# Patient Record
Sex: Female | Born: 1940 | Race: Black or African American | Hispanic: No | Marital: Married | Smoking: Never smoker
Health system: Southern US, Community
[De-identification: ages and names within clinical notes are randomized; demographics above are authoritative.]

## PROBLEM LIST (undated history)

## (undated) DIAGNOSIS — K5792 Diverticulitis of intestine, part unspecified, without perforation or abscess without bleeding: Secondary | ICD-10-CM

## (undated) DIAGNOSIS — I10 Essential (primary) hypertension: Secondary | ICD-10-CM

## (undated) DIAGNOSIS — Z8489 Family history of other specified conditions: Secondary | ICD-10-CM

## (undated) DIAGNOSIS — K219 Gastro-esophageal reflux disease without esophagitis: Secondary | ICD-10-CM

## (undated) DIAGNOSIS — G4733 Obstructive sleep apnea (adult) (pediatric): Secondary | ICD-10-CM

## (undated) DIAGNOSIS — T7840XA Allergy, unspecified, initial encounter: Secondary | ICD-10-CM

## (undated) DIAGNOSIS — D429 Neoplasm of uncertain behavior of meninges, unspecified: Secondary | ICD-10-CM

## (undated) DIAGNOSIS — H269 Unspecified cataract: Secondary | ICD-10-CM

## (undated) DIAGNOSIS — R739 Hyperglycemia, unspecified: Secondary | ICD-10-CM

## (undated) DIAGNOSIS — Z800 Family history of malignant neoplasm of digestive organs: Secondary | ICD-10-CM

## (undated) DIAGNOSIS — R002 Palpitations: Secondary | ICD-10-CM

## (undated) DIAGNOSIS — E0590 Thyrotoxicosis, unspecified without thyrotoxic crisis or storm: Secondary | ICD-10-CM

## (undated) DIAGNOSIS — E785 Hyperlipidemia, unspecified: Secondary | ICD-10-CM

## (undated) DIAGNOSIS — D420 Neoplasm of uncertain behavior of cerebral meninges: Secondary | ICD-10-CM

## (undated) DIAGNOSIS — R42 Dizziness and giddiness: Secondary | ICD-10-CM

## (undated) DIAGNOSIS — Z8719 Personal history of other diseases of the digestive system: Secondary | ICD-10-CM

## (undated) DIAGNOSIS — M1990 Unspecified osteoarthritis, unspecified site: Secondary | ICD-10-CM

## (undated) DIAGNOSIS — K279 Peptic ulcer, site unspecified, unspecified as acute or chronic, without hemorrhage or perforation: Secondary | ICD-10-CM

## (undated) DIAGNOSIS — F329 Major depressive disorder, single episode, unspecified: Secondary | ICD-10-CM

## (undated) DIAGNOSIS — R569 Unspecified convulsions: Secondary | ICD-10-CM

## (undated) DIAGNOSIS — G4730 Sleep apnea, unspecified: Secondary | ICD-10-CM

## (undated) DIAGNOSIS — F32A Depression, unspecified: Secondary | ICD-10-CM

## (undated) DIAGNOSIS — K635 Polyp of colon: Secondary | ICD-10-CM

## (undated) DIAGNOSIS — J189 Pneumonia, unspecified organism: Secondary | ICD-10-CM

## (undated) DIAGNOSIS — D421 Neoplasm of uncertain behavior of spinal meninges: Secondary | ICD-10-CM

## (undated) DIAGNOSIS — F419 Anxiety disorder, unspecified: Secondary | ICD-10-CM

## (undated) DIAGNOSIS — Z8041 Family history of malignant neoplasm of ovary: Secondary | ICD-10-CM

## (undated) DIAGNOSIS — R350 Frequency of micturition: Secondary | ICD-10-CM

## (undated) HISTORY — PX: ABDOMINAL HYSTERECTOMY: SHX81

## (undated) HISTORY — PX: CATARACT EXTRACTION: SUR2

## (undated) HISTORY — DX: Depression, unspecified: F32.A

## (undated) HISTORY — DX: Allergy, unspecified, initial encounter: T78.40XA

## (undated) HISTORY — PX: BACK SURGERY: SHX140

## (undated) HISTORY — DX: Neoplasm of uncertain behavior of spinal meninges: D42.1

## (undated) HISTORY — DX: Neoplasm of uncertain behavior of cerebral meninges: D42.0

## (undated) HISTORY — DX: Hyperlipidemia, unspecified: E78.5

## (undated) HISTORY — PX: WRIST SURGERY: SHX841

## (undated) HISTORY — DX: Neoplasm of uncertain behavior of meninges, unspecified: D42.9

## (undated) HISTORY — DX: Family history of malignant neoplasm of ovary: Z80.41

## (undated) HISTORY — DX: Unspecified cataract: H26.9

## (undated) HISTORY — DX: Family history of malignant neoplasm of digestive organs: Z80.0

## (undated) HISTORY — DX: Sleep apnea, unspecified: G47.30

## (undated) HISTORY — PX: TUBAL LIGATION: SHX77

## (undated) HISTORY — DX: Gastro-esophageal reflux disease without esophagitis: K21.9

## (undated) HISTORY — DX: Hyperglycemia, unspecified: R73.9

## (undated) HISTORY — DX: Essential (primary) hypertension: I10

## (undated) HISTORY — DX: Peptic ulcer, site unspecified, unspecified as acute or chronic, without hemorrhage or perforation: K27.9

## (undated) HISTORY — PX: DILATION AND CURETTAGE OF UTERUS: SHX78

## (undated) HISTORY — DX: Polyp of colon: K63.5

## (undated) HISTORY — DX: Obstructive sleep apnea (adult) (pediatric): G47.33

## (undated) HISTORY — DX: Major depressive disorder, single episode, unspecified: F32.9

## (undated) HISTORY — DX: Palpitations: R00.2

## (undated) HISTORY — PX: TONSILLECTOMY: SUR1361

---

## 1997-09-17 ENCOUNTER — Ambulatory Visit (HOSPITAL_COMMUNITY): Admission: RE | Admit: 1997-09-17 | Discharge: 1997-09-17 | Payer: Self-pay

## 1998-09-20 ENCOUNTER — Encounter: Payer: Self-pay | Admitting: Obstetrics and Gynecology

## 1998-09-20 ENCOUNTER — Ambulatory Visit (HOSPITAL_COMMUNITY): Admission: RE | Admit: 1998-09-20 | Discharge: 1998-09-20 | Payer: Self-pay | Admitting: Obstetrics and Gynecology

## 1999-03-19 ENCOUNTER — Ambulatory Visit (HOSPITAL_COMMUNITY): Admission: RE | Admit: 1999-03-19 | Discharge: 1999-03-19 | Payer: Self-pay | Admitting: Orthopedic Surgery

## 1999-03-19 ENCOUNTER — Encounter: Payer: Self-pay | Admitting: Orthopedic Surgery

## 1999-04-17 ENCOUNTER — Encounter: Admission: RE | Admit: 1999-04-17 | Discharge: 1999-04-17 | Payer: Self-pay | Admitting: Neurosurgery

## 1999-09-22 ENCOUNTER — Encounter: Payer: Self-pay | Admitting: Obstetrics and Gynecology

## 1999-09-22 ENCOUNTER — Ambulatory Visit (HOSPITAL_COMMUNITY): Admission: RE | Admit: 1999-09-22 | Discharge: 1999-09-22 | Payer: Self-pay | Admitting: Internal Medicine

## 2000-05-17 ENCOUNTER — Ambulatory Visit (HOSPITAL_COMMUNITY): Admission: RE | Admit: 2000-05-17 | Discharge: 2000-05-17 | Payer: Self-pay | Admitting: Orthopedic Surgery

## 2000-05-17 ENCOUNTER — Encounter: Payer: Self-pay | Admitting: Orthopedic Surgery

## 2000-06-20 ENCOUNTER — Encounter: Admission: RE | Admit: 2000-06-20 | Discharge: 2000-08-21 | Payer: Self-pay | Admitting: Orthopedic Surgery

## 2000-08-22 ENCOUNTER — Encounter: Admission: RE | Admit: 2000-08-22 | Discharge: 2000-09-16 | Payer: Self-pay | Admitting: Orthopedic Surgery

## 2000-09-09 ENCOUNTER — Encounter: Payer: Self-pay | Admitting: Orthopedic Surgery

## 2000-09-09 ENCOUNTER — Ambulatory Visit (HOSPITAL_COMMUNITY): Admission: RE | Admit: 2000-09-09 | Discharge: 2000-09-09 | Payer: Self-pay | Admitting: Orthopedic Surgery

## 2000-09-24 ENCOUNTER — Ambulatory Visit (HOSPITAL_COMMUNITY): Admission: RE | Admit: 2000-09-24 | Discharge: 2000-09-24 | Payer: Self-pay | Admitting: Obstetrics and Gynecology

## 2000-09-24 ENCOUNTER — Encounter: Payer: Self-pay | Admitting: Obstetrics and Gynecology

## 2000-10-01 ENCOUNTER — Encounter: Admission: RE | Admit: 2000-10-01 | Discharge: 2000-10-01 | Payer: Self-pay | Admitting: Neurosurgery

## 2000-12-01 ENCOUNTER — Ambulatory Visit (HOSPITAL_COMMUNITY): Admission: RE | Admit: 2000-12-01 | Discharge: 2000-12-01 | Payer: Self-pay | Admitting: Neurosurgery

## 2001-01-02 ENCOUNTER — Inpatient Hospital Stay (HOSPITAL_COMMUNITY): Admission: RE | Admit: 2001-01-02 | Discharge: 2001-01-06 | Payer: Self-pay | Admitting: Neurosurgery

## 2001-08-13 ENCOUNTER — Encounter: Admission: RE | Admit: 2001-08-13 | Discharge: 2001-10-21 | Payer: Self-pay

## 2001-08-28 ENCOUNTER — Observation Stay (HOSPITAL_COMMUNITY): Admission: RE | Admit: 2001-08-28 | Discharge: 2001-08-29 | Payer: Self-pay | Admitting: Neurosurgery

## 2001-09-26 ENCOUNTER — Encounter: Payer: Self-pay | Admitting: Obstetrics and Gynecology

## 2001-09-26 ENCOUNTER — Ambulatory Visit (HOSPITAL_COMMUNITY): Admission: RE | Admit: 2001-09-26 | Discharge: 2001-09-26 | Payer: Self-pay | Admitting: Obstetrics and Gynecology

## 2002-09-30 ENCOUNTER — Encounter: Payer: Self-pay | Admitting: Obstetrics and Gynecology

## 2002-09-30 ENCOUNTER — Ambulatory Visit (HOSPITAL_COMMUNITY): Admission: RE | Admit: 2002-09-30 | Discharge: 2002-09-30 | Payer: Self-pay | Admitting: Obstetrics and Gynecology

## 2002-10-05 ENCOUNTER — Emergency Department (HOSPITAL_COMMUNITY): Admission: EM | Admit: 2002-10-05 | Discharge: 2002-10-05 | Payer: Self-pay | Admitting: Emergency Medicine

## 2002-10-26 ENCOUNTER — Encounter: Payer: Self-pay | Admitting: Cardiovascular Disease

## 2002-10-26 ENCOUNTER — Ambulatory Visit (HOSPITAL_COMMUNITY): Admission: RE | Admit: 2002-10-26 | Discharge: 2002-10-26 | Payer: Self-pay | Admitting: Cardiovascular Disease

## 2003-09-16 ENCOUNTER — Encounter: Admission: RE | Admit: 2003-09-16 | Discharge: 2003-09-16 | Payer: Self-pay | Admitting: Family Medicine

## 2003-10-01 ENCOUNTER — Ambulatory Visit (HOSPITAL_COMMUNITY): Admission: RE | Admit: 2003-10-01 | Discharge: 2003-10-01 | Payer: Self-pay | Admitting: Family Medicine

## 2004-11-03 ENCOUNTER — Ambulatory Visit (HOSPITAL_COMMUNITY): Admission: RE | Admit: 2004-11-03 | Discharge: 2004-11-03 | Payer: Self-pay | Admitting: Family Medicine

## 2005-05-31 ENCOUNTER — Ambulatory Visit: Payer: Self-pay | Admitting: Emergency Medicine

## 2005-06-06 ENCOUNTER — Ambulatory Visit: Payer: Self-pay | Admitting: Emergency Medicine

## 2005-06-22 ENCOUNTER — Ambulatory Visit: Payer: Self-pay | Admitting: Emergency Medicine

## 2005-07-11 ENCOUNTER — Ambulatory Visit (HOSPITAL_BASED_OUTPATIENT_CLINIC_OR_DEPARTMENT_OTHER): Admission: RE | Admit: 2005-07-11 | Discharge: 2005-07-11 | Payer: Self-pay | Admitting: Emergency Medicine

## 2005-07-26 ENCOUNTER — Ambulatory Visit: Payer: Self-pay | Admitting: Pulmonary Disease

## 2005-08-06 ENCOUNTER — Ambulatory Visit: Payer: Self-pay | Admitting: Emergency Medicine

## 2005-08-27 ENCOUNTER — Encounter: Admission: RE | Admit: 2005-08-27 | Discharge: 2005-09-07 | Payer: Self-pay | Admitting: Family Medicine

## 2005-09-04 ENCOUNTER — Ambulatory Visit (HOSPITAL_BASED_OUTPATIENT_CLINIC_OR_DEPARTMENT_OTHER): Admission: RE | Admit: 2005-09-04 | Discharge: 2005-09-04 | Payer: Self-pay | Admitting: Emergency Medicine

## 2005-09-12 ENCOUNTER — Ambulatory Visit: Payer: Self-pay | Admitting: Pulmonary Disease

## 2005-10-05 ENCOUNTER — Ambulatory Visit: Payer: Self-pay | Admitting: Emergency Medicine

## 2005-11-05 ENCOUNTER — Ambulatory Visit (HOSPITAL_COMMUNITY): Admission: RE | Admit: 2005-11-05 | Discharge: 2005-11-05 | Payer: Self-pay | Admitting: Family Medicine

## 2005-11-14 ENCOUNTER — Emergency Department (HOSPITAL_COMMUNITY): Admission: EM | Admit: 2005-11-14 | Discharge: 2005-11-14 | Payer: Self-pay | Admitting: Emergency Medicine

## 2005-11-20 ENCOUNTER — Ambulatory Visit (HOSPITAL_COMMUNITY): Admission: RE | Admit: 2005-11-20 | Discharge: 2005-11-20 | Payer: Self-pay | Admitting: Orthopedic Surgery

## 2006-01-29 ENCOUNTER — Encounter: Admission: RE | Admit: 2006-01-29 | Discharge: 2006-04-29 | Payer: Self-pay | Admitting: Orthopedic Surgery

## 2006-05-16 ENCOUNTER — Ambulatory Visit: Payer: Self-pay | Admitting: Emergency Medicine

## 2006-10-08 ENCOUNTER — Encounter: Admission: RE | Admit: 2006-10-08 | Discharge: 2006-10-08 | Payer: Self-pay | Admitting: Family Medicine

## 2006-10-30 ENCOUNTER — Encounter: Admission: RE | Admit: 2006-10-30 | Discharge: 2006-10-30 | Payer: Self-pay | Admitting: Family Medicine

## 2006-11-08 ENCOUNTER — Ambulatory Visit (HOSPITAL_COMMUNITY): Admission: RE | Admit: 2006-11-08 | Discharge: 2006-11-08 | Payer: Self-pay | Admitting: Family Medicine

## 2006-11-08 DIAGNOSIS — G4733 Obstructive sleep apnea (adult) (pediatric): Secondary | ICD-10-CM | POA: Insufficient documentation

## 2006-11-08 DIAGNOSIS — R000 Tachycardia, unspecified: Secondary | ICD-10-CM | POA: Insufficient documentation

## 2006-11-08 DIAGNOSIS — J309 Allergic rhinitis, unspecified: Secondary | ICD-10-CM | POA: Insufficient documentation

## 2006-11-08 DIAGNOSIS — J383 Other diseases of vocal cords: Secondary | ICD-10-CM | POA: Insufficient documentation

## 2006-11-08 DIAGNOSIS — E785 Hyperlipidemia, unspecified: Secondary | ICD-10-CM | POA: Insufficient documentation

## 2006-11-08 DIAGNOSIS — I10 Essential (primary) hypertension: Secondary | ICD-10-CM | POA: Insufficient documentation

## 2006-11-08 DIAGNOSIS — J45909 Unspecified asthma, uncomplicated: Secondary | ICD-10-CM | POA: Insufficient documentation

## 2007-02-17 ENCOUNTER — Encounter: Admission: RE | Admit: 2007-02-17 | Discharge: 2007-02-17 | Payer: Self-pay | Admitting: Neurosurgery

## 2007-06-26 ENCOUNTER — Encounter: Admission: RE | Admit: 2007-06-26 | Discharge: 2007-06-26 | Payer: Self-pay | Admitting: Neurosurgery

## 2007-11-10 ENCOUNTER — Encounter: Payer: Self-pay | Admitting: Family Medicine

## 2007-11-10 ENCOUNTER — Ambulatory Visit (HOSPITAL_COMMUNITY): Admission: RE | Admit: 2007-11-10 | Discharge: 2007-11-10 | Payer: Self-pay | Admitting: Family Medicine

## 2008-04-12 ENCOUNTER — Ambulatory Visit: Payer: Self-pay | Admitting: Family Medicine

## 2008-06-09 ENCOUNTER — Ambulatory Visit: Payer: Self-pay | Admitting: Family Medicine

## 2008-06-09 DIAGNOSIS — M26609 Unspecified temporomandibular joint disorder, unspecified side: Secondary | ICD-10-CM | POA: Insufficient documentation

## 2008-06-10 ENCOUNTER — Encounter: Payer: Self-pay | Admitting: Family Medicine

## 2008-06-11 ENCOUNTER — Telehealth: Payer: Self-pay | Admitting: Family Medicine

## 2008-06-24 ENCOUNTER — Encounter: Payer: Self-pay | Admitting: Family Medicine

## 2008-06-24 DIAGNOSIS — F329 Major depressive disorder, single episode, unspecified: Secondary | ICD-10-CM | POA: Insufficient documentation

## 2008-06-24 DIAGNOSIS — K219 Gastro-esophageal reflux disease without esophagitis: Secondary | ICD-10-CM | POA: Insufficient documentation

## 2008-06-24 DIAGNOSIS — F3289 Other specified depressive episodes: Secondary | ICD-10-CM | POA: Insufficient documentation

## 2008-06-24 DIAGNOSIS — K279 Peptic ulcer, site unspecified, unspecified as acute or chronic, without hemorrhage or perforation: Secondary | ICD-10-CM | POA: Insufficient documentation

## 2008-07-07 ENCOUNTER — Encounter: Admission: RE | Admit: 2008-07-07 | Discharge: 2008-07-07 | Payer: Self-pay | Admitting: Neurosurgery

## 2008-07-20 ENCOUNTER — Encounter: Payer: Self-pay | Admitting: Family Medicine

## 2008-08-13 ENCOUNTER — Telehealth: Payer: Self-pay | Admitting: Family Medicine

## 2008-08-17 ENCOUNTER — Telehealth: Payer: Self-pay | Admitting: Family Medicine

## 2008-08-18 ENCOUNTER — Encounter: Payer: Self-pay | Admitting: Family Medicine

## 2008-08-19 ENCOUNTER — Telehealth: Payer: Self-pay | Admitting: Family Medicine

## 2008-08-20 ENCOUNTER — Encounter: Payer: Self-pay | Admitting: Family Medicine

## 2008-08-20 ENCOUNTER — Telehealth: Payer: Self-pay | Admitting: Family Medicine

## 2008-08-22 ENCOUNTER — Encounter (INDEPENDENT_AMBULATORY_CARE_PROVIDER_SITE_OTHER): Payer: Self-pay | Admitting: Neurosurgery

## 2008-08-22 HISTORY — PX: BRAIN MENINGIOMA EXCISION: SHX576

## 2008-08-23 ENCOUNTER — Inpatient Hospital Stay (HOSPITAL_COMMUNITY): Admission: RE | Admit: 2008-08-23 | Discharge: 2008-08-26 | Payer: Self-pay | Admitting: Neurosurgery

## 2008-09-03 ENCOUNTER — Encounter: Payer: Self-pay | Admitting: Family Medicine

## 2008-09-06 ENCOUNTER — Ambulatory Visit: Payer: Self-pay | Admitting: Family Medicine

## 2008-09-09 ENCOUNTER — Telehealth: Payer: Self-pay | Admitting: Family Medicine

## 2008-10-20 ENCOUNTER — Telehealth (INDEPENDENT_AMBULATORY_CARE_PROVIDER_SITE_OTHER): Payer: Self-pay | Admitting: *Deleted

## 2008-11-11 ENCOUNTER — Ambulatory Visit (HOSPITAL_COMMUNITY): Admission: RE | Admit: 2008-11-11 | Discharge: 2008-11-11 | Payer: Self-pay | Admitting: Family Medicine

## 2008-11-12 ENCOUNTER — Telehealth: Payer: Self-pay | Admitting: Family Medicine

## 2008-11-16 ENCOUNTER — Telehealth: Payer: Self-pay | Admitting: Family Medicine

## 2008-11-16 ENCOUNTER — Encounter (INDEPENDENT_AMBULATORY_CARE_PROVIDER_SITE_OTHER): Payer: Self-pay | Admitting: *Deleted

## 2008-11-22 ENCOUNTER — Encounter: Admission: RE | Admit: 2008-11-22 | Discharge: 2008-11-22 | Payer: Self-pay | Admitting: Family Medicine

## 2008-11-22 LAB — CONVERTED CEMR LAB: Pap Smear: NORMAL

## 2008-12-06 ENCOUNTER — Ambulatory Visit: Payer: Self-pay | Admitting: Family Medicine

## 2008-12-07 ENCOUNTER — Encounter: Payer: Self-pay | Admitting: Family Medicine

## 2009-01-03 ENCOUNTER — Encounter: Payer: Self-pay | Admitting: Family Medicine

## 2009-01-10 ENCOUNTER — Telehealth: Payer: Self-pay | Admitting: Family Medicine

## 2009-01-31 ENCOUNTER — Telehealth: Payer: Self-pay | Admitting: Family Medicine

## 2009-02-13 ENCOUNTER — Emergency Department (HOSPITAL_COMMUNITY): Admission: EM | Admit: 2009-02-13 | Discharge: 2009-02-13 | Payer: Self-pay | Admitting: Emergency Medicine

## 2009-02-15 ENCOUNTER — Encounter: Admission: RE | Admit: 2009-02-15 | Discharge: 2009-02-15 | Payer: Self-pay | Admitting: Neurosurgery

## 2009-02-22 ENCOUNTER — Encounter: Payer: Self-pay | Admitting: Family Medicine

## 2009-03-21 ENCOUNTER — Telehealth: Payer: Self-pay | Admitting: Family Medicine

## 2009-04-07 ENCOUNTER — Telehealth: Payer: Self-pay | Admitting: Family Medicine

## 2009-04-11 ENCOUNTER — Ambulatory Visit: Payer: Self-pay | Admitting: Family Medicine

## 2009-04-12 ENCOUNTER — Encounter (INDEPENDENT_AMBULATORY_CARE_PROVIDER_SITE_OTHER): Payer: Self-pay | Admitting: *Deleted

## 2009-04-14 LAB — CONVERTED CEMR LAB
ALT: 16 units/L (ref 0–35)
AST: 26 units/L (ref 0–37)
Albumin: 3.8 g/dL (ref 3.5–5.2)
Alkaline Phosphatase: 72 units/L (ref 39–117)
BUN: 13 mg/dL (ref 6–23)
Basophils Absolute: 0 10*3/uL (ref 0.0–0.1)
Basophils Relative: 0.5 % (ref 0.0–3.0)
Bilirubin, Direct: 0.1 mg/dL (ref 0.0–0.3)
CO2: 31 meq/L (ref 19–32)
Calcium: 8.9 mg/dL (ref 8.4–10.5)
Chloride: 106 meq/L (ref 96–112)
Cholesterol: 311 mg/dL — ABNORMAL HIGH (ref 0–200)
Creatinine, Ser: 1.1 mg/dL (ref 0.4–1.2)
Direct LDL: 220.6 mg/dL
Eosinophils Absolute: 0.1 10*3/uL (ref 0.0–0.7)
Eosinophils Relative: 2.1 % (ref 0.0–5.0)
GFR calc non Af Amer: 63.84 mL/min (ref >60)
Glucose, Bld: 114 mg/dL — ABNORMAL HIGH (ref 70–99)
HCT: 36.4 % (ref 36.0–46.0)
HDL: 84.3 mg/dL (ref >39.00)
Hemoglobin: 11.7 g/dL — ABNORMAL LOW (ref 12.0–15.0)
Lymphocytes Relative: 40.4 % (ref 12.0–46.0)
Lymphs Abs: 1.3 10*3/uL (ref 0.7–4.0)
MCHC: 32.3 g/dL (ref 30.0–36.0)
MCV: 83.4 fL (ref 78.0–100.0)
Monocytes Absolute: 0.2 10*3/uL (ref 0.1–1.0)
Monocytes Relative: 6.4 % (ref 3.0–12.0)
Neutro Abs: 1.6 10*3/uL (ref 1.4–7.7)
Neutrophils Relative %: 50.6 % (ref 43.0–77.0)
Platelets: 160 10*3/uL (ref 150.0–400.0)
Potassium: 3.2 meq/L — ABNORMAL LOW (ref 3.5–5.1)
RBC: 4.36 M/uL (ref 3.87–5.11)
RDW: 13.5 % (ref 11.5–14.6)
Sodium: 142 meq/L (ref 135–145)
TSH: 1.28 microintl units/mL (ref 0.35–5.50)
Total Bilirubin: 0.9 mg/dL (ref 0.3–1.2)
Total CHOL/HDL Ratio: 4
Total Protein: 6.3 g/dL (ref 6.0–8.3)
Triglycerides: 101 mg/dL (ref 0.0–149.0)
VLDL: 20.2 mg/dL (ref 0.0–40.0)
WBC: 3.2 10*3/uL — ABNORMAL LOW (ref 4.5–10.5)

## 2009-04-18 ENCOUNTER — Telehealth: Payer: Self-pay | Admitting: Family Medicine

## 2009-04-21 ENCOUNTER — Encounter (INDEPENDENT_AMBULATORY_CARE_PROVIDER_SITE_OTHER): Payer: Self-pay | Admitting: *Deleted

## 2009-04-25 ENCOUNTER — Ambulatory Visit: Payer: Self-pay | Admitting: Gastroenterology

## 2009-04-27 ENCOUNTER — Telehealth (INDEPENDENT_AMBULATORY_CARE_PROVIDER_SITE_OTHER): Payer: Self-pay | Admitting: *Deleted

## 2009-04-28 ENCOUNTER — Encounter: Payer: Self-pay | Admitting: Family Medicine

## 2009-05-02 ENCOUNTER — Ambulatory Visit: Payer: Self-pay | Admitting: Family Medicine

## 2009-05-02 DIAGNOSIS — E876 Hypokalemia: Secondary | ICD-10-CM | POA: Insufficient documentation

## 2009-05-03 ENCOUNTER — Telehealth: Payer: Self-pay | Admitting: Family Medicine

## 2009-05-04 LAB — CONVERTED CEMR LAB
BUN: 17 mg/dL (ref 6–23)
Basophils Absolute: 0 10*3/uL (ref 0.0–0.1)
Basophils Relative: 0.5 % (ref 0.0–3.0)
CO2: 33 meq/L — ABNORMAL HIGH (ref 19–32)
Calcium: 9 mg/dL (ref 8.4–10.5)
Chloride: 98 meq/L (ref 96–112)
Creatinine, Ser: 1.3 mg/dL — ABNORMAL HIGH (ref 0.4–1.2)
Eosinophils Absolute: 0.1 10*3/uL (ref 0.0–0.7)
Eosinophils Relative: 1 % (ref 0.0–5.0)
GFR calc non Af Amer: 52.63 mL/min (ref >60)
Glucose, Bld: 127 mg/dL — ABNORMAL HIGH (ref 70–99)
HCT: 40.4 % (ref 36.0–46.0)
Hemoglobin: 13.5 g/dL (ref 12.0–15.0)
Lymphocytes Relative: 35 % (ref 12.0–46.0)
Lymphs Abs: 2.5 10*3/uL (ref 0.7–4.0)
MCHC: 33.4 g/dL (ref 30.0–36.0)
MCV: 82.3 fL (ref 78.0–100.0)
Monocytes Absolute: 0.4 10*3/uL (ref 0.1–1.0)
Monocytes Relative: 5 % (ref 3.0–12.0)
Neutro Abs: 4.1 10*3/uL (ref 1.4–7.7)
Neutrophils Relative %: 58.5 % (ref 43.0–77.0)
Platelets: 253 10*3/uL (ref 150.0–400.0)
Potassium: 3.9 meq/L (ref 3.5–5.1)
RBC: 4.91 M/uL (ref 3.87–5.11)
RDW: 14 % (ref 11.5–14.6)
Sodium: 141 meq/L (ref 135–145)
WBC: 7.1 10*3/uL (ref 4.5–10.5)

## 2009-05-05 ENCOUNTER — Ambulatory Visit: Payer: Self-pay | Admitting: Gastroenterology

## 2009-05-05 HISTORY — PX: COLONOSCOPY: SHX174

## 2009-05-05 LAB — HM COLONOSCOPY

## 2009-05-20 ENCOUNTER — Ambulatory Visit: Payer: Self-pay | Admitting: Family Medicine

## 2009-05-20 DIAGNOSIS — E119 Type 2 diabetes mellitus without complications: Secondary | ICD-10-CM | POA: Insufficient documentation

## 2009-05-20 LAB — CONVERTED CEMR LAB: Blood Glucose, Fingerstick: 120

## 2009-06-27 ENCOUNTER — Telehealth: Payer: Self-pay | Admitting: Family Medicine

## 2009-07-18 ENCOUNTER — Encounter: Payer: Self-pay | Admitting: Family Medicine

## 2009-08-18 ENCOUNTER — Ambulatory Visit: Payer: Self-pay | Admitting: Family Medicine

## 2009-08-18 LAB — HM DIABETES FOOT EXAM

## 2009-08-18 LAB — CONVERTED CEMR LAB: Hgb A1c MFr Bld: 7.3 % — ABNORMAL HIGH (ref 4.6–6.5)

## 2009-10-16 ENCOUNTER — Emergency Department (HOSPITAL_COMMUNITY)
Admission: EM | Admit: 2009-10-16 | Discharge: 2009-10-16 | Payer: Self-pay | Source: Home / Self Care | Admitting: Emergency Medicine

## 2009-11-02 ENCOUNTER — Ambulatory Visit: Payer: Self-pay | Admitting: Family Medicine

## 2009-11-02 DIAGNOSIS — R42 Dizziness and giddiness: Secondary | ICD-10-CM | POA: Insufficient documentation

## 2009-11-04 LAB — CONVERTED CEMR LAB
BUN: 17 mg/dL (ref 6–23)
Basophils Absolute: 0 10*3/uL (ref 0.0–0.1)
Basophils Relative: 0.6 % (ref 0.0–3.0)
CO2: 29 meq/L (ref 19–32)
Calcium: 9.1 mg/dL (ref 8.4–10.5)
Chloride: 100 meq/L (ref 96–112)
Creatinine, Ser: 1.2 mg/dL (ref 0.4–1.2)
Eosinophils Absolute: 0.1 10*3/uL (ref 0.0–0.7)
Eosinophils Relative: 1.8 % (ref 0.0–5.0)
GFR calc non Af Amer: 57.09 mL/min (ref >60)
Glucose, Bld: 104 mg/dL — ABNORMAL HIGH (ref 70–99)
HCT: 37.5 % (ref 36.0–46.0)
Hemoglobin: 12.6 g/dL (ref 12.0–15.0)
Hgb A1c MFr Bld: 7.6 % — ABNORMAL HIGH (ref 4.6–6.5)
Lymphocytes Relative: 40 % (ref 12.0–46.0)
Lymphs Abs: 2 10*3/uL (ref 0.7–4.0)
MCHC: 33.5 g/dL (ref 30.0–36.0)
MCV: 82.4 fL (ref 78.0–100.0)
Monocytes Absolute: 0.3 10*3/uL (ref 0.1–1.0)
Monocytes Relative: 6.2 % (ref 3.0–12.0)
Neutro Abs: 2.5 10*3/uL (ref 1.4–7.7)
Neutrophils Relative %: 51.4 % (ref 43.0–77.0)
Platelets: 196 10*3/uL (ref 150.0–400.0)
Potassium: 3 meq/L — ABNORMAL LOW (ref 3.5–5.1)
RBC: 4.55 M/uL (ref 3.87–5.11)
RDW: 13.8 % (ref 11.5–14.6)
Sodium: 138 meq/L (ref 135–145)
TSH: 2.08 microintl units/mL (ref 0.35–5.50)
WBC: 4.9 10*3/uL (ref 4.5–10.5)

## 2009-11-07 ENCOUNTER — Telehealth: Payer: Self-pay | Admitting: Family Medicine

## 2009-11-15 ENCOUNTER — Ambulatory Visit (HOSPITAL_COMMUNITY): Admission: RE | Admit: 2009-11-15 | Discharge: 2009-11-15 | Payer: Self-pay | Admitting: Family Medicine

## 2009-11-15 LAB — HM MAMMOGRAPHY

## 2009-11-16 ENCOUNTER — Ambulatory Visit: Payer: Self-pay | Admitting: Family Medicine

## 2009-11-16 LAB — CONVERTED CEMR LAB
BUN: 15 mg/dL (ref 6–23)
CO2: 28 meq/L (ref 19–32)
Calcium: 8.9 mg/dL (ref 8.4–10.5)
Chloride: 98 meq/L (ref 96–112)
Creatinine, Ser: 1.2 mg/dL (ref 0.4–1.2)
GFR calc non Af Amer: 56.01 mL/min (ref >60)
Glucose, Bld: 117 mg/dL — ABNORMAL HIGH (ref 70–99)
Potassium: 3.5 meq/L (ref 3.5–5.1)
Sodium: 137 meq/L (ref 135–145)

## 2010-01-12 ENCOUNTER — Telehealth: Payer: Self-pay | Admitting: Family Medicine

## 2010-01-19 ENCOUNTER — Encounter: Payer: Self-pay | Admitting: Family Medicine

## 2010-01-24 ENCOUNTER — Telehealth: Payer: Self-pay | Admitting: Family Medicine

## 2010-01-31 ENCOUNTER — Encounter: Payer: Self-pay | Admitting: Family Medicine

## 2010-02-03 ENCOUNTER — Encounter
Admission: RE | Admit: 2010-02-03 | Discharge: 2010-02-03 | Payer: Self-pay | Source: Home / Self Care | Attending: Neurosurgery | Admitting: Neurosurgery

## 2010-02-11 ENCOUNTER — Encounter: Payer: Self-pay | Admitting: Family Medicine

## 2010-02-23 NOTE — Medication Information (Signed)
Summary: Order for Diabetic Testing Supplies  Order for Diabetic Testing Supplies   Imported By: Maryln Gottron 07/21/2009 12:51:34  _____________________________________________________________________  External Attachment:    Type:   Image     Comment:   External Document

## 2010-02-23 NOTE — Letter (Signed)
Summary: [facility]  [facility]   Imported By: Maryln Gottron 03/04/2009 15:44:41  _____________________________________________________________________  External Attachment:    Type:   Image     Comment:   External Document

## 2010-02-23 NOTE — Progress Notes (Signed)
Summary: Effexor XR 30 day request, FYI  Phone Note Call from Patient   Caller: Patient Call For: Evelena Peat MD Summary of Call: Pt needs Effexor 75 mg XR sent to [facility] in [place].  Her mail order has not arrived.  Please send in one month supply. Pt #  [id number] Initial call taken by: Lynann Beaver CMA,  August 20, 2008 8:20 AM  Follow-up for Phone Call        Rx for 30 days sent electronically, pt informed.  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 Follow-up by: Sid Falcon LPN,  August 20, 2008 12:15 PM  Additional Follow-up for Phone Call Additional follow up Details #1::        noted Additional Follow-up by: Evelena Peat MD,  August 20, 2008 1:09 PM    Prescriptions: EFFEXOR XR 75 MG XR24H-CAP (VENLAFAXINE HCL) once daily  #30 x 0   Entered by:   Sid Falcon LPN   Authorized by:   Evelena Peat MD   Signed by:   Sid Falcon LPN on 44/01/270   Method used:   Electronically to        [facility]* (retail)       [address]       [place], [place]  [address]       Ph: [phone]       Fax: [phone]   RxID:   [id number]

## 2010-02-23 NOTE — Procedures (Signed)
Summary: Colonoscopy  Patient: Dawn Salas Note: All result statuses are Final unless otherwise noted.  Tests: (1) Colonoscopy (COL)   COL Colonoscopy           DONE     [facility]     [address]     [place], [place]  [address]           COLONOSCOPY PROCEDURE REPORT           PATIENT:  Dawn Salas, Dawn Salas  MR#:  [id number]     BIRTHDATE:  1940/10/30, 68 yrs. old  GENDER:  female           ENDOSCOPIST:  Barbette Hair. Arlyce Dice, MD     Referred by:           PROCEDURE DATE:  05/05/2009     PROCEDURE:  Diagnostic Colonoscopy     ASA CLASS:  Class II     INDICATIONS:  1) Routine Risk Screening           MEDICATIONS:   Fentanyl 100 mcg IV, Versed 9 mg IV           DESCRIPTION OF PROCEDURE:   After the risks benefits and     alternatives of the procedure were thoroughly explained, informed     consent was obtained.  Digital rectal exam was performed and     revealed no abnormalities.   The LB [id number] endoscope     was introduced through the anus and advanced to the cecum, which     was identified by both the appendix and ileocecal valve, without     limitations.  The quality of the prep was excellent, using     MoviPrep.  The instrument was then slowly withdrawn as the colon     was fully examined.     <<PROCEDUREIMAGES>>           FINDINGS:  Moderate diverticulosis was found in the sigmoid colon     (see image1 and image13).  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   Retroflexed views in the rectum     revealed no abnormalities.    The time to cecum =  4.5  minutes.     The scope was then withdrawn (time =  7.0  min) from the patient     and the procedure completed.           COMPLICATIONS:  None           ENDOSCOPIC IMPRESSION:     1) Moderate diverticulosis in the sigmoid colon     2)  Melanosis     3) Otherwise normal examination     RECOMMENDATIONS:     1) Continue current colorectal screening recommendations for     "routine risk" patients with a repeat colonoscopy in 10 years.           REPEAT EXAM:  In 10 year(s) for Colonoscopy.           ______________________________     Barbette Hair. Arlyce Dice, MD           CC: Evelena Peat, MD           n.     Dawn DoctorBarbette Hair. Kaplan at 05/05/2009 08:43 AM  Dawn Salas, Dawn Salas, [id number]  Note: An exclamation mark (!) indicates a result that was not dispersed into the flowsheet. Document Creation Date: 05/05/2009 8:44 AM _______________________________________________________________________  (1) Order result status: Final Collection or observation date-time: 05/05/2009 08:32 Requested date-time:  Receipt date-time:  Reported date-time:  Referring Physician:   Ordering Physician: Melvia Heaps [phone]) Specimen Source:  Source: Launa Grill Order Number: [id number] Lab site:   Appended Document: Colonoscopy    Clinical Lists Changes  Observations: Added new observation of COLONNXTDUE: 04/2019 (05/05/2009 11:15)

## 2010-02-23 NOTE — Progress Notes (Signed)
Summary: pt needs partial refill of Avalide called into pharmacy.  Phone Note Call from Patient Call back at [phone] cell    Caller: Patient Call For: Evelena Peat MD Summary of Call: Pt called and said that she is completely out of Avalide 300/25mg . Pt said that [facility] will take 7 days to get the med. Pt is wanting Dr. Caryl Never to write a partial [facility] [place] the # is [phone]. Please call pt when this has been called in.  Initial call taken by: Lucy Antigua,  August 17, 2008 10:50 AM  Follow-up for Phone Call        Rx filled #30, RF 0, pt informed Follow-up by: Sid Falcon LPN,  August 17, 2008 12:29 PM    Prescriptions: AVALIDE 300-25 MG  TABS (IRBESARTAN-HYDROCHLOROTHIAZIDE) Take 1/2 tab once daily  #30 x 0   Entered by:   Sid Falcon LPN   Authorized by:   Evelena Peat MD   Signed by:   Sid Falcon LPN on 33/29/5188   Method used:   Electronically to        [facility]* (retail)       [address]       [place], [place]  [address]       Ph: [phone]       Fax: [phone]   RxID:   [id number]

## 2010-02-23 NOTE — Letter (Signed)
Summary: [facility]  [facility]   Imported By: Maryln Gottron 08/02/2008 12:54:18  _____________________________________________________________________  External Attachment:    Type:   Image     Comment:   External Document

## 2010-02-23 NOTE — Assessment & Plan Note (Signed)
Summary: ?upper respiratory inf/cough/fatigue/cjr   Vital Signs:  Patient profile:   70 year old female O2 Sat:      98 % on Room air Temp:     98.6 degrees F oral Pulse rate:   100 / minute Pulse rhythm:   regular BP sitting:   122 / 82  (left arm) Cuff size:   regular  Vitals Entered By: Sid Falcon LPN (December 06, 2008 1:53 PM)  O2 Flow:  Room air CC: URI, chest congestion, cough   History of Present Illness: Two-week history of upper respiratory symptoms. Now settled in her chest. Cough productive of yellow sputum. Occasional shortness of breath. No obvious wheezing. Past history reactive airway problems. Nonsmoker. Has had prior Pneumovax but not flu vaccine this year. Denies sore throat. No obvious fever.  Allergies: 1)  ! Penicillin 2)  ! Alka-Seltzer (Aspirin Effervescent) 3)  Morphine Sulfate (Morphine Sulfate)  Past History:  Past Medical History: Last updated: 09/06/2008 Allergic rhinitis Asthma Hyperlipidemia Hypertension Depression GERD Peptic ulcer disease  palpitations obstructive sleep apnea meningiomas brain  Review of Systems      See HPI  Physical Exam  General:  Well-developed,well-nourished,in no acute distress; alert,appropriate and cooperative throughout examination Ears:  External ear exam shows no significant lesions or deformities.  Otoscopic examination reveals clear canals, tympanic membranes are intact bilaterally without bulging, retraction, inflammation or discharge. Hearing is grossly normal bilaterally. Mouth:  Oral mucosa and oropharynx without lesions or exudates.  Teeth in good repair. Neck:  No deformities, masses, or tenderness noted. Lungs:  Normal respiratory effort, chest expands symmetrically. Lungs are clear to auscultation, no crackles or wheezes. Heart:  Normal rate and regular rhythm. S1 and S2 normal without gallop, murmur, click, rub or other extra sounds.   Impression & Recommendations:  Problem # 1:  ACUTE  BRONCHITIS (ICD-466.0) given duration of symptoms will treat with Zithromax. Flu vaccine will be given. Her updated medication list for this problem includes:    Albuterol 90 Mcg/act Aers (Albuterol) ..... Inhale 2 puffs daily    Azithromycin 250 Mg Tabs (Azithromycin) .Marland Kitchen..Marland Kitchen Two by mouth today then one by mouth once daily for 4 more days.  Complete Medication List: 1)  Toprol Xl 100 Mg Xr24h-tab (Metoprolol succinate) .... Once daily 2)  Furosemide 40 Mg Tabs (Furosemide) .... Take 1 tablet by mouth once a day 3)  Klor-con 10 10 Meq Tbcr (Potassium chloride) .... Take 1 tablet by mouth once a day 4)  Aciphex 20 Mg Tbec (Rabeprazole sodium) .... Take 1 tablet by mouth once a day 5)  Effexor Xr 75 Mg Xr24h-cap (Venlafaxine hcl) .... Once daily 6)  One-a-day Extras Antioxidant Caps (Multiple vitamins-minerals) .... Take 1 capsule by mouth once a day 7)  Rhinocort Aqua 32 Mcg/act Susp (Budesonide (nasal)) .... Take 2 sprays in each nostril once a day 8)  Vesicare 5 Mg Tabs (Solifenacin succinate) .... Take 1 tablet by mouth once a day 9)  Claritin 10 Mg Tabs (Loratadine) .... Take 1 tablet by mouth daily as needed 10)  Valium 5 Mg Tabs (Diazepam) .... Take 1/2 tablet daily 11)  Avalide 300-25 Mg Tabs (Irbesartan-hydrochlorothiazide) .... Take 1/2 tab once daily 12)  Albuterol 90 Mcg/act Aers (Albuterol) .... Inhale 2 puffs daily 13)  Hydrocodone-acetaminophen 10-500 Mg Tabs (Hydrocodone-acetaminophen) .... As needed 14)  Estrace 2 Mg Tabs (Estradiol) .... 1/2 tab in an, 1/2 tab in pm 15)  Naftin 1 % Gel (Naftifine hcl) .... Apply to affected rash two times  a day 16)  Meclizine Hcl 25 Mg Tabs (Meclizine hcl) .... Take one tab every 6 hours as needed for vertigo 17)  Azithromycin 250 Mg Tabs (Azithromycin) .... Two by mouth today then one by mouth once daily for 4 more days.  Other Orders: Flu Vaccine 3yrs + ([address]) Administration Flu vaccine - MCR (U0454)  Patient Instructions: 1)  Acute  Bronchitis symptoms for less then 10 days are not  helped by antibiotics. Take over the counter cough medications. Call if no improvement in 5-7 days, sooner if increasing cough, fever, or new symptoms ( shortness of breath, chest pain) .  Prescriptions: AZITHROMYCIN 250 MG TABS (AZITHROMYCIN) two by mouth today then one by mouth once daily for 4 more days.  #6 x 0   Entered and Authorized by:   Evelena Peat MD   Signed by:   Evelena Peat MD on 12/06/2008   Method used:   Electronically to        [facility]* (retail)       [address]       [place], [place]  [address]       Ph: [phone]       Fax: [phone]   RxID:   [id number]    Flu Vaccine Consent Questions     Do you have a history of severe allergic reactions to this vaccine? no    Any prior history of allergic reactions to egg and/or gelatin? no    Do you have a sensitivity to the preservative Thimersol? no    Do you have a past history of Guillan-Barre Syndrome? no    Do you currently have an acute febrile illness? no    Have you ever had a severe reaction to latex? no    Vaccine information given and explained to patient? yes    Are you currently pregnant? no    Lot Number:[id number]   Exp Date:07/21/2009   Site Given  Left Deltoid IMdflu

## 2010-02-23 NOTE — Progress Notes (Signed)
Summary: [facility] med refills  Phone Note From Pharmacy   Caller: [facility] Call For: Evelena Peat MD  Summary of Call: [facility] is calling for refills on Omeprazole, Avelide, Klor Con, and Effexor. Call [phone]  Ref # [id number] Initial call taken by: Lynann Beaver CMA,  August 13, 2008 9:58 AM  Follow-up for Phone Call        I do not see Omeprazole on pt med list.  What dose would you like her on? Sid Falcon LPN  August 13, 2008 12:30 PM   Additional Follow-up for Phone Call Additional follow up Details #1::        Patient previously has been on Aciphex.  Make sure pt not on Aciphex and Omeprazole dose should be 20 mg by mouth once daily.  OK to refill meds for 6 months. Additional Follow-up by: Evelena Peat MD,  August 13, 2008 1:21 PM    Additional Follow-up for Phone Call Additional follow up Details #2::    Called pt, she reports [facility] is the one changing her med, she lokes the Aciphex as it controls her acid reflux better.  Refille for Aciphex and other 3 meds done electronically for 90 days with 1 refill.  Pt informed Follow-up by: Sid Falcon LPN,  August 13, 2008 1:51 PM  Prescriptions: AVALIDE 300-25 MG  TABS (IRBESARTAN-HYDROCHLOROTHIAZIDE) Take 1/2 tab once daily  #90 x 1   Entered by:   Sid Falcon LPN   Authorized by:   Evelena Peat MD   Signed by:   Sid Falcon LPN on 57/84/6962   Method used:   Electronically to        [facility]* (mail-order)             ,          Ph: [phone]       Fax: [phone]   RxID:   [id number] EFFEXOR 75 MG  TABS (VENLAFAXINE HCL) Take 1 tablet by mouth once a day  #90 x 1   Entered by:   Sid Falcon LPN   Authorized by:   Evelena Peat MD   Signed by:   Sid Falcon LPN on 64/33/2951   Method used:   Electronically to        [facility]* (mail-order)             ,          Ph: [phone]       Fax: [phone]   RxID:   [id number] ACIPHEX 20 MG  TBEC (RABEPRAZOLE SODIUM) Take 1  tablet by mouth once a day  #90 x 1   Entered by:   Sid Falcon LPN   Authorized by:   Evelena Peat MD   Signed by:   Sid Falcon LPN on 62/83/1517   Method used:   Electronically to        [facility]* (mail-order)             ,          Ph: [phone]       Fax: [phone]   RxID:   [id number] KLOR-CON 10 10 MEQ  TBCR (POTASSIUM CHLORIDE) Take 1 tablet by mouth once a day  #90 x 1   Entered by:   Sid Falcon LPN   Authorized by:   Evelena Peat MD   Signed by:   Sid Falcon LPN on 69/67/8938   Method used:   Electronically to  [facility]* (mail-order)             ,          Ph: [phone]       Fax: [phone]   RxID:   [id number]

## 2010-02-23 NOTE — Progress Notes (Signed)
Summary: Insurance will not cover Rhinocort  Phone Note From Pharmacy   Caller: [facility] Call For: Peterson Mathey  Summary of Call: Faxed Rx from [facility], pt insurance does not cover Rhinocort Aqua 0.032.  Requesting permission to change to [facility] Initial call taken by: Sid Falcon LPN,  January 31, 2009 1:51 PM  Follow-up for Phone Call        OK to try generic Flonase 2 sprays per nostril once daily with 6 refills Follow-up by: Evelena Peat MD,  January 31, 2009 2:39 PM    New/Updated Medications: FLONASE 50 MCG/ACT SUSP (FLUTICASONE PROPIONATE) 2 sprays to each nostril daily Prescriptions: FLONASE 50 MCG/ACT SUSP (FLUTICASONE PROPIONATE) 2 sprays to each nostril daily  #1 x 11   Entered by:   Sid Falcon LPN   Authorized by:   Evelena Peat MD   Signed by:   Sid Falcon LPN on 16/10/9602   Method used:   Electronically to        [facility]* (retail)       [address]       [place], [place]  [address]       Ph: [phone]       Fax: [phone]   RxID:   [id number]

## 2010-02-23 NOTE — Progress Notes (Signed)
Summary: RX CLARIFICATION,  Metformin called in  Phone Note Call from Patient   Caller: Patient [phone] Summary of Call: Pt called to adv that she was told at the time of her last OV that a Rx was being sent to [facility] [place].... Pt cannot remember what medication was being sent but she states they have told her that nothing has been received?   Initial call taken by: Debbra Riding,  November 07, 2009 11:53 AM  Follow-up for Phone Call        Do you think she is calling about the Clonidine? Sid Falcon LPN  November 07, 2009 12:38 PM  Metformin is her new med.  Her gyn prescribed the clonidine, so we are not refilling that.  See if the metformin went through.  Follow-up by: Evelena Peat MD,  November 07, 2009 1:02 PM  Additional Follow-up for Phone Call Additional follow up Details #1::        Rx called in, pt informed Additional Follow-up by: Sid Falcon LPN,  November 07, 2009 2:32 PM    Prescriptions: METFORMIN HCL 500 MG TABS (METFORMIN HCL) one tab two times a day  #60 x 2   Entered by:   Sid Falcon LPN   Authorized by:   Evelena Peat MD   Signed by:   Sid Falcon LPN on 78/29/5621   Method used:   Electronically to        [facility]* (retail)       [address]       [place], [place]  [address]       Ph: [phone]       Fax: [phone]   RxID:   [id number]

## 2010-02-23 NOTE — Progress Notes (Signed)
Summary: Valtrex refill  Phone Note From Pharmacy   Caller: Patient Call For: Dawn Peat MD Caller: [facility] Call For: [place]  Summary of Call: Pt pharmacy sent refill request for Valtrex 1gm, take 2 tabs every 12 hours for 1 day, then as needed  I do not see this med on pt med list.  I called pt pharmacy, she has had Valtrex in the past, last fill was 06-21-2007.  She had 2 refills on it, however they were out of date. Initial call taken by: Sid Falcon LPN,  January 10, 2009 4:20 PM    New/Updated Medications: VALTREX 1 GM TABS (VALACYCLOVIR HCL) take 2 tabs by mouth every 12 hours for 1 day, and then as needed Prescriptions: VALTREX 1 GM TABS (VALACYCLOVIR HCL) take 2 tabs by mouth every 12 hours for 1 day, and then as needed  #30 x 1   Entered by:   Sid Falcon LPN   Authorized by:   Dawn Peat MD   Signed by:   Sid Falcon LPN on 27/25/3664   Method used:   Electronically to        [facility]* (retail)       [address]       [place], [place]  [address]       Ph: [phone]       Fax: [phone]   RxID:   [id number]

## 2010-02-23 NOTE — Assessment & Plan Note (Signed)
Summary: 3 mo rov/mm   Vital Signs:  Patient profile:   70 year old female Menstrual status:  postmenopausal Weight:      240 pounds Temp:     98.3 degrees F oral BP sitting:   128 / 90  (left arm) Cuff size:   large  Vitals Entered By: Sid Falcon LPN (August 18, 2009 8:20 AM) CC: 3 month follow-up, Hypertension Management Is Patient Diabetic? Yes Did you bring your meter with you today? Yes   History of Present Illness: Patient seen for the following.  Type 2 diabetes recently diagnosed with hemoglobin A1c 7.9%. By home monitor fastings generally ranging 108 to 140s range. Occasional high 160. Postprandial blood sugars around 160-180. No significant urine frequency or thirst. She has lost about 3 pounds since 4 pounds last visit. She made some dietary changes.   Other issue is she has few day history of brownish vaginal discharge with some associated odor. No itching. No pain. No dysuria. Denies any fever or chills.  Diabetes Management History:      She has not been enrolled in the "Diabetic Education Program".  She states understanding of dietary principles and is following her diet appropriately.  No sensory loss is reported.  Self foot exams are being performed.  She is checking home blood sugars.  She says that she is exercising.        Hypoglycemic symptoms are not occurring.  No hyperglycemic symptoms are reported.    Hypertension History:      She denies headache, chest pain, palpitations, dyspnea with exertion, orthopnea, PND, peripheral edema, visual symptoms, neurologic problems, syncope, and side effects from treatment.  She notes no problems with any antihypertensive medication side effects.        Positive major cardiovascular risk factors include female age 23 years old or older, diabetes, hyperlipidemia, and hypertension.  Negative major cardiovascular risk factors include non-tobacco-user status.     Preventive Screening-Counseling &  Management  Caffeine-Diet-Exercise     Does Patient Exercise: yes  Allergies: 1)  ! Penicillin 2)  ! Alka-Seltzer (Aspirin Effervescent) 3)  Morphine Sulfate (Morphine Sulfate)  Past History:  Past Medical History: Last updated: 05/20/2009 Allergic rhinitis Asthma Hyperlipidemia Hypertension Depression GERD Peptic ulcer disease  palpitations obstructive sleep apnea meningiomas brain Hyperglycemia 4/11 PMH reviewed for relevance  Social History: Does Patient Exercise:  yes  Review of Systems      See HPI  Physical Exam  General:  Well-developed,well-nourished,in no acute distress; alert,appropriate and cooperative throughout examination Eyes:  No corneal or conjunctival inflammation noted. EOMI. Perrla. Funduscopic exam benign, without hemorrhages, exudates or papilledema. Vision grossly normal. Ears:  External ear exam shows no significant lesions or deformities.  Otoscopic examination reveals clear canals, tympanic membranes are intact bilaterally without bulging, retraction, inflammation or discharge. Hearing is grossly normal bilaterally. Mouth:  Oral mucosa and oropharynx without lesions or exudates.  Teeth in good repair. Neck:  No deformities, masses, or tenderness noted. Lungs:  Normal respiratory effort, chest expands symmetrically. Lungs are clear to auscultation, no crackles or wheezes. Heart:  normal rate and regular rhythm.   Extremities:  no edema. Neurologic:  alert & oriented X3 and cranial nerves II-XII intact.    Diabetes Management Exam:    Foot Exam (with socks and/or shoes not present):       Sensory-Pinprick/Light touch:          Left medial foot (L-4): normal          Left dorsal  foot (L-5): normal          Left lateral foot (S-1): normal          Right medial foot (L-4): normal          Right dorsal foot (L-5): normal          Right lateral foot (S-1): normal       Sensory-Monofilament:          Left foot: normal          Right foot:  normal       Inspection:          Left foot: normal          Right foot: normal       Nails:          Left foot: normal          Right foot: normal   Impression & Recommendations:  Problem # 1:  DIABETES MELLITUS, TYPE II (ICD-250.00) pt prefers lifestyle management.  Cont weight loss and repeat A1C today. Her updated medication list for this problem includes:    Avalide 300-25 Mg Tabs (Irbesartan-hydrochlorothiazide) .Marland Kitchen... Take 1/2 tab once daily  Orders: Venipuncture (04540) Specimen Handling (98119) TLB-A1C / Hgb A1C (Glycohemoglobin) (83036-A1C)  Problem # 2:  VAGINAL DISCHARGE (ICD-623.5) Assessment: New We planned wet prep but unable to do here in office.  ?bacterial vaginosis.  Empiric trial of metronidazole and will send wet prep if no better in few days. Orders: Wet Prep ([id number])  Problem # 3:  HYPERTENSION (ICD-401.9)  The following medications were removed from the medication list:    Clonidine Hcl 0.1 Mg Tabs (Clonidine hcl) .Marland Kitchen... At bedtime Her updated medication list for this problem includes:    Toprol Xl 100 Mg Xr24h-tab (Metoprolol succinate) ..... Once daily    Furosemide 40 Mg Tabs (Furosemide) .Marland Kitchen... Take 1 tablet by mouth once a day    Avalide 300-25 Mg Tabs (Irbesartan-hydrochlorothiazide) .Marland Kitchen... Take 1/2 tab once daily  Complete Medication List: 1)  Toprol Xl 100 Mg Xr24h-tab (Metoprolol succinate) .... Once daily 2)  Furosemide 40 Mg Tabs (Furosemide) .... Take 1 tablet by mouth once a day 3)  Klor-con 10 10 Meq Tbcr (Potassium chloride) .... Take 1 tablet by mouth once a day 4)  Aciphex 20 Mg Tbec (Rabeprazole sodium) .... Take 1 tablet by mouth once a day 5)  Effexor Xr 75 Mg Xr24h-cap (Venlafaxine hcl) .... Once daily 6)  One-a-day Extras Antioxidant Caps (Multiple vitamins-minerals) .... Take 1 capsule by mouth once a day 7)  Flonase 50 Mcg/act Susp (Fluticasone propionate) .... 2 sprays to each nostril daily 8)  Vesicare 5 Mg Tabs (Solifenacin  succinate) .... Take 1 tablet by mouth once a day 9)  Zyrtec Allergy 10 Mg Caps (Cetirizine hcl) .... As needed 10)  Valium 5 Mg Tabs (Diazepam) .... Take 1/2 tablet daily 11)  Avalide 300-25 Mg Tabs (Irbesartan-hydrochlorothiazide) .... Take 1/2 tab once daily 12)  Albuterol 90 Mcg/act Aers (Albuterol) .... Inhale 2 puffs daily 13)  Hydrocodone-acetaminophen 10-500 Mg Tabs (Hydrocodone-acetaminophen) .... As needed 14)  Estrace 2 Mg Tabs (Estradiol) .... 1/2 tab in an, 1/2 tab in pm 15)  Meclizine Hcl 25 Mg Tabs (Meclizine hcl) .... Take one tab every 6 hours as needed for vertigo 16)  Azithromycin 250 Mg Tabs (Azithromycin) .... Two by mouth today then one by mouth once daily for 4 more days. 17)  Valtrex 1 Gm Tabs (Valacyclovir hcl) .... Take 2 tabs by  mouth every 12 hours for 1 day, and then as needed 18)  Onetouch Test Strp (Glucose blood) .... Test blood sugar one to two times daily as directed 19)  Welchol 625 Mg Tabs (Colesevelam hcl) .... Three times a day 20)  Onetouch Lancets Misc (Lancets) .... Daily as directed 21)  Metronidazole 500 Mg Tabs (Metronidazole) .... One by mouth two times a day for 7 days  Other Orders: Prescription Created Electronically [id number])  Hypertension Assessment/Plan:      The patient's hypertensive risk group is category C: Target organ damage and/or diabetes.  Today's blood pressure is 128/90.    Patient Instructions: 1)  Please schedule a follow-up appointment in 3 months .  2)  It is important that you exercise reguarly at least 20 minutes 5 times a week. If you develop chest pain, have severe difficulty breathing, or feel very tired, stop exercising immediately and seek medical attention.  3)  You need to lose weight. Consider a lower calorie diet and regular exercise.  4)  Check your blood sugars regularly. If your readings are usually above: 140 fasting or below 70 you should contact our office.  Prescriptions: KLOR-CON 10 10 MEQ  TBCR (POTASSIUM  CHLORIDE) Take 1 tablet by mouth once a day  #90 x 3   Entered and Authorized by:   Evelena Peat MD   Signed by:   Evelena Peat MD on 08/18/2009   Method used:   Electronically to        [facility]* (retail)       [address]       [place], [place]  [address]       Ph: [phone]       Fax: [phone]   RxID:   [id number] METRONIDAZOLE 500 MG TABS (METRONIDAZOLE) one by mouth two times a day for 7 days  #14 x 0   Entered and Authorized by:   Evelena Peat MD   Signed by:   Evelena Peat MD on 08/18/2009   Method used:   Electronically to        [facility]* (retail)       [address]       [place], [place]  [address]       Ph: [phone]       Fax: [phone]   RxID:   [id number] METRONIDAZOLE 500 MG TABS (METRONIDAZOLE) one by mouth two times a day for 7 days  #14 x 0   Entered and Authorized by:   Evelena Peat MD   Signed by:   Evelena Peat MD on 08/18/2009   Method used:   Electronically to        [facility]* (retail)       [address]       [place], [place]  [address]       Ph: [phone]       Fax: [phone]   RxID:   [id number]

## 2010-02-23 NOTE — Letter (Signed)
Summary: [facility]  [facility] Provider: This provider was preselected by the workflow.  Signature: The signature status of this document was preset by the workflow  Processed by [employer] Local Indexer Client @ Tuesday, December 07, 2008 9:26:39 AM using version:2010.1.2.11(2.4)   Manually Indexed By: [id number]  idlBatchDetail: [id number]   _____________________________________________________________________  External Attachment:    Type:   Image     Comment:   External Document

## 2010-02-23 NOTE — Letter (Signed)
Summary: [facility] Instructions  [facility]  [address] [place], [place] [address]   Phone: [phone]  Fax: [phone]       Dawn Salas    1954/08/17    MRN: [id number]        Procedure Day /Date: 05/05/2009 - Thursday     Arrival Time: 07:30AM     Procedure Time: 08:00AM     Location of Procedure:                    X  [facility] (4th Floor)   PREPARATION FOR COLONOSCOPY WITH MOVIPREP   Starting 5 days prior to your procedure, 04/09//2011, do not eat nuts, seeds, popcorn, corn, beans, peas,  salads, or any raw vegetables.  Do not take any fiber supplements (e.g. Metamucil, Citrucel, and Benefiber).  THE DAY BEFORE YOUR PROCEDURE         DATE: 04/13  DAY: Wednesday  1.  Drink clear liquids the entire day-NO SOLID FOOD  2.  Do not drink anything colored red or purple.  Avoid juices with pulp.  No orange juice.  3.  Drink at least 64 oz. (8 glasses) of fluid/clear liquids during the day to prevent dehydration and help the prep work efficiently.  CLEAR LIQUIDS INCLUDE: Water Jello Ice Popsicles Tea (sugar ok, no milk/cream) Powdered fruit flavored drinks Coffee (sugar ok, no milk/cream) Gatorade Juice: apple, white grape, white cranberry  Lemonade Clear bullion, consomm, broth Carbonated beverages (any kind) Strained chicken noodle soup Hard Candy                             4.  In the morning, mix first dose of MoviPrep solution:    Empty 1 Pouch A and 1 Pouch B into the disposable container    Add lukewarm drinking water to the top line of the container. Mix to dissolve    Refrigerate (mixed solution should be used within 24 hrs)  5.  Begin drinking the prep at 5:00 p.m. The MoviPrep container is divided by 4 marks.   Every 15 minutes drink the solution down to the next mark (approximately 8 oz) until the full liter is complete.   6.  Follow completed prep with 16 oz of clear liquid of your choice (Nothing red or  purple).  Continue to drink clear liquids until bedtime.  7.  Before going to bed, mix second dose of MoviPrep solution:    Empty 1 Pouch A and 1 Pouch B into the disposable container    Add lukewarm drinking water to the top line of the container. Mix to dissolve    Refrigerate  THE DAY OF YOUR PROCEDURE      DATE: 04/14 DAY: Thursday  Beginning at 3:00 AM (5 hours before procedure):         1. Every 15 minutes, drink the solution down to the next mark (approx 8 oz) until the full liter is complete.  2. Follow completed prep with 16 oz. of clear liquid of your choice.    3. You may drink clear liquids until 6:00AM (2 HOURS BEFORE PROCEDURE).   MEDICATION INSTRUCTIONS  Unless otherwise instructed, you should take regular prescription medications with a small sip of water   as early as possible the morning of your procedure.           OTHER INSTRUCTIONS  You will need a responsible adult at least 68  years of age to accompany you and drive you home.   This person must remain in the waiting room during your procedure.  Wear loose fitting clothing that is easily removed.  Leave jewelry and other valuables at home.  However, you may wish to bring a book to read or  an iPod/MP3 player to listen to music as you wait for your procedure to start.  Remove all body piercing jewelry and leave at home.  Total time from sign-in until discharge is approximately 2-3 hours.  You should go home directly after your procedure and rest.  You can resume normal activities the  day after your procedure.  The day of your procedure you should not:   Drive   Make legal decisions   Operate machinery   Drink alcohol   Return to work  You will receive specific instructions about eating, activities and medications before you leave.    The above instructions have been reviewed and explained to me by   Clide Cliff, RN______________________    I fully understand and can  verbalize these instructions _____________________________ Date _________

## 2010-02-23 NOTE — Progress Notes (Signed)
Summary: REFILL Effexor, pt needs OV  Phone Note Refill Request Message from:  Fax from Pharmacy  Refills Requested: Medication #1:  EFFEXOR XR 75 MG XR24H-CAP once daily   Brand Name Necessary? No [facility] FAX---[phone]  Initial call taken by: Warnell Forester,  April 07, 2009 3:24 PM  Follow-up for Phone Call        [facility], pt needs follow-up OV, maybe even CPX if in agreement.  Will discuss refilling med when she calls back.  Pt is on multiple med and has not had labs done here yet. Follow-up by: Sid Falcon LPN,  April 07, 2009 4:41 PM  Additional Follow-up for Phone Call Additional follow up Details #1::        Pt did call back and is scheduling a CPX now. Additional Follow-up by: Sid Falcon LPN,  April 08, 2009 8:29 AM

## 2010-02-23 NOTE — Progress Notes (Signed)
Summary: Avalide to [facility] X 1 year  Phone Note Call from Patient   Caller: Patient Call For: Dawn Peat MD Summary of Call: Pt called requesting a refill of Diovan HCT 160-25, one tab daily.be sent to [facility].  She has a bottle of it and ran out.  She tried to get a refill and [facility] did not have record of it??????? I do not see that med on her active med list.  She has Avalide 300-25, 1/2 tab daily on active med list Initial call taken by: Sid Falcon LPN,  January 12, 2010 12:34 PM  Follow-up for Phone Call        I think we changed at some point sec  to insurance issue. OK to change back to Avalide if this is covered by her insurance.  They are comparable meds Follow-up by: Dawn Peat MD,  January 12, 2010 12:42 PM    Prescriptions: AVALIDE 300-25 MG  TABS (IRBESARTAN-HYDROCHLOROTHIAZIDE) Take 1/2 tab once daily  #90 x 3   Entered by:   Sid Falcon LPN   Authorized by:   Dawn Peat MD   Signed by:   Sid Falcon LPN on 04/54/0981   Method used:   Faxed to ...       [facility] (mail-order)             , [place]         Ph: [phone]       Fax: [phone]   RxID:   [id number] ACIPHEX 20 MG  TBEC (RABEPRAZOLE SODIUM) Take 1 tablet by mouth once a day  #90 x 3   Entered by:   Sid Falcon LPN   Authorized by:   Dawn Peat MD   Signed by:   Sid Falcon LPN on 02/72/5366   Method used:   Faxed to ...       [facility] (mail-order)             , [place]         Ph: [phone]       Fax: [phone]   RxID:   [id number]

## 2010-02-23 NOTE — Miscellaneous (Signed)
Summary: Effexor XR correction  Clinical Lists Changes  Medications: Changed medication from EFFEXOR 75 MG  TABS (VENLAFAXINE HCL) Take 1 tablet by mouth once a day to EFFEXOR XR 75 MG XR24H-CAP (VENLAFAXINE HCL) once daily

## 2010-02-23 NOTE — Assessment & Plan Note (Signed)
Summary: headache/dm   Vital Signs:  Patient profile:   70 year old female Weight:      245 pounds Temp:     98.0 degrees F oral BP sitting:   160 / 100  (left arm) Cuff size:   regular  Vitals Entered By: Sid Falcon LPN (Jun 09, 2008 2:58 PM)  Serial Vital Signs/Assessments:  Time      Position  BP       Pulse  Resp  Temp     By                     244/01                         Evelena Peat MD  CC: Left side throbbing, at time shooting pain/headache   History of Present Illness: Patient is seen with headache and really somewhat poorly localized facial pain left side. She noticed this 2 days ago when eating. No chest pain. She describes a pain that radiates around that year up the right parietal area somewhat. She has had some pain with chewing on the left side past couple days. No rash and no fever. Denies facial weakness. No hearing changes. No blurred vision. No history of TMJ.  Allergies: 1)  ! Penicillin 2)  ! Alka-Seltzer (Aspirin Effervescent)  Past History:  Past Medical History:    Allergic rhinitis    Asthma    Hyperlipidemia    Hypertension     (11/08/2006)  Review of Systems  The patient denies anorexia, fever, weight loss, vision loss, and decreased hearing.    Physical Exam  General:  Well-developed,well-nourished,in no acute distress; alert,appropriate and cooperative throughout examination Head:  Normocephalic and atraumatic without obvious abnormalities. No apparent alopecia or balding.no mastoid tenderness. No reproducible tenderness in the scalp region.she has tenderness to palpation of the left TMJ joint. She also has some palpable click when opening and closing the mandible the left side Ears:  no abnormalities of the ear canal. Mouth:  Oral mucosa and oropharynx without lesions or exudates.  Teeth in good repair. Neck:  No deformities, masses, or tenderness noted. Lungs:  Normal respiratory effort, chest expands symmetrically. Lungs are  clear to auscultation, no crackles or wheezes. Heart:  Normal rate and regular rhythm. S1 and S2 normal without gallop, murmur, click, rub or other extra sounds. Neurologic:  cranial nerves II through XII are normal. She has normal sensory function in the face   Impression & Recommendations:  Problem # 1:  TMJ SYNDROME (ICD-524.60) Assessment New short-term use of Advil or Aleve and avoid chewing hard foods. Leave dentures out at night which she has a tendency to clamp down on.  Complete Medication List: 1)  Toprol Xl 200 Mg Tb24 (Metoprolol succinate) .... Take 1 tablet by mouth once a day 2)  Furosemide 40 Mg Tabs (Furosemide) .... Take 1 tablet by mouth once a day 3)  Klor-con 10 10 Meq Tbcr (Potassium chloride) .... Take 1 tablet by mouth once a day 4)  Aciphex 20 Mg Tbec (Rabeprazole sodium) .... Take 1 tablet by mouth once a day 5)  Effexor 75 Mg Tabs (Venlafaxine hcl) .... Take 1 tablet by mouth once a day 6)  One-a-day Extras Antioxidant Caps (Multiple vitamins-minerals) .... Take 1 capsule by mouth once a day 7)  Rhinocort Aqua 32 Mcg/act Susp (Budesonide (nasal)) .... Take 2 sprays in each nostril once a day 8)  Vesicare  5 Mg Tabs (Solifenacin succinate) .... Take 1 tablet by mouth once a day 9)  Claritin 10 Mg Tabs (Loratadine) .... Take 1 tablet by mouth daily as needed 10)  Valium 5 Mg Tabs (Diazepam) .... Take 1/2 tablet daily 11)  Avalide 300-25 Mg Tabs (Irbesartan-hydrochlorothiazide) .... Take 1/2 tab once daily 12)  Albuterol 90 Mcg/act Aers (Albuterol) .... Inhale 2 puffs daily 13)  Hydrocodone-acetaminophen 10-500 Mg Tabs (Hydrocodone-acetaminophen) .... As needed 14)  Estrace 2 Mg Tabs (Estradiol) .... 1/2 tab in an, 1/2 tab in pm 15)  Prednisone 10 Mg Tabs (Prednisone) .... Taper as follows:  6-5-4-4-3-3-2-1  Patient Instructions: 1)  Leave dentures out at night. Used short-term Advil or Aleve for symptomatic relief. Avoid chewing hard foods. Be in touch in the next  few weeks his symptoms not improving. Prescriptions: RHINOCORT AQUA 32 MCG/ACT  SUSP (BUDESONIDE (NASAL)) Take 2 sprays in each nostril once a day  #1 x 11   Entered and Authorized by:   Evelena Peat MD   Signed by:   Evelena Peat MD on 06/09/2008   Method used:   Electronically to        [facility]* (retail)       [address]       [place], [place]  [address]       Ph: [phone]       Fax: [phone]   RxID:   [id number]

## 2010-02-23 NOTE — Letter (Signed)
Summary: Previsit letter  [facility]  [address] [place], [place] [address]   Phone: [phone]  Fax: [phone]       04/12/2009 MRN: [id number]  Dawn Salas [address] [place], [place]  [address]  Dear Ms. [facility],  Welcome to the Gastroenterology Division at [facility].    You are scheduled to see a nurse for your pre-procedure visit on April 25, 2009 at 3:30p.m. on the 3rd floor at [facility], [address]  We ask that you try to arrive at our office 15 minutes prior to your appointment time to allow for check-in.  Your nurse visit will consist of discussing your medical and surgical history, your immediate family medical history, and your medications.    Please bring a complete list of all your medications or, if you prefer, bring the medication bottles and we will list them.  We will need to be aware of both prescribed and over the counter drugs.  We will need to know exact dosage information as well.  If you are on blood thinners (Coumadin, Plavix, Aggrenox, Ticlid, etc.) please call our office today/prior to your appointment, as we need to consult with your physician about holding your medication.   Please be prepared to read and sign documents such as consent forms, a financial agreement, and acknowledgement forms.  If necessary, and with your consent, a friend or relative is welcome to sit-in on the nurse visit with you.  Please bring your insurance card so that we may make a copy of it.  If your insurance requires a referral to see a specialist, please bring your referral form from your primary care physician.  No co-pay is required for this nurse visit.     If you cannot keep your appointment, please call [phone] to cancel or reschedule prior to your appointment date.  This allows [place] the opportunity to schedule an appointment for another patient in need of care.    Thank you for choosing [facility] for your medical  needs.  We appreciate the opportunity to care for you.  Please visit [place] at our website  to learn more about our practice.                     Sincerely.                                                                                                                   The Gastroenterology Division

## 2010-02-23 NOTE — Progress Notes (Signed)
Summary: please advise  Phone Note Call from Patient Call back at Home Phone [phone]   Caller: Patient------voicemail Reason for Call: Lab or Test Results Summary of Call: Was seen yesterday. Need  her lab results. She is having colonoscopy tomorrow, and needs to know what to do? Initial call taken by: Warnell Forester,  May 03, 2009 3:47 PM  Follow-up for Phone Call        Pt called again to get status and to find out what she should do. Please call asap.  Follow-up by: Lucy Antigua,  May 03, 2009 4:52 PM  Additional Follow-up for Phone Call Additional follow up Details #1::        potassium is normal. pt notified.   OK to proceed with colonoscopy. Additional Follow-up by: Evelena Peat MD,  May 03, 2009 7:22 PM

## 2010-02-23 NOTE — Progress Notes (Signed)
Summary: Rx faxed for Meclizine refill  Phone Note From Pharmacy   Caller: [facility]* Call For: Burchette  Summary of Call: [facility] request for Meclizine HCL 25 mg. #30, take one every 6 hours as needed for vertigo.  I do not see this med on pt current med list Initial call taken by: Sid Falcon LPN,  November 12, 2008 2:17 PM  Follow-up for Phone Call        OK to refill once. Follow-up by: Evelena Peat MD,  November 12, 2008 2:24 PM  Additional Follow-up for Phone Call Additional follow up Details #1::        Rx sent electronically Additional Follow-up by: Sid Falcon LPN,  November 12, 2008 2:34 PM    New/Updated Medications: MECLIZINE HCL 25 MG TABS (MECLIZINE HCL) take one tab every 6 hours as needed for vertigo Prescriptions: MECLIZINE HCL 25 MG TABS (MECLIZINE HCL) take one tab every 6 hours as needed for vertigo  #30 x 0   Entered by:   Sid Falcon LPN   Authorized by:   Evelena Peat MD   Signed by:   Sid Falcon LPN on 16/10/9602   Method used:   Electronically to        [facility]* (retail)       [address]       [place], [place]  [address]       Ph: [phone]       Fax: [phone]   RxID:   [id number]

## 2010-02-23 NOTE — Assessment & Plan Note (Signed)
Summary: 2 WK ROV // RS   Vital Signs:  Patient profile:   70 year old female Menstrual status:  postmenopausal Weight:      233 pounds Temp:     98.1 degrees F oral BP sitting:   120 / 90  (left arm) Cuff size:   large  Vitals Entered By: Sid Falcon LPN (November 16, 2009 8:26 AM)  History of Present Illness: Patient is seen for followup. Recent hypokalemia with potassium 3.0. Patient increased potassium to 2 daily. Has had occasional muscle cramps.  Type 2 diabetes. Recent A1c 7.6%. Patient had nausea but no vomiting with metformin 500 mg b.i.d. She only took for couple of days. No diarrhea.  Allergies: 1)  ! Penicillin 2)  ! Alka-Seltzer (Aspirin Effervescent) 3)  Morphine Sulfate (Morphine Sulfate) 4)  Metformin Hcl (Metformin Hcl)  Past History:  Past Medical History: Last updated: 05/20/2009 Allergic rhinitis Asthma Hyperlipidemia Hypertension Depression GERD Peptic ulcer disease  palpitations obstructive sleep apnea meningiomas brain Hyperglycemia 4/11 PMH reviewed for relevance  Review of Systems  The patient denies anorexia, fever, chest pain, syncope, dyspnea on exertion, and peripheral edema.    Physical Exam  General:  Well-developed,well-nourished,in no acute distress; alert,appropriate and cooperative throughout examination Neck:  No deformities, masses, or tenderness noted. Lungs:  Normal respiratory effort, chest expands symmetrically. Lungs are clear to auscultation, no crackles or wheezes. Heart:  Normal rate and regular rhythm. S1 and S2 normal without gallop, murmur, click, rub or other extra sounds. Extremities:  No clubbing, cyanosis, edema, or deformity noted with normal full range of motion of all joints.     Impression & Recommendations:  Problem # 1:  HYPOKALEMIA (ICD-276.8)  recheck BMP today  Orders: Specimen Handling ([address]109) Venipuncture (60454) TLB-BMP (Basic Metabolic Panel-BMET) (80048-METABOL)  Problem # 2:  DIABETES  MELLITUS, TYPE II (ICD-250.00) patient will try one more trial of metformin 500 mg daily and slowly titrate to b.i.d. if tolerated.   Work on weight loss and more consistent exercise Her updated medication list for this problem includes:    Avalide 300-25 Mg Tabs (Irbesartan-hydrochlorothiazide) .Marland Kitchen... Take 1/2 tab once daily    Metformin Hcl 500 Mg Tabs (Metformin hcl) ..... One tab two times a day  Complete Medication List: 1)  Toprol Xl 100 Mg Xr24h-tab (Metoprolol succinate) .... Once daily 2)  Furosemide 40 Mg Tabs (Furosemide) .... Take 1 tablet by mouth once a day 3)  Klor-con 10 10 Meq Tbcr (Potassium chloride) .... Take 1 tablet by mouth once a day 4)  Aciphex 20 Mg Tbec (Rabeprazole sodium) .... Take 1 tablet by mouth once a day 5)  Effexor Xr 75 Mg Xr24h-cap (Venlafaxine hcl) .... Once daily 6)  One-a-day Extras Antioxidant Caps (Multiple vitamins-minerals) .... Take 1 capsule by mouth once a day 7)  Flonase 50 Mcg/act Susp (Fluticasone propionate) .... 2 sprays to each nostril daily 8)  Vesicare 5 Mg Tabs (Solifenacin succinate) .... Take 1 tablet by mouth once a day 9)  Zyrtec Allergy 10 Mg Caps (Cetirizine hcl) .... As needed 10)  Valium 5 Mg Tabs (Diazepam) .... Take 1/2 tablet daily 11)  Avalide 300-25 Mg Tabs (Irbesartan-hydrochlorothiazide) .... Take 1/2 tab once daily 12)  Albuterol 90 Mcg/act Aers (Albuterol) .... Inhale 2 puffs daily 13)  Hydrocodone-acetaminophen 10-500 Mg Tabs (Hydrocodone-acetaminophen) .... As needed 14)  Meclizine Hcl 25 Mg Tabs (Meclizine hcl) .... Take one tab every 6 hours as needed for vertigo 15)  Valtrex 1 Gm Tabs (Valacyclovir hcl) .Marland KitchenMarland KitchenMarland Kitchen  Take 2 tabs by mouth every 12 hours for 1 day, and then as needed 16)  Onetouch Test Strp (Glucose blood) .... Test blood sugar one to two times daily as directed 17)  Welchol 625 Mg Tabs (Colesevelam hcl) .... Three times a day 18)  Onetouch Lancets Misc (Lancets) .... Daily as directed 19)  Metformin Hcl 500  Mg Tabs (Metformin hcl) .... One tab two times a day  Patient Instructions: 1)  Eat more potassium rich foods such as bananas, oranje juice, and salt substitutes .  2)  It is important that you exercise reguarly at least 20 minutes 5 times a week. If you develop chest pain, have severe difficulty breathing, or feel very tired, stop exercising immediately and seek medical attention.  3)  You need to lose weight. Consider a lower calorie diet and regular exercise.  4)  try metformin 500 mg once daily and if tolerated after one week titrate to one twice daily   Orders Added: 1)  Specimen Handling [99000] 2)  Venipuncture [52778] 3)  TLB-BMP (Basic Metabolic Panel-BMET) [80048-METABOL] 4)  Est. Patient Level III [24235]

## 2010-02-23 NOTE — Medication Information (Signed)
Summary: Prior Authorization Request and Approval for Aciphex/[facility]  Prior Authorization Request and Approval for Aciphex/[facility]   Imported By: Maryln Gottron 11/01/2008 15:44:10  _____________________________________________________________________  External Attachment:    Type:   Image     Comment:   External Document

## 2010-02-23 NOTE — Progress Notes (Signed)
Summary: [facility] Aciphex question  Phone Note From Pharmacy   Caller: [facility]  [phone] Call For: Burchette  Summary of Call: [facility] left a VM message re: Aciphex 20 mg, one daily, not a preferred med.  Preferred medications would be Omeprazole or Nexium Ref # [id number] Initial call taken by: Sid Falcon LPN,  August 19, 2008 1:09 PM  Follow-up for Phone Call        If patient is agreeable, let's try change to Nexium 40 mg by mouth once daily. Follow-up by: Evelena Peat MD,  August 19, 2008 12:43 PM  Additional Follow-up for Phone Call Additional follow up Details #1::        I called pt, she reports she has tried Nexium and she still experienced the burning sensation and still had reflux symptoms.  Pt does not remember if she has tried Omeprazole, however the Aciphex has worked so well for her she would prefer to remain on it. Additional Follow-up by: Sid Falcon LPN,  August 19, 2008 1:23 PM    Additional Follow-up for Phone Call Additional follow up Details #2::    I'm OK with her staying on the Aciphex but I'm not sure what keeping her on that would entail. Follow-up by: Evelena Peat MD,  August 19, 2008 1:42 PM  Additional Follow-up for Phone Call Additional follow up Details #3:: Details for Additional Follow-up Action Taken: VM left on [facility] answering maching pt did not tolerate Nexium and Omeprazole, please fill Rx request for Aciphex Additional Follow-up by: Sid Falcon LPN,  August 19, 2008 5:15 PM  New/Updated Medications: NEXIUM 40 MG CPDR (ESOMEPRAZOLE MAGNESIUM) once daily

## 2010-02-23 NOTE — Progress Notes (Signed)
Summary: Avalide not covered  Phone Note Call from Patient   Summary of Call: Patient's insurance will not cover Avalide. Preferred drugs are Losartan, Hyzaar, Micartis HCT, Diovan HCT.  Please call [phone] ext [phone] reference [id number]. Initial call taken by: Darra Lis RMA,  October 20, 2008 10:54 AM  Follow-up for Phone Call        Change to Diovan hctz 160/25 one by mouth once daily.  Needs office f/u within 3 months of change of med. Follow-up by: Evelena Peat MD,  October 20, 2008 10:59 AM  Additional Follow-up for Phone Call Additional follow up Details #1::        Patient aware and [facility] aware. Additional Follow-up by: Darra Lis RMA,  October 20, 2008 11:06 AM

## 2010-02-23 NOTE — Miscellaneous (Signed)
Summary: Avalide chg to Losartan, Rx to [facility]  Clinical Lists Changes  Medications: Changed medication from AVALIDE 300-25 MG  TABS (IRBESARTAN-HYDROCHLOROTHIAZIDE) Take 1/2 tab once daily to LOSARTAN POTASSIUM-HCTZ 100-12.5 MG TABS (LOSARTAN POTASSIUM-HCTZ) once daily - Signed Rx of LOSARTAN POTASSIUM-HCTZ 100-12.5 MG TABS (LOSARTAN POTASSIUM-HCTZ) once daily;  #90 x 4;  Signed;  Entered by: Sid Falcon LPN;  Authorized by: Evelena Peat MD;  Method used: Electronically to [facility] [address], [place], [place], [place]  [address], Ph: [phone], Fax: [phone]    Prescriptions: LOSARTAN POTASSIUM-HCTZ 100-12.5 MG TABS (LOSARTAN POTASSIUM-HCTZ) once daily  #90 x 4   Entered by:   Sid Falcon LPN   Authorized by:   Evelena Peat MD   Signed by:   Sid Falcon LPN on 01/30/3233   Method used:   Electronically to        [facility]* (retail)       [address]       [place], [place]  [address]       Ph: [phone]       Fax: [phone]   RxID:   [id number]   Appended Document: Avalide chg to Losartan, Rx to [facility]    Prescriptions: LOSARTAN POTASSIUM-HCTZ 100-12.5 MG TABS (LOSARTAN POTASSIUM-HCTZ) once daily  #90 x 4   Entered by:   Sid Falcon LPN   Authorized by:   Evelena Peat MD   Signed by:   Sid Falcon LPN on 62/70/3500   Method used:   Faxed to ...       [facility] (mail-order)             , [place]         Ph: [phone]       Fax: [phone]   RxID:   [id number]

## 2010-02-23 NOTE — Assessment & Plan Note (Signed)
Summary: F/U ON DIABETES // RS   Vital Signs:  Patient profile:   70 year old female Menstrual status:  postmenopausal Height:      66.75 inches Weight:      243 pounds BMI:     38.48 Temp:     98.5 degrees F oral BP sitting:   130 / 92  (left arm) Cuff size:   large  Vitals Entered By: Sid Falcon LPN (May 20, 2009 8:20 AM)  Nutrition Counseling: Patient's BMI is greater than 25 and therefore counseled on weight management options. CC: Follow-up visit on blood sugars CBG Result 120   History of Present Illness: Patient seen for followup of elevated blood sugar.  Recent fasting blood sugar 127. A1c 7.9% when checked at cardiologist office. No prior history diabetes. Denies symptoms of hyperglycemia. Patient is exercising regularly and plans to make some dietary changes. No prior history of hyperglycemia.  Since last visit her cardiologist has added WelChol for treatment of cholesterol. Tolerating well with the exception of mild constipation. Recent TSH normal.  Allergies: 1)  ! Penicillin 2)  ! Alka-Seltzer (Aspirin Effervescent) 3)  Morphine Sulfate (Morphine Sulfate)  Past History:  Social History: Last updated: 04/12/2008 Retired Married Never Smoked Alcohol use-no  Past Medical History: Allergic rhinitis Asthma Hyperlipidemia Hypertension Depression GERD Peptic ulcer disease  palpitations obstructive sleep apnea meningiomas brain Hyperglycemia 4/11 PMH reviewed for relevance, SH/Risk Factors reviewed for relevance  Review of Systems  The patient denies anorexia, fever, weight loss, chest pain, syncope, and headaches.    Physical Exam  General:  Well-developed,well-nourished,in no acute distress; alert,appropriate and cooperative throughout examination Mouth:  Oral mucosa and oropharynx without lesions or exudates.  Teeth in good repair. Neck:  No deformities, masses, or tenderness noted. Lungs:  Normal respiratory effort, chest expands  symmetrically. Lungs are clear to auscultation, no crackles or wheezes. Heart:  normal rate, regular rhythm, and no gallop.     Impression & Recommendations:  Problem # 1:  DIABETES MELLITUS, TYPE II (ICD-250.00) Assessment New educational materials given. Local weight loss and exercise. At this point she prefers lifestyle modification versus medication. We'll plan followup in 3 months and repeat A1c then. Consider referral for diabetic teaching.  Over 25 minutes spent counseling and discussing diabetes. Her updated medication list for this problem includes:    Avalide 300-25 Mg Tabs (Irbesartan-hydrochlorothiazide) .Marland Kitchen... Take 1/2 tab once daily  Complete Medication List: 1)  Toprol Xl 100 Mg Xr24h-tab (Metoprolol succinate) .... Once daily 2)  Furosemide 40 Mg Tabs (Furosemide) .... Take 1 tablet by mouth once a day 3)  Klor-con 10 10 Meq Tbcr (Potassium chloride) .... Take 1 tablet by mouth once a day 4)  Aciphex 20 Mg Tbec (Rabeprazole sodium) .... Take 1 tablet by mouth once a day 5)  Effexor Xr 75 Mg Xr24h-cap (Venlafaxine hcl) .... Once daily 6)  One-a-day Extras Antioxidant Caps (Multiple vitamins-minerals) .... Take 1 capsule by mouth once a day 7)  Flonase 50 Mcg/act Susp (Fluticasone propionate) .... 2 sprays to each nostril daily 8)  Vesicare 5 Mg Tabs (Solifenacin succinate) .... Take 1 tablet by mouth once a day 9)  Claritin 10 Mg Tabs (Loratadine) .... Take 1 tablet by mouth daily as needed 10)  Valium 5 Mg Tabs (Diazepam) .... Take 1/2 tablet daily 11)  Avalide 300-25 Mg Tabs (Irbesartan-hydrochlorothiazide) .... Take 1/2 tab once daily 12)  Albuterol 90 Mcg/act Aers (Albuterol) .... Inhale 2 puffs daily 13)  Hydrocodone-acetaminophen 10-500 Mg Tabs (Hydrocodone-acetaminophen) .Marland KitchenMarland KitchenMarland Kitchen  As needed 14)  Estrace 2 Mg Tabs (Estradiol) .... 1/2 tab in an, 1/2 tab in pm 15)  Naftin 1 % Gel (Naftifine hcl) .... Apply to affected rash two times a day 16)  Meclizine Hcl 25 Mg Tabs  (Meclizine hcl) .... Take one tab every 6 hours as needed for vertigo 17)  Azithromycin 250 Mg Tabs (Azithromycin) .... Two by mouth today then one by mouth once daily for 4 more days. 18)  Valtrex 1 Gm Tabs (Valacyclovir hcl) .... Take 2 tabs by mouth every 12 hours for 1 day, and then as needed 19)  Clonidine Hcl 0.1 Mg Tabs (Clonidine hcl) .... At bedtime 20)  Onetouch Test Strp (Glucose blood) .... Test blood sugar one to two times daily as directed 21)  Welchol 625 Mg Tabs (Colesevelam hcl) .... Three times a day  Patient Instructions: 1)  Please schedule a follow-up appointment in 3 months .  2)  It is important that you exercise reguarly at least 20 minutes 5 times a week. If you develop chest pain, have severe difficulty breathing, or feel very tired, stop exercising immediately and seek medical attention.  3)  You need to lose weight. Consider a lower calorie diet and regular exercise.  4)  Check your blood sugars regularly. If your readings are usually above: 130 (fasting) or below 70 you should contact our office. 5)  Our goal for your glucose 2 hours after meals is < 180.  Prescriptions: ONETOUCH TEST  STRP (GLUCOSE BLOOD) test blood sugar one to two times daily as directed  #100 x 3   Entered by:   Sid Falcon LPN   Authorized by:   Evelena Peat MD   Signed by:   Sid Falcon LPN on 13/08/6576   Method used:   Electronically to        [facility]* (retail)       [address]       [place], [place]  [address]       Ph: [phone]       Fax: [phone]   RxID:   [id number]

## 2010-02-23 NOTE — Progress Notes (Signed)
Summary: [facility] med question  Phone Note From Pharmacy   Caller: [facility] Call For: Biurchette  Summary of Call: [facility] calling requesting clarification/question regarrding pt Furosemide and Metoprolol.  Call back at [phone] Ref # [id number] Initial call taken by: Sid Falcon LPN,  September 09, 2008 5:26 PM  Follow-up for Phone Call        Our records show Toprol 200 mg and [facility] records are 100mg .   Follow-up by: Sid Falcon LPN,  September 10, 2008 9:20 AM  Additional Follow-up for Phone Call Additional follow up Details #1::        Pt called back and left a VM indicating Dr Caryl Never had reduced the Toprol dose to 100.      New/Updated Medications: TOPROL XL 100 MG XR24H-TAB (METOPROLOL SUCCINATE) once daily Prescriptions: FUROSEMIDE 40 MG  TABS (FUROSEMIDE) Take 1 tablet by mouth once a day  #90 x 3   Entered by:   Sid Falcon LPN   Authorized by:   Evelena Peat MD   Signed by:   Sid Falcon LPN on 64/40/3474   Method used:   Electronically to        [facility]* (mail-order)             ,          Ph: [phone]       Fax: [phone]   RxID:   [id number] TOPROL XL 100 MG XR24H-TAB (METOPROLOL SUCCINATE) once daily  #90 x 3   Entered by:   Sid Falcon LPN   Authorized by:   Evelena Peat MD   Signed by:   Sid Falcon LPN on 55/73/2202   Method used:   Electronically to        [facility]* (mail-order)             ,          Ph: [phone]       Fax: [phone]   RxID:   [id number]

## 2010-02-23 NOTE — Assessment & Plan Note (Signed)
Summary: 3 MTH ROV // RS   Vital Signs:  Patient profile:   70 year old female Menstrual status:  postmenopausal Weight:      239 pounds Temp:     98.0 degrees F oral BP sitting:   160 / 90  (left arm) Cuff size:   large  Vitals Entered By: Sid Falcon LPN (November 02, 2009 3:30 PM) CC: 3 month follow-up, Hypertension Management Is Patient Diabetic? Yes Did you bring your meter with you today? Yes   History of Present Illness: Patient seen with nonspecific symptoms of fatigue and some lightheadedness. No true orthostatic symptoms. No syncope. Denies any chest pains or dyspnea. She does recall increased stressors recently and decreased sleep. No depressive symptoms. Chronic hot flashes which are causing sleep difficulties. Her gynecologist recently placed her on clonidine.  Also takes Effexor.  She has type 2 diabetes which is currently untreated. Fasting blood sugars around 130-155 range. Postprandials around 190-218. Some chronic urinary frequency. No fevers. No weight loss.  she has hypertension and medications are reviewed. She did not take her medications today. Blood pressures have been well maintained by home readings.   Hypertension History:      She denies headache, chest pain, palpitations, dyspnea with exertion, orthopnea, peripheral edema, visual symptoms, neurologic problems, syncope, and side effects from treatment.        Positive major cardiovascular risk factors include female age 58 years old or older, diabetes, hyperlipidemia, and hypertension.  Negative major cardiovascular risk factors include non-tobacco-user status.     Allergies: 1)  ! Penicillin 2)  ! Alka-Seltzer (Aspirin Effervescent) 3)  Morphine Sulfate (Morphine Sulfate)  Past History:  Past Medical History: Last updated: 05/20/2009 Allergic rhinitis Asthma Hyperlipidemia Hypertension Depression GERD Peptic ulcer disease  palpitations obstructive sleep apnea meningiomas  brain Hyperglycemia 4/11  Past Surgical History: Last updated: 04/11/2009 Hysterectomy Tonsillectomy Tubal-ligation R knee arthroscopy X 2 right foot bunectomy right shoulder rotator cuff L4-L5 posterior la C5-C6 neck fusion lt foot plantar & hammertoe 8/10 meningioma excision  Family History: Last updated: 04/11/2009 Family History of Arthritis grandparent Family History High cholesterol Family History Hypertension Family History Ovarian cancer mother Family History of Cardiovascular disorder Father died 18 ?etiology Diabetes parent and grandparent  Social History: Last updated: 04/12/2008 Retired Married Never Smoked Alcohol use-no  Risk Factors: Exercise: yes (08/18/2009)  Risk Factors: Smoking Status: never (04/11/2009) PMH-FH-SH reviewed for relevance  Review of Systems  The patient denies anorexia, fever, weight loss, vision loss, chest pain, syncope, dyspnea on exertion, peripheral edema, prolonged cough, headaches, hemoptysis, abdominal pain, melena, hematochezia, severe indigestion/heartburn, hematuria, incontinence, and muscle weakness.    Physical Exam  General:  Well-developed,well-nourished,in no acute distress; alert,appropriate and cooperative throughout examination Eyes:  pupils equal, pupils round, and pupils reactive to light.   Mouth:  Oral mucosa and oropharynx without lesions or exudates.  Teeth in good repair. Neck:  No deformities, masses, or tenderness noted. Lungs:  Normal respiratory effort, chest expands symmetrically. Lungs are clear to auscultation, no crackles or wheezes. Heart:  normal rate and regular rhythm.   Extremities:  No clubbing, cyanosis, edema, or deformity noted with normal full range of motion of all joints.   Neurologic:  alert & oriented X3, cranial nerves II-XII intact, and gait normal.   Cervical Nodes:  No lymphadenopathy noted Psych:  normally interactive, good eye contact, not anxious appearing, and not depressed  appearing.     Impression & Recommendations:  Problem # 1:  DIABETES MELLITUS, TYPE  II (ICD-250.00)  Her updated medication list for this problem includes:    Avalide 300-25 Mg Tabs (Irbesartan-hydrochlorothiazide) .Marland Kitchen... Take 1/2 tab once daily  Orders: TLB-A1C / Hgb A1C (Glycohemoglobin) (83036-A1C) Specimen Handling (11914) Venipuncture (78295)  Problem # 2:  FATIGUE (ICD-780.79) check labs.  ?related to situational stressors and poor sleep.  Also explained clonidine can cause fatigue and she may wish to talk to gyn  about other therapies. Orders: TLB-CBC Platelet - w/Differential (85025-CBCD) TLB-TSH (Thyroid Stimulating Hormone) (84443-TSH) Specimen Handling (62130) Venipuncture (86578)  Problem # 3:  DIZZINESS (ICD-780.4)  ?sec to clonidine. Her updated medication list for this problem includes:    Zyrtec Allergy 10 Mg Caps (Cetirizine hcl) .Marland Kitchen... As needed    Meclizine Hcl 25 Mg Tabs (Meclizine hcl) .Marland Kitchen... Take one tab every 6 hours as needed for vertigo  Orders: TLB-BMP (Basic Metabolic Panel-BMET) (80048-METABOL) TLB-CBC Platelet - w/Differential (85025-CBCD) Specimen Handling (46962) Venipuncture (95284)  Problem # 4:  HYPERTENSION (ICD-401.9) up slightly today but didn't take meds. Her updated medication list for this problem includes:    Toprol Xl 100 Mg Xr24h-tab (Metoprolol succinate) ..... Once daily    Furosemide 40 Mg Tabs (Furosemide) .Marland Kitchen... Take 1 tablet by mouth once a day    Avalide 300-25 Mg Tabs (Irbesartan-hydrochlorothiazide) .Marland Kitchen... Take 1/2 tab once daily  Complete Medication List: 1)  Toprol Xl 100 Mg Xr24h-tab (Metoprolol succinate) .... Once daily 2)  Furosemide 40 Mg Tabs (Furosemide) .... Take 1 tablet by mouth once a day 3)  Klor-con 10 10 Meq Tbcr (Potassium chloride) .... Take 1 tablet by mouth once a day 4)  Aciphex 20 Mg Tbec (Rabeprazole sodium) .... Take 1 tablet by mouth once a day 5)  Effexor Xr 75 Mg Xr24h-cap (Venlafaxine hcl) ....  Once daily 6)  One-a-day Extras Antioxidant Caps (Multiple vitamins-minerals) .... Take 1 capsule by mouth once a day 7)  Flonase 50 Mcg/act Susp (Fluticasone propionate) .... 2 sprays to each nostril daily 8)  Vesicare 5 Mg Tabs (Solifenacin succinate) .... Take 1 tablet by mouth once a day 9)  Zyrtec Allergy 10 Mg Caps (Cetirizine hcl) .... As needed 10)  Valium 5 Mg Tabs (Diazepam) .... Take 1/2 tablet daily 11)  Avalide 300-25 Mg Tabs (Irbesartan-hydrochlorothiazide) .... Take 1/2 tab once daily 12)  Albuterol 90 Mcg/act Aers (Albuterol) .... Inhale 2 puffs daily 13)  Hydrocodone-acetaminophen 10-500 Mg Tabs (Hydrocodone-acetaminophen) .... As needed 14)  Meclizine Hcl 25 Mg Tabs (Meclizine hcl) .... Take one tab every 6 hours as needed for vertigo 15)  Valtrex 1 Gm Tabs (Valacyclovir hcl) .... Take 2 tabs by mouth every 12 hours for 1 day, and then as needed 16)  Onetouch Test Strp (Glucose blood) .... Test blood sugar one to two times daily as directed 17)  Welchol 625 Mg Tabs (Colesevelam hcl) .... Three times a day 18)  Onetouch Lancets Misc (Lancets) .... Daily as directed  Other Orders: Flu Vaccine 59yrs + MEDICARE PATIENTS (X3244) Administration Flu vaccine - MCR (W1027)  Hypertension Assessment/Plan:      The patient's hypertensive risk group is category C: Target organ damage and/or diabetes.  Today's blood pressure is 160/90.    Patient Instructions: 1)  Call us regarding your dose of clonidine.   Flu Vaccine Consent Questions     Do you have a history of severe allergic reactions to this vaccine? no    Any prior history of allergic reactions to egg and/or gelatin? no    Do you have a  sensitivity to the preservative Thimersol? no    Do you have a past history of Guillan-Barre Syndrome? no    Do you currently have an acute febrile illness? no    Have you ever had a severe reaction to latex? no    Vaccine information given and explained to patient? yes    Are you  currently pregnant? no    Lot Number:[id number]   Exp Date:07/22/2010   Site Given  Left Deltoid IMdflu

## 2010-02-23 NOTE — Progress Notes (Signed)
Summary: Resend moviprep  Phone Note Call from Patient Call back at [facility] Phone [phone]   Caller: Patient Call For: Dr. Arlyce Dice Reason for Call: Talk to Nurse Summary of Call: Resend moviprep [phone] Initial call taken by: Karna Christmas,  April 27, 2009 8:48 AM  Follow-up for Phone Call        [facility] Moviprep [facility]. Message left on pt's answering machine. Follow-up by: Ezra Sites RN,  April 27, 2009 9:46 AM

## 2010-02-23 NOTE — Assessment & Plan Note (Signed)
Summary: PHYSICAL/CB   Vital Signs:  Dawn Salas profile:   70 year old female Menstrual status:  postmenopausal Height:      66.75 inches Weight:      246 pounds Temp:     97.0 degrees F oral Pulse rate:   72 / minute Pulse rhythm:   regular Resp:     12 per minute BP sitting:   130 / 90  (left arm) Cuff size:   large  Vitals Entered By: Sid Falcon LPN (April 11, 2009 10:32 AM) CC: CPX, pt fasting for labs     Menstrual Status postmenopausal Last PAP Result normal   History of Present Illness: Dawn Salas here for complete physical examination. She sees gynecologist regularly had Pap smear done in November which was normal. Previous hysterectomy but ovaries left. Mammogram October of 2010. Tetanus 2005. Pneumovax last year. No history of screening colonoscopy.  Dawn Salas has multiple chronic problems including history of asthma, allergic rhinitis, hyperlipidemia, hypertension, obstructive sleep apnea, GERD, and history of depression. Medications reviewed.  She has recently seen a cardiologist in [facility] who practices noninvasive cardiology and apparently some alternative medicine well. She is taking red yeast rice extract for hyperlipidemia. Has been intolerant of multiple statins in the past.  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 Followed closely by neurosurgery.  Family history, social history reviewed and as recorded.    Preventive Screening-Counseling & Management  Alcohol-Tobacco     Smoking Status: never  Allergies: 1)  ! Penicillin 2)  ! Alka-Seltzer (Aspirin Effervescent) 3)  Morphine Sulfate (Morphine Sulfate)  Past History:  Past Medical History: Last updated: 09/06/2008 Allergic rhinitis Asthma Hyperlipidemia Hypertension Depression GERD Peptic ulcer disease  palpitations obstructive sleep apnea meningiomas brain  Family History: Last updated: 04/11/2009 Family History of Arthritis  grandparent Family History High cholesterol Family History Hypertension Family History Ovarian cancer mother Family History of Cardiovascular disorder Father died 20 ?etiology Diabetes parent and grandparent  Social History: Last updated: 04/12/2008 Retired Married Never Smoked Alcohol use-no  Risk Factors: Smoking Status: never (04/11/2009)  Past Surgical History: Hysterectomy Tonsillectomy Tubal-ligation R knee arthroscopy X 2 right foot bunectomy right shoulder rotator cuff L4-L5 posterior la C5-C6 neck fusion lt foot plantar & hammertoe 8/10 meningioma excision  Family History: Family History of Arthritis grandparent Family History High cholesterol Family History Hypertension Family History Ovarian cancer mother Family History of Cardiovascular disorder Father died 81 ?etiology Diabetes parent and grandparent  Review of Systems  The Dawn Salas denies anorexia, fever, weight loss, weight gain, vision loss, hoarseness, chest pain, syncope, dyspnea on exertion, peripheral edema, prolonged cough, headaches, hemoptysis, abdominal pain, melena, hematochezia, severe indigestion/heartburn, hematuria, incontinence, genital sores, muscle weakness, suspicious skin lesions, depression, abnormal bleeding, enlarged lymph nodes, and breast masses.         frequent stiffness left palm CMC joint mostly  Physical Exam  General:  Well-developed,well-nourished,in no acute distress; alert,appropriate and cooperative throughout examination Head:  Normocephalic and atraumatic without obvious abnormalities. No apparent alopecia or balding. Eyes:  pupils equal, pupils round, and pupils reactive to light.   Ears:  External ear exam shows no significant lesions or deformities.  Otoscopic examination reveals clear canals, tympanic membranes are intact bilaterally without bulging, retraction, inflammation or discharge. Hearing is grossly normal bilaterally. Mouth:  Oral mucosa and oropharynx  without lesions or exudates.  Teeth in good repair. Neck:  No deformities, masses, or tenderness noted. Breasts:  her GYN Lungs:  Normal respiratory effort,  chest expands symmetrically. Lungs are clear to auscultation, no crackles or wheezes. Heart:  Normal rate and regular rhythm. S1 and S2 normal without gallop, murmur, click, rub or other extra sounds. Abdomen:  Bowel sounds positive,abdomen soft and non-tender without masses, organomegaly or hernias noted. Genitalia:  per GYN Extremities:  No clubbing, cyanosis, edema, or deformity noted with normal full range of motion of all joints.   Neurologic:  alert & oriented X3, cranial nerves II-XII intact, and strength normal in all extremities.   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 Alert and cooperative with normal attention span and concentration. No apparent delusions, illusions, hallucinations   Impression & Recommendations:  Problem # 1:  Preventive Health Care (ICD-V70.0) work on weight loss. Dawn Salas needs screening colonoscopy. Discussed shingles vaccine and she will check on insurance coverage. Screening lab work. Immunizations up to date otherwise  Complete Medication List: 1)  Toprol Xl 100 Mg Xr24h-tab (Metoprolol succinate) .... Once daily 2)  Furosemide 40 Mg Tabs (Furosemide) .... Take 1 tablet by mouth once a day 3)  Klor-con 10 10 Meq Tbcr (Potassium chloride) .... Take 1 tablet by mouth once a day 4)  Aciphex 20 Mg Tbec (Rabeprazole sodium) .... Take 1 tablet by mouth once a day 5)  Effexor Xr 75 Mg Xr24h-cap (Venlafaxine hcl) .... Once daily 6)  One-a-day Extras Antioxidant Caps (Multiple vitamins-minerals) .... Take 1 capsule by mouth once a day 7)  Flonase 50 Mcg/act Susp (Fluticasone propionate) .... 2 sprays  to each nostril daily 8)  Vesicare 5 Mg Tabs (Solifenacin succinate) .... Take 1 tablet by mouth once a day 9)  Claritin 10 Mg Tabs (Loratadine) .... Take 1 tablet by mouth daily as needed 10)  Valium 5 Mg Tabs (Diazepam) .... Take 1/2 tablet daily 11)  Avalide 300-25 Mg Tabs (Irbesartan-hydrochlorothiazide) .... Take 1/2 tab once daily 12)  Albuterol 90 Mcg/act Aers (Albuterol) .... Inhale 2 puffs daily 13)  Hydrocodone-acetaminophen 10-500 Mg Tabs (Hydrocodone-acetaminophen) .... As needed 14)  Estrace 2 Mg Tabs (Estradiol) .... 1/2 tab in an, 1/2 tab in pm 15)  Naftin 1 % Gel (Naftifine hcl) .... Apply to affected rash two times a day 16)  Meclizine Hcl 25 Mg Tabs (Meclizine hcl) .... Take one tab every 6 hours as needed for vertigo 17)  Azithromycin 250 Mg Tabs (Azithromycin) .... Two by mouth today then one by mouth once daily for 4 more days. 18)  Valtrex 1 Gm Tabs (Valacyclovir hcl) .... Take 2 tabs by mouth every 12 hours for 1 day, and then as needed  Other Orders: TLB-Lipid Panel (80061-LIPID) TLB-BMP (Basic Metabolic Panel-BMET) (80048-METABOL) TLB-CBC Platelet - w/Differential (85025-CBCD) TLB-Hepatic/Liver Function Pnl (80076-HEPATIC) TLB-TSH (Thyroid Stimulating Hormone) (54098-JXB) Gastroenterology Referral (GI)  Dawn Salas Instructions: 1)  Check on insurance coverage for shingles vaccine 2)  It is important that you exercise reguarly at least 20 minutes 5 times a week. If you develop chest pain, have severe difficulty breathing, or feel very tired, stop exercising immediately and seek medical attention.  3)  You need to lose weight. Consider a lower calorie diet and regular exercise.    Preventive Care Screening  Pap Smear:    Date:  11/22/2008    Results:  normal   Last Pneumovax:    Date:  08/23/2008    Results:  given   Bone Density:    Date:  01/22/2006    Results:  normal std dev

## 2010-02-23 NOTE — Letter (Signed)
Summary: Records from [facility] 2009  Records from [facility] 2009   Imported By: Maryln Gottron 07/01/2008 09:52:49  _____________________________________________________________________  External Attachment:    Type:   Image     Comment:   External Document

## 2010-02-23 NOTE — Progress Notes (Signed)
Summary: [facility] approved rhinocort aqua till 5=19=2012  Phone Note From Other Clinic   Caller: [employer] Call For: burchette Summary of Call: [employer] approved rhinocort aqua till 06-10-2010 Initial call taken by: Roselle Locus,  Jun 11, 2008 7:53 AM

## 2010-02-23 NOTE — Medication Information (Signed)
Summary: Order for Diabetic Supplies  Order for Diabetic Supplies   Imported By: Maryln Gottron 02/06/2010 10:52:41  _____________________________________________________________________  External Attachment:    Type:   Image     Comment:   External Document

## 2010-02-23 NOTE — Letter (Signed)
Summary: Application for Handicapped Placard  Application for Handicapped Placard   Imported By: Maryln Gottron 09/08/2008 08:45:21  _____________________________________________________________________  External Attachment:    Type:   Image     Comment:   External Document

## 2010-02-23 NOTE — Letter (Signed)
Summary: [facility]  [facility]   Imported By: Maryln Gottron 09/23/2008 10:33:03  _____________________________________________________________________  External Attachment:    Type:   Image     Comment:   External Document

## 2010-02-23 NOTE — Assessment & Plan Note (Signed)
Summary: 2 week fup//ccm---- PT [facility] // RS   Vital Signs:  Patient profile:   70 year old female Menstrual status:  postmenopausal Weight:      239 pounds Temp:     98.8 degrees F oral BP sitting:   122 / 88  (left arm) Cuff size:   large  Vitals Entered By: Sid Falcon LPN (May 02, 2009 4:38 PM) CC: 2 week follow-up   History of Present Illness: Patient is seen for followup recent abnormal labs.  Potassium 3.2. Patient takes furosemide 40 mg daily and we increased her potassium from 10 mEq to 20 mg once daily. Denies any recent vomiting or diarrhea.  Has had some generalized fatigue issues. Recent labs also notable for white blood count 3.2 thousand down from previous of 3.9 thousand several months ago. Hemoglobin 11.7 down from 12.5 and platelets 160,000 down from 230,000.  Medications reviewed. Patient has been started on clonidine and Estrace for postmenopausal hot flashes per GYN.  Allergies: 1)  ! Penicillin 2)  ! Alka-Seltzer (Aspirin Effervescent) 3)  Morphine Sulfate (Morphine Sulfate)  Past History:  Past Medical History: Last updated: 09/06/2008 Allergic rhinitis Asthma Hyperlipidemia Hypertension Depression GERD Peptic ulcer disease  palpitations obstructive sleep apnea meningiomas brain PMH reviewed for relevance  Review of Systems  The patient denies anorexia, fever, weight loss, chest pain, syncope, dyspnea on exertion, peripheral edema, prolonged cough, headaches, hemoptysis, abdominal pain, melena, hematochezia, and severe indigestion/heartburn.    Physical Exam  General:  Well-developed,well-nourished,in no acute distress; alert,appropriate and cooperative throughout examination Head:  Normocephalic and atraumatic without obvious abnormalities. No apparent alopecia or balding. Eyes:  pupils equal, pupils round, and pupils reactive to light.   Mouth:  Oral mucosa and oropharynx without lesions or exudates.  Teeth in good repair. Neck:  No  deformities, masses, or tenderness noted. Lungs:  Normal respiratory effort, chest expands symmetrically. Lungs are clear to auscultation, no crackles or wheezes. Heart:  Normal rate and regular rhythm. S1 and S2 normal without gallop, murmur, click, rub or other extra sounds.   Impression & Recommendations:  Problem # 1:  HYPOKALEMIA (ICD-276.8) recheck labs. Reviewed high potassium food sources Orders: TLB-BMP (Basic Metabolic Panel-BMET) (80048-METABOL)  Problem # 2:  LEUKOPENIA, MILD (ICD-288.50) minimal leukopenia of uncertain significance. Given comparison of recent labs November of last year with decline in platelets, white blood cell, and hemoglobin will further assess with repeat CBC. Patient had normal iron studies last November Orders: TLB-CBC Platelet - w/Differential (85025-CBCD)  Complete Medication List: 1)  Toprol Xl 100 Mg Xr24h-tab (Metoprolol succinate) .... Once daily 2)  Furosemide 40 Mg Tabs (Furosemide) .... Take 1 tablet by mouth once a day 3)  Klor-con 10 10 Meq Tbcr (Potassium chloride) .... Take 1 tablet by mouth once a day 4)  Aciphex 20 Mg Tbec (Rabeprazole sodium) .... Take 1 tablet by mouth once a day 5)  Effexor Xr 75 Mg Xr24h-cap (Venlafaxine hcl) .... Once daily 6)  One-a-day Extras Antioxidant Caps (Multiple vitamins-minerals) .... Take 1 capsule by mouth once a day 7)  Flonase 50 Mcg/act Susp (Fluticasone propionate) .... 2 sprays to each nostril daily 8)  Vesicare 5 Mg Tabs (Solifenacin succinate) .... Take 1 tablet by mouth once a day 9)  Claritin 10 Mg Tabs (Loratadine) .... Take 1 tablet by mouth daily as needed 10)  Valium 5 Mg Tabs (Diazepam) .... Take 1/2 tablet daily 11)  Avalide 300-25 Mg Tabs (Irbesartan-hydrochlorothiazide) .... Take 1/2 tab once daily 12)  Albuterol  90 Mcg/act Aers (Albuterol) .... Inhale 2 puffs daily 13)  Hydrocodone-acetaminophen 10-500 Mg Tabs (Hydrocodone-acetaminophen) .... As needed 14)  Estrace 2 Mg Tabs  (Estradiol) .... 1/2 tab in an, 1/2 tab in pm 15)  Naftin 1 % Gel (Naftifine hcl) .... Apply to affected rash two times a day 16)  Meclizine Hcl 25 Mg Tabs (Meclizine hcl) .... Take one tab every 6 hours as needed for vertigo 17)  Azithromycin 250 Mg Tabs (Azithromycin) .... Two by mouth today then one by mouth once daily for 4 more days. 18)  Valtrex 1 Gm Tabs (Valacyclovir hcl) .... Take 2 tabs by mouth every 12 hours for 1 day, and then as needed 19)  Clonidine Hcl 0.1 Mg Tabs (Clonidine hcl) .... At bedtime  Patient Instructions: 1)  Eat more potassium rich foods such as bananas, oranje juice, and salt substitutes .

## 2010-02-23 NOTE — Progress Notes (Signed)
Summary: Med change clarification, avalide chg to [facility]  Phone Note Call from Patient   Caller: Patient Call For: Evelena Peat MD Summary of Call: Pt is confused about her medication.  She thought she was to take Avalide, however she just received Losartan/HCTZ 100-12.5 from [facility]. Was this changed on a paper fax?  I do see it changed on her med list?  Initial call taken by: Sid Falcon LPN,  January 24, 2010 3:24 PM  Follow-up for Phone Call        We were given a notice that Avalide no longer availabe.  Losartan hctz is comparable and should work well in place of Avalide. Follow-up by: Evelena Peat MD,  January 24, 2010 5:55 PM  Additional Follow-up for Phone Call Additional follow up Details #1::        pt informed on personally identified VM Additional Follow-up by: Sid Falcon LPN,  January 25, 2010 12:20 PM

## 2010-02-23 NOTE — Progress Notes (Signed)
Summary: REQ FOR REFILL (Effexor)  Phone Note Call from Patient   Caller: Patient   [phone] Reason for Call: Refill Medication, Talk to Nurse, Talk to Doctor Summary of Call: Pt called to req a refill for med: Effexor Xr 75 Mg be sent to [facility] (90-day supply)..... Pt can be reached at [phone].  Initial call taken by: Debbra Riding,  April 18, 2009 2:30 PM  Follow-up for Phone Call        Rx filled for one year to [facility], pt informed Follow-up by: Sid Falcon LPN,  April 18, 2009 3:17 PM    Prescriptions: EFFEXOR XR 75 MG XR24H-CAP (VENLAFAXINE HCL) once daily  #90 x 3   Entered by:   Sid Falcon LPN   Authorized by:   Evelena Peat MD   Signed by:   Sid Falcon LPN on 30/86/5784   Method used:   Electronically to        [facility] (mail-order)             ,          Ph: [phone]       Fax: [phone]   RxID:   [id number]

## 2010-02-23 NOTE — Progress Notes (Signed)
Summary: One touch lancets X 1 year  Phone Note Call from Patient Call back at Home Phone [phone]   Caller: Patient Call For: Evelena Peat MD Summary of Call: VM from pt requesting One touch lancets to [facility] in [place] Initial call taken by: Sid Falcon LPN,  June 28, 979 4:25 PM    New/Updated Medications: ONETOUCH LANCETS  MISC (LANCETS) daily as directed Prescriptions: ONETOUCH LANCETS  MISC (LANCETS) daily as directed  #100 x 3   Entered by:   Sid Falcon LPN   Authorized by:   Evelena Peat MD   Signed by:   Sid Falcon LPN on 19/14/7829   Method used:   Electronically to        [facility]* (retail)       [address]       [place], [place]  [address]       Ph: [phone]       Fax: [phone]   RxID:   [id number]   Appended Document: One touch lancets X 1 year Patient calls saying the Rx for lancets not at pharmacy.  Pharmacy says to refax to equipment side [phone].  Done.

## 2010-02-23 NOTE — Miscellaneous (Signed)
Summary: previsit  Clinical Lists Changes  Medications: Added new medication of MOVIPREP 100 GM  SOLR (PEG-KCL-NACL-NASULF-NA ASC-C) As directed - Signed Rx of MOVIPREP 100 GM  SOLR (PEG-KCL-NACL-NASULF-NA ASC-C) As directed;  #1 x 0;  Signed;  Entered by: Clide Cliff RN;  Authorized by: Louis Meckel MD;  Method used: Electronically to [facility] [address], [place], [place], [place]  [address], Ph: [phone], Fax: [phone] Allergies: Changed allergy or adverse reaction from PENICILLIN to PENICILLIN Changed allergy or adverse reaction from ALKA-SELTZER (ASPIRIN EFFERVESCENT) to ALKA-SELTZER (ASPIRIN EFFERVESCENT)    Prescriptions: MOVIPREP 100 GM  SOLR (PEG-KCL-NACL-NASULF-NA ASC-C) As directed  #1 x 0   Entered by:   Clide Cliff RN   Authorized by:   Louis Meckel MD   Signed by:   Clide Cliff RN on 04/25/2009   Method used:   Electronically to        [facility]* (retail)       [address]       [place], [place]  [address]       Ph: [phone]       Fax: [phone]   RxID:   [id number]

## 2010-02-23 NOTE — Assessment & Plan Note (Signed)
Summary: rash/allergic reaction/LC   Vital Signs:  Patient profile:   70 year old female Temp:     98.1 degrees F oral BP sitting:   130 / 90  (left arm) Cuff size:   regular  Vitals Entered By: Sid Falcon LPN (September 06, 2008 12:22 PM) CC: Rash under breast, back, behind ear, scratching alot X 2 weeks   History of Present Illness: Patient seen with puriitic rashes involving various areas of her body. Recent hospitalization for removal of a couple of meningiomas and her surgery went uneventfully. She had an IV in her right arm and has one rash right antecubital region which is pruritic and slightly raised. Has tried topical cortisone for couple days. She also has somewhat scaly pruritic rash under both breasts and in groin region as well as couple of creases in her back region. No history of diabetes.  no fever.  Allergies: 1)  ! Penicillin 2)  ! Alka-Seltzer (Aspirin Effervescent)  Past History:  Past Medical History: Allergic rhinitis Asthma Hyperlipidemia Hypertension Depression GERD Peptic ulcer disease  palpitations obstructive sleep apnea meningiomas brain  Review of Systems      See HPI  Physical Exam  General:  Well-developed,well-nourished,in no acute distress; alert,appropriate and cooperative throughout examination Head:  patient has scar left parietal region from recent surgery which is healing well. No signs of scalp erythema. Lungs:  clear to auscultation Heart:  regular rhythm and rate Skin:  right antecubital fossa reveals a minimally raised slightly vesicular nontender rash around the site from recent IV. Under both breasts she has macular erythematous minimally scaly rash   Impression & Recommendations:  Problem # 1:  TINEA CRURIS (ICD-110.3) Naftin gel two times a day and keep area as dry as possible.  Problem # 2:  CONTACT DERMATITIS (ICD-692.9) continue cortisone cream. Claritan as needed. The following medications were removed from the  medication list:    Prednisone 10 Mg Tabs (Prednisone) .Marland Kitchen... Taper as follows:  6-5-4-4-3-3-2-1 Her updated medication list for this problem includes:    Claritin 10 Mg Tabs (Loratadine) .Marland Kitchen... Take 1 tablet by mouth daily as needed  Complete Medication List: 1)  Toprol Xl 200 Mg Tb24 (Metoprolol succinate) .... Take 1 tablet by mouth once a day 2)  Furosemide 40 Mg Tabs (Furosemide) .... Take 1 tablet by mouth once a day 3)  Klor-con 10 10 Meq Tbcr (Potassium chloride) .... Take 1 tablet by mouth once a day 4)  Aciphex 20 Mg Tbec (Rabeprazole sodium) .... Take 1 tablet by mouth once a day 5)  Effexor Xr 75 Mg Xr24h-cap (Venlafaxine hcl) .... Once daily 6)  One-a-day Extras Antioxidant Caps (Multiple vitamins-minerals) .... Take 1 capsule by mouth once a day 7)  Rhinocort Aqua 32 Mcg/act Susp (Budesonide (nasal)) .... Take 2 sprays in each nostril once a day 8)  Vesicare 5 Mg Tabs (Solifenacin succinate) .... Take 1 tablet by mouth once a day 9)  Claritin 10 Mg Tabs (Loratadine) .... Take 1 tablet by mouth daily as needed 10)  Valium 5 Mg Tabs (Diazepam) .... Take 1/2 tablet daily 11)  Avalide 300-25 Mg Tabs (Irbesartan-hydrochlorothiazide) .... Take 1/2 tab once daily 12)  Albuterol 90 Mcg/act Aers (Albuterol) .... Inhale 2 puffs daily 13)  Hydrocodone-acetaminophen 10-500 Mg Tabs (Hydrocodone-acetaminophen) .... As needed 14)  Estrace 2 Mg Tabs (Estradiol) .... 1/2 tab in an, 1/2 tab in pm 15)  Naftin 1 % Gel (Naftifine hcl) .... Apply to affected rash two times a day  Patient Instructions: 1)  Continue cortisone cream to right elbow rash. 2)  Use prescription cream for rash under breasts and groin region. Keep area dry as possible. Prescriptions: NAFTIN 1 % GEL (NAFTIFINE HCL) apply to affected rash two times a day  #15 gm x 1   Entered and Authorized by:   Evelena Peat MD   Signed by:   Evelena Peat MD on 09/06/2008   Method used:   Electronically to        [facility] (retail)       [address]       [place], [place]  [address]       Ph: [phone]       Fax: [phone]   RxID:   [id number]

## 2010-02-23 NOTE — Progress Notes (Signed)
Summary: REFILL Aciphex X 1 year  Phone Note Refill Request Message from:  Fax from Pharmacy  Refills Requested: Medication #1:  ACIPHEX 20 MG  TBEC Take 1 tablet by mouth once a day [facility]  FAX---[phone]  Initial call taken by: Warnell Forester,  March 21, 2009 8:30 AM    Prescriptions: ACIPHEX 20 MG  TBEC (RABEPRAZOLE SODIUM) Take 1 tablet by mouth once a day  #90 x 3   Entered by:   Sid Falcon LPN   Authorized by:   Evelena Peat MD   Signed by:   Sid Falcon LPN on 29/56/2130   Method used:   Electronically to        [facility]* (retail)       [address]       [place], [place]  [address]       Ph: [phone]       Fax: [phone]   RxID:   [id number]   Appended Document: REFILL Aciphex X 1 year    Prescriptions: ACIPHEX 20 MG  TBEC (RABEPRAZOLE SODIUM) Take 1 tablet by mouth once a day  #90 x 3   Entered by:   Sid Falcon LPN   Authorized by:   Evelena Peat MD   Signed by:   Sid Falcon LPN on 63/87/5643   Method used:   Electronically to        [facility]* (mail-order)             ,          Ph: [phone]       Fax: [phone]   RxID:   [id number]   Rx sent by mistake to [facility], should have gone to [facility].  Rx sent electronically to [facility] LPN  March 21, 2009 10:54 AM

## 2010-04-03 ENCOUNTER — Other Ambulatory Visit: Payer: Self-pay | Admitting: *Deleted

## 2010-04-03 MED ORDER — POTASSIUM CHLORIDE ER 10 MEQ PO TBCR: 10.0000 meq | EXTENDED_RELEASE_TABLET | Freq: Every day | ORAL | Status: DC

## 2010-04-03 MED ORDER — METFORMIN HCL 500 MG PO TABS: 500.0000 mg | ORAL_TABLET | Freq: Two times a day (BID) | ORAL | Status: DC

## 2010-04-10 LAB — CBC
HCT: 37.5 % (ref 36.0–46.0)
Hemoglobin: 12.3 g/dL (ref 12.0–15.0)
MCHC: 32.9 g/dL (ref 30.0–36.0)
MCV: 81.2 fL (ref 78.0–100.0)
Platelets: 199 10*3/uL (ref 150–400)
RBC: 4.61 MIL/uL (ref 3.87–5.11)
RDW: 14.7 % (ref 11.5–15.5)
WBC: 3.6 10*3/uL — ABNORMAL LOW (ref 4.0–10.5)

## 2010-04-10 LAB — BASIC METABOLIC PANEL
BUN: 9 mg/dL (ref 6–23)
CO2: 29 mEq/L (ref 19–32)
Calcium: 8.4 mg/dL (ref 8.4–10.5)
Chloride: 98 mEq/L (ref 96–112)
Creatinine, Ser: 1.15 mg/dL (ref 0.4–1.2)
GFR calc Af Amer: 57 mL/min — ABNORMAL LOW (ref >60)
GFR calc non Af Amer: 47 mL/min — ABNORMAL LOW (ref >60)
Glucose, Bld: 195 mg/dL — ABNORMAL HIGH (ref 70–99)
Potassium: 3.5 mEq/L (ref 3.5–5.1)
Sodium: 139 mEq/L (ref 135–145)

## 2010-04-10 LAB — DIFFERENTIAL
Basophils Absolute: 0 10*3/uL (ref 0.0–0.1)
Basophils Relative: 0 % (ref 0–1)
Eosinophils Absolute: 0.1 10*3/uL (ref 0.0–0.7)
Eosinophils Relative: 2 % (ref 0–5)
Lymphocytes Relative: 30 % (ref 12–46)
Lymphs Abs: 1.1 10*3/uL (ref 0.7–4.0)
Monocytes Absolute: 0.2 10*3/uL (ref 0.1–1.0)
Monocytes Relative: 6 % (ref 3–12)
Neutro Abs: 2.2 10*3/uL (ref 1.7–7.7)
Neutrophils Relative %: 62 % (ref 43–77)

## 2010-04-10 LAB — D-DIMER, QUANTITATIVE: D-Dimer, Quant: 0.42 ug/mL-FEU (ref 0.00–0.48)

## 2010-04-26 ENCOUNTER — Other Ambulatory Visit: Payer: Self-pay | Admitting: Family Medicine

## 2010-04-30 LAB — CBC
HCT: 34.9 % — ABNORMAL LOW (ref 36.0–46.0)
HCT: 37.7 % (ref 36.0–46.0)
Hemoglobin: 11.4 g/dL — ABNORMAL LOW (ref 12.0–15.0)
Hemoglobin: 12.6 g/dL (ref 12.0–15.0)
MCHC: 32.8 g/dL (ref 30.0–36.0)
MCHC: 33.3 g/dL (ref 30.0–36.0)
MCV: 82.5 fL (ref 78.0–100.0)
MCV: 82.9 fL (ref 78.0–100.0)
Platelets: 181 10*3/uL (ref 150–400)
Platelets: 198 10*3/uL (ref 150–400)
RBC: 4.23 MIL/uL (ref 3.87–5.11)
RBC: 4.55 MIL/uL (ref 3.87–5.11)
RDW: 13.7 % (ref 11.5–15.5)
RDW: 13.5 % (ref 11.5–15.5)
WBC: 9.9 10*3/uL (ref 4.0–10.5)
WBC: 5 10*3/uL (ref 4.0–10.5)

## 2010-04-30 LAB — BASIC METABOLIC PANEL
BUN: 18 mg/dL (ref 6–23)
BUN: 13 mg/dL (ref 6–23)
CO2: 26 mEq/L (ref 19–32)
CO2: 29 mEq/L (ref 19–32)
Calcium: 8.1 mg/dL — ABNORMAL LOW (ref 8.4–10.5)
Calcium: 8.8 mg/dL (ref 8.4–10.5)
Chloride: 104 mEq/L (ref 96–112)
Chloride: 103 mEq/L (ref 96–112)
Creatinine, Ser: 1.04 mg/dL (ref 0.4–1.2)
Creatinine, Ser: 1.1 mg/dL (ref 0.4–1.2)
GFR calc Af Amer: >60 mL/min (ref >60)
GFR calc Af Amer: >60 mL/min (ref >60)
GFR calc non Af Amer: 53 mL/min — ABNORMAL LOW (ref >60)
GFR calc non Af Amer: 50 mL/min — ABNORMAL LOW (ref >60)
Glucose, Bld: 163 mg/dL — ABNORMAL HIGH (ref 70–99)
Glucose, Bld: 83 mg/dL (ref 70–99)
Potassium: 3.7 mEq/L (ref 3.5–5.1)
Potassium: 3.6 mEq/L (ref 3.5–5.1)
Sodium: 138 mEq/L (ref 135–145)
Sodium: 139 mEq/L (ref 135–145)

## 2010-04-30 LAB — TYPE AND SCREEN
ABO/RH(D): O POS
Antibody Screen: NEGATIVE

## 2010-04-30 LAB — ABO/RH: ABO/RH(D): O POS

## 2010-06-06 NOTE — Discharge Summary (Signed)
NAMEJARETZY, LHOMMEDIEU NO.:  [id number]   MEDICAL RECORD NO.:  [id number]          PATIENT TYPE:  INP   LOCATION:  3021                         FACILITY:  [facility]   PHYSICIAN:  Cristi Loron, M.D.DATE OF BIRTH:  [birth year]   DATE OF ADMISSION:  08/23/2008  DATE OF DISCHARGE:  08/26/2008                               DISCHARGE SUMMARY   BRIEF HISTORY:  The patient is a 70 year old black female who has been  diagnosed with multiple meningioma.  She has been followed with serial  imaging studies and the left posterior frontal/parietal lesion has  enlarged.  I recommended surgery.  The patient weighed the risks,  benefits, and alternatives of surgery and decided to proceed with the  operation.   For further details of this admission, please refer to the typed history  and physical.   HOSPITAL COURSE:  Admitted the patient to [facility] on August 23, 2008.  On the day of admission, I performed a left parietal  craniotomy to remove to extra-axial lesions consistent with meningioma  (the final pathology is pending).  The surgery went well (for full  details of this operation, please refer to the typed operative note).   Postop course, the patient's postop course was unremarkable.  She is  discharged home on August 26, 2008, at her at her request.   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   PROCEDURE PERFORMED:  Left parietal craniotomy for gross total resection  of left posterior frontal tumor.   DISCHARGE INSTRUCTIONS:  The patient was given written discharge  instructions and instructed to follow up with me in 1-2 weeks to remove  staples.   DISCHARGE PRESCRIPTIONS:  Lortab 10, #50, 1 p.o. q.4 h. p.r.n. for pain,  no refill.      Cristi Loron, M.D.  Electronically Signed     JDJ/MEDQ  D:  08/26/2008  T:  08/26/2008  Job:  [id number]

## 2010-06-06 NOTE — Op Note (Signed)
Dawn Salas, KERNEY NO.:  [id number]   MEDICAL RECORD NO.:  [id number]          PATIENT TYPE:  INP   LOCATION:  3108                         FACILITY:  [facility]   PHYSICIAN:  Cristi Loron, M.D.DATE OF BIRTH:  [birth year]   DATE OF PROCEDURE:  08/23/2008  DATE OF DISCHARGE:                               OPERATIVE REPORT   BRIEF HISTORY:  The patient is a 70 year old white female who has had  known multiple intracranial lesions presumably hemangioma but they were  small and we followed these with serial MRI scans.  The patient's left  parietal lesion has enlarged significantly.  I discussed this with the  patient and recommend she undergo a craniotomy to remove this lesion.  I  described that surgery to her as well as the risks, benefits, and  alternatives.  The patient decided to proceed with surgery.   PREOPERATIVE DIAGNOSIS:  Left parietal supratentorial extra-axial  intracranial lesion/tumor.   POSTOPERATIVE DIAGNOSIS:  Left parietal supratentorial extra-axial  intracranial lesion/tumor.   PROCEDURE:  Left parietal craniotomy for gross total resection of tumor  using microdissection and Stealth Neuronavigation.   SURGEON:  Cristi Loron, MD   ASSISTANT:  Clydene Fake, MD   ANESTHESIA:  General endotracheal.   ESTIMATED BLOOD LOSS:  100 mL.   SPECIMENS:  Tumor.   DRAINS:  None.   COMPLICATIONS:  None.   DESCRIPTION OF PROCEDURE:  The patient was brought to the operating room  by the Anesthesia Team.  General endotracheal anesthesia was induced.  The patient's calvarium was supported in the Mayfield 3-point headrest.  The patient remained in supine position with molars facing the ceiling.  The patient's left scalp was then shaved with clippers.  We then entered  the registration points on the Stealth Neuronavigational System.  The  patient's left scalp was then prepared with Betadine scrub and Betadine  solution.  Sterile drapes were  applied.  We then injected the area to be  incised with Marcaine with epinephrine solution.  We used the scalpel to  make a linear midline incision in the left parietal region.  We used  Raney clips for wound edge hemostasis and used periosteal elevator to  expose the underlying calvarium.  We used a cerebellar retractor for  exposure.  We again confirmed the adequacy of the incision and exposed  the cranium by using the Stealth Neuronavigation.  We used the high-  speed drill to create a left parietal bur hole.  We used a footplate  device to create a left parietal craniotomy flap.  We elevated the  craniotomy flap with a Penfield #1 and Penfield #3.  The dura was  slightly tense, so we gave 12.5 grams of mannitol.  The dura became  soft.  We then incised the dura with a #15 blade scalpel.  In the center  of the craniotomy flap, we encountered the larger extraction of brain  lesion.  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  There was a second more cephalad lesion  which was right at the  edge of the craniotomy flap, we therefore used a  high-speed drill to extend the craniectomy in a cephalad direction to  expose more of this lesion.  I incised the dura over this and removed  the lesion using suction and irrigation with bipolar electrocautery and  microdissection.  This more cephalad lesion was little more adherent to  the peeled surface of the brain and somewhat adherent to a superficial  vein.  We were able to get a good dissection and resection of both these  lesions.  I should mention that we did this dissection under the  magnification of illumination of the microscope.  We then obtained  hemostasis using bipolar electrocautery.  We coagulated the edges of  dura.  We removed a large piece of dura about the size of the craniotomy  flap.  We then laid a large piece of DuraGen over this dural defect and  then reapproximated the patient's  craniotomy flap with titanium mini  plate and screws.  We then removed the retractor and then reapproximated  the patient's galea with interrupted 2-0 Vicryl suture and the skin with  stainless steel staples.  The wound was then coated with bacitracin  ointment.  A sterile dressing was applied, the drapes were removed, and  the Mayfield 3-point headrest was removed from the patient's calvarium.  The patient was subsequently extubated by the Anesthesia Team and  transported to the Postanesthesia Care Unit in stable condition.  All  sponge, instrument, and needle counts were correct at the end of this  case.      Cristi Loron, M.D.  Electronically Signed     JDJ/MEDQ  D:  08/23/2008  T:  08/24/2008  Job:  [id number]

## 2010-06-09 ENCOUNTER — Encounter (HOSPITAL_COMMUNITY)
Admission: RE | Admit: 2010-06-09 | Discharge: 2010-06-09 | Disposition: A | Discharge status: Home / Self Care | Payer: 59 | Source: Ambulatory Visit | Attending: Orthopedic Surgery | Admitting: Orthopedic Surgery

## 2010-06-09 ENCOUNTER — Other Ambulatory Visit (HOSPITAL_COMMUNITY): Payer: Self-pay | Admitting: Orthopedic Surgery

## 2010-06-09 ENCOUNTER — Ambulatory Visit (HOSPITAL_COMMUNITY)
Admission: RE | Admit: 2010-06-09 | Discharge: 2010-06-09 | Disposition: A | Discharge status: Home / Self Care | Payer: 59 | Source: Ambulatory Visit | Attending: Orthopedic Surgery | Admitting: Orthopedic Surgery

## 2010-06-09 DIAGNOSIS — Z01812 Encounter for preprocedural laboratory examination: Secondary | ICD-10-CM | POA: Insufficient documentation

## 2010-06-09 DIAGNOSIS — M654 Radial styloid tenosynovitis [de Quervain]: Secondary | ICD-10-CM

## 2010-06-09 DIAGNOSIS — Z01818 Encounter for other preprocedural examination: Secondary | ICD-10-CM | POA: Insufficient documentation

## 2010-06-09 LAB — COMPREHENSIVE METABOLIC PANEL
ALT: 13 U/L (ref 0–35)
AST: 20 U/L (ref 0–37)
Albumin: 4 g/dL (ref 3.5–5.2)
Alkaline Phosphatase: 93 U/L (ref 39–117)
BUN: 15 mg/dL (ref 6–23)
CO2: 28 mEq/L (ref 19–32)
Calcium: 9.3 mg/dL (ref 8.4–10.5)
Chloride: 98 mEq/L (ref 96–112)
Creatinine, Ser: 1.3 mg/dL — ABNORMAL HIGH (ref 0.4–1.2)
GFR calc Af Amer: 49 mL/min — ABNORMAL LOW (ref >60)
GFR calc non Af Amer: 41 mL/min — ABNORMAL LOW (ref >60)
Glucose, Bld: 79 mg/dL (ref 70–99)
Potassium: 3.2 mEq/L — ABNORMAL LOW (ref 3.5–5.1)
Sodium: 138 mEq/L (ref 135–145)
Total Bilirubin: 0.5 mg/dL (ref 0.3–1.2)
Total Protein: 6.7 g/dL (ref 6.0–8.3)

## 2010-06-09 LAB — DIFFERENTIAL
Basophils Absolute: 0 10*3/uL (ref 0.0–0.1)
Basophils Relative: 0 % (ref 0–1)
Eosinophils Absolute: 0.2 10*3/uL (ref 0.0–0.7)
Eosinophils Relative: 3 % (ref 0–5)
Lymphocytes Relative: 40 % (ref 12–46)
Lymphs Abs: 2 10*3/uL (ref 0.7–4.0)
Monocytes Absolute: 0.2 10*3/uL (ref 0.1–1.0)
Monocytes Relative: 5 % (ref 3–12)
Neutro Abs: 2.5 10*3/uL (ref 1.7–7.7)
Neutrophils Relative %: 51 % (ref 43–77)

## 2010-06-09 LAB — PROTIME-INR
INR: 0.95 (ref 0.00–1.49)
Prothrombin Time: 12.9 seconds (ref 11.6–15.2)

## 2010-06-09 LAB — APTT: aPTT: 32 seconds (ref 24–37)

## 2010-06-09 LAB — SURGICAL PCR SCREEN
MRSA, PCR: NEGATIVE
Staphylococcus aureus: NEGATIVE

## 2010-06-09 LAB — CBC
HCT: 37.7 % (ref 36.0–46.0)
Hemoglobin: 12.6 g/dL (ref 12.0–15.0)
MCH: 26.4 pg (ref 26.0–34.0)
MCHC: 33.4 g/dL (ref 30.0–36.0)
MCV: 78.9 fL (ref 78.0–100.0)
Platelets: 211 10*3/uL (ref 150–400)
RBC: 4.78 MIL/uL (ref 3.87–5.11)
RDW: 13.1 % (ref 11.5–15.5)
WBC: 4.9 10*3/uL (ref 4.0–10.5)

## 2010-06-09 NOTE — Op Note (Signed)
NAME:  Dawn Salas, Dawn Salas                          ACCOUNT NO.:  [id number]   MEDICAL RECORD NO.:  [id number]                   PATIENT TYPE:  INP   LOCATION:  3032                                 FACILITY:  [facility]   PHYSICIAN:  Cristi Loron, M.D.            DATE OF BIRTH:  08-Oct-1940   DATE OF PROCEDURE:  DATE OF DISCHARGE:  08/29/2001                                 OPERATIVE REPORT   PREOPERATIVE DIAGNOSES:  C5-6 and C6-7 degenerative disk disease,  spondylosis, stenosis, cervical radiculopathy and cervicalgia.   POSTOPERATIVE DIAGNOSES:  C5-6 and C6-7 degenerative disk disease,  spondylosis, stenosis, cervical radiculopathy and cervicalgia.   OPERATION PERFORMED:  C5-6 and C6-7 extensive anterior cervical diskectomy,  interbody iliac crest allograft arthrodesis, and anterior cervical plating.   SURGEON:  Cristi Loron, M.D.   ASSISTANT:  Clydene Fake, M.D.   ANESTHESIA:  General endotracheal.   ESTIMATED BLOOD LOSS:  200 cc.   SPECIMENS:  None.   DRAINS:  None.   COMPLICATIONS:  None.   INDICATIONS FOR PROCEDURE:  The patient is a 70 year old black female who  has suffered from neck and arm pain.  She has failed medical management and  was worked up with a cervical MRI which demonstrated significant  degenerative disease, spondylosis and stenosis at C5-6 and C6-7.  I  discussed the various treatment options with her, including surgery.  The  patient weighed the risks, benefits, and alternatives to surgery and decided  to proceed with the operation.   DESCRIPTION OF PROCEDURE:  The patient was brought to the operating room by  the anesthesia team.  General orotracheal anesthesia was induced.  The  patient remained in a supine position.  A roll was placed under her  shoulders to place her neck in slight extension.  The anterior cervical  region was then prepared with Betadine solution.  Sterile drapes were  applied.  I then injected the area to be incised  with Marcaine with  epinephrine solution and used a scalpel to make a transverse incision in the  patient's left anterior neck.  Using the Metzenbaum scissors to divide the  platysma muscle and then to dissect medial to the sternocleidomastoid muscle  revealing the vein and carotid artery.  That was taken down to the anterior  cervical spine.  We carefully identified the esophagus and retracted it  medially and used the Kitner swabs to clear soft tissue from the anterior  cervical spine and inserted a spinal needle into the upper exposed  interspace.  I then took intraoperative x-ray to confirm my location, then  tried to detach the medial border of the longus colli muscles bilaterally  from C5-6 and C6-7 intervertebral disk space and then inserted the Caspar  retractor for exposure.  We began at C5-6; but both these x-rays were done  in identical fashion, except obviously at different levels.  I will describe  the  one for C5-6.  I incised the C5-6 intervertebral disk space with the 15  blade scalpel.  I performed a partial diskectomy with the pituitary forceps  and the Carlens curets.  I then inserted retractors at C5 and C6 distracting  the interspace and then used high speed drill to decorticate the C5-6  vertebral end plate and drill the remainder of the C5-6 intervertebral disk.  There was considerable spondylosis vertebral end plates.  I drilled above  posteriorly and then thinned out the posterior longitudinal ligament with  the drill, incised it with the arachnoid knife and removed them with the  Kerrison punches undercutting the vertebral end plates decompressing the  thecal sac at C5-6.  I then performed a generous foraminotomy about the  bilateral C6 nerve roots.  This procedure was done identically again at C6-7  decompressing the thecal sac and the bilateral C7 nerve roots.  Again there  was considerable spondylosis at this level.   We now turned our attention to the  arthrodesis.  We __________ bone graft  __________.  7 mm in height, 1 cm in depth and bone grafts were placed into  the distracted interspaces.  The distracting system was removed and it was  noted that there was __________ bone graft at both levels.   We now turned our attention to the anterior spinal instrumentation.  We  obtained the appropriate length Synthes titanium anterior cervical plate,  laid it along the anterior aspect of the vertebral bodies, then drilled two  holes at C5, two at C6, two at C7, tapped the holes and inserted 14 mm  screws, two at each level and then obtained intraoperative radiograph.  Because of the patient's body habitus we could not see the lower screws but  the other screws looked good on the x-ray and the lower screws looked good  in vivo and I have secured the screws to the plate by placing locking screws  at each screw.   We now achieved stringent hemostasis using bipolar cautery electrocautery  and Gelfoam.  The wound was copiously irrigated out with bacitracin  solution.  Fluid was removed as was the Engineer, technical sales.  We  inspected the esophagus for damage.  There was none apparent, and then  reapproximated the patient's platysma muscle with interrupted 3-0 Vicryl  sutures, the subcutaneous tissues with interrupted 3-0 Vicryl suture and the  skin with Steri-Strips and benzoin.  The wound was then coated with  bacitracin ointment, a sterile dressing was applied.  The drapes were  removed and the patient was subsequently by the anesthesia team and  transported to the post anesthesia care unit in stable condition.  Sponge,  needle and instrument counts were correct at the end of this case.                                                 Cristi Loron, M.D.    JDJ/MEDQ  D:  08/28/2001  T:  09/01/2001  Job:  [id number]

## 2010-06-09 NOTE — Op Note (Signed)
NAMERAGEN, LAVER                ACCOUNT NO.:  [id number]   MEDICAL RECORD NO.:  [id number]          PATIENT TYPE:  AMB   LOCATION:  SDS                          FACILITY:  [facility]   PHYSICIAN:  Burnard Bunting, M.D.    DATE OF BIRTH:  October 29, 1940   DATE OF PROCEDURE:  11/20/2005  DATE OF DISCHARGE:  11/20/2005                               OPERATIVE REPORT   PREOPERATIVE DIAGNOSIS:  Lateral meniscal tear.   POSTOPERATIVE DIAGNOSIS:  Lateral meniscal tear.   PROCEDURE:  Right knee diagnostic and operative arthroscopy with partial  lateral meniscectomy.   ATTENDING SURGEON:  Burnard Bunting, M.D.   ASSISTANT:  None.   ANESTHESIA:  MAC plus local.   ESTIMATED BLOOD LOSS:  Minimal.   INDICATIONS:  Dawn Salas is a 70 year old female with right knee pain,  with MRI scan consistent with a lateral meniscal tear, which is  unstable.  She presents now for operative management after explanation  of risks and benefits.   PROCEDURE IN DETAIL:  The patient was brought to the operating room,  where MAC plus local anesthetic were induced.  The right leg was prepped  with DuraPrep solution and completely prepped and draped in a sterile  manner.  An anterior inferolateral portal and anterior inferomedial  portals were created.  Diagnostic arthroscopy was performed.  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  There was also mild grade 2 to 3 chondromalacia  on the medial tibial plateau, but without unstable meniscal pathology.  The ACL and PCL were intact.  The lateral compartment demonstrated a  lateral meniscal tear, which was unstable and involved the majority of  the lateral meniscus.  This was debrided back to a stable rim.  Bleeding  points encountered were controlled using cautery.  The knee joint was  thoroughly irrigated.  The articular surface between the femur and the  tibia was generally intact.  The instruments were removed.  The  portals  were irrigated.  The incisions were closed using 3-0 nylon suture.  A  bulky dressing was applied.  A solution of Marcaine, morphine and  clonidine was injected into the knee.      Burnard Bunting, M.D.  Electronically Signed     GSD/MEDQ  D:  01/08/2006  T:  01/08/2006  Job:  [id number]

## 2010-06-09 NOTE — Assessment & Plan Note (Signed)
[facility]                               PULMONARY OFFICE NOTE   Dawn Salas, Dawn Salas                       MRN:          [id number]  DATE:10/05/2005                            DOB:          [birth year]    SUBJECTIVE:  Dawn Salas is a pleasant 70 year old woman with a history of  obesity, obstructive sleep apnea, allergic rhinitis, and vocal cord  dysfunction.  She tells me that since our last visit her breathing has done  fairly well.  She has not had any more episodes of her characteristic upper  airway distress, although she is concerned that this may become problematic  again when she begins to do her next swimming competition.  She has started  on CPAP at 14 cmH2O and seems to be tolerating it well.  She says that her  daytime sleepiness and fatigue have improved significantly.  She still has  some postnasal drip on Singulair, Claritin and Rhinocort spray.  Her GERD  appears to be fairly well-controlled.   MEDICATIONS:  1. Singulair 10 mg daily.  2. Avapro 300 mg daily.  3. Avapro 300 mg daily.  4. Toprol XL 200 mg daily.  5. Lasix 40 mg daily.  6. Potassium 10 mEq daily.  7. Tiazac 120 mg daily.  8. AcipHex 20 mg daily.  9. Effexor 75 mg daily.  10.Multivitamin one daily.  11.Rhinocort one spray to each nostril daily.  12.Vesicare, dose not known.  13.Claritin OTC 10 mg one daily.   PHYSICAL EXAMINATION:  GENERAL:  This is a pleasant woman who is in no  distress.  VITAL SIGNS:  Her weight is 236 pounds, which is down 8 pounds from her last  visit.  Her temperature is 97.8, blood pressure 118/90, heart rate 68,  oxygen saturation is 98% on room air.  HEENT:  There is no stridor.  Her oropharynx is clear.  LUNGS:  Clear to auscultation bilaterally.  CARDIAC:  Her heart has a regular rate and rhythm without murmur.  ABDOMEN:  Obese, soft, nontender, with positive bowel sounds.  EXTREMITIES:  No cyanosis, clubbing or edema.   IMPRESSION:  1. Obstructive sleep apnea, now on CPAP therapy.  2. 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  3. Gastroesophageal reflux disease, which appears to be well-controlled.  4. Allergic rhinitis with continued clear postnasal drip.   PLANS:  1. Continue her CPAP as ordered.  She will work with Christoper Allegra to see if she      can improve the heated humidity because she has been complaining of dry      mouth.  2. Continue AcipHex.  3. Continue Singulair, Claritin and Rhinocort.  We will also start nasal      saline spray to clear nasal secretions prior to using her Rhinocort.  4. She will continue her relaxation techniques for her vocal cord      dysfunction, especially prior to her swimming competition.  5. She will follow up with  me in 6 months or sooner should she have any      difficulties.                                   Leslye Peer, MD   RSB/MedQ  DD:  10/05/2005  DT:  10/06/2005  Job #:  [id number]   cc:   Evelena Peat, M.D.  Crecencio Mc, M.D.  Nanetta Batty, M.D.

## 2010-06-09 NOTE — Procedures (Signed)
NAMEJERMIKA, OLDEN NO.:  [id number]   MEDICAL RECORD NO.:  [id number]          PATIENT TYPE:  OUT   LOCATION:  SLEEP CENTER                 FACILITY:  [facility]   PHYSICIAN:  Marcelyn Bruins, M.D. [facility] DATE OF BIRTH:  [birth year]   DATE OF STUDY:  07/11/2005                              NOCTURNAL POLYSOMNOGRAM    REFERRING PHYSICIAN:  Dr. Levy Pupa   INDICATION FOR THE STUDY:  Hypersomnia with sleep apnea.   EPWORTH SLEEPINESS SCORE:  13.   SLEEP ARCHITECTURE:  The patient had a total sleep time of 324 minutes with  decreased slow-wave sleep as well as REM.  Sleep onset latency was prolonged  at 53 minutes and REM onset was very prolonged at 363 minutes.  Sleep  efficiency was significantly decreased at 71%.   RESPIRATORY DATA:  The patient was found to have 112 hypopneas and 11 apneas  for a respiratory disturbance index of 23 events per hour.  The events were  not positional and there was mild snoring noted throughout.   OXYGEN DATA:  The patient had O2 desaturation as low as 90% with her  obstructive events.   CARDIAC DATA:  No clinically significant cardiac arrhythmias.   MOVEMENTS/PARASOMNIAS:  Patient was found to have 230 leg jerks but with  very little sleep disruption.   IMPRESSION:  1.  Moderate obstructive sleep apnea/hypopnea syndrome with a respiratory      disturbance index of 23 events per hour and O2 desaturation as low as      90%.  Treatment for this degree of sleep apnea should focus primarily on      weight loss alone, if applicable, upper airway surgery, oral appliance,      and also CPAP.  Clinical correlation is suggested.  2.  Large numbers of leg jerks with what appears to be very little sleep      disruption.  This may simply be a manifestation of the patient's sleep      disorder breathing, however a detailed history should be done to make      sure that she does not have symptoms consistent with restless leg       syndrome.           ______________________________  Marcelyn Bruins, M.D. [facility]  Diplomate, American Board of Sleep  Medicine    KC/MEDQ  D:  07/27/2005 11:25:33  T:  07/27/2005 11:41:14  Job:  [id number]

## 2010-06-09 NOTE — Consult Note (Signed)
[facility]  Patient:    Dawn Salas, Dawn Salas Visit Number: [id number] MRN: [id number]          Service Type: PMG Location: [facility] Attending Physician:  Sondra Come Dictated by:   Sondra Come, D.O. Proc. Date: 08/14/01 Admit Date:  08/13/2001   CC:         PAIN MANAGEMENT  Cristi Loron, M.D.   Consultation Report  Dear Dr. Lovell Sheehan:  Thank you very much for kindly referring Dawn Salas to [facility] for a right trochanteric bursal steroid injection.  The patient was seen in the clinic today and underwent an uneventful a right trochanteric bursal steroid injection for trochanteric bursitis.  Please refer to the following for details regarding the history and physical examination and treatment.  Once again, thank you for allowing [place] to participate in the care of Dawn Salas.  CHIEF COMPLAINT:  Right hip pain.  HISTORY OF PRESENT ILLNESS:  Dawn Salas is a pleasant 70 year old female, who is status post L4-5 fusion in December 2002, and has been doing quite well in regard to her low back and lower extremity pain.  However, she has been complaining of right hip pain and was felt to have a right trochanteric bursitis and was referred for a trial of trochanteric bursal steroid injection.  When describing her hip pain, she points to her greater trochanter on the right.  She states it is hard to sleep on her left side with her right lower extremity unsupported.  In addition, she is scheduled to undergo cervical spine fusion on August 29, 2001, and complains of carpal tunnel syndrome bilaterally.  Her symptoms are described as constant with associated numbness mainly in her upper extremities.  Her symptoms in her hip are worse with bending and working and improved with medications which currently include hydrocodone as prescribed by Dr. Lovell Sheehan.  I reviewed the health and history form and 14 point review of  systems.  PAST MEDICAL HISTORY: 1. Hypercholesterolemia. 2. Gastroesophageal reflux disease with reported ulcer. 3. Hypertension. 4. Palpitations.  PAST SURGICAL HISTORY: 1. Lumbar fusion at L4-5 with pedicle screws. 2. Right knee surgery x 2. 3. Right foot surgery x 2. 4. Hysterectomy. 5. Tonsillectomy. 6. Right rotator cuff repair.  FAMILY HISTORY:  Heart disease, diabetes, hypertension.  SOCIAL HISTORY:  Denies smoking or alcohol use.  She is married and is not currently working but previously was working at [employer] prior to her lumbar surgery.  ALLERGIES:  MORPHINE, PENICILLIN.  MEDICATIONS:  Lipitor, Avapro, Ditropan, Toprol XL, furosemide, potassium, Tiazac, Aciphex, Effexor, black cohosh, vitamin E, Geritol, Singulair, cyclobenzaprine, diazepam, Claritin D, hydrocodone.  PHYSICAL EXAMINATION:  GENERAL:  An obese female in no acute distress.  VITAL SIGNS:  Blood pressure 147/61, pulse 84, respirations 16, O2 saturations 97% on room air.  BACK:  A midline vertical incisional scar with minimal tenderness to palpation, bilateral lumbar paraspinals, and gluteal muscles.  There is significant tenderness to palpation over the right greater trochanter.  Range of motion of the lumbar spine is guarded in all planes.  MANUAL MUSCLE TESTING:  5/5 bilateral lower extremities in all muscle groups tested.  SENSORY:  Intact to light touch bilateral lower extremities in all dermal distributions at this time.  MUSCLE STRETCH REFLEXES:  2+/4 bilateral patella, medial hamstrings, and Achilles.  Straight leg raise is negative bilaterally but with tight hamstrings.  Pearlean Brownie is negative bilaterally.  There is full range of  motion of the hips bilaterally with mild discomfort over the right greater trochanter with right hip adduction.  IMPRESSION: 1. Right trochanteric bursitis. 2. Status post L4-5 fusion. 3. 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  PLAN: 1. Right trochanteric bursal steroid injection. 2. Follow up with Dr. Lovell Sheehan.  Would consider repeating bursal steroid    injection in 1-2 months if symptoms return.  Would also consider physical    therapy for ultrasound if symptoms do not improve. 3. The patient to return to clinic as needed.  PROCEDURE:  Right trochanteric bursal steroid injection.  Risks, benefits, limitations, and alternatives were discussed.  The patient wishes to proceed. Informed consent was obtained.  DESCRIPTION OF PROCEDURE:  The skin was prepped in usual fashion with Betadine and alcohol swabs.  The right trochanteric bursa was injected with 1 cc of Kenalog 40 mg/cc plus 2 cc of 1% lidocaine plus 1 cc of 0.25% bupivacaine, using a 25 gauge spinal needle.  The patient tolerated the procedure well. Discharge instructions were given.  The patient instructed on postinjection, relative rest x 48-72 hours.  Also instructed on the use of ice as needed for rebound pain.  The patient released in stable condition.  The patient was educated on the above findings and recommendations and understands.  No barriers to communication. Dictated by:   Sondra Come, D.O. Attending Physician:  Sondra Come DD:  08/14/01 TD:  08/18/01 Job: [id number]

## 2010-06-09 NOTE — Op Note (Signed)
[name], [name]                ACCOUNT NO.:  [id number]   MEDICAL RECORD NO.:  [id number]          PATIENT TYPE:  AMB   LOCATION:  SDS                          FACILITY:  [facility]   PHYSICIAN:  Burnard Bunting, M.D.    DATE OF BIRTH:  25-Oct-1940   DATE OF PROCEDURE:  11/20/2005  DATE OF DISCHARGE:  11/20/2005                               OPERATIVE REPORT   PREOPERATIVE DIAGNOSIS:  Right knee lateral meniscal tear.   POSTOPERATIVE DIAGNOSIS:  Right knee lateral meniscal tear.   PROCEDURE:  Right knee diagnostic and operative arthroscopy with partial  lateral meniscectomy.   SURGEON:  Burnard Bunting, M.D.   ASSISTANT:  None.   ANESTHESIA:  MAC, plus local.   ESTIMATED BLOOD LOSS:  Minimal.   INDICATIONS:  Dawn Salas is a 70 year old female with right knee  locking, who presents now for operative management after failure of  conservative therapy.   OPERATIVE FINDINGS:  1. Examination under anesthesia, range of motion of 0 to 130 degrees      of flexion, stability to varus and valgus stress.  2. 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   PROCEDURE IN DETAIL:  The patient was brought to the operating room,  where MAC with local anesthetic was induced.  Preoperative IV  antibiotics were administered.  The right leg was prepped with DuraPrep  solution, including the foot, and draped in a sterile manner.  Topographic anatomy of the knee was identified, including the medial and  lateral margins of the patella and the  patellar tendon.  An anterior  inferolateral portal was first established.  An anterior inferomedial  portal was then established under direct visualization.  Diagnostic  arthroscopy was performed.  The medial compartment demonstrated severe  degenerative changes, but no loose chondral flaps and an intact medial  meniscus.  The ACL and PCL were intact.  The patellofemoral compartment  also had significant degenerative change with no loose chondral flap,  but grade 3 chondromalacia was present.  The lateral compartment  demonstrated a small focal chondral defect, the flaps of which were  debrided back, as well as a large  bucket-handle tear of the lateral meniscus, which was resected back to a  stable rim with a combination of the basket, punch and shaver.  The knee  joint was thoroughly irrigated and the instruments were removed.  The  portals were closed using 3-0 nylon.  A solution of Marcaine and  clonidine was injected in the knee.  A bulky wrap  was placed.      Burnard Bunting, M.D.  Electronically Signed     GSD/MEDQ  D:  11/20/2005  T:  11/20/2005  Job:  [id number]

## 2010-06-09 NOTE — Procedures (Signed)
Dawn Salas, Dawn Salas NO.:  [id number]   MEDICAL RECORD NO.:  [id number]          PATIENT TYPE:  OUT   LOCATION:  SLEEP CENTER                 FACILITY:  [facility]   PHYSICIAN:  Barbaraann Share, MD,FCCPDATE OF BIRTH:  1940/07/13   DATE OF STUDY:  09/04/2005                              NOCTURNAL POLYSOMNOGRAM    REFERRING PHYSICIAN:  Dr. Jasmine Awe.   INDICATION FOR STUDY:  Hypersomnia with sleep apnea. The patient was found  to have obstructive sleep apnea on a prior study and returns for CPAP  titration.   EPWORTH SCORE:  13.   SLEEP ARCHITECTURE:  The patient had a total sleep time of 350 minutes.  However, never achieved slow wave sleep or REM. Sleep onset latency was  normal at 23.5 minutes, and sleep efficiency was very decreased at 77%.   RESPIRATORY DATA:  The patient underwent CPAP titration with a small ResMed  Ultra Mirage full face mask. Pressure was ultimately increased to 14 cm of  water with excellent control of her obstructive events.   OXYGEN DATA:  There was O2 desaturation as low as 88% prior to the patient  reaching optimal CPAP pressure. When this was reached, there were no  significant O2 desaturations noted.   CARDIAC DATA:  No clinically significant cardiac arrhythmia  movement/parasomnia. The patient was found to have 54 leg jerks with less  than 1 per hour resulting in arousal or awakening.   IMPRESSION/RECOMMENDATIONS:  Good control of previously documented  obstructive sleep apnea with 14 cm of CPAP. A small ResMed Ultra Mirage full  face mask was used for the titration and seemed to be tolerated quite well.  The patient should also be counseled on weight reduction as well as avoiding  driving heavy machinery or cars while being sleepy.           ______________________________  Barbaraann Share, MD,FCCP  Diplomate, American Board of Sleep  Medicine    KMC/MEDQ  D:  09/11/2005 15:29:52  T:  09/12/2005 09:14:46  Job:  [id number]

## 2010-06-09 NOTE — Assessment & Plan Note (Signed)
[facility]                             PULMONARY OFFICE NOTE   Dawn Salas, Dawn Salas                       MRN:          [id number]  DATE:05/16/2006                            DOB:          Oct 02, 1940    SUBJECTIVE:  Dawn Salas is a 70 year old woman with a history of  allergic rhinitis, obstructive sleep apnea, vocal cord dysfunction,  upper airway irritation syndrome, and GERD. She was last seen in  September of 2007. She tells me today that she continues to have some  dry cough. She also has persistent nasal drainage and loses her voice  frequently. She recently has had headache without any fever, or clear  nasal drainage. This worsened yesterday and she believes that she may  have caught an upper respiratory infection from her daughter who has had  a cold. Her dry cough is worse and she lost her voice today. She has  been taking Ditropan for vertigo. She continues to have dryness with her  CPAP. I question as to whether she has an adequate mask fit. She wears  it every night, but she sometimes does take it off because it makes her  mouth sore.   MEDICATIONS:  1. Singulair 10 mg daily.  2. Toprol XL 200 mg daily.  3. Furosemide 40 mg daily.  4. Potassium chloride 10 mEq daily.  5. AcipHex 20 mg daily.  6. Effexor 75 mg daily.  7. Multivitamin once daily.  8. Rhinocort 1 spray to each nostril daily.  9. Vesicare 10 mg daily.  10.Claritin 10 mg daily.  11.Valium 2.5 mg b.i.d.  12.Avalide once daily.  13.Albuterol 2 puffs every 4 hours p.r.n.  14.Hydrocodone p.r.n.   PHYSICAL EXAMINATION:  GENERAL:  This is a pleasant obese woman in no  distress.  NECK:  Without strider.  HEENT:  She does have a hoarse voice and some evidence of post-nasal  drip.  LUNGS:  Clear to auscultation bilaterally.  HEART:  Regular rate and rhythm without murmur.  ABDOMEN:  Benign.  EXTREMITIES:  No cyanosis, clubbing, or edema.  VITAL SIGNS:  Weight 237 pounds,  temperature 98.6, blood pressure  134/86, heart rate 101, SPO2 98% on room air.   IMPRESSION:  1. Obstructive sleep apnea on CPAP.  2. 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  3. Allergic rhinitis and post-nasal drip which appears to be recently      exacerbated due to an upper respiratory infection.  4. GERD that appears to be well controlled.   PLANS:  1. I will ask [employer] to try and adjust Dawn Salas mask and we will      also increase her humidity setting to 3, to see if we can help with      her dryness using the CPAP.  2. She will continue her current allergy regimen.  3. She will use symptomatic relief for her upper respiratory      infection.  4. She will return to see me in six months or sooner should she have      any difficulty in the interim.  Leslye Peer, MD  Electronically Signed    RSB/MedQ  DD: 06/23/2006  DT: 06/24/2006  Job #: [id number]   cc:   Evelena Peat, M.D.  Crecencio Mc, M.D.  Nanetta Batty, M.D.

## 2010-06-09 NOTE — Op Note (Signed)
[place]. [facility]  Patient:    BARBEE, MAMULA Visit Number: [id number] MRN: [id number]          Service Type: SUR Location: 3000 3019 01 Attending Physician:  Cristi Loron Dictated by:   Cristi Loron, M.D. Proc. Date: 01/02/01 Admit Date:  01/02/2001                             Operative Report  PREOPERATIVE DIAGNOSES: 1. 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 2. Degenerative disk disease. 3. Spinal stenosis. 4. Lumbago. 5. Lumbar radiculopathy.  POSTOPERATIVE DIAGNOSES: 1. 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 2. Degenerative disk disease. 3. Spinal stenosis. 4. Lumbago. 5. Lumbar radiculopathy.  PROCEDURES:  L4 Gill procedure, L4-5 posterior lumbar interbody fusion, insertion of bilateral Tangent cortical bone dowels, posterior nonsegmental instrumentation at L4-5 with CD Horizon titanium pedicle screws and rods, posterolateral arthrodesis, L4-5 with local morcellized autograft bone and VTOS bone substitute.  SURGEON:  Cristi Loron, M.D.  ASSISTANT:  Tanya Nones. Jeral Fruit, M.D.  ANESTHESIA:  General endotracheal.  ESTIMATED BLOOD LOSS:  300 cc.  SPECIMENS:  None.  DRAINS:  None.  COMPLICATIONS:  None.  BRIEF HISTORY:  The patient is a 70 year old black female who has suffered from years of back and leg pain.  It worsened recently.  She failed medical management, and he was worked up with a lumbar MRI which demonstrated a spondylolisthesis at L4-5 with spinal stenosis.  I discussed the various treatment options with her including doing nothing, continued with medical management, and the surgery.  The patient weighed the risks, benefits, and alternatives of surgery, and decided to proceed with a lumbar decompression, stabilization, and fusion.  DESCRIPTION OF PROCEDURE:  The patient was brought to the operating room by the anesthesia team.  General endotracheal anesthesia was induced.  The patient was then  turned to the prone position on the chest rolls.  The lumbosacral region was then prepared with Betadine scrub and Betadine solution.  Sterile drapes were applied.  I then injected the area to be excised with Marcaine with epinephrine solution, and used the scalpel to make a vertical midline incision over the L4-5 interspace.  I used electrocautery to dissected down through the adipose tissue down to the thoracolumbar fascia bilaterally and performed a subperiosteal dissection, stripping the paraspinous musculature, bilateral spinous processes, the lamina of L4 and L5. I inserted a McCullough retractor for exposure and then obtained an intraoperative radiograph to confirm my location.  I used the high speed drill to perform a bilateral L4 laminotomy.  I widened the laminotomy with the Kerrison punch, saving the bone for later use in the fusion process.  I removed the bilateral L4-5 ligamentum flavum, decompressing the thecal sac, and performed a foraminotomy about the bilateral L5 nerve root.  I then removed some more bone up in the cephalad lateral recess, and decompressed the exiting L4 nerve root.  I then freed up the thecal sac and the L5 nerve roots, and then carefully retracted them medially, and incised the bilateral L4-5 intervertebral disk.  I then performed an aggressive diskectomy using the pituitary forceps, Epstein, and Scoville curets.  I now turned my attention to the arthrodesis.  I prepared the vertebral endplates by distracting the interspace with a 10 mm distractor.  I prepared the contralateral interspace by using a 10 mm cutting curet and the chisel.  I then inserted a 10 mm cage and bone dial into  the interspace after carefully protecting the neural structures out of the way.  I then repeated this procedure on the contralateral side, i.e. I removed the vertebral body spreader, prepared the endplates with the cutting curets and the chisel.  I then packed medially with  a combination of local morcellized autograft bone, and VTOS bone substitute medially in the interspace.  I then inserted another 10 mm bone dial into the disk space after retracting neural structures out the way.  I inspected the thecal sac and about L4 and L5 nerve root for any damage and there was none apparent.  Having completing the posterior lumbar interbody fusion, I now turned my attention to the posterior nonsegmental instrumentation.  I used electrocautery to expose the bilateral L4 and L5 transverse processes.  I then used the high speed drill to decorticate the posterior bilateral L4 and L5 pedicles.  I again laid the pedicles with the pedicle probe and then tapped the pedicles and inserted a 6.5 x 45 mm pedicle screws in the bilateral L4 and L5 pedicles under fluoroscopic guidance.  I then felt along the medial aspect of the pedicle with the nerve hooks, and noted that the cord had not been breached by the pedicle screws and the bilateral L4 and L5 nerve roots were undamaged.  I then connected the unilateral pedicles with the appropriate length contoured rod, and then secured the rod in place with the cap, and pedicle with the torque tightener, tightening each cap with a torque tightener, completing the instrumentation.  I now turned my attention to posterolateral arthrodesis.  I used the high speed drill to decorticate the bilateral L4-5 facet joint and the L4 pars region, and the L5 lamina and the bilateral transverse processes.  I laid a combination of local morcellized autograft bone and VTOS bone substitute over these decorticated surfaces, completing the posterolateral arthrodesis.  I achieved strenuous hemostasis using bipolar electrocautery.  I copiously irrigated the wound out with Bacitracin solution having removed the solution, and then I inspected the thecal sac, and the bilateral L4 and L5 nerve roots for any compression or damage, and there was none.  I then  removed the [facility] retractor, and reapproximated the patients thoracolumbar fascia  with interrupted #1 Vicryl, the subcutaneous tissue with interrupted 2-0 Vicryl, and the skin with Steri-Strips and Benzoin.  The wound was then coated with Bacitracin ointment.  A sterile dressing was applied.  The drapes were removed and the patient was subsequently returned to the supine position where she was extubated by the anesthesia team, and transported to the postanesthesia care unit in stable condition.  All sponge, instrument, and needle counts were correct at the end of the case.Dictated by: Cristi Loron, M.D. Attending Physician:  Tressie Stalker D DD:  01/02/01 TD:  01/03/01 Job: [id number]

## 2010-06-09 NOTE — Discharge Summary (Signed)
[name], Dawn Salas NO.:  [id number]   MEDICAL RECORD NO.:  [id number]          PATIENT TYPE:  INP   LOCATION:  3021                         FACILITY:  [facility]   PHYSICIAN:  Cristi Loron, M.D.DATE OF BIRTH:  [birth year]   DATE OF ADMISSION:  08/23/2008  DATE OF DISCHARGE:  08/26/2008                               DISCHARGE SUMMARY   BRIEF HISTORY:  The patient is a 70 year old black female who has had  some headaches.  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  I have recommended she undergo surgery.  The patient has  weighed the risks, benefits, and alternatives of surgery, and decided to  proceed with the operation.   For further details of this admission, please refer to typed history and  physical.   HOSPITAL COURSE:  I admitted the patient to [facility] on  August 23, 2008.  On the day of admission, I performed a left craniotomy  for evacuation of what turned out to be a left meningioma.  The surgery  went well (for full details of this operation, please refer to typed  operative note).   POSTOPERATIVE COURSE:  The patient's postoperative course was  unremarkable, and she was discharged to home on postoperative day #3,  i.e., August 26, 2008.   DISCHARGE INSTRUCTIONS:  The patient was instructed to follow up with me  in 1 week to have her staples removed.  She was given discharge  instructions and all questions were answered.   FINAL DIAGNOSIS:  Left meningioma.   PROCEDURE PERFORMED:  Left craniotomy for gross total resection of  meningioma using microdissection.      Cristi Loron, M.D.  Electronically Signed     JDJ/MEDQ  D:  09/09/2008  T:  09/09/2008  Job:  [id number]

## 2010-06-09 NOTE — Discharge Summary (Signed)
[place]. [facility]  Patient:    Dawn Salas, Dawn Salas Visit Number: [id number] MRN: [id number]          Service Type: SUR Location: 3000 3019 01 Attending Physician:  Cristi Loron Dictated by:   Cristi Loron, M.D. Admit Date:  01/02/2001 Discharge Date: 01/06/2001                             Discharge Summary  For full details of this admission, please refer to the history and physical.  BRIEF HISTORY:  The patient is a 70 year old black female who has suffered from years of back and leg pain.  It worsened recently and she failed medical management and was worked up with a lumbar MRI, which demonstrated spondylolisthesis at L4-5 with spinal stenosis.  I discussed the various treatment options with her, including doing nothing, continuing medical management, and surgery.  The patient weighed the risks, benefits, and alternatives to surgery and decided to proceed with a lumbar decompression and stabilization with fusion.  For further details of this admission, please refer to the history and physical.  HOSPITAL COURSE:  I admitted the patient to [facility] on January 02, 2001.  On the day of admission, I performed an L4-5 posterior lumbar interbody fusion and posterolateral fusion with pedicle screws and rods.  (For full details of this operation, please refer to the typed operative note.)  POSTOPERATIVE COURSE:  The patients postoperative course was essentially unremarkable.  By postoperative day #4, she was eating well, ambulating well, and her wound was healing well without signs of infection.  She had normal motor strength and she requested to be discharged home.  She was, therefore, discharged home on January 06, 2001.  DISCHARGE INSTRUCTIONS:  The patient was given written discharge instructions and instructed to follow up with me in four weeks and to wear a lumbosacral corset at all times while out of bed.  DISCHARGE  MEDICATIONS: 1. Percocet 5, #60, 1-2 p.o. q.4h. p.r.n. pain; limit to eight per day with no    refills. 2. Valium 5 mg, #50, 1 p.o. q.6h. p.r.n. muscle spasms. 3. She was instructed to resume her outpatient medical regimen.  FINAL DIAGNOSES: 1. L4-5 grade 1 acquired spondylolisthesis. 2. Degenerative disk disease. 3. Spinal stenosis. 4. Lumbago lumbar radiculopathy.  PROCEDURES PERFORMED:  L4 Gill procedure; L4-5 posterior lumbar interbody fusion; insertion of bilateral tangient cortical bone dowels; posterior nonsegmental instrumentation L4-5 with C-D rods and titanium screws and rods; posterolateral arthrodesis L4-5 with local morcellized autograft bone and VTOS bone substitute. Dictated by:   Cristi Loron, M.D. Attending Physician:  Tressie Stalker D DD:  02/13/01 TD:  02/14/01 Job: [id number]

## 2010-06-13 ENCOUNTER — Ambulatory Visit (HOSPITAL_COMMUNITY)
Admission: RE | Admit: 2010-06-13 | Discharge: 2010-06-13 | Disposition: A | Discharge status: Home / Self Care | Payer: Medicare Other | Source: Ambulatory Visit | Attending: Orthopedic Surgery | Admitting: Orthopedic Surgery

## 2010-06-13 DIAGNOSIS — I10 Essential (primary) hypertension: Secondary | ICD-10-CM | POA: Insufficient documentation

## 2010-06-13 DIAGNOSIS — Z01812 Encounter for preprocedural laboratory examination: Secondary | ICD-10-CM | POA: Insufficient documentation

## 2010-06-13 DIAGNOSIS — K219 Gastro-esophageal reflux disease without esophagitis: Secondary | ICD-10-CM | POA: Insufficient documentation

## 2010-06-13 DIAGNOSIS — G4733 Obstructive sleep apnea (adult) (pediatric): Secondary | ICD-10-CM | POA: Insufficient documentation

## 2010-06-13 DIAGNOSIS — E119 Type 2 diabetes mellitus without complications: Secondary | ICD-10-CM | POA: Insufficient documentation

## 2010-06-13 DIAGNOSIS — Z01818 Encounter for other preprocedural examination: Secondary | ICD-10-CM | POA: Insufficient documentation

## 2010-06-13 DIAGNOSIS — M654 Radial styloid tenosynovitis [de Quervain]: Secondary | ICD-10-CM | POA: Insufficient documentation

## 2010-06-13 DIAGNOSIS — F329 Major depressive disorder, single episode, unspecified: Secondary | ICD-10-CM | POA: Insufficient documentation

## 2010-06-13 DIAGNOSIS — F3289 Other specified depressive episodes: Secondary | ICD-10-CM | POA: Insufficient documentation

## 2010-06-13 LAB — URINALYSIS, ROUTINE W REFLEX MICROSCOPIC
Bilirubin Urine: NEGATIVE
Glucose, UA: NEGATIVE mg/dL
Hgb urine dipstick: NEGATIVE
Ketones, ur: NEGATIVE mg/dL
Nitrite: NEGATIVE
Protein, ur: NEGATIVE mg/dL
Specific Gravity, Urine: 1.013 (ref 1.005–1.030)
Urobilinogen, UA: 0.2 mg/dL (ref 0.0–1.0)
pH: 5.5 (ref 5.0–8.0)

## 2010-06-13 LAB — GLUCOSE, CAPILLARY
Glucose-Capillary: 117 mg/dL — ABNORMAL HIGH (ref 70–99)
Glucose-Capillary: 94 mg/dL (ref 70–99)

## 2010-07-05 NOTE — Op Note (Signed)
  NAMEKAMARIYAH, TIMBERLAKE NO.:  [id number]  MEDICAL RECORD NO.:  [id number]           PATIENT TYPE:  O  LOCATION:  SDSC                         FACILITY:  [facility]  PHYSICIAN:  Burnard Bunting, M.D.    DATE OF BIRTH:  1940-09-14  DATE OF PROCEDURE:  06/13/2010 DATE OF DISCHARGE:  06/13/2010                              OPERATIVE REPORT   PREOPERATIVE DIAGNOSIS:  Lollie Sails tenosynovitis.  POSTOPERATIVE DIAGNOSIS:  Lollie Sails tenosynovitis.  PROCEDURE:  Right release, first dorsal compartment.  SURGEON:  Burnard Bunting, MD  ASSISTANT:  None.  ANESTHESIA:  General endotracheal.  ESTIMATED BLOOD LOSS:  Minimal.  INDICATIONS:  Brookelynn Hamor is a 70 year old patient with right dorsal right de Quervain tenosynovitis presents now for operative management for failure of nonoperative management after explanation of risks and benefits.  PROCEDURE IN DETAIL:  The patient was brought to the operating room where general endotracheal anesthesia was induced.  Preoperative antibiotics were administered.  Time-out was called.  Right hand was prescribed with alcohol and Betadine which allowed to air dry, prepped with DuraPrep solution in a sterile manner.  The right arm was elevated and exsanguinated with Esmarch wrap and tourniquet was inflated. Incision was made about 5-6 mm from the tip of the radial styloid.  Skin and subcutaneous tissues were sharply divided.  The proximal and distal margins of the first dorsal compartment were identified.  Then, a right angle retractor was placed below, and the compartment was then divided primarily slightly on its dorsal aspect.  All tendons within the first dorsal compartment were removed and found to have no accessory clips. Partial tenosynovectomy was performed.  Tourniquet was released after 28 minutes.  Thorough irrigation was performed.  Skin edges were anesthetized using Marcaine.  The bulky dressing volar wrist splint  was applied.  The patient tolerated the procedure well without immediate complication.  Care was taken to avoid all branches in the radial nerve within the field.  These were retracted bluntly.    Burnard Bunting, M.D.    GSD/MEDQ  D:  06/13/2010  T:  06/14/2010  Job:  [id number]  Electronically Signed by Reece Agar.  Kacie Huxtable M.D. on 07/05/2010 02:09:35 PM

## 2010-07-07 ENCOUNTER — Other Ambulatory Visit: Payer: Self-pay | Admitting: *Deleted

## 2010-07-07 MED ORDER — POTASSIUM CHLORIDE ER 10 MEQ PO TBCR: 10.0000 meq | EXTENDED_RELEASE_TABLET | Freq: Every day | ORAL | Status: DC

## 2010-08-09 ENCOUNTER — Telehealth: Payer: Self-pay | Admitting: Family Medicine

## 2010-08-09 MED ORDER — VENLAFAXINE HCL ER 75 MG PO CP24: 75.0000 mg | ORAL_CAPSULE | Freq: Every day | ORAL | Status: DC

## 2010-08-09 NOTE — Telephone Encounter (Signed)
Patient is going out of town Thursday Morning and is out of venlafaxine (EFFEXOR-XR) 75 MG 24 hr And is concerned because she gets "side affects" when she doesn't take it. [facility] [place] [facility] [place]

## 2010-08-28 ENCOUNTER — Encounter: Payer: Self-pay | Admitting: Family Medicine

## 2010-08-29 ENCOUNTER — Ambulatory Visit (INDEPENDENT_AMBULATORY_CARE_PROVIDER_SITE_OTHER): Payer: Medicare Other | Admitting: Family Medicine

## 2010-08-29 ENCOUNTER — Encounter: Payer: Self-pay | Admitting: Family Medicine

## 2010-08-29 DIAGNOSIS — I10 Essential (primary) hypertension: Secondary | ICD-10-CM

## 2010-08-29 DIAGNOSIS — E119 Type 2 diabetes mellitus without complications: Secondary | ICD-10-CM

## 2010-08-29 DIAGNOSIS — K219 Gastro-esophageal reflux disease without esophagitis: Secondary | ICD-10-CM

## 2010-08-29 DIAGNOSIS — L723 Sebaceous cyst: Secondary | ICD-10-CM

## 2010-08-29 DIAGNOSIS — E785 Hyperlipidemia, unspecified: Secondary | ICD-10-CM

## 2010-08-29 DIAGNOSIS — L720 Epidermal cyst: Secondary | ICD-10-CM

## 2010-08-29 LAB — BASIC METABOLIC PANEL
BUN: 18 mg/dL (ref 6–23)
CO2: 29 mEq/L (ref 19–32)
Calcium: 9.4 mg/dL (ref 8.4–10.5)
Chloride: 102 mEq/L (ref 96–112)
Creatinine, Ser: 1.4 mg/dL — ABNORMAL HIGH (ref 0.4–1.2)
GFR: 47.73 mL/min — ABNORMAL LOW (ref >60.00)
Glucose, Bld: 117 mg/dL — ABNORMAL HIGH (ref 70–99)
Potassium: 3.8 mEq/L (ref 3.5–5.1)
Sodium: 142 mEq/L (ref 135–145)

## 2010-08-29 LAB — HEPATIC FUNCTION PANEL
ALT: 14 U/L (ref 0–35)
AST: 21 U/L (ref 0–37)
Albumin: 4.4 g/dL (ref 3.5–5.2)
Alkaline Phosphatase: 93 U/L (ref 39–117)
Bilirubin, Direct: 0 mg/dL (ref 0.0–0.3)
Total Bilirubin: 1 mg/dL (ref 0.3–1.2)
Total Protein: 7.5 g/dL (ref 6.0–8.3)

## 2010-08-29 LAB — LIPID PANEL
Cholesterol: 359 mg/dL — ABNORMAL HIGH (ref 0–200)
HDL: 86.6 mg/dL (ref >39.00)
Total CHOL/HDL Ratio: 4
Triglycerides: 123 mg/dL (ref 0.0–149.0)
VLDL: 24.6 mg/dL (ref 0.0–40.0)

## 2010-08-29 LAB — LDL CHOLESTEROL, DIRECT: Direct LDL: 227.5 mg/dL

## 2010-08-29 LAB — HEMOGLOBIN A1C: Hgb A1c MFr Bld: 6.8 % — ABNORMAL HIGH (ref 4.6–6.5)

## 2010-08-29 LAB — GLUCOSE, POCT (MANUAL RESULT ENTRY): POC Glucose: 123

## 2010-08-29 MED ORDER — VENLAFAXINE HCL ER 75 MG PO CP24: 75.0000 mg | ORAL_CAPSULE | Freq: Every day | ORAL | Status: DC

## 2010-08-29 MED ORDER — LOSARTAN POTASSIUM-HCTZ 100-12.5 MG PO TABS: 1.0000 | ORAL_TABLET | Freq: Every day | ORAL | Status: DC

## 2010-08-29 MED ORDER — RABEPRAZOLE SODIUM 20 MG PO TBEC: 20.0000 mg | DELAYED_RELEASE_TABLET | Freq: Every day | ORAL | Status: DC

## 2010-08-29 MED ORDER — SOLIFENACIN SUCCINATE 5 MG PO TABS: 5.0000 mg | ORAL_TABLET | Freq: Every day | ORAL | Status: DC

## 2010-08-29 NOTE — Progress Notes (Signed)
  Subjective:    Patient ID: Dawn Salas, female    DOB: 02-27-1940, 70 y.o.   MRN: [id number]  HPI Medical followup. History of type 2 diabetes with recent initiation of metformin, hyperlipidemia, history of depression, obstructive sleep apnea, hypertension, and history of GERD. She is followed by cardiologist and had been WelChol but ran out of medication and never refilled. She has had intolerance to statins in the past. No recent lipids.  Blood sugars very well controlled with fastings mostly low 100s. No symptoms of hyperglycemia. Exercises 3 days per week.  Hypertension meds reviewed. Compliant with all. No side effects.  Pruritic skin lesion back region. No drainage. Present for several months if not years   Review of Systems  Constitutional: Negative for fever and chills.  Respiratory: Negative for cough and shortness of breath.   Cardiovascular: Negative for chest pain, palpitations and leg swelling.  Gastrointestinal: Negative for abdominal pain.  Genitourinary: Negative for dysuria.  Neurological: Negative for dizziness, syncope and headaches.  Hematological: Negative for adenopathy.       Objective:   Physical Exam  Constitutional: She is oriented to person, place, and time. She appears well-developed and well-nourished.  HENT:  Mouth/Throat: Oropharynx is clear and moist.  Neck: Neck supple. No thyromegaly present.  Cardiovascular: Normal rate and regular rhythm.  Exam reveals no gallop.   Pulmonary/Chest: Effort normal and breath sounds normal. No respiratory distress. She has no wheezes. She has no rales.  Musculoskeletal: She exhibits no edema.       Feet reveal no skin lesions. Good distal foot pulses. Good capillary refill. No calluses. Normal sensation with monofilament testing   Neurological: She is alert and oriented to person, place, and time.  Skin:       Upper back right thoracic region reveals epidermal cyst with typical central umbilication. No  drainage. No erythema or tenderness          Assessment & Plan:  #1 type 2 diabetes. Excellent control by home readings. Check A1c #2 dyslipidemia. Check lipid and hepatic panel #3 hypertension well controlled. Check basic metabolic panel #4 epidermal cyst. Schedule for excision as she's had significant pruritus and requests this be excised

## 2010-08-30 ENCOUNTER — Telehealth: Payer: Self-pay

## 2010-08-30 NOTE — Telephone Encounter (Signed)
Detailed message left on personally identified voicemail. Labs mailed

## 2010-08-30 NOTE — Telephone Encounter (Signed)
Message copied by Beverely Low on Wed Aug 30, 2010  9:50 AM ------      Message from: Kristian Covey      Created: Tue Aug 29, 2010 11:05 PM       Diabetes is improved.  Her cholesterol is very high.  She has seen cardiologist for this and need to make sure pt has copies of labs to take to them.

## 2010-09-19 ENCOUNTER — Telehealth: Payer: Self-pay | Admitting: *Deleted

## 2010-09-19 NOTE — Telephone Encounter (Signed)
The patient's drug plan has a preferred drug list, the aciphex prescribed requires prior authorization.  There are alternatives that do not require prior authorization.  Nexium, omeprozole and pantoprazol do not require prior auth.  Please advise if one of the preferred alternatives would be appropriate for this patient.

## 2010-09-19 NOTE — Telephone Encounter (Signed)
It would appropriate to try Pantoprazole 40 mg once daily.  It seems pt might have tried other alternatives previously without good success and we need to confirm.   If she is agreeable, may try med above.

## 2010-09-20 ENCOUNTER — Telehealth: Payer: Self-pay | Admitting: *Deleted

## 2010-09-20 MED ORDER — PANTOPRAZOLE SODIUM 40 MG PO TBEC: 40.0000 mg | DELAYED_RELEASE_TABLET | Freq: Every day | ORAL | Status: DC

## 2010-09-20 NOTE — Telephone Encounter (Signed)
Protonix 40 mg.  q day called to [facility].

## 2010-09-20 NOTE — Telephone Encounter (Signed)
Debbie, please notify insurance of Dr. Lucie Leather instructions.

## 2010-10-11 ENCOUNTER — Other Ambulatory Visit: Payer: Self-pay | Admitting: *Deleted

## 2010-10-11 MED ORDER — POTASSIUM CHLORIDE ER 10 MEQ PO TBCR: 10.0000 meq | EXTENDED_RELEASE_TABLET | Freq: Every day | ORAL | Status: DC

## 2010-10-17 ENCOUNTER — Other Ambulatory Visit: Payer: Self-pay | Admitting: Family Medicine

## 2010-10-17 DIAGNOSIS — Z1231 Encounter for screening mammogram for malignant neoplasm of breast: Secondary | ICD-10-CM

## 2010-10-30 ENCOUNTER — Encounter: Payer: Self-pay | Admitting: Family Medicine

## 2010-10-30 ENCOUNTER — Ambulatory Visit (INDEPENDENT_AMBULATORY_CARE_PROVIDER_SITE_OTHER): Payer: Medicare Other | Admitting: Family Medicine

## 2010-10-30 VITALS — BP 130/82 | Temp 97.9°F | Wt 223.0 lb

## 2010-10-30 DIAGNOSIS — E785 Hyperlipidemia, unspecified: Secondary | ICD-10-CM

## 2010-10-30 DIAGNOSIS — L723 Sebaceous cyst: Secondary | ICD-10-CM

## 2010-10-30 DIAGNOSIS — Z23 Encounter for immunization: Secondary | ICD-10-CM

## 2010-10-30 DIAGNOSIS — E119 Type 2 diabetes mellitus without complications: Secondary | ICD-10-CM

## 2010-10-30 DIAGNOSIS — L720 Epidermal cyst: Secondary | ICD-10-CM

## 2010-10-30 DIAGNOSIS — R300 Dysuria: Secondary | ICD-10-CM

## 2010-10-30 DIAGNOSIS — K219 Gastro-esophageal reflux disease without esophagitis: Secondary | ICD-10-CM

## 2010-10-30 DIAGNOSIS — I10 Essential (primary) hypertension: Secondary | ICD-10-CM

## 2010-10-30 LAB — POCT URINALYSIS DIPSTICK
Bilirubin, UA: NEGATIVE
Blood, UA: NEGATIVE
Glucose, UA: NEGATIVE
Ketones, UA: NEGATIVE
Leukocytes, UA: NEGATIVE
Nitrite, UA: NEGATIVE
Spec Grav, UA: 1.01
Urobilinogen, UA: 0.2
pH, UA: 6

## 2010-10-30 MED ORDER — RABEPRAZOLE SODIUM 20 MG PO TBEC: 20.0000 mg | DELAYED_RELEASE_TABLET | Freq: Every day | ORAL | Status: DC

## 2010-10-30 NOTE — Progress Notes (Signed)
  Subjective:    Patient ID: Dawn Salas, female    DOB: [birth year], 70 y.o.   MRN: [id number]  HPI Here for multiple issues-medical follow up and separate problem of symptomatic cyst R upper back which she wishes to have excised.  Onset Friday of frequency and some burning with urination. Left over nitrofurantoin which she started over the weekend. Symptoms slightly better today. No fever or chills.  Long history of reflux. Controlled with AcipHex. Insurance required change to Protonix recently and this has not worked well. She has frequent nausea and frequent reflux symptoms. Has tried Prilosec, Nexium, Prevacid, and Zantac without relief previously.  Type 2 diabetes. Recent A1c 6.8%. CBGs reveal excellent control. No symptoms of hyper glycemia.  Dyslipidemia. Intolerant to multiple statins. Has tried Lipitor, Mevacor, Crestor, and simvastatin but had headaches with all. Recent cholesterol 359 with LDL 227. Started WelChol which she is tolerating well without side effect thus far.  No constipation and no hx hypertriglyceridemia.  Pruritic cyst mid back. Requesting excision. No hx of infection.  Progressive pruritis and irritation over time.  Review of Systems  Constitutional: Negative for fever, chills and appetite change.  HENT: Negative for trouble swallowing and voice change.   Respiratory: Negative for cough and shortness of breath.   Cardiovascular: Negative for chest pain.  Gastrointestinal: Negative for vomiting, diarrhea and constipation.  Genitourinary: Positive for dysuria and frequency. Negative for hematuria.  Neurological: Negative for dizziness.       Objective:   Physical Exam  Constitutional: She appears well-developed and well-nourished.  HENT:  Mouth/Throat: Oropharynx is clear and moist.  Neck: Neck supple.  Cardiovascular: Normal rate and regular rhythm.   Pulmonary/Chest: Effort normal and breath sounds normal. No respiratory distress. She has no wheezes.  She has no rales.  Musculoskeletal: She exhibits no edema.  Lymphadenopathy:    She has no cervical adenopathy.  Skin:       Patient has epidermal cyst right upper back. She has darkened dimpled area consistent with this type of cyst. No induration. No erythema or warmth. No fluctuance          Assessment & Plan:  #1 dysuria. Urine dipstick today unremarkable. No indication for further antibiotics at this time #2 long history of GERD. Symptoms not controlled on multiple proton pump inhibitors. Change back to AcipHex 20 mg daily. Will require prior authorization with insurance. #3 type 2 diabetes. Fair control. Continue to work on weight loss. Recheck A1c within 6 months #4 dyslipidemia. Intolerant to statins. Tolerating WelChol. Future order for lipid panel obtained #5 epidermal cyst right upper back. Discussed risk and benefits of excision including risk of infection, bleeding, and scarring. Patient consented. Skin prepped with Betadine. Anesthesia 1% Xylocaine with epinephrine. Elliptical excision with #15 blade. Wound irrigated with normal saline. Closed 4 sutures 5-0 Ethilon. Minimal bleeding. Antibiotic and dressing applied. Wound care structure given. Sutures out in 8-10 days

## 2010-10-30 NOTE — Patient Instructions (Signed)
Keep wound dry for 24 hours then clean daily with soap and water. Apply topical antibiotic daily for 3-4 days Follow up promptly for any signs of infection such as redness, swelling, or pus like drainage. Sutures need to be removed in 8-10 days.

## 2010-11-02 ENCOUNTER — Other Ambulatory Visit (INDEPENDENT_AMBULATORY_CARE_PROVIDER_SITE_OTHER): Payer: Medicare Other

## 2010-11-02 ENCOUNTER — Other Ambulatory Visit: Payer: Self-pay | Admitting: Family Medicine

## 2010-11-02 DIAGNOSIS — E785 Hyperlipidemia, unspecified: Secondary | ICD-10-CM

## 2010-11-02 LAB — LIPID PANEL
Cholesterol: 281 mg/dL — ABNORMAL HIGH (ref 0–200)
HDL: 92.4 mg/dL (ref >39.00)
Total CHOL/HDL Ratio: 3
Triglycerides: 116 mg/dL (ref 0.0–149.0)
VLDL: 23.2 mg/dL (ref 0.0–40.0)

## 2010-11-02 LAB — LDL CHOLESTEROL, DIRECT: Direct LDL: 169.2 mg/dL

## 2010-11-03 NOTE — Progress Notes (Signed)
Quick Note:  Pt informed ______ 

## 2010-11-21 ENCOUNTER — Ambulatory Visit (HOSPITAL_COMMUNITY)
Admission: RE | Admit: 2010-11-21 | Discharge: 2010-11-21 | Disposition: A | Discharge status: Home / Self Care | Payer: Medicare Other | Source: Ambulatory Visit | Attending: Family Medicine | Admitting: Family Medicine

## 2010-11-21 DIAGNOSIS — Z1231 Encounter for screening mammogram for malignant neoplasm of breast: Secondary | ICD-10-CM | POA: Insufficient documentation

## 2010-11-24 ENCOUNTER — Telehealth: Payer: Self-pay | Admitting: Family Medicine

## 2010-11-24 DIAGNOSIS — K219 Gastro-esophageal reflux disease without esophagitis: Secondary | ICD-10-CM

## 2010-11-24 NOTE — Telephone Encounter (Signed)
Pt called and said that the pantoprazole (PROTONIX) 40 MG tablet does not work. Pt req to go back on to RABEprazole (ACIPHEX) 20 MG tablet. Pt said that Dr Caryl Never would need to [facility] [phone] for prior auth to get med approved.

## 2010-11-27 MED ORDER — RABEPRAZOLE SODIUM 20 MG PO TBEC: 20.0000 mg | DELAYED_RELEASE_TABLET | Freq: Every day | ORAL | Status: DC

## 2010-11-27 NOTE — Telephone Encounter (Signed)
I've called [facility] & prior Berkley Harvey is approved. I also called the pt and let her know.

## 2010-11-27 NOTE — Telephone Encounter (Signed)
Please see Dawn Salas's documentation below regarding the patient's request.  I will need a new Rx generated & sent to the pharmacy for the drug the patient is requesting before I can initiate any type of prior auth. Thanks!

## 2010-11-27 NOTE — Telephone Encounter (Signed)
Will renew.  Rx sent.

## 2010-11-30 ENCOUNTER — Other Ambulatory Visit: Payer: Self-pay | Admitting: *Deleted

## 2010-11-30 MED ORDER — METOPROLOL SUCCINATE ER 100 MG PO TB24: 100.0000 mg | ORAL_TABLET | Freq: Every day | ORAL | Status: DC

## 2010-12-11 ENCOUNTER — Other Ambulatory Visit: Payer: Self-pay | Admitting: Family Medicine

## 2010-12-11 DIAGNOSIS — K219 Gastro-esophageal reflux disease without esophagitis: Secondary | ICD-10-CM

## 2010-12-11 MED ORDER — VENLAFAXINE HCL ER 75 MG PO CP24: 75.0000 mg | ORAL_CAPSULE | Freq: Every day | ORAL | Status: DC

## 2010-12-11 NOTE — Telephone Encounter (Signed)
Pt called and is req a refill of venlafaxine (EFFEXOR-XR) 75 MG 24 hr capsule to [facility] in [place], [place]. Pt only has 1 pill left. Pls call in today. Pt is aware pcp is out of office.

## 2011-02-09 ENCOUNTER — Other Ambulatory Visit: Payer: Self-pay | Admitting: *Deleted

## 2011-02-09 MED ORDER — METFORMIN HCL 500 MG PO TABS: 500.0000 mg | ORAL_TABLET | Freq: Two times a day (BID) | ORAL | Status: DC

## 2011-02-15 ENCOUNTER — Encounter: Payer: Self-pay | Admitting: Family Medicine

## 2011-02-15 ENCOUNTER — Ambulatory Visit (INDEPENDENT_AMBULATORY_CARE_PROVIDER_SITE_OTHER): Payer: Medicare Other | Admitting: Family Medicine

## 2011-02-15 DIAGNOSIS — E785 Hyperlipidemia, unspecified: Secondary | ICD-10-CM

## 2011-02-15 DIAGNOSIS — I10 Essential (primary) hypertension: Secondary | ICD-10-CM

## 2011-02-15 DIAGNOSIS — E119 Type 2 diabetes mellitus without complications: Secondary | ICD-10-CM

## 2011-02-15 DIAGNOSIS — K219 Gastro-esophageal reflux disease without esophagitis: Secondary | ICD-10-CM

## 2011-02-15 DIAGNOSIS — F329 Major depressive disorder, single episode, unspecified: Secondary | ICD-10-CM

## 2011-02-15 LAB — HEMOGLOBIN A1C: Hgb A1c MFr Bld: 6.9 % — ABNORMAL HIGH (ref 4.6–6.5)

## 2011-02-15 MED ORDER — METFORMIN HCL 500 MG PO TABS: 500.0000 mg | ORAL_TABLET | Freq: Two times a day (BID) | ORAL | Status: DC

## 2011-02-15 MED ORDER — RABEPRAZOLE SODIUM 20 MG PO TBEC: 20.0000 mg | DELAYED_RELEASE_TABLET | Freq: Every day | ORAL | Status: DC

## 2011-02-15 MED ORDER — METOPROLOL SUCCINATE ER 100 MG PO TB24: 100.0000 mg | ORAL_TABLET | Freq: Every day | ORAL | Status: DC

## 2011-02-15 MED ORDER — VENLAFAXINE HCL ER 37.5 MG PO CP24: 37.5000 mg | ORAL_CAPSULE | Freq: Every day | ORAL | Status: DC

## 2011-02-15 MED ORDER — LOSARTAN POTASSIUM-HCTZ 100-12.5 MG PO TABS: 1.0000 | ORAL_TABLET | Freq: Every day | ORAL | Status: DC

## 2011-02-15 MED ORDER — SOLIFENACIN SUCCINATE 5 MG PO TABS: 5.0000 mg | ORAL_TABLET | Freq: Every day | ORAL | Status: DC

## 2011-02-15 NOTE — Progress Notes (Signed)
  Subjective:    Patient ID: Dawn Salas, female    DOB: 05-26-1940, 71 y.o.   MRN: [id number]  HPI  Medical followup. Patient's history type 2 diabetes, hyperlipidemia, obesity, obstructive sleep apnea, hypertension, asthma, GERD. Here for followup. Several issues to address as follows  Needs refills of several medications. Blood pressures been well-controlled with no orthostasis. Needs refills of losartan HCTZ and metoprolol. She has a long history of depression but stable for several months. She prefers to taper off the Effexor. She's had night sweats almost medication.  Type 2 diabetes. Blood sugars well controlled by home readings. Recent A1c 6.8%.  Dyslipidemia. No history of CAD. Intolerant to multiple statins. Tolerating WelChol without difficulty. Recent lipids had improved somewhat. She has a good HDL.  Review of Systems  Constitutional: Negative for fatigue.  Eyes: Negative for visual disturbance.  Respiratory: Negative for cough, chest tightness, shortness of breath and wheezing.   Cardiovascular: Negative for chest pain, palpitations and leg swelling.  Neurological: Negative for dizziness, seizures, syncope, weakness, light-headedness and headaches.       Objective:   Physical Exam  Constitutional: She is oriented to person, place, and time. She appears well-developed and well-nourished.  HENT:  Right Ear: External ear normal.  Left Ear: External ear normal.  Mouth/Throat: Oropharynx is clear and moist.  Neck: Neck supple. No thyromegaly present.  Cardiovascular: Normal rate and regular rhythm.   Pulmonary/Chest: Effort normal and breath sounds normal. No respiratory distress. She has no wheezes. She has no rales.  Musculoskeletal: She exhibits no edema.  Neurological: She is alert and oriented to person, place, and time.  Psychiatric: She has a normal mood and affect. Her behavior is normal.          Assessment & Plan:  #1 type 2 diabetes. Reassess A1c.  Continue weight loss efforts. If A1c improving on WelChol consider reducing metformin to once daily #2 dyslipidemia. Intolerant of statins. Recheck lipids at followup  #3 history of depression stable. Taper off Effexor with instructions given. #4hypertension stable. Refilled medications for one year #5 GERD stable refill AcipHex for one year

## 2011-02-15 NOTE — Patient Instructions (Signed)
Decrease Effexor to 37.5 mg daily for 2 weeks and then go to every other day for 2 weeks then stop.

## 2011-02-16 NOTE — Progress Notes (Signed)
Quick Note:  Pt informed ______ 

## 2011-02-20 ENCOUNTER — Ambulatory Visit (INDEPENDENT_AMBULATORY_CARE_PROVIDER_SITE_OTHER): Payer: Medicare Other | Admitting: Family

## 2011-02-20 ENCOUNTER — Encounter: Payer: Self-pay | Admitting: Family

## 2011-02-20 VITALS — BP 140/90 | HR 76 | Temp 98.4°F | Resp 16 | Ht 67.0 in | Wt 227.0 lb

## 2011-02-20 DIAGNOSIS — I10 Essential (primary) hypertension: Secondary | ICD-10-CM

## 2011-02-20 DIAGNOSIS — H9201 Otalgia, right ear: Secondary | ICD-10-CM

## 2011-02-20 DIAGNOSIS — H9209 Otalgia, unspecified ear: Secondary | ICD-10-CM

## 2011-02-20 DIAGNOSIS — J069 Acute upper respiratory infection, unspecified: Secondary | ICD-10-CM

## 2011-02-20 MED ORDER — AZITHROMYCIN 250 MG PO TABS: 250.0000 mg | ORAL_TABLET | Freq: Every day | ORAL | Status: DC

## 2011-02-20 NOTE — Patient Instructions (Signed)
Barotitis Media Barotitis media is soreness (inflammation) of the area behind the eardrum (middle ear). This occurs when the auditory tube (Eustachian tube) leading from the back of the throat to the eardrum is blocked. When it is blocked air cannot move in and out of the middle ear to equalize pressure changes. These pressure changes come from changes in altitude when:  Flying.   Driving in the mountains.   Diving.  Problems are more likely to occur with pressure changes during times when you are congested as from:  Hay fever.   Upper respiratory infection.   A cold.  Damage or hearing loss (barotrauma) caused by this may be permanent. HOME CARE INSTRUCTIONS   Use medicines as recommended by your caregiver. Over the counter medicines will help unblock the canal and can help during times of air travel.   Do not put anything into your ears to clean or unplug them. Eardrops will not be helpful.   Do not swim, dive, or fly until your caregiver says it is all right to do so. If these activities are necessary, chewing gum with frequent swallowing may help. It is also helpful to hold your nose and gently blow to pop your ears for equalizing pressure changes. This forces air into the Eustachian tube.   For little ones with problems, give your baby a bottle of water or juice during periods when pressure changes would be anticipated such as during take offs and landings associated with air travel.   Only take over-the-counter or prescription medicines for pain, discomfort, or fever as directed by your caregiver.   A decongestant may be helpful in de-congesting the middle ear and make pressure equalization easier. This can be even more effective if the drops (spray) are delivered with the head lying over the edge of a bed with the head tilted toward the ear on the affected side.   If your caregiver has given you a follow-up appointment, it is very important to keep that appointment. Not keeping  the appointment could result in a chronic or permanent injury, pain, hearing loss and disability. If there is any problem keeping the appointment, you must call back to this facility for assistance.  SEEK IMMEDIATE MEDICAL CARE IF:   You develop a severe headache, dizziness, severe ear pain, or bloody or pus-like drainage from your ears.   An oral temperature above 102 F (38.9 C) develops.   Your problems do not improve or become worse.  MAKE SURE YOU:   Understand these instructions.   Will watch your condition.   Will get help right away if you are not doing well or get worse.  Document Released: 01/06/2000 Document Revised: 09/20/2010 Document Reviewed: 08/14/2007 [facility] Patient Information 2012 [place], [place].  Upper Respiratory Infection, Adult An upper respiratory infection (URI) is also sometimes known as the common cold. The upper respiratory tract includes the nose, sinuses, throat, trachea, and bronchi. Bronchi are the airways leading to the lungs. Most people improve within 1 week, but symptoms can last up to 2 weeks. A residual cough may last even longer.  CAUSES Many different viruses can infect the tissues lining the upper respiratory tract. The tissues become irritated and inflamed and often become very moist. Mucus production is also common. A cold is contagious. You can easily spread the virus to others by oral contact. This includes kissing, sharing a glass, coughing, or sneezing. Touching your mouth or nose and then touching a surface, which is then touched by another person,  can also spread the virus. SYMPTOMS  Symptoms typically develop 1 to 3 days after you come in contact with a cold virus. Symptoms vary from person to person. They may include:  Runny nose.   Sneezing.   Nasal congestion.   Sinus irritation.   Sore throat.   Loss of voice (laryngitis).   Cough.   Fatigue.   Muscle aches.   Loss of appetite.   Headache.   Low-grade fever.    DIAGNOSIS  You might diagnose your own cold based on familiar symptoms, since most people get a cold 2 to 3 times a year. Your caregiver can confirm this based on your exam. Most importantly, your caregiver can check that your symptoms are not due to another disease such as strep throat, sinusitis, pneumonia, asthma, or epiglottitis. Blood tests, throat tests, and X-rays are not necessary to diagnose a common cold, but they may sometimes be helpful in excluding other more serious diseases. Your caregiver will decide if any further tests are required. RISKS AND COMPLICATIONS  You may be at risk for a more severe case of the common cold if you smoke cigarettes, have chronic heart disease (such as heart failure) or lung disease (such as asthma), or if you have a weakened immune system. The very young and very old are also at risk for more serious infections. Bacterial sinusitis, middle ear infections, and bacterial pneumonia can complicate the common cold. The common cold can worsen asthma and chronic obstructive pulmonary disease (COPD). Sometimes, these complications can require emergency medical care and may be life-threatening. PREVENTION  The best way to protect against getting a cold is to practice good hygiene. Avoid oral or hand contact with people with cold symptoms. Wash your hands often if contact occurs. There is no clear evidence that vitamin C, vitamin E, echinacea, or exercise reduces the chance of developing a cold. However, it is always recommended to get plenty of rest and practice good nutrition. TREATMENT  Treatment is directed at relieving symptoms. There is no cure. Antibiotics are not effective, because the infection is caused by a virus, not by bacteria. Treatment may include:  Increased fluid intake. Sports drinks offer valuable electrolytes, sugars, and fluids.   Breathing heated mist or steam (vaporizer or shower).   Eating chicken soup or other clear broths, and maintaining  good nutrition.   Getting plenty of rest.   Using gargles or lozenges for comfort.   Controlling fevers with ibuprofen or acetaminophen as directed by your caregiver.   Increasing usage of your inhaler if you have asthma.  Zinc gel and zinc lozenges, taken in the first 24 hours of the common cold, can shorten the duration and lessen the severity of symptoms. Pain medicines may help with fever, muscle aches, and throat pain. A variety of non-prescription medicines are available to treat congestion and runny nose. Your caregiver can make recommendations and may suggest nasal or lung inhalers for other symptoms.  HOME CARE INSTRUCTIONS   Only take over-the-counter or prescription medicines for pain, discomfort, or fever as directed by your caregiver.   Use a warm mist humidifier or inhale steam from a shower to increase air moisture. This may keep secretions moist and make it easier to breathe.   Drink enough water and fluids to keep your urine clear or pale yellow.   Rest as needed.   Return to work when your temperature has returned to normal or as your caregiver advises. You may need to stay home longer to  avoid infecting others. You can also use a face mask and careful hand washing to prevent spread of the virus.  SEEK MEDICAL CARE IF:   After the first few days, you feel you are getting worse rather than better.   You need your caregiver's advice about medicines to control symptoms.   You develop chills, worsening shortness of breath, or brown or red sputum. These may be signs of pneumonia.   You develop yellow or brown nasal discharge or pain in the face, especially when you bend forward. These may be signs of sinusitis.   You develop a fever, swollen neck glands, pain with swallowing, or white areas in the back of your throat. These may be signs of strep throat.  SEEK IMMEDIATE MEDICAL CARE IF:   You have a fever.   You develop severe or persistent headache, ear pain, sinus  pain, or chest pain.   You develop wheezing, a prolonged cough, cough up blood, or have a change in your usual mucus (if you have chronic lung disease).   You develop sore muscles or a stiff neck.  Document Released: 07/04/2000 Document Revised: 09/20/2010 Document Reviewed: 05/12/2010 [facility] Patient Information 2012 [place], [place].

## 2011-02-20 NOTE — Progress Notes (Signed)
Subjective:    Patient ID: Dawn Salas, female    DOB: [birth year], 71 y.o.   MRN: [id number]  HPI 71 year old African American female, nonsmoker, patient of Dr. Caryl Never is in today with complaints of a right ear ache that's been going on for one week. She describes the pain in her right ear has a throbbing sensation and a feeling of fullness. She's also been battling an upper respiratory infection for 2 weeks, she was seen in an urgent care clinic and was prescribed Cheratussin. However her symptoms are no better. She continues to have cough and chest congestion. She denies any fever, lightheadedness, dizziness, chest pain, palpitations or edema.   Review of Systems  HENT: Positive for ear pain, congestion and postnasal drip.        Right ear pain  Eyes: Negative.   Respiratory: Positive for cough.   Cardiovascular: Negative.   Musculoskeletal: Negative.   Skin: Negative.   Neurological: Negative.   Hematological: Negative.   Psychiatric/Behavioral: Negative.    Past Medical History  Diagnosis Date  . Allergy   . Asthma   . Hyperlipidemia   . Hypertension   . Depression   . GERD (gastroesophageal reflux disease)   . Peptic ulcer disease   . Palpitations   . Obstructive sleep apnea   . Multiple meningiomas of spine and brain   . Hyperglycemia     History   Social History  . Marital Status: Married    Spouse Name: N/A    Number of Children: N/A  . Years of Education: N/A   Occupational History  . Not on file.   Social History Main Topics  . Smoking status: Never Smoker   . Smokeless tobacco: Not on file  . Alcohol Use: Not on file  . Drug Use: Not on file  . Sexually Active: Not on file   Other Topics Concern  . Not on file   Social History Narrative  . No narrative on file    Past Surgical History  Procedure Date  . Abdominal hysterectomy   . Tonsillectomy   . Tubal ligation   . Right knee arthroscopy     x2  . Right foot bunectomy   . Right  shoulder rotator cuff   . L4-l5 posterior la   . C5-c6 neck fusion   . Left foot plantar and hammertoe   . Brain meningioma excision 8/10    Family History  Problem Relation Age of Onset  . Cancer Mother     ovarian  . Heart disease Father 23  . Arthritis Other   . Hyperlipidemia Other   . Hypertension Other   . Diabetes Other     Allergies  Allergen Reactions  . Aspirin Effervescent     REACTION: makes pt itch  . Morphine Sulfate     REACTION: hives  . Penicillins     REACTION: rash    Current Outpatient Prescriptions on File Prior to Visit  Medication Sig Dispense Refill  . albuterol (PROVENTIL,VENTOLIN) 90 MCG/ACT inhaler Inhale 2 puffs into the lungs every 6 (six) hours as needed.        . cetirizine (ZYRTEC) 10 MG tablet Take 10 mg by mouth daily.        . Colesevelam HCl 3.75 G PACK Take 1 each by mouth daily.       . diazepam (VALIUM) 5 MG tablet Take 1/2 tab by mouth once daily as needed       . fluticasone (FLONASE)  50 MCG/ACT nasal spray Place 2 sprays into the nose daily.        Marland Kitchen losartan-hydrochlorothiazide (HYZAAR) 100-12.5 MG per tablet Take 1 tablet by mouth daily.  90 tablet  3  . meclizine (ANTIVERT) 25 MG tablet Take 25 mg by mouth every 6 (six) hours as needed.        . metFORMIN (GLUCOPHAGE) 500 MG tablet Take 1 tablet (500 mg total) by mouth 2 (two) times daily with a meal.  180 tablet  3  . metoprolol succinate (TOPROL XL) 100 MG 24 hr tablet Take 1 tablet (100 mg total) by mouth daily.  90 tablet  3  . Multiple Vitamins-Minerals (ONE-A-DAY EXTRAS ANTIOXIDANT PO) Take by mouth daily.        . ONE TOUCH LANCETS MISC Check BS twice daily      . potassium chloride (K-DUR) 10 MEQ tablet Take 1 tablet (10 mEq total) by mouth daily.  90 tablet  2  . RABEprazole (ACIPHEX) 20 MG tablet Take 1 tablet (20 mg total) by mouth daily.  90 tablet  3  . solifenacin (VESICARE) 5 MG tablet Take 1 tablet (5 mg total) by mouth daily.  90 tablet  3  . valACYclovir  (VALTREX) 1000 MG tablet Take 1,000 mg by mouth 2 (two) times daily as needed.       . venlafaxine (EFFEXOR XR) 37.5 MG 24 hr capsule Take 1 capsule (37.5 mg total) by mouth daily.  30 capsule  0    BP 140/90  Pulse 76  Temp 98.4 F (36.9 C)  Resp 16  Ht 5\' 7"  (1.702 m)  Wt 227 lb (102.967 kg)  BMI 35.55 kg/m2chart    Objective:   Physical Exam  Constitutional: She is oriented to person, place, and time. She appears well-developed and well-nourished.  HENT:  Right Ear: External ear normal.  Left Ear: External ear normal.  Mouth/Throat: Oropharynx is clear and moist.  Neck: Normal range of motion. Neck supple.  Cardiovascular: Normal rate, regular rhythm and normal heart sounds.   Pulmonary/Chest: Effort normal and breath sounds normal.  Abdominal: Soft. Bowel sounds are normal.  Musculoskeletal: Normal range of motion.  Neurological: She is alert and oriented to person, place, and time.  Skin: Skin is warm and dry.  Psychiatric: She has a normal mood and affect.          Assessment & Plan:  Assessment: Eustachian tube dysfunction, upper respiratory infection, hypertension  Plan: Flonase 2 sprays in each nostril once daily, Zyrtec 10 mg daily, Zpak as directed. Avoid decongestants. Rest. Drink plenty of fluids. Call the office if symptoms worsen or persist. Recheck a schedule, and when necessary.

## 2011-03-19 ENCOUNTER — Telehealth: Payer: Self-pay | Admitting: *Deleted

## 2011-03-19 NOTE — Telephone Encounter (Signed)
LMTCB

## 2011-03-19 NOTE — Telephone Encounter (Signed)
Pt. Would like to speak to [place] about her problems with weaning off the Effexor.  She is complaining of "brain zap", jittery, nausea, and feeling weak".   Please call and let her know if she can do anything to help with this???

## 2011-03-19 NOTE — Telephone Encounter (Signed)
We cannot go any slower than 37.5 mg daily. After staying at this dose for 2-3 weeks recommend going to 37.5 mg every other day for 2 or 3 weeks and then discontinuing

## 2011-03-19 NOTE — Telephone Encounter (Signed)
Please advise 

## 2011-03-20 NOTE — Telephone Encounter (Signed)
Notified pt. 

## 2011-03-28 ENCOUNTER — Other Ambulatory Visit: Payer: Self-pay | Admitting: Neurosurgery

## 2011-03-28 DIAGNOSIS — D329 Benign neoplasm of meninges, unspecified: Secondary | ICD-10-CM

## 2011-03-31 ENCOUNTER — Inpatient Hospital Stay: Admission: RE | Admit: 2011-03-31 | Payer: Medicare Other | Source: Ambulatory Visit

## 2011-04-05 ENCOUNTER — Ambulatory Visit
Admission: RE | Admit: 2011-04-05 | Discharge: 2011-04-05 | Disposition: A | Discharge status: Home / Self Care | Payer: Medicare Other | Source: Ambulatory Visit | Attending: Neurosurgery | Admitting: Neurosurgery

## 2011-04-05 DIAGNOSIS — D329 Benign neoplasm of meninges, unspecified: Secondary | ICD-10-CM

## 2011-04-05 MED ORDER — GADOBENATE DIMEGLUMINE 529 MG/ML IV SOLN
20.0000 mL | Freq: Once | INTRAVENOUS | Status: AC | PRN
  Administered 2011-04-05: 20 mL via INTRAVENOUS

## 2011-04-10 ENCOUNTER — Telehealth: Payer: Self-pay | Admitting: *Deleted

## 2011-04-10 NOTE — Telephone Encounter (Signed)
Pt. Is asking if Dr Caryl Never would call in a Zpack for a sinus infection.  Nasal congestion with yellow mucus, and post nasal drainage.  Temp 99-100.  Chills.

## 2011-04-10 NOTE — Telephone Encounter (Signed)
Pt informed and will schedule OV for tomorrow

## 2011-04-10 NOTE — Telephone Encounter (Signed)
We really need to see if antibiotics anticipated.  We can get in tomorrow or offer her today with another provider.

## 2011-04-11 ENCOUNTER — Ambulatory Visit (INDEPENDENT_AMBULATORY_CARE_PROVIDER_SITE_OTHER): Payer: Medicare Other | Admitting: Family Medicine

## 2011-04-11 ENCOUNTER — Encounter: Payer: Self-pay | Admitting: Family Medicine

## 2011-04-11 VITALS — BP 142/92 | Temp 97.3°F | Wt 228.0 lb

## 2011-04-11 DIAGNOSIS — J329 Chronic sinusitis, unspecified: Secondary | ICD-10-CM

## 2011-04-11 MED ORDER — AZITHROMYCIN 250 MG PO TABS: ORAL_TABLET | ORAL | Status: DC

## 2011-04-11 NOTE — Progress Notes (Signed)
  Subjective:    Patient ID: Dawn Salas, female    DOB: [birth year], 71 y.o.   MRN: [id number]  HPI  Two-week history of progressive sinus symptoms. She has tendency toward allergies but these symptoms are somewhat different. She initially had some sore throat and still some postnasal drainage which is irritating at times. Takes Zyrtec regularly. Questionable low-grade fevers and night sweats past 2 nights. Occasional dry cough. Yellowish nasal discharge bilaterally. Increased malaise. She is hydrating well   Review of Systems  Constitutional: Positive for fever, chills and fatigue.  HENT: Positive for congestion, postnasal drip and sinus pressure.   Respiratory: Positive for cough.   Neurological: Negative for headaches.  Hematological: Negative for adenopathy.       Objective:   Physical Exam  Constitutional: She appears well-developed and well-nourished.  HENT:  Right Ear: External ear normal.  Left Ear: External ear normal.  Nose: Nose normal.  Mouth/Throat: Oropharynx is clear and moist.  Neck: Neck supple.  Cardiovascular: Normal rate and regular rhythm.   Pulmonary/Chest: Effort normal and breath sounds normal. No respiratory distress. She has no wheezes. She has no rales.  Lymphadenopathy:    She has no cervical adenopathy.          Assessment & Plan:  Acute sinusitis. Given duration of symptoms and progressive symptoms over 2 weeks start Zithromax. Continue saline nasal irrigation

## 2011-04-11 NOTE — Patient Instructions (Signed)

## 2011-05-09 ENCOUNTER — Other Ambulatory Visit: Payer: Self-pay | Admitting: *Deleted

## 2011-05-09 MED ORDER — METFORMIN HCL 500 MG PO TABS: 500.0000 mg | ORAL_TABLET | Freq: Two times a day (BID) | ORAL | Status: DC

## 2011-06-01 ENCOUNTER — Other Ambulatory Visit (HOSPITAL_COMMUNITY): Payer: Self-pay | Admitting: Neurosurgery

## 2011-06-01 DIAGNOSIS — D1800 Hemangioma unspecified site: Secondary | ICD-10-CM

## 2011-06-01 DIAGNOSIS — T1490XA Injury, unspecified, initial encounter: Secondary | ICD-10-CM

## 2011-06-06 ENCOUNTER — Ambulatory Visit (HOSPITAL_COMMUNITY)
Admission: RE | Admit: 2011-06-06 | Discharge: 2011-06-06 | Disposition: A | Discharge status: Home / Self Care | Payer: Medicare Other | Source: Ambulatory Visit | Attending: Neurosurgery | Admitting: Neurosurgery

## 2011-06-06 DIAGNOSIS — D1800 Hemangioma unspecified site: Secondary | ICD-10-CM

## 2011-06-06 DIAGNOSIS — D320 Benign neoplasm of cerebral meninges: Secondary | ICD-10-CM | POA: Insufficient documentation

## 2011-06-06 LAB — CREATININE, SERUM
Creatinine, Ser: 1.16 mg/dL — ABNORMAL HIGH (ref 0.50–1.10)
GFR calc Af Amer: 55 mL/min — ABNORMAL LOW (ref >90)
GFR calc non Af Amer: 47 mL/min — ABNORMAL LOW (ref >90)

## 2011-06-06 LAB — BUN: BUN: 15 mg/dL (ref 6–23)

## 2011-06-06 MED ORDER — GADOBENATE DIMEGLUMINE 529 MG/ML IV SOLN
20.0000 mL | Freq: Once | INTRAVENOUS | Status: AC
  Administered 2011-06-06: 20 mL via INTRAVENOUS

## 2011-06-08 ENCOUNTER — Other Ambulatory Visit: Payer: Self-pay | Admitting: Neurosurgery

## 2011-06-29 ENCOUNTER — Telehealth: Payer: Self-pay | Admitting: Gastroenterology

## 2011-06-29 ENCOUNTER — Telehealth: Payer: Self-pay | Admitting: *Deleted

## 2011-06-29 DIAGNOSIS — K219 Gastro-esophageal reflux disease without esophagitis: Secondary | ICD-10-CM

## 2011-06-29 MED ORDER — SOLIFENACIN SUCCINATE 5 MG PO TABS: 5.0000 mg | ORAL_TABLET | Freq: Every day | ORAL | Status: DC

## 2011-06-29 NOTE — Telephone Encounter (Signed)
Pt called in wanting a refill for vesicare. Told her I would check to see if an office visit is required prior to sending in Rx. Messaged [place].

## 2011-06-29 NOTE — Telephone Encounter (Signed)
Requesting refill for vesicare

## 2011-07-07 ENCOUNTER — Ambulatory Visit (INDEPENDENT_AMBULATORY_CARE_PROVIDER_SITE_OTHER): Payer: Medicare Other | Admitting: Family Medicine

## 2011-07-07 ENCOUNTER — Encounter: Payer: Self-pay | Admitting: Family Medicine

## 2011-07-07 VITALS — BP 130/80 | HR 87 | Temp 97.5°F | Wt 223.0 lb

## 2011-07-07 DIAGNOSIS — R1032 Left lower quadrant pain: Secondary | ICD-10-CM

## 2011-07-07 MED ORDER — METRONIDAZOLE 500 MG PO TABS: 500.0000 mg | ORAL_TABLET | Freq: Three times a day (TID) | ORAL | Status: AC

## 2011-07-07 MED ORDER — CIPROFLOXACIN HCL 500 MG PO TABS: 500.0000 mg | ORAL_TABLET | Freq: Two times a day (BID) | ORAL | Status: AC

## 2011-07-07 NOTE — Patient Instructions (Addendum)
You likely have diverticulitis.  I would start the antibiotics today. If you have a fever or significant increase in abdominal pain, then go to the ER.  Take care.

## 2011-07-07 NOTE — Assessment & Plan Note (Signed)
Known diverticulosis per patient. Presumed diverticulitis.  Nontoxic.  D/w pt about scan vs starting abx.  Would be reasonable to start abx. If worse, to ER.  She agrees.  F/u prn.

## 2011-07-07 NOTE — Progress Notes (Signed)
L sided abd pain.  Going on since yesterday, constant.  Sometimes the pain is worse than others.  She thought she had gas last night and took some mylanta with some relief.  Area isn't sore to touch but can be sore with certain movements.  Pain is sharp and comes down the L side of the abd.  H/o diverticulosis. No FCNAVD.  No blood in stool.  Last BM this AM, normal.  No change in urination. No blood in urine.  Pain is not severe. No rash.    Meds, vitals, and allergies reviewed.   ROS: See HPI.  Otherwise, noncontributory.  nad ncat rrr ctab abd soft, not ttp on R side, but ttp on L side, esp LLQ, no rebound.  +BS Ext with trace edema No cva pain

## 2011-07-12 ENCOUNTER — Encounter: Payer: Self-pay | Admitting: Family Medicine

## 2011-07-12 ENCOUNTER — Ambulatory Visit (INDEPENDENT_AMBULATORY_CARE_PROVIDER_SITE_OTHER): Payer: Medicare Other | Admitting: Family Medicine

## 2011-07-12 VITALS — BP 140/80 | HR 120 | Temp 98.7°F | Wt 224.0 lb

## 2011-07-12 DIAGNOSIS — R109 Unspecified abdominal pain: Secondary | ICD-10-CM

## 2011-07-12 LAB — POCT URINALYSIS DIPSTICK
Bilirubin, UA: NEGATIVE
Blood, UA: NEGATIVE
Glucose, UA: NEGATIVE
Ketones, UA: NEGATIVE
Nitrite, UA: NEGATIVE
Protein, UA: NEGATIVE
Spec Grav, UA: 1.01
Urobilinogen, UA: 0.2
pH, UA: 6.5

## 2011-07-12 NOTE — Patient Instructions (Addendum)
Hold Flagyl/metronidazole for now. Drink plenty of fluids. Followup promptly for any recurrent abdominal pain or fever

## 2011-07-12 NOTE — Progress Notes (Signed)
  Subjective:    Patient ID: Dawn Salas, female    DOB: 10-08-1940, 71 y.o.   MRN: [id number]  HPI  Followup regarding left-sided abdominal pain. Refer to recent note. Patient last weekend developed pain somewhat left upper quadrant but radiating toward left lower quadrant. Was seen Saturday clinic and noted tenderness left lower quadrant. History of diverticulosis but no known history of diverticulitis. She was placed on Cipro and Flagyl. Her abdominal pain has improved at this point and basically resolved. She has no fever. No dysuria. Still some nausea without vomiting and feels fatigued and weak all over. She's had some mild tachycardia with heart rate around 105. She is drinking fairly well. No bloody stools. Occasional loose stool but no diarrhea.  No recent constipation issues  Patient recently saw her cardiologist and brings in copy today of stress echo which was normal. She denies any recent chest pains.  Past Medical History  Diagnosis Date  . Allergy   . Asthma   . Hyperlipidemia   . Hypertension   . Depression   . GERD (gastroesophageal reflux disease)   . Peptic ulcer disease   . Palpitations   . Obstructive sleep apnea   . Multiple meningiomas of spine and brain   . Hyperglycemia    Past Surgical History  Procedure Date  . Abdominal hysterectomy   . Tonsillectomy   . Tubal ligation   . Right knee arthroscopy     x2  . Right foot bunectomy   . Right shoulder rotator cuff   . L4-l5 posterior la   . C5-c6 neck fusion   . Left foot plantar and hammertoe   . Brain meningioma excision 8/10    reports that she has never smoked. She does not have any smokeless tobacco history on file. Her alcohol and drug histories not on file. family history includes Arthritis in her other; Cancer in her mother; Diabetes in her other; Heart disease (age of onset:33) in her father; Hyperlipidemia in her other; and Hypertension in her other. Allergies  Allergen Reactions  . Aspirin  Effervescent     REACTION: makes pt itch  . Morphine Sulfate     REACTION: hives  . Penicillins     REACTION: rash      Review of Systems  Constitutional: Negative for fever and chills.  Gastrointestinal: Negative for abdominal pain and blood in stool.  Genitourinary: Negative for dysuria and frequency.  Neurological: Negative for dizziness.       Objective:   Physical Exam  Constitutional: She appears well-developed and well-nourished.  HENT:       Tongue slightly dry otherwise clear  Neck: Neck supple.  Cardiovascular: Regular rhythm.  Exam reveals no gallop.        Heart rate around 100 but regular  Pulmonary/Chest: Effort normal and breath sounds normal. No respiratory distress. She has no wheezes. She has no rales.  Abdominal: Soft. Bowel sounds are normal. She exhibits no distension and no mass. There is no tenderness. There is no rebound and no guarding.  Musculoskeletal: She exhibits no edema.  Neurological: She is alert.          Assessment & Plan:  Recent left lower quadrant abdominal pain. Possible diverticulitis flare. Symptomatically improved and now having possible side effects medication. Try holding Flagyl and continue Cipro. Followup promptly for any recurrent abdominal pain or fever. Check urinalysis.  Doubt kidney stones.

## 2011-07-27 ENCOUNTER — Encounter (HOSPITAL_COMMUNITY): Payer: Self-pay

## 2011-08-06 ENCOUNTER — Encounter (HOSPITAL_COMMUNITY)
Admission: RE | Admit: 2011-08-06 | Discharge: 2011-08-06 | Disposition: A | Discharge status: Home / Self Care | Payer: Medicare Other | Source: Ambulatory Visit | Attending: Neurosurgery | Admitting: Neurosurgery

## 2011-08-06 ENCOUNTER — Encounter (HOSPITAL_COMMUNITY): Payer: Self-pay

## 2011-08-06 HISTORY — DX: Unspecified osteoarthritis, unspecified site: M19.90

## 2011-08-06 HISTORY — DX: Thyrotoxicosis, unspecified without thyrotoxic crisis or storm: E05.90

## 2011-08-06 LAB — BASIC METABOLIC PANEL
BUN: 15 mg/dL (ref 6–23)
CO2: 27 mEq/L (ref 19–32)
Calcium: 9.5 mg/dL (ref 8.4–10.5)
Chloride: 100 mEq/L (ref 96–112)
Creatinine, Ser: 1.1 mg/dL (ref 0.50–1.10)
GFR calc Af Amer: 58 mL/min — ABNORMAL LOW (ref >90)
GFR calc non Af Amer: 50 mL/min — ABNORMAL LOW (ref >90)
Glucose, Bld: 135 mg/dL — ABNORMAL HIGH (ref 70–99)
Potassium: 4.6 mEq/L (ref 3.5–5.1)
Sodium: 139 mEq/L (ref 135–145)

## 2011-08-06 LAB — CBC
HCT: 37.4 % (ref 36.0–46.0)
Hemoglobin: 12.2 g/dL (ref 12.0–15.0)
MCH: 25.6 pg — ABNORMAL LOW (ref 26.0–34.0)
MCHC: 32.6 g/dL (ref 30.0–36.0)
MCV: 78.4 fL (ref 78.0–100.0)
Platelets: 174 10*3/uL (ref 150–400)
RBC: 4.77 MIL/uL (ref 3.87–5.11)
RDW: 14.6 % (ref 11.5–15.5)
WBC: 3.3 10*3/uL — ABNORMAL LOW (ref 4.0–10.5)

## 2011-08-06 LAB — SURGICAL PCR SCREEN
MRSA, PCR: NEGATIVE
Staphylococcus aureus: NEGATIVE

## 2011-08-06 LAB — TYPE AND SCREEN
ABO/RH(D): O POS
Antibody Screen: NEGATIVE

## 2011-08-06 NOTE — Progress Notes (Addendum)
Pt states sleep study done at [facility] ... However she does not know when it was done ... Over or under 5 years... Also she states she does not use her c pap ... Not able to locate  Sleep study in epic.   Left message at [facility] ... Requesting this sleep study ... [id number].   Sleep study on chart .

## 2011-08-06 NOTE — Pre-Procedure Instructions (Signed)
20 LOLAH COGHLAN  08/06/2011   Your procedure is scheduled on:  Monday 08/13/11  Report to [facility] at          [facility]  Call this number if you have problems the morning of surgery: [phone]   Remember:   Do not eat food:After Midnight.  May have  liquids:until Midnight .    Take these medicines the morning of surgery with A SIP OF WATER: ALBUTEROL INHALER(BRING WITH YOU DAY OF SURGERY)  VALIUM  MECLIZINE  METOPROLOL     Do not wear jewelry, make-up or nail polish.  Do not wear lotions, powders, or perfumes. You may wear deodorant.  Do not shave 48 hours prior to surgery. Men may shave face and neck.  Do not bring valuables to the hospital.  Contacts, dentures or bridgework may not be worn into surgery.  Leave suitcase in the car. After surgery it may be brought to your room.  For patients admitted to the hospital, checkout time is 11:00 AM the day of discharge.   Patients discharged the day of surgery will not be allowed to drive home.  Name and phone number of your driver:   Special Instructions: CHG Shower Use Special Wash: 1/2 bottle night before surgery and 1/2 bottle morning of surgery.   Please read over the following fact sheets that you were given: Pain Booklet, Coughing and Deep Breathing, Blood Transfusion Information, MRSA Information and Surgical Site Infection Prevention

## 2011-08-09 NOTE — Progress Notes (Signed)
Call to Dr. Hillis Range office, spoke with Asher Muir.  Requested CXR,EKG & last ov note.

## 2011-08-10 NOTE — Progress Notes (Signed)
Dr. Lilian Coma office called and spoke with medical records.  Will fax office notes, ekg, echo and chest xray today. Nickolas Madrid

## 2011-08-12 MED ORDER — VANCOMYCIN HCL 1000 MG IV SOLR
1500.0000 mg | INTRAVENOUS | Status: AC
  Administered 2011-08-13: 1500 mg via INTRAVENOUS
  Filled 2011-08-12: qty 1500

## 2011-08-13 ENCOUNTER — Encounter (HOSPITAL_COMMUNITY): Payer: Self-pay | Admitting: Anesthesiology

## 2011-08-13 ENCOUNTER — Ambulatory Visit (HOSPITAL_COMMUNITY): Payer: Medicare Other | Admitting: Anesthesiology

## 2011-08-13 ENCOUNTER — Encounter (HOSPITAL_COMMUNITY): Payer: Self-pay | Admitting: *Deleted

## 2011-08-13 ENCOUNTER — Encounter (HOSPITAL_COMMUNITY)
Admission: RE | Disposition: A | Discharge status: Home / Self Care | Payer: Self-pay | Source: Ambulatory Visit | Attending: Neurosurgery

## 2011-08-13 ENCOUNTER — Inpatient Hospital Stay (HOSPITAL_COMMUNITY)
Admission: RE | Admit: 2011-08-13 | Discharge: 2011-08-17 | DRG: 027 | Disposition: A | Discharge status: Home / Self Care | Payer: Medicare Other | Source: Ambulatory Visit | Attending: Neurosurgery | Admitting: Neurosurgery

## 2011-08-13 DIAGNOSIS — I10 Essential (primary) hypertension: Secondary | ICD-10-CM | POA: Diagnosis present

## 2011-08-13 DIAGNOSIS — E785 Hyperlipidemia, unspecified: Secondary | ICD-10-CM | POA: Diagnosis present

## 2011-08-13 DIAGNOSIS — D320 Benign neoplasm of cerebral meninges: Principal | ICD-10-CM | POA: Diagnosis present

## 2011-08-13 DIAGNOSIS — K219 Gastro-esophageal reflux disease without esophagitis: Secondary | ICD-10-CM | POA: Diagnosis present

## 2011-08-13 DIAGNOSIS — E0590 Thyrotoxicosis, unspecified without thyrotoxic crisis or storm: Secondary | ICD-10-CM | POA: Diagnosis present

## 2011-08-13 DIAGNOSIS — D329 Benign neoplasm of meninges, unspecified: Secondary | ICD-10-CM

## 2011-08-13 DIAGNOSIS — E119 Type 2 diabetes mellitus without complications: Secondary | ICD-10-CM | POA: Diagnosis present

## 2011-08-13 DIAGNOSIS — G4733 Obstructive sleep apnea (adult) (pediatric): Secondary | ICD-10-CM | POA: Diagnosis present

## 2011-08-13 HISTORY — PX: CRANIOTOMY: SHX93

## 2011-08-13 LAB — GLUCOSE, CAPILLARY
Glucose-Capillary: 105 mg/dL — ABNORMAL HIGH (ref 70–99)
Glucose-Capillary: 176 mg/dL — ABNORMAL HIGH (ref 70–99)
Glucose-Capillary: 216 mg/dL — ABNORMAL HIGH (ref 70–99)
Glucose-Capillary: 194 mg/dL — ABNORMAL HIGH (ref 70–99)

## 2011-08-13 SURGERY — CRANIOTOMY TUMOR EXCISION
Anesthesia: General | Site: Head | Laterality: Bilateral | Wound class: Clean

## 2011-08-13 MED ORDER — LACTATED RINGERS IV SOLN
INTRAVENOUS | Status: DC
  Administered 2011-08-13 – 2011-08-14 (×3): via INTRAVENOUS

## 2011-08-13 MED ORDER — VANCOMYCIN HCL 1000 MG IV SOLR
1500.0000 mg | INTRAVENOUS | Status: AC
  Administered 2011-08-14: 1500 mg via INTRAVENOUS
  Filled 2011-08-13: qty 1500

## 2011-08-13 MED ORDER — SODIUM CHLORIDE 0.9 % IV SOLN
INTRAVENOUS | Status: AC
  Filled 2011-08-13: qty 500

## 2011-08-13 MED ORDER — SODIUM CHLORIDE 0.9 % IR SOLN
Status: DC | PRN
  Administered 2011-08-13: 11:00:00

## 2011-08-13 MED ORDER — INSULIN ASPART 100 UNIT/ML ~~LOC~~ SOLN
0.0000 [IU] | SUBCUTANEOUS | Status: DC
Start: 2011-08-13 — End: 2011-08-17
  Administered 2011-08-13: 4 [IU] via SUBCUTANEOUS
  Administered 2011-08-13: 7 [IU] via SUBCUTANEOUS
  Administered 2011-08-14 (×3): 3 [IU] via SUBCUTANEOUS
  Administered 2011-08-14: 4 [IU] via SUBCUTANEOUS
  Administered 2011-08-14: 3 [IU] via SUBCUTANEOUS
  Administered 2011-08-14: 4 [IU] via SUBCUTANEOUS
  Administered 2011-08-15 (×2): 3 [IU] via SUBCUTANEOUS
  Administered 2011-08-15: 4 [IU] via SUBCUTANEOUS
  Administered 2011-08-15: 3 [IU] via SUBCUTANEOUS
  Filled 2011-08-13: qty 3
  Filled 2011-08-13: qty 0.2

## 2011-08-13 MED ORDER — ONDANSETRON HCL 4 MG/2ML IJ SOLN: 4.0000 mg | Freq: Four times a day (QID) | INTRAMUSCULAR | Status: DC | PRN

## 2011-08-13 MED ORDER — SODIUM CHLORIDE 0.9 % IV SOLN
INTRAVENOUS | Status: DC | PRN
  Administered 2011-08-13 (×2): via INTRAVENOUS

## 2011-08-13 MED ORDER — ALBUTEROL 90 MCG/ACT IN AERS: 2.0000 | INHALATION_SPRAY | Freq: Four times a day (QID) | RESPIRATORY_TRACT | Status: DC | PRN

## 2011-08-13 MED ORDER — 0.9 % SODIUM CHLORIDE (POUR BTL) OPTIME
TOPICAL | Status: DC | PRN
  Administered 2011-08-13 (×2): 1000 mL

## 2011-08-13 MED ORDER — METOPROLOL SUCCINATE ER 100 MG PO TB24
100.0000 mg | ORAL_TABLET | Freq: Every day | ORAL | Status: DC
  Administered 2011-08-14 – 2011-08-16 (×3): 100 mg via ORAL
  Filled 2011-08-13 (×4): qty 1

## 2011-08-13 MED ORDER — ATORVASTATIN CALCIUM 10 MG PO TABS
10.0000 mg | ORAL_TABLET | Freq: Every day | ORAL | Status: DC
  Administered 2011-08-13 – 2011-08-16 (×4): 10 mg via ORAL
  Filled 2011-08-13 (×5): qty 1

## 2011-08-13 MED ORDER — DEXAMETHASONE SODIUM PHOSPHATE 4 MG/ML IJ SOLN
INTRAMUSCULAR | Status: DC | PRN
  Administered 2011-08-13: 20 mg via INTRAVENOUS

## 2011-08-13 MED ORDER — HYDROMORPHONE HCL PF 1 MG/ML IJ SOLN: 0.2500 mg | INTRAMUSCULAR | Status: DC | PRN

## 2011-08-13 MED ORDER — ACETAMINOPHEN 325 MG PO TABS
650.0000 mg | ORAL_TABLET | ORAL | Status: DC | PRN
  Administered 2011-08-16: 650 mg via ORAL
  Filled 2011-08-13: qty 1
  Filled 2011-08-13: qty 2

## 2011-08-13 MED ORDER — BUPIVACAINE-EPINEPHRINE 0.5% -1:200000 IJ SOLN
INTRAMUSCULAR | Status: DC | PRN
  Administered 2011-08-13: 16 mL

## 2011-08-13 MED ORDER — METFORMIN HCL 500 MG PO TABS
500.0000 mg | ORAL_TABLET | Freq: Two times a day (BID) | ORAL | Status: DC
  Administered 2011-08-13 – 2011-08-16 (×7): 500 mg via ORAL
  Filled 2011-08-13 (×10): qty 1

## 2011-08-13 MED ORDER — HYDROCHLOROTHIAZIDE 12.5 MG PO CAPS
12.5000 mg | ORAL_CAPSULE | Freq: Every day | ORAL | Status: DC
  Administered 2011-08-13 – 2011-08-16 (×4): 12.5 mg via ORAL
  Filled 2011-08-13 (×5): qty 1

## 2011-08-13 MED ORDER — HYDROCODONE-ACETAMINOPHEN 5-325 MG PO TABS
1.0000 | ORAL_TABLET | ORAL | Status: DC | PRN
  Administered 2011-08-14 – 2011-08-15 (×4): 1 via ORAL
  Filled 2011-08-13 (×5): qty 1

## 2011-08-13 MED ORDER — MIDAZOLAM HCL 5 MG/5ML IJ SOLN
INTRAMUSCULAR | Status: DC | PRN
  Administered 2011-08-13 (×2): 1 mg via INTRAVENOUS

## 2011-08-13 MED ORDER — PROMETHAZINE HCL 25 MG PO TABS: 12.5000 mg | ORAL_TABLET | ORAL | Status: DC | PRN

## 2011-08-13 MED ORDER — PROPOFOL 10 MG/ML IV EMUL
INTRAVENOUS | Status: DC | PRN
  Administered 2011-08-13: 170 mg via INTRAVENOUS

## 2011-08-13 MED ORDER — COLESEVELAM HCL 625 MG PO TABS
3750.0000 mg | ORAL_TABLET | Freq: Every day | ORAL | Status: DC
  Administered 2011-08-14 – 2011-08-16 (×3): 3750 mg via ORAL
  Filled 2011-08-13 (×5): qty 6

## 2011-08-13 MED ORDER — DARIFENACIN HYDROBROMIDE ER 7.5 MG PO TB24
7.5000 mg | ORAL_TABLET | Freq: Every day | ORAL | Status: DC
  Administered 2011-08-13 – 2011-08-16 (×4): 7.5 mg via ORAL
  Filled 2011-08-13 (×5): qty 1

## 2011-08-13 MED ORDER — NEOSTIGMINE METHYLSULFATE 1 MG/ML IJ SOLN
INTRAMUSCULAR | Status: DC | PRN
  Administered 2011-08-13: 5 mg via INTRAVENOUS

## 2011-08-13 MED ORDER — DEXAMETHASONE SODIUM PHOSPHATE 10 MG/ML IJ SOLN
6.0000 mg | Freq: Four times a day (QID) | INTRAMUSCULAR | Status: AC
  Administered 2011-08-13 – 2011-08-14 (×4): 6 mg via INTRAVENOUS
  Filled 2011-08-13 (×4): qty 0.6

## 2011-08-13 MED ORDER — PHENYLEPHRINE HCL 10 MG/ML IJ SOLN
INTRAMUSCULAR | Status: DC | PRN
  Administered 2011-08-13: 40 ug via INTRAVENOUS
  Administered 2011-08-13: 80 ug via INTRAVENOUS
  Administered 2011-08-13: 40 ug via INTRAVENOUS
  Administered 2011-08-13 (×3): 80 ug via INTRAVENOUS

## 2011-08-13 MED ORDER — COLESEVELAM HCL 3.75 G PO PACK: 1.0000 | PACK | Freq: Every day | ORAL | Status: DC

## 2011-08-13 MED ORDER — ONDANSETRON HCL 4 MG/2ML IJ SOLN: 4.0000 mg | INTRAMUSCULAR | Status: DC | PRN

## 2011-08-13 MED ORDER — THROMBIN 20000 UNITS EX KIT
PACK | CUTANEOUS | Status: DC | PRN
  Administered 2011-08-13 (×2): via TOPICAL

## 2011-08-13 MED ORDER — DIAZEPAM 5 MG PO TABS
2.5000 mg | ORAL_TABLET | Freq: Every day | ORAL | Status: DC | PRN
  Administered 2011-08-15: 2.5 mg via ORAL
  Filled 2011-08-13: qty 1

## 2011-08-13 MED ORDER — ALBUTEROL SULFATE HFA 108 (90 BASE) MCG/ACT IN AERS: 2.0000 | INHALATION_SPRAY | Freq: Four times a day (QID) | RESPIRATORY_TRACT | Status: DC | PRN

## 2011-08-13 MED ORDER — PANTOPRAZOLE SODIUM 40 MG IV SOLR
40.0000 mg | Freq: Every day | INTRAVENOUS | Status: DC
  Administered 2011-08-13 – 2011-08-14 (×2): 40 mg via INTRAVENOUS
  Filled 2011-08-13 (×3): qty 40

## 2011-08-13 MED ORDER — SODIUM CHLORIDE 0.9 % IV SOLN
INTRAVENOUS | Status: DC | PRN
  Administered 2011-08-13 (×2): via INTRAVENOUS

## 2011-08-13 MED ORDER — LORATADINE 10 MG PO TABS
10.0000 mg | ORAL_TABLET | Freq: Every day | ORAL | Status: DC
  Administered 2011-08-13 – 2011-08-16 (×4): 10 mg via ORAL
  Filled 2011-08-13 (×5): qty 1

## 2011-08-13 MED ORDER — SODIUM CHLORIDE 0.9 % IV SOLN
500.0000 mg | Freq: Two times a day (BID) | INTRAVENOUS | Status: DC
  Administered 2011-08-13 – 2011-08-16 (×7): 500 mg via INTRAVENOUS
  Filled 2011-08-13 (×10): qty 5

## 2011-08-13 MED ORDER — POTASSIUM CHLORIDE ER 10 MEQ PO TBCR
10.0000 meq | EXTENDED_RELEASE_TABLET | Freq: Every day | ORAL | Status: DC
  Administered 2011-08-13 – 2011-08-16 (×4): 10 meq via ORAL
  Filled 2011-08-13 (×5): qty 1

## 2011-08-13 MED ORDER — THROMBIN 5000 UNITS EX SOLR
CUTANEOUS | Status: DC | PRN
  Administered 2011-08-13 (×2): 5000 [IU] via TOPICAL

## 2011-08-13 MED ORDER — DEXAMETHASONE SODIUM PHOSPHATE 4 MG/ML IJ SOLN
4.0000 mg | Freq: Three times a day (TID) | INTRAMUSCULAR | Status: DC
  Filled 2011-08-13 (×2): qty 1

## 2011-08-13 MED ORDER — ACETAMINOPHEN 650 MG RE SUPP: 650.0000 mg | RECTAL | Status: DC | PRN

## 2011-08-13 MED ORDER — GLYCOPYRROLATE 0.2 MG/ML IJ SOLN
INTRAMUSCULAR | Status: DC | PRN
  Administered 2011-08-13: .8 mg via INTRAVENOUS

## 2011-08-13 MED ORDER — ONDANSETRON HCL 4 MG/2ML IJ SOLN
INTRAMUSCULAR | Status: DC | PRN
  Administered 2011-08-13: 4 mg via INTRAVENOUS

## 2011-08-13 MED ORDER — MECLIZINE HCL 25 MG PO TABS
25.0000 mg | ORAL_TABLET | Freq: Four times a day (QID) | ORAL | Status: DC | PRN
  Filled 2011-08-13: qty 1

## 2011-08-13 MED ORDER — FENTANYL CITRATE 0.05 MG/ML IJ SOLN
INTRAMUSCULAR | Status: DC | PRN
  Administered 2011-08-13: 50 ug via INTRAVENOUS
  Administered 2011-08-13: 150 ug via INTRAVENOUS
  Administered 2011-08-13 (×3): 50 ug via INTRAVENOUS
  Administered 2011-08-13: 100 ug via INTRAVENOUS
  Administered 2011-08-13: 50 ug via INTRAVENOUS

## 2011-08-13 MED ORDER — BACITRACIN ZINC 500 UNIT/GM EX OINT
TOPICAL_OINTMENT | CUTANEOUS | Status: DC | PRN
  Administered 2011-08-13: 1 via TOPICAL

## 2011-08-13 MED ORDER — LABETALOL HCL 5 MG/ML IV SOLN
INTRAVENOUS | Status: DC | PRN
  Administered 2011-08-13 (×2): 5 mg via INTRAVENOUS

## 2011-08-13 MED ORDER — DEXAMETHASONE SODIUM PHOSPHATE 4 MG/ML IJ SOLN
4.0000 mg | Freq: Four times a day (QID) | INTRAMUSCULAR | Status: DC
  Administered 2011-08-14 – 2011-08-15 (×3): 4 mg via INTRAVENOUS
  Filled 2011-08-13 (×5): qty 1

## 2011-08-13 MED ORDER — LOSARTAN POTASSIUM 50 MG PO TABS
100.0000 mg | ORAL_TABLET | Freq: Every day | ORAL | Status: DC
  Administered 2011-08-13 – 2011-08-16 (×4): 100 mg via ORAL
  Filled 2011-08-13 (×5): qty 2

## 2011-08-13 MED ORDER — FUROSEMIDE 20 MG PO TABS
20.0000 mg | ORAL_TABLET | Freq: Every day | ORAL | Status: DC | PRN
  Administered 2011-08-13: 20 mg via ORAL
  Filled 2011-08-13: qty 1

## 2011-08-13 MED ORDER — HYDROMORPHONE HCL PF 1 MG/ML IJ SOLN
0.5000 mg | INTRAMUSCULAR | Status: DC | PRN
  Administered 2011-08-13: 1 mg via INTRAVENOUS
  Filled 2011-08-13: qty 1

## 2011-08-13 MED ORDER — MICROFIBRILLAR COLL HEMOSTAT EX PADS
MEDICATED_PAD | CUTANEOUS | Status: DC | PRN
  Administered 2011-08-13: 1 via TOPICAL

## 2011-08-13 MED ORDER — ONDANSETRON HCL 4 MG PO TABS: 4.0000 mg | ORAL_TABLET | ORAL | Status: DC | PRN

## 2011-08-13 MED ORDER — DOCUSATE SODIUM 100 MG PO CAPS
100.0000 mg | ORAL_CAPSULE | Freq: Two times a day (BID) | ORAL | Status: DC
  Administered 2011-08-13 – 2011-08-16 (×6): 100 mg via ORAL
  Filled 2011-08-13 (×8): qty 1

## 2011-08-13 MED ORDER — HEMOSTATIC AGENTS (NO CHARGE) OPTIME
TOPICAL | Status: DC | PRN
  Administered 2011-08-13: 1 via TOPICAL

## 2011-08-13 MED ORDER — LOSARTAN POTASSIUM-HCTZ 100-12.5 MG PO TABS: 1.0000 | ORAL_TABLET | Freq: Every day | ORAL | Status: DC

## 2011-08-13 MED ORDER — BACITRACIN 50000 UNITS IM SOLR
INTRAMUSCULAR | Status: AC
  Filled 2011-08-13: qty 1

## 2011-08-13 MED ORDER — PHENYLEPHRINE HCL 10 MG/ML IJ SOLN
10.0000 mg | INTRAVENOUS | Status: DC | PRN
  Administered 2011-08-13: 1 ug/min via INTRAVENOUS

## 2011-08-13 MED ORDER — ROCURONIUM BROMIDE 100 MG/10ML IV SOLN
INTRAVENOUS | Status: DC | PRN
  Administered 2011-08-13: 20 mg via INTRAVENOUS
  Administered 2011-08-13: 10 mg via INTRAVENOUS
  Administered 2011-08-13: 20 mg via INTRAVENOUS
  Administered 2011-08-13: 50 mg via INTRAVENOUS
  Administered 2011-08-13: 20 mg via INTRAVENOUS

## 2011-08-13 MED ORDER — LABETALOL HCL 5 MG/ML IV SOLN
10.0000 mg | INTRAVENOUS | Status: DC | PRN
  Administered 2011-08-13 (×3): 10 mg via INTRAVENOUS
  Administered 2011-08-14: 20 mg via INTRAVENOUS
  Filled 2011-08-13 (×3): qty 4

## 2011-08-13 MED ORDER — FLUTICASONE PROPIONATE 50 MCG/ACT NA SUSP: 2.0000 | Freq: Every day | NASAL | Status: DC | PRN

## 2011-08-13 MED ORDER — PANTOPRAZOLE SODIUM 40 MG PO TBEC: 40.0000 mg | DELAYED_RELEASE_TABLET | Freq: Every day | ORAL | Status: DC

## 2011-08-13 SURGICAL SUPPLY — 95 items
BAG DECANTER FOR FLEXI CONT (MISCELLANEOUS) ×2 IMPLANT
BIT DRILL WIRE PASS 1.3MM (BIT) IMPLANT
BLADE CLIPPER SURG NEURO (BLADE) ×1 IMPLANT
BLADE ULTRA TIP 2M (BLADE) IMPLANT
BRUSH SCRUB EZ PLAIN DRY (MISCELLANEOUS) ×2 IMPLANT
BUR ACORN 6.0 PRECISION (BURR) ×2 IMPLANT
BUR ROUTER D-58 CRANI (BURR) ×1 IMPLANT
CANISTER SUCTION 2500CC (MISCELLANEOUS) ×3 IMPLANT
CATH VENTRIC 35X38 W/TROCAR LG (CATHETERS) IMPLANT
CLIP RANEY DISP (INSTRUMENTS) ×1 IMPLANT
CLIP TI MEDIUM 6 (CLIP) IMPLANT
CLOTH BEACON ORANGE TIMEOUT ST (SAFETY) ×2 IMPLANT
CONT SPEC 4OZ CLIKSEAL STRL BL (MISCELLANEOUS) ×4 IMPLANT
CORDS BIPOLAR (ELECTRODE) ×2 IMPLANT
COVER TABLE BACK 60X90 (DRAPES) ×1 IMPLANT
D-58 TAPERED ROUTER 1.7MMX25.0MM ×1 IMPLANT
DRAIN SNY 10X20 3/4 PERF (WOUND CARE) ×1 IMPLANT
DRAIN SNY WOU 7FLT (WOUND CARE) IMPLANT
DRAPE MICROSCOPE LEICA (MISCELLANEOUS) ×1 IMPLANT
DRAPE NEUROLOGICAL W/INCISE (DRAPES) ×2 IMPLANT
DRAPE SURG 17X23 STRL (DRAPES) ×2 IMPLANT
DRAPE WARM FLUID 44X44 (DRAPE) ×2 IMPLANT
DRESSING TELFA 8X3 (GAUZE/BANDAGES/DRESSINGS) ×1 IMPLANT
DRILL WIRE PASS 1.3MM (BIT)
DURAGUARD 06CMX08CM ×1 IMPLANT
ELECT CAUTERY BLADE 6.4 (BLADE) ×2 IMPLANT
ELECT REM PT RETURN 9FT ADLT (ELECTROSURGICAL) ×2
ELECTRODE REM PT RTRN 9FT ADLT (ELECTROSURGICAL) ×1 IMPLANT
EVACUATOR 1/8 PVC DRAIN (DRAIN) IMPLANT
EVACUATOR SILICONE 100CC (DRAIN) ×1 IMPLANT
FORCEPS BIPOLAR SPETZLER 8 1.0 (NEUROSURGERY SUPPLIES) ×1 IMPLANT
GAUZE SPONGE 4X4 16PLY XRAY LF (GAUZE/BANDAGES/DRESSINGS) IMPLANT
GLOVE BIO SURGEON STRL SZ8.5 (GLOVE) ×2 IMPLANT
GLOVE BIOGEL PI IND STRL 7.5 (GLOVE) IMPLANT
GLOVE BIOGEL PI IND STRL 8.5 (GLOVE) IMPLANT
GLOVE BIOGEL PI INDICATOR 7.5 (GLOVE) ×1
GLOVE BIOGEL PI INDICATOR 8.5 (GLOVE) ×2
GLOVE ECLIPSE 7.5 STRL STRAW (GLOVE) ×2 IMPLANT
GLOVE ECLIPSE 8.5 STRL (GLOVE) ×1 IMPLANT
GLOVE EXAM NITRILE LRG STRL (GLOVE) IMPLANT
GLOVE EXAM NITRILE MD LF STRL (GLOVE) ×1 IMPLANT
GLOVE EXAM NITRILE XL STR (GLOVE) IMPLANT
GLOVE EXAM NITRILE XS STR PU (GLOVE) IMPLANT
GLOVE SS BIOGEL STRL SZ 8 (GLOVE) ×1 IMPLANT
GLOVE SUPERSENSE BIOGEL SZ 8 (GLOVE) ×1
GLOVE SURG SS PI 8.0 STRL IVOR (GLOVE) ×2 IMPLANT
GOWN BRE IMP SLV AUR LG STRL (GOWN DISPOSABLE) IMPLANT
GOWN BRE IMP SLV AUR XL STRL (GOWN DISPOSABLE) ×2 IMPLANT
GOWN PREVENTION PLUS XXLARGE (GOWN DISPOSABLE) ×2 IMPLANT
GOWN STRL REIN 2XL LVL4 (GOWN DISPOSABLE) ×1 IMPLANT
HEMOSTAT SURGICEL 2X14 (HEMOSTASIS) ×2 IMPLANT
KIT BASIN OR (CUSTOM PROCEDURE TRAY) ×2 IMPLANT
KIT DRAIN CSF ACCUDRAIN (MISCELLANEOUS) IMPLANT
KIT ROOM TURNOVER OR (KITS) ×2 IMPLANT
MARKER SKIN DUAL TIP RULER LAB (MISCELLANEOUS) IMPLANT
MARKER SPHERE PSV REFLC NDI (MISCELLANEOUS) ×2 IMPLANT
NEEDLE HYPO 22GX1.5 SAFETY (NEEDLE) ×2 IMPLANT
NS IRRIG 1000ML POUR BTL (IV SOLUTION) ×4 IMPLANT
PACK CRANIOTOMY (CUSTOM PROCEDURE TRAY) ×2 IMPLANT
PAD ARMBOARD 7.5X6 YLW CONV (MISCELLANEOUS) ×4 IMPLANT
PATTIES SURGICAL .25X.25 (GAUZE/BANDAGES/DRESSINGS) IMPLANT
PATTIES SURGICAL .5 X.5 (GAUZE/BANDAGES/DRESSINGS) IMPLANT
PATTIES SURGICAL .5 X3 (DISPOSABLE) ×1 IMPLANT
PATTIES SURGICAL 1X1 (DISPOSABLE) ×2 IMPLANT
PENCIL BUTTON HOLSTER BLD 10FT (ELECTRODE) ×1 IMPLANT
PIN MAYFIELD SKULL DISP (PIN) IMPLANT
PLATE 1.5/0.5 13MM BURR HOLE (Plate) ×4 IMPLANT
RUBBERBAND STERILE (MISCELLANEOUS) ×4 IMPLANT
SCREW SELF DRILL HT 1.5/4MM (Screw) ×13 IMPLANT
SPECIMEN JAR SMALL (MISCELLANEOUS) ×1 IMPLANT
SPONGE GAUZE 4X4 12PLY (GAUZE/BANDAGES/DRESSINGS) ×2 IMPLANT
SPONGE NEURO XRAY DETECT 1X3 (DISPOSABLE) ×1 IMPLANT
SPONGE SURGIFOAM ABS GEL 100 (HEMOSTASIS) ×3 IMPLANT
SPONGE SURGIFOAM ABS GEL SZ50 (HEMOSTASIS) ×1 IMPLANT
STAPLER SKIN PROX WIDE 3.9 (STAPLE) ×3 IMPLANT
STOCKINETTE 6  STRL (DRAPES)
STOCKINETTE 6 STRL (DRAPES) IMPLANT
SUT ETHILON 3 0 FSL (SUTURE) ×1 IMPLANT
SUT ETHILON 3 0 PS 1 (SUTURE) IMPLANT
SUT NURALON 4 0 TR CR/8 (SUTURE) ×5 IMPLANT
SUT PROLENE 6 0 BV (SUTURE) IMPLANT
SUT SILK 0 TIES 10X30 (SUTURE) IMPLANT
SUT VIC AB 2-0 CP2 18 (SUTURE) ×5 IMPLANT
SUT VIC AB 3-0 FS2 27 (SUTURE) IMPLANT
SUT VICRYL 4-0 PS2 18IN ABS (SUTURE) IMPLANT
SYR 20ML ECCENTRIC (SYRINGE) ×2 IMPLANT
SYR CONTROL 10ML LL (SYRINGE) ×2 IMPLANT
TAPE CLOTH SURG 4X10 WHT LF (GAUZE/BANDAGES/DRESSINGS) ×1 IMPLANT
THREADED SPHERZ PASSIVE REFLECTIVE  RADIOPAQUE MARKERS IMPLANT
TOWEL OR 17X24 6PK STRL BLUE (TOWEL DISPOSABLE) ×2 IMPLANT
TOWEL OR 17X26 10 PK STRL BLUE (TOWEL DISPOSABLE) ×2 IMPLANT
TRAY FOLEY CATH 14FRSI W/METER (CATHETERS) IMPLANT
TUBE CONNECTING 12X1/4 (SUCTIONS) ×1 IMPLANT
UNDERPAD 30X30 INCONTINENT (UNDERPADS AND DIAPERS) IMPLANT
WATER STERILE IRR 1000ML POUR (IV SOLUTION) ×2 IMPLANT

## 2011-08-13 NOTE — Anesthesia Postprocedure Evaluation (Signed)
Anesthesia Post Note  Patient: Dawn Salas  Procedure(s) Performed: Procedure(s) (LRB): CRANIOTOMY TUMOR EXCISION (Bilateral)  Anesthesia type: General  Patient location: PACU  Post pain: Pain level controlled and Adequate analgesia  Post assessment: Post-op Vital signs reviewed, Patient's Cardiovascular Status Stable, Respiratory Function Stable, Patent Airway and Pain level controlled  Last Vitals:  Filed Vitals:   08/13/11 1500  BP: 154/73  Pulse:   Temp:   Resp:     Post vital signs: Reviewed and stable  Level of consciousness: awake, alert  and oriented  Complications: No apparent anesthesia complications

## 2011-08-13 NOTE — Anesthesia Preprocedure Evaluation (Addendum)
Anesthesia Evaluation  Patient identified by MRN, date of birth, ID band Patient awake    Reviewed: Allergy & Precautions, H&P , NPO status , Patient's Chart, lab work & pertinent test results  Airway Mallampati: II TM Distance: >3 FB Neck ROM: full    Dental  (+) Edentulous Upper and Dental Advisory Given   Pulmonary asthma , sleep apnea and Continuous Positive Airway Pressure Ventilation ,          Cardiovascular hypertension, Pt. on home beta blockers     Neuro/Psych PSYCHIATRIC DISORDERS Depression H/o meningiomas    GI/Hepatic PUD, GERD-  Medicated and Controlled,  Endo/Other  Well Controlled, Type 2, Oral Hypoglycemic AgentsHyperthyroidism obese  Renal/GU   negative genitourinary   Musculoskeletal negative musculoskeletal ROS (+)   Abdominal   Peds  Hematology negative hematology ROS (+)   Anesthesia Other Findings   Reproductive/Obstetrics negative OB ROS                         Anesthesia Physical Anesthesia Plan  ASA: III  Anesthesia Plan: General   Post-op Pain Management:    Induction: Intravenous  Airway Management Planned: Oral ETT  Additional Equipment: Arterial line  Intra-op Plan:   Post-operative Plan: Extubation in OR  Informed Consent: I have reviewed the patients History and Physical, chart, labs and discussed the procedure including the risks, benefits and alternatives for the proposed anesthesia with the patient or authorized representative who has indicated his/her understanding and acceptance.     Plan Discussed with: CRNA and Surgeon  Anesthesia Plan Comments:         Anesthesia Quick Evaluation

## 2011-08-13 NOTE — Progress Notes (Signed)
ANTIBIOTIC CONSULT NOTE - INITIAL  Pharmacy Consult for Vancomycin x24 hours Indication: Surgical Prophylaxis  Allergies  Allergen Reactions  . Flagyl (Metronidazole) Other (See Comments)    Caused increased heart rate  . Aspirin Effervescent Swelling    Alka-Seltzer gel caps- sore mouth, face swollen, turned hands white  . Morphine Sulfate     REACTION: hives  . Penicillins     REACTION: rash    Patient Measurements: Height: 5\' 7"  (170.2 cm) Weight: 224 lb 6.9 oz (101.8 kg) IBW/kg (Calculated) : 61.6   Vital Signs: Temp: 97.5 F (36.4 C) (07/22 1615) Temp src: Oral (07/22 0701) BP: 150/69 mmHg (07/22 1615) Pulse Rate: 76  (07/22 1453) Intake/Output from previous day:   Intake/Output from this shift: Total I/O In: 2875 [I.V.:2700; Other:175] Out: 955 [Urine:630; Blood:325]  Labs: No results found for this basename: WBC:3,HGB:3,PLT:3,LABCREA:3,CREATININE:3 in the last 72 hours Estimated Creatinine Clearance: 60 ml/min (by C-G formula based on Cr of 1.1). No results found for this basename: VANCOTROUGH:2,VANCOPEAK:2,VANCORANDOM:2,GENTTROUGH:2,GENTPEAK:2,GENTRANDOM:2,TOBRATROUGH:2,TOBRAPEAK:2,TOBRARND:2,AMIKACINPEAK:2,AMIKACINTROU:2,AMIKACIN:2, in the last 72 hours   Microbiology: Recent Results (from the past 720 hour(s))  SURGICAL PCR SCREEN     Status: Normal   Collection Time   08/06/11  9:52 AM      Component Value Range Status Comment   MRSA, PCR NEGATIVE  NEGATIVE Final    Staphylococcus aureus NEGATIVE  NEGATIVE Final     Medical History: Past Medical History  Diagnosis Date  . Allergy   . Asthma   . Hyperlipidemia   . Hypertension   . Depression   . GERD (gastroesophageal reflux disease)   . Peptic ulcer disease   . Palpitations   . Multiple meningiomas of spine and brain   . Hyperglycemia   . Obstructive sleep apnea     STUDY AT [facility] DOES NOT USE C PAP  . Diabetes mellitus     TYPE ONE  . Hyperthyroidism     PT HAD BIPOSY -NEGATIVE.Marland Kitchen AND  NOW JUST GETS CHECKED EACH YEAR .Marland Kitchen 2013 LAST TEST   SEES DR. [name].   Marland Kitchen Arthritis     Medications:  Anti-infectives     Start     Dose/Rate Route Frequency Ordered Stop   08/13/11 1110   bacitracin 50,000 Units in sodium chloride irrigation 0.9 % 500 mL irrigation  Status:  Discontinued          As needed 08/13/11 1110 08/13/11 1448   08/13/11 0844   bacitracin 11914 UNITS injection     Comments: DAY, DORY: cabinet override         08/13/11 0844 08/13/11 2044   08/13/11 0000   vancomycin (VANCOCIN) 1,500 mg in sodium chloride 0.9 % 500 mL IVPB        1,500 mg 250 mL/hr over 120 Minutes Intravenous 120 min pre-op 08/12/11 0942 08/13/11 0917         Assessment: 70 YOF s/p excision of multiple intracranial meningiomas with a penicillin allergy to receive Vancomycin for 24 hours post-op for surgical prophylaxis. 1500mg  of Vancomycin was given pre-op at 0917AM. Patient is afebrile. CBC from 08/06/11 -wbc was 3.3. EstCrCl~60 mL/min. Wt = 101.8 kg.   AET = 1459 on 08/13/2011  Goal of Therapy:  Vancomycin trough level 15-20 mcg/ml  Plan:  1. Vancomycin 1500mg  IV q24h x1. - next dose in AM.  2. Will follow-up to confirm no further infection.   Link Snuffer, PharmD, BCPS Clinical Pharmacist [phone] 08/13/2011,5:00 PM

## 2011-08-13 NOTE — Progress Notes (Signed)
Patient ID: Dawn Salas, female   DOB: [birth year], 71 y.o.   MRN: [id number] Subjective:  Patient is mildly somnolent but easily arousable. She is pleasant and has no complaints.  Objective: Vital signs in last 24 hours: Temp:  [97.1 F (36.2 C)-98.9 F (37.2 C)] 98.9 F (37.2 C) (07/22 1944) Pulse Rate:  [72-92] 83  (07/22 2030) Resp:  [12-27] 16  (07/22 2030) BP: (115-171)/(61-89) 123/62 mmHg (07/22 2015) SpO2:  [99 %-100 %] 100 % (07/22 2030) Arterial Line BP: (136-203)/(64-91) 169/83 mmHg (07/22 1950) Weight:  [101.8 kg (224 lb 6.9 oz)] 101.8 kg (224 lb 6.9 oz) (07/22 1615)  Intake/Output from previous day:   Intake/Output this shift: Total I/O In: 180 [I.V.:75; IV Piggyback:105] Out: 92 [Urine:37; Drains:55]  Physical exam Glasgow Coma Scale 14([id number]). The patient is mildly somnolent but easily arousable. She is oriented to person and hospital. She is moving all 4 extremities well. Her speech is normal. Her pupils are equal. Her dressing is clean and dry.  Lab Results: No results found for this basename: WBC:2,HGB:2,HCT:2,PLT:2 in the last 72 hours BMET No results found for this basename: NA:2,K:2,CL:2,CO2:2,GLUCOSE:2,BUN:2,CREATININE:2,CALCIUM:2 in the last 72 hours  Studies/Results: No results found.  Assessment/Plan: The patient is doing well.  LOS: 0 days     Dawn Salas D 08/13/2011, 8:33 PM

## 2011-08-13 NOTE — Op Note (Signed)
Brief history: The patient is a 71 year old black female who has multiple intracranial meningiomas. I resected a left lesion several years ago. The patient has done well. We have followed her with serial MRI scans. These have demonstrated a inner hemispheric frontal lesion is enlarging. We discussed the various treatment options including surgery. The patient has weighed the risks, benefits, and alternatives surgery and decided proceed with a bifrontal craniotomy for resection of the meningioma.  Preop diagnosis: Multiple intracranial meningiomas  Postop diagnosis: The same  Procedure: Bifrontal craniotomy for gross a resection of a frontal inner hemispheric tumor as well as a second more posterior left-sided tumor .  Surgeon: Dr. Delma Officer  Assistant: Dr. Barnett Abu  Anesthesia: Gen. endotracheal  Specimens: Tumor  Drains one subgaleal Jackson-Pratt drain  Complications: None  Estimated blood loss is 250 cc  Description of procedure: The patient was brought to the operating room by the anesthesia team. General endotracheal anesthesia was induced. The patient remained in the supine position. I applied the Mayfield 3 point headrest the patient's calvarium. Her bifrontal region was then shaved with clippers and prepared with Betadine scrub and Betadine solution. Sterile drapes were applied. I then injected the area to be incised with Marcaine with epinephrine solution. I then used a scalpel to make a bifrontal craniotomy incision. We used Raney clips report date hemostasis. I used electrocautery  to divide the temporalis fascia and muscle bilaterally. We then used a periosteal elevator to expose the underlying calvarium. I used towel clamps and rubber bands and hemostats for scalp traction.  I then used a high-speed drill to create 2 frontal burr holes and 2 parietal bur holes straddling the superior sagittal sinus. I then used a plate device to create a craniotomy flap. The patient's  calvarium is quite thick. We crossed the sinus last. We do not lacerated the sinus. We tacked epidural edges. I then used a 15 blade scalpel to incise the dura. We used the Secondary school teacher for exposure. We carefully dissected and exposed the falx on the right side. With minimal retraction of the frontal lobe we exposed the large inner hemispheric meningioma. The tumor was gray and quite soft and we easily dissected away from the surrounding brain. I then used suction and bipolar electrocautery to clear out the center of the tumor to give [place] a working room. We then approached the tumor from the left side with no retraction of the brain. We were easily able to define a plane between the tumor and the brain. We carefully identified the anterior cerebral arteries. I then coagulated the falx which was surrounding the tumor and resected it using the scissors. We got a gross total resection of the tumor.  We now turned attention to the smaller posterior tumors on the left. I was easily able to dissect posteriorly and identify the more anterior of the small tumors. We got a gross total resection of this tumor. The more posterior smaller tumor was surrounded by large veins. I did not think it was safe to remove this tumor as it was quite small and I didn't want to create a a venous infarct. We left the smaller posterior tumor.  We then obtained hemostasis using electrocautery and Gelfoam. We irrigated the wound out with saline solution and took a macrocytic clear. We reapproximated the patient's dura with interrupted 4-0 Nurolon suture. We inspected this appears sagittal sinus it did not appear injured and appeared patent. I laid Gelfoam over the exposed dura over the superior  sagittal sinus as it was oozing a bit. We laid some Dura-Guard over some areas of the dura which were not able to be reapproximated. We then replaced the patient craniotomy flap with burr hole covers and mini plates and screws.  We then  removed the scalp retractors and reapproximated patient's temporalis fascia muscle with interrupted 2-0 Vicryl suture. We placed a 10 mm flat Jackson-Pratt drain in the subgaleal space. We tunneled out through separate stab wound. We reapproximated patient's galea with interrupted 2-0 Vicryl suture. In the patient's right temporal region suture On pulling through the galea so we reapproximated the skin with a running 3-0 nylon suture. We reapproximated the remainder of the patient's skin with stainless steel staples. The wound was then coated with bacitracin ointment. A sterile dressing was applied. The drapes were removed. The Mayfield 3 point headrest was removed the patient's calvarium. The patient was subsequently extubated by the anesthesia team and transported to the post anesthesia care unit in stable condition by report all sponge instrument and needle counts were correct at the this case.

## 2011-08-13 NOTE — Preoperative (Signed)
Beta Blockers   Reason not to administer Beta Blockers:Not Applicable 

## 2011-08-13 NOTE — Transfer of Care (Signed)
Immediate Anesthesia Transfer of Care Note  Patient: Dawn Salas  Procedure(s) Performed: Procedure(s) (LRB): CRANIOTOMY TUMOR EXCISION (Bilateral)  Patient Location: PACU  Anesthesia Type: General  Level of Consciousness: awake, alert  and oriented  Airway & Oxygen Therapy: Patient Spontanous Breathing and Patient connected to nasal cannula oxygen  Post-op Assessment: Report given to PACU RN and Post -op Vital signs reviewed and stable  Post vital signs: Reviewed and stable  Complications: No apparent anesthesia complications

## 2011-08-13 NOTE — Progress Notes (Signed)
A-line and BP cuff not correlating. Per Dr. Lovell Sheehan go by cuff to keep Systolic < 160.  Cyndie Chime [place]

## 2011-08-13 NOTE — H&P (Signed)
Subjective: The patient is a 71 year old black female who has a history of multiple intracranial meningiomas. I previously resected one of these lesions. We have been following the other lesions radiographically. The interhemispheric frontal meningioma seems to have enlarged. I discussed the situation with the patient. We discussed the various treatment options including surgery. She has weighed the risks, benefits, and alternatives surgery and decided proceed with the operation.   Past Medical History  Diagnosis Date  . Allergy   . Asthma   . Hyperlipidemia   . Hypertension   . Depression   . GERD (gastroesophageal reflux disease)   . Peptic ulcer disease   . Palpitations   . Multiple meningiomas of spine and brain   . Hyperglycemia   . Obstructive sleep apnea     STUDY AT [facility] DOES NOT USE C PAP  . Diabetes mellitus     TYPE ONE  . Hyperthyroidism     PT HAD BIPOSY -NEGATIVE.Marland Kitchen AND NOW JUST GETS CHECKED EACH YEAR .Marland Kitchen 2013 LAST TEST   SEES DR. [name].   Marland Kitchen Arthritis     Past Surgical History  Procedure Date  . Abdominal hysterectomy   . Tonsillectomy   . Tubal ligation   . Right knee arthroscopy     x2  . Right foot bunectomy   . Right shoulder rotator cuff   . L4-l5 posterior la   . C5-c6 neck fusion   . Left foot plantar and hammertoe   . Brain meningioma excision 8/10  . Back surgery     L SPIN 2003   NECK 2004  . Dilation and curettage of uterus     YEARS AGO    Allergies  Allergen Reactions  . Flagyl (Metronidazole) Other (See Comments)    Caused increased heart rate  . Aspirin Effervescent Swelling    Alka-Seltzer gel caps- sore mouth, face swollen, turned hands white  . Morphine Sulfate     REACTION: hives  . Penicillins     REACTION: rash    History  Substance Use Topics  . Smoking status: Never Smoker   . Smokeless tobacco: Not on file  . Alcohol Use: No    Family History  Problem Relation Age of Onset  . Cancer Mother     ovarian  . Heart  disease Father 43  . Arthritis Other   . Hyperlipidemia Other   . Hypertension Other   . Diabetes Other   . Anesthesia problems Daughter     NAUSEA AND VOMITING POST OP   Prior to Admission medications   Medication Sig Start Date End Date Taking? Authorizing Provider  albuterol (PROVENTIL,VENTOLIN) 90 MCG/ACT inhaler Inhale 2 puffs into the lungs every 6 (six) hours as needed. For shortness of breath   Yes Historical Provider, MD  cetirizine (ZYRTEC) 10 MG tablet Take 10 mg by mouth daily as needed. For allergies   Yes Historical Provider, MD  Colesevelam HCl [facility]) 3.75 G PACK Take 1 each by mouth daily.   Yes Historical Provider, MD  diclofenac sodium (VOLTAREN) 1 % GEL Apply 1 application topically 2 (two) times daily.   Yes Historical Provider, MD  fluticasone (FLONASE) 50 MCG/ACT nasal spray Place 2 sprays into the nose daily as needed. For allergies, stuffy head   Yes Historical Provider, MD  furosemide (LASIX) 40 MG tablet Take 20 mg by mouth daily as needed. For fluid   Yes Historical Provider, MD  Iron-Vitamins (GERITOL COMPLETE PO) Take 1 tablet by mouth daily.  Yes Historical Provider, MD  losartan-hydrochlorothiazide (HYZAAR) 100-12.5 MG per tablet Take 1 tablet by mouth daily. 02/15/11  Yes Kristian Covey, MD  magnesium oxide (MAG-OX) 400 (241.3 MG) MG tablet Take 1,200 mg by mouth daily.   Yes Historical Provider, MD  meclizine (ANTIVERT) 25 MG tablet Take 25 mg by mouth every 6 (six) hours as needed. For vertigo   Yes Historical Provider, MD  metFORMIN (GLUCOPHAGE) 500 MG tablet Take 1 tablet (500 mg total) by mouth 2 (two) times daily with a meal. 05/09/11 05/08/12 Yes Kristian Covey, MD  metoprolol succinate (TOPROL XL) 100 MG 24 hr tablet Take 1 tablet (100 mg total) by mouth daily. 02/15/11  Yes Kristian Covey, MD  nabumetone (RELAFEN) 500 MG tablet Take 500 mg by mouth daily as needed. For Arthritis pain   Yes Historical Provider, MD  ONE TOUCH LANCETS MISC Check  BS twice daily   Yes Historical Provider, MD  OVER THE COUNTER MEDICATION Take 2 tablets by mouth daily. Arthritis pain   Yes Historical Provider, MD  OVER THE COUNTER MEDICATION Take 5 mLs by mouth daily.   Yes Historical Provider, MD  OVER THE COUNTER MEDICATION Take 10 mLs by mouth daily.   Yes Historical Provider, MD  Pitavastatin Calcium (LIVALO) 1 MG TABS Take 1 tablet by mouth every 3 (three) days.   Yes Historical Provider, MD  potassium chloride (K-DUR) 10 MEQ tablet Take 10 mEq by mouth daily. May take when pt takes Lasix prn 10/11/10 10/11/11 Yes Kristian Covey, MD  RABEprazole (ACIPHEX) 20 MG tablet Take 1 tablet (20 mg total) by mouth daily. 02/15/11  Yes Kristian Covey, MD  solifenacin (VESICARE) 5 MG tablet Take 1 tablet (5 mg total) by mouth daily. 06/29/11  Yes Kristian Covey, MD  diazepam (VALIUM) 5 MG tablet Take 2.5 mg by mouth daily as needed. Takes when she is out of Meclizine    Historical Provider, MD  valACYclovir (VALTREX) 1000 MG tablet Take 1,000 mg by mouth 2 (two) times daily as needed. For mouth blisters, and nose blisters    Historical Provider, MD     Review of Systems  Positive ROS: As above  All other systems have been reviewed and were otherwise negative with the exception of those mentioned in the HPI and as above.  Objective: Vital signs in last 24 hours: Temp:  [97.1 F (36.2 C)] 97.1 F (36.2 C) (07/22 0701) Pulse Rate:  [72] 72  (07/22 0701) Resp:  [18] 18  (07/22 0701) BP: (115)/(87) 115/87 mmHg (07/22 0701) SpO2:  [99 %] 99 % (07/22 0701)  General Appearance: Alert, cooperative, no distress, appears stated age Head: Normocephalic, without obvious abnormality, atraumatic. The patient's craniotomy incision is well-healed. Eyes: PERRL, conjunctiva/corneas clear, EOM's intact, fundi benign, both eyes      Ears: Normal TM's and external ear canals, both ears Throat: Lips, mucosa, and tongue normal; teeth and gums normal Neck: Supple,  symmetrical, trachea midline, no adenopathy; thyroid: No enlargement/tenderness/nodules; no carotid bruit or JVD Back: Symmetric, no curvature, ROM normal, no CVA tenderness Lungs: Clear to auscultation bilaterally, respirations unlabored Heart: Regular rate and rhythm, S1 and S2 normal, no murmur, rub or gallop Abdomen: Soft, non-tender, bowel sounds active all four quadrants, no masses, no organomegaly Extremities: Extremities normal, atraumatic, no cyanosis or edema Pulses: 2+ and symmetric all extremities Skin: Skin color, texture, turgor normal, no rashes or lesions  NEUROLOGIC:   Mental status: alert and oriented, no aphasia, good  attention span, Fund of knowledge/ memory ok Motor Exam - grossly normal Sensory Exam - grossly normal Reflexes: Symmetric Coordination - grossly normal Gait - grossly normal Balance - grossly normal Cranial Nerves: I: smell Not tested  II: visual acuity  OS: Normal    OD: Normal   II: visual fields Full to confrontation  II: pupils Equal, round, reactive to light  III,VII: ptosis None  III,IV,VI: extraocular muscles  Full ROM  V: mastication Normal  V: facial light touch sensation  Normal  V,VII: corneal reflex  Present  VII: facial muscle function - upper  Normal  VII: facial muscle function - lower Normal  VIII: hearing Not tested  IX: soft palate elevation  Normal  IX,X: gag reflex Present  XI: trapezius strength  5/5  XI: sternocleidomastoid strength 5/5  XI: neck flexion strength  5/5  XII: tongue strength  Normal    Data Review Lab Results  Component Value Date   WBC 3.3* 08/06/2011   HGB 12.2 08/06/2011   HCT 37.4 08/06/2011   MCV 78.4 08/06/2011   PLT 174 08/06/2011   Lab Results  Component Value Date   NA 139 08/06/2011   K 4.6 08/06/2011   CL 100 08/06/2011   CO2 27 08/06/2011   BUN 15 08/06/2011   CREATININE 1.10 08/06/2011   GLUCOSE 135* 08/06/2011   Lab Results  Component Value Date   INR 0.95 06/09/2010     Assessment/Plan: Multiple intracranial meningiomas: I discussed the situation with the patient. I reviewed her MR scan with her and pointed out the abnormalities. We have discussed the various treatment options including continued observation, radiosurgery, and surgery. I described the surgical option of a bifrontal craniotomy to resect intra hemispheric lesion. We discussed the risks, benefits, alternatives, and likelihood of achieving our goals with surgery. I've answered all the patient's questions. She wants to proceed with the operation.   Hester Joslin D 08/13/2011 9:18 AM

## 2011-08-14 ENCOUNTER — Encounter (HOSPITAL_COMMUNITY): Payer: Self-pay | Admitting: Neurosurgery

## 2011-08-14 LAB — GLUCOSE, CAPILLARY
Glucose-Capillary: 121 mg/dL — ABNORMAL HIGH (ref 70–99)
Glucose-Capillary: 121 mg/dL — ABNORMAL HIGH (ref 70–99)
Glucose-Capillary: 124 mg/dL — ABNORMAL HIGH (ref 70–99)
Glucose-Capillary: 150 mg/dL — ABNORMAL HIGH (ref 70–99)
Glucose-Capillary: 152 mg/dL — ABNORMAL HIGH (ref 70–99)
Glucose-Capillary: 157 mg/dL — ABNORMAL HIGH (ref 70–99)

## 2011-08-14 MED ORDER — LABETALOL HCL 5 MG/ML IV SOLN: 10.0000 mg | INTRAVENOUS | Status: DC | PRN

## 2011-08-14 NOTE — Progress Notes (Signed)
Patient ID: Dawn Salas, female   DOB: 1940/02/04, 71 y.o.   MRN: [id number] Subjective:  The patient is alert and pleasant. She looks well. She has no complaints.  Objective: Vital signs in last 24 hours: Temp:  [97.5 F (36.4 C)-98.9 F (37.2 C)] 98.1 F (36.7 C) (07/23 0400) Pulse Rate:  [73-100] 95  (07/23 0700) Resp:  [11-27] 18  (07/23 0700) BP: (92-177)/(56-89) 149/64 mmHg (07/23 0700) SpO2:  [97 %-100 %] 100 % (07/23 0700) Arterial Line BP: (135-213)/(56-91) 163/64 mmHg (07/23 0700) Weight:  [101.8 kg (224 lb 6.9 oz)] 101.8 kg (224 lb 6.9 oz) (07/22 1615)  Intake/Output from previous day: 07/22 0701 - 07/23 0700 In: 3866.3 [I.V.:3761.3; IV Piggyback:105] Out: 2748 [Urine:1837; Drains:586; Blood:325] Intake/Output this shift:    Physical exam patient is alert and oriented x3. Her strength is normal. Her speech is normal. Her pupils are equal. Her dressing is clean and dry.  Lab Results: No results found for this basename: WBC:2,HGB:2,HCT:2,PLT:2 in the last 72 hours BMET No results found for this basename: NA:2,K:2,CL:2,CO2:2,GLUCOSE:2,BUN:2,CREATININE:2,CALCIUM:2 in the last 72 hours  Studies/Results: No results found.  Assessment/Plan: Postop day #1: The patient is doing well. We will mobilize her and remove the Foley/arterial line.  LOS: 1 day     Benedicto Capozzi D 08/14/2011, 7:23 AM

## 2011-08-14 NOTE — Progress Notes (Signed)
UR COMPLETED  

## 2011-08-14 NOTE — Evaluation (Signed)
Physical Therapy Evaluation Patient Details Name: Dawn Salas MRN: [id number] DOB: 15-Feb-1940 Today's Date: 08/14/2011 Time: 4098-1191 PT Time Calculation (min): 33 min  PT Assessment / Plan / Recommendation Clinical Impression  Patient presents S/P bifrontal craniotomy for resection of intracranial meningioma. She will benefit from PT in the acute setting to ensure she returns home indep. with her functional mobility.     PT Assessment  Patient needs continued PT services    Follow Up Recommendations  No PT follow up    Barriers to Discharge  None      Equipment Recommendations  None recommended by PT    Recommendations for Other Services  None   Frequency Min 4X/week    Precautions / Restrictions Precautions Precautions: None   Pertinent Vitals/Pain VSS. Pain of head 3/10      Mobility  Bed Mobility Bed Mobility: Supine to Sit;Sitting - Scoot to Edge of Bed Supine to Sit: 5: Supervision;HOB elevated Sitting - Scoot to Edge of Bed: 5: Supervision Details for Bed Mobility Assistance: Increased time for task completion only.  Transfers Transfers: Sit to Stand;Stand to Sit Sit to Stand: 5: Supervision;With upper extremity assist;From bed;From toilet Stand to Sit: To toilet;To chair/3-in-1;5: Supervision Details for Transfer Assistance: Patient with good initiation of standing and able to control sit to chair without UE use. Ambulation/Gait Ambulation/Gait Assistance: 5: Supervision Ambulation Distance (Feet): 180 Feet Assistive device: None Gait Pattern: Within Functional Limits     PT Diagnosis: Difficulty walking;Acute pain  PT Problem List: Decreased mobility;Pain;Decreased activity tolerance PT Treatment Interventions: Gait training;Stair training;Patient/family education;Therapeutic activities   PT Goals Acute Rehab PT Goals PT Goal Formulation: With patient Time For Goal Achievement: 08/21/11 Potential to Achieve Goals: Good Pt will go Supine/Side to  Sit: with modified independence PT Goal: Supine/Side to Sit - Progress: Goal set today Pt will go Sit to Supine/Side: with modified independence PT Goal: Sit to Supine/Side - Progress: Goal set today Pt will go Sit to Stand: with modified independence PT Goal: Sit to Stand - Progress: Goal set today Pt will go Stand to Sit: with modified independence PT Goal: Stand to Sit - Progress: Goal set today Pt will Ambulate: >150 feet;with modified independence PT Goal: Ambulate - Progress: Goal set today Pt will Go Up / Down Stairs: Flight;with rail(s);with modified independence PT Goal: Up/Down Stairs - Progress: Goal set today  Visit Information  Last PT Received On: 08/14/11 Assistance Needed: +1    Subjective Data  Subjective: 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 States arthritis of knee so likes to keep active to prevent stiffness. Communication Communication: No difficulties    Cognition  Overall Cognitive Status: Appears within functional limits for tasks assessed/performed (Does report trouble word finding PTA) Arousal/Alertness: Awake/alert Orientation Level: Appears intact for tasks assessed Behavior During Session: [facility] for tasks performed    Extremity/Trunk Assessment Right Lower Extremity Assessment RLE ROM/Strength/Tone:  Within functional levels RLE Sensation:  WFL - Light Touch;WFL - Proprioception RLE Coordination: WFL - gross/fine motor Left Lower Extremity Assessment LLE ROM/Strength/Tone: Within functional levels LLE Sensation: WFL - Light Touch;WFL - Proprioception LLE Coordination: WFL - gross/fine motor Trunk Assessment Trunk Assessment: Normal   Balance High Level Balance High Level Balance Comments: No evidence of imbalance with gait  End of Session PT - End of Session Equipment Utilized During Treatment: Gait belt Activity Tolerance: Patient tolerated treatment well Patient left: in chair;with call bell/phone within reach Nurse Communication: Mobility status   Edwyna Perfect, PT  Pager [phone]  08/14/2011, 8:20 AM

## 2011-08-15 LAB — GLUCOSE, CAPILLARY
Glucose-Capillary: 122 mg/dL — ABNORMAL HIGH (ref 70–99)
Glucose-Capillary: 155 mg/dL — ABNORMAL HIGH (ref 70–99)
Glucose-Capillary: 119 mg/dL — ABNORMAL HIGH (ref 70–99)
Glucose-Capillary: 138 mg/dL — ABNORMAL HIGH (ref 70–99)
Glucose-Capillary: 123 mg/dL — ABNORMAL HIGH (ref 70–99)

## 2011-08-15 MED ORDER — PANTOPRAZOLE SODIUM 40 MG PO TBEC
40.0000 mg | DELAYED_RELEASE_TABLET | Freq: Every day | ORAL | Status: DC
  Administered 2011-08-15 – 2011-08-16 (×2): 40 mg via ORAL
  Filled 2011-08-15 (×2): qty 1

## 2011-08-15 MED FILL — Insulin Aspart Inj 100 Unit/ML: SUBCUTANEOUS | Qty: 0.04 | Status: AC

## 2011-08-15 MED FILL — Insulin Aspart Inj 100 Unit/ML: SUBCUTANEOUS | Qty: 0.07 | Status: AC

## 2011-08-15 MED FILL — Insulin Aspart Inj 100 Unit/ML: SUBCUTANEOUS | Qty: 0.03 | Status: AC

## 2011-08-15 NOTE — Evaluation (Signed)
Occupational Therapy Evaluation and Discharge  Patient Details Name: Dawn Salas MRN: [id number] DOB: Jun 14, 1940 Today's Date: 08/15/2011 Time: 4098-1191 OT Time Calculation (min): 37 min  OT Assessment / Plan / Recommendation Clinical Impression  Pt s/p frontal craniotomy and presents at baseline level of functioning. Pt with no acute OT needs indicated at this time- signing off.    OT Assessment  Patient does not need any further OT services    Follow Up Recommendations  No OT follow up    Barriers to Discharge      Equipment Recommendations  None recommended by OT;None recommended by PT    Recommendations for Other Services    Frequency       Precautions / Restrictions Precautions Precautions: None   Pertinent Vitals/Pain Pt reports "some discomfort" on the left side of her head, but denies "pain"     ADL  Grooming: Performed;Independent Where Assessed - Grooming: Unsupported standing Upper Body Bathing: Performed;Set up Where Assessed - Upper Body Bathing: Unsupported sitting Lower Body Bathing: Performed;Set up Where Assessed - Lower Body Bathing: Unsupported sit to stand Upper Body Dressing: Performed;Set up Where Assessed - Upper Body Dressing: Unsupported sit to stand Lower Body Dressing: Performed;Set up Where Assessed - Lower Body Dressing: Unsupported sit to stand Toilet Transfer: Performed;Independent Toilet Transfer Method: Sit to Barista: Regular height toilet;Grab bars Toileting - Clothing Manipulation and Hygiene: Performed;Independent Where Assessed - Toileting Clothing Manipulation and Hygiene: Standing Tub/Shower Transfer: Simulated;Modified independent Tub/Shower Transfer Method: Ambulating (hold to shower door frame) Tub/Shower Transfer Equipment: Shower seat with back;Walk in shower Transfers/Ambulation Related to ADLs: Pt I with ambulation in room; pt with one slight LOB to the left when turning- able to  self-correct ADL Comments: pt at baseline functioning    OT Diagnosis:    OT Problem List:   OT Treatment Interventions:     OT Goals    Visit Information  Last OT Received On: 08/15/11 Assistance Needed: +1    Subjective Data  Subjective: I would love to get washed up Patient Stated Goal: Return home with family   Prior Functioning  Vision/Perception  Home Living Lives With: Family Available Help at Discharge: Family;Available PRN/intermittently Type of Home: House Home Access: Stairs to enter [employer] of Steps: 2 Entrance Stairs-Rails: None Home Layout: Two level Alternate Level Stairs-Number of Steps: 13 Alternate Level Stairs-Rails: Left Bathroom Shower/Tub: Walk-in shower;Door Bathroom Toilet: Handicapped height Home Adaptive Equipment: Straight cane;Walker - rolling Additional Comments: sink beside toilet Prior Function Level of Independence: Independent Able to Take Stairs?: Yes Driving: Yes Vocation: Part time employment 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 States arthritis of knee so likes to keep active to prevent stiffness. Communication Communication: No difficulties Dominant Hand: Right      Cognition  Overall Cognitive Status: Appears within functional limits for tasks assessed/performed Arousal/Alertness: Awake/alert Orientation Level: Appears intact for tasks assessed Behavior During Session: [facility] for tasks performed    Extremity/Trunk Assessment Right Upper Extremity Assessment RUE ROM/Strength/Tone: Within functional levels RUE Sensation: WFL - Light Touch RUE Coordination: WFL - gross/fine motor Left Upper Extremity Assessment LUE ROM/Strength/Tone: Within functional levels LUE Sensation: WFL - Light Touch LUE Coordination: WFL - gross/fine motor   Mobility Bed Mobility Supine to Sit: 7: Independent;HOB flat Sitting - Scoot to Edge of Bed: 7: Independent Transfers Sit to Stand: 7: Independent;From bed Stand to  Sit: 7: Independent;To bed   Exercise    Balance    End of  Session OT - End of Session Equipment Utilized During Treatment: Gait belt Activity Tolerance: Patient tolerated treatment well Patient left: in bed;with call bell/phone within reach;with bed alarm set  GO     Tymira Horkey 08/15/2011, 3:10 PM

## 2011-08-15 NOTE — Progress Notes (Signed)
Patient ID: Dawn Salas, female   DOB: 1941-01-01, 71 y.o.   MRN: [id number] Subjective:  The patient is alert and pleasant. She looks and feels well. She wants to move to the floor.  Objective: Vital signs in last 24 hours: Temp:  [98.1 F (36.7 C)-99 F (37.2 C)] 98.2 F (36.8 C) (07/24 0400) Pulse Rate:  [34-109] 82  (07/24 0800) Resp:  [13-24] 16  (07/24 0800) BP: (110-153)/(46-72) 131/58 mmHg (07/24 0800) SpO2:  [90 %-100 %] 100 % (07/24 0800)  Intake/Output from previous day: 07/23 0701 - 07/24 0700 In: 3675 [P.O.:1315; I.V.:1650; IV Piggyback:710] Out: 3615 [Urine:3330; Drains:285] Intake/Output this shift: Total I/O In: 75 [I.V.:75] Out: 700 [Urine:700]  Physical exam the patient is alert and oriented x3 her speech and strength is normal. Her dressing is clean and dry.  Lab Results: No results found for this basename: WBC:2,HGB:2,HCT:2,PLT:2 in the last 72 hours BMET No results found for this basename: NA:2,K:2,CL:2,CO2:2,GLUCOSE:2,BUN:2,CREATININE:2,CALCIUM:2 in the last 72 hours  Studies/Results: No results found.  Assessment/Plan: Postop day #2: The patient is doing well. I will transfer to the floor. We will remove her drain.  LOS: 2 days     Kevis Qu D 08/15/2011, 8:37 AM

## 2011-08-15 NOTE — Progress Notes (Signed)
Physical Therapy Treatment Patient Details Name: Dawn Salas MRN: [id number] DOB: [birth year] Today's Date: 08/15/2011 Time: 1191-4782 PT Time Calculation (min): 20 min  PT Assessment / Plan / Recommendation Comments on Treatment Session  Patient is making excellent progress with her mobility. We will follow for stairs tomorrow and will likely discharge patient at that time from acute PT services.     Follow Up Recommendations  No PT follow up    Barriers to Discharge  None      Equipment Recommendations  None recommended by PT    Recommendations for Other Services  None  Frequency Min 4X/week   Plan Discharge plan remains appropriate;Frequency remains appropriate    Precautions / Restrictions Precautions Precautions: None   Pertinent Vitals/Pain VSS/ No pain    Mobility  Bed Mobility Bed Mobility: Supine to Sit;Sitting - Scoot to Edge of Bed Supine to Sit: 6: Modified independent (Device/Increase time) Sitting - Scoot to Edge of Bed: 6: Modified independent (Device/Increase time) Transfers Sit to Stand: 6: Modified independent (Device/Increase time);From bed Stand to Sit: 6: Modified independent (Device/Increase time);To chair/3-in-1 Ambulation/Gait Ambulation/Gait Assistance: 5: Supervision Ambulation Distance (Feet): 320 Feet Assistive device: None Ambulation/Gait Assistance Details: Speed at least 2.0 feet/second conssitently - not indicative of recurrent fall risk.  Gait Pattern: Within Functional Limits     PT Goals Acute Rehab PT Goals PT Goal: Supine/Side to Sit - Progress: Met PT Goal: Sit to Stand - Progress: Met PT Goal: Stand to Sit - Progress: Met PT Goal: Ambulate - Progress: Progressing toward goal  Visit Information  Last PT Received On: 08/15/11 Assistance Needed: +1    Subjective Data  Subjective: Patient reports that she ambulate din hallway with nursing yesterday. Patient Stated Goal: Home   Cognition  Overall Cognitive Status: Appears  within functional limits for tasks assessed/performed Arousal/Alertness: Awake/alert Orientation Level: Appears intact for tasks assessed Behavior During Session: [facility] for tasks performed    Balance  High Level Balance High Level Balance Activites: Direction changes;Turns High Level Balance Comments: no evidence of imbalance with gait  End of Session PT - End of Session Equipment Utilized During Treatment: Gait belt Activity Tolerance: Patient tolerated treatment well Patient left: in chair;with call bell/phone within reach Nurse Communication: Mobility status    Edwyna Perfect, PT  Pager [phone]  08/15/2011, 9:06 AM

## 2011-08-16 ENCOUNTER — Ambulatory Visit: Payer: Medicare Other | Admitting: Family Medicine

## 2011-08-16 LAB — GLUCOSE, CAPILLARY
Glucose-Capillary: 126 mg/dL — ABNORMAL HIGH (ref 70–99)
Glucose-Capillary: 99 mg/dL (ref 70–99)
Glucose-Capillary: 95 mg/dL (ref 70–99)
Glucose-Capillary: 90 mg/dL (ref 70–99)
Glucose-Capillary: 111 mg/dL — ABNORMAL HIGH (ref 70–99)

## 2011-08-16 NOTE — Progress Notes (Signed)
Patient ID: Dawn Salas, female   DOB: June 24, 1940, 71 y.o.   MRN: [id number] Subjective:  The patient is alert and pleasant. She looks well. She wants to go home tomorrow.  Objective: Vital signs in last 24 hours: Temp:  [98.4 F (36.9 C)-98.7 F (37.1 C)] 98.7 F (37.1 C) (07/25 7829) Pulse Rate:  [81-88] 85  (07/25 0638) Resp:  [18-20] 20  (07/25 0638) BP: (115-136)/(55-71) 122/65 mmHg (07/25 0638) SpO2:  [95 %-100 %] 100 % (07/25 5621)  Intake/Output from previous day: 07/24 0701 - 07/25 0700 In: 255 [I.V.:150; IV Piggyback:105] Out: 825 [Urine:700; Drains:125] Intake/Output this shift:    Physical exam the patient is alert and oriented x3. Her speech is normal. Her strength is normal. Her pupils are equal. The patient's wound is healing well without signs of infection. I removed the patient's drain today.  Lab Results: No results found for this basename: WBC:2,HGB:2,HCT:2,PLT:2 in the last 72 hours BMET No results found for this basename: NA:2,K:2,CL:2,CO2:2,GLUCOSE:2,BUN:2,CREATININE:2,CALCIUM:2 in the last 72 hours  Studies/Results: No results found.  Assessment/Plan: Postop day #3: The patient is doing well. We will plan to send her home tomorrow. She will work with PT and OT again today.  LOS: 3 days     Dawn Salas D 08/16/2011, 1:25 PM

## 2011-08-16 NOTE — Progress Notes (Signed)
Pt jp drained removed by MD. Dressing applied.  Pt under no s/s distress.

## 2011-08-16 NOTE — Plan of Care (Signed)
Problem: Consults Goal: Diagnosis - Craniotomy Tumor     

## 2011-08-17 LAB — GLUCOSE, CAPILLARY: Glucose-Capillary: 113 mg/dL — ABNORMAL HIGH (ref 70–99)

## 2011-08-17 MED ORDER — OXYCODONE-ACETAMINOPHEN 10-325 MG PO TABS: 1.0000 | ORAL_TABLET | ORAL | Status: DC | PRN

## 2011-08-17 MED ORDER — DIAZEPAM 5 MG PO TABS: 2.5000 mg | ORAL_TABLET | Freq: Every day | ORAL | Status: DC | PRN

## 2011-08-17 MED ORDER — DSS 100 MG PO CAPS: 100.0000 mg | ORAL_CAPSULE | Freq: Two times a day (BID) | ORAL | Status: DC

## 2011-08-17 NOTE — Care Management Note (Signed)
    Page 1 of 1   08/17/2011     11:46:44 AM   CARE MANAGEMENT NOTE 08/17/2011  Patient:  Dawn Salas, Dawn Salas   Account Number:  [id number]  Date Initiated:  08/15/2011  Documentation initiated by:  [facility]  Subjective/Objective Assessment:   Admitted postop bifrontal craniotomy for tumor     Action/Plan:   PT eval   Anticipated DC Date:  08/17/2011   Anticipated DC Plan:  HOME/SELF CARE      DC Planning Services  CM consult      Choice offered to / List presented to:             Status of service:  Completed, signed off Medicare Important Message given?   (If response is "NO", the following Medicare IM given date fields will be blank) Date Medicare IM given:   Date Additional Medicare IM given:    Discharge Disposition:  HOME/SELF CARE  Per UR Regulation:  Reviewed for med. necessity/level of care/duration of stay  If discussed at Long Length of Stay Meetings, dates discussed:    Comments:

## 2011-08-17 NOTE — Discharge Summary (Signed)
Physician Discharge Summary  Patient ID: Dawn Salas MRN: [id number] DOB/AGE: 69-Jan-1942 69 y.o.  Admit date: 08/13/2011 Discharge date: 08/17/2011  Admission Diagnoses: Multiple intracranial meningiomas  Discharge Diagnoses: The same Principal Problem:  *Meningioma   Discharged Condition: good  Hospital Course: I admitted the patient to [facility] on 08/11/2011. On that day I performed a bifrontal craniotomy for resection of 2 of the meningiomas. The surgery were well.  The patient's postoperative course was unremarkable and she was requesting discharge home on 08/17/2011. The patient was given oral and written discharge instructions. All her questions were answered.  Consults: None Significant Diagnostic Studies: None Treatments: Bifrontal craniotomy for gross total resection of 2 intracranial meningiomas  Discharge Exam: Blood pressure 111/58, pulse 85, temperature 98.9 F (37.2 C), temperature source Oral, resp. rate 18, height 5\' 7"  (1.702 m), weight 101.8 kg (224 lb 6.9 oz), SpO2 100.00%. The patient is alert and oriented. Her speech is normal her strength is normal. Her wound is healing well.  Disposition: Home  Discharge Orders    Future Orders Please Complete By Expires   Diet - low sodium heart healthy      Increase activity slowly      Discharge instructions      Comments:   Call [phone] for a followup appointment.   Remove dressing in 48 hours      Call MD for:  temperature >100.4      Call MD for:  persistant nausea and vomiting      Call MD for:  severe uncontrolled pain      Call MD for:  redness, tenderness, or signs of infection (pain, swelling, redness, odor or green/yellow discharge around incision site)      Call MD for:  difficulty breathing, headache or visual disturbances      Call MD for:  hives      Call MD for:  persistant dizziness or light-headedness      Call MD for:  extreme fatigue        Medication List  As of 08/17/2011   8:11 AM   TAKE these medications         albuterol 90 MCG/ACT inhaler   Commonly known as: PROVENTIL,VENTOLIN   Inhale 2 puffs into the lungs every 6 (six) hours as needed. For shortness of breath      cetirizine 10 MG tablet   Commonly known as: ZYRTEC   Take 10 mg by mouth daily as needed. For allergies      diazepam 5 MG tablet   Commonly known as: VALIUM   Take 2.5 mg by mouth daily as needed. Takes when she is out of Meclizine      diazepam 5 MG tablet   Commonly known as: VALIUM   Take 0.5 tablets (2.5 mg total) by mouth daily as needed for anxiety.      diclofenac sodium 1 % Gel   Commonly known as: VOLTAREN   Apply 1 application topically 2 (two) times daily.      DSS 100 MG Caps   Take 100 mg by mouth 2 (two) times daily.      fluticasone 50 MCG/ACT nasal spray   Commonly known as: FLONASE   Place 2 sprays into the nose daily as needed. For allergies, stuffy head      furosemide 40 MG tablet   Commonly known as: LASIX   Take 20 mg by mouth daily as needed. For fluid      GERITOL COMPLETE  PO   Take 1 tablet by mouth daily.      LIVALO 1 MG Tabs   Generic drug: Pitavastatin Calcium   Take 1 tablet by mouth every 3 (three) days.      losartan-hydrochlorothiazide 100-12.5 MG per tablet   Commonly known as: HYZAAR   Take 1 tablet by mouth daily.      magnesium oxide 400 (241.3 MG) MG tablet   Commonly known as: MAG-OX   Take 1,200 mg by mouth daily.      meclizine 25 MG tablet   Commonly known as: ANTIVERT   Take 25 mg by mouth every 6 (six) hours as needed. For vertigo      metFORMIN 500 MG tablet   Commonly known as: GLUCOPHAGE   Take 1 tablet (500 mg total) by mouth 2 (two) times daily with a meal.      metoprolol succinate 100 MG 24 hr tablet   Commonly known as: TOPROL-XL   Take 1 tablet (100 mg total) by mouth daily.      nabumetone 500 MG tablet   Commonly known as: RELAFEN   Take 500 mg by mouth daily as needed. For Arthritis pain      ONE  TOUCH LANCETS Misc   Check BS twice daily      OVER THE COUNTER MEDICATION   Take 2 tablets by mouth daily. Arthritis pain      OVER THE COUNTER MEDICATION   Take 5 mLs by mouth daily.      OVER THE COUNTER MEDICATION   Take 10 mLs by mouth daily.      oxyCODONE-acetaminophen 10-325 MG per tablet   Commonly known as: PERCOCET   Take 1 tablet by mouth every 4 (four) hours as needed for pain.      potassium chloride 10 MEQ tablet   Commonly known as: K-DUR   Take 10 mEq by mouth daily. May take when pt takes Lasix prn      RABEprazole 20 MG tablet   Commonly known as: ACIPHEX   Take 1 tablet (20 mg total) by mouth daily.      solifenacin 5 MG tablet   Commonly known as: VESICARE   Take 1 tablet (5 mg total) by mouth daily.      valACYclovir 1000 MG tablet   Commonly known as: VALTREX   Take 1,000 mg by mouth 2 (two) times daily as needed. For mouth blisters, and nose blisters      WELCHOL 3.75 G Pack   Generic drug: Colesevelam HCl   Take 1 each by mouth daily.             SignedTressie Stalker D 08/17/2011, 8:11 AM

## 2011-08-17 NOTE — Progress Notes (Signed)
Pt dc instructions provided with prescriptions. Pt verbalized understanding. Iv dc intact. Pt old dressing removed and new dressing applied with tape. Pt under no s/s distress.

## 2011-08-19 ENCOUNTER — Emergency Department (HOSPITAL_COMMUNITY): Payer: Medicare Other

## 2011-08-19 ENCOUNTER — Encounter (HOSPITAL_COMMUNITY): Payer: Self-pay | Admitting: Emergency Medicine

## 2011-08-19 ENCOUNTER — Inpatient Hospital Stay (HOSPITAL_COMMUNITY)
Admission: EM | Admit: 2011-08-19 | Discharge: 2011-08-22 | DRG: 101 | Disposition: A | Discharge status: Home / Self Care | Payer: Medicare Other | Attending: Neurosurgery | Admitting: Neurosurgery

## 2011-08-19 DIAGNOSIS — D329 Benign neoplasm of meninges, unspecified: Secondary | ICD-10-CM

## 2011-08-19 DIAGNOSIS — E0590 Thyrotoxicosis, unspecified without thyrotoxic crisis or storm: Secondary | ICD-10-CM | POA: Diagnosis present

## 2011-08-19 DIAGNOSIS — G4733 Obstructive sleep apnea (adult) (pediatric): Secondary | ICD-10-CM | POA: Diagnosis present

## 2011-08-19 DIAGNOSIS — J45909 Unspecified asthma, uncomplicated: Secondary | ICD-10-CM | POA: Diagnosis present

## 2011-08-19 DIAGNOSIS — G40109 Localization-related (focal) (partial) symptomatic epilepsy and epileptic syndromes with simple partial seizures, not intractable, without status epilepticus: Principal | ICD-10-CM | POA: Diagnosis present

## 2011-08-19 DIAGNOSIS — E785 Hyperlipidemia, unspecified: Secondary | ICD-10-CM | POA: Diagnosis present

## 2011-08-19 DIAGNOSIS — E876 Hypokalemia: Secondary | ICD-10-CM | POA: Diagnosis present

## 2011-08-19 DIAGNOSIS — E119 Type 2 diabetes mellitus without complications: Secondary | ICD-10-CM | POA: Diagnosis present

## 2011-08-19 DIAGNOSIS — I10 Essential (primary) hypertension: Secondary | ICD-10-CM

## 2011-08-19 DIAGNOSIS — K219 Gastro-esophageal reflux disease without esophagitis: Secondary | ICD-10-CM

## 2011-08-19 DIAGNOSIS — R4182 Altered mental status, unspecified: Secondary | ICD-10-CM

## 2011-08-19 DIAGNOSIS — R4701 Aphasia: Secondary | ICD-10-CM | POA: Diagnosis present

## 2011-08-19 LAB — COMPREHENSIVE METABOLIC PANEL
ALT: 16 U/L (ref 0–35)
AST: 19 U/L (ref 0–37)
Albumin: 3.2 g/dL — ABNORMAL LOW (ref 3.5–5.2)
Alkaline Phosphatase: 74 U/L (ref 39–117)
BUN: 13 mg/dL (ref 6–23)
CO2: 28 mEq/L (ref 19–32)
Calcium: 8.5 mg/dL (ref 8.4–10.5)
Chloride: 98 mEq/L (ref 96–112)
Creatinine, Ser: 1.01 mg/dL (ref 0.50–1.10)
GFR calc Af Amer: 65 mL/min — ABNORMAL LOW (ref >90)
GFR calc non Af Amer: 56 mL/min — ABNORMAL LOW (ref >90)
Glucose, Bld: 155 mg/dL — ABNORMAL HIGH (ref 70–99)
Potassium: 3.2 mEq/L — ABNORMAL LOW (ref 3.5–5.1)
Sodium: 136 mEq/L (ref 135–145)
Total Bilirubin: 0.6 mg/dL (ref 0.3–1.2)
Total Protein: 6 g/dL (ref 6.0–8.3)

## 2011-08-19 LAB — CARDIAC PANEL(CRET KIN+CKTOT+MB+TROPI)
CK, MB: 1.4 ng/mL (ref 0.3–4.0)
Relative Index: INVALID (ref 0.0–2.5)
Total CK: 53 U/L (ref 7–177)
Troponin I: <0.3 ng/mL (ref <0.30)

## 2011-08-19 LAB — CBC WITH DIFFERENTIAL/PLATELET
Basophils Absolute: 0 10*3/uL (ref 0.0–0.1)
Basophils Relative: 0 % (ref 0–1)
Eosinophils Absolute: 0.1 10*3/uL (ref 0.0–0.7)
Eosinophils Relative: 2 % (ref 0–5)
HCT: 24.1 % — ABNORMAL LOW (ref 36.0–46.0)
Hemoglobin: 8.2 g/dL — ABNORMAL LOW (ref 12.0–15.0)
Lymphocytes Relative: 19 % (ref 12–46)
Lymphs Abs: 1.2 10*3/uL (ref 0.7–4.0)
MCH: 26.2 pg (ref 26.0–34.0)
MCHC: 34 g/dL (ref 30.0–36.0)
MCV: 77 fL — ABNORMAL LOW (ref 78.0–100.0)
Monocytes Absolute: 0.3 10*3/uL (ref 0.1–1.0)
Monocytes Relative: 5 % (ref 3–12)
Neutro Abs: 4.9 10*3/uL (ref 1.7–7.7)
Neutrophils Relative %: 74 % (ref 43–77)
Platelets: 215 10*3/uL (ref 150–400)
RBC: 3.13 MIL/uL — ABNORMAL LOW (ref 3.87–5.11)
RDW: 14.3 % (ref 11.5–15.5)
WBC: 6.6 10*3/uL (ref 4.0–10.5)

## 2011-08-19 LAB — URINALYSIS, ROUTINE W REFLEX MICROSCOPIC
Bilirubin Urine: NEGATIVE
Glucose, UA: NEGATIVE mg/dL
Hgb urine dipstick: NEGATIVE
Ketones, ur: NEGATIVE mg/dL
Leukocytes, UA: NEGATIVE
Nitrite: NEGATIVE
Protein, ur: NEGATIVE mg/dL
Specific Gravity, Urine: 1.007 (ref 1.005–1.030)
Urobilinogen, UA: 0.2 mg/dL (ref 0.0–1.0)
pH: 6.5 (ref 5.0–8.0)

## 2011-08-19 LAB — GLUCOSE, CAPILLARY
Glucose-Capillary: 93 mg/dL (ref 70–99)
Glucose-Capillary: 101 mg/dL — ABNORMAL HIGH (ref 70–99)
Glucose-Capillary: 129 mg/dL — ABNORMAL HIGH (ref 70–99)
Glucose-Capillary: 169 mg/dL — ABNORMAL HIGH (ref 70–99)
Glucose-Capillary: 181 mg/dL — ABNORMAL HIGH (ref 70–99)

## 2011-08-19 LAB — PROTIME-INR
INR: 1.01 (ref 0.00–1.49)
Prothrombin Time: 13.5 seconds (ref 11.6–15.2)

## 2011-08-19 LAB — OCCULT BLOOD, POC DEVICE: Fecal Occult Bld: NEGATIVE

## 2011-08-19 LAB — LACTIC ACID, PLASMA: Lactic Acid, Venous: 1 mmol/L (ref 0.5–2.2)

## 2011-08-19 LAB — AMMONIA: Ammonia: 18 umol/L (ref 11–60)

## 2011-08-19 MED ORDER — HYDROCHLOROTHIAZIDE 12.5 MG PO CAPS
12.5000 mg | ORAL_CAPSULE | Freq: Every day | ORAL | Status: DC
  Administered 2011-08-19 – 2011-08-22 (×3): 12.5 mg via ORAL
  Filled 2011-08-19 (×4): qty 1

## 2011-08-19 MED ORDER — POTASSIUM CHLORIDE IN NACL 40-0.9 MEQ/L-% IV SOLN
INTRAVENOUS | Status: DC
  Administered 2011-08-19: 50 mL/h via INTRAVENOUS
  Administered 2011-08-20: 03:00:00 via INTRAVENOUS
  Filled 2011-08-19 (×5): qty 1000

## 2011-08-19 MED ORDER — COLESEVELAM HCL 3.75 G PO PACK: 1.0000 | PACK | Freq: Every day | ORAL | Status: DC

## 2011-08-19 MED ORDER — DIAZEPAM 5 MG PO TABS: 2.5000 mg | ORAL_TABLET | Freq: Every day | ORAL | Status: DC | PRN

## 2011-08-19 MED ORDER — DEXAMETHASONE SODIUM PHOSPHATE 4 MG/ML IJ SOLN
4.0000 mg | Freq: Two times a day (BID) | INTRAMUSCULAR | Status: DC
  Administered 2011-08-19 – 2011-08-21 (×5): 4 mg via INTRAVENOUS
  Filled 2011-08-19 (×8): qty 1

## 2011-08-19 MED ORDER — BISACODYL 10 MG RE SUPP: 10.0000 mg | Freq: Every day | RECTAL | Status: DC | PRN

## 2011-08-19 MED ORDER — MAGNESIUM HYDROXIDE 400 MG/5ML PO SUSP: 30.0000 mL | Freq: Every day | ORAL | Status: DC | PRN

## 2011-08-19 MED ORDER — INSULIN ASPART 100 UNIT/ML ~~LOC~~ SOLN
0.0000 [IU] | SUBCUTANEOUS | Status: DC
  Administered 2011-08-19: 3 [IU] via SUBCUTANEOUS
  Administered 2011-08-19: 2 [IU] via SUBCUTANEOUS
  Administered 2011-08-19: 3 [IU] via SUBCUTANEOUS
  Administered 2011-08-20 (×2): 2 [IU] via SUBCUTANEOUS
  Administered 2011-08-20: 3 [IU] via SUBCUTANEOUS
  Administered 2011-08-20 – 2011-08-21 (×2): 2 [IU] via SUBCUTANEOUS
  Administered 2011-08-21: 3 [IU] via SUBCUTANEOUS
  Administered 2011-08-21 (×3): 2 [IU] via SUBCUTANEOUS
  Administered 2011-08-22: 3 [IU] via SUBCUTANEOUS

## 2011-08-19 MED ORDER — ALBUTEROL 90 MCG/ACT IN AERS: 2.0000 | INHALATION_SPRAY | Freq: Four times a day (QID) | RESPIRATORY_TRACT | Status: DC | PRN

## 2011-08-19 MED ORDER — ALBUTEROL SULFATE HFA 108 (90 BASE) MCG/ACT IN AERS: 2.0000 | INHALATION_SPRAY | Freq: Four times a day (QID) | RESPIRATORY_TRACT | Status: DC | PRN

## 2011-08-19 MED ORDER — DARIFENACIN HYDROBROMIDE ER 7.5 MG PO TB24
7.5000 mg | ORAL_TABLET | Freq: Every day | ORAL | Status: DC
  Administered 2011-08-19 – 2011-08-22 (×4): 7.5 mg via ORAL
  Filled 2011-08-19 (×5): qty 1

## 2011-08-19 MED ORDER — HYDROXYZINE HCL 50 MG/ML IM SOLN
50.0000 mg | Freq: Four times a day (QID) | INTRAMUSCULAR | Status: DC | PRN
  Filled 2011-08-19: qty 1

## 2011-08-19 MED ORDER — DEXAMETHASONE SODIUM PHOSPHATE 4 MG/ML IJ SOLN
4.0000 mg | Freq: Once | INTRAMUSCULAR | Status: AC
  Administered 2011-08-19: 4 mg via INTRAVENOUS

## 2011-08-19 MED ORDER — LOSARTAN POTASSIUM 50 MG PO TABS
100.0000 mg | ORAL_TABLET | Freq: Every day | ORAL | Status: DC
  Administered 2011-08-19 – 2011-08-22 (×3): 100 mg via ORAL
  Filled 2011-08-19 (×4): qty 2

## 2011-08-19 MED ORDER — HYDROXYZINE HCL 25 MG PO TABS
50.0000 mg | ORAL_TABLET | ORAL | Status: DC | PRN
  Filled 2011-08-19: qty 2

## 2011-08-19 MED ORDER — METOPROLOL SUCCINATE ER 100 MG PO TB24
100.0000 mg | ORAL_TABLET | Freq: Every day | ORAL | Status: DC
  Administered 2011-08-19 – 2011-08-22 (×4): 100 mg via ORAL
  Filled 2011-08-19 (×5): qty 1

## 2011-08-19 MED ORDER — LEVETIRACETAM ER 500 MG PO TB24
500.0000 mg | ORAL_TABLET | Freq: Two times a day (BID) | ORAL | Status: DC
  Administered 2011-08-19 – 2011-08-22 (×7): 500 mg via ORAL
  Filled 2011-08-19 (×9): qty 1

## 2011-08-19 MED ORDER — HYDROCODONE-ACETAMINOPHEN 5-325 MG PO TABS
1.0000 | ORAL_TABLET | ORAL | Status: DC | PRN
  Administered 2011-08-22 (×2): 2 via ORAL
  Filled 2011-08-19 (×2): qty 2

## 2011-08-19 MED ORDER — COLESEVELAM HCL 625 MG PO TABS
1875.0000 mg | ORAL_TABLET | Freq: Two times a day (BID) | ORAL | Status: DC
  Administered 2011-08-19 – 2011-08-22 (×7): 1875 mg via ORAL
  Filled 2011-08-19 (×9): qty 3

## 2011-08-19 MED ORDER — PANTOPRAZOLE SODIUM 40 MG PO TBEC
40.0000 mg | DELAYED_RELEASE_TABLET | Freq: Every day | ORAL | Status: DC
  Administered 2011-08-19 – 2011-08-21 (×3): 40 mg via ORAL
  Filled 2011-08-19 (×2): qty 1

## 2011-08-19 MED ORDER — POTASSIUM CHLORIDE ER 10 MEQ PO TBCR
10.0000 meq | EXTENDED_RELEASE_TABLET | Freq: Every day | ORAL | Status: DC
  Administered 2011-08-19 – 2011-08-22 (×4): 10 meq via ORAL
  Filled 2011-08-19 (×5): qty 1

## 2011-08-19 MED ORDER — METFORMIN HCL 500 MG PO TABS
500.0000 mg | ORAL_TABLET | Freq: Two times a day (BID) | ORAL | Status: DC
  Administered 2011-08-19 – 2011-08-22 (×7): 500 mg via ORAL
  Filled 2011-08-19 (×9): qty 1

## 2011-08-19 MED ORDER — LOSARTAN POTASSIUM-HCTZ 100-12.5 MG PO TABS: 1.0000 | ORAL_TABLET | Freq: Every day | ORAL | Status: DC

## 2011-08-19 NOTE — Progress Notes (Signed)
Filed Vitals:   08/19/11 0700 08/19/11 0800 08/19/11 0900 08/19/11 1000  BP: 137/62 121/100 119/61 120/67  Pulse: 75 78 83 83  Temp:      TempSrc:      Resp: 17 21 17 20   Height:      Weight:      SpO2: 100% 99% 99% 100%    I spoke with Dr. Lovell Sheehan by phone this morning. I reported the patient's difficulties in the for readmission. He's escalate ahead and resume the Keppra which he had treated her with perioperatively.  Plan: Keppra 500 mg by mouth twice a day. Dr. Lovell Sheehan a followup in a.m.  Hewitt Shorts, MD 08/19/2011, 10:24 AM

## 2011-08-19 NOTE — ED Notes (Signed)
Pt. Sitting up in bed. A.O. X 4. Respirations even and un labored. Vitals stable. Updated on plan of care: aware that she will be admitted. Currently awaiting bed placement to the floor. Denies pain.  Pleasant affect. Family at bedside. Denies any further requests at this time.

## 2011-08-19 NOTE — ED Notes (Signed)
Neurosurgeon at bedside °

## 2011-08-19 NOTE — ED Notes (Signed)
Patient answered all questions appropriately, however, when I asked her if she knew who was sitting in the room with [place] she asked what? Almost like she did not hear me.  I asked again, and again she asked What? Like she did not hear me.  When I did the GCS, she followed commands, knew the date, time and where she was at.  I asked her who was in the room with [place] and she said her daughter Maryln Gottron.  Will do neuro checks again in 2hrs.

## 2011-08-19 NOTE — ED Provider Notes (Signed)
History     CSN: [id number]  Arrival date & time 08/19/11  0019   First MD Initiated Contact with Patient 08/19/11 0112      Chief Complaint  Patient presents with  . Altered Mental Status    (Consider location/radiation/quality/duration/timing/severity/associated sxs/prior treatment) HPI Comments: Patient had meningioma resection on July 22 by Dr. Lovell Sheehan. She was sent home yesterday. Today her family noticed change in mental status. She's had episodes is a slow respond, with a blank look on her face and not answering questions. She then become awake and alert and answers questions appropriately. Family thinks that she is taking a long time to answer questions. His been no fevers, cough, congestion, chest pain, abdominal pain. Patient denies any focal weakness, numbness, tingling  The history is provided by the patient.    Past Medical History  Diagnosis Date  . Allergy   . Asthma   . Hyperlipidemia   . Hypertension   . Depression   . GERD (gastroesophageal reflux disease)   . Peptic ulcer disease   . Palpitations   . Multiple meningiomas of spine and brain   . Hyperglycemia   . Obstructive sleep apnea     STUDY AT [facility] DOES NOT USE C PAP  . Diabetes mellitus     TYPE ONE  . Hyperthyroidism     PT HAD BIPOSY -NEGATIVE.Marland Kitchen AND NOW JUST GETS CHECKED EACH YEAR .Marland Kitchen 2013 LAST TEST   SEES DR. [name].   Marland Kitchen Arthritis     Past Surgical History  Procedure Date  . Abdominal hysterectomy   . Tonsillectomy   . Tubal ligation   . Right knee arthroscopy     x2  . Right foot bunectomy   . Right shoulder rotator cuff   . L4-l5 posterior la   . C5-c6 neck fusion   . Left foot plantar and hammertoe   . Brain meningioma excision 8/10  . Back surgery     L SPIN 2003   NECK 2004  . Dilation and curettage of uterus     YEARS AGO  . Craniotomy 08/13/2011    Procedure: CRANIOTOMY TUMOR EXCISION;  Surgeon: Cristi Loron, MD;  Location: [facility];  Service: Neurosurgery;   Laterality: Bilateral;  Bifrontal craniotomy for tumor    Family History  Problem Relation Age of Onset  . Cancer Mother     ovarian  . Heart disease Father 19  . Arthritis Other   . Hyperlipidemia Other   . Hypertension Other   . Diabetes Other   . Anesthesia problems Daughter     NAUSEA AND VOMITING POST OP    History  Substance Use Topics  . Smoking status: Never Smoker   . Smokeless tobacco: Not on file  . Alcohol Use: No    OB History    Grav Para Term Preterm Abortions TAB SAB Ect Mult Living                  Review of Systems  Constitutional: Positive for activity change and appetite change. Negative for fever and fatigue.  HENT: Negative for congestion and rhinorrhea.   Respiratory: Negative for cough, chest tightness and shortness of breath.   Cardiovascular: Negative for chest pain.  Gastrointestinal: Negative for nausea, vomiting and abdominal pain.  Genitourinary: Negative for dysuria and hematuria.  Musculoskeletal: Negative for back pain.  Skin: Negative for rash.  Neurological: Negative for headaches.  Psychiatric/Behavioral: Positive for confusion.    Allergies  Flagyl; Aspirin  effervescent; Morphine sulfate; and Penicillins  Home Medications   Current Outpatient Rx  Name Route Sig Dispense Refill  . DIAZEPAM 5 MG PO TABS Oral Take 2.5 mg by mouth daily as needed. For anxiety    . DSS 100 MG PO CAPS Oral Take 100 mg by mouth 2 (two) times daily. 60 capsule 1  . OXYCODONE-ACETAMINOPHEN 10-325 MG PO TABS Oral Take 1 tablet by mouth every 4 (four) hours as needed. For pain    . ALBUTEROL 90 MCG/ACT IN AERS Inhalation Inhale 2 puffs into the lungs every 6 (six) hours as needed. For shortness of breath    . CETIRIZINE HCL 10 MG PO TABS Oral Take 10 mg by mouth daily as needed. For allergies    . COLESEVELAM HCL 3.75 G PO PACK Oral Take 1 each by mouth daily.    Marland Kitchen DIAZEPAM 5 MG PO TABS Oral Take 0.5 tablets (2.5 mg total) by mouth daily as needed for  anxiety. 50 tablet 1  . DICLOFENAC SODIUM 1 % TD GEL Topical Apply 1 application topically 2 (two) times daily.    Marland Kitchen FLUTICASONE PROPIONATE 50 MCG/ACT NA SUSP Nasal Place 2 sprays into the nose daily as needed. For allergies, stuffy head    . FUROSEMIDE 40 MG PO TABS Oral Take 20 mg by mouth daily as needed. For fluid    . GERITOL COMPLETE PO Oral Take 1 tablet by mouth daily.    Marland Kitchen LOSARTAN POTASSIUM-HCTZ 100-12.5 MG PO TABS Oral Take 1 tablet by mouth daily. 90 tablet 3  . MAGNESIUM OXIDE 400 (241.3 MG) MG PO TABS Oral Take 1,200 mg by mouth daily.    Marland Kitchen MECLIZINE HCL 25 MG PO TABS Oral Take 25 mg by mouth every 6 (six) hours as needed. For vertigo    . METFORMIN HCL 500 MG PO TABS Oral Take 1 tablet (500 mg total) by mouth 2 (two) times daily with a meal. 180 tablet 3  . METOPROLOL SUCCINATE ER 100 MG PO TB24 Oral Take 1 tablet (100 mg total) by mouth daily. 90 tablet 3  . NABUMETONE 500 MG PO TABS Oral Take 500 mg by mouth daily as needed. For Arthritis pain    . ONETOUCH LANCETS MISC  Check BS twice daily    . OVER THE COUNTER MEDICATION Oral Take 2 tablets by mouth daily. Arthritis pain    . OVER THE COUNTER MEDICATION Oral Take 5 mLs by mouth daily.    Marland Kitchen OVER THE COUNTER MEDICATION Oral Take 10 mLs by mouth daily.    Marland Kitchen PITAVASTATIN CALCIUM 1 MG PO TABS Oral Take 1 tablet by mouth every 3 (three) days.    Marland Kitchen POTASSIUM CHLORIDE ER 10 MEQ PO TBCR Oral Take 10 mEq by mouth daily. May take when pt takes Lasix prn    . RABEPRAZOLE SODIUM 20 MG PO TBEC Oral Take 1 tablet (20 mg total) by mouth daily. 90 tablet 3  . SOLIFENACIN SUCCINATE 5 MG PO TABS Oral Take 1 tablet (5 mg total) by mouth daily. 90 tablet 3  . VALACYCLOVIR HCL 1 G PO TABS Oral Take 1,000 mg by mouth 2 (two) times daily as needed. For mouth blisters, and nose blisters      BP 126/68  Pulse 67  Temp 98.8 F (37.1 C) (Oral)  Resp 18  SpO2 100%  Physical Exam  Constitutional: She is oriented to person, place, and time. She  appears well-developed and well-nourished. No distress.  HENT:  Head: Normocephalic.  Mouth/Throat: Oropharynx is clear and moist. No oropharyngeal exudate.       Surgical sutures and staples intact, clean and dry  Eyes: Conjunctivae and EOM are normal. Pupils are equal, round, and reactive to light.  Neck: Normal range of motion. Neck supple.  Cardiovascular: Normal rate, regular rhythm and normal heart sounds.   Pulmonary/Chest: Effort normal and breath sounds normal. No respiratory distress.  Abdominal: Soft. There is no tenderness. There is no rebound and no guarding.  Musculoskeletal: Normal range of motion. She exhibits no edema and no tenderness.  Neurological: She is alert and oriented to person, place, and time. No cranial nerve deficit.       5 out of 5 strength throughout, cranial nerves 3-12 intact, no nystagmus, no ataxia finger to nose, equal grip strength bilaterally.  Skin: Skin is warm.    ED Course  Procedures (including critical care time)  Labs Reviewed  CBC WITH DIFFERENTIAL - Abnormal; Notable for the following:    RBC 3.13 (*)     Hemoglobin 8.2 (*)     HCT 24.1 (*)     MCV 77.0 (*)     All other components within normal limits  COMPREHENSIVE METABOLIC PANEL - Abnormal; Notable for the following:    Potassium 3.2 (*)     Glucose, Bld 155 (*)     Albumin 3.2 (*)     GFR calc non Af Amer 56 (*)     GFR calc Af Amer 65 (*)     All other components within normal limits  PROTIME-INR  LACTIC ACID, PLASMA  AMMONIA  URINALYSIS, ROUTINE W REFLEX MICROSCOPIC   Dg Chest 2 View  08/19/2011  *RADIOLOGY REPORT*  Clinical Data: Altered mental status  CHEST - 2 VIEW  Comparison: 06/09/2010  Findings: Mild right hemidiaphragm elevation, similar to prior. Hypoaeration results in interstitial and vascular crowding. This, no focal consolidation, pleural effusion, or pneumothorax. Cardiomediastinal contours are unchanged allowing for differences in this three effort.  Aortic  arch atherosclerosis.  Partially imaged cervical fusion hardware.  No acute osseous change.  IMPRESSION: Hypoaeration results in interstitial vascular crowding.  No focal consolidation.  Original Report Authenticated By: Waneta Martins, M.D.     No diagnosis found.    MDM  Mental status change after recent meningioma resection. No focal neurological deficits. Waxing and waning episodes of delayed responsiveness.  Hemoglobin has dropped 4 g in the past 2 weeks.  FOBT negative, attributable to operative lost.  D/w Newell Coral.  Suspects postoperative blood loss as no value obtained after surgery. FOBT negative.  Dr. Newell Coral has reviewed CT.  Does not think MRI necessary.  He plans to admit patient for steroid treatment.   Date: 08/19/2011  Rate: 74  Rhythm: normal sinus rhythm  QRS Axis: normal  Intervals: normal  ST/T Wave abnormalities: normal  Conduction Disutrbances:none  Narrative Interpretation: T wave flattening anterior leads  Old EKG Reviewed: changes noted    Glynn Octave, MD 08/19/11 [phone]

## 2011-08-19 NOTE — H&P (Signed)
Subjective: Patient is a 71 y.o. female who is readmitted after recent craniotomy by Dr. Delma Officer 6 days ago for multiple intracranial meningiomas, and was discharged about a half ago, but has been having difficulties at home with episodic word finding difficulties and seeming absences. She does also have a history of diabetes, and was discharged home off of Decadron. She was not treated with anticonvulsant therapy perioperatively or following discharge. The patient and her 3 daughters note that at times her speech seemed normal and she had no difficulty expressing herself, but at other times she did not seem to find the words or seemed to stare out blankly. No frank motor seizure activity has been noted. Blood sugars at home have running between 150 and 190. Symptomatically the patient denies symptoms of headache, vision difficulties, or weakness.   Patient Active Problem List   Diagnosis Date Noted  . Meningioma 08/13/2011  . LLQ abdominal pain 07/07/2011  . DIZZINESS 11/02/2009  . DIABETES MELLITUS, TYPE II 05/20/2009  . HYPOKALEMIA 05/02/2009  . DEPRESSION 06/24/2008  . GERD 06/24/2008  . PEPTIC ULCER DISEASE 06/24/2008  . TMJ SYNDROME 06/09/2008  . HYPERLIPIDEMIA 11/08/2006  . OBSTRUCTIVE SLEEP APNEA 11/08/2006  . HYPERTENSION 11/08/2006  . ALLERGIC RHINITIS 11/08/2006  . VOCAL CORD DISORDER 11/08/2006  . ASTHMA 11/08/2006  . TACHYARRHYTHMIA 11/08/2006   Past Medical History  Diagnosis Date  . Allergy   . Asthma   . Hyperlipidemia   . Hypertension   . Depression   . GERD (gastroesophageal reflux disease)   . Peptic ulcer disease   . Palpitations   . Multiple meningiomas of spine and brain   . Hyperglycemia   . Obstructive sleep apnea     STUDY AT [facility] DOES NOT USE C PAP  . Diabetes mellitus     TYPE ONE  . Hyperthyroidism     PT HAD BIPOSY -NEGATIVE.Marland Kitchen AND NOW JUST GETS CHECKED EACH YEAR .Marland Kitchen 2013 LAST TEST   SEES DR. [name].   Marland Kitchen Arthritis     Past Surgical History    Procedure Date  . Abdominal hysterectomy   . Tonsillectomy   . Tubal ligation   . Right knee arthroscopy     x2  . Right foot bunectomy   . Right shoulder rotator cuff   . L4-l5 posterior la   . C5-c6 neck fusion   . Left foot plantar and hammertoe   . Brain meningioma excision 8/10  . Back surgery     L SPIN 2003   NECK 2004  . Dilation and curettage of uterus     YEARS AGO  . Craniotomy 08/13/2011    Procedure: CRANIOTOMY TUMOR EXCISION;  Surgeon: Cristi Loron, MD;  Location: [facility];  Service: Neurosurgery;  Laterality: Bilateral;  Bifrontal craniotomy for tumor     (Not in a hospital admission) Allergies  Allergen Reactions  . Flagyl (Metronidazole) Other (See Comments)    Caused increased heart rate  . Aspirin Effervescent Swelling    Alka-Seltzer gel caps- sore mouth, face swollen, turned hands white  . Morphine Sulfate     REACTION: hives  . Penicillins     REACTION: rash    History  Substance Use Topics  . Smoking status: Never Smoker   . Smokeless tobacco: Not on file  . Alcohol Use: No    Family History  Problem Relation Age of Onset  . Cancer Mother     ovarian  . Heart disease Father 67  .  Arthritis Other   . Hyperlipidemia Other   . Hypertension Other   . Diabetes Other   . Anesthesia problems Daughter     NAUSEA AND VOMITING POST OP     Review of Systems A comprehensive review of systems was negative.  Objective: Vital signs in last 24 hours: Temp:  [98.6 F (37 C)-98.8 F (37.1 C)] 98.8 F (37.1 C) (07/28 0117) Pulse Rate:  [67] 67  (07/28 0117) Resp:  [18] 18  (07/28 0117) BP: (126-137)/(68-70) 126/68 mmHg (07/28 0117) SpO2:  [100 %] 100 % (07/28 0117)  EXAM: Patient is a somewhat obese black female in no acute distress.  Incision is healing nicely, it is clean and dry. Lungs are clear to auscultation , the patient has symmetrical respiratory excursion. Heart has a regular rate and rhythm normal S1 and S2 no murmur.   Abdomen  is soft nontender nondistended bowel sounds are present. Extremity examination shows no clubbing cyanosis or edema. Mental status shows the patient is awake alert, oriented to name, [facility] hospital, in July 2013. She is following commands, her speech is fluent, she is naming well and repeating well. Cranial nerves show pupils are equal round and reactive to light, approximately 2.5 mm in diameter. Extraocular movements are intact. Facial sensation is intact. Facial movement is symmetrical. Hearing is present probably. Palatal movement is symmetrical. Shoulder shrug is symmetrical. Tongue is midline. Motor examination shows 5 over 5 strength in the upper and lower extremities, she has no drift of the upper extremities. Sensation is intact to pinprick in the upper and lower extremities. Reflexes are diminished, but symmetrical. Toes are downgoing. Gait and stance are not tested due to the nature of her current condition.  Data Review:CBC    Component Value Date/Time   WBC 6.6 08/19/2011 0135   RBC 3.13* 08/19/2011 0135   HGB 8.2* 08/19/2011 0135   HCT 24.1* 08/19/2011 0135   PLT 215 08/19/2011 0135   MCV 77.0* 08/19/2011 0135   MCH 26.2 08/19/2011 0135   MCHC 34.0 08/19/2011 0135   RDW 14.3 08/19/2011 0135   LYMPHSABS 1.2 08/19/2011 0135   MONOABS 0.3 08/19/2011 0135   EOSABS 0.1 08/19/2011 0135   BASOSABS 0.0 08/19/2011 0135                          BMET    Component Value Date/Time   NA 136 08/19/2011 0135   K 3.2* 08/19/2011 0135   CL 98 08/19/2011 0135   CO2 28 08/19/2011 0135   GLUCOSE 155* 08/19/2011 0135   BUN 13 08/19/2011 0135   CREATININE 1.01 08/19/2011 0135   CALCIUM 8.5 08/19/2011 0135   GFRNONAA 56* 08/19/2011 0135   GFRAA 65* 08/19/2011 0135     Assessment/Plan: Patient six-day status post craniotomy for multiple intracranial meningiomas, discharge about a day and a half ago, who returns to the hospital because of episodic work finding difficulties and seeming absences. CT scan this  evening in the emergency room shows typical postsurgical changes without obvious infarction or intra-or extra-axial hematoma. Laboratories show mild postoperative anemia, but a normal sodium, but mild hypokalemia.  I discussed the situation with the patient and her 3 daughters were present. I've recommended admission to the hospital for intravenous Decadron, observation in the intensive unit, and an EEG to rule out episodic seizure activity. We will order her home meds, but also a sliding-scale insulin schedule. We will recheck labs on 08/20/2011 a.m. In the  long run she may well require anticonvulsant therapy.  I've explained to the patient and her family that she will be seen by Dr. Lovell Sheehan tomorrow morning in followup.   Hewitt Shorts, MD 08/19/2011 3:44 AM

## 2011-08-19 NOTE — Progress Notes (Signed)
08-19-11 Patient had an episode where she was unable to answer questions related to person, time and place. These episodes are sporadic and last less than 60 seconds, then patient returns to baseline. Upon initial assessment and throughout the morning patient was able to answer questions related to orientation except when noted above.  Corliss Skains RN

## 2011-08-19 NOTE — ED Notes (Addendum)
Patient with language problems since 10am on 7/27.  Patient had craniotomy on 7/22.  Patient has some confusion on current events.  Patient has some inappropriate answers, slow to answer, has to really think of answers.

## 2011-08-19 NOTE — ED Notes (Signed)
Family at bedside. 

## 2011-08-20 ENCOUNTER — Inpatient Hospital Stay (HOSPITAL_COMMUNITY): Payer: Medicare Other

## 2011-08-20 DIAGNOSIS — R569 Unspecified convulsions: Secondary | ICD-10-CM

## 2011-08-20 LAB — CBC
HCT: 24.4 % — ABNORMAL LOW (ref 36.0–46.0)
Hemoglobin: 8.3 g/dL — ABNORMAL LOW (ref 12.0–15.0)
MCH: 26.2 pg (ref 26.0–34.0)
MCHC: 34 g/dL (ref 30.0–36.0)
MCV: 77 fL — ABNORMAL LOW (ref 78.0–100.0)
Platelets: 259 10*3/uL (ref 150–400)
RBC: 3.17 MIL/uL — ABNORMAL LOW (ref 3.87–5.11)
RDW: 14.2 % (ref 11.5–15.5)
WBC: 8 10*3/uL (ref 4.0–10.5)

## 2011-08-20 LAB — BASIC METABOLIC PANEL
BUN: 13 mg/dL (ref 6–23)
CO2: 24 mEq/L (ref 19–32)
Calcium: 8.7 mg/dL (ref 8.4–10.5)
Chloride: 102 mEq/L (ref 96–112)
Creatinine, Ser: 0.91 mg/dL (ref 0.50–1.10)
GFR calc Af Amer: 73 mL/min — ABNORMAL LOW (ref >90)
GFR calc non Af Amer: 63 mL/min — ABNORMAL LOW (ref >90)
Glucose, Bld: 161 mg/dL — ABNORMAL HIGH (ref 70–99)
Potassium: 3.9 mEq/L (ref 3.5–5.1)
Sodium: 137 mEq/L (ref 135–145)

## 2011-08-20 LAB — GLUCOSE, CAPILLARY
Glucose-Capillary: 141 mg/dL — ABNORMAL HIGH (ref 70–99)
Glucose-Capillary: 151 mg/dL — ABNORMAL HIGH (ref 70–99)
Glucose-Capillary: 93 mg/dL (ref 70–99)
Glucose-Capillary: 116 mg/dL — ABNORMAL HIGH (ref 70–99)
Glucose-Capillary: 148 mg/dL — ABNORMAL HIGH (ref 70–99)
Glucose-Capillary: 130 mg/dL — ABNORMAL HIGH (ref 70–99)

## 2011-08-20 LAB — DIFFERENTIAL
Basophils Absolute: 0 10*3/uL (ref 0.0–0.1)
Basophils Relative: 0 % (ref 0–1)
Eosinophils Absolute: 0 10*3/uL (ref 0.0–0.7)
Eosinophils Relative: 0 % (ref 0–5)
Lymphocytes Relative: 11 % — ABNORMAL LOW (ref 12–46)
Lymphs Abs: 0.9 10*3/uL (ref 0.7–4.0)
Monocytes Absolute: 0.3 10*3/uL (ref 0.1–1.0)
Monocytes Relative: 3 % (ref 3–12)
Neutro Abs: 6.9 10*3/uL (ref 1.7–7.7)
Neutrophils Relative %: 86 % — ABNORMAL HIGH (ref 43–77)

## 2011-08-20 NOTE — Progress Notes (Signed)
Patient ID: Dawn Salas, female   DOB: 10-Jan-1941, 71 y.o.   MRN: [id number] Subjective:  The patient is alert and pleasant. She looks well. While I was talking to her she did have an episode where she became a bit aphasic. There was no tonic-clonic motion. It lasted approximately 10 seconds and resolved quickly.  Objective: Vital signs in last 24 hours: Temp:  [97.4 F (36.3 C)-98.5 F (36.9 C)] 97.4 F (36.3 C) (07/29 0000) Pulse Rate:  [63-83] 71  (07/29 0600) Resp:  [14-22] 15  (07/29 0600) BP: (100-141)/(46-100) 109/57 mmHg (07/29 0600) SpO2:  [97 %-100 %] 100 % (07/29 0600)  Intake/Output from previous day: 07/28 0701 - 07/29 0700 In: 1770 [P.O.:720; I.V.:1050] Out: 250 [Urine:250] Intake/Output this shift:    Physical exam the patient is alert and oriented. Her strength is normal. Her incision is healing well. Her pupils are equal.  Lab Results:  Basename 08/20/11 0530 08/19/11 0135  WBC 8.0 6.6  HGB 8.3* 8.2*  HCT 24.4* 24.1*  PLT 259 215   BMET  Basename 08/20/11 0530 08/19/11 0135  NA 137 136  K 3.9 3.2*  CL 102 98  CO2 24 28  GLUCOSE 161* 155*  BUN 13 13  CREATININE 0.91 1.01  CALCIUM 8.7 8.5    Studies/Results: Dg Chest 2 View  08/19/2011  *RADIOLOGY REPORT*  Clinical Data: Altered mental status  CHEST - 2 VIEW  Comparison: 06/09/2010  Findings: Mild right hemidiaphragm elevation, similar to prior. Hypoaeration results in interstitial and vascular crowding. This, no focal consolidation, pleural effusion, or pneumothorax. Cardiomediastinal contours are unchanged allowing for differences in this three effort.  Aortic arch atherosclerosis.  Partially imaged cervical fusion hardware.  No acute osseous change.  IMPRESSION: Hypoaeration results in interstitial vascular crowding.  No focal consolidation.  Original Report Authenticated By: Waneta Martins, M.D.   Ct Head Wo Contrast  08/19/2011  *RADIOLOGY REPORT*  Clinical Data: Altered mental status  CT  HEAD WITHOUT CONTRAST  Technique:  Contiguous axial images were obtained from the base of the skull through the vertex without contrast.  Comparison: 06/06/2011  Findings: There is air and extra-axial blood along the midline frontal convexity and anterior falx, underlying the recent craniotomy.  No significant mass effect.  No acute intraparenchymal hemorrhage.  There are areas of hypoattenuation involving the frontal parenchyma.  No hydrocephalous.  Bilateral basal ganglia hypoattenuation may reflect prominent perivascular spaces or less likely remote lacunar infarctions.  The visualized sinuses and mastoid air cells are predominately clear.  IMPRESSION: Air and small amount of extra-axial blood underlies the recent frontal craniotomy.  No significant mass effect.  Infection cannot be excluded without comparison to evaluate whether the air has increased or decreased postoperatively.  Mild associated areas of hypoattenuation involving the frontal lobe parenchyma. May reflect postoperative changes/edema or infarction.  Original Report Authenticated By: Waneta Martins, M.D.    Assessment/Plan: Postop day #5: The patient's followup scan looked good.  Probable seizures: The patient is going to get an EEG today. I will asked the neurologist for further suggestions.  LOS: 1 day     Jerrica Thorman D 08/20/2011, 7:57 AM

## 2011-08-21 DIAGNOSIS — G40109 Localization-related (focal) (partial) symptomatic epilepsy and epileptic syndromes with simple partial seizures, not intractable, without status epilepticus: Secondary | ICD-10-CM | POA: Diagnosis present

## 2011-08-21 DIAGNOSIS — G4733 Obstructive sleep apnea (adult) (pediatric): Secondary | ICD-10-CM

## 2011-08-21 LAB — GLUCOSE, CAPILLARY
Glucose-Capillary: 115 mg/dL — ABNORMAL HIGH (ref 70–99)
Glucose-Capillary: 140 mg/dL — ABNORMAL HIGH (ref 70–99)
Glucose-Capillary: 141 mg/dL — ABNORMAL HIGH (ref 70–99)
Glucose-Capillary: 147 mg/dL — ABNORMAL HIGH (ref 70–99)
Glucose-Capillary: 173 mg/dL — ABNORMAL HIGH (ref 70–99)
Glucose-Capillary: 85 mg/dL (ref 70–99)

## 2011-08-21 MED ORDER — DEXAMETHASONE 4 MG PO TABS
4.0000 mg | ORAL_TABLET | Freq: Two times a day (BID) | ORAL | Status: DC
  Administered 2011-08-21 – 2011-08-22 (×2): 4 mg via ORAL
  Filled 2011-08-21 (×3): qty 1

## 2011-08-21 NOTE — Consult Note (Signed)
[facility] CONSULT NOTE     Reason for Consult: seizure   HPI:    Dawn Salas is an 71 y.o. female with recent craniotomy for tumor excision 08/13/11.  Post operatively patient was noted by family members to have having difficulties with  word finding and at times staring spells. She was not treated with anticonvulsant therapy perioperatively or following surgery. While in the hospital patient was noted to have a episodic period of aphasia without TC activity on 08/20/11. EEG was obtained 08/20/11 and showed beta activities over the left paramedian region suggest an underlying skull defect, likely related to her recent craniotomy but there was not interictal epileptiform discharges seen. Patient was placed on Keppra 500 mg BID and neurology was asked to see patient for further recommendations.    Past Medical History  Diagnosis Date  . Allergy   . Asthma   . Hyperlipidemia   . Hypertension   . Depression   . GERD (gastroesophageal reflux disease)   . Peptic ulcer disease   . Palpitations   . Multiple meningiomas of spine and brain   . Hyperglycemia   . Obstructive sleep apnea     STUDY [facility] DOES NOT USE C PAP  . Diabetes mellitus     TYPE ONE  . Hyperthyroidism     PT HAD BIPOSY -NEGATIVE.Marland Kitchen AND NOW JUST GETS CHECKED EACH YEAR .Marland Kitchen 2013 LAST TEST   SEES DR. [name].   Marland Kitchen Arthritis     Past Surgical History  Procedure Date  . Abdominal hysterectomy   . Tonsillectomy   . Tubal ligation   . Right knee arthroscopy     x2  . Right foot bunectomy   . Right shoulder rotator cuff   . L4-l5 posterior la   . C5-c6 neck fusion   . Left foot plantar and hammertoe   . Brain meningioma excision 8/10  . Back surgery     L SPIN 2003   NECK 2004  . Dilation and curettage of uterus     YEARS AGO  . Craniotomy 08/13/2011    Procedure: CRANIOTOMY TUMOR EXCISION;  Surgeon: Cristi Loron, MD;  Location: [facility];  Service: Neurosurgery;   Laterality: Bilateral;  Bifrontal craniotomy for tumor    Family History  Problem Relation Age of Onset  . Cancer Mother     ovarian  . Heart disease Father 17  . Arthritis Other   . Hyperlipidemia Other   . Hypertension Other   . Diabetes Other   . Anesthesia problems Daughter     NAUSEA AND VOMITING POST OP    Social History:  reports that she has never smoked. She does not have any smokeless tobacco history on file. She reports that she does not drink alcohol or use illicit drugs.  Allergies  Allergen Reactions  . Flagyl (Metronidazole) Other (See Comments)    Caused increased heart rate  . Aspirin Effervescent Swelling    Alka-Seltzer gel caps- sore mouth, face swollen, turned hands white  . Morphine Sulfate     REACTION: hives  . Penicillins     REACTION: rash    Medications:    Scheduled:   . colesevelam  1,875 mg Oral BID WC  . darifenacin  7.5 mg Oral Daily  . dexamethasone  4 mg Intravenous Q12H  . losartan  100 mg Oral  Daily   And  . hydrochlorothiazide  12.5 mg Oral Daily  . insulin aspart  0-15 Units Subcutaneous Q4H  . levETIRAcetam  500 mg Oral BID  . metFORMIN  500 mg Oral BID WC  . metoprolol succinate  100 mg Oral Daily  . pantoprazole  40 mg Oral Q1200  . potassium chloride  10 mEq Oral Daily   Continuous:   . 0.9 % NaCl with KCl 40 mEq / L 50 mL/hr at 08/20/11 0242    Review of Systems - General ROS: negative for - chills, fatigue, fever or hot flashes Hematological and Lymphatic ROS: negative for - bruising, fatigue, jaundice or pallor Endocrine ROS: negative for - hair pattern changes, hot flashes, mood swings or skin changes Respiratory ROS: negative for - cough, hemoptysis, orthopnea or wheezing Cardiovascular ROS: negative for - dyspnea on exertion, orthopnea, palpitations or shortness of breath Gastrointestinal ROS: negative for - abdominal pain, appetite loss, blood in stools, diarrhea or hematemesis Musculoskeletal ROS: negative for  - joint pain, joint stiffness, joint swelling or muscle pain Neurological ROS: positive for - confusion, speech problems and staring spells Dermatological ROS: negative for dry skin, pruritus and rash   Blood pressure 133/68, pulse 72, temperature 97.4 F (36.3 C), temperature source Oral, resp. rate 16, height 5\' 7"  (1.702 m), weight 100.1 kg (220 lb 10.9 oz), SpO2 100.00%.   Neurologic Examination:   Mental Status: Alert, oriented X 3.  Speech fluent without evidence of aphasia. Able to follow 3 step commands without difficulty. Mood: up beat Memory: intact Thought content appropriate Cranial Nerves: II-Visual fields grossly intact. III/IV/VI-Extraocular movements intact.  Pupils reactive bilaterally. Ptosis not present. V/VII-Smile symmetric VIII-grossly intact IX/X-normal gag XI-bilateral shoulder shrug XII-midline tongue extension Motor: 5/5 bilaterally with normal tone and bulk Sensory: Pinprick and light touch intact throughout, bilaterally Deep Tendon Reflexes: 2+ and symmetric throughout.     Plantars:      Right:  downgoing     Left:  Downgoing Cerebellar: Normal finger-to-nose,  normal heel-to-shin test.      Lab Results  Component Value Date/Time   CHOL 281* 11/02/2010  8:41 AM    Results for orders placed during the hospital encounter of 08/19/11 (from the past 48 hour(s))  GLUCOSE, CAPILLARY     Status: Abnormal   Collection Time   08/19/11 12:10 PM      Component Value Range Comment   Glucose-Capillary 129 (*) 70 - 99 mg/dL   GLUCOSE, CAPILLARY     Status: Abnormal   Collection Time   08/19/11  4:39 PM      Component Value Range Comment   Glucose-Capillary 169 (*) 70 - 99 mg/dL    Comment 1 Notify RN     GLUCOSE, CAPILLARY     Status: Abnormal   Collection Time   08/19/11  8:12 PM      Component Value Range Comment   Glucose-Capillary 181 (*) 70 - 99 mg/dL    Comment 1 Notify RN     GLUCOSE, CAPILLARY     Status: Abnormal   Collection Time   08/20/11  12:01 AM      Component Value Range Comment   Glucose-Capillary 141 (*) 70 - 99 mg/dL   GLUCOSE, CAPILLARY     Status: Abnormal   Collection Time   08/20/11  4:41 AM      Component Value Range Comment   Glucose-Capillary 151 (*) 70 - 99 mg/dL   BASIC METABOLIC PANEL  Status: Abnormal   Collection Time   08/20/11  5:30 AM      Component Value Range Comment   Sodium 137  135 - 145 mEq/L    Potassium 3.9  3.5 - 5.1 mEq/L DELTA CHECK NOTED   Chloride 102  96 - 112 mEq/L    CO2 24  19 - 32 mEq/L    Glucose, Bld 161 (*) 70 - 99 mg/dL    BUN 13  6 - 23 mg/dL    Creatinine, Ser 1.61  0.50 - 1.10 mg/dL    Calcium 8.7  8.4 - 09.6 mg/dL    GFR calc non Af Amer 63 (*) >90 mL/min    GFR calc Af Amer 73 (*) >90 mL/min   CBC     Status: Abnormal   Collection Time   08/20/11  5:30 AM      Component Value Range Comment   WBC 8.0  4.0 - 10.5 K/uL    RBC 3.17 (*) 3.87 - 5.11 MIL/uL    Hemoglobin 8.3 (*) 12.0 - 15.0 g/dL    HCT 04.5 (*) 40.9 - 46.0 %    MCV 77.0 (*) 78.0 - 100.0 fL    MCH 26.2  26.0 - 34.0 pg    MCHC 34.0  30.0 - 36.0 g/dL    RDW 81.1  91.4 - 78.2 %    Platelets 259  150 - 400 K/uL   DIFFERENTIAL     Status: Abnormal   Collection Time   08/20/11  5:30 AM      Component Value Range Comment   Neutrophils Relative 86 (*) 43 - 77 %    Neutro Abs 6.9  1.7 - 7.7 K/uL    Lymphocytes Relative 11 (*) 12 - 46 %    Lymphs Abs 0.9  0.7 - 4.0 K/uL    Monocytes Relative 3  3 - 12 %    Monocytes Absolute 0.3  0.1 - 1.0 K/uL    Eosinophils Relative 0  0 - 5 %    Eosinophils Absolute 0.0  0.0 - 0.7 K/uL    Basophils Relative 0  0 - 1 %    Basophils Absolute 0.0  0.0 - 0.1 K/uL   GLUCOSE, CAPILLARY     Status: Normal   Collection Time   08/20/11  8:21 AM      Component Value Range Comment   Glucose-Capillary 93  70 - 99 mg/dL   GLUCOSE, CAPILLARY     Status: Abnormal   Collection Time   08/20/11 11:54 AM      Component Value Range Comment   Glucose-Capillary 116 (*) 70 - 99 mg/dL     GLUCOSE, CAPILLARY     Status: Abnormal   Collection Time   08/20/11  4:16 PM      Component Value Range Comment   Glucose-Capillary 148 (*) 70 - 99 mg/dL   GLUCOSE, CAPILLARY     Status: Abnormal   Collection Time   08/20/11  7:30 PM      Component Value Range Comment   Glucose-Capillary 130 (*) 70 - 99 mg/dL   GLUCOSE, CAPILLARY     Status: Abnormal   Collection Time   08/21/11 12:22 AM      Component Value Range Comment   Glucose-Capillary 147 (*) 70 - 99 mg/dL   GLUCOSE, CAPILLARY     Status: Abnormal   Collection Time   08/21/11  3:13 AM      Component Value Range Comment  Glucose-Capillary 140 (*) 70 - 99 mg/dL   GLUCOSE, CAPILLARY     Status: Normal   Collection Time   08/21/11  8:00 AM      Component Value Range Comment   Glucose-Capillary 85  70 - 99 mg/dL     No results found.   Assessment/Plan:   71 YO female s/p frontal craniotomy for resection of meningioma on 08/13/11 presenting with transient staring spells.  Although EEG showed no epileptiform activity staring spells are likely manifestations of partial seizure activity.  Recommendation: 1) Continue Keppra 500 mg BID  2) Follow up with neurologist as out patient after discharge.  Patient likely will need to be on seizure medication long term.  3) Patient has been advised to not drive, operate heavy machinery, perform activities at heights and participate in water activities until approved by outpatient physician.      Felicie Morn PA-C [facility] [phone]  08/21/2011, 11:19 AM

## 2011-08-21 NOTE — Progress Notes (Signed)
Patient ID: Dawn Salas, female   DOB: 01-Feb-1940, 71 y.o.   MRN: [id number] Subjective:  The patient is alert and pleasant. She's had no further episodes of seizures. She looks well.  Objective: Vital signs in last 24 hours: Temp:  [97.6 F (36.4 C)-98.6 F (37 C)] 97.6 F (36.4 C) (07/30 0400) Pulse Rate:  [55-104] 67  (07/30 0600) Resp:  [12-30] 20  (07/30 0600) BP: (88-129)/(42-88) 128/53 mmHg (07/30 0600) SpO2:  [98 %-100 %] 100 % (07/30 0600) Weight:  [100.1 kg (220 lb 10.9 oz)] 100.1 kg (220 lb 10.9 oz) (07/30 0700)  Intake/Output from previous day: 07/29 0701 - 07/30 0700 In: 1200 [I.V.:1200] Out: -  Intake/Output this shift:    Physical exam the patient is alert and oriented x3. Her strength is normal. Her speech is normal. Her pupils are equal. Her incision is healing well.  Lab Results:  Basename 08/20/11 0530 08/19/11 0135  WBC 8.0 6.6  HGB 8.3* 8.2*  HCT 24.4* 24.1*  PLT 259 215   BMET  Basename 08/20/11 0530 08/19/11 0135  NA 137 136  K 3.9 3.2*  CL 102 98  CO2 24 28  GLUCOSE 161* 155*  BUN 13 13  CREATININE 0.91 1.01  CALCIUM 8.7 8.5    Studies/Results: No results found.  Assessment/Plan: Status post craniotomy for multiple meningiomas: Patient is doing well neurologically. She's had no seizure-like activity. Her EEG is abnormal but demonstrate no obvious seizures. I have asked neurology for their input. We will continue the Keppra and I'll plan to send her to the floor today.  LOS: 2 days     Kelleen Stolze D 08/21/2011, 7:51 AM

## 2011-08-21 NOTE — Procedures (Signed)
EEG#  [id number]  This routine EEG was requested in this 71 year old women who is having spells of difficulty finding words.  She has a history of a recent craniotomy for a meningioma resection.  Medications include diazepam.  The EEG was done with the patient awake and drowsy.  During periods of maximal wakefulness she had a  very low amplitude 10 cps alpha rhythm that was superimposed on a background of beta activities.  These beta activities were higher amplitude over the left paramedian chain.  Photic stimulation produced a symmetric driving response.  Hyperventilation was not performed.  The patient did because drowsy as evidenced by the onset of diffuse theta and delta activities.  The activities were of higher amplitude across the left paramedian region.  There were also frontally dominant beta activities that were seen of higher amplitude across the left hemisphere.  Clinical Interpretation:  This routine EEG done with the patient awake and drowsy is abnormal.  Higher amplitudes of beta activities over the left paramedian region suggest an underlying skull defect, likely related to her recent craniotomy.  There were not interictal epileptiform discharges seen.  Lupita Raider Modesto Charon, MD [facility], [place]

## 2011-08-22 LAB — GLUCOSE, CAPILLARY
Glucose-Capillary: 137 mg/dL — ABNORMAL HIGH (ref 70–99)
Glucose-Capillary: 169 mg/dL — ABNORMAL HIGH (ref 70–99)
Glucose-Capillary: 100 mg/dL — ABNORMAL HIGH (ref 70–99)

## 2011-08-22 MED ORDER — LEVETIRACETAM ER 500 MG PO TB24: 500.0000 mg | ORAL_TABLET | Freq: Two times a day (BID) | ORAL | Status: DC

## 2011-08-22 NOTE — Discharge Summary (Signed)
Physician Discharge Summary  Patient ID: Dawn Salas MRN: [id number] DOB/AGE: 69/12/1940 69 y.o.  Admit date: 08/19/2011 Discharge date: 08/22/2011  Admission Diagnoses: Partial seizure, meningioma  Discharge Diagnoses: The same Active Problems:  Partial seizure disorder   Discharged Condition: good  Hospital Course: The patient recently underwent a bifrontal craniotomy for resection of right bifrontal meningioma. She did well and was subsequently discharged. She had brief periods of aphasia. She was readmitted and a head scan looked good. She was started on Her. We asked the neurologist see the patient. She had EEG which  was abnormal but did not demonstrate any clear seizure activity.  The patient was observed. She had no further seizure activity. And by 08/22/2011 the patient was requested discharge home. The patient was discharged to home and given oral and written discharge instructions. All her questions were answered. She was instructed to not drive or operate heavy machinery etc. until further notice.  Consults: Neurology Significant Diagnostic Studies: Head CT scan Treatments: Anticonvulsants Discharge Exam: Blood pressure 124/78, pulse 56, temperature 98.3 F (36.8 C), temperature source Oral, resp. rate 18, height 5\' 7"  (1.702 m), weight 100.1 kg (220 lb 10.9 oz), SpO2 100.00%. The patient is alert and pleasant. Her speech is normal, her strength is normal. Her incision is healing well. I removed his staples and sutures.  Disposition: Home  Discharge Orders    Future Orders Please Complete By Expires   Diet - low sodium heart healthy      Increase activity slowly      Discharge instructions      Comments:   Call [phone] for followup appointment. Do not drive or operate heavy equipment until further notice.   No dressing needed      Call MD for:  temperature >100.4      Call MD for:  persistant nausea and vomiting      Call MD for:  severe uncontrolled pain       Call MD for:  redness, tenderness, or signs of infection (pain, swelling, redness, odor or green/yellow discharge around incision site)      Call MD for:  difficulty breathing, headache or visual disturbances      Call MD for:  hives      Call MD for:  persistant dizziness or light-headedness      Call MD for:  extreme fatigue        Medication List  As of 08/22/2011 10:09 AM   STOP taking these medications         TYLENOL ARTHRITIS PAIN PO         TAKE these medications         albuterol 90 MCG/ACT inhaler   Commonly known as: PROVENTIL,VENTOLIN   Inhale 2 puffs into the lungs every 6 (six) hours as needed. For shortness of breath      cetirizine 10 MG tablet   Commonly known as: ZYRTEC   Take 10 mg by mouth daily as needed. For allergies      diazepam 5 MG tablet   Commonly known as: VALIUM   Take 2.5 mg by mouth daily as needed. For anxiety      diclofenac sodium 1 % Gel   Commonly known as: VOLTAREN   Apply 1 application topically 2 (two) times daily.      DSS 100 MG Caps   Take 100 mg by mouth 2 (two) times daily.      fluticasone 50 MCG/ACT nasal spray   Commonly known  as: FLONASE   Place 2 sprays into the nose daily as needed. For allergies, stuffy head      furosemide 40 MG tablet   Commonly known as: LASIX   Take 20 mg by mouth daily as needed. For fluid      GERITOL COMPLETE PO   Take 1 tablet by mouth daily.      levETIRAcetam 500 MG 24 hr tablet   Commonly known as: KEPPRA XR   Take 1 tablet (500 mg total) by mouth 2 (two) times daily.      LIVALO 1 MG Tabs   Generic drug: Pitavastatin Calcium   Take 1 tablet by mouth every 3 (three) days.      losartan-hydrochlorothiazide 100-12.5 MG per tablet   Commonly known as: HYZAAR   Take 1 tablet by mouth daily.      magnesium oxide 400 (241.3 MG) MG tablet   Commonly known as: MAG-OX   Take 1,200 mg by mouth daily.      meclizine 25 MG tablet   Commonly known as: ANTIVERT   Take 25 mg by mouth  every 6 (six) hours as needed. For vertigo      metFORMIN 500 MG tablet   Commonly known as: GLUCOPHAGE   Take 1 tablet (500 mg total) by mouth 2 (two) times daily with a meal.      metoprolol succinate 100 MG 24 hr tablet   Commonly known as: TOPROL-XL   Take 1 tablet (100 mg total) by mouth daily.      nabumetone 500 MG tablet   Commonly known as: RELAFEN   Take 500 mg by mouth daily as needed. For Arthritis pain      oxyCODONE-acetaminophen 10-325 MG per tablet   Commonly known as: PERCOCET   Take 1 tablet by mouth every 4 (four) hours as needed. For pain      potassium chloride 10 MEQ tablet   Commonly known as: K-DUR   Take 10 mEq by mouth daily. May take when pt takes Lasix prn      RABEprazole 20 MG tablet   Commonly known as: ACIPHEX   Take 1 tablet (20 mg total) by mouth daily.      solifenacin 5 MG tablet   Commonly known as: VESICARE   Take 1 tablet (5 mg total) by mouth daily.      valACYclovir 1000 MG tablet   Commonly known as: VALTREX   Take 1,000 mg by mouth 2 (two) times daily as needed. For mouth blisters, and nose blisters      VITAMIN B12 PO   Take 10 mLs by mouth daily. OTC      WELCHOL 3.75 G Pack   Generic drug: Colesevelam HCl   Take 1 each by mouth daily.             SignedTressie Stalker D 08/22/2011, 10:09 AM

## 2011-10-12 ENCOUNTER — Other Ambulatory Visit: Payer: Self-pay | Admitting: *Deleted

## 2011-10-12 DIAGNOSIS — K219 Gastro-esophageal reflux disease without esophagitis: Secondary | ICD-10-CM

## 2011-10-12 MED ORDER — SOLIFENACIN SUCCINATE 5 MG PO TABS: 5.0000 mg | ORAL_TABLET | Freq: Every day | ORAL | Status: DC

## 2011-10-15 ENCOUNTER — Other Ambulatory Visit: Payer: Self-pay | Admitting: Family Medicine

## 2011-10-15 DIAGNOSIS — Z1231 Encounter for screening mammogram for malignant neoplasm of breast: Secondary | ICD-10-CM

## 2011-10-17 ENCOUNTER — Ambulatory Visit (INDEPENDENT_AMBULATORY_CARE_PROVIDER_SITE_OTHER): Payer: Medicare Other | Admitting: Family Medicine

## 2011-10-17 ENCOUNTER — Encounter: Payer: Self-pay | Admitting: Family Medicine

## 2011-10-17 VITALS — BP 132/82 | Temp 97.8°F | Wt 217.0 lb

## 2011-10-17 DIAGNOSIS — I10 Essential (primary) hypertension: Secondary | ICD-10-CM

## 2011-10-17 DIAGNOSIS — K219 Gastro-esophageal reflux disease without esophagitis: Secondary | ICD-10-CM

## 2011-10-17 DIAGNOSIS — Z23 Encounter for immunization: Secondary | ICD-10-CM

## 2011-10-17 DIAGNOSIS — E119 Type 2 diabetes mellitus without complications: Secondary | ICD-10-CM

## 2011-10-17 DIAGNOSIS — G40109 Localization-related (focal) (partial) symptomatic epilepsy and epileptic syndromes with simple partial seizures, not intractable, without status epilepticus: Secondary | ICD-10-CM

## 2011-10-17 NOTE — Patient Instructions (Addendum)
Set up eye exam soon Go back to whole tablet of Losartan if BP consistently > 130/80

## 2011-10-17 NOTE — Progress Notes (Signed)
Subjective:    Patient ID: Dawn Salas, female    DOB: August 14, 1940, 71 y.o.   MRN: [id number]  HPI  Six-month followup. Patient had removal of a couple of meningiomas frontal lobes back in July. Surgery generally went well but she had partial seizure following surgery. She was placed on Keppra and this was just discontinued a week ago. She's not had any recurrent seizures. Generally feels well. Good appetite. No headaches. No dizziness.  Blood pressure treated with losartan HCTZ. She had couple episodes of mild lightheadedness and dizziness several weeks ago and reduced this herself to half tablet daily. Recent blood pressures at home mostly 120s to 130s systolic and 80s diastolic. Patient saw another physician recently and had A1C 6.6%. Creatinine 1.2 with GFR 49. Compliant with medications. Reflux well controlled with AcipHex. No flu vaccine yet.  Past Medical History  Diagnosis Date  . Allergy   . Asthma   . Hyperlipidemia   . Hypertension   . Depression   . GERD (gastroesophageal reflux disease)   . Peptic ulcer disease   . Palpitations   . Multiple meningiomas of spine and brain   . Hyperglycemia   . Obstructive sleep apnea     STUDY AT [facility] DOES NOT USE C PAP  . Diabetes mellitus     TYPE ONE  . Hyperthyroidism     PT HAD BIPOSY -NEGATIVE.Marland Kitchen AND NOW JUST GETS CHECKED EACH YEAR .Marland Kitchen 2013 LAST TEST   SEES DR. [name].   Marland Kitchen Arthritis    Past Surgical History  Procedure Date  . Abdominal hysterectomy   . Tonsillectomy   . Tubal ligation   . Right knee arthroscopy     x2  . Right foot bunectomy   . Right shoulder rotator cuff   . L4-l5 posterior la   . C5-c6 neck fusion   . Left foot plantar and hammertoe   . Brain meningioma excision 8/10  . Back surgery     L SPIN 2003   NECK 2004  . Dilation and curettage of uterus     YEARS AGO  . Craniotomy 08/13/2011    Procedure: CRANIOTOMY TUMOR EXCISION;  Surgeon: Cristi Loron, MD;  Location: [facility];  Service:  Neurosurgery;  Laterality: Bilateral;  Bifrontal craniotomy for tumor    reports that she has never smoked. She does not have any smokeless tobacco history on file. She reports that she does not drink alcohol or use illicit drugs. family history includes Anesthesia problems in her daughter; Arthritis in her other; Cancer in her mother; Diabetes in her other; Heart disease (age of onset:33) in her father; Hyperlipidemia in her other; and Hypertension in her other. Allergies  Allergen Reactions  . Flagyl (Metronidazole) Other (See Comments)    Caused increased heart rate  . Aspirin Effervescent Swelling    Alka-Seltzer gel caps- sore mouth, face swollen, turned hands white  . Morphine Sulfate     REACTION: hives  . Penicillins     REACTION: rash      Review of Systems  Constitutional: Negative for fatigue.  Eyes: Negative for visual disturbance.  Respiratory: Negative for cough, chest tightness, shortness of breath and wheezing.   Cardiovascular: Negative for chest pain, palpitations and leg swelling.  Genitourinary: Negative for dysuria.  Neurological: Negative for dizziness, seizures, syncope, weakness, light-headedness and headaches.  Psychiatric/Behavioral: Negative for confusion and dysphoric mood.       Objective:   Physical Exam  Constitutional: She is oriented to  person, place, and time. She appears well-developed and well-nourished.  HENT:       Large horizontal scar frontal scalp well healed.  Eyes: Pupils are equal, round, and reactive to light.  Neck: Neck supple. No thyromegaly present.  Cardiovascular: Normal rate and regular rhythm.   Pulmonary/Chest: Effort normal and breath sounds normal. No respiratory distress. She has no wheezes. She has no rales.  Musculoskeletal: She exhibits no edema.  Neurological: She is alert and oriented to person, place, and time. No cranial nerve deficit.          Assessment & Plan:  #1 type 2 diabetes. Recent A1c 6.6%. Try to  establish more consistent exercise now she is recovered from her surgery. She is encouraged to continue every six-month A1c. Schedule eye exam #2 history of meningiomas of the brain. Recent surgery went well. #3 hypertension. Generally well-controlled. Go back to full tablet of losartan if blood pressures consistently greater than 130/80  #4 health maintenance. Flu vaccine recommended and given #5 GERD well controlled. Continue AcipHex. She's tried other PPIs in the past without good control

## 2011-11-22 LAB — HM DIABETES EYE EXAM

## 2011-11-23 ENCOUNTER — Ambulatory Visit (HOSPITAL_COMMUNITY)
Admission: RE | Admit: 2011-11-23 | Discharge: 2011-11-23 | Disposition: A | Discharge status: Home / Self Care | Payer: Medicare Other | Source: Ambulatory Visit | Attending: Family Medicine | Admitting: Family Medicine

## 2011-11-23 ENCOUNTER — Encounter: Payer: Self-pay | Admitting: Family Medicine

## 2011-11-23 DIAGNOSIS — Z1231 Encounter for screening mammogram for malignant neoplasm of breast: Secondary | ICD-10-CM | POA: Insufficient documentation

## 2011-12-10 ENCOUNTER — Encounter: Payer: Self-pay | Admitting: Family Medicine

## 2011-12-10 ENCOUNTER — Ambulatory Visit (INDEPENDENT_AMBULATORY_CARE_PROVIDER_SITE_OTHER): Payer: Medicare Other | Admitting: Family Medicine

## 2011-12-10 ENCOUNTER — Telehealth: Payer: Self-pay | Admitting: Family Medicine

## 2011-12-10 VITALS — BP 130/82 | HR 65 | Temp 97.5°F | Wt 224.0 lb

## 2011-12-10 DIAGNOSIS — K649 Unspecified hemorrhoids: Secondary | ICD-10-CM

## 2011-12-10 MED ORDER — HYDROCORTISONE 2.5 % RE CREA: TOPICAL_CREAM | Freq: Two times a day (BID) | RECTAL | Status: DC

## 2011-12-10 NOTE — Patient Instructions (Addendum)
-  use medication provided as directed -fiber supplement every day to keep stools soft with no straining or constipation -regular exercise -follow up with your doctor in 1-2 weeks or sooner if worsening   Hemorrhoids Hemorrhoids are enlarged (dilated) veins around the rectum. There are 2 types of hemorrhoids, and the type of hemorrhoid is determined by its location. Internal hemorrhoids occur in the veins just inside the rectum.They are usually not painful, but they may bleed.However, they may poke through to the outside and become irritated and painful. External hemorrhoids involve the veins outside the anus and can be felt as a painful swelling or hard lump near the anus.They are often itchy and may crack and bleed. Sometimes clots will form in the veins. This makes them swollen and painful. These are called thrombosed hemorrhoids. CAUSES Causes of hemorrhoids include:  Pregnancy. This increases the pressure in the hemorrhoidal veins.  Constipation.  Straining to have a bowel movement.  Obesity.  Heavy lifting or other activity that caused you to strain. TREATMENT Most of the time hemorrhoids improve in 1 to 2 weeks. However, if symptoms do not seem to be getting better or if you have a lot of rectal bleeding, your caregiver may perform a procedure to help make the hemorrhoids get smaller or remove them completely.Possible treatments include:  Rubber band ligation. A rubber band is placed at the base of the hemorrhoid to cut off the circulation.  Sclerotherapy. A chemical is injected to shrink the hemorrhoid.  Infrared light therapy. Tools are used to burn the hemorrhoid.  Hemorrhoidectomy. This is surgical removal of the hemorrhoid. HOME CARE INSTRUCTIONS   Increase fiber in your diet. Ask your caregiver about using fiber supplements.  Drink enough water and fluids to keep your urine clear or pale yellow.  Exercise regularly.  Go to the bathroom when you have the urge to  have a bowel movement. Do not wait.  Avoid straining to have bowel movements.  Keep the anal area dry and clean.  Only take over-the-counter or prescription medicines for pain, discomfort, or fever as directed by your caregiver. If your hemorrhoids are thrombosed:  Take warm sitz baths for 20 to 30 minutes, 3 to 4 times per day.  If the hemorrhoids are very tender and swollen, place ice packs on the area as tolerated. Using ice packs between sitz baths may be helpful. Fill a plastic bag with ice. Place a towel between the bag of ice and your skin.  Medicated creams and suppositories may be used or applied as directed.  Do not use a donut-shaped pillow or sit on the toilet for long periods. This increases blood pooling and pain. SEEK MEDICAL CARE IF:   You have increasing pain and swelling that is not controlled with your medicine.  You have uncontrolled bleeding.  You have difficulty or you are unable to have a bowel movement.  You have pain or inflammation outside the area of the hemorrhoids.  You have chills or an oral temperature above 102 F (38.9 C). MAKE SURE YOU:   Understand these instructions.  Will watch your condition.  Will get help right away if you are not doing well or get worse. Document Released: 01/06/2000 Document Revised: 04/02/2011 Document Reviewed: 12/19/2009 [facility] Patient Information 2013 [place], [place].

## 2011-12-10 NOTE — Telephone Encounter (Signed)
Patient Information:  Caller Name: [place]  Phone: [phone]  Patient: Dawn Salas, Dawn Salas  Gender: Female  DOB: Mar 27, 1940  Age: 71 Years  PCP: Evelena Peat (Family Practice)   Symptoms  Reason For Call & Symptoms: hemorrhoid bleeding this am  Reviewed Health History In EMR: Yes  Reviewed Medications In EMR: Yes  Reviewed Allergies In EMR: Yes  Date of Onset of Symptoms: 12/10/2011  Treatments Tried: witch hazel to the area  Treatments Tried Worked: No  Guideline(s) Used:  Rectal Bleeding  Rectal Symptoms  Disposition Per Guideline:   See Today or Tomorrow in Office  Reason For Disposition Reached:   Patient wants to be seen  Advice Given:  N/A  Office Follow Up:  Does the office need to follow up with this patient?: Yes  Instructions For The Office: Please make an appt. for patient today, she can be reached at the # [phone]

## 2011-12-10 NOTE — Telephone Encounter (Signed)
Appt made for 11/18.

## 2011-12-10 NOTE — Progress Notes (Signed)
Chief Complaint  Patient presents with  . Hemorrhoids    bleeding; started last Thursday    HPI:  Hemorrhoids: -hx of hemorrhoids in the past -has had symptoms in the last week - feeling like sitting on something and feels pain in rectum, can feel the hemorrhoid, bleeding on TP and on panty liner -has been using witch hazel and preparation H -has had issues with constipation, straining -does not take anything for constipation -no change in bowel, nausea, vomiting, fevers, malaise  ROS: See pertinent positives and negatives per HPI.  Past Medical History  Diagnosis Date  . Allergy   . Asthma   . Hyperlipidemia   . Hypertension   . Depression   . GERD (gastroesophageal reflux disease)   . Peptic ulcer disease   . Palpitations   . Multiple meningiomas of spine and brain   . Hyperglycemia   . Obstructive sleep apnea     [facility]UDY AT [facility] DOES NOT USE C PAP  . Diabetes mellitus     TYPE ONE  . Hyperthyroidism     PT HAD BIPOSY -NEGATIVE.Marland Kitchen AND NOW JUST GETS CHECKED EACH YEAR .Marland Kitchen 2013 LAST TEST   SEES DR. [name].   Marland Kitchen Arthritis     Family History  Problem Relation Age of Onset  . Cancer Mother     ovarian  . Heart disease Father 65  . Arthritis Other   . Hyperlipidemia Other   . Hypertension Other   . Diabetes Other   . Anesthesia problems Daughter     NAUSEA AND VOMITING POST OP    History   Social History  . Marital Status: Married    Spouse Name: N/A    Number of Children: N/A  . Years of Education: N/A   Social History Main Topics  . Smoking status: Never Smoker   . Smokeless tobacco: None  . Alcohol Use: No  . Drug Use: No  . Sexually Active: No   Other Topics Concern  . None   Social History Narrative  . None    Current outpatient prescriptions:albuterol (PROVENTIL,VENTOLIN) 90 MCG/ACT inhaler, Inhale 2 puffs into the lungs every 6 (six) hours as needed. For shortness of breath, Disp: , Rfl: ;  cetirizine (ZYRTEC) 10 MG tablet, Take 10 mg by mouth  daily as needed. For allergies, Disp: , Rfl: ;  Colesevelam HCl (WELCHOL) 3.75 G PACK, Take 1 each by mouth daily., Disp: , Rfl: ;  Cyanocobalamin (VITAMIN B12 PO), Take 10 mLs by mouth daily. OTC, Disp: , Rfl:  diazepam (VALIUM) 5 MG tablet, Take 2.5 mg by mouth daily as needed. For anxiety, Disp: , Rfl: ;  diclofenac sodium (VOLTAREN) 1 % GEL, Apply 1 application topically 2 (two) times daily., Disp: , Rfl: ;  fluticasone (FLONASE) 50 MCG/ACT nasal spray, Place 2 sprays into the nose daily as needed. For allergies, stuffy head, Disp: , Rfl: ;  furosemide (LASIX) 40 MG tablet, Take 20 mg by mouth daily as needed. For fluid, Disp: , Rfl:  Iron-Vitamins (GERITOL COMPLETE PO), Take 1 tablet by mouth daily., Disp: , Rfl: ;  losartan-hydrochlorothiazide (HYZAAR) 100-12.5 MG per tablet, Take 1 tablet by mouth daily., Disp: 90 tablet, Rfl: 3;  magnesium oxide (MAG-OX) 400 (241.3 MG) MG tablet, Take 1,200 mg by mouth daily., Disp: , Rfl: ;  metFORMIN (GLUCOPHAGE) 500 MG tablet, Take 1 tablet (500 mg total) by mouth 2 (two) times daily with a meal., Disp: 180 tablet, Rfl: 3 metoprolol succinate (TOPROL XL) 100  MG 24 hr tablet, Take 1 tablet (100 mg total) by mouth daily., Disp: 90 tablet, Rfl: 3;  Pitavastatin Calcium (LIVALO) 1 MG TABS, Take 1 tablet by mouth every 3 (three) days., Disp: , Rfl: ;  potassium chloride (K-DUR) 10 MEQ tablet, Take 10 mEq by mouth daily. May take when pt takes Lasix prn, Disp: , Rfl: ;  RABEprazole (ACIPHEX) 20 MG tablet, Take 1 tablet (20 mg total) by mouth daily., Disp: 90 tablet, Rfl: 3 solifenacin (VESICARE) 5 MG tablet, Take 1 tablet (5 mg total) by mouth daily., Disp: 90 tablet, Rfl: 3;  valACYclovir (VALTREX) 1000 MG tablet, Take 1,000 mg by mouth 2 (two) times daily as needed. For mouth blisters, and nose blisters, Disp: , Rfl: ;  hydrocortisone (ANUSOL-HC) 2.5 % rectal cream, Place rectally 2 (two) times daily., Disp: 30 g, Rfl: 0 [DISCONTINUED] venlafaxine (EFFEXOR XR) 37.5 MG 24  hr capsule, Take 1 capsule (37.5 mg total) by mouth daily., Disp: 30 capsule, Rfl: 0  EXAM:  Filed Vitals:   12/10/11 1336  BP: 130/82  Pulse: 65  Temp: 97.5 F (36.4 C)    There is no height on file to calculate BMI.  GENERAL: vitals reviewed and listed above, alert, oriented, appears well hydrated and in no acute distress  HEENT: atraumatic, conjunttiva clear, no obvious abnormalities on inspection of external nose and ears  NECK: no obvious masses on inspection  RECTUM: bleeding hemorrhoid  MS: moves all extremities without noticeable abnormality  PSYCH: pleasant and cooperative, no obvious depression or anxiety  ASSESSMENT AND PLAN:  Discussed the following assessment and plan:  1. Hemorrhoid  hydrocortisone (ANUSOL-HC) 2.5 % rectal cream    -Patient advised to return or notify a doctor immediately if symptoms worsen or persist or new concerns arise.  Patient Instructions   -use medication provided as directed -fiber supplement every day to keep stools soft with no straining or constipation -regular exercise -follow up with your doctor in 1-2 weeks or sooner if worsening   Hemorrhoids Hemorrhoids are enlarged (dilated) veins around the rectum. There are 2 types of hemorrhoids, and the type of hemorrhoid is determined by its location. Internal hemorrhoids occur in the veins just inside the rectum.They are usually not painful, but they may bleed.However, they may poke through to the outside and become irritated and painful. External hemorrhoids involve the veins outside the anus and can be felt as a painful swelling or hard lump near the anus.They are often itchy and may crack and bleed. Sometimes clots will form in the veins. This makes them swollen and painful. These are called thrombosed hemorrhoids. CAUSES Causes of hemorrhoids include:  Pregnancy. This increases the pressure in the hemorrhoidal veins.  Constipation.  Straining to have a bowel  movement.  Obesity.  Heavy lifting or other activity that caused you to strain. TREATMENT Most of the time hemorrhoids improve in 1 to 2 weeks. However, if symptoms do not seem to be getting better or if you have a lot of rectal bleeding, your caregiver may perform a procedure to help make the hemorrhoids get smaller or remove them completely.Possible treatments include:  Rubber band ligation. A rubber band is placed at the base of the hemorrhoid to cut off the circulation.  Sclerotherapy. A chemical is injected to shrink the hemorrhoid.  Infrared light therapy. Tools are used to burn the hemorrhoid.  Hemorrhoidectomy. This is surgical removal of the hemorrhoid. HOME CARE INSTRUCTIONS   Increase fiber in your diet. Ask your caregiver about  using fiber supplements.  Drink enough water and fluids to keep your urine clear or pale yellow.  Exercise regularly.  Go to the bathroom when you have the urge to have a bowel movement. Do not wait.  Avoid straining to have bowel movements.  Keep the anal area dry and clean.  Only take over-the-counter or prescription medicines for pain, discomfort, or fever as directed by your caregiver. If your hemorrhoids are thrombosed:  Take warm sitz baths for 20 to 30 minutes, 3 to 4 times per day.  If the hemorrhoids are very tender and swollen, place ice packs on the area as tolerated. Using ice packs between sitz baths may be helpful. Fill a plastic bag with ice. Place a towel between the bag of ice and your skin.  Medicated creams and suppositories may be used or applied as directed.  Do not use a donut-shaped pillow or sit on the toilet for long periods. This increases blood pooling and pain. SEEK MEDICAL CARE IF:   You have increasing pain and swelling that is not controlled with your medicine.  You have uncontrolled bleeding.  You have difficulty or you are unable to have a bowel movement.  You have pain or inflammation outside the  area of the hemorrhoids.  You have chills or an oral temperature above 102 F (38.9 C). MAKE SURE YOU:   Understand these instructions.  Will watch your condition.  Will get help right away if you are not doing well or get worse. Document Released: 01/06/2000 Document Revised: 04/02/2011 Document Reviewed: 12/19/2009 [facility] Patient Information 2013 [place], [place], [place]

## 2011-12-18 ENCOUNTER — Ambulatory Visit (INDEPENDENT_AMBULATORY_CARE_PROVIDER_SITE_OTHER): Payer: Medicare Other | Admitting: Family Medicine

## 2011-12-18 ENCOUNTER — Encounter: Payer: Self-pay | Admitting: Family Medicine

## 2011-12-18 VITALS — BP 120/72 | Temp 98.1°F | Wt 228.0 lb

## 2011-12-18 DIAGNOSIS — K644 Residual hemorrhoidal skin tags: Secondary | ICD-10-CM

## 2011-12-18 NOTE — Patient Instructions (Addendum)

## 2011-12-18 NOTE — Progress Notes (Signed)
  Subjective:    Patient ID: Dawn Salas, female    DOB: Oct 13, 1940, 71 y.o.   MRN: [id number]  HPI  Patient seen for followup external hemorrhoid. Last week she had some mild pain and some associated bleeding. She's been doing sitz baths and topical steroid cream and states that symptomatically improved this time. Check colonoscopy 2011. She has occasional straining but has been conscious of drinking more fluids and taking in more fiber recently. No bloody stools.  No diarrhea.  Past Medical History  Diagnosis Date  . Allergy   . Asthma   . Hyperlipidemia   . Hypertension   . Depression   . GERD (gastroesophageal reflux disease)   . Peptic ulcer disease   . Palpitations   . Multiple meningiomas of spine and brain   . Hyperglycemia   . Obstructive sleep apnea     STUDY AT [facility] DOES NOT USE C PAP  . Diabetes mellitus     TYPE ONE  . Hyperthyroidism     PT HAD BIPOSY -NEGATIVE.Marland Kitchen AND NOW JUST GETS CHECKED EACH YEAR .Marland Kitchen 2013 LAST TEST   SEES DR. [name].   Marland Kitchen Arthritis    Past Surgical History  Procedure Date  . Abdominal hysterectomy   . Tonsillectomy   . Tubal ligation   . Right knee arthroscopy     x2  . Right foot bunectomy   . Right shoulder rotator cuff   . L4-l5 posterior la   . C5-c6 neck fusion   . Left foot plantar and hammertoe   . Brain meningioma excision 8/10  . Back surgery     L SPIN 2003   NECK 2004  . Dilation and curettage of uterus     YEARS AGO  . Craniotomy 08/13/2011    Procedure: CRANIOTOMY TUMOR EXCISION;  Surgeon: Cristi Loron, MD;  Location: [facility];  Service: Neurosurgery;  Laterality: Bilateral;  Bifrontal craniotomy for tumor    reports that she has never smoked. She does not have any smokeless tobacco history on file. She reports that she does not drink alcohol or use illicit drugs. family history includes Anesthesia problems in her daughter; Arthritis in her other; Cancer in her mother; Diabetes in her other; Heart disease (age of  onset:33) in her father; Hyperlipidemia in her other; and Hypertension in her other. Allergies  Allergen Reactions  . Flagyl (Metronidazole) Other (See Comments)    Caused increased heart rate  . Aspirin Effervescent Swelling    Alka-Seltzer gel caps- sore mouth, face swollen, turned hands white  . Morphine Sulfate     REACTION: hives  . Penicillins     REACTION: rash      Review of Systems  Gastrointestinal: Negative for abdominal pain, diarrhea and blood in stool.       Objective:   Physical Exam  Constitutional: She appears well-developed and well-nourished.  Cardiovascular: Normal rate and regular rhythm.   Pulmonary/Chest: Effort normal and breath sounds normal. No respiratory distress. She has no wheezes. She has no rales.  Genitourinary:       Rectals exam reveals nonthrombosed hemorrhoid around 11:00 position. Nontender. No bleeding          Assessment & Plan:  External hemorrhoid. No bleeding at this point and no significant pain. Continue sitz baths and measures to reduce constipation. Continue topical steroid cream. She does not have evidence for thrombosis

## 2012-01-14 ENCOUNTER — Other Ambulatory Visit: Payer: Self-pay | Admitting: Family Medicine

## 2012-01-14 DIAGNOSIS — K219 Gastro-esophageal reflux disease without esophagitis: Secondary | ICD-10-CM

## 2012-01-14 MED ORDER — GLUCOSE BLOOD VI STRP: ORAL_STRIP | Status: DC

## 2012-01-14 MED ORDER — RABEPRAZOLE SODIUM 20 MG PO TBEC: 20.0000 mg | DELAYED_RELEASE_TABLET | Freq: Every day | ORAL | Status: DC

## 2012-01-14 NOTE — Telephone Encounter (Signed)
Pt needs test strips for easy max glucometer fax into [facility] [phone].Pt also needs refill on  aciphex 20 mg call into the drug store [phone].

## 2012-02-05 ENCOUNTER — Other Ambulatory Visit: Payer: Self-pay | Admitting: *Deleted

## 2012-02-05 DIAGNOSIS — K219 Gastro-esophageal reflux disease without esophagitis: Secondary | ICD-10-CM

## 2012-02-05 MED ORDER — RABEPRAZOLE SODIUM 20 MG PO TBEC: 20.0000 mg | DELAYED_RELEASE_TABLET | Freq: Every day | ORAL | Status: DC

## 2012-02-24 ENCOUNTER — Other Ambulatory Visit: Payer: Self-pay | Admitting: Family Medicine

## 2012-03-04 ENCOUNTER — Other Ambulatory Visit: Payer: Self-pay | Admitting: *Deleted

## 2012-03-04 MED ORDER — METOPROLOL SUCCINATE ER 100 MG PO TB24: 100.0000 mg | ORAL_TABLET | Freq: Every day | ORAL | Status: DC

## 2012-04-15 ENCOUNTER — Encounter: Payer: Self-pay | Admitting: Family Medicine

## 2012-04-15 ENCOUNTER — Ambulatory Visit (INDEPENDENT_AMBULATORY_CARE_PROVIDER_SITE_OTHER): Payer: Medicare Other | Admitting: Family Medicine

## 2012-04-15 VITALS — BP 120/68 | Temp 98.0°F | Wt 223.0 lb

## 2012-04-15 DIAGNOSIS — E119 Type 2 diabetes mellitus without complications: Secondary | ICD-10-CM

## 2012-04-15 DIAGNOSIS — I10 Essential (primary) hypertension: Secondary | ICD-10-CM

## 2012-04-15 DIAGNOSIS — IMO0001 Reserved for inherently not codable concepts without codable children: Secondary | ICD-10-CM

## 2012-04-15 DIAGNOSIS — E785 Hyperlipidemia, unspecified: Secondary | ICD-10-CM

## 2012-04-15 DIAGNOSIS — R6889 Other general symptoms and signs: Secondary | ICD-10-CM

## 2012-04-15 DIAGNOSIS — K219 Gastro-esophageal reflux disease without esophagitis: Secondary | ICD-10-CM

## 2012-04-15 LAB — LIPID PANEL
Cholesterol: 296 mg/dL — ABNORMAL HIGH (ref 0–200)
HDL: 82.4 mg/dL (ref >39.00)
Total CHOL/HDL Ratio: 4
Triglycerides: 95 mg/dL (ref 0.0–149.0)
VLDL: 19 mg/dL (ref 0.0–40.0)

## 2012-04-15 LAB — BASIC METABOLIC PANEL
BUN: 13 mg/dL (ref 6–23)
CO2: 29 mEq/L (ref 19–32)
Calcium: 9.3 mg/dL (ref 8.4–10.5)
Chloride: 105 mEq/L (ref 96–112)
Creatinine, Ser: 1.2 mg/dL (ref 0.4–1.2)
GFR: 57.22 mL/min — ABNORMAL LOW (ref >60.00)
Glucose, Bld: 128 mg/dL — ABNORMAL HIGH (ref 70–99)
Potassium: 4.2 mEq/L (ref 3.5–5.1)
Sodium: 143 mEq/L (ref 135–145)

## 2012-04-15 LAB — LDL CHOLESTEROL, DIRECT: Direct LDL: 195.4 mg/dL

## 2012-04-15 LAB — HEMOGLOBIN A1C: Hgb A1c MFr Bld: 6.9 % — ABNORMAL HIGH (ref 4.6–6.5)

## 2012-04-15 LAB — TSH: TSH: 1.26 u[IU]/mL (ref 0.35–5.50)

## 2012-04-15 MED ORDER — ACIPHEX 20 MG PO TBEC: DELAYED_RELEASE_TABLET | ORAL | Status: DC

## 2012-04-15 NOTE — Progress Notes (Signed)
Subjective:    Patient ID: Dawn Salas, female    DOB: October 11, 1940, 72 y.o.   MRN: [id number]  HPI Chronic problems include history of hypertension, hyperlipidemia, reported mild intermittent asthma, GERD, meningiomas, obstructive sleep apnea. She's gotten her care through several different offices. Currently seeing a cardiologist over in [facility] and also seen endocrinologist in [facility] regarding "thyroid nodules". These are being followed apparently by ultrasound. She does not have any known history of hypo-or hyperthyroidism. She has had some recent cold intolerance-but also occasional "hot flashes" at night.  Hypertension treated with metoprolol and losartan HCTZ. Blood pressure stable. No dizziness. No headaches. No chest pains.  Type 2 diabetes. Blood sugars well controlled by home readings. No hyperglycemic symptoms. No symptoms of hyperglycemia. Last A1c 6.6% 6 months ago  Patient complains some diffuse bilateral lower extremity myalgias. Has been intolerant of statins in the past. Takes WelChol for hyperlipidemia. Question occasional weakness lower extremities. No low back pain. No posterior leg pains. No numbness.   History of GERD. She has not tolerated non-generics and requesting non-generic AcipHex which seems to be the only thing that works well for her. No recent dysphagia.  Past Medical History  Diagnosis Date  . Allergy   . Asthma   . Hyperlipidemia   . Hypertension   . Depression   . GERD (gastroesophageal reflux disease)   . Peptic ulcer disease   . Palpitations   . Multiple meningiomas of spine and brain   . Hyperglycemia   . Obstructive sleep apnea     STUDY AT [facility] DOES NOT USE C PAP  . Diabetes mellitus     TYPE ONE  . Hyperthyroidism     PT HAD BIPOSY -NEGATIVE.Marland Kitchen AND NOW JUST GETS CHECKED EACH YEAR .Marland Kitchen 2013 LAST TEST   SEES DR. [name].   Marland Kitchen Arthritis    Past Surgical History  Procedure Laterality Date  . Abdominal hysterectomy    . Tonsillectomy     . Tubal ligation    . Right knee arthroscopy      x2  . Right foot bunectomy    . Right shoulder rotator cuff    . L4-l5 posterior la    . C5-c6 neck fusion    . Left foot plantar and hammertoe    . Brain meningioma excision  8/10  . Back surgery      L SPIN 2003   NECK 2004  . Dilation and curettage of uterus      YEARS AGO  . Craniotomy  08/13/2011    Procedure: CRANIOTOMY TUMOR EXCISION;  Surgeon: Cristi Loron, MD;  Location: [facility];  Service: Neurosurgery;  Laterality: Bilateral;  Bifrontal craniotomy for tumor    reports that she has never smoked. She does not have any smokeless tobacco history on file. She reports that she does not drink alcohol or use illicit drugs. family history includes Anesthesia problems in her daughter; Arthritis in her other; Cancer in her mother; Diabetes in her other; Heart disease (age of onset: 73) in her father; Hyperlipidemia in her other; and Hypertension in her other. Allergies  Allergen Reactions  . Flagyl (Metronidazole) Other (See Comments)    Caused increased heart rate  . Aspirin Effervescent Swelling    Alka-Seltzer gel caps- sore mouth, face swollen, turned hands white  . Morphine Sulfate     REACTION: hives  . Penicillins     REACTION: rash      Review of Systems  Constitutional:  Negative for fever, chills, appetite change and unexpected weight change.  Respiratory: Negative for cough and shortness of breath.   Cardiovascular: Negative for chest pain, palpitations and leg swelling.  Gastrointestinal: Negative for abdominal pain.  Endocrine: Negative for polyuria.  Genitourinary: Negative for dysuria.  Musculoskeletal: Positive for myalgias. Negative for back pain and gait problem.  Neurological: Negative for dizziness, seizures, syncope and weakness.       Objective:   Physical Exam  Constitutional: She is oriented to person, place, and time. She appears well-developed and well-nourished.  HENT:  Right Ear:  External ear normal.  Left Ear: External ear normal.  Mouth/Throat: Oropharynx is clear and moist.  Neck: Neck supple.  Cardiovascular: Normal rate and regular rhythm.   Pulmonary/Chest: Effort normal and breath sounds normal. No respiratory distress. She has no wheezes. She has no rales.  Musculoskeletal: She exhibits no edema.  Feet reveal no skin lesions. Good distal foot pulses. Good capillary refill. No calluses. Normal sensation with monofilament testing   Lymphadenopathy:    She has no cervical adenopathy.  Neurological: She is alert and oriented to person, place, and time.          Assessment & Plan:  #1 hypertension. Stable. Continue current medications. Check basic metabolic panel #2 type 2 diabetes. Recheck A1c. Continue yearly eye exams #3 myalgias lower extremities. ?etiology.  Given anterior location does not appear to be radiculopathy. Check TSH. Currently not on any statin.  #4 hyperlipidemia. Treated with WelChol. Cannot take statins. Check lipid panel.  #5 GERD. Script DAW for  AcipHex.

## 2012-04-17 NOTE — Progress Notes (Signed)
Quick Note:  I will mail labs to pt home as requested ______

## 2012-05-15 ENCOUNTER — Ambulatory Visit
Admission: RE | Admit: 2012-05-15 | Discharge: 2012-05-15 | Disposition: A | Discharge status: Home / Self Care | Payer: Medicare Other | Source: Ambulatory Visit | Attending: Neurosurgery | Admitting: Neurosurgery

## 2012-05-15 ENCOUNTER — Other Ambulatory Visit: Payer: Self-pay | Admitting: Neurosurgery

## 2012-05-15 DIAGNOSIS — D329 Benign neoplasm of meninges, unspecified: Secondary | ICD-10-CM

## 2012-05-15 MED ORDER — GADOBENATE DIMEGLUMINE 529 MG/ML IV SOLN
19.0000 mL | Freq: Once | INTRAVENOUS | Status: AC | PRN
  Administered 2012-05-15: 19 mL via INTRAVENOUS

## 2012-08-14 ENCOUNTER — Other Ambulatory Visit: Payer: Self-pay | Admitting: Neurological Surgery

## 2012-08-14 DIAGNOSIS — IMO0002 Reserved for concepts with insufficient information to code with codable children: Secondary | ICD-10-CM

## 2012-08-18 ENCOUNTER — Ambulatory Visit
Admission: RE | Admit: 2012-08-18 | Discharge: 2012-08-18 | Disposition: A | Discharge status: Home / Self Care | Payer: Medicare Other | Source: Ambulatory Visit | Attending: Neurological Surgery | Admitting: Neurological Surgery

## 2012-08-18 DIAGNOSIS — IMO0002 Reserved for concepts with insufficient information to code with codable children: Secondary | ICD-10-CM

## 2012-08-18 MED ORDER — GADOBENATE DIMEGLUMINE 529 MG/ML IV SOLN
10.0000 mL | Freq: Once | INTRAVENOUS | Status: AC | PRN
  Administered 2012-08-18: 10 mL via INTRAVENOUS

## 2012-08-21 ENCOUNTER — Other Ambulatory Visit: Payer: Self-pay

## 2012-08-21 MED ORDER — METOPROLOL SUCCINATE ER 100 MG PO TB24: 100.0000 mg | ORAL_TABLET | Freq: Every day | ORAL | Status: DC

## 2012-09-04 ENCOUNTER — Ambulatory Visit (INDEPENDENT_AMBULATORY_CARE_PROVIDER_SITE_OTHER): Payer: Medicare Other | Admitting: Family Medicine

## 2012-09-04 ENCOUNTER — Encounter: Payer: Self-pay | Admitting: Family Medicine

## 2012-09-04 VITALS — BP 140/94 | HR 82 | Temp 98.0°F | Wt 230.0 lb

## 2012-09-04 DIAGNOSIS — E119 Type 2 diabetes mellitus without complications: Secondary | ICD-10-CM

## 2012-09-04 DIAGNOSIS — I10 Essential (primary) hypertension: Secondary | ICD-10-CM

## 2012-09-04 LAB — HM DIABETES FOOT EXAM: HM Diabetic Foot Exam: NORMAL

## 2012-09-04 MED ORDER — AMLODIPINE BESYLATE 5 MG PO TABS: 5.0000 mg | ORAL_TABLET | Freq: Every day | ORAL | Status: DC

## 2012-09-04 NOTE — Progress Notes (Signed)
Subjective:    Patient ID: Dawn Salas, female    DOB: [birth year], 72 y.o.   MRN: [id number]  HPI Patient seen for poorly controlled hypertension. She was at a neurosurgeon office earlier today with blood pressure 181/98. She had home blood pressure yesterday 144 over 80s. She's had some vague bilateral headaches recently. Some increased malaise. Currently takes losartan HCTZ and metoprolol. Poor compliance with dietary sodium at times. No alcohol use. Occasional Aleve use recently. Denies any recent chest pain. Compliant with medications.  Had previous back surgeries had some recent increased back pains and left lumbar radiculopathy pains. Followed by neurosurgeon.  Type 2 diabetes. Last A1c 6.9%. Fasting blood sugar stable. No polydipsia or polyuria.  Past Medical History  Diagnosis Date  . Allergy   . Asthma   . Hyperlipidemia   . Hypertension   . Depression   . GERD (gastroesophageal reflux disease)   . Peptic ulcer disease   . Palpitations   . Multiple meningiomas of spine and brain   . Hyperglycemia   . Obstructive sleep apnea     STUDY AT [facility] DOES NOT USE C PAP  . Diabetes mellitus     TYPE ONE  . Hyperthyroidism     PT HAD BIPOSY -NEGATIVE.Marland Kitchen AND NOW JUST GETS CHECKED EACH YEAR .Marland Kitchen 2013 LAST TEST   SEES DR. [name].   Marland Kitchen Arthritis    Past Surgical History  Procedure Laterality Date  . Abdominal hysterectomy    . Tonsillectomy    . Tubal ligation    . Right knee arthroscopy      x2  . Right foot bunectomy    . Right shoulder rotator cuff    . L4-l5 posterior la    . C5-c6 neck fusion    . Left foot plantar and hammertoe    . Brain meningioma excision  8/10  . Back surgery      L SPIN 2003   NECK 2004  . Dilation and curettage of uterus      YEARS AGO  . Craniotomy  08/13/2011    Procedure: CRANIOTOMY TUMOR EXCISION;  Surgeon: Cristi Loron, MD;  Location: [facility];  Service: Neurosurgery;  Laterality: Bilateral;  Bifrontal craniotomy for tumor    reports that she has never smoked. She does not have any smokeless tobacco history on file. She reports that she does not drink alcohol or use illicit drugs. family history includes Anesthesia problems in her daughter; Arthritis in her other; Cancer in her mother; Diabetes in her other; Heart disease (age of onset: 62) in her father; Hyperlipidemia in her other; Hypertension in her other. Allergies  Allergen Reactions  . Flagyl [Metronidazole] Other (See Comments)    Caused increased heart rate  . Aspirin Effervescent Swelling    Alka-Seltzer gel caps- sore mouth, face swollen, turned hands white  . Morphine Sulfate     REACTION: hives  . Penicillins     REACTION: rash      Review of Systems  Constitutional: Negative for fatigue and unexpected weight change.  Eyes: Negative for visual disturbance.  Respiratory: Negative for cough, chest tightness, shortness of breath and wheezing.   Cardiovascular: Negative for chest pain, palpitations and leg swelling.  Musculoskeletal: Positive for back pain.  Neurological: Positive for headaches. Negative for dizziness, seizures, syncope, weakness and light-headedness.       Objective:   Physical Exam  Constitutional: She appears well-developed and well-nourished.  Neck: Neck supple. No thyromegaly present.  Cardiovascular:  Normal rate and regular rhythm.  Exam reveals no gallop.   Pulmonary/Chest: Effort normal and breath sounds normal. No respiratory distress. She has no wheezes. She has no rales.  Musculoskeletal: She exhibits no edema.  Psychiatric: She has a normal mood and affect. Her behavior is normal.          Assessment & Plan:  #1 hypertension. Poorly controlled. Amlodipine 5 mg daily. Decrease sodium to 2-3 g daily. Avoid regular use of nonsteroidals. Reassess blood pressure 3-4 weeks #2 type 2 diabetes. Check hemoglobin A1c

## 2012-09-04 NOTE — Patient Instructions (Signed)

## 2012-09-05 LAB — HEMOGLOBIN A1C: Hgb A1c MFr Bld: 7 % — ABNORMAL HIGH (ref 4.6–6.5)

## 2012-09-25 ENCOUNTER — Encounter: Payer: Self-pay | Admitting: Family Medicine

## 2012-09-25 ENCOUNTER — Ambulatory Visit (INDEPENDENT_AMBULATORY_CARE_PROVIDER_SITE_OTHER): Payer: Medicare Other | Admitting: Family Medicine

## 2012-09-25 VITALS — BP 124/72 | HR 85 | Temp 97.5°F | Wt 216.0 lb

## 2012-09-25 DIAGNOSIS — I10 Essential (primary) hypertension: Secondary | ICD-10-CM

## 2012-09-25 DIAGNOSIS — R29898 Other symptoms and signs involving the musculoskeletal system: Secondary | ICD-10-CM

## 2012-09-25 DIAGNOSIS — Z862 Personal history of diseases of the blood and blood-forming organs and certain disorders involving the immune mechanism: Secondary | ICD-10-CM

## 2012-09-25 LAB — CBC WITH DIFFERENTIAL/PLATELET
Basophils Absolute: 0 10*3/uL (ref 0.0–0.1)
Basophils Relative: 0.5 % (ref 0.0–3.0)
Eosinophils Absolute: 0 10*3/uL (ref 0.0–0.7)
Eosinophils Relative: 0.5 % (ref 0.0–5.0)
HCT: 39.8 % (ref 36.0–46.0)
Hemoglobin: 13 g/dL (ref 12.0–15.0)
Lymphocytes Relative: 32.3 % (ref 12.0–46.0)
Lymphs Abs: 1.7 10*3/uL (ref 0.7–4.0)
MCHC: 32.7 g/dL (ref 30.0–36.0)
MCV: 80.2 fl (ref 78.0–100.0)
Monocytes Absolute: 0.3 10*3/uL (ref 0.1–1.0)
Monocytes Relative: 5.9 % (ref 3.0–12.0)
Neutro Abs: 3.2 10*3/uL (ref 1.4–7.7)
Neutrophils Relative %: 60.8 % (ref 43.0–77.0)
Platelets: 252 10*3/uL (ref 150.0–400.0)
RBC: 4.96 Mil/uL (ref 3.87–5.11)
RDW: 14.7 % — ABNORMAL HIGH (ref 11.5–14.6)
WBC: 5.3 10*3/uL (ref 4.5–10.5)

## 2012-09-25 NOTE — Progress Notes (Signed)
Subjective:    Patient ID: Dawn Salas, female    DOB: [birth year], 72 y.o.   MRN: [id number]  HPI Patient seen for medical followup Recent poor control hypertension. We added amlodipine 5 mg daily. She has seen great improvement in blood pressures with mostly 120s to 130s systolic. Denies any dizziness or headache. Previous peripheral edema seems to be improving and not worsening. She's lost about 14 pounds since last visit which she cannot attribute to any particular thing. She has fair appetite.  She does complain of some generalized weakness but again no orthostasis. She has had prior history of anemia which was postoperative last summer with hemoglobin 8.3. She requests to have this rechecked. She has some nonspecific weakness of both legs. Still exercises with swimming without much difficulty. She's not describing any claudication-type symptoms. She has chronic low back pain with prior surgeries. She does not describe any typical lumbar stenosis symptoms as her pain actually improves with walking. She is followed closely by neurosurgery  Past Medical History  Diagnosis Date  . Allergy   . Asthma   . Hyperlipidemia   . Hypertension   . Depression   . GERD (gastroesophageal reflux disease)   . Peptic ulcer disease   . Palpitations   . Multiple meningiomas of spine and brain   . Hyperglycemia   . Obstructive sleep apnea     STUDY AT [facility] DOES NOT USE C PAP  . Diabetes mellitus     TYPE ONE  . Hyperthyroidism     PT HAD BIPOSY -NEGATIVE.Marland Kitchen AND NOW JUST GETS CHECKED EACH YEAR .Marland Kitchen 2013 LAST TEST   SEES DR. [name].   Marland Kitchen Arthritis    Past Surgical History  Procedure Laterality Date  . Abdominal hysterectomy    . Tonsillectomy    . Tubal ligation    . Right knee arthroscopy      x2  . Right foot bunectomy    . Right shoulder rotator cuff    . L4-l5 posterior la    . C5-c6 neck fusion    . Left foot plantar and hammertoe    . Brain meningioma excision  8/10  . Back  surgery      L SPIN 2003   NECK 2004  . Dilation and curettage of uterus      YEARS AGO  . Craniotomy  08/13/2011    Procedure: CRANIOTOMY TUMOR EXCISION;  Surgeon: Cristi Loron, MD;  Location: [facility];  Service: Neurosurgery;  Laterality: Bilateral;  Bifrontal craniotomy for tumor    reports that she has never smoked. She does not have any smokeless tobacco history on file. She reports that she does not drink alcohol or use illicit drugs. family history includes Anesthesia problems in her daughter; Arthritis in her other; Cancer in her mother; Diabetes in her other; Heart disease (age of onset: 18) in her father; Hyperlipidemia in her other; Hypertension in her other. Allergies  Allergen Reactions  . Flagyl [Metronidazole] Other (See Comments)    Caused increased heart rate  . Aspirin Effervescent Swelling    Alka-Seltzer gel caps- sore mouth, face swollen, turned hands white  . Morphine Sulfate     REACTION: hives  . Penicillins     REACTION: rash      Review of Systems  Constitutional: Positive for fatigue. Negative for fever, chills, appetite change and unexpected weight change.  Respiratory: Negative for cough and shortness of breath.   Cardiovascular: Negative for chest pain, palpitations and  leg swelling.  Gastrointestinal: Negative for abdominal pain and blood in stool.  Neurological: Negative for dizziness, seizures, syncope and headaches.       Objective:   Physical Exam  Constitutional: She is oriented to person, place, and time. She appears well-developed and well-nourished.  HENT:  Mouth/Throat: Oropharynx is clear and moist.  Neck: Neck supple. No thyromegaly present.  Cardiovascular: Normal rate and regular rhythm.   Pulmonary/Chest: Effort normal and breath sounds normal. No respiratory distress. She has no wheezes. She has no rales.  Musculoskeletal: She exhibits no edema.  Neurological: She is alert and oriented to person, place, and time. No  cranial nerve deficit.  She has full-strength lower extremities by testing. She has diminished right knee reflex compared to left and this is chronic. 2+ ankle reflexes bilaterally          Assessment & Plan:  #1 hypertension. Improved and now at goal. She appears to be tolerating amlodipine without any side effects #2 history of postoperative anemia over one year ago. No followup CBC since then. Recheck CBC today #3 fatigue and nonspecific leg weakness possibly related to #2. She has preserved reflexes with exception of chronic decreased right knee reflex. Nothing to suggest Guillain-Barr with no obvious focal weakness and preserved reflexes with the exception of above Question related to chronic back difficulties.

## 2012-10-29 ENCOUNTER — Other Ambulatory Visit: Payer: Self-pay | Admitting: Family Medicine

## 2012-11-10 ENCOUNTER — Encounter: Payer: Self-pay | Admitting: Family Medicine

## 2012-11-10 ENCOUNTER — Ambulatory Visit (INDEPENDENT_AMBULATORY_CARE_PROVIDER_SITE_OTHER): Payer: Medicare Other | Admitting: Family Medicine

## 2012-11-10 VITALS — BP 132/80 | HR 94 | Temp 97.5°F | Wt 216.0 lb

## 2012-11-10 DIAGNOSIS — Z23 Encounter for immunization: Secondary | ICD-10-CM

## 2012-11-10 DIAGNOSIS — R300 Dysuria: Secondary | ICD-10-CM

## 2012-11-10 DIAGNOSIS — N390 Urinary tract infection, site not specified: Secondary | ICD-10-CM

## 2012-11-10 LAB — POCT URINALYSIS DIPSTICK
Bilirubin, UA: NEGATIVE
Nitrite, UA: POSITIVE
Spec Grav, UA: 1.025
Urobilinogen, UA: 0.2
pH, UA: 5.5

## 2012-11-10 MED ORDER — CIPROFLOXACIN HCL 500 MG PO TABS: 500.0000 mg | ORAL_TABLET | Freq: Two times a day (BID) | ORAL | Status: DC

## 2012-11-10 MED ORDER — GLUCOSE BLOOD VI STRP: 2.0000 | ORAL_STRIP | Status: DC | PRN

## 2012-11-10 NOTE — Addendum Note (Signed)
Addended by: Thomasena Edis on: 11/10/2012 03:50 PM   Modules accepted: Orders

## 2012-11-10 NOTE — Progress Notes (Signed)
  Subjective:    Patient ID: Dawn Salas, female    DOB: December 28, 1940, 72 y.o.   MRN: [id number]  HPI  Acute visit Patient seen with 2 day history of dysuria. She has urine frequency and some burning with urination. She also noticed occasional tinges of blood in her urine Denies any nausea or vomiting. Good fluid intake. No flank pain. She has history of allergy to sulfa. Denies any abdominal pain  Past Medical History  Diagnosis Date  . Allergy   . Asthma   . Hyperlipidemia   . Hypertension   . Depression   . GERD (gastroesophageal reflux disease)   . Peptic ulcer disease   . Palpitations   . Multiple meningiomas of spine and brain   . Hyperglycemia   . Obstructive sleep apnea     STUDY AT [facility] DOES NOT USE C PAP  . Diabetes mellitus     TYPE ONE  . Hyperthyroidism     PT HAD BIPOSY -NEGATIVE.Marland Kitchen AND NOW JUST GETS CHECKED EACH YEAR .Marland Kitchen 2013 LAST TEST   SEES DR. [name].   Marland Kitchen Arthritis    Past Surgical History  Procedure Laterality Date  . Abdominal hysterectomy    . Tonsillectomy    . Tubal ligation    . Right knee arthroscopy      x2  . Right foot bunectomy    . Right shoulder rotator cuff    . L4-l5 posterior la    . C5-c6 neck fusion    . Left foot plantar and hammertoe    . Brain meningioma excision  8/10  . Back surgery      L SPIN 2003   NECK 2004  . Dilation and curettage of uterus      YEARS AGO  . Craniotomy  08/13/2011    Procedure: CRANIOTOMY TUMOR EXCISION;  Surgeon: Cristi Loron, MD;  Location: [facility];  Service: Neurosurgery;  Laterality: Bilateral;  Bifrontal craniotomy for tumor    reports that she has never smoked. She does not have any smokeless tobacco history on file. She reports that she does not drink alcohol or use illicit drugs. family history includes Anesthesia problems in her daughter; Arthritis in her other; Cancer in her mother; Diabetes in her other; Heart disease (age of onset: 12) in her father; Hyperlipidemia in her other;  Hypertension in her other. Allergies  Allergen Reactions  . Flagyl [Metronidazole] Other (See Comments)    Caused increased heart rate  . Aspirin Effervescent Swelling    Alka-Seltzer gel caps- sore mouth, face swollen, turned hands white  . Morphine Sulfate     REACTION: hives  . Penicillins     REACTION: rash     Review of Systems  Constitutional: Negative for fever, chills and appetite change.  Gastrointestinal: Negative for nausea, vomiting, abdominal pain, diarrhea and constipation.  Genitourinary: Positive for dysuria, frequency and hematuria.  Musculoskeletal: Negative for back pain.  Neurological: Negative for dizziness.       Objective:   Physical Exam  Constitutional: She appears well-developed and well-nourished.  HENT:  Head: Normocephalic and atraumatic.  Cardiovascular: Normal rate, regular rhythm and normal heart sounds.   Pulmonary/Chest: Breath sounds normal.  Abdominal: Soft. Bowel sounds are normal. There is no tenderness.          Assessment & Plan:  Uncomplicated cystitis. Urine culture sent. Cipro 500 mg twice a day for 5 days.

## 2012-11-10 NOTE — Patient Instructions (Signed)
Urinary Tract Infection  Urinary tract infections (UTIs) can develop anywhere along your urinary tract. Your urinary tract is your body's drainage system for removing wastes and extra water. Your urinary tract includes two kidneys, two ureters, a bladder, and a urethra. Your kidneys are a pair of bean-shaped organs. Each kidney is about the size of your fist. They are located below your ribs, one on each side of your spine.  CAUSES  Infections are caused by microbes, which are microscopic organisms, including fungi, viruses, and bacteria. These organisms are so small that they can only be seen through a microscope. Bacteria are the microbes that most commonly cause UTIs.  SYMPTOMS   Symptoms of UTIs may vary by age and gender of the patient and by the location of the infection. Symptoms in young women typically include a frequent and intense urge to urinate and a painful, burning feeling in the bladder or urethra during urination. Older women and men are more likely to be tired, shaky, and weak and have muscle aches and abdominal pain. A fever may mean the infection is in your kidneys. Other symptoms of a kidney infection include pain in your back or sides below the ribs, nausea, and vomiting.  DIAGNOSIS  To diagnose a UTI, your caregiver will ask you about your symptoms. Your caregiver also will ask to provide a urine sample. The urine sample will be tested for bacteria and white blood cells. White blood cells are made by your body to help fight infection.  TREATMENT   Typically, UTIs can be treated with medication. Because most UTIs are caused by a bacterial infection, they usually can be treated with the use of antibiotics. The choice of antibiotic and length of treatment depend on your symptoms and the type of bacteria causing your infection.  HOME CARE INSTRUCTIONS   If you were prescribed antibiotics, take them exactly as your caregiver instructs you. Finish the medication even if you feel better after you  have only taken some of the medication.   Drink enough water and fluids to keep your urine clear or pale yellow.   Avoid caffeine, tea, and carbonated beverages. They tend to irritate your bladder.   Empty your bladder often. Avoid holding urine for long periods of time.   Empty your bladder before and after sexual intercourse.   After a bowel movement, women should cleanse from front to back. Use each tissue only once.  SEEK MEDICAL CARE IF:    You have back pain.   You develop a fever.   Your symptoms do not begin to resolve within 3 days.  SEEK IMMEDIATE MEDICAL CARE IF:    You have severe back pain or lower abdominal pain.   You develop chills.   You have nausea or vomiting.   You have continued burning or discomfort with urination.  MAKE SURE YOU:    Understand these instructions.   Will watch your condition.   Will get help right away if you are not doing well or get worse.  Document Released: 10/18/2004 Document Revised: 07/10/2011 Document Reviewed: 02/16/2011  [employer] Patient Information 2014 [employer]

## 2012-11-12 LAB — URINE CULTURE: Colony Count: >=100000

## 2012-11-24 LAB — HM DIABETES EYE EXAM

## 2012-11-25 ENCOUNTER — Encounter: Payer: Self-pay | Admitting: Family Medicine

## 2012-12-30 ENCOUNTER — Encounter: Payer: Self-pay | Admitting: Family Medicine

## 2012-12-30 ENCOUNTER — Ambulatory Visit (INDEPENDENT_AMBULATORY_CARE_PROVIDER_SITE_OTHER): Payer: Medicare Other | Admitting: Family Medicine

## 2012-12-30 VITALS — BP 130/68 | HR 79 | Temp 97.8°F | Wt 214.0 lb

## 2012-12-30 DIAGNOSIS — E669 Obesity, unspecified: Secondary | ICD-10-CM | POA: Insufficient documentation

## 2012-12-30 DIAGNOSIS — E119 Type 2 diabetes mellitus without complications: Secondary | ICD-10-CM

## 2012-12-30 DIAGNOSIS — I10 Essential (primary) hypertension: Secondary | ICD-10-CM

## 2012-12-30 DIAGNOSIS — M791 Myalgia, unspecified site: Secondary | ICD-10-CM

## 2012-12-30 DIAGNOSIS — IMO0001 Reserved for inherently not codable concepts without codable children: Secondary | ICD-10-CM

## 2012-12-30 LAB — HEMOGLOBIN A1C: Hgb A1c MFr Bld: 6.9 % — ABNORMAL HIGH (ref 4.6–6.5)

## 2012-12-30 NOTE — Progress Notes (Signed)
Subjective:    Patient ID: Dawn Salas, female    DOB: Mar 18, 1940, 72 y.o.   MRN: [id number]  HPI  Medical followup. She has history of obesity, partial seizure disorder, benign meningiomas, hypertension, GERD, type 2 diabetes.  Diabetes been well controlled. Last A1c 7.0%. Takes metformin. No recent side effects. No recent hyperglycemia symptoms.  She complains of generalized fatigue and myalgias. Has never had vitamin D level to her knowledge. Recent TSH normal. No significant arthralgias. Her myalgias are diffuse and include lower extremities. She is currently not exercising. No statin therapy.  Hypertension treated with losartan HCTZ and amlodipine as well as metoprolol. Blood pressures been stable. No dizziness. No chest pains.  Has history of goiter which is followed by endocrinologist.  Past Medical History  Diagnosis Date  . Allergy   . Asthma   . Hyperlipidemia   . Hypertension   . Depression   . GERD (gastroesophageal reflux disease)   . Peptic ulcer disease   . Palpitations   . Multiple meningiomas of spine and brain   . Hyperglycemia   . Obstructive sleep apnea     STUDY AT [facility] DOES NOT USE C PAP  . Diabetes mellitus     TYPE ONE  . Hyperthyroidism     PT HAD BIPOSY -NEGATIVE.Marland Kitchen AND NOW JUST GETS CHECKED EACH YEAR .Marland Kitchen 2013 LAST TEST   SEES DR. [name].   Marland Kitchen Arthritis    Past Surgical History  Procedure Laterality Date  . Abdominal hysterectomy    . Tonsillectomy    . Tubal ligation    . Right knee arthroscopy      x2  . Right foot bunectomy    . Right shoulder rotator cuff    . L4-l5 posterior la    . C5-c6 neck fusion    . Left foot plantar and hammertoe    . Brain meningioma excision  8/10  . Back surgery      L SPIN 2003   NECK 2004  . Dilation and curettage of uterus      YEARS AGO  . Craniotomy  08/13/2011    Procedure: CRANIOTOMY TUMOR EXCISION;  Surgeon: Cristi Loron, MD;  Location: [facility];  Service: Neurosurgery;  Laterality:  Bilateral;  Bifrontal craniotomy for tumor    reports that she has never smoked. She does not have any smokeless tobacco history on file. She reports that she does not drink alcohol or use illicit drugs. family history includes Anesthesia problems in her daughter; Arthritis in her other; Cancer in her mother; Diabetes in her other; Heart disease (age of onset: 29) in her father; Hyperlipidemia in her other; Hypertension in her other. Allergies  Allergen Reactions  . Flagyl [Metronidazole] Other (See Comments)    Caused increased heart rate  . Aspirin Effervescent Swelling    Alka-Seltzer gel caps- sore mouth, face swollen, turned hands white  . Morphine Sulfate     REACTION: hives  . Penicillins     REACTION: rash     Review of Systems  Constitutional: Positive for fatigue. Negative for unexpected weight change.  Eyes: Negative for visual disturbance.  Respiratory: Negative for cough, chest tightness, shortness of breath and wheezing.   Cardiovascular: Negative for chest pain, palpitations and leg swelling.  Endocrine: Negative for polydipsia and polyuria.  Musculoskeletal: Positive for myalgias.  Neurological: Negative for dizziness, seizures, syncope, weakness, light-headedness and headaches.       Objective:   Physical Exam  Constitutional: She  is oriented to person, place, and time. She appears well-developed and well-nourished.  Neck: Neck supple.  Patient has multiple palpated goiter masses anterior neck which have been previously noted  Cardiovascular: Normal rate.   Pulmonary/Chest: Effort normal and breath sounds normal.  Musculoskeletal: She exhibits no edema.  Neurological: She is alert and oriented to person, place, and time.          Assessment & Plan:  #1 hypertension. Adequate control. Continue current medications #2 type 2 diabetes. Repeat A1c. History of good control. Continue close monitoring. Weight control discussed. Recent eye exam no retinopathy  changes. #3 myalgias and fatigue. Recent TSH normal. Check 25-hydroxy vitamin D. Patient is at high risk given her skin color along with low dietary intake

## 2012-12-30 NOTE — Progress Notes (Signed)
Pre visit review using our clinic review tool, if applicable. No additional management support is needed unless otherwise documented below in the visit note. 

## 2012-12-31 LAB — VITAMIN D 25 HYDROXY (VIT D DEFICIENCY, FRACTURES): Vit D, 25-Hydroxy: 46 ng/mL (ref 30–89)

## 2013-01-13 ENCOUNTER — Telehealth: Payer: Self-pay | Admitting: Family Medicine

## 2013-01-13 MED ORDER — AMLODIPINE BESYLATE 5 MG PO TABS: 5.0000 mg | ORAL_TABLET | Freq: Every day | ORAL | Status: DC

## 2013-01-13 NOTE — Telephone Encounter (Signed)
[facility] is requesting refill of amLODipine (NORVASC) 5 MG tablet (last filled 11/28/12 #30)

## 2013-01-13 NOTE — Telephone Encounter (Signed)
RX sent to pharmacy  

## 2013-01-22 DIAGNOSIS — R569 Unspecified convulsions: Secondary | ICD-10-CM

## 2013-01-22 HISTORY — DX: Unspecified convulsions: R56.9

## 2013-01-29 ENCOUNTER — Other Ambulatory Visit: Payer: Self-pay | Admitting: Family Medicine

## 2013-01-29 DIAGNOSIS — Z1231 Encounter for screening mammogram for malignant neoplasm of breast: Secondary | ICD-10-CM

## 2013-02-05 ENCOUNTER — Telehealth: Payer: Self-pay | Admitting: Family Medicine

## 2013-02-05 ENCOUNTER — Ambulatory Visit (HOSPITAL_COMMUNITY)
Admission: RE | Admit: 2013-02-05 | Discharge: 2013-02-05 | Disposition: A | Discharge status: Home / Self Care | Payer: Medicare Other | Source: Ambulatory Visit | Attending: Family Medicine | Admitting: Family Medicine

## 2013-02-05 DIAGNOSIS — Z1231 Encounter for screening mammogram for malignant neoplasm of breast: Secondary | ICD-10-CM

## 2013-02-05 MED ORDER — ACIPHEX 20 MG PO TBEC: DELAYED_RELEASE_TABLET | ORAL | Status: DC

## 2013-02-05 NOTE — Telephone Encounter (Signed)
RX sent to pharmacy  

## 2013-02-05 NOTE — Telephone Encounter (Signed)
[facility] requesting refill of rabeprazole sodium 20 mg ter #90, Sig: Take one(1) tablet each day.

## 2013-02-17 ENCOUNTER — Encounter: Payer: Self-pay | Admitting: Physician Assistant

## 2013-02-17 ENCOUNTER — Ambulatory Visit (INDEPENDENT_AMBULATORY_CARE_PROVIDER_SITE_OTHER): Payer: Medicare Other | Admitting: Physician Assistant

## 2013-02-17 VITALS — BP 112/70 | HR 112 | Ht 67.0 in | Wt 206.0 lb

## 2013-02-17 DIAGNOSIS — R634 Abnormal weight loss: Secondary | ICD-10-CM

## 2013-02-17 DIAGNOSIS — K5732 Diverticulitis of large intestine without perforation or abscess without bleeding: Secondary | ICD-10-CM

## 2013-02-17 DIAGNOSIS — R1032 Left lower quadrant pain: Secondary | ICD-10-CM

## 2013-02-17 MED ORDER — CIPROFLOXACIN HCL 500 MG PO TABS: 500.0000 mg | ORAL_TABLET | Freq: Two times a day (BID) | ORAL | Status: AC

## 2013-02-17 NOTE — Patient Instructions (Signed)
We sent a prescription to [facility] in [place] for [place]. Take the medication with food. Continue the Aciphex 20 mg.  Try eating 5 small meals daily.

## 2013-02-17 NOTE — Progress Notes (Signed)
Reviewed and agree with management.  If no improvement in the next 3-4 days would proceed with CT of the abdomen and pelvis Sandy Salaam. Deatra Ina, M.D., [facility]

## 2013-02-17 NOTE — Progress Notes (Signed)
Subjective:    Patient ID: Dawn Salas, female    DOB: 10-14-1940, 73 y.o.   MRN: [id number]  HPI  Dawn Salas is a pleasant 73 year old African American female known to Dr. Deatra Ina. She had colonoscopy in April of 2011 done for routine screening and was found to have moderate diverticulosis of the sigmoid colon. Other medical problems include palpitations, hypertension, diabetes mellitus, partial seizure disorder, sleep apnea. She status post right frontal meningioma removal in 2013. She also has chronic GERD. She has been treated previously for diverticulitis but no episodes over the past few years. She comes in today stating that she's been having some lower abdominal discomfort over the past month or so but seems to come and go. She says sometimes it seems to bother her with walking and feels sore "like trapped gas "". She has had some decrease in her appetite over the past couple of months and has lost 7 or 8 pounds. She has had some vague queasiness but no vomiting. Her bowel habits tend to alternate between constipation and days of having frequent bowel movements. 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    Review of Systems  Constitutional: Positive for appetite change and unexpected weight change.  HENT: Negative.   Respiratory: Negative.   Cardiovascular: Positive for palpitations.  Gastrointestinal: Positive for nausea and abdominal pain.  Endocrine: Negative.   Genitourinary: Negative.   Musculoskeletal: Negative.   Allergic/Immunologic: Negative.   Neurological: Negative.   Hematological: Negative.   Psychiatric/Behavioral: Negative.    Outpatient Prescriptions Prior to Visit  Medication Sig Dispense Refill  . ACIPHEX 20 MG tablet One tablet daily.  DAW-no generics  90 tablet  3  . albuterol (PROVENTIL,VENTOLIN) 90 MCG/ACT inhaler Inhale 2 puffs  into the lungs every 6 (six) hours as needed. For shortness of breath      . amLODipine (NORVASC) 5 MG tablet Take 1 tablet (5 mg total) by mouth daily.  30 tablet  11  . cetirizine (ZYRTEC) 10 MG tablet Take 10 mg by mouth daily as needed. For allergies      . Cyanocobalamin (VITAMIN B12 PO) Take 10 mLs by mouth daily. OTC      . diazepam (VALIUM) 5 MG tablet Take 2.5 mg by mouth daily as needed. For anxiety      . diclofenac sodium (VOLTAREN) 1 % GEL Apply 1 application topically 2 (two) times daily.      . fluticasone (FLONASE) 50 MCG/ACT nasal spray Place 2 sprays into the nose daily as needed. For allergies, stuffy head      . furosemide (LASIX) 40 MG tablet Take 20 mg by mouth daily as needed. For fluid      . glucose blood (EASYMAX TEST) test strip 2 each by Other route as needed for other.  100 each  3  . glucose blood test strip Use daily as instructed  100 each  12  . hydrocortisone (ANUSOL-HC) 2.5 % rectal cream Place rectally 2 (two) times daily.  30 g  0  . Iron-Vitamins (GERITOL COMPLETE PO) Take 1 tablet by mouth daily.      Marland Kitchen losartan-hydrochlorothiazide (HYZAAR) 100-12.5 MG per tablet TAKE 1 TABLET DAILY  90 tablet  3  . metoprolol succinate (TOPROL XL) 100 MG 24 hr tablet Take 1 tablet (100 mg total) by mouth daily.  90 tablet  3  . potassium chloride (K-DUR) 10 MEQ tablet Take 10 mEq by mouth daily. May take when pt takes Lasix prn      . valACYclovir (VALTREX) 1000 MG tablet Take 1,000 mg by mouth 2 (two) times daily as needed. For mouth blisters, and nose blisters      . VESICARE 5 MG tablet TAKE 1 TABLET DAILY  90 tablet  3  . ciprofloxacin (CIPRO) 500 MG tablet Take 1 tablet (500 mg total) by mouth 2 (two) times daily.  10 tablet  0  . metFORMIN (GLUCOPHAGE) 500 MG tablet Take 1 tablet (500 mg total) by mouth 2 (two) times daily with a meal.  180 tablet  3   No facility-administered medications prior to visit.   Allergies  Allergen Reactions  . Flagyl [Metronidazole]  Other (See Comments)    Caused increased heart rate  . Aspirin Effervescent Swelling    Alka-Seltzer gel caps- sore mouth, face swollen, turned hands white  . Morphine Sulfate     REACTION: hives  . Penicillins     REACTION: rash   Patient Active Problem List   Diagnosis Date Noted  . Obesity (BMI 30-39.9) 12/30/2012  . Partial seizure disorder 08/21/2011  . Meningioma 08/13/2011  . DIZZINESS 11/02/2009  . DIABETES MELLITUS, TYPE II 05/20/2009  . HYPOKALEMIA 05/02/2009  . DEPRESSION 06/24/2008  . GERD 06/24/2008  . PEPTIC ULCER DISEASE 06/24/2008  . TMJ SYNDROME 06/09/2008  . HYPERLIPIDEMIA 11/08/2006  . OBSTRUCTIVE SLEEP APNEA 11/08/2006  . HYPERTENSION 11/08/2006  . ALLERGIC RHINITIS 11/08/2006  . VOCAL CORD DISORDER 11/08/2006  . ASTHMA 11/08/2006  . TACHYARRHYTHMIA 11/08/2006   History  Substance Use Topics  . Smoking status: Never Smoker   . Smokeless tobacco: Never Used  . Alcohol Use: No      family history includes Anesthesia problems in her daughter; Arthritis in her other; Cancer in her mother; Diabetes in her other; Heart disease (age of onset: 24) in her father; Hyperlipidemia in her other; Hypertension in her other.  Objective:   Physical Exam well-developed older Afro-American female in no acute distress, pleasant blood pressure 112/70 pulse 112 height 5 foot 7 weight 206. HEENT; nontraumatic normocephalic EOMI PERRLA sclera anicteric, Supple no JVD, Cardiovascular; regular rate and rhythm with S1-S2 no murmur or gallop, Pulmonary; clear bilaterally, Abdomen; soft, bowel sounds are present she does have a lower midline incisional scar no guarding or rebound she is tender in the left lower quadrant no palpable hepatosplenomegaly, Rectal; exam not done, Extremities; no clubbing cyanosis or edema skin warm and dry, Psych; mood and affect appropriate        Assessment & Plan:  #81  73 year old female with a several week history of lower abdominal pain -left  greater than right with vague nausea and fatigue .She is definitely tender in the left lower quadrant and suspect she has low-grade diverticulitis.  #2 diabetes mellitus #3 history of palpitations/question SVT #4 partial seizure disorder status post surgery for meningioma 2013  Plan; start Cipro 500 mg by mouth twice daily x14 days Continue AcipHex 20 mg by mouth every morning 5 small frequent meals/snacks per day Patient is asked to followup in about 3 weeks. If she has had any further weight loss and/or abdominal symptoms not improved will proceed with further workup

## 2013-02-26 ENCOUNTER — Encounter: Payer: Self-pay | Admitting: Family Medicine

## 2013-02-26 ENCOUNTER — Ambulatory Visit (INDEPENDENT_AMBULATORY_CARE_PROVIDER_SITE_OTHER): Payer: Medicare Other | Admitting: Family Medicine

## 2013-02-26 VITALS — BP 136/78 | HR 100 | Temp 97.5°F | Wt 205.0 lb

## 2013-02-26 DIAGNOSIS — B9789 Other viral agents as the cause of diseases classified elsewhere: Principal | ICD-10-CM

## 2013-02-26 DIAGNOSIS — J069 Acute upper respiratory infection, unspecified: Secondary | ICD-10-CM

## 2013-02-26 NOTE — Progress Notes (Signed)
Pre visit review using our clinic review tool, if applicable. No additional management support is needed unless otherwise documented below in the visit note. 

## 2013-02-26 NOTE — Patient Instructions (Signed)

## 2013-02-26 NOTE — Progress Notes (Signed)
Subjective:    Patient ID: Dawn Salas, female    DOB: 09-18-1940, 73 y.o.   MRN: [id number]  Cough Associated symptoms include chills and a sore throat. Pertinent negatives include no chest pain, fever, shortness of breath or wheezing.   Patient here for acute visit She had onset last week of cough, fatigue, nasal congestion. She's had chills also noted but not for the past couple days. No confirm fever. Denies any skin rashes. She had some sore throat. Mild intermittent headaches. Symptoms are somewhat relieved with over-the-counter medications. No dyspnea. No nausea or vomiting.  Past Medical History  Diagnosis Date  . Allergy   . Asthma   . Hyperlipidemia   . Hypertension   . Depression   . GERD (gastroesophageal reflux disease)   . Peptic ulcer disease   . Palpitations   . Multiple meningiomas of spine and brain   . Hyperglycemia   . Obstructive sleep apnea     STUDY AT [facility] DOES NOT USE C PAP  . Diabetes mellitus     TYPE ONE  . Hyperthyroidism     PT HAD BIPOSY -NEGATIVE.Marland Kitchen AND NOW JUST GETS CHECKED EACH YEAR .Marland Kitchen 2013 LAST TEST   SEES DR. [name].   Marland Kitchen Arthritis    Past Surgical History  Procedure Laterality Date  . Abdominal hysterectomy    . Tonsillectomy    . Tubal ligation    . Right knee arthroscopy      x2  . Right foot bunectomy    . Right shoulder rotator cuff    . L4-l5 posterior la    . C5-c6 neck fusion    . Left foot plantar and hammertoe    . Brain meningioma excision  8/10  . Back surgery      L SPIN 2003   NECK 2004  . Dilation and curettage of uterus      YEARS AGO  . Craniotomy  08/13/2011    Procedure: CRANIOTOMY TUMOR EXCISION;  Surgeon: [name], MD;  Location: [facility];  Service: Neurosurgery;  Laterality: Bilateral;  Bifrontal craniotomy for tumor    reports that she has never smoked. She has never used smokeless tobacco. She reports that she does not drink alcohol or use illicit drugs. family history includes Anesthesia  problems in her daughter; Arthritis in her other; Cancer in her mother; Diabetes in her other; Heart disease (age of onset: 58) in her father; Hyperlipidemia in her other; Hypertension in her other. Allergies  Allergen Reactions  . Flagyl [Metronidazole] Other (See Comments)    Caused increased heart rate  . Aspirin Effervescent Swelling    Alka-Seltzer gel caps- sore mouth, face swollen, turned hands white  . Morphine Sulfate     REACTION: hives  . Penicillins     REACTION: rash      Review of Systems  Constitutional: Positive for chills and fatigue. Negative for fever.  HENT: Positive for congestion and sore throat.   Respiratory: Positive for cough. Negative for shortness of breath and wheezing.   Cardiovascular: Negative for chest pain.       Objective:   Physical Exam  Constitutional: She appears well-developed and well-nourished.  HENT:  Right Ear: External ear normal.  Left Ear: External ear normal.  Mouth/Throat: Oropharynx is clear and moist.  Neck: Neck supple.  Cardiovascular: Normal rate.   Pulmonary/Chest: Effort normal and breath sounds normal. No respiratory distress. She has no wheezes. She has no rales.  Lymphadenopathy:  She has no cervical adenopathy.          Assessment & Plan:  Viral URI. Reassurance. No indication for antibiotics at this time. Continue over-the-counter medications as needed. Followup promptly for recurrent fever or worsening symptoms

## 2013-03-02 ENCOUNTER — Telehealth: Payer: Self-pay | Admitting: Family Medicine

## 2013-03-02 MED ORDER — FLUTICASONE PROPIONATE 50 MCG/ACT NA SUSP: 2.0000 | Freq: Every day | NASAL | Status: DC | PRN

## 2013-03-02 NOTE — Telephone Encounter (Signed)
RX sent to pharmacy  

## 2013-03-02 NOTE — Telephone Encounter (Signed)
[facility] refill of fluticasone (FLONASE) 50 MCG/ACT nasal spray

## 2013-03-07 ENCOUNTER — Ambulatory Visit (INDEPENDENT_AMBULATORY_CARE_PROVIDER_SITE_OTHER): Payer: Medicare Other | Admitting: Internal Medicine

## 2013-03-07 ENCOUNTER — Encounter: Payer: Self-pay | Admitting: Internal Medicine

## 2013-03-07 ENCOUNTER — Ambulatory Visit: Payer: Medicare Other | Admitting: Internal Medicine

## 2013-03-07 VITALS — BP 150/80 | HR 92 | Temp 97.2°F | Resp 20 | Ht 67.0 in | Wt 203.0 lb

## 2013-03-07 DIAGNOSIS — E119 Type 2 diabetes mellitus without complications: Secondary | ICD-10-CM

## 2013-03-07 DIAGNOSIS — R300 Dysuria: Secondary | ICD-10-CM

## 2013-03-07 DIAGNOSIS — J069 Acute upper respiratory infection, unspecified: Secondary | ICD-10-CM

## 2013-03-07 DIAGNOSIS — N390 Urinary tract infection, site not specified: Secondary | ICD-10-CM

## 2013-03-07 LAB — POCT URINALYSIS DIPSTICK
Bilirubin, UA: NEGATIVE
Glucose, UA: NEGATIVE
Ketones, UA: NEGATIVE
Nitrite, UA: POSITIVE
Protein, UA: 30
Spec Grav, UA: 1.01
Urobilinogen, UA: 0.2
pH, UA: 8.5

## 2013-03-07 MED ORDER — CIPROFLOXACIN HCL 500 MG PO TABS: 500.0000 mg | ORAL_TABLET | Freq: Two times a day (BID) | ORAL | Status: DC

## 2013-03-07 NOTE — Patient Instructions (Signed)
Chest is clear . Antibiotic  For uti will contact you about culture results . Should feel a lot better in 1-2 days .

## 2013-03-07 NOTE — Progress Notes (Signed)
Pre-visit discussion using our clinic review tool. No additional management support is needed unless otherwise documented below in the visit note.   Chief Complaint  Patient presents with  . Dysuria    HPI: Patient comes in today for [facility] Saturday clinic for  new problem evaluation. recent check for flu   Samuel [place] last week.  dayquil in day and coricidin and nyquil .  Had mild sx last week  But now very painful to urinate . Awoke her up with bladder and urgency .   Last rx a month ago .  ? Feverish no chills flank pain hematuria  ? If allergic to sulfa  .ana with pcn  Can take cipro   Last uri rx kleb s to quinolones that she tolerates  ROS: See pertinent positives and negatives per HPI. No sob sill coughing and congested   Past Medical History  Diagnosis Date  . Allergy   . Asthma   . Hyperlipidemia   . Hypertension   . Depression   . GERD (gastroesophageal reflux disease)   . Peptic ulcer disease   . Palpitations   . Multiple meningiomas of spine and brain   . Hyperglycemia   . Obstructive sleep apnea     STUDY AT [facility] DOES NOT USE C PAP  . Diabetes mellitus     TYPE ONE  . Hyperthyroidism     PT HAD BIPOSY -NEGATIVE.Marland Kitchen AND NOW JUST GETS CHECKED EACH YEAR .Marland Kitchen 2013 LAST TEST   SEES DR. [name].   Marland Kitchen Arthritis     Family History  Problem Relation Age of Onset  . Cancer Mother     ovarian  . Heart disease Father 78  . Arthritis Other   . Hyperlipidemia Other   . Hypertension Other   . Diabetes Other   . Anesthesia problems Daughter     NAUSEA AND VOMITING POST OP    History   Social History  . Marital Status: Married    Spouse Name: N/A    Number of Children: N/A  . Years of Education: N/A   Social History Main Topics  . Smoking status: Never Smoker   . Smokeless tobacco: Never Used  . Alcohol Use: No  . Drug Use: No  . Sexual Activity: No   Other Topics Concern  . None   Social History Narrative  . None    Outpatient Encounter Prescriptions as of  03/07/2013  Medication Sig  . ACIPHEX 20 MG tablet One tablet daily.  DAW-no generics  . albuterol (PROVENTIL,VENTOLIN) 90 MCG/ACT inhaler Inhale 2 puffs into the lungs every 6 (six) hours as needed. For shortness of breath  . amLODipine (NORVASC) 5 MG tablet Take 1 tablet (5 mg total) by mouth daily.  . cetirizine (ZYRTEC) 10 MG tablet Take 10 mg by mouth daily as needed. For allergies  . Cyanocobalamin (VITAMIN B12 PO) Take 10 mLs by mouth daily. OTC  . diazepam (VALIUM) 5 MG tablet Take 2.5 mg by mouth daily as needed. For anxiety  . diclofenac sodium (VOLTAREN) 1 % GEL Apply 1 application topically 2 (two) times daily.  . fluticasone (FLONASE) 50 MCG/ACT nasal spray Place 2 sprays into both nostrils daily as needed. For allergies, stuffy head  . furosemide (LASIX) 40 MG tablet Take 20 mg by mouth daily as needed. For fluid  . glucose blood (EASYMAX TEST) test strip 2 each by Other route as needed for other.  . glucose blood test strip Use daily as instructed  .  hydrocortisone (ANUSOL-HC) 2.5 % rectal cream Place rectally 2 (two) times daily.  . Iron-Vitamins (GERITOL COMPLETE PO) Take 1 tablet by mouth daily.  Marland Kitchen losartan-hydrochlorothiazide (HYZAAR) 100-12.5 MG per tablet TAKE 1 TABLET DAILY  . metoprolol succinate (TOPROL XL) 100 MG 24 hr tablet Take 1 tablet (100 mg total) by mouth daily.  . potassium chloride (K-DUR) 10 MEQ tablet Take 10 mEq by mouth daily. May take when pt takes Lasix prn  . valACYclovir (VALTREX) 1000 MG tablet Take 1,000 mg by mouth 2 (two) times daily as needed. For mouth blisters, and nose blisters  . VESICARE 5 MG tablet TAKE 1 TABLET DAILY  . ciprofloxacin (CIPRO) 500 MG tablet Take 1 tablet (500 mg total) by mouth 2 (two) times daily.  . metFORMIN (GLUCOPHAGE) 500 MG tablet Take 1 tablet (500 mg total) by mouth 2 (two) times daily with a meal.    EXAM:  BP 150/80  Pulse 92  Temp(Src) 97.2 F (36.2 C) (Oral)  Resp 20  Ht 5\' 7"  (1.702 m)  Wt 203 lb (92.08  kg)  BMI 31.79 kg/m2  Body mass index is 31.79 kg/(m^2).  GENERAL: vitals reviewed and listed above, alert, oriented, appears well hydrated and in no acute distress congested  Alert non toxic  HEENT: atraumatic, conjunctiva  clear, no obvious abnormalities on inspection of external nose and ears OP : no lesion edema or exudate  Congested  NECK: no obvious masses on inspection palpation  LUNGS: clear to auscultation bilaterally, no wheezes, rales or rhonchi, good air movement CV: HRRR, no clubbing cyanosis or  peripheral edema nl cap refill  Abdomen:  Sof,t normal bowel sounds without hepatosplenomegaly, no guarding rebound or masses no CVA tenderness  Sore suprapubic  MS: moves all extremities without noticeable focal  abnormality PSYCH: pleasant and cooperative, no obvious depression or anxiety  ASSESSMENT AND PLAN:  Discussed the following assessment and plan:  UTI (urinary tract infection) - hx of same  all to a  pcn and ? sulfa  Dysuria - Plan: POCT urinalysis dipstick  Viral URI  DIABETES MELLITUS, TYPE II    Expectant management.  Fu if fever uti sx etc   Ur cx and  -Patient advised to return or notify health care team  if symptoms worsen or persist or new concerns arise.  Patient Instructions  Chest is clear . Antibiotic  For uti will contact you about culture results . Should feel a lot better in 1-2 days .    Standley Brooking. Panosh M.D.

## 2013-03-10 ENCOUNTER — Ambulatory Visit: Payer: Medicare Other | Admitting: Physician Assistant

## 2013-03-10 LAB — URINE CULTURE: Colony Count: >=100000

## 2013-03-11 MED ORDER — CEPHALEXIN 500 MG PO CAPS: 500.0000 mg | ORAL_CAPSULE | Freq: Three times a day (TID) | ORAL | Status: DC

## 2013-03-11 NOTE — Addendum Note (Signed)
Addended by: Colleen Can on: 03/11/2013 12:55 PM   Modules accepted: Orders

## 2013-03-12 ENCOUNTER — Ambulatory Visit (INDEPENDENT_AMBULATORY_CARE_PROVIDER_SITE_OTHER): Payer: Medicare Other | Admitting: Physician Assistant

## 2013-03-12 ENCOUNTER — Encounter: Payer: Self-pay | Admitting: Physician Assistant

## 2013-03-12 VITALS — BP 130/74 | HR 84 | Ht 67.0 in | Wt 204.6 lb

## 2013-03-12 DIAGNOSIS — K589 Irritable bowel syndrome without diarrhea: Secondary | ICD-10-CM

## 2013-03-12 DIAGNOSIS — K5730 Diverticulosis of large intestine without perforation or abscess without bleeding: Secondary | ICD-10-CM

## 2013-03-12 NOTE — Patient Instructions (Signed)
Follow up in 1 month if you continue to have weight loss or decrease in appetite.

## 2013-03-12 NOTE — Progress Notes (Signed)
Subjective:    Patient ID: Dawn Salas, female    DOB: 04-25-1940, 73 y.o.   MRN: [id number]  HPI  Dawn Salas is her nice 73 -year-old female known to Dr. Deatra Ina who was seen in the office on 02/17/2013 with complaints of several week history of lower abdominal  discomfort. She also noted a decrease in her appetite and weight loss of 7 or 8 pounds. He says she had had intermittent vague queasiness but no vomiting. Her bowel habits were alternating between constipation and base of looser stools. Exam was consistent with a mild diverticulitis and she was started on a course of Cipro 500 twice daily for 14 days. She was continued on AcipHex for her reflux symptoms. She comes back in today for followup stating that she is feeling much better and that her lower abdominal  discomfort has resolved. She says she still gets occasional episode of postprandial urgency and loose stools. She also has developed a urinary tract infection and is now taking a course of Keflex. She says her appetite is about the same, she just can't eat as much she used to. Her weight has overall stable.    Review of Systems  Constitutional: Positive for appetite change.  HENT: Negative.   Eyes: Negative.   Respiratory: Negative.   Cardiovascular: Negative.   Gastrointestinal: Negative.   Endocrine: Negative.   Genitourinary: Positive for dysuria.  Musculoskeletal: Negative.   Skin: Negative.   Allergic/Immunologic: Negative.   Neurological: Negative.   Hematological: Negative.   Psychiatric/Behavioral: Negative.    Outpatient Prescriptions Prior to Visit  Medication Sig Dispense Refill  . ACIPHEX 20 MG tablet One tablet daily.  DAW-no generics  90 tablet  3  . albuterol (PROVENTIL,VENTOLIN) 90 MCG/ACT inhaler Inhale 2 puffs into the lungs every 6 (six) hours as needed. For shortness of breath      . amLODipine (NORVASC) 5 MG tablet Take 1 tablet (5 mg total) by mouth daily.  30 tablet  11  . cephALEXin (KEFLEX) 500 MG  capsule Take 1 capsule (500 mg total) by mouth 3 (three) times daily.  21 capsule  0  . cetirizine (ZYRTEC) 10 MG tablet Take 10 mg by mouth daily as needed. For allergies      . Cyanocobalamin (VITAMIN B12 PO) Take 10 mLs by mouth daily. OTC      . diazepam (VALIUM) 5 MG tablet Take 2.5 mg by mouth daily as needed. For anxiety      . diclofenac sodium (VOLTAREN) 1 % GEL Apply 1 application topically 2 (two) times daily.      . fluticasone (FLONASE) 50 MCG/ACT nasal spray Place 2 sprays into both nostrils daily as needed. For allergies, stuffy head  16 g  5  . furosemide (LASIX) 40 MG tablet Take 20 mg by mouth daily as needed. For fluid      . glucose blood (EASYMAX TEST) test strip 2 each by Other route as needed for other.  100 each  3  . glucose blood test strip Use daily as instructed  100 each  12  . hydrocortisone (ANUSOL-HC) 2.5 % rectal cream Place rectally 2 (two) times daily.  30 g  0  . Iron-Vitamins (GERITOL COMPLETE PO) Take 1 tablet by mouth daily.      Marland Kitchen losartan-hydrochlorothiazide (HYZAAR) 100-12.5 MG per tablet TAKE 1 TABLET DAILY  90 tablet  3  . metoprolol succinate (TOPROL XL) 100 MG 24 hr tablet Take 1 tablet (100 mg total) by mouth  daily.  90 tablet  3  . potassium chloride (K-DUR) 10 MEQ tablet Take 10 mEq by mouth daily. May take when pt takes Lasix prn      . valACYclovir (VALTREX) 1000 MG tablet Take 1,000 mg by mouth 2 (two) times daily as needed. For mouth blisters, and nose blisters      . VESICARE 5 MG tablet TAKE 1 TABLET DAILY  90 tablet  3  . metFORMIN (GLUCOPHAGE) 500 MG tablet Take 1 tablet (500 mg total) by mouth 2 (two) times daily with a meal.  180 tablet  3  . ciprofloxacin (CIPRO) 500 MG tablet Take 1 tablet (500 mg total) by mouth 2 (two) times daily.  10 tablet  0   No facility-administered medications prior to visit.   Allergies  Allergen Reactions  . Flagyl [Metronidazole] Other (See Comments)    Caused increased heart rate  . Aspirin  Effervescent Swelling    Alka-Seltzer gel caps- sore mouth, face swollen, turned hands white  . Morphine Sulfate     REACTION: hives  . Penicillins     REACTION: rash   Patient Active Problem List   Diagnosis Date Noted  . Obesity (BMI 30-39.9) 12/30/2012  . Partial seizure disorder 08/21/2011  . Meningioma 08/13/2011  . DIZZINESS 11/02/2009  . DIABETES MELLITUS, TYPE II 05/20/2009  . HYPOKALEMIA 05/02/2009  . DEPRESSION 06/24/2008  . GERD 06/24/2008  . PEPTIC ULCER DISEASE 06/24/2008  . TMJ SYNDROME 06/09/2008  . HYPERLIPIDEMIA 11/08/2006  . OBSTRUCTIVE SLEEP APNEA 11/08/2006  . HYPERTENSION 11/08/2006  . ALLERGIC RHINITIS 11/08/2006  . VOCAL CORD DISORDER 11/08/2006  . ASTHMA 11/08/2006  . TACHYARRHYTHMIA 11/08/2006   History  Substance Use Topics  . Smoking status: Never Smoker   . Smokeless tobacco: Never Used  . Alcohol Use: No   family history includes Anesthesia problems in her daughter; Arthritis in her other; Cancer in her mother; Diabetes in her other; Heart disease (age of onset: 43) in her father; Hyperlipidemia in her other; Hypertension in her other.      Objective:   Physical Exam  well-developed older white female in no acute distress, pleasant blood pressure 130/74 pulse 84 height 5 foot 7 weight 204. HEENT; nontraumatic normocephalic EOMI PERRLA sclera anicteric, Supple; no JVD, Cardiovascular; regular rate and rhythm with S1-S2 no murmur or gallop, Pulmonary; clear bilaterally, Abdomen; soft basically nontender there is no palpable mass or hepatosplenomegaly bowel sounds are active, Rectal; exam not done, Extraneous; no clubbing cyanosis or edema skin warm and dry, Psych; mood and affect appropriate        Assessment & Plan:  #75  73 year old Afro-American female with multiple medical issues with a resolved episode of mild diverticulitis. She has occasional alteration in her bowel habits but nothing that warrants further investigation at this  time Recent weight loss- overall stable over the past few weeks. Have asked her to monitor her weight and intake and if her weight continues to drift to make followup appointment in about a month with Dr. Deatra Ina #2.onset diabetes mellitus #3 sleep apnea #4 history of meningioma status post resection #5 partial seizure disorder  Plan; No changes for now as above .she is asked to monitor her weight and intake over the next month and if she notices a drop in her weight or decrease in appetite to make a followup appointment for 4-6 weeks

## 2013-03-13 NOTE — Progress Notes (Signed)
Reviewed and agree with management. Nicky Kras D.  Paone, M.D., FACG  

## 2013-04-10 ENCOUNTER — Other Ambulatory Visit: Payer: Self-pay | Admitting: Family Medicine

## 2013-05-25 ENCOUNTER — Encounter: Payer: Self-pay | Admitting: Family Medicine

## 2013-05-25 ENCOUNTER — Ambulatory Visit (INDEPENDENT_AMBULATORY_CARE_PROVIDER_SITE_OTHER)
Admission: RE | Admit: 2013-05-25 | Discharge: 2013-05-25 | Disposition: A | Discharge status: Home / Self Care | Payer: Medicare Other | Source: Ambulatory Visit | Attending: Family Medicine | Admitting: Family Medicine

## 2013-05-25 ENCOUNTER — Ambulatory Visit (INDEPENDENT_AMBULATORY_CARE_PROVIDER_SITE_OTHER): Payer: Medicare Other | Admitting: Family Medicine

## 2013-05-25 VITALS — BP 126/74 | HR 104 | Temp 98.3°F | Wt 184.0 lb

## 2013-05-25 DIAGNOSIS — R059 Cough, unspecified: Secondary | ICD-10-CM

## 2013-05-25 DIAGNOSIS — E0590 Thyrotoxicosis, unspecified without thyrotoxic crisis or storm: Secondary | ICD-10-CM | POA: Insufficient documentation

## 2013-05-25 DIAGNOSIS — R05 Cough: Secondary | ICD-10-CM

## 2013-05-25 NOTE — Patient Instructions (Signed)
Patient advised to return immediately if visualizes black tarry stools or bright red blood in stools.

## 2013-05-25 NOTE — Progress Notes (Signed)
Subjective:    Patient ID: Dawn Salas, female    DOB: 1940-09-28, 73 y.o.   MRN: [id number]  HPI Comments: Patient is a 73 year old female presenting with complaints of lack of appetite x 4 weeks. Associated symptoms include abdominal bloating, gas, fatigue, bilateral ankle swelling, and a scratch in her throat which provokes dry coughing which provokes vomiting. Patient thinks loss of appetite is due to her decreased food intake. She eats half a meal twice a day. Has lost 17 pounds in the past 2-3 months. She is currently been treated for hyperthyroidism by Dr. Posey Pronto in [facility]. PMH of hysterectomy but still has her ovaries. Mother died of ovarian cancer at age 13. Ultrasound normal at OBGYN two weeks ago. No personal history of cancer. Patient denies headache, dizziness, fever, chills, sore throat, and chest pain.   Wants to know if radiation treatment causes hyperpigmentation on palms of hands.   Past Medical History  Diagnosis Date  . Allergy   . Asthma   . Hyperlipidemia   . Hypertension   . Depression   . GERD (gastroesophageal reflux disease)   . Peptic ulcer disease   . Palpitations   . Multiple meningiomas of spine and brain   . Hyperglycemia   . Obstructive sleep apnea     STUDY AT [facility] DOES NOT USE C PAP  . Diabetes mellitus     TYPE ONE  . Hyperthyroidism     PT HAD BIPOSY -NEGATIVE.Marland Kitchen AND NOW JUST GETS CHECKED EACH YEAR .Marland Kitchen 2013 LAST TEST   SEES DR. [name].   Marland Kitchen Arthritis     Past Surgical History  Procedure Laterality Date  . Tonsillectomy    . Tubal ligation    . Right knee arthroscopy      x2  . Right foot bunectomy    . Right shoulder rotator cuff    . L4-l5 posterior la    . C5-c6 neck fusion    . Left foot plantar and hammertoe    . Brain meningioma excision  8/10  . Back surgery      L SPIN 2003   NECK 2004  . Dilation and curettage of uterus      YEARS AGO  . Craniotomy  08/13/2011    Procedure: CRANIOTOMY TUMOR EXCISION;  Surgeon: [name], MD;  Location: [place] [facility];  Service: Neurosurgery;  Laterality: Bilateral;  Bifrontal craniotomy for tumor  . Abdominal hysterectomy        Review of Systems  Constitutional: Positive for appetite change.  HENT: Negative for trouble swallowing.        Scratchy sensation in throat  Respiratory: Positive for cough. Negative for shortness of breath and wheezing.   Cardiovascular: Positive for palpitations. Negative for chest pain.  Gastrointestinal: Positive for vomiting and abdominal pain. Negative for blood in stool.  Genitourinary: Positive for decreased urine volume.  Skin: Positive for color change.  Neurological: Negative for dizziness and headaches.       Objective:   Physical Exam  Vitals reviewed. Constitutional: She appears well-developed and well-nourished.  HENT:  Head: Normocephalic.  Eyes: Pupils are equal, round, and reactive to light.  Neck: Thyromegaly present.  Cardiovascular: Normal heart sounds.  Tachycardia present.   Pulses:      Radial pulses are 2+ on the right side, and 2+ on the left side.  Pulse 112.   Pulmonary/Chest: Effort normal.  LLF, LUF expiratory high pitched breath sound but not wheeze.  Abdominal: Soft. Normal appearance and bowel sounds are normal. There is tenderness in the right lower quadrant. There is no guarding.  Musculoskeletal:       Right foot: She exhibits swelling. She exhibits no tenderness.       Left foot: She exhibits swelling. She exhibits no tenderness.  Trace ankle edema bilaterally  Skin: Skin is warm and dry. She is not diaphoretic.  A few hyperpigmentated macules on palms of hands.        Assessment & Plan:  1. Cough CXR today due to asymmetric high pitched expiratory breath sounds in LUF and LLF.  2. Loss of Appetite Less suspicious of ovarian cancer because of normal ultrasound at OBGYN two weeks ago. More likely due to hyperthyroidism. Will carefully monitor. Patient advised to return immediately if  visualizes black tarry stools or bright red blood in stools.   Audelia Acton, PA-S  Patient with recent dx of hyperthyroidism followed by endocrinolgy in [facility].  Recently treated just few weeks ago with radioactive iodine.  She has weight loss and decreased appetite and suspect related to her thyroid disease.  Recent pelvic ultrasound per patient normal.  She has some mild dry cough and asymmetric expiratory breath sounds of left lung as above- no rales or true wheezes. Will check CXR.  Carolann Littler, MD

## 2013-05-25 NOTE — Progress Notes (Signed)
Pre visit review using our clinic review tool, if applicable. No additional management support is needed unless otherwise documented below in the visit note. 

## 2013-06-30 ENCOUNTER — Ambulatory Visit (INDEPENDENT_AMBULATORY_CARE_PROVIDER_SITE_OTHER): Payer: Medicare Other | Admitting: Family Medicine

## 2013-06-30 ENCOUNTER — Encounter: Payer: Self-pay | Admitting: Family Medicine

## 2013-06-30 VITALS — BP 132/82 | HR 105 | Temp 98.3°F | Wt 195.0 lb

## 2013-06-30 DIAGNOSIS — I10 Essential (primary) hypertension: Secondary | ICD-10-CM

## 2013-06-30 DIAGNOSIS — E119 Type 2 diabetes mellitus without complications: Secondary | ICD-10-CM

## 2013-06-30 DIAGNOSIS — Z23 Encounter for immunization: Secondary | ICD-10-CM

## 2013-06-30 DIAGNOSIS — L819 Disorder of pigmentation, unspecified: Secondary | ICD-10-CM

## 2013-06-30 DIAGNOSIS — L818 Other specified disorders of pigmentation: Secondary | ICD-10-CM

## 2013-06-30 LAB — HEMOGLOBIN A1C: Hgb A1c MFr Bld: 6.4 % (ref 4.6–6.5)

## 2013-06-30 NOTE — Progress Notes (Signed)
Pre visit review using our clinic review tool, if applicable. No additional management support is needed unless otherwise documented below in the visit note. 

## 2013-06-30 NOTE — Progress Notes (Signed)
Subjective:    Patient ID: Dawn Salas, female    DOB: 29-Mar-1940, 73 y.o.   MRN: [id number]  HPI Recently diagnosed with hyperthyroidism and followed by endocrinologist. Radioactive iodine treatment in April. She still has some hot flashes. She had thyroid blood work yesterday apparently. She did lose some weight but is slowly gaining this back. She has not gone on thyroid replacement yet.  Type 2 diabetes. History of good control. Last A1c 6.9%. Takes metformin. Fasting blood sugars usually 110-130. No symptoms of hyperglycemia.  Needs tetanus booster. Also needs Prevnar 13.  Hypertension which has been fairly well controlled. She was recently tapered off metoprolol by cardiologist. She remains on losartan HCTZ and amlodipine. No dizziness. No headaches. No chest pains.  She has some asymptomatic whitish colored spots on both legs. Present for several years. Nonscaly.  Past Medical History  Diagnosis Date  . Allergy   . Asthma   . Hyperlipidemia   . Hypertension   . Depression   . GERD (gastroesophageal reflux disease)   . Peptic ulcer disease   . Palpitations   . Multiple meningiomas of spine and brain   . Hyperglycemia   . Obstructive sleep apnea     STUDY AT [facility] DOES NOT USE C PAP  . Diabetes mellitus     TYPE ONE  . Hyperthyroidism     PT HAD BIPOSY -NEGATIVE.Marland Kitchen AND NOW JUST GETS CHECKED EACH YEAR .Marland Kitchen 2013 LAST TEST   SEES DR. [name].   Marland Kitchen Arthritis    Past Surgical History  Procedure Laterality Date  . Tonsillectomy    . Tubal ligation    . Right knee arthroscopy      x2  . Right foot bunectomy    . Right shoulder rotator cuff    . L4-l5 posterior la    . C5-c6 neck fusion    . Left foot plantar and hammertoe    . Brain meningioma excision  8/10  . Back surgery      L SPIN 2003   NECK 2004  . Dilation and curettage of uterus      YEARS AGO  . Craniotomy  08/13/2011    Procedure: CRANIOTOMY TUMOR EXCISION;  Surgeon: [name], MD;  Location: [place]  [facility];  Service: Neurosurgery;  Laterality: Bilateral;  Bifrontal craniotomy for tumor  . Abdominal hysterectomy      reports that she has never smoked. She has never used smokeless tobacco. She reports that she does not drink alcohol or use illicit drugs. family history includes Anesthesia problems in her daughter; Arthritis in her other; Cancer in her mother; Diabetes in her other; Heart disease (age of onset: 63) in her father; Hyperlipidemia in her other; Hypertension in her other. Allergies  Allergen Reactions  . Flagyl [Metronidazole] Other (See Comments)    Caused increased heart rate  . Aspirin Effervescent Swelling    Alka-Seltzer gel caps- sore mouth, face swollen, turned hands white  . Morphine Sulfate     REACTION: hives  . Penicillins     REACTION: rash      Review of Systems  Constitutional: Negative for fatigue.  Eyes: Negative for visual disturbance.  Respiratory: Negative for cough, chest tightness, shortness of breath and wheezing.   Cardiovascular: Negative for chest pain, palpitations and leg swelling.  Endocrine: Negative for polydipsia and polyuria.  Neurological: Negative for dizziness, seizures, syncope, weakness, light-headedness and headaches.       Objective:   Physical Exam  Constitutional: She appears well-developed and well-nourished.  Cardiovascular: Normal rate and regular rhythm.   Pulmonary/Chest: Effort normal and breath sounds normal. No respiratory distress. She has no wheezes. She has no rales.  Musculoskeletal: She exhibits no edema.  Skin:  Feet reveal no skin lesions. Good distal foot pulses. Good capillary refill. No calluses. Normal sensation with monofilament testing  She has some scattered punctate hypopigmented areas lower legs. These are very small bowel 1-2 mm diameter. Nonscaly          Assessment & Plan:  #1 hypertension. Stable. Continue weight control efforts #2 type 2 diabetes. Recheck A1c. Recent eye exam  normal. #3 recent hyperthyroidism status post radioactive iodine treatment. Symptomatically better. 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 Reassurance #5 health maintenance. Tetanus booster and Prevnar 13 given.

## 2013-07-06 ENCOUNTER — Other Ambulatory Visit: Payer: Self-pay | Admitting: Family Medicine

## 2013-07-21 ENCOUNTER — Telehealth: Payer: Self-pay | Admitting: *Deleted

## 2013-07-21 DIAGNOSIS — E119 Type 2 diabetes mellitus without complications: Secondary | ICD-10-CM

## 2013-07-21 DIAGNOSIS — E785 Hyperlipidemia, unspecified: Secondary | ICD-10-CM

## 2013-07-21 NOTE — Telephone Encounter (Signed)
Patient is going to [facility] lab for a lipid panel.  Lipid panel ordered.

## 2013-07-23 ENCOUNTER — Other Ambulatory Visit (INDEPENDENT_AMBULATORY_CARE_PROVIDER_SITE_OTHER): Payer: Medicare Other

## 2013-07-23 DIAGNOSIS — E785 Hyperlipidemia, unspecified: Secondary | ICD-10-CM

## 2013-07-23 DIAGNOSIS — E119 Type 2 diabetes mellitus without complications: Secondary | ICD-10-CM

## 2013-07-23 LAB — LIPID PANEL
Cholesterol: 343 mg/dL — ABNORMAL HIGH (ref 0–200)
HDL: 109.6 mg/dL (ref >39.00)
LDL Cholesterol: 222 mg/dL — ABNORMAL HIGH (ref 0–99)
NonHDL: 233.4
Total CHOL/HDL Ratio: 3
Triglycerides: 59 mg/dL (ref 0.0–149.0)
VLDL: 11.8 mg/dL (ref 0.0–40.0)

## 2013-09-17 ENCOUNTER — Encounter: Payer: Self-pay | Admitting: Gastroenterology

## 2013-10-04 ENCOUNTER — Other Ambulatory Visit: Payer: Self-pay | Admitting: Family Medicine

## 2013-11-10 ENCOUNTER — Encounter: Payer: Self-pay | Admitting: Family Medicine

## 2013-11-10 ENCOUNTER — Ambulatory Visit (INDEPENDENT_AMBULATORY_CARE_PROVIDER_SITE_OTHER): Payer: Medicare Other | Admitting: Family Medicine

## 2013-11-10 VITALS — BP 130/80 | HR 82 | Temp 97.9°F | Wt 211.0 lb

## 2013-11-10 DIAGNOSIS — R252 Cramp and spasm: Secondary | ICD-10-CM

## 2013-11-10 DIAGNOSIS — Z23 Encounter for immunization: Secondary | ICD-10-CM

## 2013-11-10 LAB — BASIC METABOLIC PANEL
BUN: 15 mg/dL (ref 6–23)
CO2: 24 mEq/L (ref 19–32)
Calcium: 9.6 mg/dL (ref 8.4–10.5)
Chloride: 102 mEq/L (ref 96–112)
Creatinine, Ser: 1.3 mg/dL — ABNORMAL HIGH (ref 0.4–1.2)
GFR: 54.34 mL/min — ABNORMAL LOW (ref >60.00)
Glucose, Bld: 77 mg/dL (ref 70–99)
Potassium: 4.3 mEq/L (ref 3.5–5.1)
Sodium: 142 mEq/L (ref 135–145)

## 2013-11-10 LAB — MAGNESIUM: Magnesium: 1.9 mg/dL (ref 1.5–2.5)

## 2013-11-10 NOTE — Patient Instructions (Signed)
Leg Cramps Leg cramps that occur during exercise can be caused by poor circulation or dehydration. However, muscle cramps that occur at rest or during the night are usually not due to any serious medical problem. Heat cramps may cause muscle spasms during hot weather.  CAUSES There is no clear cause for muscle cramps. However, dehydration may be a factor for those who do not drink enough fluids and those who exercise in the heat. Imbalances in the level of sodium, potassium, calcium or magnesium in the muscle tissue may also be a factor. Some medications, such as water pills (diuretics), may cause loss of chemicals that the body needs (like sodium and potassium) and cause muscle cramps. TREATMENT   Make sure your diet has enough fluids and essential minerals for the muscle to work normally.  Avoid strenuous exercise for several days if you have been having frequent leg cramps.  Stretch and massage the cramped muscle for several minutes.  Some medicines may be helpful in some patients with night cramps. Only take over-the-counter or prescription medicines as directed by your caregiver. SEEK IMMEDIATE MEDICAL CARE IF:   Your leg cramps become worse.  Your foot becomes cold, numb, or blue. Document Released: 02/16/2004 Document Revised: 04/02/2011 Document Reviewed: 02/03/2008 ExitCare Patient Information 2015 [employer]. This information is not intended to replace advice given to you by your health care provider. Make sure you discuss any questions you have with your health care provider.  

## 2013-11-10 NOTE — Progress Notes (Signed)
Pre visit review using our clinic review tool, if applicable. No additional management support is needed unless otherwise documented below in the visit note. 

## 2013-11-10 NOTE — Progress Notes (Signed)
Subjective:    Patient ID: Dawn Salas, female    DOB: 01/26/1940, 73 y.o.   MRN: [id number]  HPI Patient is here for bilateral upper and lower extremity muscle cramps. These are diffuse involving hands calf muscles and occasionally feet. She's had these of the past several weeks. No recent change in medications. She takes HCTZ for hypertension but no other diuretics. Good fluid intake. She saw her cardiologist and had recent ABIs which were normal. She's not describing any claudication symptoms. Her symptoms occur at rest and frequently at night. She has not had any history of known low magnesium but no recent levels. Recent vitamin D level within the past year normal. No alleviating factors. She takes a multivitamin once daily.  She did recently go back on low-dose Crestor but only taking this once per week and she is describing discrete muscle cramps as opposed to diffuse constant myalgias.  Past Medical History  Diagnosis Date  . Allergy   . Asthma   . Hyperlipidemia   . Hypertension   . Depression   . GERD (gastroesophageal reflux disease)   . Peptic ulcer disease   . Palpitations   . Multiple meningiomas of spine and brain   . Hyperglycemia   . Obstructive sleep apnea     STUDY AT [facility] DOES NOT USE C PAP  . Diabetes mellitus     TYPE ONE  . Hyperthyroidism     PT HAD BIPOSY -NEGATIVE.Marland Kitchen AND NOW JUST GETS CHECKED EACH YEAR .Marland Kitchen 2013 LAST TEST   SEES DR. [name].   Marland Kitchen Arthritis    Past Surgical History  Procedure Laterality Date  . Tonsillectomy    . Tubal ligation    . Right knee arthroscopy      x2  . Right foot bunectomy    . Right shoulder rotator cuff    . L4-l5 posterior la    . C5-c6 neck fusion    . Left foot plantar and hammertoe    . Brain meningioma excision  8/10  . Back surgery      L SPIN 2003   NECK 2004  . Dilation and curettage of uterus      YEARS AGO  . Craniotomy  08/13/2011    Procedure: CRANIOTOMY TUMOR EXCISION;  Surgeon: [name],  MD;  Location: [place] [facility];  Service: Neurosurgery;  Laterality: Bilateral;  Bifrontal craniotomy for tumor  . Abdominal hysterectomy      reports that she has never smoked. She has never used smokeless tobacco. She reports that she does not drink alcohol or use illicit drugs. family history includes Anesthesia problems in her daughter; Arthritis in her other; Cancer in her mother; Diabetes in her other; Heart disease (age of onset: 63) in her father; Hyperlipidemia in her other; Hypertension in her other. Allergies  Allergen Reactions  . Flagyl [Metronidazole] Other (See Comments)    Caused increased heart rate  . Aspirin Effervescent Swelling    Alka-Seltzer gel caps- sore mouth, face swollen, turned hands white  . Morphine Sulfate     REACTION: hives  . Penicillins     REACTION: rash      Review of Systems  Constitutional: Negative for fatigue.  Eyes: Negative for visual disturbance.  Respiratory: Negative for cough, chest tightness, shortness of breath and wheezing.   Cardiovascular: Negative for chest pain, palpitations and leg swelling.  Neurological: Negative for dizziness, seizures, syncope, weakness, light-headedness and headaches.       Objective:  Physical Exam  Constitutional: She appears well-developed and well-nourished.  Neck: Neck supple. No thyromegaly present.  Cardiovascular: Normal rate and regular rhythm.   Pulmonary/Chest: Effort normal and breath sounds normal. No respiratory distress. She has no wheezes. She has no rales.  Musculoskeletal: She exhibits no edema.  Skin:  Patient small elevated epidermal cyst right upper thoracic area. No fluctuance no erythema. Nontender.          Assessment & Plan:  Diffuse muscle cramps. We'll check basic metabolic panel and magnesium level. She appears to be well-hydrated. Handout given.  Epidermal cyst which has recurred. Not infected. We've offered excision down the road if she is interested but reassured  her this is benign

## 2013-11-20 ENCOUNTER — Other Ambulatory Visit: Payer: Self-pay | Admitting: Family Medicine

## 2013-11-21 ENCOUNTER — Other Ambulatory Visit: Payer: Self-pay | Admitting: Family Medicine

## 2013-12-03 ENCOUNTER — Other Ambulatory Visit (HOSPITAL_COMMUNITY): Payer: Self-pay | Admitting: Neurosurgery

## 2013-12-11 ENCOUNTER — Encounter (HOSPITAL_COMMUNITY)
Admission: RE | Admit: 2013-12-11 | Discharge: 2013-12-11 | Disposition: A | Discharge status: Home / Self Care | Payer: Medicare Other | Source: Ambulatory Visit | Attending: Neurosurgery | Admitting: Neurosurgery

## 2013-12-11 ENCOUNTER — Encounter (HOSPITAL_COMMUNITY): Payer: Self-pay

## 2013-12-11 DIAGNOSIS — M4316 Spondylolisthesis, lumbar region: Secondary | ICD-10-CM | POA: Diagnosis not present

## 2013-12-11 HISTORY — DX: Pneumonia, unspecified organism: J18.9

## 2013-12-11 HISTORY — DX: Diverticulitis of intestine, part unspecified, without perforation or abscess without bleeding: K57.92

## 2013-12-11 HISTORY — DX: Frequency of micturition: R35.0

## 2013-12-11 HISTORY — DX: Unspecified convulsions: R56.9

## 2013-12-11 HISTORY — DX: Personal history of other diseases of the digestive system: Z87.19

## 2013-12-11 LAB — CBC
HCT: 35.1 % — ABNORMAL LOW (ref 36.0–46.0)
Hemoglobin: 11.7 g/dL — ABNORMAL LOW (ref 12.0–15.0)
MCH: 26.5 pg (ref 26.0–34.0)
MCHC: 33.3 g/dL (ref 30.0–36.0)
MCV: 79.6 fL (ref 78.0–100.0)
Platelets: 173 10*3/uL (ref 150–400)
RBC: 4.41 MIL/uL (ref 3.87–5.11)
RDW: 13.2 % (ref 11.5–15.5)
WBC: 3.3 10*3/uL — ABNORMAL LOW (ref 4.0–10.5)

## 2013-12-11 LAB — SURGICAL PCR SCREEN
MRSA, PCR: NEGATIVE
Staphylococcus aureus: NEGATIVE

## 2013-12-11 LAB — BASIC METABOLIC PANEL
Anion gap: 15 (ref 5–15)
BUN: 14 mg/dL (ref 6–23)
CO2: 24 mEq/L (ref 19–32)
Calcium: 9.4 mg/dL (ref 8.4–10.5)
Chloride: 99 mEq/L (ref 96–112)
Creatinine, Ser: 1.24 mg/dL — ABNORMAL HIGH (ref 0.50–1.10)
GFR calc Af Amer: 49 mL/min — ABNORMAL LOW (ref >90)
GFR calc non Af Amer: 43 mL/min — ABNORMAL LOW (ref >90)
Glucose, Bld: 97 mg/dL (ref 70–99)
Potassium: 3.8 mEq/L (ref 3.7–5.3)
Sodium: 138 mEq/L (ref 137–147)

## 2013-12-11 LAB — TYPE AND SCREEN
ABO/RH(D): O POS
Antibody Screen: NEGATIVE

## 2013-12-11 NOTE — Progress Notes (Signed)
Patient informed Nurse that she had a stress test, but denied having a cardiac cath. Encounter in [place]. Echo also in [facility] 06/20/11. Patient informed Nurse that she has sleep apnea but does not wear CPAP as the mask does not properly fit her face. PCP is Bruce Burchette. Cardiologist is Lawson Radar at [facility]. Endocrinologist is Forensic scientist at [employer]. Patient requested that Nurse call Janett Billow at Dr. Arnoldo Morale office to inquire about brace that patient needed for surgery. Nurse called Janett Billow and Janett Billow requested that patient go to [place] today after leaving PAT for brace. Nurse informed patient of this. Patient verbalized understanding.

## 2013-12-11 NOTE — Pre-Procedure Instructions (Signed)
Dawn Salas  12/11/2013   Your procedure is scheduled on:  Wednesday December 23, 2013 at 12:50 PM.  Report to [facility] Admitting at 9:45 AM.  Call this number if you have problems the morning of surgery: [phone]   Remember:   Do not eat food or drink liquids after midnight.   Take these medicines the morning of surgery with A SIP OF WATER: Aciphex, Albuterol inhaler if needed, Amlodipine (Norvasc), Cetirizine (Zyrtec) if needed, Diazepam (Valium) if needed, Flonase nasal spray, Levothyroxine (Synthroid), Meclizine (Antivert) if needed, Metoprolol (Toprol XL), Valacyclovir (Valtrex) if needed, and Vesicare   Do NOT take any diabetic medications the morning of your surgery   Stop taking any aspirin or herbal medications 7 days before surgery 12/16/13 (Ex. Vitamins, Advil, Motrin, Alleve,etc)   Do not wear jewelry, make-up or nail polish.  Do not wear lotions, powders, or perfumes.   Do not shave 48 hours prior to surgery.   Do not bring valuables to the hospital.  [facility] is not responsible for any belongings or valuables.               Contacts, dentures or bridgework may not be worn into surgery.  Leave suitcase in the car. After surgery it may be brought to your room.  For patients admitted to the hospital, discharge time is determined by your treatment team.               Patients discharged the day of surgery will not be allowed to drive home.  Name and phone number of your driver:   Special Instructions: Shower using CHG soap the night before and the morning of your surgery   Please read over the following fact sheets that you were given: Pain Booklet, Coughing and Deep Breathing, Blood Transfusion Information, MRSA Information and Surgical Site Infection Prevention

## 2013-12-14 LAB — HM DIABETES EYE EXAM

## 2013-12-15 ENCOUNTER — Encounter: Payer: Self-pay | Admitting: Family Medicine

## 2013-12-22 MED ORDER — VANCOMYCIN HCL IN DEXTROSE 1-5 GM/200ML-% IV SOLN
1000.0000 mg | INTRAVENOUS | Status: AC
  Administered 2013-12-23: 1000 mg via INTRAVENOUS
  Filled 2013-12-22: qty 200

## 2013-12-23 ENCOUNTER — Inpatient Hospital Stay (HOSPITAL_COMMUNITY): Payer: Medicare Other

## 2013-12-23 ENCOUNTER — Encounter (HOSPITAL_COMMUNITY): Payer: Self-pay | Admitting: *Deleted

## 2013-12-23 ENCOUNTER — Inpatient Hospital Stay (HOSPITAL_COMMUNITY): Payer: Medicare Other | Admitting: Anesthesiology

## 2013-12-23 ENCOUNTER — Inpatient Hospital Stay (HOSPITAL_COMMUNITY)
Admission: RE | Admit: 2013-12-23 | Discharge: 2013-12-25 | DRG: 460 | Disposition: A | Discharge status: Home / Self Care | Payer: Medicare Other | Source: Ambulatory Visit | Attending: Neurosurgery | Admitting: Neurosurgery

## 2013-12-23 ENCOUNTER — Encounter (HOSPITAL_COMMUNITY)
Admission: RE | Disposition: A | Discharge status: Home / Self Care | Payer: Medicare Other | Source: Ambulatory Visit | Attending: Neurosurgery

## 2013-12-23 DIAGNOSIS — M4316 Spondylolisthesis, lumbar region: Secondary | ICD-10-CM | POA: Diagnosis present

## 2013-12-23 DIAGNOSIS — Z833 Family history of diabetes mellitus: Secondary | ICD-10-CM

## 2013-12-23 DIAGNOSIS — E119 Type 2 diabetes mellitus without complications: Secondary | ICD-10-CM | POA: Diagnosis present

## 2013-12-23 DIAGNOSIS — M4806 Spinal stenosis, lumbar region: Secondary | ICD-10-CM | POA: Diagnosis present

## 2013-12-23 DIAGNOSIS — I10 Essential (primary) hypertension: Secondary | ICD-10-CM | POA: Diagnosis present

## 2013-12-23 DIAGNOSIS — M5116 Intervertebral disc disorders with radiculopathy, lumbar region: Secondary | ICD-10-CM | POA: Diagnosis present

## 2013-12-23 DIAGNOSIS — K219 Gastro-esophageal reflux disease without esophagitis: Secondary | ICD-10-CM | POA: Diagnosis present

## 2013-12-23 DIAGNOSIS — E785 Hyperlipidemia, unspecified: Secondary | ICD-10-CM | POA: Diagnosis present

## 2013-12-23 DIAGNOSIS — Z419 Encounter for procedure for purposes other than remedying health state, unspecified: Secondary | ICD-10-CM

## 2013-12-23 LAB — GLUCOSE, CAPILLARY
Glucose-Capillary: 100 mg/dL — ABNORMAL HIGH (ref 70–99)
Glucose-Capillary: 100 mg/dL — ABNORMAL HIGH (ref 70–99)
Glucose-Capillary: 119 mg/dL — ABNORMAL HIGH (ref 70–99)
Glucose-Capillary: 215 mg/dL — ABNORMAL HIGH (ref 70–99)

## 2013-12-23 SURGERY — POSTERIOR LUMBAR FUSION 1 LEVEL
Anesthesia: General | Site: Back

## 2013-12-23 MED ORDER — DEXAMETHASONE SODIUM PHOSPHATE 4 MG/ML IJ SOLN
INTRAMUSCULAR | Status: DC | PRN
  Administered 2013-12-23: 4 mg via INTRAVENOUS

## 2013-12-23 MED ORDER — LIDOCAINE HCL (CARDIAC) 20 MG/ML IV SOLN
INTRAVENOUS | Status: DC | PRN
  Administered 2013-12-23: 80 mg via INTRAVENOUS
  Administered 2013-12-23: 20 mg via INTRAVENOUS

## 2013-12-23 MED ORDER — AMLODIPINE BESYLATE 5 MG PO TABS
5.0000 mg | ORAL_TABLET | Freq: Every day | ORAL | Status: DC
  Administered 2013-12-23 – 2013-12-25 (×3): 5 mg via ORAL
  Filled 2013-12-23 (×3): qty 1

## 2013-12-23 MED ORDER — LOSARTAN POTASSIUM-HCTZ 100-12.5 MG PO TABS: 1.0000 | ORAL_TABLET | Freq: Every day | ORAL | Status: DC

## 2013-12-23 MED ORDER — NEOSTIGMINE METHYLSULFATE 10 MG/10ML IV SOLN
INTRAVENOUS | Status: DC | PRN
  Administered 2013-12-23: 3 mg via INTRAVENOUS

## 2013-12-23 MED ORDER — LACTATED RINGERS IV SOLN: INTRAVENOUS | Status: DC

## 2013-12-23 MED ORDER — PHENYLEPHRINE HCL 10 MG/ML IJ SOLN
INTRAMUSCULAR | Status: DC | PRN
  Administered 2013-12-23: 120 ug via INTRAVENOUS
  Administered 2013-12-23 (×3): 80 ug via INTRAVENOUS

## 2013-12-23 MED ORDER — BACITRACIN ZINC 500 UNIT/GM EX OINT
TOPICAL_OINTMENT | CUTANEOUS | Status: DC | PRN
  Administered 2013-12-23: 1 via TOPICAL

## 2013-12-23 MED ORDER — FLUTICASONE PROPIONATE 50 MCG/ACT NA SUSP
2.0000 | Freq: Every day | NASAL | Status: DC | PRN
  Filled 2013-12-23: qty 16

## 2013-12-23 MED ORDER — DARIFENACIN HYDROBROMIDE ER 7.5 MG PO TB24
7.5000 mg | ORAL_TABLET | Freq: Every day | ORAL | Status: DC
  Administered 2013-12-24 – 2013-12-25 (×2): 7.5 mg via ORAL
  Filled 2013-12-23 (×2): qty 1

## 2013-12-23 MED ORDER — ALBUTEROL 90 MCG/ACT IN AERS: 2.0000 | INHALATION_SPRAY | Freq: Four times a day (QID) | RESPIRATORY_TRACT | Status: DC | PRN

## 2013-12-23 MED ORDER — MIDAZOLAM HCL 5 MG/5ML IJ SOLN
INTRAMUSCULAR | Status: DC | PRN
  Administered 2013-12-23: 2 mg via INTRAVENOUS

## 2013-12-23 MED ORDER — FENTANYL CITRATE 0.05 MG/ML IJ SOLN
INTRAMUSCULAR | Status: AC
  Filled 2013-12-23: qty 5

## 2013-12-23 MED ORDER — VANCOMYCIN HCL 10 G IV SOLR
1500.0000 mg | INTRAVENOUS | Status: DC
  Administered 2013-12-23: 1500 mg via INTRAVENOUS
  Filled 2013-12-23 (×2): qty 1500

## 2013-12-23 MED ORDER — DOCUSATE SODIUM 100 MG PO CAPS
100.0000 mg | ORAL_CAPSULE | Freq: Two times a day (BID) | ORAL | Status: DC
  Administered 2013-12-23 – 2013-12-25 (×4): 100 mg via ORAL
  Filled 2013-12-23 (×5): qty 1

## 2013-12-23 MED ORDER — MECLIZINE HCL 25 MG PO TABS
25.0000 mg | ORAL_TABLET | Freq: Three times a day (TID) | ORAL | Status: DC | PRN
  Filled 2013-12-23: qty 1

## 2013-12-23 MED ORDER — VECURONIUM BROMIDE 10 MG IV SOLR
INTRAVENOUS | Status: DC | PRN
  Administered 2013-12-23: 2 mg via INTRAVENOUS
  Administered 2013-12-23: 3 mg via INTRAVENOUS

## 2013-12-23 MED ORDER — BUPIVACAINE LIPOSOME 1.3 % IJ SUSP
20.0000 mL | INTRAMUSCULAR | Status: AC
  Administered 2013-12-23: 20 mL
  Filled 2013-12-23: qty 20

## 2013-12-23 MED ORDER — HYDROCHLOROTHIAZIDE 12.5 MG PO CAPS
12.5000 mg | ORAL_CAPSULE | Freq: Every day | ORAL | Status: DC
  Administered 2013-12-23 – 2013-12-25 (×3): 12.5 mg via ORAL
  Filled 2013-12-23 (×3): qty 1

## 2013-12-23 MED ORDER — MENTHOL 3 MG MT LOZG: 1.0000 | LOZENGE | OROMUCOSAL | Status: DC | PRN

## 2013-12-23 MED ORDER — ACETAMINOPHEN 650 MG RE SUPP: 650.0000 mg | RECTAL | Status: DC | PRN

## 2013-12-23 MED ORDER — PANTOPRAZOLE SODIUM 40 MG PO TBEC
40.0000 mg | DELAYED_RELEASE_TABLET | Freq: Every day | ORAL | Status: DC
  Administered 2013-12-24 – 2013-12-25 (×2): 40 mg via ORAL
  Filled 2013-12-23: qty 1

## 2013-12-23 MED ORDER — BACITRACIN 50000 UNITS IM SOLR
INTRAMUSCULAR | Status: DC | PRN
  Administered 2013-12-23: 15:00:00

## 2013-12-23 MED ORDER — ALUM & MAG HYDROXIDE-SIMETH 200-200-20 MG/5ML PO SUSP: 30.0000 mL | Freq: Four times a day (QID) | ORAL | Status: DC | PRN

## 2013-12-23 MED ORDER — HYDROMORPHONE HCL 1 MG/ML IJ SOLN: 1.0000 mg | INTRAMUSCULAR | Status: DC | PRN

## 2013-12-23 MED ORDER — GLYCOPYRROLATE 0.2 MG/ML IJ SOLN
INTRAMUSCULAR | Status: DC | PRN
  Administered 2013-12-23: .4 mg via INTRAVENOUS

## 2013-12-23 MED ORDER — THROMBIN 20000 UNITS EX SOLR
CUTANEOUS | Status: DC | PRN
  Administered 2013-12-23: 15:00:00 via TOPICAL

## 2013-12-23 MED ORDER — ALBUTEROL SULFATE (2.5 MG/3ML) 0.083% IN NEBU: 2.5000 mg | INHALATION_SOLUTION | Freq: Four times a day (QID) | RESPIRATORY_TRACT | Status: DC | PRN

## 2013-12-23 MED ORDER — HYDROCODONE-ACETAMINOPHEN 5-325 MG PO TABS
1.0000 | ORAL_TABLET | ORAL | Status: DC | PRN
  Administered 2013-12-23 – 2013-12-24 (×2): 2 via ORAL
  Administered 2013-12-24: 1 via ORAL
  Administered 2013-12-24 – 2013-12-25 (×3): 2 via ORAL
  Filled 2013-12-23 (×4): qty 2
  Filled 2013-12-23: qty 1
  Filled 2013-12-23: qty 2

## 2013-12-23 MED ORDER — LOSARTAN POTASSIUM 50 MG PO TABS
100.0000 mg | ORAL_TABLET | Freq: Every day | ORAL | Status: DC
  Administered 2013-12-23 – 2013-12-25 (×3): 100 mg via ORAL
  Filled 2013-12-23 (×3): qty 2

## 2013-12-23 MED ORDER — DIAZEPAM 5 MG PO TABS
5.0000 mg | ORAL_TABLET | Freq: Four times a day (QID) | ORAL | Status: DC | PRN
  Administered 2013-12-23 – 2013-12-25 (×5): 5 mg via ORAL
  Filled 2013-12-23 (×5): qty 1

## 2013-12-23 MED ORDER — ROCURONIUM BROMIDE 100 MG/10ML IV SOLN
INTRAVENOUS | Status: DC | PRN
  Administered 2013-12-23: 50 mg via INTRAVENOUS

## 2013-12-23 MED ORDER — LORATADINE 10 MG PO TABS
10.0000 mg | ORAL_TABLET | Freq: Every day | ORAL | Status: DC | PRN
  Filled 2013-12-23: qty 1

## 2013-12-23 MED ORDER — METOPROLOL SUCCINATE ER 50 MG PO TB24
50.0000 mg | ORAL_TABLET | Freq: Every day | ORAL | Status: DC
  Administered 2013-12-24 – 2013-12-25 (×2): 50 mg via ORAL
  Filled 2013-12-23 (×2): qty 1

## 2013-12-23 MED ORDER — POTASSIUM CHLORIDE ER 10 MEQ PO TBCR
10.0000 meq | EXTENDED_RELEASE_TABLET | Freq: Once | ORAL | Status: AC
  Administered 2013-12-23: 10 meq via ORAL
  Filled 2013-12-23: qty 1

## 2013-12-23 MED ORDER — BUPIVACAINE-EPINEPHRINE (PF) 0.5% -1:200000 IJ SOLN
INTRAMUSCULAR | Status: DC | PRN
  Administered 2013-12-23: 10 mL

## 2013-12-23 MED ORDER — 0.9 % SODIUM CHLORIDE (POUR BTL) OPTIME
TOPICAL | Status: DC | PRN
  Administered 2013-12-23: 1000 mL

## 2013-12-23 MED ORDER — METFORMIN HCL 500 MG PO TABS
500.0000 mg | ORAL_TABLET | Freq: Two times a day (BID) | ORAL | Status: DC
  Administered 2013-12-24 – 2013-12-25 (×3): 500 mg via ORAL
  Filled 2013-12-23 (×5): qty 1

## 2013-12-23 MED ORDER — LEVOTHYROXINE SODIUM 100 MCG PO TABS
100.0000 ug | ORAL_TABLET | Freq: Every day | ORAL | Status: DC
  Administered 2013-12-24 – 2013-12-25 (×2): 100 ug via ORAL
  Filled 2013-12-23 (×3): qty 1

## 2013-12-23 MED ORDER — HYDROMORPHONE HCL 1 MG/ML IJ SOLN
0.2500 mg | INTRAMUSCULAR | Status: DC | PRN
  Administered 2013-12-23 (×2): 0.5 mg via INTRAVENOUS

## 2013-12-23 MED ORDER — PROPOFOL 10 MG/ML IV BOLUS
INTRAVENOUS | Status: DC | PRN
  Administered 2013-12-23: 160 mg via INTRAVENOUS

## 2013-12-23 MED ORDER — ONDANSETRON HCL 4 MG/2ML IJ SOLN: 4.0000 mg | INTRAMUSCULAR | Status: DC | PRN

## 2013-12-23 MED ORDER — PROMETHAZINE HCL 25 MG/ML IJ SOLN: 6.2500 mg | INTRAMUSCULAR | Status: DC | PRN

## 2013-12-23 MED ORDER — FUROSEMIDE 20 MG PO TABS
20.0000 mg | ORAL_TABLET | Freq: Every day | ORAL | Status: DC
  Administered 2013-12-23 – 2013-12-25 (×3): 20 mg via ORAL
  Filled 2013-12-23 (×3): qty 1

## 2013-12-23 MED ORDER — ROSUVASTATIN CALCIUM 10 MG PO TABS
10.0000 mg | ORAL_TABLET | ORAL | Status: DC
  Administered 2013-12-23: 10 mg via ORAL
  Filled 2013-12-23: qty 1

## 2013-12-23 MED ORDER — MIDAZOLAM HCL 2 MG/2ML IJ SOLN
INTRAMUSCULAR | Status: AC
  Filled 2013-12-23: qty 2

## 2013-12-23 MED ORDER — ACETAMINOPHEN 325 MG PO TABS: 650.0000 mg | ORAL_TABLET | ORAL | Status: DC | PRN

## 2013-12-23 MED ORDER — PHENOL 1.4 % MT LIQD: 1.0000 | OROMUCOSAL | Status: DC | PRN

## 2013-12-23 MED ORDER — OXYCODONE-ACETAMINOPHEN 5-325 MG PO TABS: 1.0000 | ORAL_TABLET | ORAL | Status: DC | PRN

## 2013-12-23 MED ORDER — HYDROMORPHONE HCL 1 MG/ML IJ SOLN
INTRAMUSCULAR | Status: AC
  Filled 2013-12-23: qty 1

## 2013-12-23 MED ORDER — LACTATED RINGERS IV SOLN
INTRAVENOUS | Status: DC
  Administered 2013-12-23 (×3): via INTRAVENOUS

## 2013-12-23 MED ORDER — FENTANYL CITRATE 0.05 MG/ML IJ SOLN
INTRAMUSCULAR | Status: DC | PRN
  Administered 2013-12-23: 150 ug via INTRAVENOUS
  Administered 2013-12-23: 100 ug via INTRAVENOUS

## 2013-12-23 MED ORDER — VALACYCLOVIR HCL 500 MG PO TABS
1000.0000 mg | ORAL_TABLET | Freq: Every day | ORAL | Status: DC | PRN
  Filled 2013-12-23: qty 2

## 2013-12-23 MED ORDER — PHENYLEPHRINE HCL 10 MG/ML IJ SOLN
10.0000 mg | INTRAMUSCULAR | Status: DC | PRN
  Administered 2013-12-23: 15 ug/min via INTRAVENOUS

## 2013-12-23 SURGICAL SUPPLY — 68 items
APL SKNCLS STERI-STRIP NONHPOA (GAUZE/BANDAGES/DRESSINGS) ×1
BAG DECANTER FOR FLEXI CONT (MISCELLANEOUS) ×2 IMPLANT
BENZOIN TINCTURE PRP APPL 2/3 (GAUZE/BANDAGES/DRESSINGS) ×2 IMPLANT
BLADE CLIPPER SURG (BLADE) IMPLANT
BRUSH SCRUB EZ PLAIN DRY (MISCELLANEOUS) ×2 IMPLANT
BUR MATCHSTICK NEURO 3.0 LAGG (BURR) ×2 IMPLANT
BUR PRECISION FLUTE 6.0 (BURR) ×2 IMPLANT
CANISTER SUCT 3000ML (MISCELLANEOUS) ×2 IMPLANT
CONT SPEC 4OZ CLIKSEAL STRL BL (MISCELLANEOUS) ×2 IMPLANT
COVER BACK TABLE 60X90IN (DRAPES) ×2 IMPLANT
DRAPE C-ARM 42X72 X-RAY (DRAPES) ×4 IMPLANT
DRAPE LAPAROTOMY 100X72X124 (DRAPES) ×2 IMPLANT
DRAPE POUCH INSTRU U-SHP 10X18 (DRAPES) ×2 IMPLANT
DRAPE PROXIMA HALF (DRAPES) ×2 IMPLANT
DRAPE SURG 17X23 STRL (DRAPES) ×8 IMPLANT
ELECT BLADE 4.0 EZ CLEAN MEGAD (MISCELLANEOUS) ×2
ELECT REM PT RETURN 9FT ADLT (ELECTROSURGICAL) ×2
ELECTRODE BLDE 4.0 EZ CLN MEGD (MISCELLANEOUS) ×1 IMPLANT
ELECTRODE REM PT RTRN 9FT ADLT (ELECTROSURGICAL) ×1 IMPLANT
EVACUATOR 1/8 PVC DRAIN (DRAIN) ×1 IMPLANT
GAUZE SPONGE 4X4 12PLY STRL (GAUZE/BANDAGES/DRESSINGS) ×2 IMPLANT
GAUZE SPONGE 4X4 16PLY XRAY LF (GAUZE/BANDAGES/DRESSINGS) ×1 IMPLANT
GLOVE BIO SURGEON STRL SZ8.5 (GLOVE) ×4 IMPLANT
GLOVE BIOGEL PI IND STRL 7.0 (GLOVE) IMPLANT
GLOVE BIOGEL PI INDICATOR 7.0 (GLOVE) ×2
GLOVE ECLIPSE 6.5 STRL STRAW (GLOVE) ×1 IMPLANT
GLOVE EXAM NITRILE LRG STRL (GLOVE) IMPLANT
GLOVE EXAM NITRILE MD LF STRL (GLOVE) IMPLANT
GLOVE EXAM NITRILE XL STR (GLOVE) IMPLANT
GLOVE EXAM NITRILE XS STR PU (GLOVE) IMPLANT
GLOVE SS BIOGEL STRL SZ 6.5 (GLOVE) IMPLANT
GLOVE SS BIOGEL STRL SZ 8 (GLOVE) ×2 IMPLANT
GLOVE SUPERSENSE BIOGEL SZ 6.5 (GLOVE) ×3
GLOVE SUPERSENSE BIOGEL SZ 8 (GLOVE) ×2
GOWN STRL REUS W/ TWL LRG LVL3 (GOWN DISPOSABLE) IMPLANT
GOWN STRL REUS W/ TWL XL LVL3 (GOWN DISPOSABLE) ×2 IMPLANT
GOWN STRL REUS W/TWL 2XL LVL3 (GOWN DISPOSABLE) IMPLANT
GOWN STRL REUS W/TWL LRG LVL3 (GOWN DISPOSABLE) ×4
GOWN STRL REUS W/TWL XL LVL3 (GOWN DISPOSABLE) ×4
KIT BASIN OR (CUSTOM PROCEDURE TRAY) ×2 IMPLANT
KIT ROOM TURNOVER OR (KITS) ×2 IMPLANT
NDL HYPO 21X1.5 SAFETY (NEEDLE) IMPLANT
NEEDLE HYPO 21X1.5 SAFETY (NEEDLE) IMPLANT
NEEDLE HYPO 22GX1.5 SAFETY (NEEDLE) ×2 IMPLANT
NS IRRIG 1000ML POUR BTL (IV SOLUTION) ×2 IMPLANT
PACK LAMINECTOMY NEURO (CUSTOM PROCEDURE TRAY) ×2 IMPLANT
PAD ARMBOARD 7.5X6 YLW CONV (MISCELLANEOUS) ×6 IMPLANT
PATTIES SURGICAL .5 X1 (DISPOSABLE) IMPLANT
ROD PREBENT 6.35X70 (Rod) ×2 IMPLANT
SCREW DANEK NONBREAK (Screw) ×4 IMPLANT
SCREW PEDICLE VA L635 6.5X50M (Screw) ×2 IMPLANT
SCREW SET BREAK OFF (Screw) ×2 IMPLANT
SPACER SUSTAIN O 10X26 12MM (Spacer) ×2 IMPLANT
SPONGE LAP 4X18 X RAY DECT (DISPOSABLE) IMPLANT
SPONGE NEURO XRAY DETECT 1X3 (DISPOSABLE) IMPLANT
SPONGE SURGIFOAM ABS GEL 100 (HEMOSTASIS) ×2 IMPLANT
STRIP BIOACTIVE 10CC 25X100X4 (Miscellaneous) ×1 IMPLANT
STRIP CLOSURE SKIN 1/2X4 (GAUZE/BANDAGES/DRESSINGS) ×2 IMPLANT
SUT VIC AB 1 CT1 18XBRD ANBCTR (SUTURE) ×2 IMPLANT
SUT VIC AB 1 CT1 8-18 (SUTURE) ×4
SUT VIC AB 2-0 CP2 18 (SUTURE) ×4 IMPLANT
SYR 20CC LL (SYRINGE) IMPLANT
SYR 20ML ECCENTRIC (SYRINGE) ×2 IMPLANT
TAPE CLOTH SURG 4X10 WHT LF (GAUZE/BANDAGES/DRESSINGS) ×1 IMPLANT
TOWEL OR 17X24 6PK STRL BLUE (TOWEL DISPOSABLE) ×2 IMPLANT
TOWEL OR 17X26 10 PK STRL BLUE (TOWEL DISPOSABLE) ×2 IMPLANT
TRAY FOLEY CATH 14FRSI W/METER (CATHETERS) ×2 IMPLANT
WATER STERILE IRR 1000ML POUR (IV SOLUTION) ×2 IMPLANT

## 2013-12-23 NOTE — Transfer of Care (Signed)
Immediate Anesthesia Transfer of Care Note  Patient: [name]  Procedure(s) Performed: Procedure(s) with comments: Lumbar three-four laminectomy with posteiror lumbar interbody fusion with interbody prosthesis posterior lateral arthrodesis and posteiror nonsegmental instrumentation, Exploration of Fusion (N/A) - Lumbar three-four laminectomy with posteiror lumbar interbody fusion with interbody prosthesis posterior lateral arthrodesis and posteiror nonsegmental instrumentation, Exploration of Fusion  Patient Location: PACU  Anesthesia Type:General  Level of Consciousness: awake, alert  and oriented  Airway & Oxygen Therapy: Patient Spontanous Breathing and Patient connected to nasal cannula oxygen  Post-op Assessment: Report given to PACU RN and Post -op Vital signs reviewed and stable  Post vital signs: Reviewed and stable  Complications: No apparent anesthesia complications

## 2013-12-23 NOTE — Consult Note (Signed)
PHARMACY CONSULT NOTE--POST-PROCEDURE ANTIBIOTICS   Consult  :  Vancomycin Indication :  Empiric Post-Spinal Surgery Antibiotics [Patient has drain in place]  ASSESSMENT:  Pharmacy consulted for empiric Vancomycin s/p spinal surgery.  Patient has a drain in place and will receive Vancomycin until discontinued by Neurosurgery.     Wt 96 kg,  CrCl 50 ml/min.  GOAL:   Vancomycin trough levels 10 - 15 mcg/ml  PLAN:  1. Vancomycin 1500 mg IV q 24 hours. 2. Monitor renal function, WBC, fever curve, any cultures/sensitivities, and clinical progression.  Vancomycin levels as indicated.  Muhamad Serano, Craig Guess,  Pharm.D.,  12/23/2013,  6:24 PM

## 2013-12-23 NOTE — Progress Notes (Signed)
Patient ID: Dawn Salas, female   DOB: [birth year], 73 y.o.   MRN: [id number] Subjective:  The patient is somnolent but easily arousable. She looks well. She is in no apparent distress.  Objective: Vital signs in last 24 hours: Temp:  [97.1 F (36.2 C)-98 F (36.7 C)] 98 F (36.7 C) (12/02 1609) Pulse Rate:  [90-128] 98 (12/02 1630) Resp:  [14-24] 21 (12/02 1630) BP: (149-157)/(72-82) 150/72 mmHg (12/02 1627) SpO2:  [98 %-100 %] 100 % (12/02 1630) Weight:  [95.89 kg (211 lb 6.4 oz)] 95.89 kg (211 lb 6.4 oz) (12/02 1010)  Intake/Output from previous day:   Intake/Output this shift: Total I/O In: 2500 [I.V.:2500] Out: 500 [Urine:300; Blood:200]  Physical exam the patient is somnolent but arousable. She is moving her lower extremities well.  Lab Results: No results for input(s): WBC, HGB, HCT, PLT in the last 72 hours. BMET No results for input(s): NA, K, CL, CO2, GLUCOSE, BUN, CREATININE, CALCIUM in the last 72 hours.  Studies/Results: Dg Lumbar Spine 2-3 Views  12/23/2013   CLINICAL DATA:  L3-L4 posterior lumbar interbody fusion. Intraoperative localization.  EXAM: DG C-ARM 61-120 MIN; LUMBAR SPINE - 2-3 VIEW  COMPARISON:  MRI 11/19/2013.  FINDINGS: Extension of posterior lumbar interbody fusion is present with interval L3-L4 discectomy with pedicle screw fixation. Interbody bone graft noted.  IMPRESSION: L3-L4 discectomy with pedicle screw placement. Pre-existing L4-L5 rod and screw fixation.   Electronically Signed   By: Dereck Ligas M.D.   On: 12/23/2013 15:35   Dg C-arm 1-60 Min  12/23/2013   CLINICAL DATA:  L3-L4 posterior lumbar interbody fusion. Intraoperative localization.  EXAM: DG C-ARM 61-120 MIN; LUMBAR SPINE - 2-3 VIEW  COMPARISON:  MRI 11/19/2013.  FINDINGS: Extension of posterior lumbar interbody fusion is present with interval L3-L4 discectomy with pedicle screw fixation. Interbody bone graft noted.  IMPRESSION: L3-L4 discectomy with pedicle screw placement.  Pre-existing L4-L5 rod and screw fixation.   Electronically Signed   By: Dereck Ligas M.D.   On: 12/23/2013 15:35    Assessment/Plan: The patient is doing well.  LOS: 0 days     Jalasia Eskridge D 12/23/2013, 4:38 PM

## 2013-12-23 NOTE — H&P (Signed)
Subjective: The patient is a 73 year old black female on whom I prefers performed at L4-5 fusion in 2002. The patient has done well for years but has developed recurrent back, buttock, and leg pain consistent with neurogenic claudication. She has failed medical management and was worked up with lumbar x-rays of the lumbar MRI. This demonstrated a spondylolisthesis with spinal stenosis at L3-4. I discussed the various treatment options with the patient including surgery. She has decided proceed with an L3-4 decompression, instrumentation, and fusion.   Past Medical History  Diagnosis Date  . Allergy   . Asthma   . Hyperlipidemia   . Hypertension   . Depression   . GERD (gastroesophageal reflux disease)   . Peptic ulcer disease   . Palpitations   . Multiple meningiomas of spine and brain   . Hyperglycemia   . Obstructive sleep apnea     STUDY AT [facility] DOES NOT USE C PAP  . Hyperthyroidism     PT HAD BIPOSY -NEGATIVE.Marland Kitchen AND NOW JUST GETS CHECKED EACH YEAR .Marland Kitchen 2013 LAST TEST   SEES DR. [name].   Marland Kitchen Arthritis   . Shortness of breath dyspnea     Has SOB with palpitations takes Metoprolol  . Pneumonia     "walking pneumonia" hx of  . Diabetes mellitus     Type two  . Seizures     has seizure after having second brain surgery  . Frequency of urination   . Diverticulitis   . History of hiatal hernia     Past Surgical History  Procedure Laterality Date  . Tonsillectomy    . Tubal ligation    . Right knee arthroscopy      x 3  . Right foot bunectomy    . Right shoulder rotator cuff    . L4-l5 posterior la    . C5-c6 neck fusion    . Left foot plantar and hammertoe    . Back surgery      L SPIN 2003   NECK 2004  . Dilation and curettage of uterus      YEARS AGO  . Craniotomy  08/13/2011    Procedure: CRANIOTOMY TUMOR EXCISION;  Surgeon: [name], MD;  Location: [facility];  Service: Neurosurgery;  Laterality: Bilateral;  Bifrontal craniotomy for tumor  . Abdominal  hysterectomy    . Brain meningioma excision  8/10    X 2  . Wrist surgery Right   . Colonoscopy      Allergies  Allergen Reactions  . Flagyl [Metronidazole] Other (See Comments)    Caused increased heart rate  . Aspirin Effervescent Swelling    Alka-Seltzer gel caps- sore mouth, face swollen, turned hands white  . Morphine Sulfate     REACTION: hives  . Penicillins     REACTION: rash    History  Substance Use Topics  . Smoking status: Never Smoker   . Smokeless tobacco: Never Used  . Alcohol Use: No    Family History  Problem Relation Age of Onset  . Cancer Mother     ovarian  . Heart disease Father 33  . Arthritis Other   . Hyperlipidemia Other   . Hypertension Other   . Diabetes Other   . Anesthesia problems Daughter     NAUSEA AND VOMITING POST OP   Prior to Admission medications   Medication Sig Start Date End Date Taking? Authorizing Provider  ACIPHEX 20 MG tablet One tablet daily.  DAW-no generics 02/05/13  Yes Eulas Post, MD  amLODipine (NORVASC) 5 MG tablet Take 1 tablet (5 mg total) by mouth daily. Patient taking differently: Take 5 mg by mouth daily as needed (Pt states she takes this PRN depending on her Blood pressure).  01/13/13  Yes Eulas Post, MD  Cyanocobalamin (VITAMIN B12 PO) Take 1 mL by mouth daily. OTC   Yes Historical Provider, MD  diazepam (VALIUM) 5 MG tablet Take 2.5 mg by mouth daily as needed. For anxiety 08/17/11 12/09/13 Yes Newman Pies, MD  diclofenac sodium (VOLTAREN) 1 % GEL Apply 1 application topically 2 (two) times daily.   Yes Historical Provider, MD  furosemide (LASIX) 40 MG tablet Take 20 mg by mouth daily as needed. For fluid   Yes Historical Provider, MD  glucose blood (EASYMAX TEST) test strip 2 each by Other route as needed for other. 11/10/12  Yes Eulas Post, MD  Iron-Vitamins (GERITOL COMPLETE PO) Take 1 tablet by mouth daily.   Yes Historical Provider, MD  levothyroxine (SYNTHROID, LEVOTHROID) 100 MCG  tablet Take 100 mcg by mouth daily before breakfast.   Yes Historical Provider, MD  losartan-hydrochlorothiazide (HYZAAR) 100-12.5 MG per tablet TAKE 1 TABLET DAILY 11/23/13  Yes Eulas Post, MD  meclizine (ANTIVERT) 25 MG tablet Take 25 mg by mouth 3 (three) times daily as needed for dizziness.   Yes Historical Provider, MD  metFORMIN (GLUCOPHAGE) 500 MG tablet TAKE 1 TABLET TWICE A DAY WITH A MEAL 07/06/13  Yes Eulas Post, MD  metoprolol succinate (TOPROL-XL) 100 MG 24 hr tablet TAKE ONE (1) TABLET EACH DAY 11/20/13  Yes Eulas Post, MD  potassium chloride (K-DUR) 10 MEQ tablet TAKE ONE (1) TABLET EACH DAY Patient taking differently: TAKE ONE (1) TABLET EACH DAY PRN 04/10/13  Yes Eulas Post, MD  rosuvastatin (CRESTOR) 10 MG tablet Take 10 mg by mouth once a week. Take one tablet once a week   Yes Historical Provider, MD  VESICARE 5 MG tablet TAKE 1 TABLET DAILY 10/05/13  Yes Eulas Post, MD  albuterol (PROVENTIL,VENTOLIN) 90 MCG/ACT inhaler Inhale 2 puffs into the lungs every 6 (six) hours as needed. For shortness of breath    Historical Provider, MD  cetirizine (ZYRTEC) 10 MG tablet Take 10 mg by mouth daily as needed. For allergies    Historical Provider, MD  fluticasone (FLONASE) 50 MCG/ACT nasal spray Place 2 sprays into both nostrils daily as needed. For allergies, stuffy head 03/02/13   Eulas Post, MD  hydrocortisone (ANUSOL-HC) 2.5 % rectal cream Place rectally 2 (two) times daily. Patient not taking: Reported on 12/09/2013 12/10/11   Lucretia Kern, DO  valACYclovir (VALTREX) 1000 MG tablet Take 1,000 mg by mouth 2 (two) times daily as needed. For mouth blisters, and nose blisters    Historical Provider, MD     Review of Systems  Positive ROS: As above  All other systems have been reviewed and were otherwise negative with the exception of those mentioned in the HPI and as above.  Objective: Vital signs in last 24 hours: Temp:  [97.1 F (36.2 C)] 97.1  F (36.2 C) (12/02 1010) Pulse Rate:  [90] 90 (12/02 1010) Resp:  [20] 20 (12/02 1010) BP: (157)/(82) 157/82 mmHg (12/02 1010) SpO2:  [100 %] 100 % (12/02 1010) Weight:  [95.89 kg (211 lb 6.4 oz)] 95.89 kg (211 lb 6.4 oz) (12/02 1010)  General Appearance: Alert, cooperative, no distress, Head: Normocephalic, without obvious abnormality, atraumatic Eyes: PERRL,  conjunctiva/corneas clear, EOM's intact,    Ears: Normal  Throat: Normal  Neck: Supple, symmetrical, trachea midline, no adenopathy; thyroid: No enlargement/tenderness/nodules; no carotid bruit or JVD Back: Symmetric, no curvature, ROM normal, no CVA tenderness. The patient's lumbar incision is well-healed. Lungs: Clear to auscultation bilaterally, respirations unlabored Heart: Regular rate and rhythm, no murmur, rub or gallop Abdomen: Soft, non-tender,, no masses, no organomegaly Extremities: Extremities normal, atraumatic, no cyanosis or edema Pulses: 2+ and symmetric all extremities Skin: Skin color, texture, turgor normal, no rashes or lesions  NEUROLOGIC:   Mental status: alert and oriented, no aphasia, good attention span, Fund of knowledge/ memory ok Motor Exam - grossly normal Sensory Exam - grossly normal Reflexes:  Coordination - grossly normal Gait - grossly normal Balance - grossly normal Cranial Nerves: I: smell Not tested  II: visual acuity  OS: Normal  OD: Normal   II: visual fields Full to confrontation  II: pupils Equal, round, reactive to light  III,VII: ptosis None  III,IV,VI: extraocular muscles  Full ROM  V: mastication Normal  V: facial light touch sensation  Normal  V,VII: corneal reflex  Present  VII: facial muscle function - upper  Normal  VII: facial muscle function - lower Normal  VIII: hearing Not tested  IX: soft palate elevation  Normal  IX,X: gag reflex Present  XI: trapezius strength  5/5  XI: sternocleidomastoid strength 5/5  XI: neck flexion strength  5/5  XII: tongue strength   Normal    Data Review Lab Results  Component Value Date   WBC 3.3* 12/11/2013   HGB 11.7* 12/11/2013   HCT 35.1* 12/11/2013   MCV 79.6 12/11/2013   PLT 173 12/11/2013   Lab Results  Component Value Date   NA 138 12/11/2013   K 3.8 12/11/2013   CL 99 12/11/2013   CO2 24 12/11/2013   BUN 14 12/11/2013   CREATININE 1.24* 12/11/2013   GLUCOSE 97 12/11/2013   Lab Results  Component Value Date   INR 1.01 08/19/2011    Assessment/Plan: L3-4 spondylolisthesis, spinal stenosis, lumbago, lumbar radiculopathy, neurogenic claudication: I have discussed the situation with the patient. I have reviewed her imaging studies with her and pointed out the abnormalities. We have discussed the various treatment options including surgery. I have described the surgical treatment option of an exploration of her fusion, with an L3-4 decompression, instrumentation, and fusion. I have shown her surgical models. We have discussed the risks, benefits, alternatives, and likelihood of achieving goals with surgery. I have answered all the patient's questions. She has decided to proceed with surgery.   Zelie Asbill D 12/23/2013 11:56 AM

## 2013-12-23 NOTE — Op Note (Signed)
Brief history: The patient is a 73 year old black female who I performed an L4-5 decompression and fusion in 2002. She has developed recurrent back, buttock, and leg pain consistent with neurogenic claudication. She has failed medical management and was worked up with a lumbar MRI and lumbar x-rays. This demonstrated an L3-4 spondylolisthesis with severe spinal stenosis. I discussed the various treatment options with the patient including surgery. She has decided to proceed with an L3-4 decompression, instrumentation, and fusion.  Preoperative diagnosis: L3-4 spondylolisthesis, Degenerative disc disease, spinal stenosis compressing both the L3 and the L4 nerve roots; lumbago; lumbar radiculopathy  Postoperative diagnosis: The same   Procedure: Bilateral L3 Laminotomy/foraminotomies to decompress the bilateral L3 and L4 nerve roots(the work required to do this was in addition to the work required to do the posterior lumbar interbody fusion because of the patient's spinal stenosis, facet arthropathy. Etc. requiring a wide decompression of the nerve roots.); L3-4 posterior lumbar interbody fusion with local morselized autograft bone and Kinnex graft extender; insertion of interbody prosthesis at L3-4 (globus peek interbody prosthesis); posterior segmental instrumentation from L3 to L5 with Medtronic titanium pedicle screws and rods; posterior lateral arthrodesis at L3-4 with local morselized autograft bone and Kinnex bone graft extender.  Surgeon: Dr. Earle Gell  Asst.: Dr. Dayton Bailiff  Anesthesia: Gen. endotracheal  Estimated blood loss: 200 mL  Drains: One medium Hemovac  Complications: None  Description of procedure: The patient was brought to the operating room by the anesthesia team. General endotracheal anesthesia was induced. The patient was turned to the prone position on the Wilson frame. The patient's lumbosacral region was then prepared with Betadine scrub and Betadine solution. Sterile  drapes were applied.  I then injected the area to be incised with Marcaine with epinephrine solution. I then used the scalpel to make a linear midline incision over the L3-4 and L4-5 interspace. I then used electrocautery to perform a bilateral subperiosteal dissection exposing the spinous process and lamina of L2-L5 and exposed the old hardware.  We then inserted the Verstrac retractor to provide exposure. I explored the prior arthrodesis by removing the caps from the old M 10 pedicle screws at L4 and L5. We removed the rods. The arthrodesis appears solid at L4-5.  I began the decompression by using the high speed drill to perform laminotomies at L3 bilaterally. We then used the Kerrison punches to widen the laminotomy and removed the ligamentum flavum at L3-4. We used the Kerrison punches to remove the medial facets at L3-4. We performed wide foraminotomies about the bilateral L3 and L4 nerve roots completing the decompression.  We now turned our attention to the posterior lumbar interbody fusion. I used a scalpel to incise the intervertebral disc at L3-4. I then performed a partial intervertebral discectomy at L3-4 using the pituitary forceps. We prepared the vertebral endplates at V4-3 for the fusion by removing the soft tissues with the curettes. We then used the trial spacers to pick the appropriate sized interbody prosthesis. We prefilled his prosthesis with a combination of local morselized autograft bone that we obtained during the decompression as well as Kinnex bone graft extender. We inserted the prefilled prosthesis into the interspace at L3-4. There was a good snug fit of the prosthesis in the interspace. We then filled and the remainder of the intervertebral disc space with local morselized autograft bone and Kinnex. This completed the posterior lumbar interbody arthrodesis.  We now turned attention to the instrumentation. Under fluoroscopic guidance we cannulated the bilateral L3  pedicles  with the bone probe. We then removed the bone probe. We then tapped the pedicle with a 5.5 millimeter tap. We then removed the tap. We probed inside the tapped pedicle with a ball probe to rule out cortical breaches. We then inserted a 6.5 x 50 millimeter pedicle screw into the L3 pedicles bilaterally under fluoroscopic guidance. We then palpated along the medial aspect of the pedicles to rule out cortical breaches. There were none. The nerve roots were not injured. We then connected the unilateral pedicle screws with a lordotic rod. We compressed the construct and secured the rod in place with the caps. We then tightened the caps appropriately. This completed the instrumentation from L3-L5.  We now turned our attention to the posterior lateral arthrodesis at L3-4. We used the high-speed drill to decorticate the remainder of the facets, pars, transverse process at L3-4. We then applied a combination of local morselized autograft bone and Kinnex bone graft extender over these decorticated posterior lateral structures. This completed the posterior lateral arthrodesis.  We then obtained hemostasis using bipolar electrocautery. We irrigated the wound out with bacitracin solution. We inspected the thecal sac and nerve roots and noted they were well decompressed. We then removed the retractor. We placed a medium Hemovac drain in the epidural space and tunneled out through separate stab wound. We reapproximated patient's thoracolumbar fascia with interrupted #1 Vicryl suture. We reapproximated patient's subcutaneous tissue with interrupted 2-0 Vicryl suture. The reapproximated patient's skin with Steri-Strips and benzoin. The wound was then coated with bacitracin ointment. A sterile dressing was applied. The drapes were removed. The patient was subsequently returned to the supine position where they were extubated by the anesthesia team. He was then transported to the post anesthesia care unit in stable condition. All  sponge instrument and needle counts were reportedly correct at the end of this case.

## 2013-12-23 NOTE — Anesthesia Postprocedure Evaluation (Signed)
Anesthesia Post Note  Patient: Dawn Salas  Procedure(s) Performed: Procedure(s) (LRB): Lumbar three-four laminectomy with posteiror lumbar interbody fusion with interbody prosthesis posterior lateral arthrodesis and posteiror nonsegmental instrumentation, Exploration of Fusion (N/A)  Anesthesia type: general  Patient location: PACU  Post pain: Pain level controlled  Post assessment: Patient's Cardiovascular Status Stable  Last Vitals:  Filed Vitals:   12/23/13 1652  BP:   Pulse: 94  Temp:   Resp: 11    Post vital signs: Reviewed and stable  Level of consciousness: sedated  Complications: No apparent anesthesia complications

## 2013-12-23 NOTE — Anesthesia Preprocedure Evaluation (Addendum)
Anesthesia Evaluation  Patient identified by MRN, date of birth, ID band Patient awake    Reviewed: Allergy & Precautions, H&P , NPO status , Patient's Chart, lab work & pertinent test results  Airway Mallampati: II  TM Distance: >3 FB Neck ROM: Full    Dental  (+) Partial Upper, Lower Dentures, Dental Advisory Given   Pulmonary shortness of breath, asthma , sleep apnea ,    Pulmonary exam normal       Cardiovascular hypertension, Pt. on home beta blockers and Pt. on medications     Neuro/Psych PSYCHIATRIC DISORDERS Depression    GI/Hepatic Neg liver ROS, hiatal hernia, PUD, GERD-  ,  Endo/Other  diabetesHyperthyroidism   Renal/GU Renal InsufficiencyRenal disease     Musculoskeletal   Abdominal   Peds  Hematology   Anesthesia Other Findings   Reproductive/Obstetrics                            Anesthesia Physical Anesthesia Plan  ASA: III  Anesthesia Plan: General   Post-op Pain Management:    Induction: Intravenous  Airway Management Planned: Oral ETT  Additional Equipment:   Intra-op Plan:   Post-operative Plan: Extubation in OR  Informed Consent: I have reviewed the patients History and Physical, chart, labs and discussed the procedure including the risks, benefits and alternatives for the proposed anesthesia with the patient or authorized representative who has indicated his/her understanding and acceptance.   Dental advisory given  Plan Discussed with: CRNA, Anesthesiologist and Surgeon  Anesthesia Plan Comments:        Anesthesia Quick Evaluation

## 2013-12-24 LAB — CBC
HCT: 32 % — ABNORMAL LOW (ref 36.0–46.0)
Hemoglobin: 10.5 g/dL — ABNORMAL LOW (ref 12.0–15.0)
MCH: 26.1 pg (ref 26.0–34.0)
MCHC: 32.8 g/dL (ref 30.0–36.0)
MCV: 79.6 fL (ref 78.0–100.0)
Platelets: 171 10*3/uL (ref 150–400)
RBC: 4.02 MIL/uL (ref 3.87–5.11)
RDW: 13.1 % (ref 11.5–15.5)
WBC: 6 10*3/uL (ref 4.0–10.5)

## 2013-12-24 LAB — GLUCOSE, CAPILLARY
Glucose-Capillary: 117 mg/dL — ABNORMAL HIGH (ref 70–99)
Glucose-Capillary: 125 mg/dL — ABNORMAL HIGH (ref 70–99)
Glucose-Capillary: 129 mg/dL — ABNORMAL HIGH (ref 70–99)
Glucose-Capillary: 135 mg/dL — ABNORMAL HIGH (ref 70–99)

## 2013-12-24 LAB — BASIC METABOLIC PANEL
Anion gap: 13 (ref 5–15)
BUN: 17 mg/dL (ref 6–23)
CO2: 26 mEq/L (ref 19–32)
Calcium: 8.8 mg/dL (ref 8.4–10.5)
Chloride: 99 mEq/L (ref 96–112)
Creatinine, Ser: 1.07 mg/dL (ref 0.50–1.10)
GFR calc Af Amer: 59 mL/min — ABNORMAL LOW (ref >90)
GFR calc non Af Amer: 51 mL/min — ABNORMAL LOW (ref >90)
Glucose, Bld: 135 mg/dL — ABNORMAL HIGH (ref 70–99)
Potassium: 4.2 mEq/L (ref 3.7–5.3)
Sodium: 138 mEq/L (ref 137–147)

## 2013-12-24 NOTE — Progress Notes (Signed)
Patient ID: Dawn Salas, female   DOB: 11-20-1940, 73 y.o.   MRN: [id number] Subjective:  The patient is alert and pleasant. She looks well. She has no complaints.  Objective: Vital signs in last 24 hours: Temp:  [97 F (36.1 C)-98.2 F (36.8 C)] 98.2 F (36.8 C) (12/03 0828) Pulse Rate:  [93-128] 93 (12/03 0828) Resp:  [10-24] 20 (12/03 0353) BP: (116-171)/(59-97) 116/59 mmHg (12/03 0828) SpO2:  [98 %-100 %] 100 % (12/03 0828)  Intake/Output from previous day: 12/02 0701 - 12/03 0700 In: 2500 [I.V.:2500] Out: 1870 [Urine:1500; Drains:170; Blood:200] Intake/Output this shift: Total I/O In: 360 [P.O.:360] Out: -   Physical exam patient is alert and oriented 3. Her strength is grossly normal in her lower extremities.  Lab Results:  Recent Labs  12/24/13 0210  WBC 6.0  HGB 10.5*  HCT 32.0*  PLT 171   BMET  Recent Labs  12/24/13 0210  NA 138  K 4.2  CL 99  CO2 26  GLUCOSE 135*  BUN 17  CREATININE 1.07  CALCIUM 8.8    Studies/Results: Dg Lumbar Spine 2-3 Views  12/23/2013   CLINICAL DATA:  L3-L4 posterior lumbar interbody fusion. Intraoperative localization.  EXAM: DG C-ARM 61-120 MIN; LUMBAR SPINE - 2-3 VIEW  COMPARISON:  MRI 11/19/2013.  FINDINGS: Extension of posterior lumbar interbody fusion is present with interval L3-L4 discectomy with pedicle screw fixation. Interbody bone graft noted.  IMPRESSION: L3-L4 discectomy with pedicle screw placement. Pre-existing L4-L5 rod and screw fixation.   Electronically Signed   By: Dereck Ligas M.D.   On: 12/23/2013 15:35   Dg C-arm 1-60 Min  12/23/2013   CLINICAL DATA:  L3-L4 posterior lumbar interbody fusion. Intraoperative localization.  EXAM: DG C-ARM 61-120 MIN; LUMBAR SPINE - 2-3 VIEW  COMPARISON:  MRI 11/19/2013.  FINDINGS: Extension of posterior lumbar interbody fusion is present with interval L3-L4 discectomy with pedicle screw fixation. Interbody bone graft noted.  IMPRESSION: L3-L4 discectomy with pedicle  screw placement. Pre-existing L4-L5 rod and screw fixation.   Electronically Signed   By: Dereck Ligas M.D.   On: 12/23/2013 15:35    Assessment/Plan: Postop day #1: The patient is doing well. She may go home tomorrow.  LOS: 1 day     Dawn Salas D 12/24/2013, 3:12 PM

## 2013-12-24 NOTE — Consult Note (Signed)
PHARMACY CONSULT NOTE--POST-PROCEDURE ANTIBIOTICS   Consult  :  Vancomycin Indication :  Empiric Post-Spinal Surgery Antibiotics [Patient has drain in place]  ASSESSMENT:  Pharmacy consulted for empiric Vancomycin s/p spinal surgery.  Patient has a drain in place and will receive Vancomycin until discontinued by Neurosurgery.     Wt 96 kg,  CrCl 50 ml/min.  GOAL:   Vancomycin trough levels 10 - 15 mcg/ml  PLAN:  1. Vancomycin 1500 mg IV q 24 hours. 2. Monitor renal function, WBC, fever curve, any cultures/sensitivities, and clinical progression.  Vancomycin levels as indicated.  Stramoski, Craig Guess,  Pharm.D.,  12/24/2013,  7:06 PM   Addendum  Drain has been dced tonight. Dc abx  Onnie Boer, PharmD Pager: [phone] 12/24/2013 7:07 PM

## 2013-12-24 NOTE — Evaluation (Signed)
Physical Therapy Evaluation Patient Details Name: Dawn Salas MRN: [id number] DOB: 1940-06-23 Today's Date: 12/24/2013   History of Present Illness    Pt admit for Lumbar three-four laminectomy with posteiror lumbar interbody fusion with interbody prosthesis posterior lateral arthrodesis and posteiror nonsegmental instrumentation, Exploration of Fusion.  Clinical Impression  Pt admitted with above. Pt currently with functional limitations due to the deficits listed below (see PT Problem List).  Will follow pt to practice log roll if pt stays in hospital as well as to reiterate precautions.  Pt will benefit from skilled PT to increase their independence and safety with mobility to allow discharge to the venue listed below.     Follow Up Recommendations No PT follow up (at this time.  Outpt PT once back heals)    Equipment Recommendations  Other (comment) (sock aid)    Recommendations for Other Services       Precautions / Restrictions Precautions Precautions: Fall;Back Precaution Booklet Issued: Yes (comment) Required Braces or Orthoses: Spinal Brace Spinal Brace: Applied in sitting position Restrictions Weight Bearing Restrictions: No      Mobility  Bed Mobility Overal bed mobility: Needs Assistance Bed Mobility: Supine to Sit     Supine to sit: Min assist     General bed mobility comments: Pt needs reenforcement with back precautions with bed mobility for log roll.  NT to practice with pt today as well.    Transfers Overall transfer level: Needs assistance Equipment used: Straight cane Transfers: Sit to/from Stand Sit to Stand: Min guard         General transfer comment: Needs incr time to stand up from bed/surfaces.  Ambulation/Gait Ambulation/Gait assistance: Min guard Ambulation Distance (Feet): 500 Feet Assistive device: None Gait Pattern/deviations: Step-through pattern;Decreased stride length;Decreased step length - left;Decreased step length -  right;Antalgic;Trunk flexed   Gait velocity interpretation: Below normal speed for age/gender General Gait Details: Pt with slightly flexed posture and states this is premorbid.  Pt overall with good safety with ambulation.  Takes her time.    Stairs Stairs: Yes Stairs assistance: Supervision Stair Management: One rail Left;Step to pattern;Forwards;With cane Number of Stairs: 13 General stair comments: Pt able to perform two steps with cane only.  Pt able to perform 13 steps with rail and cane.  no cues needed.    Wheelchair Mobility    Modified Rankin (Stroke Patients Only)       Balance Overall balance assessment: Needs assistance         Standing balance support: No upper extremity supported;During functional activity Standing balance-Leahy Scale: Fair Standing balance comment: Good static standing balance with pt standing to wash self up at sink for up to 10 minutes without support.                               Pertinent Vitals/Pain Pain Assessment: 0-10 Pain Score: 5  Pain Location: back Pain Descriptors / Indicators: Sore Pain Intervention(s): Limited activity within patient's tolerance;Monitored during session;Repositioned;Premedicated before session  VSS    Home Living Family/patient expects to be discharged to:: Private residence Living Arrangements: Spouse/significant other;Children Available Help at Discharge: Family;Available 24 hours/day (husband retired) Type of Home: House Home Access: Stairs to enter Entrance Stairs-Rails: None Secretary/administrator of Steps: 2 Home Layout: Two level;1/2 bath on main level;Bed/bath upstairs Home Equipment: Walker - 4 wheels;Cane - single point;Shower seat - built in;Adaptive equipment (rollaway bed for downstairs prn, handicapped height toilet)  Prior Function Level of Independence: Independent with assistive device(s)         Comments: used cane at times PTA     Hand Dominance   Dominant  Hand: Right    Extremity/Trunk Assessment   Upper Extremity Assessment: Defer to OT evaluation           Lower Extremity Assessment: Generalized weakness      Cervical / Trunk Assessment: Kyphotic  Communication   Communication: No difficulties  Cognition Arousal/Alertness: Awake/alert Behavior During Therapy: WFL for tasks assessed/performed Overall Cognitive Status: Within Functional Limits for tasks assessed                      General Comments General comments (skin integrity, edema, etc.): Upon entry to room, pt was sitting on EOB eating breakfast without the brace on.  Educated pt that she should have the brace on when sitting up.  Pt able to don brace independently.  Pt then educated on back precaution handout.  Pt states she had difficulty with log roll and PT and nursing to continue to practice this with pt.  Pt educated on sitting as well as precautions with kitchen and other household duties.  Pt admits she reached inappropriately this am to reaching down to pull her socks up.  Discussed that pt get a sock aid and pt in agreement and to have her daughter get the sock aid from the gift shop prior to d/c to home.  Has all other equipment.  Pt very appreciative for education as she states she has had 2 back surgeries now and prefers this be her last.      Exercises Other Exercises Other Exercises: Discussed that pt go to Outpt PT when back is healed to strengthen back and that MD can refer her.        Assessment/Plan    PT Assessment Patient needs continued PT services  PT Diagnosis Generalized weakness;Difficulty walking;Acute pain   PT Problem List Decreased strength;Decreased activity tolerance;Decreased mobility;Decreased balance;Decreased knowledge of use of DME;Decreased safety awareness;Decreased knowledge of precautions;Pain  PT Treatment Interventions DME instruction;Gait training;Stair training;Functional mobility training;Therapeutic  exercise;Therapeutic activities;Balance training;Patient/family education   PT Goals (Current goals can be found in the Care Plan section) Acute Rehab PT Goals Patient Stated Goal: to go home PT Goal Formulation: With patient Time For Goal Achievement: 12/31/13 Potential to Achieve Goals: Good    Frequency Min 5X/week   Barriers to discharge        Co-evaluation               End of Session Equipment Utilized During Treatment: Gait belt;Back brace Activity Tolerance: Patient tolerated treatment well Patient left: with call bell/phone within reach (in bathroom washing up with the call bell in reach) Nurse Communication: Mobility status;Precautions (NT and nurse aware to practice log roll)         Time: 4098-1191 PT Time Calculation (min) (ACUTE ONLY): 40 min   Charges:   PT Evaluation $Initial PT Evaluation Tier I: 1 Procedure PT Treatments $Gait Training: 8-22 mins $Self Care/Home Management: 8-22   PT G CodesTawni Millers F 2014-01-13, 10:02 AM Shakedra Beam,PT Acute Rehabilitation [phone] (pager)

## 2013-12-24 NOTE — Progress Notes (Signed)
Utilization review completed.  

## 2013-12-24 NOTE — Plan of Care (Signed)
Problem: Phase II Progression Outcomes Goal: Progress activity as tolerated unless otherwise ordered Outcome: Progressing Goal: Discharge plan established Outcome: Progressing Goal: Tolerating diet Outcome: Progressing Goal: Verbalizes of donning/doffing brace Outcome: Progressing Goal: Understands assist devices with ambulation Outcome: Progressing Goal: PT/OT consults completed Outcome: Progressing

## 2013-12-25 LAB — GLUCOSE, CAPILLARY: Glucose-Capillary: 105 mg/dL — ABNORMAL HIGH (ref 70–99)

## 2013-12-25 MED ORDER — DSS 100 MG PO CAPS: 100.0000 mg | ORAL_CAPSULE | Freq: Two times a day (BID) | ORAL | Status: DC

## 2013-12-25 MED ORDER — DIAZEPAM 5 MG PO TABS: 5.0000 mg | ORAL_TABLET | Freq: Four times a day (QID) | ORAL | Status: DC | PRN

## 2013-12-25 MED ORDER — OXYCODONE-ACETAMINOPHEN 10-325 MG PO TABS: 1.0000 | ORAL_TABLET | ORAL | Status: DC | PRN

## 2013-12-25 NOTE — Progress Notes (Signed)
PT Cancellation Note  Patient Details Name: Dawn Salas MRN: [id number] DOB: 22-Mar-1940   Cancelled Treatment:    Reason Eval/Treat Not Completed: Other (comment) (Pt declined.  Ready to go home. Feels confident about mobility.)   [name] 12/25/2013, 9:31 AM  [facility] Acute Rehabilitation [phone] (pager)

## 2013-12-25 NOTE — Progress Notes (Signed)
Patient alert and oriented, mae's well, voiding adequate amount of urine, swallowing without difficulty, no c/o pain. Patient discharged home with family. Script and discharged instructions given to patient. Patient and family stated understanding of d/c instructions given and has an appointment with MD. Aisha Arijana Narayan RN 

## 2013-12-28 MED FILL — Sodium Chloride IV Soln 0.9%: INTRAVENOUS | Qty: 1000 | Status: AC

## 2013-12-28 MED FILL — Heparin Sodium (Porcine) Inj 1000 Unit/ML: INTRAMUSCULAR | Qty: 30 | Status: AC

## 2013-12-28 NOTE — Discharge Summary (Signed)
Physician Discharge Summary  Patient ID: ADINA PUZZO MRN: [id number] DOB/AGE: 71/13/1942 71 y.o.  Admit date: 12/23/2013 Discharge date: 12/25/2013  Admission Diagnoses: L3-4 spondylolisthesis, spinal stenosis, lumbago, lumbar radiculopathy, neurogenic claudication  Discharge Diagnoses: The same Active Problems:   Spondylolisthesis of lumbar region   Discharged Condition: good  Hospital Course: I performed an L3-4 decompression, instrumentation, and fusion on the patient on 12-15. The surgery went well.  The patient's postoperative course was unremarkable. On postoperative day #2 she requested discharge to home. She was given oral and written discharge instructions. All her questions were answered.  Consults: PT Significant Diagnostic Studies: None Treatments: L3-4 decompression, instrumentation, and fusion. Discharge Exam: Blood pressure 137/78, pulse 99, temperature 98.5 F (36.9 C), temperature source Oral, resp. rate 18, height 5\' 7"  (1.702 m), weight 95.89 kg (211 lb 6.4 oz), SpO2 100 %. The patient is alert and pleasant. She looks well. Her dressing is clean and dry. Her strength is normal in her lower extremities.  Disposition: Home  Discharge Instructions    Call MD for:  difficulty breathing, headache or visual disturbances    Complete by:  As directed      Call MD for:  extreme fatigue    Complete by:  As directed      Call MD for:  hives    Complete by:  As directed      Call MD for:  persistant dizziness or light-headedness    Complete by:  As directed      Call MD for:  persistant nausea and vomiting    Complete by:  As directed      Call MD for:  redness, tenderness, or signs of infection (pain, swelling, redness, odor or green/yellow discharge around incision site)    Complete by:  As directed      Call MD for:  severe uncontrolled pain    Complete by:  As directed      Call MD for:  temperature >100.4    Complete by:  As directed      Diet - low  sodium heart healthy    Complete by:  As directed      Discharge instructions    Complete by:  As directed   Call [phone] for a followup appointment. Take a stool softener while you are using pain medications.     Driving Restrictions    Complete by:  As directed   Do not drive for 2 weeks.     Increase activity slowly    Complete by:  As directed      Lifting restrictions    Complete by:  As directed   Do not lift more than 5 pounds. No excessive bending or twisting.     May shower / Bathe    Complete by:  As directed   He may shower after the pain she is removed 3 days after surgery. Leave the incision alone.     Remove dressing in 48 hours    Complete by:  As directed   Your stitches are under the scan and will dissolve by themselves. The Steri-Strips will fall off after you take a few showers. Do not rub back or pick at the wound, Leave the wound alone.            Medication List    STOP taking these medications        diclofenac sodium 1 % Gel  Commonly known as:  VOLTAREN      TAKE  these medications        ACIPHEX 20 MG tablet  Generic drug:  RABEprazole  One tablet daily.  DAW-no generics     albuterol 90 MCG/ACT inhaler  Commonly known as:  PROVENTIL,VENTOLIN  Inhale 2 puffs into the lungs every 6 (six) hours as needed. For shortness of breath     amLODipine 5 MG tablet  Commonly known as:  NORVASC  Take 1 tablet (5 mg total) by mouth daily.     cetirizine 10 MG tablet  Commonly known as:  ZYRTEC  Take 10 mg by mouth daily as needed. For allergies     diazepam 5 MG tablet  Commonly known as:  VALIUM  Take 1 tablet (5 mg total) by mouth every 6 (six) hours as needed for muscle spasms.     DSS 100 MG Caps  Take 100 mg by mouth 2 (two) times daily.     fluticasone 50 MCG/ACT nasal spray  Commonly known as:  FLONASE  Place 2 sprays into both nostrils daily as needed. For allergies, stuffy head     furosemide 40 MG tablet  Commonly known as:  LASIX   Take 20 mg by mouth daily as needed. For fluid     GERITOL COMPLETE PO  Take 1 tablet by mouth daily.     glucose blood test strip  Commonly known as:  EASYMAX TEST  2 each by Other route as needed for other.     hydrocortisone 2.5 % rectal cream  Commonly known as:  ANUSOL-HC  Place rectally 2 (two) times daily.     levothyroxine 100 MCG tablet  Commonly known as:  SYNTHROID, LEVOTHROID  Take 100 mcg by mouth daily before breakfast.     losartan-hydrochlorothiazide 100-12.5 MG per tablet  Commonly known as:  HYZAAR  TAKE 1 TABLET DAILY     meclizine 25 MG tablet  Commonly known as:  ANTIVERT  Take 25 mg by mouth 3 (three) times daily as needed for dizziness.     metFORMIN 500 MG tablet  Commonly known as:  GLUCOPHAGE  TAKE 1 TABLET TWICE A DAY WITH A MEAL     metoprolol succinate 100 MG 24 hr tablet  Commonly known as:  TOPROL-XL  TAKE ONE (1) TABLET EACH DAY     oxyCODONE-acetaminophen 10-325 MG per tablet  Commonly known as:  PERCOCET  Take 1 tablet by mouth every 4 (four) hours as needed for pain.     potassium chloride 10 MEQ tablet  Commonly known as:  K-DUR  TAKE ONE (1) TABLET EACH DAY     rosuvastatin 10 MG tablet  Commonly known as:  CRESTOR  Take 10 mg by mouth once a week. Take one tablet once a week     valACYclovir 1000 MG tablet  Commonly known as:  VALTREX  Take 1,000 mg by mouth 2 (two) times daily as needed. For mouth blisters, and nose blisters     VESICARE 5 MG tablet  Generic drug:  solifenacin  TAKE 1 TABLET DAILY     VITAMIN B12 PO  Take 1 mL by mouth daily. OTC           Follow-up Information    Follow up with [name], MD.   Specialty:  Neurosurgery   Contact information:   [address] [place] [address] [phone]       Signed: [name] 12/28/2013, 10:39 AM

## 2013-12-29 ENCOUNTER — Ambulatory Visit: Payer: Medicare Other | Admitting: Family Medicine

## 2014-01-01 ENCOUNTER — Telehealth: Payer: Self-pay

## 2014-01-01 NOTE — Telephone Encounter (Signed)
error 

## 2014-01-20 ENCOUNTER — Encounter: Payer: Self-pay | Admitting: Family Medicine

## 2014-01-20 ENCOUNTER — Ambulatory Visit (INDEPENDENT_AMBULATORY_CARE_PROVIDER_SITE_OTHER): Payer: Medicare Other | Admitting: Family Medicine

## 2014-01-20 VITALS — BP 110/80 | HR 86 | Temp 98.0°F | Wt 204.0 lb

## 2014-01-20 DIAGNOSIS — I10 Essential (primary) hypertension: Secondary | ICD-10-CM

## 2014-01-20 DIAGNOSIS — E119 Type 2 diabetes mellitus without complications: Secondary | ICD-10-CM

## 2014-01-20 DIAGNOSIS — E0590 Thyrotoxicosis, unspecified without thyrotoxic crisis or storm: Secondary | ICD-10-CM

## 2014-01-20 DIAGNOSIS — E785 Hyperlipidemia, unspecified: Secondary | ICD-10-CM

## 2014-01-20 LAB — LIPID PANEL
Cholesterol: 216 mg/dL — ABNORMAL HIGH (ref 0–200)
HDL: 70.9 mg/dL (ref >39.00)
LDL Cholesterol: 127 mg/dL — ABNORMAL HIGH (ref 0–99)
NonHDL: 145.1
Total CHOL/HDL Ratio: 3
Triglycerides: 91 mg/dL (ref 0.0–149.0)
VLDL: 18.2 mg/dL (ref 0.0–40.0)

## 2014-01-20 LAB — HEPATIC FUNCTION PANEL
ALT: 9 U/L (ref 0–35)
AST: 19 U/L (ref 0–37)
Albumin: 3.7 g/dL (ref 3.5–5.2)
Alkaline Phosphatase: 105 U/L (ref 39–117)
Bilirubin, Direct: 0.1 mg/dL (ref 0.0–0.3)
Total Bilirubin: 0.4 mg/dL (ref 0.2–1.2)
Total Protein: 7.1 g/dL (ref 6.0–8.3)

## 2014-01-20 LAB — HM DIABETES FOOT EXAM: HM Diabetic Foot Exam: NORMAL

## 2014-01-20 LAB — HEMOGLOBIN A1C: Hgb A1c MFr Bld: 7 % — ABNORMAL HIGH (ref 4.6–6.5)

## 2014-01-20 MED ORDER — GLUCOSE BLOOD VI STRP: 1.0000 | ORAL_STRIP | Freq: Two times a day (BID) | Status: DC

## 2014-01-20 MED ORDER — ACCU-CHEK SOFTCLIX LANCET DEV MISC: 1.0000 | Freq: Two times a day (BID) | Status: DC

## 2014-01-20 NOTE — Progress Notes (Signed)
Pre visit review using our clinic review tool, if applicable. No additional management support is needed unless otherwise documented below in the visit note. Lab Results  Component Value Date   HGBA1C 6.4 06/30/2013   HGBA1C 6.9* 12/30/2012   HGBA1C 7.0* 09/04/2012   Lab Results  Component Value Date   LDLCALC 222* 07/23/2013   CREATININE 1.07 12/24/2013

## 2014-01-20 NOTE — Addendum Note (Signed)
Addended by: Westley Hummer B on: 01/20/2014 09:08 AM   Modules accepted: Orders

## 2014-01-20 NOTE — Patient Instructions (Signed)
Try to reduce Amlodipine to one half tablet daily and monitor blood pressure and make sure < 130/80

## 2014-01-20 NOTE — Progress Notes (Signed)
Subjective:    Patient ID: Dawn Salas, female    DOB: [birth year], 73 y.o.   MRN: [id number]  HPI Patient here for medical follow-up. She has history of obesity, hypothyroidism, type 2 diabetes, severe hyperlipidemia, history of benign cerebral hemangiomas, history of partial seizures, type 2 diabetes. She is been seen by endocrinologist over in [facility] exactly monitor her thyroid. She is requesting labs today to reassess her blood sugar. Last A1c 6.4%. She takes low-dose metformin. Fasting blood sugars mostly low 100s. No symptoms of polyuria or polydipsia.  She had recent lumbar fusion L3-4 and 5 is done extremely well since then. Blood pressure treated with losartan HCTZ, metoprolol, and amlodipine. Occasional headaches which she thinks may be secondary to amlodipine. No orthostatic symptoms.  She has severe hyperlipidemia history and remains on Crestor. No myalgias. Compliant with therapy. No cardiac history  Past Medical History  Diagnosis Date  . Allergy   . Asthma   . Hyperlipidemia   . Hypertension   . Depression   . GERD (gastroesophageal reflux disease)   . Peptic ulcer disease   . Palpitations   . Multiple meningiomas of spine and brain   . Hyperglycemia   . Obstructive sleep apnea     STUDY AT [facility] DOES NOT USE C PAP  . Hyperthyroidism     PT HAD BIPOSY -NEGATIVE.Marland Kitchen AND NOW JUST GETS CHECKED EACH YEAR .Marland Kitchen 2013 LAST TEST   SEES DR. [name].   Marland Kitchen Arthritis   . Shortness of breath dyspnea     Has SOB with palpitations takes Metoprolol  . Pneumonia     "walking pneumonia" hx of  . Diabetes mellitus     Type two  . Seizures     has seizure after having second brain surgery  . Frequency of urination   . Diverticulitis   . History of hiatal hernia    Past Surgical History  Procedure Laterality Date  . Tonsillectomy    . Tubal ligation    . Right knee arthroscopy      x 3  . Right foot bunectomy    . Right shoulder rotator cuff    . L4-l5 posterior la    .  C5-c6 neck fusion    . Left foot plantar and hammertoe    . Back surgery      L SPIN 2003   NECK 2004  . Dilation and curettage of uterus      YEARS AGO  . Craniotomy  08/13/2011    Procedure: CRANIOTOMY TUMOR EXCISION;  Surgeon: [name], MD;  Location: [facility];  Service: Neurosurgery;  Laterality: Bilateral;  Bifrontal craniotomy for tumor  . Abdominal hysterectomy    . Brain meningioma excision  8/10    X 2  . Wrist surgery Right   . Colonoscopy      reports that she has never smoked. She has never used smokeless tobacco. She reports that she does not drink alcohol or use illicit drugs. family history includes Anesthesia problems in her daughter; Arthritis in her other; Cancer in her mother; Diabetes in her other; Heart disease (age of onset: 61) in her father; Hyperlipidemia in her other; Hypertension in her other. Allergies  Allergen Reactions  . Flagyl [Metronidazole] Other (See Comments)    Caused increased heart rate  . Aspirin Effervescent Swelling    Alka-Seltzer gel caps- sore mouth, face swollen, turned hands white  . Morphine Sulfate     REACTION: hives  .  Penicillins     REACTION: rash      Review of Systems  Constitutional: Negative for fatigue.  Eyes: Negative for visual disturbance.  Respiratory: Negative for cough, chest tightness, shortness of breath and wheezing.   Cardiovascular: Negative for chest pain, palpitations and leg swelling.  Endocrine: Negative for polydipsia and polyuria.  Genitourinary: Negative for dysuria.  Neurological: Negative for dizziness, seizures, syncope, weakness, light-headedness and headaches.       Objective:   Physical Exam  Constitutional: She appears well-developed and well-nourished.  Neck: Neck supple. No thyromegaly present.  Cardiovascular: Normal rate and regular rhythm.  Exam reveals no gallop.   Pulmonary/Chest: Effort normal and breath sounds normal. No respiratory distress. She has no wheezes. She  has no rales.  Musculoskeletal: She exhibits no edema.  Skin:  Feet reveal no skin lesions. Good distal foot pulses. Good capillary refill. No calluses. Normal sensation with monofilament testing           Assessment & Plan:  #1 type 2 diabetes. History of good control. Recheck A1c #2 history of severe hyperlipidemia. Currently on Crestor. Reassess lipid and hepatic panel  #3 hypertension well controlled. She'll try reducing her amlodipine to 2.5 mg once daily to see if her headaches improve. Monitor blood pressure and be in touch if consistently over 130/80

## 2014-01-21 ENCOUNTER — Telehealth: Payer: Self-pay | Admitting: Family Medicine

## 2014-01-21 NOTE — Telephone Encounter (Signed)
emmi emailed °

## 2014-01-28 ENCOUNTER — Other Ambulatory Visit: Payer: Self-pay | Admitting: Family Medicine

## 2014-01-28 DIAGNOSIS — Z1231 Encounter for screening mammogram for malignant neoplasm of breast: Secondary | ICD-10-CM

## 2014-02-09 ENCOUNTER — Ambulatory Visit (HOSPITAL_COMMUNITY)
Admission: RE | Admit: 2014-02-09 | Discharge: 2014-02-09 | Disposition: A | Discharge status: Home / Self Care | Payer: Medicare Other | Source: Ambulatory Visit | Attending: Family Medicine | Admitting: Family Medicine

## 2014-02-09 DIAGNOSIS — Z1231 Encounter for screening mammogram for malignant neoplasm of breast: Secondary | ICD-10-CM | POA: Insufficient documentation

## 2014-02-11 ENCOUNTER — Other Ambulatory Visit: Payer: Self-pay | Admitting: Family Medicine

## 2014-02-20 ENCOUNTER — Other Ambulatory Visit: Payer: Self-pay | Admitting: Family Medicine

## 2014-04-21 ENCOUNTER — Ambulatory Visit (INDEPENDENT_AMBULATORY_CARE_PROVIDER_SITE_OTHER): Payer: Medicare Other | Admitting: Family Medicine

## 2014-04-21 ENCOUNTER — Encounter: Payer: Self-pay | Admitting: Family Medicine

## 2014-04-21 VITALS — BP 130/80 | HR 68 | Temp 98.2°F | Wt 204.0 lb

## 2014-04-21 DIAGNOSIS — E119 Type 2 diabetes mellitus without complications: Secondary | ICD-10-CM

## 2014-04-21 DIAGNOSIS — R300 Dysuria: Secondary | ICD-10-CM | POA: Diagnosis not present

## 2014-04-21 DIAGNOSIS — I10 Essential (primary) hypertension: Secondary | ICD-10-CM

## 2014-04-21 DIAGNOSIS — N3001 Acute cystitis with hematuria: Secondary | ICD-10-CM | POA: Diagnosis not present

## 2014-04-21 DIAGNOSIS — E785 Hyperlipidemia, unspecified: Secondary | ICD-10-CM

## 2014-04-21 LAB — POCT URINALYSIS DIPSTICK
Bilirubin, UA: NEGATIVE
Glucose, UA: NEGATIVE
Ketones, UA: NEGATIVE
Nitrite, UA: NEGATIVE
Spec Grav, UA: 1.01
Urobilinogen, UA: 0.2
pH, UA: 6

## 2014-04-21 LAB — BASIC METABOLIC PANEL
BUN: 20 mg/dL (ref 6–23)
CO2: 28 mEq/L (ref 19–32)
Calcium: 9.4 mg/dL (ref 8.4–10.5)
Chloride: 101 mEq/L (ref 96–112)
Creatinine, Ser: 1.28 mg/dL — ABNORMAL HIGH (ref 0.40–1.20)
GFR: 52.81 mL/min — ABNORMAL LOW (ref >60.00)
Glucose, Bld: 155 mg/dL — ABNORMAL HIGH (ref 70–99)
Potassium: 3.9 mEq/L (ref 3.5–5.1)
Sodium: 137 mEq/L (ref 135–145)

## 2014-04-21 LAB — HEMOGLOBIN A1C: Hgb A1c MFr Bld: 6.7 % — ABNORMAL HIGH (ref 4.6–6.5)

## 2014-04-21 MED ORDER — ROSUVASTATIN CALCIUM 10 MG PO TABS
10.0000 mg | ORAL_TABLET | ORAL | Status: DC
Start: 2014-04-21 — End: 2014-04-30

## 2014-04-21 MED ORDER — AMLODIPINE BESYLATE 2.5 MG PO TABS: 2.5000 mg | ORAL_TABLET | Freq: Every day | ORAL | Status: DC

## 2014-04-21 MED ORDER — CIPROFLOXACIN HCL 500 MG PO TABS: 500.0000 mg | ORAL_TABLET | Freq: Two times a day (BID) | ORAL | Status: DC

## 2014-04-21 NOTE — Patient Instructions (Signed)

## 2014-04-21 NOTE — Progress Notes (Signed)
Subjective:    Patient ID: Dawn Salas, female    DOB: 09-19-1940, 74 y.o.   MRN: [id number]  HPI Patient is seen for medical follow-up. She has history of multiple chronic problems including obesity, type 2 diabetes, dyslipidemia, hypertension, hyperthyroidism, meningiomas of the brain. She is followed by endocrinologist in [facility] regarding her thyroid.  Type 2 diabetes which has been well controlled. Currently takes low-dose metformin. Last A1c 6.4%. Blood sugars remain consistently less than 130 fasting.  New issue of dysuria with onset this past Sunday. She's had some gross hematuria. No flank pain. She's noticed increased odor and burning with urination. No fevers or chills. No nausea or vomiting.  Hypertension. She was having some headache and we reduced her amlodipine to 2.5 mg. Blood pressure has remained stable and she has had no headaches since reducing dosage.  Hyperlipidemia treated with low-dose Crestor. Lipitor greatly improved last visit. Requesting refills of Crestor today.  Past Medical History  Diagnosis Date  . Allergy   . Asthma   . Hyperlipidemia   . Hypertension   . Depression   . GERD (gastroesophageal reflux disease)   . Peptic ulcer disease   . Palpitations   . Multiple meningiomas of spine and brain   . Hyperglycemia   . Obstructive sleep apnea     STUDY AT [facility] DOES NOT USE C PAP  . Hyperthyroidism     PT HAD BIPOSY -NEGATIVE.Marland Kitchen AND NOW JUST GETS CHECKED EACH YEAR .Marland Kitchen 2013 LAST TEST   SEES DR. [name].   Marland Kitchen Arthritis   . Shortness of breath dyspnea     Has SOB with palpitations takes Metoprolol  . Pneumonia     "walking pneumonia" hx of  . Diabetes mellitus     Type two  . Seizures     has seizure after having second brain surgery  . Frequency of urination   . Diverticulitis   . History of hiatal hernia    Past Surgical History  Procedure Laterality Date  . Tonsillectomy    . Tubal ligation    . Right knee arthroscopy      x 3  .  Right foot bunectomy    . Right shoulder rotator cuff    . L4-l5 posterior la    . C5-c6 neck fusion    . Left foot plantar and hammertoe    . Back surgery      L SPIN 2003   NECK 2004  . Dilation and curettage of uterus      YEARS AGO  . Craniotomy  08/13/2011    Procedure: CRANIOTOMY TUMOR EXCISION;  Surgeon: [name], MD;  Location: [facility];  Service: Neurosurgery;  Laterality: Bilateral;  Bifrontal craniotomy for tumor  . Abdominal hysterectomy    . Brain meningioma excision  8/10    X 2  . Wrist surgery Right   . Colonoscopy      reports that she has never smoked. She has never used smokeless tobacco. She reports that she does not drink alcohol or use illicit drugs. family history includes Anesthesia problems in her daughter; Arthritis in her other; Cancer in her mother; Diabetes in her other; Heart disease (age of onset: 59) in her father; Hyperlipidemia in her other; Hypertension in her other. Allergies  Allergen Reactions  . Flagyl [Metronidazole] Other (See Comments)    Caused increased heart rate  . Aspirin Effervescent Swelling    Alka-Seltzer gel caps- sore mouth, face swollen, turned  hands white  . Morphine Sulfate     REACTION: hives  . Penicillins     REACTION: rash      Review of Systems  Constitutional: Negative for fatigue.  Eyes: Negative for visual disturbance.  Respiratory: Negative for cough, chest tightness, shortness of breath and wheezing.   Cardiovascular: Negative for chest pain, palpitations and leg swelling.  Genitourinary: Positive for dysuria and frequency.  Neurological: Negative for dizziness, seizures, syncope, weakness, light-headedness and headaches.       Objective:   Physical Exam  Constitutional: She appears well-developed and well-nourished.  Neck: Neck supple.  Cardiovascular: Normal rate and regular rhythm.  Exam reveals no gallop.   Pulmonary/Chest: Effort normal and breath sounds normal. No respiratory distress.  She has no wheezes. She has no rales.  Musculoskeletal: She exhibits no edema.          Assessment & Plan:  #1 acute cystitis. Urine culture sent. Cipro 500 mg twice a day for 7 days pending culture results. Increase fluid intake #2 type 2 diabetes. History of good control. Recheck A1c #3 hypertension which is stable with recent reduction in amlodipine. Refill amlodipine for one year #4 dyslipidemia. Recent lipids improved. Refill Crestor for one year

## 2014-04-21 NOTE — Progress Notes (Signed)
Pre visit review using our clinic review tool, if applicable. No additional management support is needed unless otherwise documented below in the visit note. 

## 2014-04-24 LAB — URINE CULTURE: Colony Count: >=100000

## 2014-04-26 ENCOUNTER — Telehealth: Payer: Self-pay | Admitting: Family Medicine

## 2014-04-26 NOTE — Telephone Encounter (Signed)
Patient would like a callback regarding her still having symptoms of a bladder infection.

## 2014-04-26 NOTE — Telephone Encounter (Signed)
Pt informed

## 2014-04-30 ENCOUNTER — Encounter: Payer: Self-pay | Admitting: Family Medicine

## 2014-04-30 ENCOUNTER — Ambulatory Visit (INDEPENDENT_AMBULATORY_CARE_PROVIDER_SITE_OTHER): Payer: Medicare Other | Admitting: Family Medicine

## 2014-04-30 VITALS — BP 130/80 | HR 79 | Temp 98.0°F | Wt 203.0 lb

## 2014-04-30 DIAGNOSIS — N842 Polyp of vagina: Secondary | ICD-10-CM | POA: Diagnosis not present

## 2014-04-30 DIAGNOSIS — R300 Dysuria: Secondary | ICD-10-CM

## 2014-04-30 LAB — POCT URINALYSIS DIPSTICK
Bilirubin, UA: NEGATIVE
Glucose, UA: NEGATIVE
Nitrite, UA: NEGATIVE
Protein, UA: NEGATIVE
Spec Grav, UA: 1.015
Urobilinogen, UA: 0.2
pH, UA: 6

## 2014-04-30 MED ORDER — ROSUVASTATIN CALCIUM 10 MG PO TABS
10.0000 mg | ORAL_TABLET | ORAL | Status: DC
Start: 2014-04-30 — End: 2015-08-19

## 2014-04-30 MED ORDER — CEPHALEXIN 500 MG PO CAPS: 500.0000 mg | ORAL_CAPSULE | Freq: Three times a day (TID) | ORAL | Status: DC

## 2014-04-30 NOTE — Progress Notes (Signed)
Subjective:    Patient ID: Dawn Salas, female    DOB: 10-10-1940, 74 y.o.   MRN: [id number]  HPI Patient seen with recurrent/persistent dysuria. She was seen here just little over week ago and had Escherichia coli UTI which was sensitive to Cipro. She felt better after couple days on Cipro but then had return of symptoms. She's not had any recent fever. She recently was palpating down around the urethral area and felt that she palpated a "knot ". Nonpainful. No back pain. No nausea or vomiting. History of previous partial hysterectomy. No obstructive urinary symptoms. No recent appetite or weight changes. No vaginal bleeding. Denies any flank pain.  Past Medical History  Diagnosis Date  . Allergy   . Asthma   . Hyperlipidemia   . Hypertension   . Depression   . GERD (gastroesophageal reflux disease)   . Peptic ulcer disease   . Palpitations   . Multiple meningiomas of spine and brain   . Hyperglycemia   . Obstructive sleep apnea     STUDY AT [facility] DOES NOT USE C PAP  . Hyperthyroidism     PT HAD BIPOSY -NEGATIVE.Marland Kitchen AND NOW JUST GETS CHECKED EACH YEAR .Marland Kitchen 2013 LAST TEST   SEES DR. [name].   Marland Kitchen Arthritis   . Shortness of breath dyspnea     Has SOB with palpitations takes Metoprolol  . Pneumonia     "walking pneumonia" hx of  . Diabetes mellitus     Type two  . Seizures     has seizure after having second brain surgery  . Frequency of urination   . Diverticulitis   . History of hiatal hernia    Past Surgical History  Procedure Laterality Date  . Tonsillectomy    . Tubal ligation    . Right knee arthroscopy      x 3  . Right foot bunectomy    . Right shoulder rotator cuff    . L4-l5 posterior la    . C5-c6 neck fusion    . Left foot plantar and hammertoe    . Back surgery      L SPIN 2003   NECK 2004  . Dilation and curettage of uterus      YEARS AGO  . Craniotomy  08/13/2011    Procedure: CRANIOTOMY TUMOR EXCISION;  Surgeon: [name], MD;  Location: [facility];  Service: Neurosurgery;  Laterality: Bilateral;  Bifrontal craniotomy for tumor  . Abdominal hysterectomy    . Brain meningioma excision  8/10    X 2  . Wrist surgery Right   . Colonoscopy      reports that she has never smoked. She has never used smokeless tobacco. She reports that she does not drink alcohol or use illicit drugs. family history includes Anesthesia problems in her daughter; Arthritis in her other; Cancer in her mother; Diabetes in her other; Heart disease (age of onset: 44) in her father; Hyperlipidemia in her other; Hypertension in her other. Allergies  Allergen Reactions  . Flagyl [Metronidazole] Other (See Comments)    Caused increased heart rate  . Aspirin Effervescent Swelling    Alka-Seltzer gel caps- sore mouth, face swollen, turned hands white  . Morphine Sulfate     REACTION: hives  . Penicillins     REACTION: rash      Review of Systems  Constitutional: Negative for fever, chills, appetite change and unexpected weight change.  Gastrointestinal: Negative for nausea, vomiting, abdominal  pain, diarrhea and constipation.  Genitourinary: Positive for dysuria and frequency. Negative for hematuria, vaginal pain and pelvic pain.  Musculoskeletal: Negative for back pain.  Neurological: Negative for dizziness.  Hematological: Negative for adenopathy.       Objective:   Physical Exam  Constitutional: She appears well-developed and well-nourished.  Cardiovascular: Normal rate and regular rhythm.   Pulmonary/Chest: Effort normal and breath sounds normal. No respiratory distress. She has no wheezes. She has no rales.  Genitourinary:  Patient has small fleshy colored benign appearing polyp just below the urethra region of vagina. There is no erythema. Nontender. Very well demarcated borders and soft to palpation          Assessment & Plan:  #1 recurrent dysuria. Urine culture sent. Keflex 500 mg 3 times a day pending culture. Somewhat surprising  that she has recurrence of symptoms after recent completion of Cipro #2 benign-appearing vaginal polyp. She will bring this to attention of her GYN with whom she has appointment within one week.

## 2014-04-30 NOTE — Progress Notes (Signed)
Pre visit review using our clinic review tool, if applicable. No additional management support is needed unless otherwise documented below in the visit note. 

## 2014-04-30 NOTE — Addendum Note (Signed)
Addended by: Eulas Post on: 04/30/2014 10:57 AM   Modules accepted: Orders

## 2014-04-30 NOTE — Patient Instructions (Addendum)
Urinary Tract Infection Urinary tract infections (UTIs) can develop anywhere along your urinary tract. Your urinary tract is your body's drainage system for removing wastes and extra water. Your urinary tract includes two kidneys, two ureters, a bladder, and a urethra. Your kidneys are a pair of bean-shaped organs. Each kidney is about the size of your fist. They are located below your ribs, one on each side of your spine. CAUSES Infections are caused by microbes, which are microscopic organisms, including fungi, viruses, and bacteria. These organisms are so small that they can only be seen through a microscope. Bacteria are the microbes that most commonly cause UTIs. SYMPTOMS  Symptoms of UTIs may vary by age and gender of the patient and by the location of the infection. Symptoms in young women typically include a frequent and intense urge to urinate and a painful, burning feeling in the bladder or urethra during urination. Older women and men are more likely to be tired, shaky, and weak and have muscle aches and abdominal pain. A fever may mean the infection is in your kidneys. Other symptoms of a kidney infection include pain in your back or sides below the ribs, nausea, and vomiting. DIAGNOSIS To diagnose a UTI, your caregiver will ask you about your symptoms. Your caregiver also will ask to provide a urine sample. The urine sample will be tested for bacteria and white blood cells. White blood cells are made by your body to help fight infection. TREATMENT  Typically, UTIs can be treated with medication. Because most UTIs are caused by a bacterial infection, they usually can be treated with the use of antibiotics. The choice of antibiotic and length of treatment depend on your symptoms and the type of bacteria causing your infection. HOME CARE INSTRUCTIONS  If you were prescribed antibiotics, take them exactly as your caregiver instructs you. Finish the medication even if you feel better after you  have only taken some of the medication.  Drink enough water and fluids to keep your urine clear or pale yellow.  Avoid caffeine, tea, and carbonated beverages. They tend to irritate your bladder.  Empty your bladder often. Avoid holding urine for long periods of time.  Empty your bladder before and after sexual intercourse.  After a bowel movement, women should cleanse from front to back. Use each tissue only once. SEEK MEDICAL CARE IF:   You have back pain.  You develop a fever.  Your symptoms do not begin to resolve within 3 days. SEEK IMMEDIATE MEDICAL CARE IF:   You have severe back pain or lower abdominal pain.  You develop chills.  You have nausea or vomiting.  You have continued burning or discomfort with urination. MAKE SURE YOU:   Understand these instructions.  Will watch your condition.  Will get help right away if you are not doing well or get worse. Document Released: 10/18/2004 Document Revised: 07/10/2011 Document Reviewed: 02/16/2011 [facility] Patient Information 2015 [place], [place]. This information is not intended to replace advice given to you by your health care provider. Make sure you discuss any questions you have with your health care provider.   The "knot" you are palpating looks like a benign polyp below the urethra. Bring this to the attention of your GYN when you see them next week

## 2014-05-03 LAB — URINE CULTURE: Colony Count: >=100000

## 2014-05-14 ENCOUNTER — Other Ambulatory Visit: Payer: Self-pay | Admitting: Family Medicine

## 2014-06-07 ENCOUNTER — Other Ambulatory Visit: Payer: Self-pay | Admitting: Family Medicine

## 2014-06-09 ENCOUNTER — Other Ambulatory Visit: Payer: Self-pay | Admitting: Family Medicine

## 2014-08-12 ENCOUNTER — Telehealth: Payer: Self-pay | Admitting: Family Medicine

## 2014-08-12 NOTE — Telephone Encounter (Signed)
Pt states that she will make appointment to be seen.

## 2014-08-12 NOTE — Telephone Encounter (Signed)
If she has concern for diverticulitis, really needs to be seen to evaluate need for antibiotics.

## 2014-08-12 NOTE — Telephone Encounter (Signed)
Pt said she has diverticulitis and she has a flare up and is asking for some pain medication.    Pharmacy ; [facility]  [place]

## 2014-08-15 ENCOUNTER — Encounter (HOSPITAL_COMMUNITY): Payer: Self-pay | Admitting: Emergency Medicine

## 2014-08-15 ENCOUNTER — Emergency Department (HOSPITAL_COMMUNITY)
Admission: EM | Admit: 2014-08-15 | Discharge: 2014-08-15 | Disposition: A | Discharge status: Home / Self Care | Payer: Medicare Other | Attending: Emergency Medicine | Admitting: Emergency Medicine

## 2014-08-15 DIAGNOSIS — Z862 Personal history of diseases of the blood and blood-forming organs and certain disorders involving the immune mechanism: Secondary | ICD-10-CM | POA: Insufficient documentation

## 2014-08-15 DIAGNOSIS — Z8739 Personal history of other diseases of the musculoskeletal system and connective tissue: Secondary | ICD-10-CM | POA: Diagnosis not present

## 2014-08-15 DIAGNOSIS — Z792 Long term (current) use of antibiotics: Secondary | ICD-10-CM | POA: Insufficient documentation

## 2014-08-15 DIAGNOSIS — K219 Gastro-esophageal reflux disease without esophagitis: Secondary | ICD-10-CM | POA: Diagnosis not present

## 2014-08-15 DIAGNOSIS — R1084 Generalized abdominal pain: Secondary | ICD-10-CM | POA: Diagnosis not present

## 2014-08-15 DIAGNOSIS — Z79899 Other long term (current) drug therapy: Secondary | ICD-10-CM | POA: Diagnosis not present

## 2014-08-15 DIAGNOSIS — I10 Essential (primary) hypertension: Secondary | ICD-10-CM | POA: Diagnosis not present

## 2014-08-15 DIAGNOSIS — Z8711 Personal history of peptic ulcer disease: Secondary | ICD-10-CM | POA: Insufficient documentation

## 2014-08-15 DIAGNOSIS — J45901 Unspecified asthma with (acute) exacerbation: Secondary | ICD-10-CM | POA: Insufficient documentation

## 2014-08-15 DIAGNOSIS — E0590 Thyrotoxicosis, unspecified without thyrotoxic crisis or storm: Secondary | ICD-10-CM | POA: Insufficient documentation

## 2014-08-15 DIAGNOSIS — Z8669 Personal history of other diseases of the nervous system and sense organs: Secondary | ICD-10-CM | POA: Insufficient documentation

## 2014-08-15 DIAGNOSIS — E1165 Type 2 diabetes mellitus with hyperglycemia: Secondary | ICD-10-CM | POA: Insufficient documentation

## 2014-08-15 DIAGNOSIS — Z9851 Tubal ligation status: Secondary | ICD-10-CM | POA: Diagnosis not present

## 2014-08-15 DIAGNOSIS — Z8701 Personal history of pneumonia (recurrent): Secondary | ICD-10-CM | POA: Diagnosis not present

## 2014-08-15 DIAGNOSIS — Z90710 Acquired absence of both cervix and uterus: Secondary | ICD-10-CM | POA: Diagnosis not present

## 2014-08-15 DIAGNOSIS — R109 Unspecified abdominal pain: Secondary | ICD-10-CM | POA: Diagnosis present

## 2014-08-15 DIAGNOSIS — Z880 Allergy status to penicillin: Secondary | ICD-10-CM | POA: Diagnosis not present

## 2014-08-15 DIAGNOSIS — E785 Hyperlipidemia, unspecified: Secondary | ICD-10-CM | POA: Diagnosis not present

## 2014-08-15 DIAGNOSIS — Z86011 Personal history of benign neoplasm of the brain: Secondary | ICD-10-CM | POA: Diagnosis not present

## 2014-08-15 LAB — CBC WITH DIFFERENTIAL/PLATELET
Basophils Absolute: 0 10*3/uL (ref 0.0–0.1)
Basophils Relative: 0 % (ref 0–1)
Eosinophils Absolute: 0.1 10*3/uL (ref 0.0–0.7)
Eosinophils Relative: 1 % (ref 0–5)
HCT: 35.4 % — ABNORMAL LOW (ref 36.0–46.0)
Hemoglobin: 11.5 g/dL — ABNORMAL LOW (ref 12.0–15.0)
Lymphocytes Relative: 16 % (ref 12–46)
Lymphs Abs: 1.2 10*3/uL (ref 0.7–4.0)
MCH: 25.2 pg — ABNORMAL LOW (ref 26.0–34.0)
MCHC: 32.5 g/dL (ref 30.0–36.0)
MCV: 77.5 fL — ABNORMAL LOW (ref 78.0–100.0)
Monocytes Absolute: 0.4 10*3/uL (ref 0.1–1.0)
Monocytes Relative: 6 % (ref 3–12)
Neutro Abs: 5.7 10*3/uL (ref 1.7–7.7)
Neutrophils Relative %: 77 % (ref 43–77)
Platelets: 223 10*3/uL (ref 150–400)
RBC: 4.57 MIL/uL (ref 3.87–5.11)
RDW: 13.5 % (ref 11.5–15.5)
WBC: 7.4 10*3/uL (ref 4.0–10.5)

## 2014-08-15 LAB — URINALYSIS, ROUTINE W REFLEX MICROSCOPIC
Bilirubin Urine: NEGATIVE
Glucose, UA: NEGATIVE mg/dL
Hgb urine dipstick: NEGATIVE
Ketones, ur: NEGATIVE mg/dL
Leukocytes, UA: NEGATIVE
Nitrite: NEGATIVE
Protein, ur: NEGATIVE mg/dL
Specific Gravity, Urine: 1.015 (ref 1.005–1.030)
Urobilinogen, UA: 0.2 mg/dL (ref 0.0–1.0)
pH: 7.5 (ref 5.0–8.0)

## 2014-08-15 LAB — COMPREHENSIVE METABOLIC PANEL
ALT: 17 U/L (ref 14–54)
AST: 30 U/L (ref 15–41)
Albumin: 3.7 g/dL (ref 3.5–5.0)
Alkaline Phosphatase: 95 U/L (ref 38–126)
Anion gap: 10 (ref 5–15)
BUN: 13 mg/dL (ref 6–20)
CO2: 27 mmol/L (ref 22–32)
Calcium: 9 mg/dL (ref 8.9–10.3)
Chloride: 102 mmol/L (ref 101–111)
Creatinine, Ser: 0.97 mg/dL (ref 0.44–1.00)
GFR calc Af Amer: >60 mL/min (ref >60)
GFR calc non Af Amer: 57 mL/min — ABNORMAL LOW (ref >60)
Glucose, Bld: 118 mg/dL — ABNORMAL HIGH (ref 65–99)
Potassium: 3.8 mmol/L (ref 3.5–5.1)
Sodium: 139 mmol/L (ref 135–145)
Total Bilirubin: 0.4 mg/dL (ref 0.3–1.2)
Total Protein: 6.8 g/dL (ref 6.5–8.1)

## 2014-08-15 LAB — CBG MONITORING, ED: Glucose-Capillary: 70 mg/dL (ref 65–99)

## 2014-08-15 MED ORDER — TRAMADOL HCL 50 MG PO TABS: 50.0000 mg | ORAL_TABLET | Freq: Four times a day (QID) | ORAL | Status: DC | PRN

## 2014-08-15 MED ORDER — MOXIFLOXACIN HCL 400 MG PO TABS
400.0000 mg | ORAL_TABLET | Freq: Every day | ORAL | Status: DC
Start: 2014-08-15 — End: 2015-04-04

## 2014-08-15 MED ORDER — LEVOFLOXACIN IN D5W 750 MG/150ML IV SOLN
750.0000 mg | Freq: Once | INTRAVENOUS | Status: AC
  Administered 2014-08-15: 750 mg via INTRAVENOUS
  Filled 2014-08-15: qty 150

## 2014-08-15 MED ORDER — PROMETHAZINE HCL 25 MG PO TABS: 25.0000 mg | ORAL_TABLET | Freq: Four times a day (QID) | ORAL | Status: DC | PRN

## 2014-08-15 NOTE — Discharge Instructions (Signed)
Thank you for allowing [place] to treat U today. Your blood work was unremarkable, your urine test was normal, we are treating him for a disease called diverticulitis. Please routine attachment regarding this illness. Because you have multiple allergies it is difficult to treat you with pain medication or antibiotics however the best medications given your underlying current medications and allergies are the following  #1 Avelox one tablet by mouth daily for 10 days  #2 tramadol 50 mg tablet by mouth as needed for pain, this may react with her other medications and is similar to morphine, if you have an allergy to morphine please do not take this medication.  #3 Phenergan, for nausea, 1 tablet every 12 hours as needed   Please obtain all of your results from medical records or have your doctors office obtain the results - share them with your doctor - you should be seen at your doctors office in the next 2 days. Call today to arrange your follow up. Take the medications as prescribed. Please review all of the medicines and only take them if you do not have an allergy to them. Please be aware that if you are taking birth control pills, taking other prescriptions, ESPECIALLY ANTIBIOTICS may make the birth control ineffective - if this is the case, either do not engage in sexual activity or use alternative methods of birth control such as condoms until you have finished the medicine and your family doctor says it is OK to restart them. If you are on a blood thinner such as COUMADIN, be aware that any other medicine that you take may cause the coumadin to either work too much, or not enough - you should have your coumadin level rechecked in next 7 days if this is the case.  ?  It is also a possibility that you have an allergic reaction to any of the medicines that you have been prescribed - Everybody reacts differently to medications and while MOST people have no trouble with most medicines, you may have a reaction  such as nausea, vomiting, rash, swelling, shortness of breath. If this is the case, please stop taking the medicine immediately and contact your physician.  ?  You should return to the ER if you develop severe or worsening symptoms.

## 2014-08-15 NOTE — ED Provider Notes (Signed)
CSN: [id number]     Arrival date & time 08/15/14  1336 History   First MD Initiated Contact with Patient 08/15/14 1530     Chief Complaint  Patient presents with  . Abdominal Pain     (Consider location/radiation/quality/duration/timing/severity/associated sxs/prior Treatment) HPI Comments: 74 year old female, history of multiple episodes of diverticulitis over the past several years, has had a colonoscopy confirming diverticular disease and has been successfully treated with antibiotics for her other cases of diverticulitis. She reports approximately 1 week of having a significant pressure in her lower abdomen mostly on the left side, relieved at one time with significant diarrhea but came back immediately. Now she is having normal stools, no bleeding, no nausea vomiting chest pain shortness of breath fevers or chills. She denies any urinary symptoms, has been able to eat and drink without difficulty, the pain is not made worse with eating drinking or bowel movements, it is made worse with position, it is better when she lays flat, worse on her left side.  Patient is a 74 y.o. female presenting with abdominal pain. The history is provided by the patient.  Abdominal Pain   Past Medical History  Diagnosis Date  . Allergy   . Asthma   . Hyperlipidemia   . Hypertension   . Depression   . GERD (gastroesophageal reflux disease)   . Peptic ulcer disease   . Palpitations   . Multiple meningiomas of spine and brain   . Hyperglycemia   . Obstructive sleep apnea     STUDY [facility] DOES NOT USE C PAP  . Hyperthyroidism     PT HAD BIPOSY -NEGATIVE.Marland Kitchen AND NOW JUST GETS CHECKED EACH YEAR .Marland Kitchen 2013 LAST TEST   SEES DR. [name].   Marland Kitchen Arthritis   . Shortness of breath dyspnea     Has SOB with palpitations takes Metoprolol  . Pneumonia     "walking pneumonia" hx of  . Diabetes mellitus     Type two  . Seizures     has seizure after having second brain surgery  . Frequency of urination   .  Diverticulitis   . History of hiatal hernia    Past Surgical History  Procedure Laterality Date  . Tonsillectomy    . Tubal ligation    . Right knee arthroscopy      x 3  . Right foot bunectomy    . Right shoulder rotator cuff    . L4-l5 posterior la    . C5-c6 neck fusion    . Left foot plantar and hammertoe    . Back surgery      L SPIN 2003   NECK 2004  . Dilation and curettage of uterus      YEARS AGO  . Craniotomy  08/13/2011    Procedure: CRANIOTOMY TUMOR EXCISION;  Surgeon: [name], MD;  Location: [facility];  Service: Neurosurgery;  Laterality: Bilateral;  Bifrontal craniotomy for tumor  . Abdominal hysterectomy    . Brain meningioma excision  8/10    X 2  . Wrist surgery Right   . Colonoscopy     Family History  Problem Relation Age of Onset  . Cancer Mother     ovarian  . Heart disease Father 59  . Arthritis Other   . Hyperlipidemia Other   . Hypertension Other   . Diabetes Other   . Anesthesia problems Daughter     NAUSEA AND VOMITING POST OP   History  Substance Use  Topics  . Smoking status: Never Smoker   . Smokeless tobacco: Never Used  . Alcohol Use: No   OB History    No data available     Review of Systems  Gastrointestinal: Positive for abdominal pain.  All other systems reviewed and are negative.     Allergies  Flagyl; Aspirin effervescent; Morphine sulfate; and Penicillins  Home Medications   Prior to Admission medications   Medication Sig Start Date End Date Taking? Authorizing Provider  amLODipine (NORVASC) 2.5 MG tablet Take 1 tablet (2.5 mg total) by mouth daily. 04/21/14  Yes Eulas Post, MD  cephALEXin (KEFLEX) 500 MG capsule Take 1 capsule (500 mg total) by mouth 3 (three) times daily. 04/30/14   Eulas Post, MD  cetirizine (ZYRTEC) 10 MG tablet Take 10 mg by mouth daily as needed. For allergies    Historical Provider, MD  Cyanocobalamin (VITAMIN B12 PO) Take 1 mL by mouth daily. OTC    Historical  Provider, MD  furosemide (LASIX) 40 MG tablet Take 20 mg by mouth daily as needed. For fluid    Historical Provider, MD  glucose blood (ACCU-CHEK AVIVA PLUS) test strip 1 each by Other route 2 (two) times daily. E11.9 01/20/14   Eulas Post, MD  Iron-Vitamins (GERITOL COMPLETE PO) Take 1 tablet by mouth daily.    Historical Provider, MD  Lancet Devices [facility]) lancets 1 each by Other route 2 (two) times daily. E11.9 01/20/14   Eulas Post, MD  levothyroxine (SYNTHROID, LEVOTHROID) 100 MCG tablet Take 100 mcg by mouth daily before breakfast.    Historical Provider, MD  losartan-hydrochlorothiazide (HYZAAR) 100-12.5 MG per tablet TAKE 1 TABLET DAILY 11/23/13   Eulas Post, MD  meclizine (ANTIVERT) 25 MG tablet Take 25 mg by mouth 3 (three) times daily as needed for dizziness.    Historical Provider, MD  metFORMIN (GLUCOPHAGE) 500 MG tablet TAKE ONE TABLET TWICE A DAY WITH FOOD 05/14/14   Eulas Post, MD  metoprolol succinate (TOPROL-XL) 100 MG 24 hr tablet TAKE ONE (1) TABLET EACH DAY 11/20/13   Eulas Post, MD  moxifloxacin (AVELOX) 400 MG tablet Take 1 tablet (400 mg total) by mouth daily. 08/15/14   Noemi Chapel, MD  potassium chloride (K-DUR) 10 MEQ tablet TAKE ONE (1) TABLET EACH DAY 06/07/14   Eulas Post, MD  promethazine (PHENERGAN) 25 MG tablet Take 1 tablet (25 mg total) by mouth every 6 (six) hours as needed for nausea or vomiting. 08/15/14   Noemi Chapel, MD  RABEprazole (ACIPHEX) 20 MG tablet TAKE ONE (1) TABLET EACH DAY 05/14/14   Eulas Post, MD  rosuvastatin (CRESTOR) 10 MG tablet Take 1 tablet (10 mg total) by mouth once a week. Take one tablet once a week 04/30/14   Eulas Post, MD  traMADol (ULTRAM) 50 MG tablet Take 1 tablet (50 mg total) by mouth every 6 (six) hours as needed. 08/15/14   Noemi Chapel, MD  valACYclovir (VALTREX) 1000 MG tablet TAKE 2 TABLETS EVERY 12 HOURS FOR 1 DAY,AND THEN AS NEEDED 02/22/14   Eulas Post, MD   VESICARE 5 MG tablet TAKE 1 TABLET DAILY 06/09/14   Eulas Post, MD   BP 163/88 mmHg  Pulse 98  Temp(Src) 98.6 F (37 C) (Oral)  Resp 16  Ht 5\' 4"  (1.626 m)  Wt 203 lb (92.08 kg)  BMI 34.83 kg/m2  SpO2 97% Physical Exam  Constitutional: She appears well-developed and well-nourished. No  distress.  HENT:  Head: Normocephalic and atraumatic.  Mouth/Throat: Oropharynx is clear and moist. No oropharyngeal exudate.  Eyes: Conjunctivae and EOM are normal. Pupils are equal, round, and reactive to light. Right eye exhibits no discharge. Left eye exhibits no discharge. No scleral icterus.  Neck: Normal range of motion. Neck supple. No JVD present. No thyromegaly present.  Cardiovascular: Normal rate, regular rhythm, normal heart sounds and intact distal pulses.  Exam reveals no gallop and no friction rub.   No murmur heard. Pulmonary/Chest: Effort normal and breath sounds normal. No respiratory distress. She has no wheezes. She has no rales.  Abdominal: Soft. Bowel sounds are normal. She exhibits no distension and no mass. There is tenderness ( Moderate tenderness to the left lower quadrant, minimal tenderness to the suprapubic and right lower quadrant, no upper abdominal tenderness or guarding).  No CVA tenderness  Musculoskeletal: Normal range of motion. She exhibits no edema or tenderness.  Lymphadenopathy:    She has no cervical adenopathy.  Neurological: She is alert. Coordination normal.  Skin: Skin is warm and dry. No rash noted. No erythema.  Psychiatric: She has a normal mood and affect. Her behavior is normal.  Nursing note and vitals reviewed.   ED Course  Procedures (including critical care time) Labs Review Labs Reviewed  CBC WITH DIFFERENTIAL/PLATELET - Abnormal; Notable for the following:    Hemoglobin 11.5 (*)    HCT 35.4 (*)    MCV 77.5 (*)    MCH 25.2 (*)    All other components within normal limits  COMPREHENSIVE METABOLIC PANEL - Abnormal; Notable for the  following:    Glucose, Bld 118 (*)    GFR calc non Af Amer 57 (*)    All other components within normal limits  URINALYSIS, ROUTINE W REFLEX MICROSCOPIC (NOT AT [facility])  CBG MONITORING, ED    Imaging Review No results found.    MDM   Final diagnoses:  Generalized abdominal pain    Well-appearing, vital signs show mild hypertension, the patient has multiple allergies which restricts her ability to take antibiotics for diverticular disease. According to the [facility] guide to antimicrobials therapy, Avelox would be an acceptable alternative as monotherapy..  I discussed with the patient at length the indications for CT scan, we will wait for labs to decide on imaging though at this time clinically it is appropriate to treat her for diverticulitis without imaging as I doubt perforation or abscess. There is no fever, focal tenderness, no peritoneal signs. The patient is in total agreement with this plan and declined CT scan unless there is overwhelming abnormal labs to suggest the need for such.   I discussed with the patient at length her results, she appears well, is amenable to the plan of oral antibiotics and follow-up, CBC without leukocytosis, urinalysis normal  Meds given in ED:  Medications  levofloxacin (LEVAQUIN) IVPB 750 mg (750 mg Intravenous New Bag/Given 08/15/14 1605)    New Prescriptions   MOXIFLOXACIN (AVELOX) 400 MG TABLET    Take 1 tablet (400 mg total) by mouth daily.   PROMETHAZINE (PHENERGAN) 25 MG TABLET    Take 1 tablet (25 mg total) by mouth every 6 (six) hours as needed for nausea or vomiting.   TRAMADOL (ULTRAM) 50 MG TABLET    Take 1 tablet (50 mg total) by mouth every 6 (six) hours as needed.      Noemi Chapel, MD 08/15/14 [phone]

## 2014-08-15 NOTE — ED Notes (Signed)
Having pain and pressure to lower abdomen.  Rates pain 7/10.  Did not take any pain medication today.

## 2014-08-16 ENCOUNTER — Other Ambulatory Visit: Payer: Self-pay | Admitting: Family Medicine

## 2014-08-17 ENCOUNTER — Encounter: Payer: Self-pay | Admitting: Family Medicine

## 2014-08-17 ENCOUNTER — Ambulatory Visit (INDEPENDENT_AMBULATORY_CARE_PROVIDER_SITE_OTHER): Payer: Medicare Other | Admitting: Family Medicine

## 2014-08-17 VITALS — BP 130/80 | HR 91 | Temp 97.7°F | Wt 199.0 lb

## 2014-08-17 DIAGNOSIS — K5792 Diverticulitis of intestine, part unspecified, without perforation or abscess without bleeding: Secondary | ICD-10-CM | POA: Diagnosis not present

## 2014-08-17 DIAGNOSIS — L720 Epidermal cyst: Secondary | ICD-10-CM | POA: Diagnosis not present

## 2014-08-17 DIAGNOSIS — E119 Type 2 diabetes mellitus without complications: Secondary | ICD-10-CM

## 2014-08-17 DIAGNOSIS — I10 Essential (primary) hypertension: Secondary | ICD-10-CM | POA: Diagnosis not present

## 2014-08-17 NOTE — Patient Instructions (Signed)
Epidermal Cyst An epidermal cyst is sometimes called a sebaceous cyst, epidermal inclusion cyst, or infundibular cyst. These cysts usually contain a substance that looks "pasty" or "cheesy" and may have a bad smell. This substance is a protein called keratin. Epidermal cysts are usually found on the face, neck, or trunk. They may also occur in the vaginal area or other parts of the genitalia of both men and women. Epidermal cysts are usually small, painless, slow-growing bumps or lumps that move freely under the skin. It is important not to try to pop them. This may cause an infection and lead to tenderness and swelling. CAUSES  Epidermal cysts may be caused by a deep penetrating injury to the skin or a plugged hair follicle, often associated with acne. SYMPTOMS  Epidermal cysts can become inflamed and cause:  Redness.  Tenderness.  Increased temperature of the skin over the bumps or lumps.  Grayish-white, bad smelling material that drains from the bump or lump. DIAGNOSIS  Epidermal cysts are easily diagnosed by your caregiver during an exam. Rarely, a tissue sample (biopsy) may be taken to rule out other conditions that may resemble epidermal cysts. TREATMENT   Epidermal cysts often get better and disappear on their own. They are rarely ever cancerous.  If a cyst becomes infected, it may become inflamed and tender. This may require opening and draining the cyst. Treatment with antibiotics may be necessary. When the infection is gone, the cyst may be removed with minor surgery.  Small, inflamed cysts can often be treated with antibiotics or by injecting steroid medicines.  Sometimes, epidermal cysts become large and bothersome. If this happens, surgical removal in your caregiver's office may be necessary. HOME CARE INSTRUCTIONS  Only take over-the-counter or prescription medicines as directed by your caregiver.  Take your antibiotics as directed. Finish them even if you start to feel  better. SEEK MEDICAL CARE IF:   Your cyst becomes tender, red, or swollen.  Your condition is not improving or is getting worse.  You have any other questions or concerns. MAKE SURE YOU:  Understand these instructions.  Will watch your condition.  Will get help right away if you are not doing well or get worse. Document Released: 12/10/2003 Document Revised: 04/02/2011 Document Reviewed: 07/17/2010 [employer] Patient Information 2015 [employer]. This information is not intended to replace advice given to you by your health care provider. Make sure you discuss any questions you have with your health care provider.  

## 2014-08-17 NOTE — Progress Notes (Signed)
Subjective:    Patient ID: Dawn Salas, female    DOB: [birth year], 74 y.o.   MRN: [id number]  HPI Here with multiple issues as follows  Recurrent left lower quadrant abdominal pain. Prior history of diverticulitis. Went to emergency department 2 days ago. White count was normal and she was afebrile. No imaging done. Patient placed on Avelox and she is somewhat improved today. No fevers or chills. No recent stool changes. Appetite is fair  Recurrent sebaceous cyst right upper back. Frequently itches. Drained once previously. She would like to have this excised. No rapid growth.  Type 2 diabetes. History of excellent control. Last A1c 6.7%. Recent fasting blood sugars consistently 90-120. No symptoms of hyperglycemia. Takes metformin. Hypothyroidism followed by endocrinology.  Hypertension which is treated with losartan HCTZ and metoprolol. Recent elevated blood pressure in ER but back down to normal today  Past Medical History  Diagnosis Date  . Allergy   . Asthma   . Hyperlipidemia   . Hypertension   . Depression   . GERD (gastroesophageal reflux disease)   . Peptic ulcer disease   . Palpitations   . Multiple meningiomas of spine and brain   . Hyperglycemia   . Obstructive sleep apnea     STUDY AT [facility] DOES NOT USE C PAP  . Hyperthyroidism     PT HAD BIPOSY -NEGATIVE.Marland Kitchen AND NOW JUST GETS CHECKED EACH YEAR .Marland Kitchen 2013 LAST TEST   SEES DR. [name].   Marland Kitchen Arthritis   . Shortness of breath dyspnea     Has SOB with palpitations takes Metoprolol  . Pneumonia     "walking pneumonia" hx of  . Diabetes mellitus     Type two  . Seizures     has seizure after having second brain surgery  . Frequency of urination   . Diverticulitis   . History of hiatal hernia    Past Surgical History  Procedure Laterality Date  . Tonsillectomy    . Tubal ligation    . Right knee arthroscopy      x 3  . Right foot bunectomy    . Right shoulder rotator cuff    . L4-l5 posterior la    . C5-c6  neck fusion    . Left foot plantar and hammertoe    . Back surgery      L SPIN 2003   NECK 2004  . Dilation and curettage of uterus      YEARS AGO  . Craniotomy  08/13/2011    Procedure: CRANIOTOMY TUMOR EXCISION;  Surgeon: [name], MD;  Location: [facility];  Service: Neurosurgery;  Laterality: Bilateral;  Bifrontal craniotomy for tumor  . Abdominal hysterectomy    . Brain meningioma excision  8/10    X 2  . Wrist surgery Right   . Colonoscopy      reports that she has never smoked. She has never used smokeless tobacco. She reports that she does not drink alcohol or use illicit drugs. family history includes Anesthesia problems in her daughter; Arthritis in her other; Cancer in her mother; Diabetes in her other; Heart disease (age of onset: 56) in her father; Hyperlipidemia in her other; Hypertension in her other. Allergies  Allergen Reactions  . Flagyl [Metronidazole] Other (See Comments)    Caused increased heart rate  . Aspirin Effervescent Swelling    Alka-Seltzer gel caps- sore mouth, face swollen, turned hands white  . Morphine Sulfate     REACTION: hives  .  Penicillins     REACTION: rash      Review of Systems  Constitutional: Negative for fatigue and unexpected weight change.  Eyes: Negative for visual disturbance.  Respiratory: Negative for cough, chest tightness, shortness of breath and wheezing.   Cardiovascular: Negative for chest pain, palpitations and leg swelling.  Gastrointestinal: Positive for abdominal pain. Negative for nausea, vomiting, diarrhea and blood in stool.  Endocrine: Negative for polydipsia and polyuria.  Genitourinary: Negative for dysuria.  Neurological: Negative for dizziness, seizures, syncope, weakness, light-headedness and headaches.       Objective:   Physical Exam  Constitutional: She appears well-developed and well-nourished.  Cardiovascular: Normal rate and regular rhythm.   Pulmonary/Chest: Effort normal and breath  sounds normal. No respiratory distress. She has no wheezes. She has no rales.  Musculoskeletal: She exhibits no edema.  Skin:  Small approximately 1 cm slightly mobile nontender sebaceous cyst right upper back. No erythema and no fluctuance          Assessment & Plan:  #1 recent acute diverticulitis improving on Avelox. Finish out Avelox. Low residue diet until acute symptoms subside then high-fiber diet #2 type 2 diabetes. History of excellent control. Recent A1c 6.7%. Recheck A1c at follow-up in one month for cyst excision #3 benign appearing sebaceous cyst right upper back. Patient requesting excision because of irritation and frequent itching. She will schedule for this #4 hypertension stable and at goal by reading today. Continue current medications.

## 2014-08-17 NOTE — Progress Notes (Signed)
Pre visit review using our clinic review tool, if applicable. No additional management support is needed unless otherwise documented below in the visit note. 

## 2014-09-17 ENCOUNTER — Ambulatory Visit (INDEPENDENT_AMBULATORY_CARE_PROVIDER_SITE_OTHER): Payer: Medicare Other | Admitting: Family Medicine

## 2014-09-17 ENCOUNTER — Encounter: Payer: Self-pay | Admitting: Family Medicine

## 2014-09-17 VITALS — BP 132/80 | HR 94 | Temp 97.5°F | Wt 200.0 lb

## 2014-09-17 DIAGNOSIS — E119 Type 2 diabetes mellitus without complications: Secondary | ICD-10-CM | POA: Diagnosis not present

## 2014-09-17 DIAGNOSIS — I10 Essential (primary) hypertension: Secondary | ICD-10-CM | POA: Diagnosis not present

## 2014-09-17 DIAGNOSIS — R300 Dysuria: Secondary | ICD-10-CM | POA: Diagnosis not present

## 2014-09-17 DIAGNOSIS — N390 Urinary tract infection, site not specified: Secondary | ICD-10-CM | POA: Diagnosis not present

## 2014-09-17 DIAGNOSIS — L723 Sebaceous cyst: Secondary | ICD-10-CM

## 2014-09-17 DIAGNOSIS — M79674 Pain in right toe(s): Secondary | ICD-10-CM

## 2014-09-17 LAB — HEMOGLOBIN A1C: Hgb A1c MFr Bld: 6.5 % (ref 4.6–6.5)

## 2014-09-17 LAB — POCT URINALYSIS DIPSTICK
Bilirubin, UA: NEGATIVE
Glucose, UA: NEGATIVE
Ketones, UA: NEGATIVE
Nitrite, UA: NEGATIVE
Protein, UA: NEGATIVE
Spec Grav, UA: 1.02
Urobilinogen, UA: 0.2
pH, UA: 6

## 2014-09-17 MED ORDER — CEPHALEXIN 500 MG PO CAPS: 500.0000 mg | ORAL_CAPSULE | Freq: Three times a day (TID) | ORAL | Status: DC

## 2014-09-17 NOTE — Patient Instructions (Signed)
Keep wound dry for the first 24 hours then clean daily with soap and water for one week. Apply topical antibiotic daily for 3-4 days. Keep covered with clean dressing for 4-5 days. Follow up promptly for any signs of infection such as redness, warmth, pain, or drainage.  

## 2014-09-17 NOTE — Progress Notes (Signed)
Subjective:    Patient ID: Dawn Salas, female    DOB: 1940-08-03, 74 y.o.   MRN: [id number]  HPI Patient here for several items  Initially here for sebaceous cyst excision right thoracic area. This is sometimes irritating. We explained this is benign but she requests excision. She has several other issues as follows  One-week history of cloudy urine with increased odor and burning with urination. Denies any fever or chills. She had Escherichia coli UTI back in April which was treated with Keflex. That infection was resistant to Cipro and couple of other drugs. No recent flank pain. No nausea or vomiting  Type 2 diabetes. Recent home fasting sugars reviewed and she has consistent good control. Last A1c 6.7% back in March  Right great toe pain. Present for a few days. No change of shoes. She's not noted any swelling, redness, or bruising. She has soreness to touch along the distal aspect.  Past Medical History  Diagnosis Date  . Allergy   . Asthma   . Hyperlipidemia   . Hypertension   . Depression   . GERD (gastroesophageal reflux disease)   . Peptic ulcer disease   . Palpitations   . Multiple meningiomas of spine and brain   . Hyperglycemia   . Obstructive sleep apnea     STUDY AT [facility] DOES NOT USE C PAP  . Hyperthyroidism     PT HAD BIPOSY -NEGATIVE.Marland Kitchen AND NOW JUST GETS CHECKED EACH YEAR .Marland Kitchen 2013 LAST TEST   SEES DR. [name].   Marland Kitchen Arthritis   . Shortness of breath dyspnea     Has SOB with palpitations takes Metoprolol  . Pneumonia     "walking pneumonia" hx of  . Diabetes mellitus     Type two  . Seizures     has seizure after having second brain surgery  . Frequency of urination   . Diverticulitis   . History of hiatal hernia    Past Surgical History  Procedure Laterality Date  . Tonsillectomy    . Tubal ligation    . Right knee arthroscopy      x 3  . Right foot bunectomy    . Right shoulder rotator cuff    . L4-l5 posterior la    . C5-c6 neck fusion    .  Left foot plantar and hammertoe    . Back surgery      L SPIN 2003   NECK 2004  . Dilation and curettage of uterus      YEARS AGO  . Craniotomy  08/13/2011    Procedure: CRANIOTOMY TUMOR EXCISION;  Surgeon: [name], MD;  Location: [facility];  Service: Neurosurgery;  Laterality: Bilateral;  Bifrontal craniotomy for tumor  . Abdominal hysterectomy    . Brain meningioma excision  8/10    X 2  . Wrist surgery Right   . Colonoscopy      reports that she has never smoked. She has never used smokeless tobacco. She reports that she does not drink alcohol or use illicit drugs. family history includes Anesthesia problems in her daughter; Arthritis in her other; Cancer in her mother; Diabetes in her other; Heart disease (age of onset: 71) in her father; Hyperlipidemia in her other; Hypertension in her other. Allergies  Allergen Reactions  . Flagyl [Metronidazole] Other (See Comments)    Caused increased heart rate  . Aspirin Effervescent Swelling    Alka-Seltzer gel caps- sore mouth, face swollen, turned hands white  .  Morphine Sulfate     REACTION: hives  . Penicillins     REACTION: rash      Review of Systems  Constitutional: Negative for fatigue and unexpected weight change.  Eyes: Negative for visual disturbance.  Respiratory: Negative for cough, chest tightness, shortness of breath and wheezing.   Cardiovascular: Negative for chest pain, palpitations and leg swelling.  Endocrine: Negative for polydipsia and polyuria.  Neurological: Negative for dizziness, seizures, syncope, weakness, light-headedness and headaches.       Objective:   Physical Exam  Constitutional: She appears well-developed and well-nourished.  Cardiovascular: Normal rate and regular rhythm.   Pulmonary/Chest: Effort normal and breath sounds normal. No respiratory distress. She has no wheezes. She has no rales.  Musculoskeletal: She exhibits no edema.  Right great toe no edema. No erythema. No  warmth. No ecchymosis. No evidence for her now. Minimally tender along the distal aspect.  Skin:  Right upper thoracic area she has approximately 1.5 cm slightly elevated sebaceous cyst. No fluctuance. Nontender. No erythema.          Assessment & Plan:  #1 sebaceous cyst right upper back region. This frequently itches and irritating and she is requesting excision. We discussed risk and benefits of local excision including risk of infection and bleeding and scarring and patient consents. Skin prepped with Betadine. Anesthesia 1% plain Xylocaine. Using #15 blade elliptical excision done. We were able to excise the base of the cyst. Minimal bleeding. Wound cavity irrigated with normal saline. Wound closed with 6 sutures of 4-0 Ethilon. Topical antibiotics and dressing applied. Follow-up promptly for signs of secondary infection. Return 7 days for suture removal #2 type 2 diabetes. History of good control. Recheck A1c #3 dysuria. Probable UTI. Check urine #4 right toe pain. Nonfocal exam. Observe for now.

## 2014-09-17 NOTE — Progress Notes (Signed)
Pre visit review using our clinic review tool, if applicable. No additional management support is needed unless otherwise documented below in the visit note. 

## 2014-09-20 LAB — URINE CULTURE: Colony Count: >=100000

## 2014-09-24 ENCOUNTER — Ambulatory Visit (INDEPENDENT_AMBULATORY_CARE_PROVIDER_SITE_OTHER): Payer: Medicare Other | Admitting: Family Medicine

## 2014-09-24 VITALS — BP 142/88 | HR 68 | Resp 12

## 2014-09-24 DIAGNOSIS — Z4802 Encounter for removal of sutures: Secondary | ICD-10-CM

## 2014-09-24 DIAGNOSIS — R600 Localized edema: Secondary | ICD-10-CM | POA: Diagnosis not present

## 2014-09-24 MED ORDER — FUROSEMIDE 40 MG PO TABS: 20.0000 mg | ORAL_TABLET | Freq: Every day | ORAL | Status: DC | PRN

## 2014-09-24 NOTE — Patient Instructions (Signed)

## 2014-09-24 NOTE — Progress Notes (Signed)
Subjective:    Patient ID: Dawn Salas, female    DOB: 08-16-1940, 74 y.o.   MRN: [id number]  HPI Patient here for the following issues  Suture removal right upper back. Recent sebaceous cyst excision. She has some itching which she thinks is related to Band-Aid but has had no problems with wound healing.  She has some mild bilateral leg edema which is worse late in the day. She elevates her legs occasionally. No compressions stockings. No increased dyspnea. No history of heart failure.  She is on low-dose amlodipine and has taken furosemide in the past for fluid retention and requesting refills of that. She also takes losartan HCTZ Previous echocardiogram 2013 EF 60-65%.  Past Medical History  Diagnosis Date  . Allergy   . Asthma   . Hyperlipidemia   . Hypertension   . Depression   . GERD (gastroesophageal reflux disease)   . Peptic ulcer disease   . Palpitations   . Multiple meningiomas of spine and brain   . Hyperglycemia   . Obstructive sleep apnea     STUDY AT [facility] DOES NOT USE C PAP  . Hyperthyroidism     PT HAD BIPOSY -NEGATIVE.Marland Kitchen AND NOW JUST GETS CHECKED EACH YEAR .Marland Kitchen 2013 LAST TEST   SEES DR. [name].   Marland Kitchen Arthritis   . Shortness of breath dyspnea     Has SOB with palpitations takes Metoprolol  . Pneumonia     "walking pneumonia" hx of  . Diabetes mellitus     Type two  . Seizures     has seizure after having second brain surgery  . Frequency of urination   . Diverticulitis   . History of hiatal hernia    Past Surgical History  Procedure Laterality Date  . Tonsillectomy    . Tubal ligation    . Right knee arthroscopy      x 3  . Right foot bunectomy    . Right shoulder rotator cuff    . L4-l5 posterior la    . C5-c6 neck fusion    . Left foot plantar and hammertoe    . Back surgery      L SPIN 2003   NECK 2004  . Dilation and curettage of uterus      YEARS AGO  . Craniotomy  08/13/2011    Procedure: CRANIOTOMY TUMOR EXCISION;  Surgeon: [name], MD;  Location: [facility];  Service: Neurosurgery;  Laterality: Bilateral;  Bifrontal craniotomy for tumor  . Abdominal hysterectomy    . Brain meningioma excision  8/10    X 2  . Wrist surgery Right   . Colonoscopy      reports that she has never smoked. She has never used smokeless tobacco. She reports that she does not drink alcohol or use illicit drugs. family history includes Anesthesia problems in her daughter; Arthritis in her other; Cancer in her mother; Diabetes in her other; Heart disease (age of onset: 49) in her father; Hyperlipidemia in her other; Hypertension in her other. Allergies  Allergen Reactions  . Flagyl [Metronidazole] Other (See Comments)    Caused increased heart rate  . Aspirin Effervescent Swelling    Alka-Seltzer gel caps- sore mouth, face swollen, turned hands white  . Morphine Sulfate     REACTION: hives  . Penicillins     REACTION: rash      Review of Systems  Constitutional: Negative for fatigue.  Eyes: Negative for visual disturbance.  Respiratory:  Negative for cough, chest tightness, shortness of breath and wheezing.   Cardiovascular: Positive for leg swelling. Negative for chest pain and palpitations.  Neurological: Negative for dizziness, seizures, syncope, weakness, light-headedness and headaches.       Objective:   Physical Exam  Constitutional: She appears well-developed and well-nourished.  Neck: No JVD present.  Cardiovascular: Normal rate and regular rhythm.  Exam reveals no gallop.   Pulmonary/Chest: Effort normal and breath sounds normal. No respiratory distress. She has no wheezes. She has no rales.  Musculoskeletal: She exhibits edema.  She has some mild nonpitting edema lower legs bilaterally. No upper extremity edema noted          Assessment & Plan:  #1 suture removal. She had sutures removed 6 and total right upper back without difficulty. Wound is healing well no signs of secondary infection. She appears to  have some mild irritation from where Band-Aid was. She will leave off at this time #2 mild bilateral leg edema. Suspect largely venous stasis related. She has history of normal renal function and no history of CHF. Refilled furosemide for as needed use. Recommend consider compression knee-high 15-20 mm with prescription written.

## 2014-11-13 ENCOUNTER — Other Ambulatory Visit: Payer: Self-pay | Admitting: Family Medicine

## 2014-11-23 ENCOUNTER — Ambulatory Visit: Payer: Medicare Other

## 2014-12-27 LAB — HM DIABETES EYE EXAM

## 2014-12-28 ENCOUNTER — Other Ambulatory Visit: Payer: Self-pay | Admitting: Family Medicine

## 2014-12-29 ENCOUNTER — Other Ambulatory Visit: Payer: Self-pay | Admitting: Family Medicine

## 2015-01-04 ENCOUNTER — Other Ambulatory Visit: Payer: Self-pay

## 2015-01-04 DIAGNOSIS — Z1231 Encounter for screening mammogram for malignant neoplasm of breast: Secondary | ICD-10-CM

## 2015-01-13 ENCOUNTER — Other Ambulatory Visit: Payer: Self-pay | Admitting: Family Medicine

## 2015-01-27 ENCOUNTER — Other Ambulatory Visit: Payer: Self-pay | Admitting: Family Medicine

## 2015-02-08 ENCOUNTER — Ambulatory Visit (INDEPENDENT_AMBULATORY_CARE_PROVIDER_SITE_OTHER): Payer: Medicare Other | Admitting: Internal Medicine

## 2015-02-11 ENCOUNTER — Ambulatory Visit: Payer: Medicare Other

## 2015-02-21 ENCOUNTER — Other Ambulatory Visit: Payer: Self-pay | Admitting: Family Medicine

## 2015-03-21 DIAGNOSIS — E042 Nontoxic multinodular goiter: Secondary | ICD-10-CM | POA: Insufficient documentation

## 2015-03-21 DIAGNOSIS — E890 Postprocedural hypothyroidism: Secondary | ICD-10-CM | POA: Insufficient documentation

## 2015-03-22 ENCOUNTER — Ambulatory Visit: Payer: Medicare Other

## 2015-03-31 ENCOUNTER — Other Ambulatory Visit: Payer: Self-pay | Admitting: General Practice

## 2015-03-31 MED ORDER — ACCU-CHEK SOFTCLIX LANCET DEV MISC: 1.0000 | Freq: Two times a day (BID) | Status: AC

## 2015-03-31 MED ORDER — GLUCOSE BLOOD VI STRP: 1.0000 | ORAL_STRIP | Freq: Two times a day (BID) | Status: DC

## 2015-04-04 ENCOUNTER — Ambulatory Visit (INDEPENDENT_AMBULATORY_CARE_PROVIDER_SITE_OTHER): Payer: Medicare Other | Admitting: Family Medicine

## 2015-04-04 DIAGNOSIS — E119 Type 2 diabetes mellitus without complications: Secondary | ICD-10-CM

## 2015-04-04 LAB — POCT GLYCOSYLATED HEMOGLOBIN (HGB A1C): Hemoglobin A1C: 6.2

## 2015-04-04 LAB — HM DIABETES FOOT EXAM: HM Diabetic Foot Exam: NORMAL

## 2015-04-04 NOTE — Progress Notes (Signed)
Pre visit review using our clinic review tool, if applicable. No additional management support is needed unless otherwise documented below in the visit note. 

## 2015-04-04 NOTE — Progress Notes (Signed)
Subjective:    Patient ID: Dawn Salas, female    DOB: 15-Jan-1941, 75 y.o.   MRN: [id number]  HPI  Patient seen for follow-up regarding type 2 diabetes.  History of good control. Last A1c 6.5%.  She is followed by endocrinology regarding hyperthyroidism and recently placed on methimazole. She's had some hot flushed fillings which she thinks are related to the hyperthyroidism.   Blood sugars have been very consistent by home readings with consistent readings between 70 and 100 fasting. No polyuria or polydipsia.  Past Medical History  Diagnosis Date  . Allergy   . Asthma   . Hyperlipidemia   . Hypertension   . Depression   . GERD (gastroesophageal reflux disease)   . Peptic ulcer disease   . Palpitations   . Multiple meningiomas of spine and brain   . Hyperglycemia   . Obstructive sleep apnea     STUDY AT [facility] DOES NOT USE C PAP  . Hyperthyroidism     PT HAD BIPOSY -NEGATIVE.Marland Kitchen AND NOW JUST GETS CHECKED EACH YEAR .Marland Kitchen 2013 LAST TEST   SEES DR. [name].   Marland Kitchen Arthritis   . Shortness of breath dyspnea     Has SOB with palpitations takes Metoprolol  . Pneumonia     "walking pneumonia" hx of  . Diabetes mellitus     Type two  . Seizures     has seizure after having second brain surgery  . Frequency of urination   . Diverticulitis   . History of hiatal hernia    Past Surgical History  Procedure Laterality Date  . Tonsillectomy    . Tubal ligation    . Right knee arthroscopy      x 3  . Right foot bunectomy    . Right shoulder rotator cuff    . L4-l5 posterior la    . C5-c6 neck fusion    . Left foot plantar and hammertoe    . Back surgery      L SPIN 2003   NECK 2004  . Dilation and curettage of uterus      YEARS AGO  . Craniotomy  08/13/2011    Procedure: CRANIOTOMY TUMOR EXCISION;  Surgeon: [name], MD;  Location: [facility];  Service: Neurosurgery;  Laterality: Bilateral;  Bifrontal craniotomy for tumor  . Abdominal hysterectomy    . Brain meningioma  excision  8/10    X 2  . Wrist surgery Right   . Colonoscopy      reports that she has never smoked. She has never used smokeless tobacco. She reports that she does not drink alcohol or use illicit drugs. family history includes Anesthesia problems in her daughter; Arthritis in her other; Cancer in her mother; Diabetes in her other; Heart disease (age of onset: 70) in her father; Hyperlipidemia in her other; Hypertension in her other. Allergies  Allergen Reactions  . Flagyl [Metronidazole] Other (See Comments)    Caused increased heart rate  . Aspirin Effervescent Swelling    Alka-Seltzer gel caps- sore mouth, face swollen, turned hands white  . Morphine Sulfate     REACTION: hives  . Penicillins     REACTION: rash      Review of Systems  Constitutional: Negative for fatigue and unexpected weight change.  Eyes: Negative for visual disturbance.  Respiratory: Negative for cough, chest tightness, shortness of breath and wheezing.   Cardiovascular: Negative for chest pain, palpitations and leg swelling.  Endocrine: Negative for polydipsia  and polyuria.  Genitourinary: Negative for dysuria.  Neurological: Negative for dizziness, seizures, syncope, weakness, light-headedness and headaches.       Objective:   Physical Exam  Constitutional: She appears well-developed and well-nourished.  Neck: Neck supple. No thyromegaly present.  Cardiovascular: Normal rate and regular rhythm.   Pulmonary/Chest: Effort normal and breath sounds normal. No respiratory distress. She has no wheezes. She has no rales.  Musculoskeletal: She exhibits no edema.          Assessment & Plan:   Type 2 diabetes. History of good control. Recheck A1c. If stable, continue every six-month assessment. Continue yearly eye exams

## 2015-04-06 ENCOUNTER — Other Ambulatory Visit: Payer: Self-pay | Admitting: Family Medicine

## 2015-04-08 ENCOUNTER — Ambulatory Visit
Admission: RE | Admit: 2015-04-08 | Discharge: 2015-04-08 | Disposition: A | Discharge status: Home / Self Care | Payer: Medicare Other | Source: Ambulatory Visit

## 2015-04-08 DIAGNOSIS — Z1231 Encounter for screening mammogram for malignant neoplasm of breast: Secondary | ICD-10-CM

## 2015-04-11 ENCOUNTER — Ambulatory Visit: Payer: Medicare Other | Attending: Neurosurgery | Admitting: Physical Therapy

## 2015-04-11 ENCOUNTER — Encounter: Payer: Self-pay | Admitting: Physical Therapy

## 2015-04-11 DIAGNOSIS — M545 Low back pain, unspecified: Secondary | ICD-10-CM

## 2015-04-11 DIAGNOSIS — R29898 Other symptoms and signs involving the musculoskeletal system: Secondary | ICD-10-CM | POA: Insufficient documentation

## 2015-04-11 DIAGNOSIS — R6889 Other general symptoms and signs: Secondary | ICD-10-CM | POA: Diagnosis present

## 2015-04-11 NOTE — Therapy (Signed)
[facility] [place], [place], [address] Phone: [phone]   Fax:  [phone]  Physical Therapy Evaluation  Patient Details  Name: Dawn Salas MRN: [id number] Date of Birth: 06-14-1940 Referring Provider: Newman Pies, MD  Encounter Date: 04/11/2015      PT End of Session - 04/11/15 1126    Visit Number 1   Number of Visits 16   Date for PT Re-Evaluation 06/06/15   PT Start Time [id number]   PT Stop Time 1221   PT Time Calculation (min) 55 min   Activity Tolerance Patient tolerated treatment well   Behavior During Therapy [facility] for tasks assessed/performed      Past Medical History  Diagnosis Date  . Allergy   . Asthma   . Hyperlipidemia   . Hypertension   . Depression   . GERD (gastroesophageal reflux disease)   . Peptic ulcer disease   . Palpitations   . Multiple meningiomas of spine and brain ([place])   . Hyperglycemia   . Obstructive sleep apnea     [facility] DOES NOT USE [facility] PAP  . Hyperthyroidism     PT HAD BIPOSY -NEGATIVE.Marland Kitchen AND NOW JUST GETS CHECKED EACH YEAR .Marland Kitchen 2013 LAST TEST   SEES DR. [name].   Marland Kitchen Arthritis   . Shortness of breath dyspnea     Has SOB with palpitations takes Metoprolol  . Pneumonia     "walking pneumonia" hx of  . Diabetes mellitus     Type two  . Seizures ([place])     has seizure after having second brain surgery  . Frequency of urination   . Diverticulitis   . History of hiatal hernia     Past Surgical History  Procedure Laterality Date  . Tonsillectomy    . Tubal ligation    . Right knee arthroscopy      x 3  . Right foot bunectomy    . Right shoulder rotator cuff    . L4-l5 posterior la    . C5-c6 neck fusion    . Left foot plantar and hammertoe    . Back surgery      L SPIN 2003   NECK 2004  . Dilation and curettage of uterus      YEARS AGO  . Craniotomy  08/13/2011    Procedure: CRANIOTOMY TUMOR EXCISION;  Surgeon: [name], MD;  Location: [facility];  Service: Neurosurgery;  Laterality: Bilateral;  Bifrontal craniotomy for tumor  . Abdominal hysterectomy    . Brain meningioma excision  8/10    X 2  . Wrist surgery Right   . Colonoscopy      There were no vitals filed for this visit.  Visit Diagnosis:  Left-sided low back pain without sciatica - Plan: PT plan of care cert/re-cert  Weakness of both lower limbs - Plan: PT plan of care cert/re-cert  Activity intolerance - Plan: PT plan of care cert/re-cert      Subjective Assessment - 04/11/15 1128    Subjective Patient had back surgery in 2015. This was her second fusion L3 to S1. She now has pain and cannot do household chores such as sweeping and vacuuming. She brings a cane when she goes up steps and also if she travels.. She states Dr. Arnoldo Morale says her knee surgeries have caused her to lean to the left.   Pertinent History lumbar fusions 2003/15; neck surgery 2003, brain surgery 2013, 3 arthroscopic knee surgeries  Limitations Walking;Standing   How long can you stand comfortably? one hour   How long can you walk comfortably? one hour   Patient Stated Goals less pain   Currently in Pain? Yes   Pain Score 3    Pain Location Back   Pain Orientation Left;Lower   Pain Descriptors / Indicators Pressure   Pain Type Chronic pain   Pain Onset More than a month ago   Pain Frequency Intermittent   Aggravating Factors  standing and walking   Pain Relieving Factors sitting   Effect of Pain on Daily Activities can't do ADLs, difficulty with stairs            [facility] PT Assessment - 04/11/15 0001    Assessment   Medical Diagnosis chronic LBP   Referring Provider Newman Pies, MD   Onset Date/Surgical Date 12/22/13   Next MD Visit 4 months    Precautions   Precaution Comments to be careful with sweeping and vacuuming   Balance Screen   Has the patient fallen in the past 6 months No   Has the patient had a decrease in activity level because of a fear of falling?  No    Is the patient reluctant to leave their home because of a fear of falling?  No   Home Ecologist residence   Living Arrangements Spouse/significant other   Home Access Stairs to enter   Entrance Stairs-Number of Steps 2   Entrance Stairs-Rails Can reach both   Home Layout Two level   Alternate Level Stairs-Number of Steps 13   Alternate Level Stairs-Rails Right   [place] - single point   Prior Function   Level of Independence Independent   Observation/Other Assessments   Focus on Therapeutic Outcomes (FOTO)  66% limited   Posture/Postural Control   Posture Comments stands in slight forward flexion, R knee flexed, wt shifted to LLE.   ROM / Strength   AROM / PROM / Strength Strength;AROM   AROM   Overall AROM Comments Lumbar flexion limited by tight HS B, ext limited 80%   Strength   Strength Assessment Site Hip;Knee   Right/Left Hip Right;Left   Right Hip Flexion 4/5   Right Hip Extension 3/5   Right Hip ABduction 3+/5   Left Hip Flexion 4-/5   Left Hip Extension 3/5   Left Hip ABduction 3+/5   Right/Left Knee Right;Left   Right Knee Flexion 4/5   Right Knee Extension 5/5   Left Knee Flexion 4-/5   Left Knee Extension 5/5   Flexibility   Soft Tissue Assessment /Muscle Length --  R piriformis, B HS, L gluteals   Palpation   Palpation comment tender at Left PSIS and surrounding tissues                   [facility] Adult PT Treatment/Exercise - 04/11/15 0001    Modalities   Modalities Electrical Stimulation   Electrical Stimulation   Electrical Stimulation Location L QL and PSIS   Electrical Stimulation Action premod   Electrical Stimulation Parameters 80-150 Hz to tolerance x 15 min   Electrical Stimulation Goals Pain                PT Education - 04/11/15 1215    Education provided Yes   Education Details HEp   Person(s) Educated Patient   Methods Explanation;Demonstration   Comprehension Returned  demonstration;Verbalized understanding;Need further instruction  PT Short Term Goals - 2015-04-18 1226    PT SHORT TERM GOAL #1   Title I with HEP   Time 2   Period Weeks   Status New           PT Long Term Goals - April 18, 2015 1230    PT LONG TERM GOAL #1   Title decreased pain in back by 50% or better with ADLS including sweeping and vacuuming.   Time 8   Period Weeks   Status New   PT LONG TERM GOAL #2   Title patient able to ascend/descend stairs without AD.   Time 8   Period Weeks   Status New   PT LONG TERM GOAL #3   Title improved B hip and knee strength to 4+/5 to improve function.   Time 8   Period Weeks   Status New   PT LONG TERM GOAL #4   Title Patient able to turn in bed without having to sit up first.   Time 8   Period Weeks   Status New               Plan - April 18, 2015 1216    Clinical Impression Statement Patient presents with ongoing left lower back pain limiting ADLS. She has decreased R knee ROM resulting in increased weightbearing on L side resulting in L QL tightness and L SIJ pain. She has marked weakness in B hips as well as tightnes and subsequently uses a cane for stairs    Pt will benefit from skilled therapeutic intervention in order to improve on the following deficits Decreased range of motion;Decreased activity tolerance;Pain;Impaired flexibility;Decreased strength;Postural dysfunction   Rehab Potential Good   PT Frequency 2x / week   PT Duration 8 weeks   PT Treatment/Interventions ADLs/Self Care Home Management;Electrical Stimulation;Moist Heat;Ultrasound;Stair training;Therapeutic exercise;Manual techniques;Patient/family education;Neuromuscular re-education;Passive range of motion;Dry needling   PT Next Visit Plan Hip strengthening (bridge, hip ABD, steps) and flexibility, possible [place] to L QL and SIJ with STW; ADL modification for vacuuming/sweeping.    PT Home Exercise Plan HS, piriformis and SKTC stretches   Consulted and  Agree with Plan of Care Patient          G-Codes - April 18, 2015 1237    Functional Limitation Mobility: Walking and moving around   Mobility: Walking and Moving Around Current Status [phone]) At least 60 percent but less than 80 percent impaired, limited or restricted   Mobility: Walking and Moving Around Goal Status [phone]) At least 40 percent but less than 60 percent impaired, limited or restricted       Problem List Patient Active Problem List   Diagnosis Date Noted  . Spondylolisthesis of lumbar region 12/23/2013  . 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 Hyperthyroidism 05/25/2013  . Obesity (BMI 30-39.9) 12/30/2012  . 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[place]) 08/21/2011  . Meningioma ([place]) 08/13/2011  . DIZZINESS 11/02/2009  . Type 2 diabetes mellitus, controlled ([place]) 05/20/2009  . HYPOKALEMIA 05/02/2009  . DEPRESSION 06/24/2008  . GERD 06/24/2008  . PEPTIC ULCER DISEASE 06/24/2008  . TMJ SYNDROME 06/09/2008  . Hyperlipemia 11/08/2006  . OBSTRUCTIVE SLEEP APNEA 11/08/2006  . Essential hypertension 11/08/2006  . ALLERGIC RHINITIS 11/08/2006  . VOCAL CORD DISORDER 11/08/2006  . ASTHMA 11/08/2006  . TACHYARRHYTHMIA 11/08/2006   Madelyn Flavors PT  04/18/15, 12:42 PM  [facility] [address] [place], [place], [address] Phone: [phone]   Fax:  [phone]  Name: Dawn Salas MRN: [id number] Date of  Birth: 12/16/1940

## 2015-04-15 ENCOUNTER — Ambulatory Visit: Payer: Medicare Other | Admitting: Physical Therapy

## 2015-04-15 DIAGNOSIS — M545 Low back pain, unspecified: Secondary | ICD-10-CM

## 2015-04-15 DIAGNOSIS — R6889 Other general symptoms and signs: Secondary | ICD-10-CM

## 2015-04-15 DIAGNOSIS — R29898 Other symptoms and signs involving the musculoskeletal system: Secondary | ICD-10-CM

## 2015-04-15 NOTE — Therapy (Signed)
PT LONG TERM GOAL #3   Title improved B hip and knee strength to 4+/5 to improve function.   Time 8   Period Weeks   Status New   PT LONG TERM GOAL #4   Title Patient able to turn in bed without having to sit up first.   Time 8   Period Weeks   Status New               Problem List Patient Active Problem List   Diagnosis Date Noted  . Spondylolisthesis of lumbar region 12/23/2013  . 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 Hyperthyroidism 05/25/2013  . Obesity (BMI 30-39.9) 12/30/2012  . 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[place]) 08/21/2011  . Meningioma ([place]) 08/13/2011  . DIZZINESS 11/02/2009  . Type 2 diabetes mellitus, controlled ([place]) 05/20/2009  . HYPOKALEMIA 05/02/2009  . DEPRESSION 06/24/2008  . GERD 06/24/2008  . PEPTIC ULCER DISEASE 06/24/2008  . TMJ SYNDROME 06/09/2008  . Hyperlipemia 11/08/2006  . OBSTRUCTIVE SLEEP APNEA 11/08/2006  . Essential hypertension 11/08/2006  . ALLERGIC RHINITIS 11/08/2006  . VOCAL CORD DISORDER 11/08/2006  . ASTHMA 11/08/2006  . TACHYARRHYTHMIA 11/08/2006  Treatment:  HMP and e'stim to patient left low back/SIJ region f/b U/S @ 1.50 W/CM2 x 12 minutes (right SDLY position with pillow between knees for comfort) f/b STW/M x 23  minutes.  Patient felt better after treatment.  Elaya Droege, [place] MPT 04/15/2015, 1:05 PM  [facility] [address] [place], [place], [address] Phone: [phone]   Fax:  [phone]  Name: Dawn Salas MRN: [id number] Date of Birth: [birth year]  [facility] [place], [place], [address] Phone: [phone]   Fax:  [phone]  Physical Therapy Treatment  Patient Details  Name: Dawn Salas MRN: [id number] Date of Birth: 02/25/1940 Referring Provider: Newman Pies, MD  Encounter Date: 04/15/2015      PT End of Session - 04/15/15 1301    Activity Tolerance Patient tolerated treatment well   Behavior During Therapy [facility] for tasks assessed/performed      Past Medical History  Diagnosis Date  . Allergy   . Asthma   . Hyperlipidemia   . Hypertension   . Depression   . GERD (gastroesophageal reflux disease)   . Peptic ulcer disease   . Palpitations   . Multiple meningiomas of spine and brain ([place])   . Hyperglycemia   . Obstructive sleep apnea     [facility]UDY [facility] DOES NOT USE C PAP  . Hyperthyroidism     PT HAD BIPOSY -NEGATIVE.Marland Kitchen AND NOW JUST GETS CHECKED EACH YEAR .Marland Kitchen 2013 LAST TEST   SEES DR. [name].   Marland Kitchen Arthritis   . Shortness of breath dyspnea     Has SOB with palpitations takes Metoprolol  . Pneumonia     "walking pneumonia" hx of  . Diabetes mellitus     Type two  . Seizures ([place])     has seizure after having second brain surgery  . Frequency of urination   . Diverticulitis   . History of hiatal hernia     Past Surgical History  Procedure Laterality Date  . Tonsillectomy    . Tubal ligation    . Right knee arthroscopy      x 3  . Right foot bunectomy    . Right shoulder rotator cuff    . L4-l5 posterior la    . C5-c6 neck fusion    . Left foot plantar and hammertoe    . Back surgery      L SPIN 2003   NECK 2004  . Dilation and curettage of uterus      YEARS AGO  . Craniotomy  08/13/2011    Procedure: CRANIOTOMY TUMOR EXCISION;  Surgeon: [name], MD;  Location: [facility];  Service: Neurosurgery;  Laterality: Bilateral;  Bifrontal craniotomy for tumor  . Abdominal hysterectomy    . Brain meningioma excision  8/10    X 2  .  Wrist surgery Right   . Colonoscopy      There were no vitals filed for this visit.  Visit Diagnosis:  Left-sided low back pain without sciatica  Weakness of both lower limbs  Activity intolerance                                 PT Short Term Goals - 04/11/15 1226    PT SHORT TERM GOAL #1   Title I with HEP   Time 2   Period Weeks   Status New           PT Long Term Goals - 04/11/15 1230    PT LONG TERM GOAL #1   Title decreased pain in back by 50% or better with ADLS including sweeping and vacuuming.   Time 8   Period Weeks   Status New   PT LONG TERM GOAL #2   Title patient able to ascend/descend stairs without AD.   Time 8   Period Weeks   Status New

## 2015-04-18 ENCOUNTER — Encounter: Payer: Self-pay | Admitting: Physical Therapy

## 2015-04-18 ENCOUNTER — Ambulatory Visit: Payer: Medicare Other | Admitting: Physical Therapy

## 2015-04-18 DIAGNOSIS — M545 Low back pain, unspecified: Secondary | ICD-10-CM

## 2015-04-18 DIAGNOSIS — R29898 Other symptoms and signs involving the musculoskeletal system: Secondary | ICD-10-CM

## 2015-04-18 DIAGNOSIS — R6889 Other general symptoms and signs: Secondary | ICD-10-CM

## 2015-04-18 NOTE — Therapy (Signed)
[facility] [address] [place], [place], [address] Phone: [phone]   Fax:  [phone]  Physical Therapy Treatment  Patient Details  Name: Dawn Salas MRN: [id number] Date of Birth: [birth year] Referring Provider: Tressie Stalker, MD  Encounter Date: 04/18/2015      PT End of Session - 04/18/15 1400    Visit Number 3   Number of Visits 16   Date for PT Re-Evaluation 06/06/15   PT Start Time 1328   PT Stop Time 1412   PT Time Calculation (min) 44 min   Activity Tolerance Patient tolerated treatment well   Behavior During Therapy [facility] for tasks assessed/performed      Past Medical History  Diagnosis Date  . Allergy   . Asthma   . Hyperlipidemia   . Hypertension   . Depression   . GERD (gastroesophageal reflux disease)   . Peptic ulcer disease   . Palpitations   . 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 Hyperglycemia   . Obstructive sleep apnea     STUDY AT [facility] DOES NOT USE C PAP  . Hyperthyroidism     PT HAD BIPOSY -NEGATIVE.Marland Kitchen AND NOW JUST GETS CHECKED EACH YEAR .Marland Kitchen 2013 LAST TEST   SEES DR. [name].   Marland Kitchen Arthritis   . Shortness of breath dyspnea     Has SOB with palpitations takes Metoprolol  . Pneumonia     "walking pneumonia" hx of  . Diabetes mellitus     Type two  . Seizures ([facility])     has seizure after having second brain surgery  . Frequency of urination   . Diverticulitis   . History of hiatal hernia     Past Surgical History  Procedure Laterality Date  . Tonsillectomy    . Tubal ligation    . Right knee arthroscopy      x 3  . Right foot bunectomy    . Right shoulder rotator cuff    . L4-l5 posterior la    . C5-c6 neck fusion    . Left foot plantar and hammertoe    . Back surgery      L SPIN 2003   NECK 2004  . Dilation and curettage of uterus      YEARS AGO  . Craniotomy  08/13/2011    Procedure: CRANIOTOMY TUMOR EXCISION;  Surgeon: Cristi Loron, MD;  Location: [facility];   Service: Neurosurgery;  Laterality: Bilateral;  Bifrontal craniotomy for tumor  . Abdominal hysterectomy    . Brain meningioma excision  8/10    X 2  . Wrist surgery Right   . Colonoscopy      There were no vitals filed for this visit.  Visit Diagnosis:  Left-sided low back pain without sciatica  Weakness of both lower limbs  Activity intolerance      Subjective Assessment - 04/18/15 1330    Subjective Patient has some soreness with ADL's   Pertinent History lumbar fusions 2003/15; neck surgery 2003, brain surgery 2013, 3 arthroscopic knee surgeries   Limitations Walking;Standing   How long can you stand comfortably? one hour   How long can you walk comfortably? one hour   Patient Stated Goals less pain   Currently in Pain? Yes   Pain Score 3    Pain Location Back   Pain Orientation Left;Lower   Pain Descriptors / Indicators Sore;Pressure   Pain Type Chronic pain   Pain Onset More than a  month ago   Pain Frequency Intermittent   Aggravating Factors  weight bearning   Pain Relieving Factors rest                         [facility] Adult PT Treatment/Exercise - 04/18/15 0001    Self-Care   Self-Care Lifting;ADL's;Posture   ADL's sweeping, mopping, vacuum, laundry, beushing teeth   Lifting heavy or light objects and position    Posture sleeping, sitting, standing and other work station activity   Exercises   Exercises Lumbar   Lumbar Exercises: Supine   Ab Set --  10 sec holds x fatugue, cues for draw in abdominals and glut   Modalities   Modalities Electrical Stimulation;Moist Heat;Ultrasound   Moist Heat Therapy   Number Minutes Moist Heat 15 Minutes   Moist Heat Location Lumbar Spine   Electrical Stimulation   Electrical Stimulation Location left QL and PSIS   Electrical Stimulation Action premod   Electrical Stimulation Parameters 80-150hz    Electrical Stimulation Goals Pain   Ultrasound   Ultrasound Location Left low back QL area   Ultrasound  Parameters 1.5w/cm2/50%/72mhz x53min   Ultrasound Goals Pain   Manual Therapy   Manual Therapy Soft tissue mobilization   Soft tissue mobilization manual STW to left QL area of pain and lumbar paraspinals                PT Education - 04/18/15 1335    Education provided Yes   Education Details HEP ADL's Vivianne Master techniques   Person(s) Educated Patient   Methods Explanation;Demonstration;Handout   Comprehension Verbalized understanding;Returned demonstration          PT Short Term Goals - 04/18/15 1402    PT SHORT TERM GOAL #1   Title I with HEP   Time 2   Period Weeks   Status Achieved  Patient independent and has good understanding of HEP's 04/18/15           PT Long Term Goals - 04/18/15 1403    PT LONG TERM GOAL #1   Title decreased pain in back by 50% or better with ADLS including sweeping and vacuuming.   Time 8   Period Weeks   Status On-going   PT LONG TERM GOAL #2   Title patient able to ascend/descend stairs without AD.   Time 8   Period Weeks   Status On-going   PT LONG TERM GOAL #3   Title improved B hip and knee strength to 4+/5 to improve function.   Time 8   Period Weeks   Status On-going   PT LONG TERM GOAL #4   Title Patient able to turn in bed without having to sit up first.   Time 8   Period Weeks   Status On-going               Plan - 04/18/15 1403    Clinical Impression Statement Patient progressing with good response to therapy thus far. Patient was given HEP and educated with posture awareness techniques in all positions, ADL's, lifting and work/volunteer stations. Patient is independent with good understanding of technique to avoid reinury or pain. Patient has met STG#1 and others ongoing due to pain deficits. Patient started core activation today with holds and relax with no difficulty.    Pt will benefit from skilled therapeutic intervention in order to improve on the following deficits Decreased range of motion;Decreased  activity tolerance;Pain;Impaired flexibility;Decreased strength;Postural dysfunction   Rehab Potential  Good   PT Frequency 2x / week   PT Duration 8 weeks   PT Treatment/Interventions ADLs/Self Care Home Management;Electrical Stimulation;Moist Heat;Ultrasound;Stair training;Therapeutic exercise;Manual techniques;Patient/family education;Neuromuscular re-education;Passive range of motion;Dry needling   PT Next Visit Plan cont with modalities as needed and Hip strengthening (bridge, hip ABD, steps) and flexibility per MPT        Problem List Patient Active Problem List   Diagnosis Date Noted  . Spondylolisthesis of lumbar region 12/23/2013  . 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 Hyperthyroidism 05/25/2013  . Obesity (BMI 30-39.9) 12/30/2012  . 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[facility]) 08/21/2011  . Meningioma ([facility]) 08/13/2011  . DIZZINESS 11/02/2009  . Type 2 diabetes mellitus, controlled ([facility]) 05/20/2009  . HYPOKALEMIA 05/02/2009  . DEPRESSION 06/24/2008  . GERD 06/24/2008  . PEPTIC ULCER DISEASE 06/24/2008  . TMJ SYNDROME 06/09/2008  . Hyperlipemia 11/08/2006  . OBSTRUCTIVE SLEEP APNEA 11/08/2006  . Essential hypertension 11/08/2006  . ALLERGIC RHINITIS 11/08/2006  . VOCAL CORD DISORDER 11/08/2006  . ASTHMA 11/08/2006  . TACHYARRHYTHMIA 11/08/2006    Roda Lauture P, PTA 04/18/2015, 2:13 PM  [facility] [address] [place], [place], [address] Phone: [phone]   Fax:  [phone]  Name: Dawn Salas MRN: [id number] Date of Birth: 1940/06/04

## 2015-04-18 NOTE — Patient Instructions (Signed)

## 2015-04-19 ENCOUNTER — Ambulatory Visit: Payer: Medicare Other | Admitting: *Deleted

## 2015-04-19 DIAGNOSIS — R29898 Other symptoms and signs involving the musculoskeletal system: Secondary | ICD-10-CM

## 2015-04-19 DIAGNOSIS — M545 Low back pain, unspecified: Secondary | ICD-10-CM

## 2015-04-19 DIAGNOSIS — R6889 Other general symptoms and signs: Secondary | ICD-10-CM

## 2015-04-19 NOTE — Therapy (Signed)
[facility] [place], [place], [address] Phone: [phone]   Fax:  [phone]  Physical Therapy Treatment  Patient Details  Name: Dawn Salas MRN: [id number] Date of Birth: 07-26-1940 Referring Provider: Newman Pies, MD  Encounter Date: 04/19/2015      PT End of Session - 04/19/15 1214    Visit Number 4   Date for PT Re-Evaluation 06/06/15   PT Start Time 1115   PT Stop Time 1205   PT Time Calculation (min) 50 min      Past Medical History  Diagnosis Date  . Allergy   . Asthma   . Hyperlipidemia   . Hypertension   . Depression   . GERD (gastroesophageal reflux disease)   . Peptic ulcer disease   . Palpitations   . Multiple meningiomas of spine and brain ([place])   . Hyperglycemia   . Obstructive sleep apnea     STUDY AT [facility] DOES NOT USE C PAP  . Hyperthyroidism     PT HAD BIPOSY -NEGATIVE.Marland Kitchen AND NOW JUST GETS CHECKED EACH YEAR .Marland Kitchen 2013 LAST TEST   SEES DR. [name].   Marland Kitchen Arthritis   . Shortness of breath dyspnea     Has SOB with palpitations takes Metoprolol  . Pneumonia     "walking pneumonia" hx of  . Diabetes mellitus     Type two  . Seizures ([place])     has seizure after having second brain surgery  . Frequency of urination   . Diverticulitis   . History of hiatal hernia     Past Surgical History  Procedure Laterality Date  . Tonsillectomy    . Tubal ligation    . Right knee arthroscopy      x 3  . Right foot bunectomy    . Right shoulder rotator cuff    . L4-l5 posterior la    . C5-c6 neck fusion    . Left foot plantar and hammertoe    . Back surgery      L SPIN 2003   NECK 2004  . Dilation and curettage of uterus      YEARS AGO  . Craniotomy  08/13/2011    Procedure: CRANIOTOMY TUMOR EXCISION;  Surgeon: [name], MD;  Location: [facility];  Service: Neurosurgery;  Laterality: Bilateral;  Bifrontal craniotomy for tumor  . Abdominal hysterectomy    . Brain meningioma  excision  8/10    X 2  . Wrist surgery Right   . Colonoscopy      There were no vitals filed for this visit.  Visit Diagnosis:  Left-sided low back pain without sciatica  Weakness of both lower limbs  Activity intolerance      Subjective Assessment - 04/18/15 1330    Subjective Patient has some soreness with ADL's   Pertinent History lumbar fusions 2003/15; neck surgery 2003, brain surgery 2013, 3 arthroscopic knee surgeries   Limitations Walking;Standing   How long can you stand comfortably? one hour   How long can you walk comfortably? one hour   Patient Stated Goals less pain   Currently in Pain? Yes   Pain Score 3    Pain Location Back   Pain Orientation Left;Lower   Pain Descriptors / Indicators Sore;Pressure   Pain Type Chronic pain   Pain Onset More than a month ago   Pain Frequency Intermittent   Aggravating Factors  weight bearning   Pain Relieving Factors rest  [facility] Adult PT Treatment/Exercise - 04/19/15 0001    Modalities   Modalities Electrical Stimulation;Moist Heat;Ultrasound   Moist Heat Therapy   Number Minutes Moist Heat 15 Minutes   Moist Heat Location Lumbar Spine   Electrical Stimulation   Electrical Stimulation Location left QL and PSIS   Electrical Stimulation Action Premod   Electrical Stimulation Parameters 80-150 hz   Electrical Stimulation Goals Pain   Ultrasound   Ultrasound Location LT LB paras and QL   Ultrasound Parameters 1.5 w/cm2 x 10 mins in RT sidelying   Ultrasound Goals Pain   Manual Therapy   Manual Therapy Soft tissue mobilization   Soft tissue mobilization manual STW to left QL area of pain and lumbar paraspinals                PT Education - 04/18/15 1335    Education provided Yes   Education Details HEP ADL's [place] techniques   Person(s) Educated Patient   Methods Explanation;Demonstration;Handout   Comprehension Verbalized understanding;Returned demonstration           PT Short Term Goals - 04/18/15 1402    PT SHORT TERM GOAL #1   Title I with HEP   Time 2   Period Weeks   Status Achieved  Patient independent and has good understanding of HEP's 04/18/15           PT Long Term Goals - 04/18/15 1403    PT LONG TERM GOAL #1   Title decreased pain in back by 50% or better with ADLS including sweeping and vacuuming.   Time 8   Period Weeks   Status On-going   PT LONG TERM GOAL #2   Title patient able to ascend/descend stairs without AD.   Time 8   Period Weeks   Status On-going   PT LONG TERM GOAL #3   Title improved B hip and knee strength to 4+/5 to improve function.   Time 8   Period Weeks   Status On-going   PT LONG TERM GOAL #4   Title Patient able to turn in bed without having to sit up first.   Time 8   Period Weeks   Status On-going               Plan - 04/19/15 1216    Clinical Impression Statement Pt did great with Rx today. Her pain is getting lower and is able to do more around the house.  Notable tension in LT LB paras and QL, but had some release after [place] and STW. Goals are Ongoing   Pt will benefit from skilled therapeutic intervention in order to improve on the following deficits Decreased range of motion;Decreased activity tolerance;Pain;Impaired flexibility;Decreased strength;Postural dysfunction   Rehab Potential Good   PT Frequency 2x / week   PT Duration 8 weeks   PT Treatment/Interventions ADLs/Self Care Home Management;Electrical Stimulation;Moist Heat;Ultrasound;Stair training;Therapeutic exercise;Manual techniques;Patient/family education;Neuromuscular re-education;Passive range of motion;Dry needling   PT Next Visit Plan cont with modalities as needed and Hip strengthening (bridge, hip ABD, steps) and flexibility per MPT   PT Home Exercise Plan HS, piriformis and SKTC stretches   Consulted and Agree with Plan of Care Patient        Problem List Patient Active Problem List   Diagnosis Date  Noted  . Spondylolisthesis of lumbar region 12/23/2013  . 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 Hyperthyroidism 05/25/2013  . Obesity (BMI 30-39.9) 12/30/2012  . 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[place]) 08/21/2011  . Meningioma ([place])  08/13/2011  . DIZZINESS 11/02/2009  . Type 2 diabetes mellitus, controlled ([place]) 05/20/2009  . HYPOKALEMIA 05/02/2009  . DEPRESSION 06/24/2008  . GERD 06/24/2008  . PEPTIC ULCER DISEASE 06/24/2008  . TMJ SYNDROME 06/09/2008  . Hyperlipemia 11/08/2006  . OBSTRUCTIVE SLEEP APNEA 11/08/2006  . Essential hypertension 11/08/2006  . ALLERGIC RHINITIS 11/08/2006  . VOCAL CORD DISORDER 11/08/2006  . ASTHMA 11/08/2006  . TACHYARRHYTHMIA 11/08/2006    RAMSEUR,CHRIS, PTA 04/19/2015, 12:20 PM  [facility] [address] [place], [place], [address] Phone: [phone]   Fax:  [phone]  Name: Dawn Salas MRN: [id number] Date of Birth: 1940/11/18

## 2015-04-25 ENCOUNTER — Encounter: Payer: Medicare Other | Admitting: Physical Therapy

## 2015-04-28 ENCOUNTER — Encounter: Payer: Self-pay | Admitting: Physical Therapy

## 2015-04-28 ENCOUNTER — Ambulatory Visit: Payer: Medicare Other | Attending: Neurosurgery | Admitting: Physical Therapy

## 2015-04-28 DIAGNOSIS — M6281 Muscle weakness (generalized): Secondary | ICD-10-CM | POA: Insufficient documentation

## 2015-04-28 DIAGNOSIS — R6889 Other general symptoms and signs: Secondary | ICD-10-CM | POA: Insufficient documentation

## 2015-04-28 DIAGNOSIS — M545 Low back pain, unspecified: Secondary | ICD-10-CM

## 2015-04-28 DIAGNOSIS — R29898 Other symptoms and signs involving the musculoskeletal system: Secondary | ICD-10-CM | POA: Diagnosis present

## 2015-04-28 NOTE — Therapy (Signed)
[facility] [address] [place], [place], [address] Phone: [phone]   Fax:  [phone]  Physical Therapy Treatment  Patient Details  Name: Dawn Salas MRN: [id number] Date of Birth: [birth year] Referring Provider: Tressie Stalker, MD  Encounter Date: 04/28/2015      PT End of Session - 04/28/15 1319    Visit Number 5   Number of Visits 16   Date for PT Re-Evaluation 06/06/15   PT Start Time 1311   PT Stop Time 1352   PT Time Calculation (min) 41 min   Activity Tolerance Patient tolerated treatment well   Behavior During Therapy [facility] for tasks assessed/performed      Past Medical History  Diagnosis Date  . Allergy   . Asthma   . Hyperlipidemia   . Hypertension   . Depression   . GERD (gastroesophageal reflux disease)   . Peptic ulcer disease   . Palpitations   . 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 Hyperglycemia   . Obstructive sleep apnea     STUDY AT [facility] DOES NOT USE C PAP  . Hyperthyroidism     PT HAD BIPOSY -NEGATIVE.Marland Kitchen AND NOW JUST GETS CHECKED EACH YEAR .Marland Kitchen 2013 LAST TEST   SEES DR. [name].   Marland Kitchen Arthritis   . Shortness of breath dyspnea     Has SOB with palpitations takes Metoprolol  . Pneumonia     "walking pneumonia" hx of  . Diabetes mellitus     Type two  . Seizures ([facility])     has seizure after having second brain surgery  . Frequency of urination   . Diverticulitis   . History of hiatal hernia     Past Surgical History  Procedure Laterality Date  . Tonsillectomy    . Tubal ligation    . Right knee arthroscopy      x 3  . Right foot bunectomy    . Right shoulder rotator cuff    . L4-l5 posterior la    . C5-c6 neck fusion    . Left foot plantar and hammertoe    . Back surgery      L SPIN 2003   NECK 2004  . Dilation and curettage of uterus      YEARS AGO  . Craniotomy  08/13/2011    Procedure: CRANIOTOMY TUMOR EXCISION;  Surgeon: Cristi Loron, MD;  Location: [facility];   Service: Neurosurgery;  Laterality: Bilateral;  Bifrontal craniotomy for tumor  . Abdominal hysterectomy    . Brain meningioma excision  8/10    X 2  . Wrist surgery Right   . Colonoscopy      There were no vitals filed for this visit.  Visit Diagnosis:  Left-sided low back pain without sciatica  Weakness of both lower limbs      Subjective Assessment - 04/28/15 1314    Subjective Patient has soreness in back esp with walking or prolong standing   Pertinent History lumbar fusions 2003/15; neck surgery 2003, brain surgery 2013, 3 arthroscopic knee surgeries   Limitations Walking;Standing   How long can you stand comfortably? one hour   How long can you walk comfortably? one hour   Patient Stated Goals less pain   Currently in Pain? Yes   Pain Score 3    Pain Location Back   Pain Orientation Left;Lower   Pain Descriptors / Indicators Sore   Pain Type Chronic pain   Pain Onset More  than a month ago   Aggravating Factors  prolong standing or walking   Pain Relieving Factors at rest                         [facility] Adult PT Treatment/Exercise - 04/28/15 0001    Lumbar Exercises: Stretches   Active Hamstring Stretch 3 reps;30 seconds   Single Knee to Chest Stretch 30 seconds;3 reps  bil   Piriformis Stretch 30 seconds;3 reps   Lumbar Exercises: Supine   Bridge 3 seconds  2x10   Lumbar Exercises: Sidelying   Hip Abduction 2 seconds  2x10   Moist Heat Therapy   Number Minutes Moist Heat 15 Minutes   Moist Heat Location Lumbar Spine   Electrical Stimulation   Electrical Stimulation Location left QL and PSIS   Electrical Stimulation Action premod   Electrical Stimulation Parameters 80-150hz    Electrical Stimulation Goals Pain   Ultrasound   Ultrasound Location left lowback paraspinals   Ultrasound Parameters 1.5w/cm2/50%/23mhz x71min   Ultrasound Goals Pain                  PT Short Term Goals - 04/18/15 1402    PT SHORT TERM GOAL #1   Title  I with HEP   Time 2   Period Weeks   Status Achieved  Patient independent and has good understanding of HEP's 04/18/15           PT Long Term Goals - 04/18/15 1403    PT LONG TERM GOAL #1   Title decreased pain in back by 50% or better with ADLS including sweeping and vacuuming.   Time 8   Period Weeks   Status On-going   PT LONG TERM GOAL #2   Title patient able to ascend/descend stairs without AD.   Time 8   Period Weeks   Status On-going   PT LONG TERM GOAL #3   Title improved B hip and knee strength to 4+/5 to improve function.   Time 8   Period Weeks   Status On-going   PT LONG TERM GOAL #4   Title Patient able to turn in bed without having to sit up first.   Time 8   Period Weeks   Status On-going               Plan - 04/28/15 1337    Clinical Impression Statement Patient tolerated treatment well today and added hip bridges and hip abd strengthening with review of stretches. Patient feels improvement overall yet continues to have pain with prolong walking or standing and pain with palpation. Goals ongoing due to pain deficits. No antalgiv gait today.   Pt will benefit from skilled therapeutic intervention in order to improve on the following deficits Decreased range of motion;Decreased activity tolerance;Pain;Impaired flexibility;Decreased strength;Postural dysfunction   Rehab Potential Good   PT Frequency 2x / week   PT Duration 8 weeks   PT Treatment/Interventions ADLs/Self Care Home Management;Electrical Stimulation;Moist Heat;Ultrasound;Stair training;Therapeutic exercise;Manual techniques;Patient/family education;Neuromuscular re-education;Passive range of motion;Dry needling   PT Next Visit Plan cont with modalities as needed and Hip strengthening (bridge, hip ABD, steps) and flexibility per MPT   Consulted and Agree with Plan of Care Patient        Problem List Patient Active Problem List   Diagnosis Date Noted  . Spondylolisthesis of lumbar  region 12/23/2013  . 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 Hyperthyroidism 05/25/2013  . Obesity (BMI 30-39.9) 12/30/2012  .  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[facility]) 08/21/2011  . Meningioma ([facility]) 08/13/2011  . DIZZINESS 11/02/2009  . Type 2 diabetes mellitus, controlled ([facility]) 05/20/2009  . HYPOKALEMIA 05/02/2009  . DEPRESSION 06/24/2008  . GERD 06/24/2008  . PEPTIC ULCER DISEASE 06/24/2008  . TMJ SYNDROME 06/09/2008  . Hyperlipemia 11/08/2006  . OBSTRUCTIVE SLEEP APNEA 11/08/2006  . Essential hypertension 11/08/2006  . ALLERGIC RHINITIS 11/08/2006  . VOCAL CORD DISORDER 11/08/2006  . ASTHMA 11/08/2006  . TACHYARRHYTHMIA 11/08/2006    Celestino Ackerman P, PTA 04/28/2015, 1:52 PM  [facility] [address] [place], [place], [address] Phone: [phone]   Fax:  [phone]  Name: Dawn Salas MRN: [id number] Date of Birth: 16-Apr-1940

## 2015-05-02 ENCOUNTER — Encounter: Payer: Self-pay | Admitting: Physical Therapy

## 2015-05-02 ENCOUNTER — Ambulatory Visit: Payer: Medicare Other | Admitting: Physical Therapy

## 2015-05-02 DIAGNOSIS — M545 Low back pain, unspecified: Secondary | ICD-10-CM

## 2015-05-02 DIAGNOSIS — R29898 Other symptoms and signs involving the musculoskeletal system: Secondary | ICD-10-CM

## 2015-05-02 DIAGNOSIS — R6889 Other general symptoms and signs: Secondary | ICD-10-CM

## 2015-05-02 NOTE — Therapy (Signed)
[facility] [address] [place], [place], [address] Phone: [phone]   Fax:  [phone]  Physical Therapy Treatment  Patient Details  Name: Dawn Salas MRN: [id number] Date of Birth: 1940-07-30 Referring Provider: Tressie Stalker, MD  Encounter Date: 05/02/2015      PT End of Session - 05/02/15 1414    Visit Number 6   Number of Visits 16   Date for PT Re-Evaluation 06/06/15   PT Start Time 1403   PT Stop Time 1444   PT Time Calculation (min) 41 min   Activity Tolerance Patient tolerated treatment well   Behavior During Therapy [facility] for tasks assessed/performed      Past Medical History  Diagnosis Date  . Allergy   . Asthma   . Hyperlipidemia   . Hypertension   . Depression   . GERD (gastroesophageal reflux disease)   . Peptic ulcer disease   . Palpitations   . 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 Hyperglycemia   . Obstructive sleep apnea     STUDY AT [facility] DOES NOT USE C PAP  . Hyperthyroidism     PT HAD BIPOSY -NEGATIVE.Marland Kitchen AND NOW JUST GETS CHECKED EACH YEAR .Marland Kitchen 2013 LAST TEST   SEES DR. [name].   Marland Kitchen Arthritis   . Shortness of breath dyspnea     Has SOB with palpitations takes Metoprolol  . Pneumonia     "walking pneumonia" hx of  . Diabetes mellitus     Type two  . Seizures ([facility])     has seizure after having second brain surgery  . Frequency of urination   . Diverticulitis   . History of hiatal hernia     Past Surgical History  Procedure Laterality Date  . Tonsillectomy    . Tubal ligation    . Right knee arthroscopy      x 3  . Right foot bunectomy    . Right shoulder rotator cuff    . L4-l5 posterior la    . C5-c6 neck fusion    . Left foot plantar and hammertoe    . Back surgery      L SPIN 2003   NECK 2004  . Dilation and curettage of uterus      YEARS AGO  . Craniotomy  08/13/2011    Procedure: CRANIOTOMY TUMOR EXCISION;  Surgeon: Cristi Loron, MD;  Location: [facility];   Service: Neurosurgery;  Laterality: Bilateral;  Bifrontal craniotomy for tumor  . Abdominal hysterectomy    . Brain meningioma excision  8/10    X 2  . Wrist surgery Right   . Colonoscopy      There were no vitals filed for this visit.      Subjective Assessment - 05/02/15 1406    Subjective Patient has soreness in back esp with walking or prolong standing, feeling better today   Pertinent History lumbar fusions 2003/15; neck surgery 2003, brain surgery 2013, 3 arthroscopic knee surgeries   Limitations Walking;Standing   How long can you stand comfortably? one hour   How long can you walk comfortably? one hour   Currently in Pain? Yes   Pain Score 1    Pain Location Back   Pain Orientation Left;Lower   Pain Descriptors / Indicators Sore   Pain Type Chronic pain   Pain Onset More than a month ago   Pain Frequency Intermittent   Aggravating Factors  prolong standing or walking  Pain Relieving Factors at rest                         [facility] Adult PT Treatment/Exercise - 05/02/15 0001    Moist Heat Therapy   Number Minutes Moist Heat 15 Minutes   Moist Heat Location Lumbar Spine   Electrical Stimulation   Electrical Stimulation Location left QL and PSIS   Electrical Stimulation Action premod   Electrical Stimulation Parameters 80/150hz    Electrical Stimulation Goals Pain   Ultrasound   Ultrasound Location left lower low back paraspinals   Ultrasound Parameters 1.5w/cm2/50%/2mhz x79min   Ultrasound Goals Pain   Manual Therapy   Manual Therapy Soft tissue mobilization   Soft tissue mobilization manual STW to left QL area of pain and lumbar paraspinals                  PT Short Term Goals - 04/18/15 1402    PT SHORT TERM GOAL #1   Title I with HEP   Time 2   Period Weeks   Status Achieved  Patient independent and has good understanding of HEP's 04/18/15           PT Long Term Goals - 05/02/15 1417    PT LONG TERM GOAL #1   Title  decreased pain in back by 50% or better with ADLS including sweeping and vacuuming.   Time 8   Period Weeks   Status Achieved  improved by 60%-70% 05/02/15   PT LONG TERM GOAL #2   Title patient able to ascend/descend stairs without AD.   Time 8   Period Weeks   Status Achieved  05/02/15   PT LONG TERM GOAL #3   Title improved B hip and knee strength to 4+/5 to improve function.   Time 8   Period Weeks   Status On-going   PT LONG TERM GOAL #4   Title Patient able to turn in bed without having to sit up first.   Time 8   Period Weeks   Status Achieved  05/02/15               Plan - 05/02/15 1433    Clinical Impression Statement Patient progressing overall and feels 60%-70% better overall. She is able to perform ADL's with ease and perform stairs without a assist device and get out of bed and turn with ease. Patient met LTG #1, #2, and #4 today. strength goal ongoing at this time.   Rehab Potential Good   PT Frequency 2x / week   PT Duration 8 weeks   PT Treatment/Interventions ADLs/Self Care Home Management;Electrical Stimulation;Moist Heat;Ultrasound;Stair training;Therapeutic exercise;Manual techniques;Patient/family education;Neuromuscular re-education;Passive range of motion;Dry needling   PT Next Visit Plan 1 visit then patient requested to be put on hold   Consulted and Agree with Plan of Care Patient      Patient will benefit from skilled therapeutic intervention in order to improve the following deficits and impairments:  Decreased range of motion, Decreased activity tolerance, Pain, Impaired flexibility, Decreased strength, Postural dysfunction  Visit Diagnosis: Left-sided low back pain without sciatica  Weakness of both lower limbs  Activity intolerance     Problem List Patient Active Problem List   Diagnosis Date Noted  . Spondylolisthesis of lumbar region 12/23/2013  . 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 Hyperthyroidism 05/25/2013  .  Obesity (BMI 30-39.9) 12/30/2012  . 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[facility]) 08/21/2011  . Meningioma ([facility]) 08/13/2011  . DIZZINESS 11/02/2009  .  Type 2 diabetes mellitus, controlled ([facility]) 05/20/2009  . HYPOKALEMIA 05/02/2009  . DEPRESSION 06/24/2008  . GERD 06/24/2008  . PEPTIC ULCER DISEASE 06/24/2008  . TMJ SYNDROME 06/09/2008  . Hyperlipemia 11/08/2006  . OBSTRUCTIVE SLEEP APNEA 11/08/2006  . Essential hypertension 11/08/2006  . ALLERGIC RHINITIS 11/08/2006  . VOCAL CORD DISORDER 11/08/2006  . ASTHMA 11/08/2006  . TACHYARRHYTHMIA 11/08/2006    Seaver Machia P, PTA 05/02/2015, 2:50 PM  [facility] [address] [place], [place], [address] Phone: [phone]   Fax:  [phone]  Name: Dawn Salas MRN: [id number] Date of Birth: [birth year]

## 2015-05-03 ENCOUNTER — Encounter: Payer: Self-pay | Admitting: Physical Therapy

## 2015-05-03 ENCOUNTER — Ambulatory Visit: Payer: Medicare Other | Admitting: Physical Therapy

## 2015-05-03 DIAGNOSIS — M545 Low back pain, unspecified: Secondary | ICD-10-CM

## 2015-05-03 DIAGNOSIS — M6281 Muscle weakness (generalized): Secondary | ICD-10-CM

## 2015-05-03 NOTE — Therapy (Signed)
[facility] [place], [place], [address] Phone: [phone]   Fax:  [phone]  Physical Therapy Treatment  Patient Details  Name: Dawn Salas MRN: [id number] Date of Birth: 1940/05/20 Referring Provider: Newman Pies, MD  Encounter Date: 05/03/2015      PT End of Session - 05/03/15 1339    Visit Number 7   Number of Visits 16   Date for PT Re-Evaluation 06/06/15   PT Start Time [id number]   PT Stop Time 1349   PT Time Calculation (min) 44 min   Activity Tolerance Patient tolerated treatment well   Behavior During Therapy [facility] for tasks assessed/performed      Past Medical History  Diagnosis Date  . Allergy   . Asthma   . Hyperlipidemia   . Hypertension   . Depression   . GERD (gastroesophageal reflux disease)   . Peptic ulcer disease   . Palpitations   . Multiple meningiomas of spine and brain ([place])   . Hyperglycemia   . Obstructive sleep apnea     [facility] DOES NOT USE C PAP  . Hyperthyroidism     PT HAD BIPOSY -NEGATIVE.Marland Kitchen AND NOW JUST GETS CHECKED EACH YEAR .Marland Kitchen 2013 LAST TEST   SEES DR. [name].   Marland Kitchen Arthritis   . Shortness of breath dyspnea     Has SOB with palpitations takes Metoprolol  . Pneumonia     "walking pneumonia" hx of  . Diabetes mellitus     Type two  . Seizures ([place])     has seizure after having second brain surgery  . Frequency of urination   . Diverticulitis   . History of hiatal hernia     Past Surgical History  Procedure Laterality Date  . Tonsillectomy    . Tubal ligation    . Right knee arthroscopy      x 3  . Right foot bunectomy    . Right shoulder rotator cuff    . L4-l5 posterior la    . C5-c6 neck fusion    . Left foot plantar and hammertoe    . Back surgery      L SPIN 2003   NECK 2004  . Dilation and curettage of uterus      YEARS AGO  . Craniotomy  08/13/2011    Procedure: CRANIOTOMY TUMOR EXCISION;  Surgeon: [name], MD;  Location: [place] [facility];   Service: Neurosurgery;  Laterality: Bilateral;  Bifrontal craniotomy for tumor  . Abdominal hysterectomy    . Brain meningioma excision  8/10    X 2  . Wrist surgery Right   . Colonoscopy      There were no vitals filed for this visit.      Subjective Assessment - 05/03/15 1337    Subjective States that she still has low back pressure with walking.   Pertinent History lumbar fusions 2003/15; neck surgery 2003, brain surgery 2013, 3 arthroscopic knee surgeries   Limitations Walking;Standing   How long can you stand comfortably? one hour   How long can you walk comfortably? one hour   Patient Stated Goals less pain   Currently in Pain? Yes   Pain Score 1    Pain Location Back   Pain Orientation Left;Lower   Pain Descriptors / Indicators Pressure   Pain Type Chronic pain   Pain Onset More than a month ago            [facility] PT  Assessment - 05/03/15 0001    Assessment   Medical Diagnosis chronic LBP   Onset Date/Surgical Date 12/22/13   Next MD Visit 4 months    Precautions   Precaution Comments to be careful with sweeping and vacuuming                     [facility] Adult PT Treatment/Exercise - 05/03/15 0001    Modalities   Modalities Electrical Stimulation;Moist Heat;Ultrasound   Moist Heat Therapy   Number Minutes Moist Heat 15 Minutes   Moist Heat Location Lumbar Spine   Electrical Stimulation   Electrical Stimulation Location L low back    Electrical Stimulation Action Pre-Mod   Electrical Stimulation Parameters 80-150 Hz x15 min   Electrical Stimulation Goals Pain   Ultrasound   Ultrasound Location L QL/ Upper Glute   Ultrasound Parameters 1.5 w/cm2, 100%, 1 mhz x10 min   Ultrasound Goals Pain   Manual Therapy   Manual Therapy Myofascial release   Myofascial Release MFR/STW to L QL/ Paraspinals/ Upper Glute to decrease pain and tightness in R sidelying                  PT Short Term Goals - 04/18/15 1402    PT SHORT TERM GOAL #1    Title I with HEP   Time 2   Period Weeks   Status Achieved  Patient independent and has good understanding of HEP's 04/18/15           PT Long Term Goals - 05/02/15 1417    PT LONG TERM GOAL #1   Title decreased pain in back by 50% or better with ADLS including sweeping and vacuuming.   Time 8   Period Weeks   Status Achieved  improved by 60%-70% 05/02/15   PT LONG TERM GOAL #2   Title patient able to ascend/descend stairs without AD.   Time 8   Period Weeks   Status Achieved  05/02/15   PT LONG TERM GOAL #3   Title improved B hip and knee strength to 4+/5 to improve function.   Time 8   Period Weeks   Status On-going   PT LONG TERM GOAL #4   Title Patient able to turn in bed without having to sit up first.   Time 8   Period Weeks   Status Achieved  05/02/15               Plan - 05/03/15 1344    Clinical Impression Statement Patient tolerated today's treatment well with only minimal soreness with manual therapy around L QL or close to incision. Minimal tightness noted in L Upper Glute, QL or lumbar paraspinals today in R sidelying. Patient no longer wishes to be put on hold at this time and wishes to continue PT. Normal modalities response noted followng removal of the modalities. Has achieved all goals set at evaluation except for LE strength. Patient noted that she felt "good" following today's treatment and denied L LBP upon end of treatmentt.   Rehab Potential Good   PT Frequency 2x / week   PT Duration 8 weeks   PT Treatment/Interventions ADLs/Self Care Home Management;Electrical Stimulation;Moist Heat;Ultrasound;Stair training;Therapeutic exercise;Manual techniques;Patient/family education;Neuromuscular re-education;Passive range of motion;Dry needling   PT Next Visit Plan Continue with PT per MPT POC. Begin low level core and LE strengthening if pain level low per MPT POC.   PT Home Exercise Plan HS, piriformis and SKTC stretches   Consulted and Agree  with Plan  of Care Patient      Patient will benefit from skilled therapeutic intervention in order to improve the following deficits and impairments:  Decreased range of motion, Decreased activity tolerance, Pain, Impaired flexibility, Decreased strength, Postural dysfunction  Visit Diagnosis: Left-sided low back pain without sciatica  Muscle weakness (generalized)     Problem List Patient Active Problem List   Diagnosis Date Noted  . Spondylolisthesis of lumbar region 12/23/2013  . 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 Hyperthyroidism 05/25/2013  . Obesity (BMI 30-39.9) 12/30/2012  . 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[place]) 08/21/2011  . Meningioma ([place]) 08/13/2011  . DIZZINESS 11/02/2009  . Type 2 diabetes mellitus, controlled ([place]) 05/20/2009  . HYPOKALEMIA 05/02/2009  . DEPRESSION 06/24/2008  . GERD 06/24/2008  . PEPTIC ULCER DISEASE 06/24/2008  . TMJ SYNDROME 06/09/2008  . Hyperlipemia 11/08/2006  . OBSTRUCTIVE SLEEP APNEA 11/08/2006  . Essential hypertension 11/08/2006  . ALLERGIC RHINITIS 11/08/2006  . VOCAL CORD DISORDER 11/08/2006  . ASTHMA 11/08/2006  . TACHYARRHYTHMIA 11/08/2006    Ahmed Prima, PTA 05/03/2015 2:03 PM Mali Applegate MPT [facility] [address] [place], [place], [address] Phone: [phone]   Fax:  [phone]  Name: Dawn Salas MRN: [id number] Date of Birth: [birth year]

## 2015-05-10 ENCOUNTER — Encounter: Payer: Medicare Other | Admitting: Physical Therapy

## 2015-05-11 ENCOUNTER — Other Ambulatory Visit: Payer: Self-pay | Admitting: Family Medicine

## 2015-05-12 ENCOUNTER — Encounter: Payer: Self-pay | Admitting: Physical Therapy

## 2015-05-12 ENCOUNTER — Ambulatory Visit: Payer: Medicare Other | Admitting: Physical Therapy

## 2015-05-12 DIAGNOSIS — M6281 Muscle weakness (generalized): Secondary | ICD-10-CM

## 2015-05-12 DIAGNOSIS — M545 Low back pain, unspecified: Secondary | ICD-10-CM

## 2015-05-12 NOTE — Therapy (Signed)
[facility] [place], [place], [address] Phone: [phone]   Fax:  [phone]  Physical Therapy Treatment  Patient Details  Name: Dawn Salas MRN: [id number] Date of Birth: 1940/07/24 Referring Provider: Newman Pies, MD  Encounter Date: 05/12/2015      PT End of Session - 05/12/15 1433    Visit Number 8   Number of Visits 16   Date for PT Re-Evaluation 07/03/15   PT Start Time [id number]   PT Stop Time 1529   PT Time Calculation (min) 54 min   Activity Tolerance Patient tolerated treatment well   Behavior During Therapy [facility] for tasks assessed/performed      Past Medical History  Diagnosis Date  . Allergy   . Asthma   . Hyperlipidemia   . Hypertension   . Depression   . GERD (gastroesophageal reflux disease)   . Peptic ulcer disease   . Palpitations   . Multiple meningiomas of spine and brain ([place])   . Hyperglycemia   . Obstructive sleep apnea     [facility]UD[facility] DOES NOT USE C PAP  . Hyperthyroidism     PT HAD BIPOSY -NEGATIVE.Marland Kitchen AND NOW JUST GETS CHECKED EACH YEAR .Marland Kitchen 2013 LAST TEST   SEES DR. [name].   Marland Kitchen Arthritis   . Shortness of breath dyspnea     Has SOB with palpitations takes Metoprolol  . Pneumonia     "walking pneumonia" hx of  . Diabetes mellitus     Type two  . Seizures ([place])     has seizure after having second brain surgery  . Frequency of urination   . Diverticulitis   . History of hiatal hernia     Past Surgical History  Procedure Laterality Date  . Tonsillectomy    . Tubal ligation    . Right knee arthroscopy      x 3  . Right foot bunectomy    . Right shoulder rotator cuff    . L4-l5 posterior la    . C5-c6 neck fusion    . Left foot plantar and hammertoe    . Back surgery      L SPIN 2003   NECK 2004  . Dilation and curettage of uterus      YEARS AGO  . Craniotomy  08/13/2011    Procedure: CRANIOTOMY TUMOR EXCISION;  Surgeon: [name], MD;  Location: [place] [facility];   Service: Neurosurgery;  Laterality: Bilateral;  Bifrontal craniotomy for tumor  . Abdominal hysterectomy    . Brain meningioma excision  8/10    X 2  . Wrist surgery Right   . Colonoscopy      There were no vitals filed for this visit.      Subjective Assessment - 05/12/15 1435    Subjective States that low back is good today with no pain only R knee pain today. R knee has had multiple surgeries in the past.   Pertinent History lumbar fusions 2003/15; neck surgery 2003, brain surgery 2013, 3 arthroscopic knee surgeries   Limitations Walking;Standing   How long can you stand comfortably? one hour   How long can you walk comfortably? one hour   Patient Stated Goals less pain   Currently in Pain? Yes   Pain Score 6    Pain Location Knee   Pain Orientation Right   Pain Descriptors / Indicators Sore   Pain Type Chronic pain   Pain Onset More than a month  ago            [facility] PT Assessment - 05/12/15 0001    Assessment   Medical Diagnosis chronic LBP   Onset Date/Surgical Date 12/22/13   Next MD Visit 7-08/2015   Precautions   Precaution Comments to be careful with sweeping and vacuuming                     [facility] Adult PT Treatment/Exercise - 05/12/15 0001    Lumbar Exercises: Aerobic   Stationary Bike NuStep L4 x10 min with VCs for proper posture and core activation   Lumbar Exercises: Standing   Other Standing Lumbar Exercises B seated row, ext with Pink XTS x30 reps each   Lumbar Exercises: Seated   Long Arc Quad on Chair Strengthening;Both;2 sets;10 reps;Weights   LAQ on Chair Weights (lbs) 3   Lumbar Exercises: Supine   Ab Set 20 reps;5 seconds   Clam 20 reps   Bent Knee Raise 20 reps   Bridge 20 reps;3 seconds   Straight Leg Raise 20 reps;Other (comment)  BLE   Lumbar Exercises: Sidelying   Clam 20 reps;Other (comment)  BLE   Modalities   Modalities Electrical Stimulation;Moist Heat   Moist Heat Therapy   Number Minutes Moist Heat 15 Minutes    Moist Heat Location Lumbar Spine   Electrical Stimulation   Electrical Stimulation Location B lumbar paraspinals   Electrical Stimulation Action Pre-Mod   Electrical Stimulation Parameters 80-150 hz x15 min   Electrical Stimulation Goals Pain                  PT Short Term Goals - 04/18/15 1402    PT SHORT TERM GOAL #1   Title I with HEP   Time 2   Period Weeks   Status Achieved  Patient independent and has good understanding of HEP's 04/18/15           PT Long Term Goals - 05/02/15 1417    PT LONG TERM GOAL #1   Title decreased pain in back by 50% or better with ADLS including sweeping and vacuuming.   Time 8   Period Weeks   Status Achieved  improved by 60%-70% 05/02/15   PT LONG TERM GOAL #2   Title patient able to ascend/descend stairs without AD.   Time 8   Period Weeks   Status Achieved  05/02/15   PT LONG TERM GOAL #3   Title improved B hip and knee strength to 4+/5 to improve function.   Time 8   Period Weeks   Status On-going   PT LONG TERM GOAL #4   Title Patient able to turn in bed without having to sit up first.   Time 8   Period Weeks   Status Achieved  05/02/15               Plan - 05/12/15 1520    Clinical Impression Statement Patient tolerated today's treatment well with initial lumbar and core strengthening with no reports of pain. Completed all exercises with VCs for core activation for core strengthening. WIth sitting exercises patient required VCs for proper sitting posture. Last remaining goal is on-going at this time secondary to working on LE strengthening at this time. Normal modalites response noted following removal of the modaliites. Patient required short rest break following transitional movements as she has been experiencing vertigo symptoms. Patient denied low back pain following today's treatment.   Rehab Potential Good   PT  Frequency 2x / week   PT Duration 8 weeks   PT Treatment/Interventions ADLs/Self Care Home  Management;Electrical Stimulation;Moist Heat;Ultrasound;Stair training;Therapeutic exercise;Manual techniques;Patient/family education;Neuromuscular re-education;Passive range of motion;Dry needling   PT Next Visit Plan Continue with PT per MPT POC. Begin low level core and LE strengthening if pain level low per MPT POC.   PT Home Exercise Plan HS, piriformis and SKTC stretches   Consulted and Agree with Plan of Care Patient      Patient will benefit from skilled therapeutic intervention in order to improve the following deficits and impairments:  Decreased range of motion, Decreased activity tolerance, Pain, Impaired flexibility, Decreased strength, Postural dysfunction  Visit Diagnosis: Left-sided low back pain without sciatica  Muscle weakness (generalized)     Problem List Patient Active Problem List   Diagnosis Date Noted  . Spondylolisthesis of lumbar region 12/23/2013  . 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 Hyperthyroidism 05/25/2013  . Obesity (BMI 30-39.9) 12/30/2012  . 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[place]) 08/21/2011  . Meningioma ([place]) 08/13/2011  . DIZZINESS 11/02/2009  . Type 2 diabetes mellitus, controlled ([place]) 05/20/2009  . HYPOKALEMIA 05/02/2009  . DEPRESSION 06/24/2008  . GERD 06/24/2008  . PEPTIC ULCER DISEASE 06/24/2008  . TMJ SYNDROME 06/09/2008  . Hyperlipemia 11/08/2006  . OBSTRUCTIVE SLEEP APNEA 11/08/2006  . Essential hypertension 11/08/2006  . ALLERGIC RHINITIS 11/08/2006  . VOCAL CORD DISORDER 11/08/2006  . ASTHMA 11/08/2006  . TACHYARRHYTHMIA 11/08/2006    Wynelle Fanny, PTA 05/12/2015, 4:00 PM  [facility] [address] [place], [place], [address] Phone: [phone]   Fax:  [phone]  Name: Dawn Salas MRN: [id number] Date of Birth: [birth year]

## 2015-05-16 ENCOUNTER — Encounter: Payer: Self-pay | Admitting: Physical Therapy

## 2015-05-16 ENCOUNTER — Ambulatory Visit: Payer: Medicare Other | Admitting: Physical Therapy

## 2015-05-16 DIAGNOSIS — M545 Low back pain, unspecified: Secondary | ICD-10-CM

## 2015-05-16 DIAGNOSIS — M6281 Muscle weakness (generalized): Secondary | ICD-10-CM

## 2015-05-16 NOTE — Therapy (Signed)
[facility] [place], [place], [address] Phone: [phone]   Fax:  [phone]  Physical Therapy Treatment  Patient Details  Name: Dawn Salas MRN: [id number] Date of Birth: Nov 04, 1940 Referring Provider: Newman Pies, MD  Encounter Date: 05/16/2015      PT End of Session - 05/16/15 1445    Visit Number 9   Number of Visits 16   Date for PT Re-Evaluation 07/03/15   PT Start Time [id number]   PT Stop Time 1525   PT Time Calculation (min) 49 min   Activity Tolerance Patient tolerated treatment well   Behavior During Therapy [facility] for tasks assessed/performed      Past Medical History  Diagnosis Date  . Allergy   . Asthma   . Hyperlipidemia   . Hypertension   . Depression   . GERD (gastroesophageal reflux disease)   . Peptic ulcer disease   . Palpitations   . Multiple meningiomas of spine and brain ([place])   . Hyperglycemia   . Obstructive sleep apnea     STUDY AT [facility] DOES NOT USE C PAP  . Hyperthyroidism     PT HAD BIPOSY -NEGATIVE.Marland Kitchen AND NOW JUST GETS CHECKED EACH YEAR .Marland Kitchen 2013 LAST TEST   SEES DR. [name].   Marland Kitchen Arthritis   . Shortness of breath dyspnea     Has SOB with palpitations takes Metoprolol  . Pneumonia     "walking pneumonia" hx of  . Diabetes mellitus     Type two  . Seizures ([place])     has seizure after having second brain surgery  . Frequency of urination   . Diverticulitis   . History of hiatal hernia     Past Surgical History  Procedure Laterality Date  . Tonsillectomy    . Tubal ligation    . Right knee arthroscopy      x 3  . Right foot bunectomy    . Right shoulder rotator cuff    . L4-l5 posterior la    . C5-c6 neck fusion    . Left foot plantar and hammertoe    . Back surgery      L SPIN 2003   NECK 2004  . Dilation and curettage of uterus      YEARS AGO  . Craniotomy  08/13/2011    Procedure: CRANIOTOMY TUMOR EXCISION;  Surgeon: [name], MD;  Location: [place] [facility];   Service: Neurosurgery;  Laterality: Bilateral;  Bifrontal craniotomy for tumor  . Abdominal hysterectomy    . Brain meningioma excision  8/10    X 2  . Wrist surgery Right   . Colonoscopy      There were no vitals filed for this visit.      Subjective Assessment - 05/16/15 1445    Subjective Reports no LBP only knee pain today. States that her back has good day and bad days and has thought about going to a chiropractor. Also encountering R elbow and hip discomfort that patient thinks may be bursitis. Only reports increased low back discomfort with certain movements. Notes that she has increased LBP with washing dishes.   Pertinent History lumbar fusions 2003/15; neck surgery 2003, brain surgery 2013, 3 arthroscopic knee surgeries   Limitations Walking;Standing   How long can you stand comfortably? one hour   How long can you walk comfortably? one hour   Patient Stated Goals less pain   Currently in Pain? Yes   Pain Score 4  Pain Location Knee   Pain Orientation Right;Left   Pain Descriptors / Indicators Aching   Pain Type Chronic pain   Pain Onset More than a month ago            [facility] PT Assessment - 05/16/15 0001    Assessment   Medical Diagnosis chronic LBP   Onset Date/Surgical Date 12/22/13   Next MD Visit 7-08/2015   Precautions   Precaution Comments to be careful with sweeping and vacuuming                     [facility] Adult PT Treatment/Exercise - 05/16/15 0001    Lumbar Exercises: Aerobic   Stationary Bike NuStep L6 x12 min with VCs for proper posture and core activation   Lumbar Exercises: Standing   Row Strengthening;Both  3x10 reps; Pink XTS   Shoulder Extension Strengthening;Both  3x10 reps Pink XTS   Lumbar Exercises: Seated   Long Arc Quad on Chair Strengthening;Both;2 sets;10 reps;Weights   LAQ on Chair Weights (lbs) 3   Lumbar Exercises: Supine   Ab Set 20 reps;5 seconds   Clam 20 reps   Bent Knee Raise 15 reps   Bridge 20 reps;3  seconds   Straight Leg Raise 20 reps;Other (comment)  BLE   Lumbar Exercises: Sidelying   Clam 20 reps;Other (comment)  BLE   Modalities   Modalities Electrical Stimulation;Moist Heat   Moist Heat Therapy   Number Minutes Moist Heat 15 Minutes   Moist Heat Location Lumbar Spine   Electrical Stimulation   Electrical Stimulation Location B lumbar paraspinals   Electrical Stimulation Action Pre-Mod   Electrical Stimulation Parameters 80-150 Hz x15 mn   Electrical Stimulation Goals Pain                  PT Short Term Goals - 04/18/15 1402    PT SHORT TERM GOAL #1   Title I with HEP   Time 2   Period Weeks   Status Achieved  Patient independent and has good understanding of HEP's 04/18/15           PT Long Term Goals - 05/02/15 1417    PT LONG TERM GOAL #1   Title decreased pain in back by 50% or better with ADLS including sweeping and vacuuming.   Time 8   Period Weeks   Status Achieved  improved by 60%-70% 05/02/15   PT LONG TERM GOAL #2   Title patient able to ascend/descend stairs without AD.   Time 8   Period Weeks   Status Achieved  05/02/15   PT LONG TERM GOAL #3   Title improved B hip and knee strength to 4+/5 to improve function.   Time 8   Period Weeks   Status On-going   PT LONG TERM GOAL #4   Title Patient able to turn in bed without having to sit up first.   Time 8   Period Weeks   Status Achieved  05/02/15               Plan - 05/16/15 1518    Clinical Impression Statement Patient tolerated today's treatment well with lumbar and core strengthening exercises. Patient required minimal to moderate multimodal cueing for proper exercise technique and corrections as well as VCs for core activation. Experienced R hip discomfort in R sidelying for clamshell due to possible bursitis flare up at this time. Normal modalites response noted following removal of the modalities. Continues to have LBP  with activities such as washing dishes at this  time. Patient continues to experience vertigo symptoms during transitional movements. Patient denied LBP upon end of treatment today.   Rehab Potential Good   PT Frequency 2x / week   PT Duration 8 weeks   PT Treatment/Interventions ADLs/Self Care Home Management;Electrical Stimulation;Moist Heat;Ultrasound;Stair training;Therapeutic exercise;Manual techniques;Patient/family education;Neuromuscular re-education;Passive range of motion;Dry needling   PT Next Visit Plan Continue with PT per MPT POC. Begin low level core and LE strengthening if pain level low per MPT POC.   PT Home Exercise Plan HS, piriformis and SKTC stretches   Consulted and Agree with Plan of Care Patient      Patient will benefit from skilled therapeutic intervention in order to improve the following deficits and impairments:  Decreased range of motion, Decreased activity tolerance, Pain, Impaired flexibility, Decreased strength, Postural dysfunction  Visit Diagnosis: Left-sided low back pain without sciatica  Muscle weakness (generalized)     Problem List Patient Active Problem List   Diagnosis Date Noted  . Spondylolisthesis of lumbar region 12/23/2013  . 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 Hyperthyroidism 05/25/2013  . Obesity (BMI 30-39.9) 12/30/2012  . 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[place]) 08/21/2011  . Meningioma ([place]) 08/13/2011  . DIZZINESS 11/02/2009  . Type 2 diabetes mellitus, controlled ([place]) 05/20/2009  . HYPOKALEMIA 05/02/2009  . DEPRESSION 06/24/2008  . GERD 06/24/2008  . PEPTIC ULCER DISEASE 06/24/2008  . TMJ SYNDROME 06/09/2008  . Hyperlipemia 11/08/2006  . OBSTRUCTIVE SLEEP APNEA 11/08/2006  . Essential hypertension 11/08/2006  . ALLERGIC RHINITIS 11/08/2006  . VOCAL CORD DISORDER 11/08/2006  . ASTHMA 11/08/2006  . TACHYARRHYTHMIA 11/08/2006    Wynelle Fanny, PTA 05/16/2015, 3:37 PM  [facility] [address] [place], [place],  [address] Phone: [phone]   Fax:  [phone]  Name: Dawn Salas MRN: [id number] Date of Birth: July 06, 1940

## 2015-05-19 ENCOUNTER — Encounter: Payer: Medicare Other | Admitting: Physical Therapy

## 2015-05-23 ENCOUNTER — Encounter: Payer: Self-pay | Admitting: Physical Therapy

## 2015-05-23 ENCOUNTER — Ambulatory Visit: Payer: Medicare Other | Attending: Neurosurgery | Admitting: Physical Therapy

## 2015-05-23 DIAGNOSIS — M545 Low back pain, unspecified: Secondary | ICD-10-CM

## 2015-05-23 DIAGNOSIS — M6281 Muscle weakness (generalized): Secondary | ICD-10-CM | POA: Diagnosis present

## 2015-05-23 NOTE — Therapy (Addendum)
[facility] [place], [place], [address] Phone: [phone]   Fax:  [phone]  Physical Therapy Treatment  Patient Details  Name: Dawn Salas MRN: [id number] Date of Birth: 1940/07/07 Referring Provider: Newman Pies, MD  Encounter Date: 05/23/2015      PT End of Session - 05/23/15 1434    Visit Number 10   Number of Visits 16   Date for PT Re-Evaluation 07/03/15   PT Start Time 7893   PT Stop Time 1519   PT Time Calculation (min) 45 min   Activity Tolerance Patient tolerated treatment well   Behavior During Therapy [facility] for tasks assessed/performed      Past Medical History  Diagnosis Date  . Allergy   . Asthma   . Hyperlipidemia   . Hypertension   . Depression   . GERD (gastroesophageal reflux disease)   . Peptic ulcer disease   . Palpitations   . Multiple meningiomas of spine and brain ([place])   . Hyperglycemia   . Obstructive sleep apnea     [facility]UDY [facility] DOES NOT USE C PAP  . Hyperthyroidism     PT HAD BIPOSY -NEGATIVE.Marland Kitchen AND NOW JUST GETS CHECKED EACH YEAR .Marland Kitchen 2013 LAST TEST   SEES DR. [name].   Marland Kitchen Arthritis   . Shortness of breath dyspnea     Has SOB with palpitations takes Metoprolol  . Pneumonia     "walking pneumonia" hx of  . Diabetes mellitus     Type two  . Seizures ([place])     has seizure after having second brain surgery  . Frequency of urination   . Diverticulitis   . History of hiatal hernia     Past Surgical History  Procedure Laterality Date  . Tonsillectomy    . Tubal ligation    . Right knee arthroscopy      x 3  . Right foot bunectomy    . Right shoulder rotator cuff    . L4-l5 posterior la    . C5-c6 neck fusion    . Left foot plantar and hammertoe    . Back surgery      L SPIN 2003   NECK 2004  . Dilation and curettage of uterus      YEARS AGO  . Craniotomy  08/13/2011    Procedure: CRANIOTOMY TUMOR EXCISION;  Surgeon: [name], MD;  Location: [place] [facility];   Service: Neurosurgery;  Laterality: Bilateral;  Bifrontal craniotomy for tumor  . Abdominal hysterectomy    . Brain meningioma excision  8/10    X 2  . Wrist surgery Right   . Colonoscopy      There were no vitals filed for this visit.      Subjective Assessment - 05/23/15 1432    Subjective Reports increased R knee and hip pain and thinks that her favoring that side with walking is what is causing her back to hurt. Went to pool today and got in the water.   Pertinent History lumbar fusions 2003/15; neck surgery 2003, brain surgery 2013, 3 arthroscopic knee surgeries   Limitations Walking;Standing   How long can you stand comfortably? one hour   How long can you walk comfortably? one hour   Patient Stated Goals less pain   Currently in Pain? Yes   Pain Score 4    Pain Location Back   Pain Orientation Right;Lower   Pain Descriptors / Indicators Pressure   Pain Type Chronic  pain   Pain Onset More than a month ago   Multiple Pain Sites Yes   Pain Score 7   Pain Location Knee   Pain Orientation Right;Left   Pain Descriptors / Indicators Patsi Sears PT Assessment - 05/23/15 0001    Assessment   Medical Diagnosis chronic LBP   Onset Date/Surgical Date 12/22/13   Next MD Visit 7-08/2015   Precautions   Precaution Comments to be careful with sweeping and vacuuming                     [facility] Adult PT Treatment/Exercise - 05/23/15 0001    Modalities   Modalities Electrical Stimulation;Moist Heat;Ultrasound   Moist Heat Therapy   Number Minutes Moist Heat 15 Minutes   Moist Heat Location Lumbar Spine   Electrical Stimulation   Electrical Stimulation Location B lumbar paraspinals   Electrical Stimulation Action Pre-Mod   Electrical Stimulation Parameters 80-150 Hz x15 min   Electrical Stimulation Goals Pain   Ultrasound   Ultrasound Location B lumbar paraspinals/ QL   Ultrasound Parameters 1.2 w/cm2, 100%,1 mhz x10 min   Ultrasound Goals Pain    Manual Therapy   Manual Therapy Myofascial release   Myofascial Release MFR/STW to B QL/ Lumbar Paraspinals to decrease pain and tightness in L sidelying                  PT Short Term Goals - 04/18/15 1402    PT SHORT TERM GOAL #1   Title I with HEP   Time 2   Period Weeks   Status Achieved  Patient independent and has good understanding of HEP's 04/18/15           PT Long Term Goals - 05/02/15 1417    PT LONG TERM GOAL #1   Title decreased pain in back by 50% or better with ADLS including sweeping and vacuuming.   Time 8   Period Weeks   Status Achieved  improved by 60%-70% 05/02/15   PT LONG TERM GOAL #2   Title patient able to ascend/descend stairs without AD.   Time 8   Period Weeks   Status Achieved  05/02/15   PT LONG TERM GOAL #3   Title improved B hip and knee strength to 4+/5 to improve function.   Time 8   Period Weeks   Status On-going   PT LONG TERM GOAL #4   Title Patient able to turn in bed without having to sit up first.   Time 8   Period Weeks   Status Achieved  05/02/15               Plan - 05/23/15 1601    Clinical Impression Statement Patient limited with exercise today as she has been experiencing increased R hip and knee pain. Patient has been compensating with gait due to the pain and thinks that may have attributed to low back pain today. Normal modalities response noted following removal of the modalities. Patient presented with low back tightness with R >L in lumbar paraspinals and QL tightness. No tenderness or soreness to palpation was noted today during manual therapy. Patient also continues to experience vertigo symptoms with transitional movments per patient report.   Rehab Potential Good   PT Frequency 2x / week   PT Duration 8 weeks   PT Treatment/Interventions ADLs/Self Care Home Management;Electrical Stimulation;Moist Heat;Ultrasound;Stair training;Therapeutic exercise;Manual techniques;Patient/family  education;Neuromuscular re-education;Passive range  of motion;Dry needling   PT Next Visit Plan Continue with PT per MPT POC. Begin low level core and LE strengthening if pain level low per MPT POC.   PT Home Exercise Plan HS, piriformis and SKTC stretches   Consulted and Agree with Plan of Care Patient      Patient will benefit from skilled therapeutic intervention in order to improve the following deficits and impairments:  Decreased range of motion, Decreased activity tolerance, Pain, Impaired flexibility, Decreased strength, Postural dysfunction  Visit Diagnosis: Left-sided low back pain without sciatica  Muscle weakness (generalized)       G-Codes - 05/24/2015 1609    Functional Assessment Tool Used FOTO 46% limited   Functional Limitation Mobility: Walking and moving around   Mobility: Walking and Moving Around Current Status [phone]) At least 40 percent but less than 60 percent impaired, limited or restricted   Mobility: Walking and Moving Around Goal Status [phone]) At least 40 percent but less than 60 percent impaired, limited or restricted      Problem List Patient Active Problem List   Diagnosis Date Noted  . Spondylolisthesis of lumbar region 12/23/2013  . 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 Hyperthyroidism 05/25/2013  . Obesity (BMI 30-39.9) 12/30/2012  . 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[place]) 08/21/2011  . Meningioma ([place]) 08/13/2011  . DIZZINESS 11/02/2009  . Type 2 diabetes mellitus, controlled ([place]) 05/20/2009  . HYPOKALEMIA 05/02/2009  . DEPRESSION 06/24/2008  . GERD 06/24/2008  . PEPTIC ULCER DISEASE 06/24/2008  . TMJ SYNDROME 06/09/2008  . Hyperlipemia 11/08/2006  . OBSTRUCTIVE SLEEP APNEA 11/08/2006  . Essential hypertension 11/08/2006  . ALLERGIC RHINITIS 11/08/2006  . VOCAL CORD DISORDER 11/08/2006  . ASTHMA 11/08/2006  . TACHYARRHYTHMIA 11/08/2006    Ahmed Prima, PTA 05-24-2015 4:11 PM  [facility] [place], [place], [address] Phone: [phone]   Fax:  [phone]  Name: Dawn Salas MRN: [id number] Date of Birth: September 24, 1940   Madelyn Flavors, PT 05-24-15 4:12 PM [facility] [address] [place], [place], [address] Phone: [phone]   Fax:  [phone]   PHYSICAL THERAPY DISCHARGE SUMMARY  Visits from Start of Care: 10.  Current functional level related to goals / functional outcomes: See above.   Remaining deficits: Some continued hip weakness.   Education / Equipment: HEP. Plan: Patient agrees to discharge.  Patient goals were partially met. Patient is being discharged due to meeting the stated rehab goals.  ?????        Mali Applegate MPT

## 2015-05-27 ENCOUNTER — Encounter: Payer: Medicare Other | Admitting: Physical Therapy

## 2015-06-06 ENCOUNTER — Telehealth: Payer: Self-pay | Admitting: Family Medicine

## 2015-06-06 NOTE — Telephone Encounter (Signed)
Pt need new Rx for nabumetone 500 mg.   Pharm:  [facility]  In [place], [place]

## 2015-06-07 NOTE — Telephone Encounter (Signed)
Pt had a recent follow up on 04/04/2015 Last refill of medication was in 2013 Pt uses this PRN for her arthritis sx Okay to fill?

## 2015-06-07 NOTE — Telephone Encounter (Signed)
For safety would prefer OTC Tylenol first- if she has not tried.   Increased risk of ulcer and kidney problems with Nabumetone.

## 2015-06-08 NOTE — Telephone Encounter (Signed)
Pt is aware and agreeable with annotations below.

## 2015-08-01 ENCOUNTER — Other Ambulatory Visit: Payer: Self-pay | Admitting: Family Medicine

## 2015-08-09 ENCOUNTER — Other Ambulatory Visit: Payer: Self-pay | Admitting: Family Medicine

## 2015-08-19 ENCOUNTER — Ambulatory Visit: Payer: Medicare Other | Attending: Gynecology | Admitting: Gynecology

## 2015-08-19 ENCOUNTER — Encounter: Payer: Self-pay | Admitting: Gynecology

## 2015-08-19 VITALS — BP 148/81 | HR 80 | Temp 97.7°F | Resp 18 | Ht 64.0 in | Wt 207.0 lb

## 2015-08-19 DIAGNOSIS — K219 Gastro-esophageal reflux disease without esophagitis: Secondary | ICD-10-CM | POA: Diagnosis not present

## 2015-08-19 DIAGNOSIS — Z8041 Family history of malignant neoplasm of ovary: Secondary | ICD-10-CM | POA: Diagnosis not present

## 2015-08-19 DIAGNOSIS — K449 Diaphragmatic hernia without obstruction or gangrene: Secondary | ICD-10-CM | POA: Diagnosis not present

## 2015-08-19 DIAGNOSIS — Z9889 Other specified postprocedural states: Secondary | ICD-10-CM | POA: Insufficient documentation

## 2015-08-19 DIAGNOSIS — G4733 Obstructive sleep apnea (adult) (pediatric): Secondary | ICD-10-CM | POA: Insufficient documentation

## 2015-08-19 DIAGNOSIS — Z880 Allergy status to penicillin: Secondary | ICD-10-CM | POA: Insufficient documentation

## 2015-08-19 DIAGNOSIS — Z888 Allergy status to other drugs, medicaments and biological substances status: Secondary | ICD-10-CM | POA: Diagnosis not present

## 2015-08-19 DIAGNOSIS — Z79899 Other long term (current) drug therapy: Secondary | ICD-10-CM | POA: Diagnosis not present

## 2015-08-19 DIAGNOSIS — E785 Hyperlipidemia, unspecified: Secondary | ICD-10-CM | POA: Insufficient documentation

## 2015-08-19 DIAGNOSIS — J45909 Unspecified asthma, uncomplicated: Secondary | ICD-10-CM | POA: Insufficient documentation

## 2015-08-19 DIAGNOSIS — Z7984 Long term (current) use of oral hypoglycemic drugs: Secondary | ICD-10-CM | POA: Diagnosis not present

## 2015-08-19 DIAGNOSIS — Z90710 Acquired absence of both cervix and uterus: Secondary | ICD-10-CM | POA: Diagnosis not present

## 2015-08-19 DIAGNOSIS — E119 Type 2 diabetes mellitus without complications: Secondary | ICD-10-CM | POA: Insufficient documentation

## 2015-08-19 DIAGNOSIS — M1990 Unspecified osteoarthritis, unspecified site: Secondary | ICD-10-CM | POA: Insufficient documentation

## 2015-08-19 DIAGNOSIS — Z800 Family history of malignant neoplasm of digestive organs: Secondary | ICD-10-CM | POA: Diagnosis not present

## 2015-08-19 DIAGNOSIS — I10 Essential (primary) hypertension: Secondary | ICD-10-CM | POA: Insufficient documentation

## 2015-08-19 DIAGNOSIS — E0590 Thyrotoxicosis, unspecified without thyrotoxic crisis or storm: Secondary | ICD-10-CM | POA: Diagnosis not present

## 2015-08-19 DIAGNOSIS — R350 Frequency of micturition: Secondary | ICD-10-CM | POA: Insufficient documentation

## 2015-08-19 DIAGNOSIS — F329 Major depressive disorder, single episode, unspecified: Secondary | ICD-10-CM | POA: Diagnosis not present

## 2015-08-19 DIAGNOSIS — N83201 Unspecified ovarian cyst, right side: Secondary | ICD-10-CM | POA: Diagnosis not present

## 2015-08-19 NOTE — Progress Notes (Signed)
Consult Note: Gyn-Onc   Dawn Salas 75 y.o. female  Chief Complaint  Patient presents with  . Complex ovarian cyst    New Consultation    Assessment :Complex right ovarian cyst which has rapidly increased in size in the last 2 months.  Plan: I would recommend the patient undergo laparoscopic bilateral salpingo-oophorectomy with intraoperative frozen section. Should this be a malignancy, the patient wishes to proceed with exploratory laparotomy and surgical staging under the same anesthetic.  The risks of surgery were reviewed and questions answered. Patient understands that the primary surgeon be Dr. Janie Salas is available that day.     HPI: 75 year old African-American female gravida 6 seen in consultation at the request of Dr. [name] regarding management of a right ovarian cyst which is increasing in size. Because of a family history of ovarian cancer Dr. cousins obtained an ultrasound on 06/14/2015 revealing a 7 mm complex right ovarian cyst. The ultrasound was repeated on 08/17/2015 and now reveals a 3.5 x 2.3 x 1.8 cm complex cyst showing slight vascularity and calcifications. Left ovary appears normal. Patient has had CA-125 value consider 10 units per mL.  The patient's mother at age 32 developed ovarian cancer. The patient also reports that she had a maternal aunt with ovarian and pancreatic cancer. There is no apparent breast cancer in the family.  The patient's previously had a hysterectomy at age 32 for fibroids. She is also had a tubal ligation prior to that time. She has had several operations for meningiomas and is currently being monitored with serial MRI studies. She's also had number of other surgical procedures that have been successfully performed.  Review of Systems:10 point review of systems is negative except as noted in interval history.   Vitals: Blood pressure (!) 148/81, pulse 80, temperature 97.7 F (36.5 C), resp. rate 18, height 5\' 4"  (1.626 m),  weight 207 lb (93.9 kg), SpO2 98 %.  Physical Exam: General : The patient is a healthy woman in no acute distress.  HEENT: normocephalic, extraoccular movements normal; neck is supple without thyromegally  Lynphnodes: Supraclavicular and inguinal nodes not enlarged  Abdomen: Soft, non-tender, no ascites, no organomegally, no masses, no hernias  Pelvic:  EGBUS: Normal female  Vagina: Normal, no lesions  Urethra and Bladder: Normal, non-tender  Cervix: Surgically absent  Uterus: Surgically absent  Bi-manual examination: Non-tender; no adenxal masses or nodularity  Rectal: normal sphincter tone, no masses, no blood  Lower extremities: No edema or varicosities. Normal range of motion      Allergies  Allergen Reactions  . Flagyl [Metronidazole] Other (See Comments)    Caused increased heart rate  . Aspirin Effervescent Swelling    Alka-Seltzer gel caps- sore mouth, face swollen, turned hands white  . Morphine Sulfate     REACTION: hives  . Penicillins     REACTION: rash    Past Medical History:  Diagnosis Date  . Allergy   . Arthritis   . Asthma   . Depression   . Diabetes mellitus    Type two  . Diverticulitis   . Frequency of urination   . GERD (gastroesophageal reflux disease)   . History of hiatal hernia   . Hyperglycemia   . Hyperlipidemia   . Hypertension   . Hyperthyroidism    PT HAD BIPOSY -NEGATIVE.Marland Kitchen AND NOW JUST GETS CHECKED EACH YEAR .Marland Kitchen 2013 LAST TEST   SEES DR. [name].   . Multiple meningiomas of spine and brain ([place])   . Obstructive  sleep apnea    STUDY AT [facility] DOES NOT USE C PAP  . Palpitations   . Peptic ulcer disease   . Pneumonia    "walking pneumonia" hx of  . Seizures ([place])    has seizure after having second brain surgery  . Shortness of breath dyspnea    Has SOB with palpitations takes Metoprolol    Past Surgical History:  Procedure Laterality Date  . ABDOMINAL HYSTERECTOMY    . BACK SURGERY     L SPIN 2003   NECK 2004  . BRAIN  MENINGIOMA EXCISION  8/10   X 2  . C5-C6 neck fusion    . COLONOSCOPY    . CRANIOTOMY  08/13/2011   Procedure: CRANIOTOMY TUMOR EXCISION;  Surgeon: [name], MD;  Location: [place] [facility];  Service: Neurosurgery;  Laterality: Bilateral;  Bifrontal craniotomy for tumor  . DILATION AND CURETTAGE OF UTERUS     YEARS AGO  . L4-L5 posterior la    . left foot plantar and hammertoe    . right foot bunectomy    . right knee arthroscopy     x 3  . right shoulder rotator cuff    . TONSILLECTOMY    . TUBAL LIGATION    . WRIST SURGERY Right     Current Outpatient Prescriptions  Medication Sig Dispense Refill  . amLODipine (NORVASC) 2.5 MG tablet Take 1 tablet (2.5 mg total) by mouth daily. 90 tablet 3  . cetirizine (ZYRTEC) 10 MG tablet Take 10 mg by mouth daily as needed. For allergies    . Cyanocobalamin (VITAMIN B12 PO) Take 1 mL by mouth daily. OTC    . furosemide (LASIX) 40 MG tablet Take 0.5 tablets (20 mg total) by mouth daily as needed. For fluid 30 tablet 3  . glucose blood (ACCU-CHEK AVIVA PLUS) test strip 1 each by Other route 2 (two) times daily. E11.9 100 each 6  . Iron-Vitamins (GERITOL COMPLETE PO) Take 1 tablet by mouth daily.    [name] Devices (ACCU-CHEK SOFTCLIX) lancets 1 each by Other route 2 (two) times daily. E11.9 100 each 3  . losartan-hydrochlorothiazide (HYZAAR) 100-12.5 MG tablet TAKE 1 TABLET DAILY 90 tablet 2  . metFORMIN (GLUCOPHAGE) 500 MG tablet TAKE ONE TABLET TWICE A DAY WITH FOOD 60 tablet 5  . methimazole (TAPAZOLE) 5 MG tablet Take 5 mg by mouth daily.     . metoprolol succinate (TOPROL-XL) 100 MG 24 hr tablet TAKE ONE (1) TABLET EACH DAY 30 tablet 5  . potassium chloride (K-DUR) 10 MEQ tablet TAKE ONE (1) TABLET EACH DAY 30 tablet 11  . RABEprazole (ACIPHEX) 20 MG tablet TAKE ONE (1) TABLET EACH DAY 90 tablet 1  . Specialty Vitamins Products (MAGNESIUM, AMINO ACID CHELATE,) 133 MG tablet Take 1 tablet by mouth 2 (two) times daily. TAKES 250MG  DAILY.     . valACYclovir (VALTREX) 1000 MG tablet TAKE 2 TABLETS EVERY 12 HOURS FOR 1 DAY,AND THEN AS NEEDED 30 tablet 1  . VESICARE 5 MG tablet TAKE 1 TABLET DAILY 90 tablet 1  . promethazine (PHENERGAN) 25 MG tablet Take 1 tablet (25 mg total) by mouth every 6 (six) hours as needed for nausea or vomiting. (Patient not taking: Reported on 08/19/2015) 12 tablet 0   No current facility-administered medications for this visit.     Social History   Social History  . Marital status: Married    Spouse name: N/A  . Number of children: N/A  .  Years of education: N/A   Occupational History  . Not on file.   Social History Main Topics  . Smoking status: Never Smoker  . Smokeless tobacco: Never Used  . Alcohol use No  . Drug use: No  . Sexual activity: No   Other Topics Concern  . Not on file   Social History Narrative  . No narrative on file    Family History  Problem Relation Age of Onset  . Cancer Mother     ovarian  . Heart disease Father 2  . Arthritis Other   . Hyperlipidemia Other   . Hypertension Other   . Diabetes Other   . Anesthesia problems Daughter     NAUSEA AND VOMITING POST OP      Marti Sleigh, MD 08/19/2015, 11:58 AM

## 2015-08-19 NOTE — Patient Instructions (Signed)
Preparing for your Surgery  Plan for surgery on August 25, 2015 with Dr. [name] at [facility] will be scheduled for removal of both ovaries and possible staging procedures if a cancer is identified.  Pre-operative Testing -You will receive a phone call from presurgical testing at [facility] to arrange for a pre-operative testing appointment before your surgery.  This appointment normally occurs one to two weeks before your scheduled surgery.   -Bring your insurance card, copy of an advanced directive if applicable, medication list  -At that visit, you will be asked to sign a consent for a possible blood transfusion in case a transfusion becomes necessary during surgery.  The need for a blood transfusion is rare but having consent is a necessary part of your care.     -You should not be taking blood thinners or aspirin at least ten days prior to surgery unless instructed by your surgeon.  Day Before Surgery at [facility] will be asked to take in a light diet the day before surgery.  Avoid carbonated beverages.  You will be advised to have nothing to eat or drink after midnight the evening before.     Eat a light diet the day before surgery.  Examples including soups, broths, toast, yogurt, mashed potatoes.  Things to avoid include carbonated beverages (fizzy beverages), raw fruits and raw vegetables, or beans.    If your bowels are filled with gas, your surgeon will have difficulty visualizing your pelvic organs which increases your surgical risks.  Your role in recovery Your role is to become active as soon as directed by your doctor, while still giving yourself time to heal.  Rest when you feel tired. You will be asked to do the following in order to speed your recovery:  - Cough and breathe deeply. This helps toclear and expand your lungs and can prevent pneumonia. You may be given a spirometer to practice deep breathing. A staff member will show  you how to use the spirometer. - Do mild physical activity. Walking or moving your legs help your circulation and body functions return to normal. A staff member will help you when you try to walk and will provide you with simple exercises. Do not try to get up or walk alone the first time. - Actively manage your pain. Managing your pain lets you move in comfort. We will ask you to rate your pain on a scale of zero to 10. It is your responsibility to tell your doctor or nurse where and how much you hurt so your pain can be treated.  Special Considerations -If you are diabetic, you may be placed on insulin after surgery to have closer control over your blood sugars to promote healing and recovery.  This does not mean that you will be discharged on insulin.  If applicable, your oral antidiabetics will be resumed when you are tolerating a solid diet.  -Your final pathology results from surgery should be available by the Friday after surgery and the results will be relayed to you when available.   Blood Transfusion Information WHAT IS A BLOOD TRANSFUSION? A transfusion is the replacement of blood or some of its parts. Blood is made up of multiple cells which provide different functions.  Red blood cells carry oxygen and are used for blood loss replacement.  White blood cells fight against infection.  Platelets control bleeding.  Plasma helps clot blood.  Other blood products are available for specialized needs, such  as hemophilia or other clotting disorders. BEFORE THE TRANSFUSION  Who gives blood for transfusions?   You may be able to donate blood to be used at a later date on yourself (autologous donation).  Relatives can be asked to donate blood. This is generally not any safer than if you have received blood from a stranger. The same precautions are taken to ensure safety when a relative's blood is donated.  Healthy volunteers who are fully evaluated to make sure their blood is safe.  This is blood bank blood. Transfusion therapy is the safest it has ever been in the practice of medicine. Before blood is taken from a donor, a complete history is taken to make sure that person has no history of diseases nor engages in risky social behavior (examples are intravenous drug use or sexual activity with multiple partners). The donor's travel history is screened to minimize risk of transmitting infections, such as malaria. The donated blood is tested for signs of infectious diseases, such as HIV and hepatitis. The blood is then tested to be sure it is compatible with you in order to minimize the chance of a transfusion reaction. If you or a relative donates blood, this is often done in anticipation of surgery and is not appropriate for emergency situations. It takes many days to process the donated blood. RISKS AND COMPLICATIONS Although transfusion therapy is very safe and saves many lives, the main dangers of transfusion include:   Getting an infectious disease.  Developing a transfusion reaction. This is an allergic reaction to something in the blood you were given. Every precaution is taken to prevent this. The decision to have a blood transfusion has been considered carefully by your caregiver before blood is given. Blood is not given unless the benefits outweigh the risks.

## 2015-08-22 NOTE — Patient Instructions (Addendum)
Dawn Salas  08/22/2015   Your procedure is scheduled on: 08/25/2015    Report to [facility]  Entrance take [place]  elevators to 3rd floor to  [place] at  [place] AM.  Call this number if you have problems the morning of surgery [phone]   Remember: ONLY 1 PERSON MAY GO WITH YOU TO SHORT STAY TO GET  READY MORNING OF [place].  Do not eat food or drink liquids :After Midnight.             Eat a light diet the day before surgery.  Examples include: toast, broths, soups, yogurt and mashed potatoes.  Foods to avoid include : raw fruits and vegetables and beans., and carbonated beverages.       Take these medicines the morning of surgery with A SIP OF WATER: Amlodipine ( Norvasc0, Zyrtec if needed, Metoprolol ( tprol), Vseicare, Aciphex  DO NOT TAKE ANY DIABETIC MEDICATIONS DAY OF YOUR SURGERY                               You may not have any metal on your body including hair pins and              piercings  Do not wear jewelry, make-up, lotions, powders or perfumes, deodorant             Do not wear nail polish.  Do not shave  48 hours prior to surgery.                Do not bring valuables to the hospital. [place].  Contacts, dentures or bridgework may not be worn into surgery.  Leave suitcase in the car. After surgery it may be brought to your room.       Special Instructions: coughingn and deep breathing exercises, leg exercises               Please read over the following fact sheets you were given: _____________________________________________________________________             [facility] - Preparing for Surgery Before surgery, you can play an important role.  Because skin is not sterile, your skin needs to be as free of germs as possible.  You can reduce the number of germs on your skin by washing with CHG (chlorahexidine gluconate) soap before surgery.  CHG is an antiseptic  cleaner which kills germs and bonds with the skin to continue killing germs even after washing. Please DO NOT use if you have an allergy to CHG or antibacterial soaps.  If your skin becomes reddened/irritated stop using the CHG and inform your nurse when you arrive at Short Stay. Do not shave (including legs and underarms) for at least 48 hours prior to the first CHG shower.  You may shave your face/neck. Please follow these instructions carefully:  1.  Shower with CHG Soap the night before surgery and the  morning of Surgery.  2.  If you choose to wash your hair, wash your hair first as usual with your  normal  shampoo.  3.  After you shampoo, rinse your hair and body thoroughly to remove the  shampoo.  4.  Use CHG as you would any other liquid soap.  You can apply chg directly  to the skin and wash                       Gently with a scrungie or clean washcloth.  5.  Apply the CHG Soap to your body ONLY FROM THE NECK DOWN.   Do not use on face/ open                           Wound or open sores. Avoid contact with eyes, ears mouth and genitals (private parts).                       Wash face,  Genitals (private parts) with your normal soap.             6.  Wash thoroughly, paying special attention to the area where your surgery  will be performed.  7.  Thoroughly rinse your body with warm water from the neck down.  8.  DO NOT shower/wash with your normal soap after using and rinsing off  the CHG Soap.                9.  Pat yourself dry with a clean towel.            10.  Wear clean pajamas.            11.  Place clean sheets on your bed the night of your first shower and do not  sleep with pets. Day of Surgery : Do not apply any lotions/deodorants the morning of surgery.  Please wear clean clothes to the hospital/surgery center.  FAILURE TO FOLLOW THESE INSTRUCTIONS MAY RESULT IN THE CANCELLATION OF YOUR SURGERY PATIENT  SIGNATURE_________________________________  NURSE SIGNATURE__________________________________  ________________________________________________________________________  WHAT IS A BLOOD TRANSFUSION? Blood Transfusion Information  A transfusion is the replacement of blood or some of its parts. Blood is made up of multiple cells which provide different functions.  Red blood cells carry oxygen and are used for blood loss replacement.  White blood cells fight against infection.  Platelets control bleeding.  Plasma helps clot blood.  Other blood products are available for specialized needs, such as hemophilia or other clotting disorders. BEFORE THE TRANSFUSION  Who gives blood for transfusions?   Healthy volunteers who are fully evaluated to make sure their blood is safe. This is blood bank blood. Transfusion therapy is the safest it has ever been in the practice of medicine. Before blood is taken from a donor, a complete history is taken to make sure that person has no history of diseases nor engages in risky social behavior (examples are intravenous drug use or sexual activity with multiple partners). The donor's travel history is screened to minimize risk of transmitting infections, such as malaria. The donated blood is tested for signs of infectious diseases, such as HIV and hepatitis. The blood is then tested to be sure it is compatible with you in order to minimize the chance of a transfusion reaction. If you or a relative donates blood, this is often done in anticipation of surgery and is not appropriate for emergency situations. It takes many days to process the donated blood. RISKS AND COMPLICATIONS Although transfusion therapy is very safe and saves many lives, the main dangers of transfusion include:   Getting an infectious disease.  Developing a transfusion reaction. This  is an allergic reaction to something in the blood you were given. Every precaution is taken to prevent  this. The decision to have a blood transfusion has been considered carefully by your caregiver before blood is given. Blood is not given unless the benefits outweigh the risks. AFTER THE TRANSFUSION  Right after receiving a blood transfusion, you will usually feel much better and more energetic. This is especially true if your red blood cells have gotten low (anemic). The transfusion raises the level of the red blood cells which carry oxygen, and this usually causes an energy increase.  The nurse administering the transfusion will monitor you carefully for complications. HOME CARE INSTRUCTIONS  No special instructions are needed after a transfusion. You may find your energy is better. Speak with your caregiver about any limitations on activity for underlying diseases you may have. SEEK MEDICAL CARE IF:   Your condition is not improving after your transfusion.  You develop redness or irritation at the intravenous (IV) site. SEEK IMMEDIATE MEDICAL CARE IF:  Any of the following symptoms occur over the next 12 hours:  Shaking chills.  You have a temperature by mouth above 102 F (38.9 C), not controlled by medicine.  Chest, back, or muscle pain.  People around you feel you are not acting correctly or are confused.  Shortness of breath or difficulty breathing.  Dizziness and fainting.  You get a rash or develop hives.  You have a decrease in urine output.  Your urine turns a dark color or changes to pink, red, or brown. Any of the following symptoms occur over the next 10 days:  You have a temperature by mouth above 102 F (38.9 C), not controlled by medicine.  Shortness of breath.  Weakness after normal activity.  The white part of the eye turns yellow (jaundice).  You have a decrease in the amount of urine or are urinating less often.  Your urine turns a dark color or changes to pink, red, or brown. Document Released: 01/06/2000 Document Revised: 04/02/2011 Document  Reviewed: 08/25/2007 ExitCare Patient Information 2014 [place].  _______________________________________________________________________  Incentive Spirometer  An incentive spirometer is a tool that can help keep your lungs clear and active. This tool measures how well you are filling your lungs with each breath. Taking long deep breaths may help reverse or decrease the chance of developing breathing (pulmonary) problems (especially infection) following:  A long period of time when you are unable to move or be active. BEFORE THE PROCEDURE   If the spirometer includes an indicator to show your best effort, your nurse or respiratory therapist will set it to a desired goal.  If possible, sit up straight or lean slightly forward. Try not to slouch.  Hold the incentive spirometer in an upright position. INSTRUCTIONS FOR USE  1. Sit on the edge of your bed if possible, or sit up as far as you can in bed or on a chair. 2. Hold the incentive spirometer in an upright position. 3. Breathe out normally. 4. Place the mouthpiece in your mouth and seal your lips tightly around it. 5. Breathe in slowly and as deeply as possible, raising the piston or the ball toward the top of the column. 6. Hold your breath for 3-5 seconds or for as long as possible. Allow the piston or ball to fall to the bottom of the column. 7. Remove the mouthpiece from your mouth and breathe out normally. 8. Rest for a few seconds and repeat Steps 1  through 7 at least 10 times every 1-2 hours when you are awake. Take your time and take a few normal breaths between deep breaths. 9. The spirometer may include an indicator to show your best effort. Use the indicator as a goal to work toward during each repetition. 10. After each set of 10 deep breaths, practice coughing to be sure your lungs are clear. If you have an incision (the cut made at the time of surgery), support your incision when coughing by placing a pillow or  rolled up towels firmly against it. Once you are able to get out of bed, walk around indoors and cough well. You may stop using the incentive spirometer when instructed by your caregiver.  RISKS AND COMPLICATIONS  Take your time so you do not get dizzy or light-headed.  If you are in pain, you may need to take or ask for pain medication before doing incentive spirometry. It is harder to take a deep breath if you are having pain. AFTER USE  Rest and breathe slowly and easily.  It can be helpful to keep track of a log of your progress. Your caregiver can provide you with a simple table to help with this. If you are using the spirometer at home, follow these instructions: [place] IF:   You are having difficultly using the spirometer.  You have trouble using the spirometer as often as instructed.  Your pain medication is not giving enough relief while using the spirometer.  You develop fever of 100.5 F (38.1 C) or higher. SEEK IMMEDIATE MEDICAL CARE IF:   You cough up bloody sputum that had not been present before.  You develop fever of 102 F (38.9 C) or greater.  You develop worsening pain at or near the incision site. MAKE SURE YOU:   Understand these instructions.  Will watch your condition.  Will get help right away if you are not doing well or get worse. Document Released: 05/21/2006 Document Revised: 04/02/2011 Document Reviewed: 07/22/2006 [facility] Patient Information 2014 [place], [place].   ________________________________________________________________________

## 2015-08-23 ENCOUNTER — Encounter (HOSPITAL_COMMUNITY)
Admission: RE | Admit: 2015-08-23 | Discharge: 2015-08-23 | Disposition: A | Discharge status: Home / Self Care | Payer: Medicare Other | Source: Ambulatory Visit | Attending: Gynecologic Oncology | Admitting: Gynecologic Oncology

## 2015-08-23 ENCOUNTER — Encounter (HOSPITAL_COMMUNITY): Payer: Self-pay

## 2015-08-23 DIAGNOSIS — G4733 Obstructive sleep apnea (adult) (pediatric): Secondary | ICD-10-CM | POA: Diagnosis not present

## 2015-08-23 DIAGNOSIS — N83201 Unspecified ovarian cyst, right side: Secondary | ICD-10-CM | POA: Diagnosis present

## 2015-08-23 DIAGNOSIS — I10 Essential (primary) hypertension: Secondary | ICD-10-CM | POA: Diagnosis not present

## 2015-08-23 DIAGNOSIS — Z79899 Other long term (current) drug therapy: Secondary | ICD-10-CM | POA: Diagnosis not present

## 2015-08-23 DIAGNOSIS — Z8041 Family history of malignant neoplasm of ovary: Secondary | ICD-10-CM | POA: Diagnosis not present

## 2015-08-23 DIAGNOSIS — N838 Other noninflammatory disorders of ovary, fallopian tube and broad ligament: Secondary | ICD-10-CM | POA: Diagnosis not present

## 2015-08-23 DIAGNOSIS — Z7984 Long term (current) use of oral hypoglycemic drugs: Secondary | ICD-10-CM | POA: Diagnosis not present

## 2015-08-23 DIAGNOSIS — M1990 Unspecified osteoarthritis, unspecified site: Secondary | ICD-10-CM | POA: Diagnosis not present

## 2015-08-23 DIAGNOSIS — E119 Type 2 diabetes mellitus without complications: Secondary | ICD-10-CM | POA: Diagnosis not present

## 2015-08-23 HISTORY — DX: Anxiety disorder, unspecified: F41.9

## 2015-08-23 HISTORY — DX: Family history of other specified conditions: Z84.89

## 2015-08-23 LAB — CBC WITH DIFFERENTIAL/PLATELET
Basophils Absolute: 0 10*3/uL (ref 0.0–0.1)
Basophils Relative: 0 %
Eosinophils Absolute: 0.1 10*3/uL (ref 0.0–0.7)
Eosinophils Relative: 2 %
HCT: 33.9 % — ABNORMAL LOW (ref 36.0–46.0)
Hemoglobin: 11.1 g/dL — ABNORMAL LOW (ref 12.0–15.0)
Lymphocytes Relative: 38 %
Lymphs Abs: 1.3 10*3/uL (ref 0.7–4.0)
MCH: 26 pg (ref 26.0–34.0)
MCHC: 32.7 g/dL (ref 30.0–36.0)
MCV: 79.4 fL (ref 78.0–100.0)
Monocytes Absolute: 0.2 10*3/uL (ref 0.1–1.0)
Monocytes Relative: 6 %
Neutro Abs: 1.8 10*3/uL (ref 1.7–7.7)
Neutrophils Relative %: 54 %
Platelets: 213 10*3/uL (ref 150–400)
RBC: 4.27 MIL/uL (ref 3.87–5.11)
RDW: 13.6 % (ref 11.5–15.5)
WBC: 3.4 10*3/uL — ABNORMAL LOW (ref 4.0–10.5)

## 2015-08-23 LAB — COMPREHENSIVE METABOLIC PANEL
ALT: 14 U/L (ref 14–54)
AST: 20 U/L (ref 15–41)
Albumin: 4.2 g/dL (ref 3.5–5.0)
Alkaline Phosphatase: 109 U/L (ref 38–126)
Anion gap: 6 (ref 5–15)
BUN: 22 mg/dL — ABNORMAL HIGH (ref 6–20)
CO2: 28 mmol/L (ref 22–32)
Calcium: 9 mg/dL (ref 8.9–10.3)
Chloride: 104 mmol/L (ref 101–111)
Creatinine, Ser: 0.93 mg/dL (ref 0.44–1.00)
GFR calc Af Amer: >60 mL/min (ref >60)
GFR calc non Af Amer: 60 mL/min — ABNORMAL LOW (ref >60)
Glucose, Bld: 87 mg/dL (ref 65–99)
Potassium: 3.9 mmol/L (ref 3.5–5.1)
Sodium: 138 mmol/L (ref 135–145)
Total Bilirubin: 0.7 mg/dL (ref 0.3–1.2)
Total Protein: 6.6 g/dL (ref 6.5–8.1)

## 2015-08-23 LAB — URINALYSIS, ROUTINE W REFLEX MICROSCOPIC
Bilirubin Urine: NEGATIVE
Glucose, UA: NEGATIVE mg/dL
Hgb urine dipstick: NEGATIVE
Ketones, ur: NEGATIVE mg/dL
Leukocytes, UA: NEGATIVE
Nitrite: NEGATIVE
Protein, ur: NEGATIVE mg/dL
Specific Gravity, Urine: 1.01 (ref 1.005–1.030)
pH: 6.5 (ref 5.0–8.0)

## 2015-08-23 NOTE — Progress Notes (Signed)
CMp done 08/23/15 faxed via EPIC to Dr Skeet Latch and Lenna Sciara cross,NP.

## 2015-08-23 NOTE — Progress Notes (Signed)
Final EKG done 08/23/15- EPIC

## 2015-08-24 LAB — HEMOGLOBIN A1C
Hgb A1c MFr Bld: 6.3 % — ABNORMAL HIGH (ref 4.8–5.6)
Mean Plasma Glucose: 134 mg/dL

## 2015-08-24 LAB — ABO/RH: ABO/RH(D): O POS

## 2015-08-25 ENCOUNTER — Inpatient Hospital Stay (HOSPITAL_COMMUNITY): Payer: Medicare Other | Admitting: Anesthesiology

## 2015-08-25 ENCOUNTER — Encounter (HOSPITAL_COMMUNITY): Payer: Self-pay | Admitting: *Deleted

## 2015-08-25 ENCOUNTER — Ambulatory Visit (HOSPITAL_COMMUNITY)
Admission: RE | Admit: 2015-08-25 | Discharge: 2015-08-25 | Disposition: A | Discharge status: Home / Self Care | Payer: Medicare Other | Source: Ambulatory Visit | Attending: Obstetrics & Gynecology | Admitting: Obstetrics & Gynecology

## 2015-08-25 ENCOUNTER — Encounter (HOSPITAL_COMMUNITY)
Admission: RE | Disposition: A | Discharge status: Home / Self Care | Payer: Self-pay | Source: Ambulatory Visit | Attending: Obstetrics & Gynecology

## 2015-08-25 DIAGNOSIS — N83291 Other ovarian cyst, right side: Secondary | ICD-10-CM

## 2015-08-25 DIAGNOSIS — Z8041 Family history of malignant neoplasm of ovary: Secondary | ICD-10-CM | POA: Insufficient documentation

## 2015-08-25 DIAGNOSIS — N83201 Unspecified ovarian cyst, right side: Secondary | ICD-10-CM | POA: Insufficient documentation

## 2015-08-25 DIAGNOSIS — Z7984 Long term (current) use of oral hypoglycemic drugs: Secondary | ICD-10-CM | POA: Insufficient documentation

## 2015-08-25 DIAGNOSIS — M1990 Unspecified osteoarthritis, unspecified site: Secondary | ICD-10-CM | POA: Diagnosis not present

## 2015-08-25 DIAGNOSIS — N838 Other noninflammatory disorders of ovary, fallopian tube and broad ligament: Secondary | ICD-10-CM | POA: Diagnosis not present

## 2015-08-25 DIAGNOSIS — E119 Type 2 diabetes mellitus without complications: Secondary | ICD-10-CM | POA: Insufficient documentation

## 2015-08-25 DIAGNOSIS — G4733 Obstructive sleep apnea (adult) (pediatric): Secondary | ICD-10-CM | POA: Insufficient documentation

## 2015-08-25 DIAGNOSIS — I10 Essential (primary) hypertension: Secondary | ICD-10-CM | POA: Insufficient documentation

## 2015-08-25 DIAGNOSIS — N83299 Other ovarian cyst, unspecified side: Secondary | ICD-10-CM

## 2015-08-25 DIAGNOSIS — Z79899 Other long term (current) drug therapy: Secondary | ICD-10-CM | POA: Insufficient documentation

## 2015-08-25 HISTORY — PX: ROBOTIC ASSISTED BILATERAL SALPINGO OOPHERECTOMY: SHX6078

## 2015-08-25 LAB — TYPE AND SCREEN
ABO/RH(D): O POS
Antibody Screen: NEGATIVE

## 2015-08-25 LAB — GLUCOSE, CAPILLARY
Glucose-Capillary: 103 mg/dL — ABNORMAL HIGH (ref 65–99)
Glucose-Capillary: 151 mg/dL — ABNORMAL HIGH (ref 65–99)

## 2015-08-25 SURGERY — SALPINGO-OOPHORECTOMY, BILATERAL, ROBOT-ASSISTED
Anesthesia: General

## 2015-08-25 MED ORDER — PROPOFOL 10 MG/ML IV BOLUS
INTRAVENOUS | Status: DC | PRN
  Administered 2015-08-25: 50 mg via INTRAVENOUS
  Administered 2015-08-25: 100 mg via INTRAVENOUS

## 2015-08-25 MED ORDER — ONDANSETRON HCL 4 MG/2ML IJ SOLN
INTRAMUSCULAR | Status: AC
  Filled 2015-08-25: qty 2

## 2015-08-25 MED ORDER — DEXAMETHASONE SODIUM PHOSPHATE 10 MG/ML IJ SOLN
INTRAMUSCULAR | Status: AC
  Filled 2015-08-25: qty 1

## 2015-08-25 MED ORDER — SODIUM CHLORIDE 0.9 % IV SOLN: 250.0000 mL | INTRAVENOUS | Status: DC | PRN

## 2015-08-25 MED ORDER — LIDOCAINE HCL (CARDIAC) 20 MG/ML IV SOLN
INTRAVENOUS | Status: AC
  Filled 2015-08-25: qty 5

## 2015-08-25 MED ORDER — LACTATED RINGERS IV SOLN
INTRAVENOUS | Status: DC | PRN
  Administered 2015-08-25: 07:00:00 via INTRAVENOUS

## 2015-08-25 MED ORDER — ACETAMINOPHEN 325 MG PO TABS
650.0000 mg | ORAL_TABLET | ORAL | Status: DC | PRN
Start: 2015-08-25 — End: 2015-08-25

## 2015-08-25 MED ORDER — SUGAMMADEX SODIUM 200 MG/2ML IV SOLN
INTRAVENOUS | Status: AC
  Filled 2015-08-25: qty 2

## 2015-08-25 MED ORDER — LABETALOL HCL 5 MG/ML IV SOLN
5.0000 mg | Freq: Once | INTRAVENOUS | Status: AC
  Administered 2015-08-25: 5 mg via INTRAVENOUS

## 2015-08-25 MED ORDER — CIPROFLOXACIN IN D5W 400 MG/200ML IV SOLN
INTRAVENOUS | Status: AC
  Filled 2015-08-25: qty 200

## 2015-08-25 MED ORDER — LACTATED RINGERS IV SOLN
INTRAVENOUS | Status: DC
  Administered 2015-08-25: 10:00:00 via INTRAVENOUS

## 2015-08-25 MED ORDER — CIPROFLOXACIN IN D5W 400 MG/200ML IV SOLN
400.0000 mg | INTRAVENOUS | Status: AC
  Administered 2015-08-25: 400 mg via INTRAVENOUS

## 2015-08-25 MED ORDER — LIDOCAINE HCL (CARDIAC) 20 MG/ML IV SOLN
INTRAVENOUS | Status: DC | PRN
  Administered 2015-08-25: 60 mg via INTRATRACHEAL

## 2015-08-25 MED ORDER — FENTANYL CITRATE (PF) 100 MCG/2ML IJ SOLN
25.0000 ug | INTRAMUSCULAR | Status: DC | PRN
  Administered 2015-08-25: 50 ug via INTRAVENOUS

## 2015-08-25 MED ORDER — LACTATED RINGERS IR SOLN
Status: DC | PRN
  Administered 2015-08-25: 1000 mL

## 2015-08-25 MED ORDER — PHENYLEPHRINE 40 MCG/ML (10ML) SYRINGE FOR IV PUSH (FOR BLOOD PRESSURE SUPPORT)
PREFILLED_SYRINGE | INTRAVENOUS | Status: AC
  Filled 2015-08-25: qty 10

## 2015-08-25 MED ORDER — CLINDAMYCIN PHOSPHATE 900 MG/50ML IV SOLN
INTRAVENOUS | Status: AC
  Filled 2015-08-25: qty 50

## 2015-08-25 MED ORDER — STERILE WATER FOR IRRIGATION IR SOLN
Status: DC | PRN
  Administered 2015-08-25: 1000 mL

## 2015-08-25 MED ORDER — LABETALOL HCL 5 MG/ML IV SOLN
INTRAVENOUS | Status: AC
  Filled 2015-08-25: qty 4

## 2015-08-25 MED ORDER — ROCURONIUM BROMIDE 100 MG/10ML IV SOLN
INTRAVENOUS | Status: AC
  Filled 2015-08-25: qty 1

## 2015-08-25 MED ORDER — ROCURONIUM BROMIDE 100 MG/10ML IV SOLN
INTRAVENOUS | Status: DC | PRN
  Administered 2015-08-25: 20 mg via INTRAVENOUS
  Administered 2015-08-25: 50 mg via INTRAVENOUS

## 2015-08-25 MED ORDER — DEXAMETHASONE SODIUM PHOSPHATE 10 MG/ML IJ SOLN
INTRAMUSCULAR | Status: DC | PRN
  Administered 2015-08-25: 10 mg via INTRAVENOUS

## 2015-08-25 MED ORDER — FENTANYL CITRATE (PF) 100 MCG/2ML IJ SOLN
INTRAMUSCULAR | Status: AC
  Filled 2015-08-25: qty 2

## 2015-08-25 MED ORDER — SUGAMMADEX SODIUM 200 MG/2ML IV SOLN
INTRAVENOUS | Status: DC | PRN
  Administered 2015-08-25: 200 mg via INTRAVENOUS

## 2015-08-25 MED ORDER — PHENYLEPHRINE HCL 10 MG/ML IJ SOLN
INTRAMUSCULAR | Status: DC | PRN
  Administered 2015-08-25: 80 ug via INTRAVENOUS

## 2015-08-25 MED ORDER — TRAMADOL HCL 50 MG PO TABS: 50.0000 mg | ORAL_TABLET | Freq: Four times a day (QID) | ORAL | 0 refills | Status: DC | PRN

## 2015-08-25 MED ORDER — ACETAMINOPHEN 650 MG RE SUPP
650.0000 mg | RECTAL | Status: DC | PRN
  Filled 2015-08-25: qty 1

## 2015-08-25 MED ORDER — TRAMADOL HCL 50 MG PO TABS: 50.0000 mg | ORAL_TABLET | Freq: Four times a day (QID) | ORAL | Status: DC | PRN

## 2015-08-25 MED ORDER — FENTANYL CITRATE (PF) 250 MCG/5ML IJ SOLN
INTRAMUSCULAR | Status: AC
  Filled 2015-08-25: qty 5

## 2015-08-25 MED ORDER — ONDANSETRON HCL 4 MG/2ML IJ SOLN
INTRAMUSCULAR | Status: DC | PRN
  Administered 2015-08-25: 4 mg via INTRAVENOUS

## 2015-08-25 MED ORDER — CLINDAMYCIN PHOSPHATE 900 MG/50ML IV SOLN
900.0000 mg | INTRAVENOUS | Status: AC
  Administered 2015-08-25: 900 mg via INTRAVENOUS

## 2015-08-25 MED ORDER — PROPOFOL 10 MG/ML IV BOLUS
INTRAVENOUS | Status: AC
  Filled 2015-08-25: qty 20

## 2015-08-25 MED ORDER — PROMETHAZINE HCL 25 MG/ML IJ SOLN: 6.2500 mg | INTRAMUSCULAR | Status: DC | PRN

## 2015-08-25 MED ORDER — FENTANYL CITRATE (PF) 250 MCG/5ML IJ SOLN
INTRAMUSCULAR | Status: DC | PRN
  Administered 2015-08-25: 100 ug via INTRAVENOUS
  Administered 2015-08-25 (×3): 50 ug via INTRAVENOUS

## 2015-08-25 SURGICAL SUPPLY — 47 items
BAG SPEC RTRVL LRG 6X4 10 (ENDOMECHANICALS) ×2
CHLORAPREP W/TINT 26ML (MISCELLANEOUS) ×2 IMPLANT
COVER SURGICAL LIGHT HANDLE (MISCELLANEOUS) ×1 IMPLANT
COVER TIP SHEARS 8 DVNC (MISCELLANEOUS) ×1 IMPLANT
COVER TIP SHEARS 8MM DA VINCI (MISCELLANEOUS) ×1
DECANTER SPIKE VIAL GLASS SM (MISCELLANEOUS) ×1 IMPLANT
DRAPE ARM DVNC X/XI (DISPOSABLE) ×4 IMPLANT
DRAPE COLUMN DVNC XI (DISPOSABLE) ×1 IMPLANT
DRAPE DA VINCI XI ARM (DISPOSABLE) ×5
DRAPE DA VINCI XI COLUMN (DISPOSABLE) ×1
DRAPE SHEET LG 3/4 BI-LAMINATE (DRAPES) ×5 IMPLANT
DRAPE SURG IRRIG POUCH 19X23 (DRAPES) ×2 IMPLANT
ELECT REM PT RETURN 9FT ADLT (ELECTROSURGICAL) ×2
ELECTRODE REM PT RTRN 9FT ADLT (ELECTROSURGICAL) ×1 IMPLANT
GLOVE BIO SURGEON STRL SZ 6.5 (GLOVE) ×8 IMPLANT
GLOVE BIO SURGEON STRL SZ7.5 (GLOVE) ×4 IMPLANT
GLOVE BIOGEL PI IND STRL 8 (GLOVE) ×2 IMPLANT
GLOVE BIOGEL PI INDICATOR 8 (GLOVE) ×2
GOWN STRL REUS W/ TWL XL LVL3 (GOWN DISPOSABLE) ×2 IMPLANT
GOWN STRL REUS W/TWL XL LVL3 (GOWN DISPOSABLE) ×4
HOLDER FOLEY CATH W/STRAP (MISCELLANEOUS) ×2 IMPLANT
IRRIG SUCT STRYKERFLOW 2 WTIP (MISCELLANEOUS) ×2
IRRIGATION SUCT STRKRFLW 2 WTP (MISCELLANEOUS) ×1 IMPLANT
LIQUID BAND (GAUZE/BANDAGES/DRESSINGS) ×2 IMPLANT
MANIPULATOR UTERINE 4.5 ZUMI (MISCELLANEOUS) IMPLANT
OCCLUDER COLPOPNEUMO (BALLOONS) IMPLANT
PAD POSITIONING PINK XL (MISCELLANEOUS) ×2 IMPLANT
PORT ACCESS TROCAR AIRSEAL 12 (TROCAR) ×1 IMPLANT
PORT ACCESS TROCAR AIRSEAL 5M (TROCAR) ×1
POUCH SPECIMEN RETRIEVAL 10MM (ENDOMECHANICALS) ×2 IMPLANT
SCISSORS LAP 5X35 DISP (ENDOMECHANICALS) ×1 IMPLANT
SEAL CANN UNIV 5-8 DVNC XI (MISCELLANEOUS) ×4 IMPLANT
SEAL XI 5MM-8MM UNIVERSAL (MISCELLANEOUS) ×4
SET TRI-LUMEN FLTR TB AIRSEAL (TUBING) ×2 IMPLANT
SHEET LAVH (DRAPES) ×2 IMPLANT
SOLUTION ELECTROLUBE (MISCELLANEOUS) ×2 IMPLANT
SPONGE LAP 18X18 X RAY DECT (DISPOSABLE) IMPLANT
SUT MNCRL AB 4-0 PS2 18 (SUTURE) ×5 IMPLANT
SUT VIC AB 0 CT1 27 (SUTURE)
SUT VIC AB 0 CT1 27XBRD ANTBC (SUTURE) IMPLANT
TOWEL OR 17X26 10 PK STRL BLUE (TOWEL DISPOSABLE) ×4 IMPLANT
TRAP SPECIMEN MUCOUS 40CC (MISCELLANEOUS) ×1 IMPLANT
TRAY FOLEY W/METER SILVER 14FR (SET/KITS/TRAYS/PACK) ×2 IMPLANT
TRAY LAPAROSCOPIC (CUSTOM PROCEDURE TRAY) ×2 IMPLANT
TROCAR BLADELESS OPT 5 100 (ENDOMECHANICALS) ×2 IMPLANT
UNDERPAD 30X30 INCONTINENT (UNDERPADS AND DIAPERS) ×2 IMPLANT
WATER STERILE IRR 1500ML POUR (IV SOLUTION) ×3 IMPLANT

## 2015-08-25 NOTE — Op Note (Signed)
OPERATIVE NOTE  Preoperative Diagnosis: Right adnexal mass  Postoperative Diagnosis: Benign right adnexal mass  Procedure(s) Performed: Robotic total laparoscopic bilateral salpingo oophorectomy, pelvic washings   Anesthesia: Gen. endotracheal  Surgeon: Francetta Found.  Skeet Latch, M.D. PhD  Assistant Surgeon: Lahoma Crocker MD.   Specimens: Pelvic washings bilateral tubes and ovaries   Estimated Blood Loss: Minimal mL.   Complications: None  Indication for Procedure: This is a 75 y.o. with a right adnexal mass increasing in size, normal CA 125  Operative Findings:Right  adnexa with excrescences over the fimbria no evidence of ascites normal-appearing appendix omentum liver surface and diaphragmatic surfaces. No evidence of metastatic disease.    Procedure: Patient was taken to the operating room and placed under general endotracheal anesthesia without any difficulty. She is placed in the dorsal lithotomy position and secured to the operative table over the chest with tape.   The patient was prepped and draped.  An OG tube was present and functional. At an area on the left in line with the nipple approximately 2 cm below the ribs a skin incision was made and a 5 mm Optiview inserted under direct visualization. The abdomen was insufflated to 15 mm of mercury and the pressure never deviated above that throughout the remainder of the procedure. Maximum Trendelenburg positioning was obtained. At approximately 22 cm proximal to the symphysis pubis an incision was made just superior to the umbilicus.  8cm lateral to this midline incision 8 mm Millimeter robotic ports were placed .  A RUQ port was inserted.   The left upper quadrant port site was replaced with a 62mm port. This was all completed under direct visualization. The small and large bowel were reflected as much as possible into the upper abdomen. The robot was docked and instruments placed.  Washings were collected.    The right peritoneum  was transected just lateral to the infundibulopelvic ligament and the ureter was identified. The right infundibulopelvic ligament was cauterized and transected The right adnexa was placed in Endo Catch bag. In a similar manner the left salpingo-oophorectomy was performed. The areas of surgical dissection were inspected and hemostasis was assured. The pelvis was irrigated and drained. The robot was undocked and the specimens were delivered through the left upper quadrant port.  The specimens were delivered through the RUQ port.  Frozen section returned c/w benign adnexal mass.     The instruments were removed from the abdomen and pelvis and the port sites irrigated. The fascia of the right upper quadrant was closed with an interrupted 0 Vicryl  suture. All skin incisions were closed with a subcuticular suture.  Sponge, lap and needle counts were correct x 3.    The patient had sequential compression devices.         Disposition: PACU - hemodynamically stable.         Condition:stable Foley draining clear urine.

## 2015-08-25 NOTE — Transfer of Care (Signed)
Immediate Anesthesia Transfer of Care Note  Patient: Dawn Salas  Procedure(s) Performed: Procedure(s): XI ROBOTIC ASSISTED BILATERAL SALPINGO OOPHORECTOMY (N/A)  Patient Location: PACU  Anesthesia Type:General  Level of Consciousness: awake, alert , oriented and patient cooperative  Airway & Oxygen Therapy: Patient Spontanous Breathing and Patient connected to face mask oxygen  Post-op Assessment: Report given to RN and Post -op Vital signs reviewed and stable  Post vital signs: Reviewed and stable  Last Vitals:  Vitals:   08/25/15 0531  BP: (!) 141/73  Pulse: 72  Resp: 16  Temp: 36.6 C    Last Pain:  Vitals:   08/25/15 0531  TempSrc: Oral         Complications: No apparent anesthesia complications

## 2015-08-25 NOTE — Anesthesia Preprocedure Evaluation (Addendum)
Anesthesia Evaluation  Patient identified by MRN, date of birth, ID band Patient awake    Reviewed: Allergy & Precautions, NPO status , Patient's Chart, lab work & pertinent test results  Airway Mallampati: II  TM Distance: >3 FB Neck ROM: Full    Dental  (+) Edentulous Upper, Dental Advisory Given   Pulmonary asthma , sleep apnea ,    breath sounds clear to auscultation       Cardiovascular hypertension,  Rhythm:Regular Rate:Normal     Neuro/Psych    GI/Hepatic Neg liver ROS, hiatal hernia, PUD, GERD  ,  Endo/Other  diabetesHyperthyroidism (Treated and currently under control)   Renal/GU negative Renal ROS     Musculoskeletal  (+) Arthritis ,   Abdominal   Peds  Hematology negative hematology ROS (+)   Anesthesia Other Findings   Reproductive/Obstetrics                            Lab Results  Component Value Date   WBC 3.4 (L) 08/23/2015   HGB 11.1 (L) 08/23/2015   HCT 33.9 (L) 08/23/2015   MCV 79.4 08/23/2015   PLT 213 08/23/2015   Lab Results  Component Value Date   CREATININE 0.93 08/23/2015   BUN 22 (H) 08/23/2015   NA 138 08/23/2015   K 3.9 08/23/2015   CL 104 08/23/2015   CO2 28 08/23/2015    Anesthesia Physical Anesthesia Plan  ASA: II  Anesthesia Plan: General   Post-op Pain Management:    Induction: Intravenous  Airway Management Planned: Oral ETT  Additional Equipment:   Intra-op Plan:   Post-operative Plan: Extubation in OR  Informed Consent: I have reviewed the patients History and Physical, chart, labs and discussed the procedure including the risks, benefits and alternatives for the proposed anesthesia with the patient or authorized representative who has indicated his/her understanding and acceptance.   Dental advisory given  Plan Discussed with: CRNA  Anesthesia Plan Comments:         Anesthesia Quick Evaluation

## 2015-08-25 NOTE — H&P (View-Only) (Signed)
Consult Note: Gyn-Onc   Dawn Salas 75 y.o. female  Chief Complaint  Patient presents with  . Complex ovarian cyst    New Consultation    Assessment :Complex right ovarian cyst which has rapidly increased in size in the last 2 months.  Plan: I would recommend the patient undergo laparoscopic bilateral salpingo-oophorectomy with intraoperative frozen section. Should this be a malignancy, the patient wishes to proceed with exploratory laparotomy and surgical staging under the same anesthetic.  The risks of surgery were reviewed and questions answered. Patient understands that the primary surgeon be Dr. [name] is available that day.     HPI: 75 year old African-American female gravida 6 seen in consultation at the request of Dr. [name] regarding management of a right ovarian cyst which is increasing in size. Because of a family history of ovarian cancer Dr. cousins obtained an ultrasound on 06/14/2015 revealing a 7 mm complex right ovarian cyst. The ultrasound was repeated on 08/17/2015 and now reveals a 3.5 x 2.3 x 1.8 cm complex cyst showing slight vascularity and calcifications. Left ovary appears normal. Patient has had CA-125 value consider 10 units per mL.  The patient's mother at age 106 developed ovarian cancer. The patient also reports that she had a maternal aunt with ovarian and pancreatic cancer. There is no apparent breast cancer in the family.  The patient's previously had a hysterectomy at age 42 for fibroids. She is also had a tubal ligation prior to that time. She has had several operations for meningiomas and is currently being monitored with serial MRI studies. She's also had number of other surgical procedures that have been successfully performed.  Review of Systems:10 point review of systems is negative except as noted in interval history.   Vitals: Blood pressure (!) 148/81, pulse 80, temperature 97.7 F (36.5 C), resp. rate 18, height 5\' 4"  (1.626 m),  weight 207 lb (93.9 kg), SpO2 98 %.  Physical Exam: General : The patient is a healthy woman in no acute distress.  HEENT: normocephalic, extraoccular movements normal; neck is supple without thyromegally  Lynphnodes: Supraclavicular and inguinal nodes not enlarged  Abdomen: Soft, non-tender, no ascites, no organomegally, no masses, no hernias  Pelvic:  EGBUS: Normal female  Vagina: Normal, no lesions  Urethra and Bladder: Normal, non-tender  Cervix: Surgically absent  Uterus: Surgically absent  Bi-manual examination: Non-tender; no adenxal masses or nodularity  Rectal: normal sphincter tone, no masses, no blood  Lower extremities: No edema or varicosities. Normal range of motion      Allergies  Allergen Reactions  . Flagyl [Metronidazole] Other (See Comments)    Caused increased heart rate  . Aspirin Effervescent Swelling    Alka-Seltzer gel caps- sore mouth, face swollen, turned hands white  . Morphine Sulfate     REACTION: hives  . Penicillins     REACTION: rash    Past Medical History:  Diagnosis Date  . Allergy   . Arthritis   . Asthma   . Depression   . Diabetes mellitus    Type two  . Diverticulitis   . Frequency of urination   . GERD (gastroesophageal reflux disease)   . History of hiatal hernia   . Hyperglycemia   . Hyperlipidemia   . Hypertension   . Hyperthyroidism    PT HAD BIPOSY -NEGATIVE.Marland Kitchen AND NOW JUST GETS CHECKED EACH YEAR .Marland Kitchen 2013 LAST TEST   SEES DR. [name].   . Multiple meningiomas of spine and brain ([place])   . Obstructive  sleep apnea    STUDY AT [facility] DOES NOT USE C PAP  . Palpitations   . Peptic ulcer disease   . Pneumonia    "walking pneumonia" hx of  . Seizures ([place])    has seizure after having second brain surgery  . Shortness of breath dyspnea    Has SOB with palpitations takes Metoprolol    Past Surgical History:  Procedure Laterality Date  . ABDOMINAL HYSTERECTOMY    . BACK SURGERY     L SPIN 2003   NECK 2004  . BRAIN  MENINGIOMA EXCISION  8/10   X 2  . C5-C6 neck fusion    . COLONOSCOPY    . CRANIOTOMY  08/13/2011   Procedure: CRANIOTOMY TUMOR EXCISION;  Surgeon: [name], MD;  Location: [facility];  Service: Neurosurgery;  Laterality: Bilateral;  Bifrontal craniotomy for tumor  . DILATION AND CURETTAGE OF UTERUS     YEARS AGO  . L4-L5 posterior la    . left foot plantar and hammertoe    . right foot bunectomy    . right knee arthroscopy     x 3  . right shoulder rotator cuff    . TONSILLECTOMY    . TUBAL LIGATION    . WRIST SURGERY Right     Current Outpatient Prescriptions  Medication Sig Dispense Refill  . amLODipine (NORVASC) 2.5 MG tablet Take 1 tablet (2.5 mg total) by mouth daily. 90 tablet 3  . cetirizine (ZYRTEC) 10 MG tablet Take 10 mg by mouth daily as needed. For allergies    . Cyanocobalamin (VITAMIN B12 PO) Take 1 mL by mouth daily. OTC    . furosemide (LASIX) 40 MG tablet Take 0.5 tablets (20 mg total) by mouth daily as needed. For fluid 30 tablet 3  . glucose blood (ACCU-CHEK AVIVA PLUS) test strip 1 each by Other route 2 (two) times daily. E11.9 100 each 6  . Iron-Vitamins (GERITOL COMPLETE PO) Take 1 tablet by mouth daily.    [name] Devices (ACCU-CHEK SOFTCLIX) lancets 1 each by Other route 2 (two) times daily. E11.9 100 each 3  . losartan-hydrochlorothiazide (HYZAAR) 100-12.5 MG tablet TAKE 1 TABLET DAILY 90 tablet 2  . metFORMIN (GLUCOPHAGE) 500 MG tablet TAKE ONE TABLET TWICE A DAY WITH FOOD 60 tablet 5  . methimazole (TAPAZOLE) 5 MG tablet Take 5 mg by mouth daily.     . metoprolol succinate (TOPROL-XL) 100 MG 24 hr tablet TAKE ONE (1) TABLET EACH DAY 30 tablet 5  . potassium chloride (K-DUR) 10 MEQ tablet TAKE ONE (1) TABLET EACH DAY 30 tablet 11  . RABEprazole (ACIPHEX) 20 MG tablet TAKE ONE (1) TABLET EACH DAY 90 tablet 1  . Specialty Vitamins Products (MAGNESIUM, AMINO ACID CHELATE,) 133 MG tablet Take 1 tablet by mouth 2 (two) times daily. TAKES 250MG  DAILY.     . valACYclovir (VALTREX) 1000 MG tablet TAKE 2 TABLETS EVERY 12 HOURS FOR 1 DAY,AND THEN AS NEEDED 30 tablet 1  . VESICARE 5 MG tablet TAKE 1 TABLET DAILY 90 tablet 1  . promethazine (PHENERGAN) 25 MG tablet Take 1 tablet (25 mg total) by mouth every 6 (six) hours as needed for nausea or vomiting. (Patient not taking: Reported on 08/19/2015) 12 tablet 0   No current facility-administered medications for this visit.     Social History   Social History  . Marital status: Married    Spouse name: N/A  . Number of children: N/A  .  Years of education: N/A   Occupational History  . Not on file.   Social History Main Topics  . Smoking status: Never Smoker  . Smokeless tobacco: Never Used  . Alcohol use No  . Drug use: No  . Sexual activity: No   Other Topics Concern  . Not on file   Social History Narrative  . No narrative on file    Family History  Problem Relation Age of Onset  . Cancer Mother     ovarian  . Heart disease Father 62  . Arthritis Other   . Hyperlipidemia Other   . Hypertension Other   . Diabetes Other   . Anesthesia problems Daughter     NAUSEA AND VOMITING POST OP      Marti Sleigh, MD 08/19/2015, 11:58 AM

## 2015-08-25 NOTE — Anesthesia Procedure Notes (Signed)
Procedure Name: Intubation Date/Time: 08/25/2015 7:44 AM Performed by: Dione Booze Pre-anesthesia Checklist: Patient identified, Emergency Drugs available and Suction available Patient Re-evaluated:Patient Re-evaluated prior to inductionOxygen Delivery Method: Circle system utilized Preoxygenation: Pre-oxygenation with 100% oxygen Intubation Type: IV induction Ventilation: Mask ventilation without difficulty Laryngoscope Size: Mac and 4 Grade View: Grade I Tube type: Oral Tube size: 7.5 mm Number of attempts: 1 Airway Equipment and Method: Stylet Placement Confirmation: ETT inserted through vocal cords under direct vision,  positive ETCO2 and breath sounds checked- equal and bilateral Secured at: 22 cm Tube secured with: Tape Dental Injury: Teeth and Oropharynx as per pre-operative assessment

## 2015-08-25 NOTE — Anesthesia Postprocedure Evaluation (Signed)
Anesthesia Post Note  Patient: Dawn Salas  Procedure(s) Performed: Procedure(s) (LRB): XI ROBOTIC ASSISTED BILATERAL SALPINGO OOPHORECTOMY (N/A)  Patient location during evaluation: PACU Anesthesia Type: General Level of consciousness: awake and alert, oriented and awake Pain management: pain level controlled Vital Signs Assessment: post-procedure vital signs reviewed and stable Respiratory status: spontaneous breathing, nonlabored ventilation and respiratory function stable Cardiovascular status: stable Anesthetic complications: no    Last Vitals:  Vitals:   08/25/15 1115 08/25/15 1137  BP:  (!) 155/75  Pulse:  85  Resp: 12 14  Temp: 36.8 C 36.7 C    Last Pain:  Vitals:   08/25/15 1115  TempSrc:   PainSc: 4                  Audine Mangione Minda Meo

## 2015-08-25 NOTE — Interval H&P Note (Signed)
History and Physical Interval Note:  08/25/2015 7:31 AM  Dawn Salas  has presented today for surgery, with the diagnosis of COMPLEX OVARIAN CYST  The various methods of treatment have been discussed with the patient and family. After consideration of risks, benefits and other options for treatment, the patient has consented to  Procedure(s): XI ROBOTIC [place] (N/A) POSSIBLE LAPAROTOMY (N/A) as a surgical intervention .  The patient's history has been reviewed, patient examined, no change in status, stable for surgery.  I have reviewed the patient's chart and labs.  Questions were answered to the patient's satisfaction.     [place], [facility]

## 2015-08-25 NOTE — Progress Notes (Signed)
PACU: OK to go to Short Stay now per Dr. Ola Spurr

## 2015-08-25 NOTE — Progress Notes (Signed)
PACU: Lab here to draw blood for Blood Panel Exposure

## 2015-08-25 NOTE — Discharge Instructions (Addendum)
Bilateral Salpingo-Oophorectomy, Care After Refer to this sheet in the next few weeks. These instructions provide you with information on caring for yourself after your procedure. Your health care provider may also give you more specific instructions. Your treatment has been planned according to current medical practices, but problems sometimes occur. Call your health care provider if you have any problems or questions after your procedure. WHAT TO EXPECT AFTER THE PROCEDURE After your procedure, it is typical to have the following:  Abdominal pain that can be controlled with pain medicine.  Vaginal spotting.  Constipation. HOME CARE INSTRUCTIONS   Get plenty of rest and sleep.  Only take over-the-counter or prescription medicines as directed by your health care provider. Do not take aspirin. It can cause bleeding.  Keep incision areas clean and dry. Remove or change any bandages (dressings) only as directed by your health care provider.  Follow your health care provider's advice regarding diet.  Drink enough fluids to keep your urine clear or pale yellow.  Limit exercise and activities as directed by your health care provider. Do not lift anything heavier than 5 pounds (2.3 kg) until your health care provider approves.  Do not drive until your health care provider approves.  Do not drink alcohol until your health care provider approves.  Do not have sexual intercourse until your health care provider says it is OK.  Take your temperature twice a day and write it down.  If you become constipated, you may:  Ask your health care provider about taking a mild laxative.  Add more fruit and bran to your diet.  Drink more fluids.  Follow up with your health care provider as directed. SEEK MEDICAL CARE IF:   You have swelling or redness in the incision area.  You develop a rash.  You feel lightheaded.  You have pain that is not controlled with medicine.  You have pain,  swelling, or redness where the IV access tube was placed. SEEK IMMEDIATE MEDICAL CARE IF:  You have a fever.  You develop increasing abdominal pain.  You see pus coming out of the incision, or the incision is separating.  You notice a bad smell coming from the wound or dressing.  You have excessive vaginal bleeding.  You feel sick to your stomach (nauseous) and vomit.  You have leg or chest pain.  You have pain when you urinate.  You develop shortness of breath.  You pass out.   This information is not intended to replace advice given to you by your health care provider. Make sure you discuss any questions you have with your health care provider.   Document Released: 11/04/2008 Document Revised: 10/29/2012 Document Reviewed: 07/02/2012 Elsevier Interactive Patient Education 2016 [place] Anesthesia, Adult, Care After Refer to this sheet in the next few weeks. These instructions provide you with information on caring for yourself after your procedure. Your health care provider may also give you more specific instructions. Your treatment has been planned according to current medical practices, but problems sometimes occur. Call your health care provider if you have any problems or questions after your procedure. WHAT TO EXPECT AFTER THE PROCEDURE After the procedure, it is typical to experience:  Sleepiness.  Nausea and vomiting. HOME CARE INSTRUCTIONS  For the first 24 hours after general anesthesia:  Have a responsible person with you.  Do not drive a car. If you are alone, do not take public transportation.  Do not drink alcohol.  Do not take medicine that has not been prescribed by your health care provider.  Do not sign important papers or make important decisions.  You may resume a normal diet and activities as directed by your health care provider.  If you have questions or problems that seem related to general anesthesia, call the hospital  and ask for the anesthetist or anesthesiologist on call. SEEK MEDICAL CARE IF:  You have nausea and vomiting that continue the day after anesthesia.  You develop a rash. SEEK IMMEDIATE MEDICAL CARE IF:   You have difficulty breathing.  You have chest pain.  You have any allergic problems.   This information is not intended to replace advice given to you by your health care provider. Make sure you discuss any questions you have with your health care provider.   Document Released: 04/16/2000 Document Revised: 01/29/2014 Document Reviewed: 05/09/2011 Elsevier Interactive Patient Education [employer].

## 2015-08-29 ENCOUNTER — Telehealth: Payer: Self-pay

## 2015-08-29 NOTE — Telephone Encounter (Signed)
Orders received from [place] to contact the patient to update with final surgical pathology report : "no cancer noted"and to follow up on her postoperatively. Patient contacted and updated with pathology report results , patient states understanding. Post-op follow up appointment with Dr Everitt Amber was scheduled for Sept 08, 2017.Patient states she doing well post-operatively ,but has a slight rash under breast that is itching intermittently. Patient states she applying an OTC hydrocortisone cream and seems to be helping. Patient denies further complaints at this time . Patient will call with any additional changes , questions or concerns.

## 2015-09-22 ENCOUNTER — Ambulatory Visit: Payer: Medicare Other | Attending: Gynecologic Oncology | Admitting: Gynecologic Oncology

## 2015-09-22 ENCOUNTER — Encounter: Payer: Self-pay | Admitting: Gynecologic Oncology

## 2015-09-22 VITALS — BP 143/78 | HR 94 | Temp 98.5°F | Resp 18 | Wt 209.1 lb

## 2015-09-22 DIAGNOSIS — Z8261 Family history of arthritis: Secondary | ICD-10-CM | POA: Insufficient documentation

## 2015-09-22 DIAGNOSIS — Z90710 Acquired absence of both cervix and uterus: Secondary | ICD-10-CM | POA: Diagnosis not present

## 2015-09-22 DIAGNOSIS — Z981 Arthrodesis status: Secondary | ICD-10-CM | POA: Diagnosis not present

## 2015-09-22 DIAGNOSIS — Z881 Allergy status to other antibiotic agents status: Secondary | ICD-10-CM | POA: Diagnosis not present

## 2015-09-22 DIAGNOSIS — K449 Diaphragmatic hernia without obstruction or gangrene: Secondary | ICD-10-CM | POA: Diagnosis not present

## 2015-09-22 DIAGNOSIS — Z800 Family history of malignant neoplasm of digestive organs: Secondary | ICD-10-CM | POA: Diagnosis not present

## 2015-09-22 DIAGNOSIS — Z90722 Acquired absence of ovaries, bilateral: Secondary | ICD-10-CM | POA: Diagnosis not present

## 2015-09-22 DIAGNOSIS — Z880 Allergy status to penicillin: Secondary | ICD-10-CM | POA: Insufficient documentation

## 2015-09-22 DIAGNOSIS — Z886 Allergy status to analgesic agent status: Secondary | ICD-10-CM | POA: Insufficient documentation

## 2015-09-22 DIAGNOSIS — Z885 Allergy status to narcotic agent status: Secondary | ICD-10-CM | POA: Insufficient documentation

## 2015-09-22 DIAGNOSIS — E119 Type 2 diabetes mellitus without complications: Secondary | ICD-10-CM | POA: Insufficient documentation

## 2015-09-22 DIAGNOSIS — G4733 Obstructive sleep apnea (adult) (pediatric): Secondary | ICD-10-CM | POA: Insufficient documentation

## 2015-09-22 DIAGNOSIS — N83201 Unspecified ovarian cyst, right side: Secondary | ICD-10-CM | POA: Insufficient documentation

## 2015-09-22 DIAGNOSIS — N83209 Unspecified ovarian cyst, unspecified side: Secondary | ICD-10-CM

## 2015-09-22 DIAGNOSIS — Z8701 Personal history of pneumonia (recurrent): Secondary | ICD-10-CM | POA: Diagnosis not present

## 2015-09-22 DIAGNOSIS — F418 Other specified anxiety disorders: Secondary | ICD-10-CM | POA: Insufficient documentation

## 2015-09-22 DIAGNOSIS — E785 Hyperlipidemia, unspecified: Secondary | ICD-10-CM | POA: Insufficient documentation

## 2015-09-22 DIAGNOSIS — M1990 Unspecified osteoarthritis, unspecified site: Secondary | ICD-10-CM | POA: Insufficient documentation

## 2015-09-22 DIAGNOSIS — K219 Gastro-esophageal reflux disease without esophagitis: Secondary | ICD-10-CM | POA: Insufficient documentation

## 2015-09-22 DIAGNOSIS — E0590 Thyrotoxicosis, unspecified without thyrotoxic crisis or storm: Secondary | ICD-10-CM | POA: Insufficient documentation

## 2015-09-22 DIAGNOSIS — I10 Essential (primary) hypertension: Secondary | ICD-10-CM | POA: Diagnosis not present

## 2015-09-22 DIAGNOSIS — Z8249 Family history of ischemic heart disease and other diseases of the circulatory system: Secondary | ICD-10-CM | POA: Diagnosis not present

## 2015-09-22 DIAGNOSIS — Z8711 Personal history of peptic ulcer disease: Secondary | ICD-10-CM | POA: Diagnosis not present

## 2015-09-22 DIAGNOSIS — Z8041 Family history of malignant neoplasm of ovary: Secondary | ICD-10-CM | POA: Insufficient documentation

## 2015-09-22 DIAGNOSIS — R350 Frequency of micturition: Secondary | ICD-10-CM | POA: Diagnosis not present

## 2015-09-22 DIAGNOSIS — Z833 Family history of diabetes mellitus: Secondary | ICD-10-CM | POA: Insufficient documentation

## 2015-09-22 NOTE — Progress Notes (Signed)
Consult Note: Gyn-Onc   Westly Pam 74 y.o. female  Chief Complaint  Patient presents with  . Right Ovarian Cyst    Post-op follow up    Assessment :Complex right ovarian cyst which has rapidly increased in size in the last 2 months.  S/p BSO with benign pathology..  Plan: Follow-up with  Dr. [name]  HPI: 66 year old African-American female gravida 6 seen in consultation at the request of Dr. [name] regarding management of a right ovarian cyst which is increasing in size. Because of a family history of ovarian cancer Dr. cousins obtained an ultrasound on 06/14/2015 revealing a 7 mm complex right ovarian cyst. The ultrasound was repeated on 08/17/2015 and now reveals a 3.5 x 2.3 x 1.8 cm complex cyst showing slight vascularity and calcifications. Left ovary appears normal. Patient has had CA-125 value consider 10 units per mL.  The patient's mother at age 34 developed ovarian cancer. The patient also reports that she had a maternal aunt with ovarian and pancreatic cancer. There is no apparent breast cancer in the family.  S/p BSO 08/25/2015 with benign pathology.. 1. Ovary and fallopian tube, right - BENIGN OVARY WITH ASSOCIATED BENIGN CALCIFIED NODULE. - BENIGN FALLOPIAN TUBE WITH EMBEDDED CALCIFICATIONS. - NO ATYPIA OR MALIGNANCY IDENTIFIED. - SEE COMMENT. 2. Ovary and fallopian tube, left - BENIGN OVARY. - BENIGN FALLOPIAN TUBE. - NO ATYPIA OR MALIGNANCY IDENTIFIED. Microscopic Comment 1. The calcified nodule shows a large embedded coarse calcification surrounded by a fibrotic capsule. There is no atypia or malignancy identified.   Review of Systems: No fever or chills no nausea vomiting no abdominal discomfot no flank pain no sensory or motor changes in a upper and lower extremities.   She reports a Miliary rash that developed  Immediately after surgery,   Vitals: Blood pressure (!) 143/78, pulse 94, temperature 98.5 F (36.9 C), temperature source Oral, resp. rate 18,  weight 209 lb 1.6 oz (94.8 kg), SpO2 100 %. Physical Exam: General : The patient is a healthy woman in no acute distress.  HEENT: normocephalic, extraoccular movements normal; neck is supple without thyromegally  Lynphnodes: Supraclavicular and inguinal nodes not enlarged  Abdomen: Soft, non-tender, no ascites, no organomegally, no masses, no hernias, Hyperpigmentation at the sites of placement of adhesive  Back:  No CVATLower extremities: No edema or varicosities. Normal range of motion      Allergies  Allergen Reactions  . Flagyl [Metronidazole] Other (See Comments)    Caused increased heart rate  . Aspirin Effervescent Swelling    Alka-Seltzer gel caps- sore mouth, face swollen, turned hands white  . Morphine Sulfate Hives    Hallucinations  . Penicillins Swelling and Rash    Has patient had a PCN reaction causing immediate rash, facial/tongue/throat swelling, SOB or lightheadedness with hypotension: Yes Has patient had a PCN reaction causing severe rash involving mucus membranes or skin necrosis: No Has patient had a PCN reaction that required hospitalization No Has patient had a PCN reaction occurring within the last 10 years: No If all of the above answers are "NO", then may proceed with Cephalosporin use.     Past Medical History:  Diagnosis Date  . Allergy   . Anxiety   . Arthritis   . Asthma    patient denies   . Depression   . Diabetes mellitus    Type two  . Diverticulitis   . Family history of adverse reaction to anesthesia    daughter- N/V   . Frequency of urination   .  GERD (gastroesophageal reflux disease)   . History of hiatal hernia   . Hyperglycemia   . Hyperlipidemia   . Hypertension   . Hyperthyroidism    PT HAD BIPOSY -NEGATIVE.Marland Kitchen AND NOW JUST GETS CHECKED EACH YEAR .Marland Kitchen 2013 LAST TEST   SEES DR. [name].   . Multiple meningiomas of spine and brain ([place])   . Obstructive sleep apnea    [facility]Y AT [facility] DOES NOT USE C PAP  . Palpitations   . Peptic  ulcer disease   . Pneumonia    "walking pneumonia" hx of  . Seizures ([place])    one siezure with brain surgery none since 2015     Past Surgical History:  Procedure Laterality Date  . ABDOMINAL HYSTERECTOMY    . BACK SURGERY     L SPIN 2003   NECK 2004  . BRAIN MENINGIOMA EXCISION  8/10   X 2  . C5-C6 neck fusion    . COLONOSCOPY    . CRANIOTOMY  08/13/2011   Procedure: CRANIOTOMY TUMOR EXCISION;  Surgeon: [name], MD;  Location: [place] [facility];  Service: Neurosurgery;  Laterality: Bilateral;  Bifrontal craniotomy for tumor  . DILATION AND CURETTAGE OF UTERUS     YEARS AGO  . L4-L5 posterior la    . left foot plantar and hammertoe    . right foot bunectomy    . right knee arthroscopy     x 3  . right shoulder rotator cuff    . ROBOTIC ASSISTED BILATERAL SALPINGO OOPHERECTOMY N/A 08/25/2015   Procedure: XI ROBOTIC ASSISTED BILATERAL SALPINGO OOPHORECTOMY;  Surgeon: [name], MD;  Location: [facility];  Service: Gynecology;  Laterality: N/A;  . TONSILLECTOMY    . TUBAL LIGATION    . WRIST SURGERY Right      Social History   Social History  . Marital status: Married    Spouse name: N/A  . Number of children: N/A  . Years of education: N/A   Occupational History  . Not on file.   Social History Main Topics  . Smoking status: Never Smoker  . Smokeless tobacco: Never Used  . Alcohol use No  . Drug use: No  . Sexual activity: No   Other Topics Concern  . Not on file   Social History Narrative  . No narrative on file    Family History  Problem Relation Age of Onset  . Cancer Mother     ovarian  . Heart disease Father 37  . Arthritis Other   . Hyperlipidemia Other   . Hypertension Other   . Diabetes Other   . Anesthesia problems Daughter     NAUSEA AND VOMITING POST OP

## 2015-09-22 NOTE — Patient Instructions (Signed)
Plan to follow up with Dr. [name] for your well woman care and Dr. Elease Hashimoto.  Please call for any questions or concerns.

## 2015-09-23 ENCOUNTER — Ambulatory Visit: Payer: Medicare Other | Admitting: Gynecologic Oncology

## 2015-09-30 ENCOUNTER — Ambulatory Visit: Payer: Medicare Other | Admitting: Gynecologic Oncology

## 2015-10-03 ENCOUNTER — Ambulatory Visit (INDEPENDENT_AMBULATORY_CARE_PROVIDER_SITE_OTHER): Payer: Medicare Other | Admitting: Family Medicine

## 2015-10-03 VITALS — BP 114/82 | HR 93 | Temp 98.1°F | Ht 64.0 in | Wt 209.5 lb

## 2015-10-03 DIAGNOSIS — I10 Essential (primary) hypertension: Secondary | ICD-10-CM

## 2015-10-03 DIAGNOSIS — Z23 Encounter for immunization: Secondary | ICD-10-CM

## 2015-10-03 DIAGNOSIS — N181 Chronic kidney disease, stage 1: Secondary | ICD-10-CM

## 2015-10-03 DIAGNOSIS — E0590 Thyrotoxicosis, unspecified without thyrotoxic crisis or storm: Secondary | ICD-10-CM | POA: Diagnosis not present

## 2015-10-03 DIAGNOSIS — E1122 Type 2 diabetes mellitus with diabetic chronic kidney disease: Secondary | ICD-10-CM | POA: Diagnosis not present

## 2015-10-03 DIAGNOSIS — E785 Hyperlipidemia, unspecified: Secondary | ICD-10-CM

## 2015-10-03 DIAGNOSIS — R5383 Other fatigue: Secondary | ICD-10-CM

## 2015-10-03 NOTE — Progress Notes (Signed)
Pre visit review using our clinic review tool, if applicable. No additional management support is needed unless otherwise documented below in the visit note. 

## 2015-10-03 NOTE — Patient Instructions (Signed)
Consider reducing Metoprolol to one half tablet daily to see if fatigue is reduced.

## 2015-10-03 NOTE — Progress Notes (Signed)
Subjective:     Patient ID: Dawn Salas, female   DOB: [birth year], 75 y.o.   MRN: [id number]  HPI Patient seen for medical follow-up. Her chronic problems include history of obesity, type 2 diabetes, dyslipidemia, hypertension, hyperthyroidism, obstructive sleep apnea, GERD, meningioma of the brain. She had recent bilateral ovariectomy secondary to complex cystic mass. Fortunately, this did not prove to be malignant. She is followed by endocrinology for hyperthyroidism and maintained on low-dose methimazole. Recent TSH was still low and no adjustment in medication was made. She's had some recent mild weight gain.  Diabetes stable. Recent A1c 6.3%. Still exercises with swimming. Has complained of some fatigue issues. She thinks is maybe related to beta blocker use. Denies depression. No chest pains.  GERD which has been stable symptomatically  Past Medical History:  Diagnosis Date  . Allergy   . Anxiety   . Arthritis   . Asthma    patient denies   . Depression   . Diabetes mellitus    Type two  . Diverticulitis   . Family history of adverse reaction to anesthesia    daughter- N/V   . Frequency of urination   . GERD (gastroesophageal reflux disease)   . History of hiatal hernia   . Hyperglycemia   . Hyperlipidemia   . Hypertension   . Hyperthyroidism    PT HAD BIPOSY -NEGATIVE.Marland Kitchen AND NOW JUST GETS CHECKED EACH YEAR .Marland Kitchen 2013 LAST TEST   SEES DR. [name].   . Multiple meningiomas of spine and brain ([place])   . Obstructive sleep apnea    STUDY AT [facility] DOES NOT USE C PAP  . Palpitations   . Peptic ulcer disease   . Pneumonia    "walking pneumonia" hx of  . Seizures ([place])    one siezure with brain surgery none since 2015    Past Surgical History:  Procedure Laterality Date  . ABDOMINAL HYSTERECTOMY    . BACK SURGERY     L SPIN 2003   NECK 2004  . BRAIN MENINGIOMA EXCISION  8/10   X 2  . C5-C6 neck fusion    . COLONOSCOPY    . CRANIOTOMY  08/13/2011   Procedure: CRANIOTOMY  TUMOR EXCISION;  Surgeon: [name], MD;  Location: [place] [facility];  Service: Neurosurgery;  Laterality: Bilateral;  Bifrontal craniotomy for tumor  . DILATION AND CURETTAGE OF UTERUS     YEARS AGO  . L4-L5 posterior la    . left foot plantar and hammertoe    . right foot bunectomy    . right knee arthroscopy     x 3  . right shoulder rotator cuff    . ROBOTIC ASSISTED BILATERAL SALPINGO OOPHERECTOMY N/A 08/25/2015   Procedure: XI ROBOTIC ASSISTED BILATERAL SALPINGO OOPHORECTOMY;  Surgeon: [name], MD;  Location: [facility];  Service: Gynecology;  Laterality: N/A;  . TONSILLECTOMY    . TUBAL LIGATION    . WRIST SURGERY Right     reports that she has never smoked. She has never used smokeless tobacco. She reports that she does not drink alcohol or use drugs. family history includes Anesthesia problems in her daughter; Arthritis in her other; Cancer in her mother; Diabetes in her other; Heart disease (age of onset: 27) in her father; Hyperlipidemia in her other; Hypertension in her other. Allergies  Allergen Reactions  . Flagyl [Metronidazole] Other (See Comments)    Caused increased heart rate  . Aspirin Effervescent Swelling    Alka-Seltzer gel  caps- sore mouth, face swollen, turned hands white  . Morphine Sulfate Hives    Hallucinations  . Penicillins Swelling and Rash    Has patient had a PCN reaction causing immediate rash, facial/tongue/throat swelling, SOB or lightheadedness with hypotension: Yes Has patient had a PCN reaction causing severe rash involving mucus membranes or skin necrosis: No Has patient had a PCN reaction that required hospitalization No Has patient had a PCN reaction occurring within the last 10 years: No If all of the above answers are "NO", then may proceed with Cephalosporin use.      Review of Systems  Constitutional: Positive for fatigue. Negative for appetite change and unexpected weight change.  HENT: Negative for trouble swallowing.   Eyes:  Negative for visual disturbance.  Respiratory: Negative for cough, chest tightness, shortness of breath and wheezing.   Cardiovascular: Negative for chest pain, palpitations and leg swelling.  Gastrointestinal: Negative for abdominal pain.  Genitourinary: Negative for dysuria.  Neurological: Negative for dizziness, seizures, syncope, weakness, light-headedness and headaches.       Objective:   Physical Exam  Constitutional: She appears well-developed and well-nourished.  Neck: Neck supple.  Cardiovascular: Normal rate and regular rhythm.   Pulmonary/Chest: Effort normal and breath sounds normal. No respiratory distress. She has no wheezes. She has no rales.  Abdominal: Soft. She exhibits no distension and no mass. There is no tenderness. There is no rebound and no guarding.  Musculoskeletal: She exhibits no edema.  Lymphadenopathy:    She has no cervical adenopathy.       Assessment:     #1 type 2 diabetes. History of good control  #2 hypertension stable and at goal  #3 hyperthyroidism  #4 dyslipidemia  #5 fatigue-possibly related to recent decrease in exercise from surgery and also beta blocker use    Plan:     -Try reducing her metoprolol to 50 mg daily. She will take one half of 100 mg tablet. -Flu vaccine given -Routine follow-up 6 months and recheck A1c then  Eulas Post MD [facility] at [facility]

## 2015-11-02 ENCOUNTER — Ambulatory Visit (INDEPENDENT_AMBULATORY_CARE_PROVIDER_SITE_OTHER): Payer: Self-pay | Admitting: Orthopedic Surgery

## 2015-11-12 ENCOUNTER — Other Ambulatory Visit: Payer: Self-pay | Admitting: Family Medicine

## 2015-11-25 ENCOUNTER — Encounter: Payer: Self-pay | Admitting: Family Medicine

## 2015-11-25 ENCOUNTER — Ambulatory Visit (INDEPENDENT_AMBULATORY_CARE_PROVIDER_SITE_OTHER): Payer: Medicare Other | Admitting: Family Medicine

## 2015-11-25 VITALS — BP 120/80 | HR 78 | Temp 98.2°F | Ht 64.0 in | Wt 208.6 lb

## 2015-11-25 DIAGNOSIS — E1122 Type 2 diabetes mellitus with diabetic chronic kidney disease: Secondary | ICD-10-CM | POA: Diagnosis not present

## 2015-11-25 DIAGNOSIS — R21 Rash and other nonspecific skin eruption: Secondary | ICD-10-CM

## 2015-11-25 DIAGNOSIS — N181 Chronic kidney disease, stage 1: Secondary | ICD-10-CM | POA: Diagnosis not present

## 2015-11-25 DIAGNOSIS — Z862 Personal history of diseases of the blood and blood-forming organs and certain disorders involving the immune mechanism: Secondary | ICD-10-CM | POA: Insufficient documentation

## 2015-11-25 DIAGNOSIS — I10 Essential (primary) hypertension: Secondary | ICD-10-CM | POA: Diagnosis not present

## 2015-11-25 DIAGNOSIS — R5383 Other fatigue: Secondary | ICD-10-CM

## 2015-11-25 LAB — CBC WITH DIFFERENTIAL/PLATELET
Basophils Absolute: 0 10*3/uL (ref 0.0–0.1)
Basophils Relative: 0.8 % (ref 0.0–3.0)
Eosinophils Absolute: 0.2 10*3/uL (ref 0.0–0.7)
Eosinophils Relative: 4 % (ref 0.0–5.0)
HCT: 36.1 % (ref 36.0–46.0)
Hemoglobin: 11.9 g/dL — ABNORMAL LOW (ref 12.0–15.0)
Lymphocytes Relative: 37.6 % (ref 12.0–46.0)
Lymphs Abs: 1.5 10*3/uL (ref 0.7–4.0)
MCHC: 32.8 g/dL (ref 30.0–36.0)
MCV: 76.3 fl — ABNORMAL LOW (ref 78.0–100.0)
Monocytes Absolute: 0.3 10*3/uL (ref 0.1–1.0)
Monocytes Relative: 7.8 % (ref 3.0–12.0)
Neutro Abs: 2 10*3/uL (ref 1.4–7.7)
Neutrophils Relative %: 49.8 % (ref 43.0–77.0)
Platelets: 219 10*3/uL (ref 150.0–400.0)
RBC: 4.73 Mil/uL (ref 3.87–5.11)
RDW: 14.7 % (ref 11.5–15.5)
WBC: 4 10*3/uL (ref 4.0–10.5)

## 2015-11-25 LAB — BASIC METABOLIC PANEL
BUN: 15 mg/dL (ref 6–23)
CO2: 28 mEq/L (ref 19–32)
Calcium: 9.5 mg/dL (ref 8.4–10.5)
Chloride: 103 mEq/L (ref 96–112)
Creatinine, Ser: 1.17 mg/dL (ref 0.40–1.20)
GFR: 58.31 mL/min — ABNORMAL LOW (ref >60.00)
Glucose, Bld: 76 mg/dL (ref 70–99)
Potassium: 4 mEq/L (ref 3.5–5.1)
Sodium: 140 mEq/L (ref 135–145)

## 2015-11-25 LAB — POCT GLYCOSYLATED HEMOGLOBIN (HGB A1C): Hemoglobin A1C: 6.3

## 2015-11-25 MED ORDER — TRIAMCINOLONE ACETONIDE 0.1 % EX CREA: 1.0000 "application " | TOPICAL_CREAM | Freq: Two times a day (BID) | CUTANEOUS | 1 refills | Status: DC | PRN

## 2015-11-25 NOTE — Progress Notes (Signed)
Pre visit review using our clinic review tool, if applicable. No additional management support is needed unless otherwise documented below in the visit note. 

## 2015-11-25 NOTE — Progress Notes (Signed)
Subjective:     Patient ID: Dawn Salas, female   DOB: 09/06/1940, 75 y.o.   MRN: [id number]  HPI Patient seen for several issues as follows  Nonspecific fatigue.  She has multiple chronic problems including history of obesity, hypertension, obstructive sleep apnea, GERD, hyperthyroidism, type 2 diabetes, history of meningiomas, history of chronic normocytic anemia, history of partial seizure disorder, hyperlipidemia. She's been followed by endocrinologist over in [facility]. She had recent echocardiogram and she apparently came away with the impression that the technician had stated that she might be anemic somehow based on results of echo. We do not have that report. Last hemoglobin 11.1 last August. She's not had any obvious blood loss recently. She is requesting having her hemoglobin repeated  Type 2 diabetes which has been well controlled with metformin. Last A1c 6.3%. Not monitoring blood sugar regularly  Hyperthyroidism. She is considering getting her care transferred here. She is maintained on Tapazole.  Followed by endocrinologist in [place] problem of skin rash.  Location is lower legs. Slightly raised and pruritic. No clear precipitating factors. Exacerbated by heat. No alleviating factors. She tried some over-the-counter hydrocortisone cream without much relief.  Past Medical History:  Diagnosis Date  . Allergy   . Anxiety   . Arthritis   . Asthma    patient denies   . Depression   . Diabetes mellitus    Type two  . Diverticulitis   . Family history of adverse reaction to anesthesia    daughter- N/V   . Frequency of urination   . GERD (gastroesophageal reflux disease)   . History of hiatal hernia   . Hyperglycemia   . Hyperlipidemia   . Hypertension   . Hyperthyroidism    PT HAD BIPOSY -NEGATIVE.Marland Kitchen AND NOW JUST GETS CHECKED EACH YEAR .Marland Kitchen 2013 LAST TEST   SEES DR. [name].   . Multiple meningiomas of spine and brain ([place])   . Obstructive sleep apnea    STUDY AT  [facility] DOES NOT USE C PAP  . Palpitations   . Peptic ulcer disease   . Pneumonia    "walking pneumonia" hx of  . Seizures ([place])    one siezure with brain surgery none since 2015    Past Surgical History:  Procedure Laterality Date  . ABDOMINAL HYSTERECTOMY    . BACK SURGERY     L SPIN 2003   NECK 2004  . BRAIN MENINGIOMA EXCISION  8/10   X 2  . C5-C6 neck fusion    . COLONOSCOPY    . CRANIOTOMY  08/13/2011   Procedure: CRANIOTOMY TUMOR EXCISION;  Surgeon: [name], MD;  Location: [facility];  Service: Neurosurgery;  Laterality: Bilateral;  Bifrontal craniotomy for tumor  . DILATION AND CURETTAGE OF UTERUS     YEARS AGO  . L4-L5 posterior la    . left foot plantar and hammertoe    . right foot bunectomy    . right knee arthroscopy     x 3  . right shoulder rotator cuff    . ROBOTIC ASSISTED BILATERAL SALPINGO OOPHERECTOMY N/A 08/25/2015   Procedure: XI ROBOTIC ASSISTED BILATERAL SALPINGO OOPHORECTOMY;  Surgeon: [name], MD;  Location: [facility];  Service: Gynecology;  Laterality: N/A;  . TONSILLECTOMY    . TUBAL LIGATION    . WRIST SURGERY Right     reports that she has never smoked. She has never used smokeless tobacco. She reports that she does not drink  alcohol or use drugs. family history includes Anesthesia problems in her daughter; Arthritis in her other; Cancer in her mother; Diabetes in her other; Heart disease (age of onset: 72) in her father; Hyperlipidemia in her other; Hypertension in her other. Allergies  Allergen Reactions  . Flagyl [Metronidazole] Other (See Comments)    Caused increased heart rate  . Aspirin Effervescent Swelling    Alka-Seltzer gel caps- sore mouth, face swollen, turned hands white  . Morphine Sulfate Hives    Hallucinations  . Penicillins Swelling and Rash    Has patient had a PCN reaction causing immediate rash, facial/tongue/throat swelling, SOB or lightheadedness with hypotension: Yes Has patient had a PCN reaction causing  severe rash involving mucus membranes or skin necrosis: No Has patient had a PCN reaction that required hospitalization No Has patient had a PCN reaction occurring within the last 10 years: No If all of the above answers are "NO", then may proceed with Cephalosporin use.      Review of Systems  Constitutional: Positive for fatigue.  Eyes: Negative for visual disturbance.  Respiratory: Negative for cough, chest tightness, shortness of breath and wheezing.   Cardiovascular: Negative for chest pain, palpitations and leg swelling.  Gastrointestinal: Negative for abdominal pain.  Genitourinary: Negative for dysuria.  Skin: Positive for rash.  Neurological: Negative for dizziness, seizures, syncope, weakness, light-headedness and headaches.       Objective:   Physical Exam  Constitutional: She appears well-developed and well-nourished.  Eyes: Pupils are equal, round, and reactive to light.  Neck: Neck supple. No JVD present. No thyromegaly present.  Cardiovascular: Normal rate and regular rhythm.  Exam reveals no gallop.   Pulmonary/Chest: Effort normal and breath sounds normal. No respiratory distress. She has no wheezes. She has no rales.  Musculoskeletal: She exhibits no edema.  Neurological: She is alert.  Skin: Rash noted.  She has slightly raised erythematous slightly vesicular rash right lower lateral leg and the lesser extent left lower leg       Assessment:     #1 fatigue. Likely multifactorial.  #2 history of normocytic anemia  #3 history of hyperthyroidism followed by endocrinology  #4 history of type 2 diabetes which has been well controlled  #5 skin rash lower legs. Appears to be more likely contact dermatitis    Plan:     -Check labs with CBC, basic metabolic panel, hemoglobin A1c -Triamcinolone 0.1% cream twice daily as needed to leg rash and avoid scratching -Obtain records from her endocrinologist and she is considering transferring care here to [facility]  (for her hyperthyroidism).    Eulas Post MD [facility]

## 2015-11-26 ENCOUNTER — Other Ambulatory Visit: Payer: Self-pay | Admitting: Family Medicine

## 2015-12-09 ENCOUNTER — Other Ambulatory Visit: Payer: Self-pay | Admitting: Family Medicine

## 2016-01-11 ENCOUNTER — Other Ambulatory Visit: Payer: Self-pay | Admitting: Family Medicine

## 2016-02-25 ENCOUNTER — Other Ambulatory Visit: Payer: Self-pay | Admitting: Family Medicine

## 2016-03-05 ENCOUNTER — Other Ambulatory Visit: Payer: Self-pay | Admitting: Family Medicine

## 2016-03-05 DIAGNOSIS — Z1231 Encounter for screening mammogram for malignant neoplasm of breast: Secondary | ICD-10-CM

## 2016-03-12 ENCOUNTER — Ambulatory Visit (INDEPENDENT_AMBULATORY_CARE_PROVIDER_SITE_OTHER): Payer: Medicare Other | Admitting: Family Medicine

## 2016-03-12 VITALS — BP 122/80 | HR 118 | Temp 98.2°F | Ht 64.0 in | Wt 209.0 lb

## 2016-03-12 DIAGNOSIS — E0590 Thyrotoxicosis, unspecified without thyrotoxic crisis or storm: Secondary | ICD-10-CM

## 2016-03-12 DIAGNOSIS — R21 Rash and other nonspecific skin eruption: Secondary | ICD-10-CM

## 2016-03-12 LAB — T3, FREE: T3, Free: 5 pg/mL — ABNORMAL HIGH (ref 2.3–4.2)

## 2016-03-12 LAB — TSH: TSH: 0.02 u[IU]/mL — ABNORMAL LOW (ref 0.35–4.50)

## 2016-03-12 LAB — T4, FREE: Free T4: 1.41 ng/dL (ref 0.60–1.60)

## 2016-03-12 MED ORDER — TRIAMCINOLONE ACETONIDE 0.1 % EX CREA: TOPICAL_CREAM | Freq: Two times a day (BID) | CUTANEOUS | 5 refills | Status: DC | PRN

## 2016-03-12 NOTE — Patient Instructions (Signed)
Try to avoid scratching rash as much as possible. We will call you regarding labs today.

## 2016-03-12 NOTE — Progress Notes (Signed)
Subjective:     Patient ID: Dawn Salas, female   DOB: [birth year], 77 y.o.   MRN: [id number]  HPI Patient seen for the following issues  Pruritic rash involving multiple areas of her body including upper back, lower legs bilaterally, and left lateral elbow. She's had somewhat similar rash the past. Slightly raised. Intense pruritus at times. Has used triamcinolone 0.1% in the past with some success.  Symptoms are worse with heat. No other clear exacerbating factors  History of hyperthyroidism. She has been followed by endocrinologist in [facility] and is requesting change here back to [place]. She takes Tapazole which was increased back in December. Her lab work then showed TSH 0.02 with free T4 of 1.0 and free T3 of 3..54. Her Tapazole was increased from 5 mg to 7.5 mg daily. Compliant with therapy. She states she has symptoms now reminiscent of when she had hyperthyroidism diagnosed.  Her main symptoms are fatigue and weakness. No diarrhea. Occasional palpitations. No significant weight loss.  Past Medical History:  Diagnosis Date  . Allergy   . Anxiety   . Arthritis   . Asthma    patient denies   . Depression   . Diabetes mellitus    Type two  . Diverticulitis   . Family history of adverse reaction to anesthesia    daughter- N/V   . Frequency of urination   . GERD (gastroesophageal reflux disease)   . History of hiatal hernia   . Hyperglycemia   . Hyperlipidemia   . Hypertension   . Hyperthyroidism    PT HAD BIPOSY -NEGATIVE.Marland Kitchen AND NOW JUST GETS CHECKED EACH YEAR .Marland Kitchen 2013 LAST TEST   SEES DR. [name].   . Multiple meningiomas of spine and brain ([place])   . Obstructive sleep apnea    STUDY AT [facility] DOES NOT USE C PAP  . Palpitations   . Peptic ulcer disease   . Pneumonia    "walking pneumonia" hx of  . Seizures ([place])    one siezure with brain surgery none since 2015    Past Surgical History:  Procedure Laterality Date  . ABDOMINAL HYSTERECTOMY    . BACK SURGERY     L  SPIN 2003   NECK 2004  . BRAIN MENINGIOMA EXCISION  8/10   X 2  . C5-C6 neck fusion    . COLONOSCOPY    . CRANIOTOMY  08/13/2011   Procedure: CRANIOTOMY TUMOR EXCISION;  Surgeon: [name], MD;  Location: [facility];  Service: Neurosurgery;  Laterality: Bilateral;  Bifrontal craniotomy for tumor  . DILATION AND CURETTAGE OF UTERUS     YEARS AGO  . L4-L5 posterior la    . left foot plantar and hammertoe    . right foot bunectomy    . right knee arthroscopy     x 3  . right shoulder rotator cuff    . ROBOTIC ASSISTED BILATERAL SALPINGO OOPHERECTOMY N/A 08/25/2015   Procedure: XI ROBOTIC ASSISTED BILATERAL SALPINGO OOPHORECTOMY;  Surgeon: [name], MD;  Location: [facility];  Service: Gynecology;  Laterality: N/A;  . TONSILLECTOMY    . TUBAL LIGATION    . WRIST SURGERY Right     reports that she has never smoked. She has never used smokeless tobacco. She reports that she does not drink alcohol or use drugs. family history includes Anesthesia problems in her daughter; Arthritis in her other; Cancer in her mother; Diabetes in her other; Heart disease (age of onset: 59) in her  father; Hyperlipidemia in her other; Hypertension in her other. Allergies  Allergen Reactions  . Flagyl [Metronidazole] Other (See Comments)    Caused increased heart rate  . Aspirin Effervescent Swelling    Alka-Seltzer gel caps- sore mouth, face swollen, turned hands white  . Morphine Sulfate Hives    Hallucinations  . Penicillins Swelling and Rash    Has patient had a PCN reaction causing immediate rash, facial/tongue/throat swelling, SOB or lightheadedness with hypotension: Yes Has patient had a PCN reaction causing severe rash involving mucus membranes or skin necrosis: No Has patient had a PCN reaction that required hospitalization No Has patient had a PCN reaction occurring within the last 10 years: No If all of the above answers are "NO", then may proceed with Cephalosporin use.      Review  of Systems  Constitutional: Positive for fatigue.  Eyes: Negative for visual disturbance.  Respiratory: Negative for cough, chest tightness, shortness of breath and wheezing.   Cardiovascular: Negative for chest pain, palpitations and leg swelling.  Skin: Positive for rash.  Neurological: Positive for weakness. Negative for dizziness, seizures, syncope, light-headedness and headaches.       Objective:   Physical Exam  Constitutional: She appears well-developed and well-nourished. No distress.  Cardiovascular: Normal rate and regular rhythm.   Pulmonary/Chest: Effort normal and breath sounds normal.  Musculoskeletal: She exhibits no edema.  Skin: Rash noted.  Slightly raised somewhat urticarial type rash lower legs bilaterally. Left lateral elbow reveals slightly raised slightly scaly rash with similar rash upper back area       Assessment:     #1 hyperthyroidism- on Methimazole.  #2 nonspecific rash- leg rash looks more urticarial and arm rash more eczematous.    Plan:     -Refill triamcinolone 0.1% cream to use twice daily as needed -Avoid scratching as much as possible -Repeat labs with TSH, free T4, and free T3 -Consider referral to local endocrinologist for ongoing management of her hyperthyroidism  Eulas Post MD [facility]

## 2016-03-15 ENCOUNTER — Other Ambulatory Visit: Payer: Self-pay

## 2016-03-15 DIAGNOSIS — E0590 Thyrotoxicosis, unspecified without thyrotoxic crisis or storm: Secondary | ICD-10-CM

## 2016-03-27 ENCOUNTER — Telehealth: Payer: Self-pay | Admitting: Family Medicine

## 2016-04-02 ENCOUNTER — Ambulatory Visit (INDEPENDENT_AMBULATORY_CARE_PROVIDER_SITE_OTHER): Payer: Medicare Other | Admitting: Family Medicine

## 2016-04-02 VITALS — BP 140/80 | HR 64 | Temp 97.9°F | Wt 211.0 lb

## 2016-04-02 DIAGNOSIS — R42 Dizziness and giddiness: Secondary | ICD-10-CM

## 2016-04-02 DIAGNOSIS — E1122 Type 2 diabetes mellitus with diabetic chronic kidney disease: Secondary | ICD-10-CM

## 2016-04-02 DIAGNOSIS — R531 Weakness: Secondary | ICD-10-CM

## 2016-04-02 DIAGNOSIS — E785 Hyperlipidemia, unspecified: Secondary | ICD-10-CM

## 2016-04-02 DIAGNOSIS — N181 Chronic kidney disease, stage 1: Secondary | ICD-10-CM

## 2016-04-02 LAB — BASIC METABOLIC PANEL
BUN: 13 mg/dL (ref 6–23)
CO2: 31 mEq/L (ref 19–32)
Calcium: 9.5 mg/dL (ref 8.4–10.5)
Chloride: 102 mEq/L (ref 96–112)
Creatinine, Ser: 1.06 mg/dL (ref 0.40–1.20)
GFR: 65.29 mL/min (ref >60.00)
Glucose, Bld: 89 mg/dL (ref 70–99)
Potassium: 4.1 mEq/L (ref 3.5–5.1)
Sodium: 141 mEq/L (ref 135–145)

## 2016-04-02 LAB — CBC WITH DIFFERENTIAL/PLATELET
Basophils Absolute: 0 10*3/uL (ref 0.0–0.1)
Basophils Relative: 0.5 % (ref 0.0–3.0)
Eosinophils Absolute: 0.1 10*3/uL (ref 0.0–0.7)
Eosinophils Relative: 3.4 % (ref 0.0–5.0)
HCT: 36.6 % (ref 36.0–46.0)
Hemoglobin: 11.9 g/dL — ABNORMAL LOW (ref 12.0–15.0)
Lymphocytes Relative: 27.2 % (ref 12.0–46.0)
Lymphs Abs: 1.1 10*3/uL (ref 0.7–4.0)
MCHC: 32.5 g/dL (ref 30.0–36.0)
MCV: 76.6 fl — ABNORMAL LOW (ref 78.0–100.0)
Monocytes Absolute: 0.3 10*3/uL (ref 0.1–1.0)
Monocytes Relative: 8.6 % (ref 3.0–12.0)
Neutro Abs: 2.3 10*3/uL (ref 1.4–7.7)
Neutrophils Relative %: 60.3 % (ref 43.0–77.0)
Platelets: 206 10*3/uL (ref 150.0–400.0)
RBC: 4.78 Mil/uL (ref 3.87–5.11)
RDW: 14.8 % (ref 11.5–15.5)
WBC: 3.9 10*3/uL — ABNORMAL LOW (ref 4.0–10.5)

## 2016-04-02 LAB — HEPATIC FUNCTION PANEL
ALT: 13 U/L (ref 0–35)
AST: 19 U/L (ref 0–37)
Albumin: 4.2 g/dL (ref 3.5–5.2)
Alkaline Phosphatase: 116 U/L (ref 39–117)
Bilirubin, Direct: 0.1 mg/dL (ref 0.0–0.3)
Total Bilirubin: 0.6 mg/dL (ref 0.2–1.2)
Total Protein: 6.3 g/dL (ref 6.0–8.3)

## 2016-04-02 LAB — LIPID PANEL
Cholesterol: 258 mg/dL — ABNORMAL HIGH (ref 0–200)
HDL: 83.4 mg/dL (ref >39.00)
LDL Cholesterol: 155 mg/dL — ABNORMAL HIGH (ref 0–99)
NonHDL: 174.88
Total CHOL/HDL Ratio: 3
Triglycerides: 99 mg/dL (ref 0.0–149.0)
VLDL: 19.8 mg/dL (ref 0.0–40.0)

## 2016-04-02 LAB — HEMOGLOBIN A1C: Hgb A1c MFr Bld: 6.6 % — ABNORMAL HIGH (ref 4.6–6.5)

## 2016-04-02 NOTE — Progress Notes (Signed)
Pre visit review using our clinic review tool, if applicable. No additional management support is needed unless otherwise documented below in the visit note. 

## 2016-04-02 NOTE — Progress Notes (Signed)
Subjective:     Patient ID: Dawn Salas, female   DOB: November 04, 1940, 76 y.o.   MRN: [id number]  HPI Patient seen with episodes of generalized weakness. She states for the past several days she has episodes where she feels generally weak all over and after about 15 minutes of lying down feels improved. She does not relate this to any dizziness. No syncopal or presyncopal symptoms. She initially felt her heart rate may be dropping but she's not actually taken her pulse during episodes. No chest pain. She's had some diminishment of appetite. No fevers or chills.  Hyperthyroidism with recent low TSH and elevated free T3. She's on methimazole and is waiting to get into see one of our endocrinologists on the 21st. She's not any recent diarrhea.  History of type 2 diabetes. This has been well controlled. Recent fasting blood sugars low 100s.  She has been able to maintain swimming about 3 times per week without any difficulty. No chest pains.  Past Medical History:  Diagnosis Date  . Allergy   . Anxiety   . Arthritis   . Asthma    patient denies   . Depression   . Diabetes mellitus    Type two  . Diverticulitis   . Family history of adverse reaction to anesthesia    daughter- N/V   . Frequency of urination   . GERD (gastroesophageal reflux disease)   . History of hiatal hernia   . Hyperglycemia   . Hyperlipidemia   . Hypertension   . Hyperthyroidism    PT HAD BIPOSY -NEGATIVE.Marland Kitchen AND NOW JUST GETS CHECKED EACH YEAR .Marland Kitchen 2013 LAST TEST   SEES DR. [name].   . Multiple meningiomas of spine and brain ([place])   . Obstructive sleep apnea    STUDY AT [facility] DOES NOT USE C PAP  . Palpitations   . Peptic ulcer disease   . Pneumonia    "walking pneumonia" hx of  . Seizures ([place])    one siezure with brain surgery none since 2015    Past Surgical History:  Procedure Laterality Date  . ABDOMINAL HYSTERECTOMY    . BACK SURGERY     L SPIN 2003   NECK 2004  . BRAIN MENINGIOMA EXCISION  8/10   X 2   . C5-C6 neck fusion    . COLONOSCOPY    . CRANIOTOMY  08/13/2011   Procedure: CRANIOTOMY TUMOR EXCISION;  Surgeon: [name], MD;  Location: [facility];  Service: Neurosurgery;  Laterality: Bilateral;  Bifrontal craniotomy for tumor  . DILATION AND CURETTAGE OF UTERUS     YEARS AGO  . L4-L5 posterior la    . left foot plantar and hammertoe    . right foot bunectomy    . right knee arthroscopy     x 3  . right shoulder rotator cuff    . ROBOTIC ASSISTED BILATERAL SALPINGO OOPHERECTOMY N/A 08/25/2015   Procedure: XI ROBOTIC ASSISTED BILATERAL SALPINGO OOPHORECTOMY;  Surgeon: [name], MD;  Location: [facility];  Service: Gynecology;  Laterality: N/A;  . TONSILLECTOMY    . TUBAL LIGATION    . WRIST SURGERY Right     reports that she has never smoked. She has never used smokeless tobacco. She reports that she does not drink alcohol or use drugs. family history includes Anesthesia problems in her daughter; Arthritis in her other; Cancer in her mother; Diabetes in her other; Heart disease (age of onset: 11) in her father; Hyperlipidemia in  her other; Hypertension in her other. Allergies  Allergen Reactions  . Flagyl [Metronidazole] Other (See Comments)    Caused increased heart rate  . Aspirin Effervescent Swelling    Alka-Seltzer gel caps- sore mouth, face swollen, turned hands white  . Morphine Sulfate Hives    Hallucinations  . Penicillins Swelling and Rash    Has patient had a PCN reaction causing immediate rash, facial/tongue/throat swelling, SOB or lightheadedness with hypotension: Yes Has patient had a PCN reaction causing severe rash involving mucus membranes or skin necrosis: No Has patient had a PCN reaction that required hospitalization No Has patient had a PCN reaction occurring within the last 10 years: No If all of the above answers are "NO", then may proceed with Cephalosporin use.      Review of Systems  Constitutional: Positive for fatigue.  Eyes:  Negative for visual disturbance.  Respiratory: Negative for cough, chest tightness, shortness of breath and wheezing.   Cardiovascular: Negative for chest pain, palpitations and leg swelling.  Gastrointestinal: Negative for abdominal pain, diarrhea, nausea and vomiting.  Endocrine: Negative for polydipsia and polyuria.  Genitourinary: Negative for dysuria.  Neurological: Positive for weakness. Negative for dizziness, seizures, syncope, light-headedness and headaches.  Hematological: Negative for adenopathy. Does not bruise/bleed easily.       Objective:   Physical Exam  Constitutional: She appears well-developed and well-nourished.  Eyes: Pupils are equal, round, and reactive to light.  Neck: Neck supple. No JVD present. No thyromegaly present.  Cardiovascular: Normal rate and regular rhythm.  Exam reveals no gallop.   Pulmonary/Chest: Effort normal and breath sounds normal. No respiratory distress. She has no wheezes. She has no rales.  Musculoskeletal: She exhibits no edema.  Neurological: She is alert.       Assessment:     #1 hyperthyroidism. Recent low TSH and patient on methimazole. She is in transition between endocrinology follow-up and is planning to see one of our endocrinologists soon.  #2 generalized weakness of uncertain etiology. Possibly related to #1  #3 type 2 diabetes with history of good control  #4 dyslipidemia    Plan:     -Recheck hemoglobin A1c -Check hepatic panel and lipid panel -Endocrinology follow-up as above  Eulas Post MD  Primary Care at [facility]

## 2016-04-09 ENCOUNTER — Ambulatory Visit
Admission: RE | Admit: 2016-04-09 | Discharge: 2016-04-09 | Disposition: A | Discharge status: Home / Self Care | Payer: Medicare Other | Source: Ambulatory Visit | Attending: Family Medicine | Admitting: Family Medicine

## 2016-04-09 DIAGNOSIS — Z1231 Encounter for screening mammogram for malignant neoplasm of breast: Secondary | ICD-10-CM

## 2016-04-11 ENCOUNTER — Ambulatory Visit (INDEPENDENT_AMBULATORY_CARE_PROVIDER_SITE_OTHER): Payer: Medicare Other | Admitting: Endocrinology

## 2016-04-11 ENCOUNTER — Encounter: Payer: Self-pay | Admitting: Endocrinology

## 2016-04-11 VITALS — BP 122/68 | HR 76 | Temp 97.9°F | Ht 64.0 in | Wt 209.2 lb

## 2016-04-11 DIAGNOSIS — E0590 Thyrotoxicosis, unspecified without thyrotoxic crisis or storm: Secondary | ICD-10-CM

## 2016-04-11 DIAGNOSIS — R413 Other amnesia: Secondary | ICD-10-CM | POA: Diagnosis not present

## 2016-04-11 MED ORDER — METHIMAZOLE 5 MG PO TABS: 15.0000 mg | ORAL_TABLET | Freq: Every day | ORAL | 2 refills | Status: DC

## 2016-04-11 NOTE — Patient Instructions (Addendum)
Methimazole 3 daily  Stop Methimazole 1 week before iodine test

## 2016-04-11 NOTE — Progress Notes (Addendum)
Patient ID: Dawn Salas, female   DOB: 1940-11-16, 76 y.o.   MRN: [id number]                                                                                                               Reason for Appointment:  Hyperthyroidism, new consultation  Referring physician: Burchette  Chief complaint: Overactive thyroid   History of Present Illness:   She was diagnosed as having Hyperthyroidism in 2015 when she was having palpitations and was seen to have a goiter. At that time she was having the usual symptoms of fatigue, shakiness, palpitations, heat intolerance and some weight loss No details of this are available She was treated with I-131, unknown dose which was prescribed in March 2015 Apparently she had a large hot nodule on the left, however details of her treatment are not available  Apparently subsequently her hyperthyroidism recurred and methimazole restarted ?  In 4/17 She was last seen by her endocrinologist in 10/17 but not clear what her labs showed. However her free T4 was normal in 12/17 done elsewhere  She had been continued on 7.5 mg methimazole, the dose was increased from 5 mg in 10/2015  For the last few weeks patient has had symptoms of palpitations, shakiness, feeling excessively warm and sweaty, nervousness, and fatigue.  She says her appetite is variable, she sometimes has nausea.  Also at times will have bowel movements frequently during the day specially after meals She has not lost any significant weight recently Previous weight range 199-211  Wt Readings from Last 3 Encounters:  04/11/16 209 lb 3.2 oz (94.9 kg)  04/02/16 211 lb (95.7 kg)  03/12/16 209 lb (94.8 kg)     Treatments so far: I-131, unknown dose, methimazole  Thyroid function tests as follows:     Lab Results  Component Value Date   FREET4 1.41 03/12/2016   T3FREE 5.0 (H) 03/12/2016   TSH 0.02 (L) 03/12/2016   TSH 1.26 04/15/2012   TSH 2.08 11/02/2009    No results found for:  THYROTRECAB   Apparently prior to her being treated for the hot nodule she had a multinodular goiter evaluated with a fine-needle aspiration in 09/2010    Allergies as of 04/11/2016      Reactions   Flagyl [metronidazole] Other (See Comments)   Caused increased heart rate   Aspirin Effervescent Swelling   Alka-Seltzer gel caps- sore mouth, face swollen, turned hands white   Crestor [rosuvastatin]    Morphine Sulfate Hives   Hallucinations   Penicillins Swelling, Rash   Has patient had a PCN reaction causing immediate rash, facial/tongue/throat swelling, SOB or lightheadedness with hypotension: Yes Has patient had a PCN reaction causing severe rash involving mucus membranes or skin necrosis: No Has patient had a PCN reaction that required hospitalization No Has patient had a PCN reaction occurring within the last 10 years: No If all of the above answers are "NO", then may proceed with Cephalosporin use.      Medication List  Accurate as of 04/11/16 10:49 AM. Always use your most recent med list.          accu-chek softclix lancets 1 each by Other route 2 (two) times daily. E11.9   amLODipine 2.5 MG tablet Commonly known as:  NORVASC TAKE ONE (1) TABLET EACH DAY   cetirizine 10 MG tablet Commonly known as:  ZYRTEC Take 10 mg by mouth daily as needed. For allergies   furosemide 40 MG tablet Commonly known as:  LASIX Take 0.5 tablets (20 mg total) by mouth daily as needed. For fluid   GERITOL COMPLETE PO Take 1 tablet by mouth daily.   glucose blood test strip Commonly known as:  ACCU-CHEK AVIVA PLUS 1 each by Other route 2 (two) times daily. E11.9   losartan-hydrochlorothiazide 100-12.5 MG tablet Commonly known as:  HYZAAR TAKE 1 TABLET DAILY   Magnesium 250 MG Tabs Take 250 mg by mouth daily.   metFORMIN 500 MG tablet Commonly known as:  GLUCOPHAGE TAKE ONE TABLET TWICE A DAY WITH FOOD   methimazole 5 MG tablet Commonly known as:  TAPAZOLE Take 7.5  mg by mouth daily.   metoprolol succinate 100 MG 24 hr tablet Commonly known as:  TOPROL-XL TAKE ONE (1) TABLET EACH DAY   potassium chloride 10 MEQ tablet Commonly known as:  K-DUR TAKE ONE (1) TABLET EACH DAY   promethazine 25 MG tablet Commonly known as:  PHENERGAN Take 1 tablet (25 mg total) by mouth every 6 (six) hours as needed for nausea or vomiting.   RABEprazole 20 MG tablet Commonly known as:  ACIPHEX TAKE ONE (1) TABLET EACH DAY   triamcinolone cream 0.1 % Commonly known as:  KENALOG Apply topically 2 (two) times daily as needed.   valACYclovir 1000 MG tablet Commonly known as:  VALTREX TAKE 2 TABLETS EVERY 12 HOURS FOR 1 DAY,AND THEN AS NEEDED   VESICARE 5 MG tablet Generic drug:  solifenacin TAKE 1 TABLET DAILY   VITAMIN B12 PO Take 1 mL by mouth daily. OTC           Past Medical History:  Diagnosis Date  . Allergy   . Anxiety   . Arthritis   . Asthma    patient denies   . Depression   . Diabetes mellitus    Type two  . Diverticulitis   . Family history of adverse reaction to anesthesia    daughter- N/V   . Frequency of urination   . GERD (gastroesophageal reflux disease)   . History of hiatal hernia   . Hyperglycemia   . Hyperlipidemia   . Hypertension   . Hyperthyroidism    PT HAD BIPOSY -NEGATIVE.Marland Kitchen AND NOW JUST GETS CHECKED EACH YEAR .Marland Kitchen 2013 LAST TEST   SEES DR. [name].   . 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 Obstructive sleep apnea    STUDY AT [facility] DOES NOT USE C PAP  . Palpitations   . Peptic ulcer disease   . Pneumonia    "walking pneumonia" hx of  . Seizures (HCC)    one siezure with brain surgery none since 2015     Past Surgical History:  Procedure Laterality Date  . ABDOMINAL HYSTERECTOMY    . BACK SURGERY     L SPIN 2003   NECK 2004  . BRAIN MENINGIOMA EXCISION  8/10   X 2  . C5-C6 neck fusion    . COLONOSCOPY    . CRANIOTOMY  08/13/2011   Procedure:  CRANIOTOMY TUMOR EXCISION;  Surgeon: Cristi Loron, MD;  Location: [facility];  Service: Neurosurgery;  Laterality: Bilateral;  Bifrontal craniotomy for tumor  . DILATION AND CURETTAGE OF UTERUS     YEARS AGO  . L4-L5 posterior la    . left foot plantar and hammertoe    . right foot bunectomy    . right knee arthroscopy     x 3  . right shoulder rotator cuff    . ROBOTIC ASSISTED BILATERAL SALPINGO OOPHERECTOMY N/A 08/25/2015   Procedure: XI ROBOTIC ASSISTED BILATERAL SALPINGO OOPHORECTOMY;  Surgeon: Laurette Schimke, MD;  Location: [facility];  Service: Gynecology;  Laterality: N/A;  . TONSILLECTOMY    . TUBAL LIGATION    . WRIST SURGERY Right     Family History  Problem Relation Age of Onset  . Cancer Mother     ovarian  . Heart disease Father 90  . Arthritis Other   . Hyperlipidemia Other   . Hypertension Other   . Diabetes Other   . Anesthesia problems Daughter     NAUSEA AND VOMITING POST OP  . Breast cancer Neg Hx     Social History:  reports that she has never smoked. She has never used smokeless tobacco. She reports that she does not drink alcohol or use drugs.  Allergies:  Allergies  Allergen Reactions  . Flagyl [Metronidazole] Other (See Comments)    Caused increased heart rate  . Aspirin Effervescent Swelling    Alka-Seltzer gel caps- sore mouth, face swollen, turned hands white  . Crestor [Rosuvastatin]   . Morphine Sulfate Hives    Hallucinations  . Penicillins Swelling and Rash    Has patient had a PCN reaction causing immediate rash, facial/tongue/throat swelling, SOB or lightheadedness with hypotension: Yes Has patient had a PCN reaction causing severe rash involving mucus membranes or skin necrosis: No Has patient had a PCN reaction that required hospitalization No Has patient had a PCN reaction occurring within the last 10 years: No If all of the above answers are "NO", then may proceed with Cephalosporin use.       Review of Systems  Constitutional: Negative for reduced appetite.  HENT:  Negative for trouble swallowing.   Eyes: Negative for visual disturbance.  Respiratory: Negative for shortness of breath.   Cardiovascular: Positive for palpitations. Negative for chest pain.  Gastrointestinal: Positive for nausea.       Has bowel movements after meals, this is new  Endocrine: Positive for fatigue and heat intolerance.  Genitourinary: Negative for frequency.  Musculoskeletal: Negative for muscle aches.  Skin: Positive for rash and itching.       She has had rash on lower legs with thickening of the skin and this is better with the cream prescribed by PCP  Neurological: Positive for tremors.       She has some difficulty remembering since 2017 and at times difficulty in finding the words when talking  Psychiatric/Behavioral: Negative for insomnia.      Examination:   BP 122/68 (BP Location: Left Arm, Patient Position: Sitting, Cuff Size: Normal)   Pulse 76   Temp 97.9 F (36.6 C) (Oral)   Ht 5\' 4"  (1.626 m)   Wt 209 lb 3.2 oz (94.9 kg)   BMI 35.91 kg/m    General Appearance:  well-built and nourished, pleasant, not anxious or hyperkinetic.        Eyes: Mild prominence of the right eye with slight stare on the right side.  Exophthalmos on the right is measuring 21 mm compared to 19 on the left No lid lag No swelling of the eyelids and no chemosis or erythema of the conjunctiva  Neck: The thyroid is enlarged bilaterally although relatively more the right side, it is about 2 times normal, feels nodular especially on the left and is firm  There is no lymphadenopathy .           Heart: normal S1 and S2, no murmurs .          Lungs: breath sounds are clear bilaterally Abdomen: no hepatosplenomegaly or other palpable abnormality  Extremities: hands are warm. No ankle edema. Neurological: Deep tendon reflexes at biceps are brisk. Bilateral fine tremors are present.  Skin: She has thickening and light irregular patchy pigmentation of the lower parts of legs  anteriorly   Assessment/Plan:   Hyperthyroidism Possibly from Graves' disease   Although she does have a history of thyroid nodules clinically she now appears to be having Graves' disease with her mild eye signs and possible Graves'  dermopathy on her legs She has had persistent hyperthyroidism despite a previous I-131 dose about 2 years ago but not clear how much she was given Previous records of uptake and scan are not available  She has had worsening of her hyperthyroidism recently with symptoms and abnormal thyroid levels especially T3 level Continues to have a goiter although not having a significant enlargement of the left side where previously she had a 3.9 cm  nodule which apparently was toxic.  May well have developed Graves' disease as a new condition   Recommendations: To request records of her previous I-131 treatment and other radiological studies from 2015  Increase methimazole to 15 mg Thyrotropin receptor antibody to be done today I-131 uptake and scan to be done in preparation for I-131 treatment  She is aware of the modalities of treatment and agreeable to doing this Given instructions on stopping and restarting methimazole around the times of her I-131 studies and treatment These were reviewed on her printout She will resume the same dose of methimazole 3 days after the I-131 treatment is done Follow-up in 6 weeks  She may have mild dementia and she needs to discuss this with her PCP, discussed that this is likely to be related to her hyperthyroidism  Consult note sent to referring physician  Patient Instructions  Methimazole 3 daily  Stop Methimazole 1 week before iodine test    Total visit time for reviewing patient's previous records, labs, evaluation and management, counseling and coordination of care = 60 minutes  Lyrik Dockstader 04/11/2016, 10:49 AM   Addendum:   Labs:   Has Graves disease from scan and TRAb  Old cold nodule on left previously  biopsied and benign  Lab Results  Component Value Date   THYROTRECAB 87.70 (H) 04/11/2016

## 2016-04-13 ENCOUNTER — Telehealth: Payer: Self-pay | Admitting: Endocrinology

## 2016-04-13 LAB — THYROTROPIN RECEPTOR AUTOABS: Thyrotropin Receptor Ab: 87.7 IU/L — ABNORMAL HIGH (ref 0.00–1.75)

## 2016-04-13 NOTE — Telephone Encounter (Signed)
Pt wants to know results of bloodwork from 3/21 please call pt back

## 2016-04-17 ENCOUNTER — Encounter (HOSPITAL_COMMUNITY)
Admission: RE | Admit: 2016-04-17 | Discharge: 2016-04-17 | Disposition: A | Discharge status: Home / Self Care | Payer: Medicare Other | Source: Ambulatory Visit | Attending: Endocrinology | Admitting: Endocrinology

## 2016-04-17 DIAGNOSIS — E0590 Thyrotoxicosis, unspecified without thyrotoxic crisis or storm: Secondary | ICD-10-CM | POA: Insufficient documentation

## 2016-04-17 MED ORDER — SODIUM IODIDE I 131 CAPSULE
13.4000 | Freq: Once | INTRAVENOUS | Status: AC | PRN
  Administered 2016-04-17: 13.4 via ORAL

## 2016-04-17 NOTE — Telephone Encounter (Signed)
Jan from [facility], need patient TSH results fax to her. Fax # [phone]

## 2016-04-17 NOTE — Telephone Encounter (Signed)
This has been resolved

## 2016-04-18 ENCOUNTER — Encounter (HOSPITAL_COMMUNITY)
Admission: RE | Admit: 2016-04-18 | Discharge: 2016-04-18 | Disposition: A | Discharge status: Home / Self Care | Payer: Medicare Other | Source: Ambulatory Visit | Attending: Endocrinology | Admitting: Endocrinology

## 2016-04-18 DIAGNOSIS — E0590 Thyrotoxicosis, unspecified without thyrotoxic crisis or storm: Secondary | ICD-10-CM | POA: Diagnosis present

## 2016-04-18 MED ORDER — SODIUM PERTECHNETATE TC 99M INJECTION
10.6000 | Freq: Once | INTRAVENOUS | Status: AC | PRN
  Administered 2016-04-18: 10.6 via INTRAVENOUS

## 2016-05-03 ENCOUNTER — Other Ambulatory Visit: Payer: Self-pay | Admitting: Family Medicine

## 2016-05-23 ENCOUNTER — Encounter: Payer: Self-pay | Admitting: Endocrinology

## 2016-05-23 ENCOUNTER — Ambulatory Visit (INDEPENDENT_AMBULATORY_CARE_PROVIDER_SITE_OTHER): Payer: Medicare Other | Admitting: Endocrinology

## 2016-05-23 VITALS — BP 128/76 | HR 74 | Ht 64.0 in | Wt 206.6 lb

## 2016-05-23 DIAGNOSIS — E0590 Thyrotoxicosis, unspecified without thyrotoxic crisis or storm: Secondary | ICD-10-CM

## 2016-05-23 LAB — T3, FREE: T3, Free: 3.6 pg/mL (ref 2.3–4.2)

## 2016-05-23 LAB — T4, FREE: Free T4: 0.81 ng/dL (ref 0.60–1.60)

## 2016-05-23 NOTE — Progress Notes (Signed)
Please call to let patient know that the lab results are normal and no change in methimazole needed

## 2016-05-23 NOTE — Progress Notes (Signed)
Patient ID: Dawn Salas, female   DOB: July 23, 1940, 76 y.o.   MRN: [id number]                                                                                                               Reason for Appointment:  Hyperthyroidism, follow-up  Referring physician: Burchette   Chief complaint: Overactive thyroid   History of Present Illness:   History obtained on initial consultation:  She was diagnosed as having Hyperthyroidism in 2015 when she was having palpitations and was seen to have a goiter. At that time she was having the usual symptoms of fatigue, shakiness, palpitations, heat intolerance and some weight loss No details of this are available She was treated with I-131, unknown dose which was prescribed in March 2015 Apparently she had a large hot nodule on the left, however details of her treatment are not available  Apparently subsequently her hyperthyroidism recurred and methimazole restarted ?  In 4/17 Also the dose was increased up to 7.5 mg in 10/2015  Apparently prior to her being treated for the hot nodule she had a multinodular goiter evaluated with a fine-needle aspiration in 09/2010  RECENT history: Since she was significantly hypothyroid on her initial exam in February and free T3 was 5.0 she was told to take 15 mg methimazole However she is taking only 10 mg daily now She does complain of feeling hot at times and somewhat fatigued but does not have the palpitations and usually not feeling shaky She has been gradually losing weight  Previous weight range 199-211  Wt Readings from Last 3 Encounters:  05/23/16 206 lb 9.6 oz (93.7 kg)  04/11/16 209 lb 3.2 oz (94.9 kg)  04/02/16 211 lb (95.7 kg)      Thyroid function tests as follows:     Lab Results  Component Value Date   FREET4 0.81 05/23/2016   FREET4 1.41 03/12/2016   T3FREE 3.6 05/23/2016   T3FREE 5.0 (H) 03/12/2016   TSH 0.02 (L) 03/12/2016   TSH 1.26 04/15/2012   TSH 2.08 11/02/2009    Lab  Results  Component Value Date   THYROTRECAB 87.70 (H) 04/11/2016   I-131 uptake is 60%      Allergies as of 05/23/2016      Reactions   Flagyl [metronidazole] Other (See Comments)   Caused increased heart rate   Aspirin Effervescent Swelling   Alka-Seltzer gel caps- sore mouth, face swollen, turned hands white   Crestor [rosuvastatin]    Morphine Sulfate Hives   Hallucinations   Penicillins Swelling, Rash   Has patient had a PCN reaction causing immediate rash, facial/tongue/throat swelling, SOB or lightheadedness with hypotension: Yes Has patient had a PCN reaction causing severe rash involving mucus membranes or skin necrosis: No Has patient had a PCN reaction that required hospitalization No Has patient had a PCN reaction occurring within the last 10 years: No If all of the above answers are "NO", then may proceed with Cephalosporin use.      Medication List  Accurate as of 05/23/16  9:31 PM. Always use your most recent med list.          accu-chek softclix lancets 1 each by Other route 2 (two) times daily. E11.9   amLODipine 2.5 MG tablet Commonly known as:  NORVASC TAKE ONE (1) TABLET EACH DAY   cetirizine 10 MG tablet Commonly known as:  ZYRTEC Take 10 mg by mouth daily as needed. For allergies   furosemide 40 MG tablet Commonly known as:  LASIX Take 0.5 tablets (20 mg total) by mouth daily as needed. For fluid   GERITOL COMPLETE PO Take 1 tablet by mouth daily.   glucose blood test strip Commonly known as:  ACCU-CHEK AVIVA PLUS 1 each by Other route 2 (two) times daily. E11.9   losartan-hydrochlorothiazide 100-12.5 MG tablet Commonly known as:  HYZAAR TAKE 1 TABLET DAILY   Magnesium 250 MG Tabs Take 250 mg by mouth daily.   metFORMIN 500 MG tablet Commonly known as:  GLUCOPHAGE TAKE ONE TABLET TWICE A DAY WITH FOOD   methimazole 5 MG tablet Commonly known as:  TAPAZOLE Take 3 tablets (15 mg total) by mouth daily.   metoprolol succinate  100 MG 24 hr tablet Commonly known as:  TOPROL-XL TAKE ONE (1) TABLET EACH DAY   potassium chloride 10 MEQ tablet Commonly known as:  K-DUR TAKE ONE (1) TABLET EACH DAY   promethazine 25 MG tablet Commonly known as:  PHENERGAN Take 1 tablet (25 mg total) by mouth every 6 (six) hours as needed for nausea or vomiting.   RABEprazole 20 MG tablet Commonly known as:  ACIPHEX TAKE ONE (1) TABLET EACH DAY   triamcinolone cream 0.1 % Commonly known as:  KENALOG Apply topically 2 (two) times daily as needed.   valACYclovir 1000 MG tablet Commonly known as:  VALTREX TAKE 2 TABLETS EVERY 12 HOURS FOR 1 DAY,AND THEN AS NEEDED   VESICARE 5 MG tablet Generic drug:  solifenacin TAKE 1 TABLET DAILY   VITAMIN B12 PO Take 1 mL by mouth daily. OTC           Past Medical History:  Diagnosis Date  . Allergy   . Anxiety   . Arthritis   . Asthma    patient denies   . Depression   . Diabetes mellitus    Type two  . Diverticulitis   . Family history of adverse reaction to anesthesia    daughter- N/V   . Frequency of urination   . GERD (gastroesophageal reflux disease)   . History of hiatal hernia   . Hyperglycemia   . Hyperlipidemia   . Hypertension   . Hyperthyroidism    PT HAD BIPOSY -NEGATIVE.Marland Kitchen AND NOW JUST GETS CHECKED EACH YEAR .Marland Kitchen 2013 LAST TEST   SEES DR. [name].   . 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 Obstructive sleep apnea    STUDY AT [facility] DOES NOT USE C PAP  . Palpitations   . Peptic ulcer disease   . Pneumonia    "walking pneumonia" hx of  . Seizures (HCC)    one siezure with brain surgery none since 2015     Past Surgical History:  Procedure Laterality Date  . ABDOMINAL HYSTERECTOMY    . BACK SURGERY     L SPIN 2003   NECK 2004  . BRAIN MENINGIOMA EXCISION  8/10   X 2  . C5-C6 neck fusion    . COLONOSCOPY    . CRANIOTOMY  08/13/2011   Procedure: CRANIOTOMY TUMOR EXCISION;  Surgeon: Cristi Loron, MD;  Location: [facility];  Service:  Neurosurgery;  Laterality: Bilateral;  Bifrontal craniotomy for tumor  . DILATION AND CURETTAGE OF UTERUS     YEARS AGO  . L4-L5 posterior la    . left foot plantar and hammertoe    . right foot bunectomy    . right knee arthroscopy     x 3  . right shoulder rotator cuff    . ROBOTIC ASSISTED BILATERAL SALPINGO OOPHERECTOMY N/A 08/25/2015   Procedure: XI ROBOTIC ASSISTED BILATERAL SALPINGO OOPHORECTOMY;  Surgeon: Laurette Schimke, MD;  Location: [facility];  Service: Gynecology;  Laterality: N/A;  . TONSILLECTOMY    . TUBAL LIGATION    . WRIST SURGERY Right     Family History  Problem Relation Age of Onset  . Cancer Mother     ovarian  . Heart disease Father 16  . Arthritis Other   . Hyperlipidemia Other   . Hypertension Other   . Diabetes Other   . Anesthesia problems Daughter     NAUSEA AND VOMITING POST OP  . Breast cancer Neg Hx     Social History:  reports that she has never smoked. She has never used smokeless tobacco. She reports that she does not drink alcohol or use drugs.  Allergies:  Allergies  Allergen Reactions  . Flagyl [Metronidazole] Other (See Comments)    Caused increased heart rate  . Aspirin Effervescent Swelling    Alka-Seltzer gel caps- sore mouth, face swollen, turned hands white  . Crestor [Rosuvastatin]   . Morphine Sulfate Hives    Hallucinations  . Penicillins Swelling and Rash    Has patient had a PCN reaction causing immediate rash, facial/tongue/throat swelling, SOB or lightheadedness with hypotension: Yes Has patient had a PCN reaction causing severe rash involving mucus membranes or skin necrosis: No Has patient had a PCN reaction that required hospitalization No Has patient had a PCN reaction occurring within the last 10 years: No If all of the above answers are "NO", then may proceed with Cephalosporin use.       Review of Systems  In the last few weeks has had some itching on her upper chest and arms with a find rash at times, taking  Zyrtec and Benadryl as needed Itching on the lower legs is somewhat better with triamcinolone previously prescribed but her rash is not improving   Examination:   BP 128/76   Pulse 74   Ht 5\' 4"  (1.626 m)   Wt 206 lb 9.6 oz (93.7 kg)   SpO2 98%   BMI 35.46 kg/m   She is not anxious  Eyes: Mild prominence of the Eyes, more on the right  Neck: The thyroid is enlarged bilaterally, about 1-1/2-2 times normal, more on the right side and firm Left side feels nodular Neurological: Deep tendon reflexes at biceps are normal, no tremor  Skin: She has thickening and light irregular patchy pigmentation of the lower parts of legs anteriorly   Assessment/Plan:   Hyperthyroidism  from Graves' disease   Although she does have a history of thyroid nodules including hot and cover nodules in the past she does not appear to have Graves' disease She has a markedly increased thyrotropin receptor antibody Has mild symptoms of hyperthyroidism clinically again now although not tachycardic Is taking 10 mg methimazole currently Has moderate thyroid enlargement  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  History of old left lower lobe thyroid nodule: This was benign on biopsy previously and does not need further evaluation, is cold on her scan  Recommendations:  Check thyroid levels again today She will treat treated with I-131 treatment because of persistent and recurrent Hyperthyroidism and very high thyrotropin antibody; also may be getting a rash and itching with methimazole now  She will start methimazole 5-7 days before her treatment and will not restart subsequently unless thyroid levels are very high today  Patient Instructions  Stop Methimazole 5-7 days before Rx    Levin Dagostino 05/23/2016, 9:31 PM    Addendum: Thyroid levels are normal, she will continue 10 mg methimazole until I-131 treatment

## 2016-05-23 NOTE — Patient Instructions (Signed)
Stop Methimazole 5-7 days before Rx

## 2016-06-04 ENCOUNTER — Telehealth: Payer: Self-pay | Admitting: Endocrinology

## 2016-06-04 ENCOUNTER — Encounter (HOSPITAL_COMMUNITY)
Admission: RE | Admit: 2016-06-04 | Discharge: 2016-06-04 | Disposition: A | Discharge status: Home / Self Care | Payer: Medicare Other | Source: Ambulatory Visit | Attending: Endocrinology | Admitting: Endocrinology

## 2016-06-04 DIAGNOSIS — E0590 Thyrotoxicosis, unspecified without thyrotoxic crisis or storm: Secondary | ICD-10-CM | POA: Diagnosis present

## 2016-06-04 MED ORDER — SODIUM IODIDE I 131 CAPSULE
20.3000 | Freq: Once | INTRAVENOUS | Status: AC | PRN
  Administered 2016-06-04: 20.3 via ORAL

## 2016-06-04 NOTE — Telephone Encounter (Signed)
In 3 days

## 2016-06-04 NOTE — Telephone Encounter (Signed)
Pt had the RAI treatment today when is she to start back on the methimazole

## 2016-06-04 NOTE — Telephone Encounter (Signed)
Patient notified of message via voicemail. Requested a call back if the patient would like to discuss further.  

## 2016-06-04 NOTE — Telephone Encounter (Signed)
See message and please advise.  

## 2016-06-05 NOTE — Telephone Encounter (Signed)
Called to see if pt wanted to schedule awv - left message.  

## 2016-06-09 ENCOUNTER — Other Ambulatory Visit: Payer: Self-pay | Admitting: Family Medicine

## 2016-06-25 ENCOUNTER — Telehealth: Payer: Self-pay | Admitting: Endocrinology

## 2016-06-25 NOTE — Telephone Encounter (Signed)
Patient asked for call from providers nurse. Had questions about how she was feeling from radiation and her next appointment.

## 2016-06-25 NOTE — Telephone Encounter (Signed)
Called patient to advise her of Dr. Ronnie Derby message.

## 2016-06-25 NOTE — Telephone Encounter (Signed)
She should go to urgent care center or see her PCP, this is not related to I-131 treatment

## 2016-06-25 NOTE — Telephone Encounter (Signed)
Called patient and she stated that she feels weak, out of breath, and feels light headed and feels like she may pass out. Her blood sugars are normal. She asked if she could be having a reaction to her radiation? Also she has broken out all over her shoulders and legs and itching, also on her neck. She stated that this is worse when she is standing. She wanted to know if this is coming from her Thyroid? I asked her to take her BP when she has this feeling also. Please advise.

## 2016-06-27 ENCOUNTER — Other Ambulatory Visit: Payer: Self-pay | Admitting: Family Medicine

## 2016-06-28 ENCOUNTER — Other Ambulatory Visit: Payer: Self-pay | Admitting: Endocrinology

## 2016-06-28 ENCOUNTER — Other Ambulatory Visit: Payer: Self-pay | Admitting: Family Medicine

## 2016-07-06 ENCOUNTER — Encounter: Payer: Self-pay | Admitting: Endocrinology

## 2016-07-06 ENCOUNTER — Ambulatory Visit (INDEPENDENT_AMBULATORY_CARE_PROVIDER_SITE_OTHER): Payer: Medicare Other | Admitting: Endocrinology

## 2016-07-06 VITALS — BP 142/92 | HR 75 | Ht 64.0 in | Wt 206.4 lb

## 2016-07-06 DIAGNOSIS — E0590 Thyrotoxicosis, unspecified without thyrotoxic crisis or storm: Secondary | ICD-10-CM

## 2016-07-06 LAB — TSH: TSH: <0.01 u[IU]/mL — ABNORMAL LOW (ref 0.35–4.50)

## 2016-07-06 LAB — T3, FREE: T3, Free: 3.1 pg/mL (ref 2.3–4.2)

## 2016-07-06 LAB — T4, FREE: Free T4: 0.71 ng/dL (ref 0.60–1.60)

## 2016-07-06 NOTE — Progress Notes (Signed)
Patient ID: Dawn Salas, female   DOB: [birth year], 76 y.o.   MRN: [id number]                                                                                                               Reason for Appointment:  Hyperthyroidism, follow-up  Referring physician: Burchette   Chief complaint: Overactive thyroid   History of Present Illness:   History obtained on initial consultation:  She was diagnosed as having Hyperthyroidism in 2015 when she was having palpitations and was seen to have a goiter. At that time she was having the usual symptoms of fatigue, shakiness, palpitations, heat intolerance and some weight loss No details of this are available She was treated with I-131, unknown dose which was prescribed in March 2015 Apparently she had a large hot nodule on the left, however details of her treatment are not available  Apparently subsequently her hyperthyroidism recurred and methimazole restarted ?  In 4/17 Also the dose was increased up to 7.5 mg in 10/2015  Apparently prior to her being treated for the hot nodule she had a multinodular goiter evaluated with a fine-needle aspiration in 09/2010  RECENT history: Since she was significantly hyperthyroid on her initial exam in February and free T3 was 5.0   Since then she has been taking 15 mg methimazole  Because of persistent hyperthyroidism and very high thyrotropin receptor antibody she was given I-131 treatment with 20.3 mCi on 06/04/16 More recently she has started feeling less tired and has not had as much heat intolerance Weight is stable  She does notice that she is getting some diplopia when looking to her left  Previous weight range 199-211  Wt Readings from Last 3 Encounters:  07/06/16 206 lb 6.4 oz (93.6 kg)  05/23/16 206 lb 9.6 oz (93.7 kg)  04/11/16 209 lb 3.2 oz (94.9 kg)      Thyroid function tests as follows:     Lab Results  Component Value Date   FREET4 0.81 05/23/2016   FREET4 1.41 03/12/2016   T3FREE 3.6 05/23/2016   T3FREE 5.0 (H) 03/12/2016   TSH 0.02 (L) 03/12/2016   TSH 1.26 04/15/2012   TSH 2.08 11/02/2009    Lab Results  Component Value Date   THYROTRECAB 87.70 (H) 04/11/2016   I-131 uptake 60%      Allergies as of 07/06/2016      Reactions   Flagyl [metronidazole] Other (See Comments)   Caused increased heart rate   Aspirin Effervescent Swelling   Alka-Seltzer gel caps- sore mouth, face swollen, turned hands white   Crestor [rosuvastatin]    Morphine Sulfate Hives   Hallucinations   Penicillins Swelling, Rash   Has patient had a PCN reaction causing immediate rash, facial/tongue/throat swelling, SOB or lightheadedness with hypotension: Yes Has patient had a PCN reaction causing severe rash involving mucus membranes or skin necrosis: No Has patient had a PCN reaction that required hospitalization No Has patient had a PCN reaction occurring within the last 10 years: No If all  of the above answers are "NO", then may proceed with Cephalosporin use.      Medication List       Accurate as of 07/06/16  2:55 PM. Always use your most recent med list.          accu-chek softclix lancets 1 each by Other route 2 (two) times daily. E11.9   amLODipine 2.5 MG tablet Commonly known as:  NORVASC TAKE ONE (1) TABLET EACH DAY   cetirizine 10 MG tablet Commonly known as:  ZYRTEC Take 10 mg by mouth daily as needed. For allergies   furosemide 40 MG tablet Commonly known as:  LASIX Take 0.5 tablets (20 mg total) by mouth daily as needed. For fluid   GERITOL COMPLETE PO Take 1 tablet by mouth daily.   glucose blood test strip Commonly known as:  ACCU-CHEK AVIVA PLUS 1 each by Other route 2 (two) times daily. E11.9   losartan-hydrochlorothiazide 100-12.5 MG tablet Commonly known as:  HYZAAR TAKE 1 TABLET DAILY   Magnesium 250 MG Tabs Take 250 mg by mouth daily.   metFORMIN 500 MG tablet Commonly known as:  GLUCOPHAGE TAKE ONE TABLET TWICE A DAY WITH  FOOD   methimazole 5 MG tablet Commonly known as:  TAPAZOLE TAKE 3 TABLETS DAILY   metoprolol succinate 100 MG 24 hr tablet Commonly known as:  TOPROL-XL TAKE ONE (1) TABLET EACH DAY   potassium chloride 10 MEQ tablet Commonly known as:  K-DUR TAKE ONE (1) TABLET EACH DAY   promethazine 25 MG tablet Commonly known as:  PHENERGAN Take 1 tablet (25 mg total) by mouth every 6 (six) hours as needed for nausea or vomiting.   RABEprazole 20 MG tablet Commonly known as:  ACIPHEX TAKE ONE (1) TABLET EACH DAY   triamcinolone cream 0.1 % Commonly known as:  KENALOG Apply topically 2 (two) times daily as needed.   valACYclovir 1000 MG tablet Commonly known as:  VALTREX TAKE 2 TABLETS EVERY 12 HOURS FOR 1 DAY,AND THEN AS NEEDED   VESICARE 5 MG tablet Generic drug:  solifenacin TAKE 1 TABLET DAILY   VITAMIN B12 PO Take 1 mL by mouth daily. OTC           Past Medical History:  Diagnosis Date  . Allergy   . Anxiety   . Arthritis   . Asthma    patient denies   . Depression   . Diabetes mellitus    Type two  . Diverticulitis   . Family history of adverse reaction to anesthesia    daughter- N/V   . Frequency of urination   . GERD (gastroesophageal reflux disease)   . History of hiatal hernia   . Hyperglycemia   . Hyperlipidemia   . Hypertension   . Hyperthyroidism    PT HAD BIPOSY -NEGATIVE.Marland Kitchen AND NOW JUST GETS CHECKED EACH YEAR .Marland Kitchen 2013 LAST TEST   SEES DR. [name].   . 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 Obstructive sleep apnea    STUDY AT [facility] DOES NOT USE C PAP  . Palpitations   . Peptic ulcer disease   . Pneumonia    "walking pneumonia" hx of  . Seizures (HCC)    one siezure with brain surgery none since 2015     Past Surgical History:  Procedure Laterality Date  . ABDOMINAL HYSTERECTOMY    . BACK SURGERY     L SPIN 2003   NECK 2004  . BRAIN MENINGIOMA EXCISION  8/10  X 2  . C5-C6 neck fusion    . COLONOSCOPY    . CRANIOTOMY  08/13/2011    Procedure: CRANIOTOMY TUMOR EXCISION;  Surgeon: Cristi Loron, MD;  Location: [facility];  Service: Neurosurgery;  Laterality: Bilateral;  Bifrontal craniotomy for tumor  . DILATION AND CURETTAGE OF UTERUS     YEARS AGO  . L4-L5 posterior la    . left foot plantar and hammertoe    . right foot bunectomy    . right knee arthroscopy     x 3  . right shoulder rotator cuff    . ROBOTIC ASSISTED BILATERAL SALPINGO OOPHERECTOMY N/A 08/25/2015   Procedure: XI ROBOTIC ASSISTED BILATERAL SALPINGO OOPHORECTOMY;  Surgeon: Laurette Schimke, MD;  Location: [facility];  Service: Gynecology;  Laterality: N/A;  . TONSILLECTOMY    . TUBAL LIGATION    . WRIST SURGERY Right     Family History  Problem Relation Age of Onset  . Cancer Mother        ovarian  . Heart disease Father 48  . Arthritis Other   . Hyperlipidemia Other   . Hypertension Other   . Diabetes Other   . Anesthesia problems Daughter        NAUSEA AND VOMITING POST OP  . Breast cancer Neg Hx     Social History:  reports that she has never smoked. She has never used smokeless tobacco. She reports that she does not drink alcohol or use drugs.  Allergies:  Allergies  Allergen Reactions  . Flagyl [Metronidazole] Other (See Comments)    Caused increased heart rate  . Aspirin Effervescent Swelling    Alka-Seltzer gel caps- sore mouth, face swollen, turned hands white  . Crestor [Rosuvastatin]   . Morphine Sulfate Hives    Hallucinations  . Penicillins Swelling and Rash    Has patient had a PCN reaction causing immediate rash, facial/tongue/throat swelling, SOB or lightheadedness with hypotension: Yes Has patient had a PCN reaction causing severe rash involving mucus membranes or skin necrosis: No Has patient had a PCN reaction that required hospitalization No Has patient had a PCN reaction occurring within the last 10 years: No If all of the above answers are "NO", then may proceed with Cephalosporin use.       Review of  Systems   She was given triamcinolone by her PCP for itching on her legs and she has not refilled this, still having some itching and thickening of the leg  DIABETES: She has brought her monitor for download She is checking readings mostly in the morning and she feels a little shaky in the morning because of some readings in the 70s Has not had recent A1c from PCP   Examination:   BP (!) 142/92   Pulse 75   Ht 5\' 4"  (1.626 m)   Wt 206 lb 6.4 oz (93.6 kg)   SpO2 96%   BMI 35.43 kg/m    Eyes: Mild prominence of the Eyes, Has mild deviation of the left eye medially and she appeared to have diplopia when looking to the left  Neck: The thyroid is Not clearly palpable: Deep tendon reflexes at biceps are normal, no tremor  Skin: She has thickening and light irregular patchy pigmentation of the lower parts of legs anteriorly with mild while shows discoloration   Assessment/Plan:   Hyperthyroidism  from Graves' disease   She has had radioactive iodine treatment about a month ago Clinically doing well and her thyroid enlargement is  resolving  She also has mild Graves ophthalmopathy and now appears to have some diplopia Objectively does not have any worsening of her proptosis Discussed that steroids may not be reliably useful and would cause potentially high sugars and other side effects She will discuss with PCP and ophthalmologist   Recommendations:  Check thyroid levels again today She will stop methimazole She will also need to follow-up in another month and discussed possible symptoms of hypothyroidism that may occur in the next 3-4 weeks  For her diabetes recommend that she does not take metformin at that time but take it at suppertime since she has low normal readings fasting and a reading of over 160 after supper  There are no Patient Instructions on file for this visit.  Dawn Salas 07/06/2016, 2:55 PM

## 2016-07-07 NOTE — Progress Notes (Signed)
Please call to let patient know that the lab results are normal and stay off methimazole as directed

## 2016-07-09 ENCOUNTER — Telehealth: Payer: Self-pay

## 2016-07-09 NOTE — Telephone Encounter (Signed)
Called patient and advisied of lab results from previous. Patient had no questions.

## 2016-08-06 ENCOUNTER — Other Ambulatory Visit: Payer: Self-pay | Admitting: Family Medicine

## 2016-08-08 ENCOUNTER — Ambulatory Visit (INDEPENDENT_AMBULATORY_CARE_PROVIDER_SITE_OTHER): Payer: Medicare Other | Admitting: Endocrinology

## 2016-08-08 ENCOUNTER — Telehealth: Payer: Self-pay | Admitting: Endocrinology

## 2016-08-08 ENCOUNTER — Encounter: Payer: Self-pay | Admitting: Endocrinology

## 2016-08-08 ENCOUNTER — Other Ambulatory Visit: Payer: Self-pay | Admitting: Endocrinology

## 2016-08-08 VITALS — BP 110/74 | HR 70 | Ht 64.0 in | Wt 208.2 lb

## 2016-08-08 DIAGNOSIS — E1169 Type 2 diabetes mellitus with other specified complication: Secondary | ICD-10-CM | POA: Diagnosis not present

## 2016-08-08 DIAGNOSIS — E669 Obesity, unspecified: Secondary | ICD-10-CM | POA: Diagnosis not present

## 2016-08-08 DIAGNOSIS — E0590 Thyrotoxicosis, unspecified without thyrotoxic crisis or storm: Secondary | ICD-10-CM

## 2016-08-08 LAB — HEMOGLOBIN A1C: Hgb A1c MFr Bld: 6.6 % — ABNORMAL HIGH (ref 4.6–6.5)

## 2016-08-08 LAB — BASIC METABOLIC PANEL
BUN: 16 mg/dL (ref 6–23)
CO2: 26 mEq/L (ref 19–32)
Calcium: 9.4 mg/dL (ref 8.4–10.5)
Chloride: 100 mEq/L (ref 96–112)
Creatinine, Ser: 1.36 mg/dL — ABNORMAL HIGH (ref 0.40–1.20)
GFR: 48.92 mL/min — ABNORMAL LOW (ref >60.00)
Glucose, Bld: 99 mg/dL (ref 70–99)
Potassium: 4.2 mEq/L (ref 3.5–5.1)
Sodium: 136 mEq/L (ref 135–145)

## 2016-08-08 LAB — TSH: TSH: 20.25 u[IU]/mL — ABNORMAL HIGH (ref 0.35–4.50)

## 2016-08-08 LAB — T4, FREE: Free T4: 0.44 ng/dL — ABNORMAL LOW (ref 0.60–1.60)

## 2016-08-08 MED ORDER — LEVOTHYROXINE SODIUM 125 MCG PO TABS
125.0000 ug | ORAL_TABLET | Freq: Every day | ORAL | 3 refills | Status: DC
Start: 2016-08-08 — End: 2016-11-21

## 2016-08-08 NOTE — Telephone Encounter (Signed)
Patient stated that she feels like something is crawling under her skin, tingling feeling. She is thinking it could be the medication.  Please advise

## 2016-08-08 NOTE — Progress Notes (Signed)
Patient ID: Dawn Salas, female   DOB: [birth year], 76 y.o.   MRN: [id number]                                                                                                                  Chief complaint: thyroid follow-up   History of Present Illness:   History obtained on initial consultation:  She was diagnosed as having Hyperthyroidism in 2015 when she was having palpitations and was seen to have a goiter. At that time she was having the usual symptoms of fatigue, shakiness, palpitations, heat intolerance and some weight loss No details of this are available She was treated with I-131, unknown dose which was prescribed in March 2015 Apparently she had a large hot nodule on the left, however details of her treatment are not available  Apparently subsequently her hyperthyroidism recurred and methimazole restarted ?  In 4/17 Also the dose was increased up to 7.5 mg in 10/2015  Apparently prior to her being treated for the hot nodule she had a multinodular goiter evaluated with a fine-needle aspiration in 09/2010  RECENT history: Since she was significantly hyperthyroid on her initial exam in February and free T3 was 5.0 and was treated with methimazole  Because of persistent hyperthyroidism and very high thyrotropin receptor antibody she was given I-131 treatment with 20.3 mCi on 06/04/16  More recently she has started feeling much more tired for about 2 weeks She also has been little more sensitive to cold Has had some cramps in her muscles including at night more than usual  She has noticed some bruising as well as feeling of something crawling on the skin Weight is up slightly  She is still some diplopia when looking to her left and was told by the ophthalmologist that this is diabetes related and not Graves' disease  Previous weight range 199-211  Wt Readings from Last 3 Encounters:  08/08/16 208 lb 3.2 oz (94.4 kg)  07/06/16 206 lb 6.4 oz (93.6 kg)  05/23/16 206 lb  9.6 oz (93.7 kg)      Thyroid function tests as follows:     Lab Results  Component Value Date   FREET4 0.71 07/06/2016   FREET4 0.81 05/23/2016   FREET4 1.41 03/12/2016   T3FREE 3.1 07/06/2016   T3FREE 3.6 05/23/2016   T3FREE 5.0 (H) 03/12/2016   TSH <0.01 (L) 07/06/2016   TSH 0.02 (L) 03/12/2016   TSH 1.26 04/15/2012    Lab Results  Component Value Date   THYROTRECAB 87.70 (H) 04/11/2016   I-131 uptake 60%      Allergies as of 08/08/2016      Reactions   Flagyl [metronidazole] Other (See Comments)   Caused increased heart rate   Aspirin Effervescent Swelling   Alka-Seltzer gel caps- sore mouth, face swollen, turned hands white   Crestor [rosuvastatin]    Morphine Sulfate Hives   Hallucinations   Penicillins Swelling, Rash   Has patient had a PCN reaction causing immediate rash, facial/tongue/throat swelling, SOB or  lightheadedness with hypotension: Yes Has patient had a PCN reaction causing severe rash involving mucus membranes or skin necrosis: No Has patient had a PCN reaction that required hospitalization No Has patient had a PCN reaction occurring within the last 10 years: No If all of the above answers are "NO", then may proceed with Cephalosporin use.      Medication List       Accurate as of 08/08/16 10:58 AM. Always use your most recent med list.          ACCU-CHEK AVIVA PLUS test strip Generic drug:  glucose blood TEST BLOOD SUGAR TWICE DAILY.   accu-chek softclix lancets 1 each by Other route 2 (two) times daily. E11.9   amLODipine 2.5 MG tablet Commonly known as:  NORVASC TAKE ONE (1) TABLET EACH DAY   cetirizine 10 MG tablet Commonly known as:  ZYRTEC Take 10 mg by mouth daily as needed. For allergies   furosemide 40 MG tablet Commonly known as:  LASIX Take 0.5 tablets (20 mg total) by mouth daily as needed. For fluid   GERITOL COMPLETE PO Take 1 tablet by mouth daily.   losartan-hydrochlorothiazide 100-12.5 MG tablet Commonly  known as:  HYZAAR TAKE 1 TABLET DAILY   Magnesium 250 MG Tabs Take 250 mg by mouth daily.   metFORMIN 500 MG tablet Commonly known as:  GLUCOPHAGE TAKE ONE TABLET TWICE A DAY WITH FOOD   methimazole 5 MG tablet Commonly known as:  TAPAZOLE TAKE 3 TABLETS DAILY   metoprolol succinate 100 MG 24 hr tablet Commonly known as:  TOPROL-XL TAKE ONE (1) TABLET EACH DAY   potassium chloride 10 MEQ tablet Commonly known as:  K-DUR TAKE ONE (1) TABLET EACH DAY   promethazine 25 MG tablet Commonly known as:  PHENERGAN Take 1 tablet (25 mg total) by mouth every 6 (six) hours as needed for nausea or vomiting.   RABEprazole 20 MG tablet Commonly known as:  ACIPHEX TAKE ONE (1) TABLET EACH DAY   valACYclovir 1000 MG tablet Commonly known as:  VALTREX TAKE 2 TABLETS EVERY 12 HOURS FOR 1 DAY,AND THEN AS NEEDED   VESICARE 5 MG tablet Generic drug:  solifenacin TAKE 1 TABLET DAILY   VITAMIN B12 PO Take 1 mL by mouth daily. OTC           Past Medical History:  Diagnosis Date  . Allergy   . Anxiety   . Arthritis   . Asthma    patient denies   . Depression   . Diabetes mellitus    Type two  . Diverticulitis   . Family history of adverse reaction to anesthesia    daughter- N/V   . Frequency of urination   . GERD (gastroesophageal reflux disease)   . History of hiatal hernia   . Hyperglycemia   . Hyperlipidemia   . Hypertension   . Hyperthyroidism    PT HAD BIPOSY -NEGATIVE.Marland Kitchen AND NOW JUST GETS CHECKED EACH YEAR .Marland Kitchen 2013 LAST TEST   SEES DR. [name].   . 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 Obstructive sleep apnea    STUDY AT [facility] DOES NOT USE C PAP  . Palpitations   . Peptic ulcer disease   . Pneumonia    "walking pneumonia" hx of  . Seizures (HCC)    one siezure with brain surgery none since 2015     Past Surgical History:  Procedure Laterality Date  . ABDOMINAL HYSTERECTOMY    . BACK  SURGERY     L SPIN 2003   NECK 2004  . BRAIN MENINGIOMA EXCISION   8/10   X 2  . C5-C6 neck fusion    . COLONOSCOPY    . CRANIOTOMY  08/13/2011   Procedure: CRANIOTOMY TUMOR EXCISION;  Surgeon: Cristi Loron, MD;  Location: [facility];  Service: Neurosurgery;  Laterality: Bilateral;  Bifrontal craniotomy for tumor  . DILATION AND CURETTAGE OF UTERUS     YEARS AGO  . L4-L5 posterior la    . left foot plantar and hammertoe    . right foot bunectomy    . right knee arthroscopy     x 3  . right shoulder rotator cuff    . ROBOTIC ASSISTED BILATERAL SALPINGO OOPHERECTOMY N/A 08/25/2015   Procedure: XI ROBOTIC ASSISTED BILATERAL SALPINGO OOPHORECTOMY;  Surgeon: Laurette Schimke, MD;  Location: [facility];  Service: Gynecology;  Laterality: N/A;  . TONSILLECTOMY    . TUBAL LIGATION    . WRIST SURGERY Right     Family History  Problem Relation Age of Onset  . Cancer Mother        ovarian  . Heart disease Father 49  . Arthritis Other   . Hyperlipidemia Other   . Hypertension Other   . Diabetes Other   . Anesthesia problems Daughter        NAUSEA AND VOMITING POST OP  . Breast cancer Neg Hx     Social History:  reports that she has never smoked. She has never used smokeless tobacco. She reports that she does not drink alcohol or use drugs.  Allergies:  Allergies  Allergen Reactions  . Flagyl [Metronidazole] Other (See Comments)    Caused increased heart rate  . Aspirin Effervescent Swelling    Alka-Seltzer gel caps- sore mouth, face swollen, turned hands white  . Crestor [Rosuvastatin]   . Morphine Sulfate Hives    Hallucinations  . Penicillins Swelling and Rash    Has patient had a PCN reaction causing immediate rash, facial/tongue/throat swelling, SOB or lightheadedness with hypotension: Yes Has patient had a PCN reaction causing severe rash involving mucus membranes or skin necrosis: No Has patient had a PCN reaction that required hospitalization No Has patient had a PCN reaction occurring within the last 10 years: No If all of the above  answers are "NO", then may proceed with Cephalosporin use.       Review of Systems    DIABETES: She has brought her monitor for download She is checking readings mostly in the morningWith only a couple of readings after eating Previously blood sugars were sometimes in the 70s fasting and now ranging from 97-1 46 and after meals 144, 143  Has not had recent A1c from PCP   Examination:   BP 110/74   Pulse 70   Ht 5\' 4"  (1.626 m)   Wt 208 lb 3.2 oz (94.4 kg)   SpO2 98%   BMI 35.74 kg/m   Mild facial puffiness present   Eyes: Mild prominence of the Eyes, Has mild deviation of the left eye medially and she gets diplopia when looking to the left  Thyroid not clearly palpable   Deep tendon reflexes at biceps are showing some slow relaxation    Assessment/Plan:  Graves' disease treated with area radioactive iodine and about 2 months ago  Appears to be having symptoms and signs of hypothyroidism now as above  DIPLOPIA: Although most likely this is related to Graves' disease or ophthalmologist thinks  this is from diabetes which is otherwise mild  Muscle cramps: Not care of this is related to like to like disturbance or hypothyroidism  Recommendations:  Check thyroid levels again today She will continue metformin at breakfast and suppertime Check A1c as she does not have follow-up with PCP scheduled  There are no Patient Instructions on file for this visit.  Omaree Fuqua 08/08/2016, 10:58 AM

## 2016-08-08 NOTE — Telephone Encounter (Signed)
Patient stated that she forgot to tell you this. Please advise.

## 2016-08-08 NOTE — Progress Notes (Signed)
Please call to let patient know that the thyroid results are showing hypothyroidism and needs to start taking levothyroxine 125 g daily Potassium is okay but her kidney test is slightly worse, need to reduce losartan HCT to half tablet for now and follow-up with PCP A1c fairly good at 6.6

## 2016-08-09 NOTE — Telephone Encounter (Signed)
Patient has question about he lab test, concerning her kidneys. Please advise

## 2016-08-09 NOTE — Telephone Encounter (Signed)
Patient called to advise that she was returning a phone call about her kidneys. Please call patient to advise on her cell due to traveling today. Okay to leave a detailed message on phone.

## 2016-08-10 NOTE — Telephone Encounter (Signed)
Called patient and left a voice message to call us back regarding lab result questions.

## 2016-08-13 ENCOUNTER — Ambulatory Visit (INDEPENDENT_AMBULATORY_CARE_PROVIDER_SITE_OTHER): Payer: Medicare Other | Admitting: Family Medicine

## 2016-08-13 ENCOUNTER — Encounter: Payer: Self-pay | Admitting: Family Medicine

## 2016-08-13 VITALS — BP 130/80 | HR 84 | Temp 98.3°F | Wt 217.9 lb

## 2016-08-13 DIAGNOSIS — R252 Cramp and spasm: Secondary | ICD-10-CM | POA: Diagnosis not present

## 2016-08-13 DIAGNOSIS — R7989 Other specified abnormal findings of blood chemistry: Secondary | ICD-10-CM | POA: Diagnosis not present

## 2016-08-13 NOTE — Progress Notes (Signed)
Subjective:     Patient ID: Dawn Salas, female   DOB: 03-18-1940, 76 y.o.   MRN: [id number]  HPI Patient seen for the following issues  Recently was at her endocrinologist and had creatinine of 1.36 which is up from her baseline around 1. She feels she is hydrating fairly well. She was taking losartan HCTZ 100/12.5 and was advised to reduce this to half tablet daily about 5 or 6 days ago. She's also had frequent leg cramps recently involving her calf muscles and muscles in her feet and her potassium was stable.  She has history of hyperthyroidism and recent radioactive ablation and is now hypothyroid and started replacement therapy. She said some peripheral edema. No dyspnea. No orthopnea. Recent hemoglobin A1c 6.6%  Past Medical History:  Diagnosis Date  . Allergy   . Anxiety   . Arthritis   . Asthma    patient denies   . Depression   . Diabetes mellitus    Type two  . Diverticulitis   . Family history of adverse reaction to anesthesia    daughter- N/V   . Frequency of urination   . GERD (gastroesophageal reflux disease)   . History of hiatal hernia   . Hyperglycemia   . Hyperlipidemia   . Hypertension   . Hyperthyroidism    PT HAD BIPOSY -NEGATIVE.Marland Kitchen AND NOW JUST GETS CHECKED EACH YEAR .Marland Kitchen 2013 LAST TEST   SEES DR. [name].   . Multiple meningiomas of spine and brain ([place])   . Obstructive sleep apnea    [facility]UDY AT [facility] DOES NOT USE C PAP  . Palpitations   . Peptic ulcer disease   . Pneumonia    "walking pneumonia" hx of  . Seizures ([place])    one siezure with brain surgery none since 2015    Past Surgical History:  Procedure Laterality Date  . ABDOMINAL HYSTERECTOMY    . BACK SURGERY     L SPIN 2003   NECK 2004  . BRAIN MENINGIOMA EXCISION  8/10   X 2  . C5-C6 neck fusion    . COLONOSCOPY    . CRANIOTOMY  08/13/2011   Procedure: CRANIOTOMY TUMOR EXCISION;  Surgeon: [name], MD;  Location: [facility];  Service: Neurosurgery;  Laterality: Bilateral;   Bifrontal craniotomy for tumor  . DILATION AND CURETTAGE OF UTERUS     YEARS AGO  . L4-L5 posterior la    . left foot plantar and hammertoe    . right foot bunectomy    . right knee arthroscopy     x 3  . right shoulder rotator cuff    . ROBOTIC ASSISTED BILATERAL SALPINGO OOPHERECTOMY N/A 08/25/2015   Procedure: XI ROBOTIC ASSISTED BILATERAL SALPINGO OOPHORECTOMY;  Surgeon: [name], MD;  Location: [facility];  Service: Gynecology;  Laterality: N/A;  . TONSILLECTOMY    . TUBAL LIGATION    . WRIST SURGERY Right     reports that she has never smoked. She has never used smokeless tobacco. She reports that she does not drink alcohol or use drugs. family history includes Anesthesia problems in her daughter; Arthritis in her other; Cancer in her mother; Diabetes in her other; Heart disease (age of onset: 62) in her father; Hyperlipidemia in her other; Hypertension in her other. Allergies  Allergen Reactions  . Flagyl [Metronidazole] Other (See Comments)    Caused increased heart rate  . Aspirin Effervescent Swelling    Alka-Seltzer gel caps- sore mouth, face swollen, turned  hands white  . Crestor [Rosuvastatin]   . Morphine Sulfate Hives    Hallucinations  . Penicillins Swelling and Rash    Has patient had a PCN reaction causing immediate rash, facial/tongue/throat swelling, SOB or lightheadedness with hypotension: Yes Has patient had a PCN reaction causing severe rash involving mucus membranes or skin necrosis: No Has patient had a PCN reaction that required hospitalization No Has patient had a PCN reaction occurring within the last 10 years: No If all of the above answers are "NO", then may proceed with Cephalosporin use.      Review of Systems  Constitutional: Positive for fatigue.  Eyes: Negative for visual disturbance.  Respiratory: Negative for cough, chest tightness, shortness of breath and wheezing.   Cardiovascular: Positive for leg swelling. Negative for chest pain and  palpitations.  Genitourinary: Negative for dysuria.  Neurological: Negative for dizziness, seizures, syncope, weakness, light-headedness and headaches.       Objective:   Physical Exam  Constitutional: She appears well-developed and well-nourished.  Cardiovascular: Normal rate and regular rhythm.   Pulmonary/Chest: Effort normal and breath sounds normal. No respiratory distress. She has no wheezes. She has no rales.  Musculoskeletal:  No pitting edema       Assessment:     #1 recent bump in creatinine  #2 frequent leg cramps    Plan:     -Repeat basic metabolic panel -Add magnesium level -Continue close follow-up with endocrinology regarding her thyroid medication  Eulas Post MD [facility] at [facility]

## 2016-08-13 NOTE — Telephone Encounter (Signed)
I contacted the patient and advised of MD response stated below about the kidney function. Kidney test is slightly worse, need to reduce losartan HCT to half tablet for now and follow-up with PCP

## 2016-08-13 NOTE — Telephone Encounter (Signed)
Patient returning phone call, call patient to advise. Okay to leave a detailed message on phone. Patient will be home until 3p today.

## 2016-08-13 NOTE — Telephone Encounter (Signed)
Routing to you °

## 2016-08-14 ENCOUNTER — Other Ambulatory Visit: Payer: Self-pay | Admitting: Family Medicine

## 2016-08-14 DIAGNOSIS — R944 Abnormal results of kidney function studies: Secondary | ICD-10-CM

## 2016-08-14 LAB — BASIC METABOLIC PANEL
BUN: 14 mg/dL (ref 6–23)
CO2: 29 mEq/L (ref 19–32)
Calcium: 8.8 mg/dL (ref 8.4–10.5)
Chloride: 103 mEq/L (ref 96–112)
Creatinine, Ser: 1.24 mg/dL — ABNORMAL HIGH (ref 0.40–1.20)
GFR: 54.42 mL/min — ABNORMAL LOW (ref >60.00)
Glucose, Bld: 130 mg/dL — ABNORMAL HIGH (ref 70–99)
Potassium: 4.1 mEq/L (ref 3.5–5.1)
Sodium: 140 mEq/L (ref 135–145)

## 2016-08-14 LAB — MAGNESIUM: Magnesium: 1.9 mg/dL (ref 1.5–2.5)

## 2016-08-28 ENCOUNTER — Other Ambulatory Visit (INDEPENDENT_AMBULATORY_CARE_PROVIDER_SITE_OTHER): Payer: Medicare Other

## 2016-08-28 DIAGNOSIS — R944 Abnormal results of kidney function studies: Secondary | ICD-10-CM | POA: Diagnosis not present

## 2016-08-28 LAB — BASIC METABOLIC PANEL
BUN: 18 mg/dL (ref 6–23)
CO2: 30 mEq/L (ref 19–32)
Calcium: 9.1 mg/dL (ref 8.4–10.5)
Chloride: 101 mEq/L (ref 96–112)
Creatinine, Ser: 1.24 mg/dL — ABNORMAL HIGH (ref 0.40–1.20)
GFR: 54.42 mL/min — ABNORMAL LOW (ref >60.00)
Glucose, Bld: 66 mg/dL — ABNORMAL LOW (ref 70–99)
Potassium: 4.4 mEq/L (ref 3.5–5.1)
Sodium: 140 mEq/L (ref 135–145)

## 2016-09-03 LAB — HM DIABETES EYE EXAM

## 2016-09-06 ENCOUNTER — Other Ambulatory Visit: Payer: Self-pay | Admitting: Surgery

## 2016-09-06 DIAGNOSIS — H532 Diplopia: Secondary | ICD-10-CM

## 2016-09-06 DIAGNOSIS — H4911 Fourth [trochlear] nerve palsy, right eye: Secondary | ICD-10-CM

## 2016-09-13 ENCOUNTER — Encounter: Payer: Self-pay | Admitting: Endocrinology

## 2016-09-20 ENCOUNTER — Ambulatory Visit
Admission: RE | Admit: 2016-09-20 | Discharge: 2016-09-20 | Disposition: A | Discharge status: Home / Self Care | Payer: Medicare Other | Source: Ambulatory Visit | Attending: Surgery | Admitting: Surgery

## 2016-09-20 DIAGNOSIS — H532 Diplopia: Secondary | ICD-10-CM

## 2016-09-20 DIAGNOSIS — H4911 Fourth [trochlear] nerve palsy, right eye: Secondary | ICD-10-CM

## 2016-09-20 MED ORDER — GADOBENATE DIMEGLUMINE 529 MG/ML IV SOLN
20.0000 mL | Freq: Once | INTRAVENOUS | Status: AC | PRN
  Administered 2016-09-20: 20 mL via INTRAVENOUS

## 2016-09-25 ENCOUNTER — Other Ambulatory Visit: Payer: Self-pay | Admitting: Family Medicine

## 2016-09-25 LAB — HM DIABETES EYE EXAM

## 2016-10-02 ENCOUNTER — Ambulatory Visit (INDEPENDENT_AMBULATORY_CARE_PROVIDER_SITE_OTHER): Payer: Medicare Other | Admitting: Family Medicine

## 2016-10-02 ENCOUNTER — Encounter: Payer: Self-pay | Admitting: Endocrinology

## 2016-10-02 ENCOUNTER — Encounter: Payer: Self-pay | Admitting: Family Medicine

## 2016-10-02 VITALS — BP 118/70 | HR 68 | Temp 97.9°F | Wt 212.0 lb

## 2016-10-02 DIAGNOSIS — N289 Disorder of kidney and ureter, unspecified: Secondary | ICD-10-CM | POA: Diagnosis not present

## 2016-10-02 LAB — BASIC METABOLIC PANEL
BUN: 18 mg/dL (ref 6–23)
CO2: 28 mEq/L (ref 19–32)
Calcium: 9.2 mg/dL (ref 8.4–10.5)
Chloride: 102 mEq/L (ref 96–112)
Creatinine, Ser: 1.24 mg/dL — ABNORMAL HIGH (ref 0.40–1.20)
GFR: 54.4 mL/min — ABNORMAL LOW (ref >60.00)
Glucose, Bld: 126 mg/dL — ABNORMAL HIGH (ref 70–99)
Potassium: 4.1 mEq/L (ref 3.5–5.1)
Sodium: 141 mEq/L (ref 135–145)

## 2016-10-02 NOTE — Progress Notes (Signed)
Subjective:     Patient ID: Dawn Salas, female   DOB: Nov 05, 1940, 76 y.o.   MRN: [id number]  HPI Patient seen to reassess her acute renal insufficiency. She has chronic problems of obesity, type 2 diabetes, hyperthyroidism, hyperlipidemia, mixed urinary incontinence, benign meningiomas of the brain, obstructive sleep apnea. She has ongoing low back pain and takes occasional Aleve usually about twice per week.  In looking back over her labs she had creatinine of 0.93 approximately one year ago. On March 12 of this year creatinine 1.06. 7/18 creatinine 1.36 and endocrinologist reduced her losartan HCTZ to one half tablet daily. On follow-up labs on 7/23 and 8/7 she had creatinine 1.24 with normal BUN. She denies any obstructive urinary symptoms. Is staying well-hydrated. No new medications. Blood pressures been very well controlled. She has type 2 diabetes and this also is been very well controlled. No increased edema.  Lab Results  Component Value Date   CREATININE 1.24 (H) 08/28/2016     Past Medical History:  Diagnosis Date  . Allergy   . Anxiety   . Arthritis   . Asthma    patient denies   . Depression   . Diabetes mellitus    Type two  . Diverticulitis   . Family history of adverse reaction to anesthesia    daughter- N/V   . Frequency of urination   . GERD (gastroesophageal reflux disease)   . History of hiatal hernia   . Hyperglycemia   . Hyperlipidemia   . Hypertension   . Hyperthyroidism    PT HAD BIPOSY -NEGATIVE.Marland Kitchen AND NOW JUST GETS CHECKED EACH YEAR .Marland Kitchen 2013 LAST TEST   SEES DR. [name].   . Multiple meningiomas of spine and brain ([place])   . Obstructive sleep apnea    STUDY AT [facility] DOES NOT USE C PAP  . Palpitations   . Peptic ulcer disease   . Pneumonia    "walking pneumonia" hx of  . Seizures ([place])    one siezure with brain surgery none since 2015    Past Surgical History:  Procedure Laterality Date  . ABDOMINAL HYSTERECTOMY    . BACK SURGERY     L SPIN  2003   NECK 2004  . BRAIN MENINGIOMA EXCISION  8/10   X 2  . C5-C6 neck fusion    . COLONOSCOPY    . CRANIOTOMY  08/13/2011   Procedure: CRANIOTOMY TUMOR EXCISION;  Surgeon: [name], MD;  Location: [facility];  Service: Neurosurgery;  Laterality: Bilateral;  Bifrontal craniotomy for tumor  . DILATION AND CURETTAGE OF UTERUS     YEARS AGO  . L4-L5 posterior la    . left foot plantar and hammertoe    . right foot bunectomy    . right knee arthroscopy     x 3  . right shoulder rotator cuff    . ROBOTIC ASSISTED BILATERAL SALPINGO OOPHERECTOMY N/A 08/25/2015   Procedure: XI ROBOTIC ASSISTED BILATERAL SALPINGO OOPHORECTOMY;  Surgeon: [name], MD;  Location: [facility];  Service: Gynecology;  Laterality: N/A;  . TONSILLECTOMY    . TUBAL LIGATION    . WRIST SURGERY Right     reports that she has never smoked. She has never used smokeless tobacco. She reports that she does not drink alcohol or use drugs. family history includes Anesthesia problems in her daughter; Arthritis in her other; Cancer in her mother; Diabetes in her other; Heart disease (age of onset: 51) in her father; Hyperlipidemia  in her other; Hypertension in her other. Allergies  Allergen Reactions  . Flagyl [Metronidazole] Other (See Comments)    Caused increased heart rate  . Aspirin Effervescent Swelling    Alka-Seltzer gel caps- sore mouth, face swollen, turned hands white  . Crestor [Rosuvastatin]   . Morphine Sulfate Hives    Hallucinations  . Penicillins Swelling and Rash    Has patient had a PCN reaction causing immediate rash, facial/tongue/throat swelling, SOB or lightheadedness with hypotension: Yes Has patient had a PCN reaction causing severe rash involving mucus membranes or skin necrosis: No Has patient had a PCN reaction that required hospitalization No Has patient had a PCN reaction occurring within the last 10 years: No If all of the above answers are "NO", then may proceed with Cephalosporin  use.      Review of Systems  Constitutional: Negative for fatigue.  Eyes: Negative for visual disturbance.  Respiratory: Negative for cough, chest tightness, shortness of breath and wheezing.   Cardiovascular: Negative for chest pain, palpitations and leg swelling.  Neurological: Negative for dizziness, seizures, syncope, weakness, light-headedness and headaches.       Objective:   Physical Exam  Constitutional: She appears well-developed and well-nourished.  Eyes: Pupils are equal, round, and reactive to light.  Neck: Neck supple. No JVD present. No thyromegaly present.  Cardiovascular: Normal rate and regular rhythm.  Exam reveals no gallop.   Pulmonary/Chest: Effort normal and breath sounds normal. No respiratory distress. She has no wheezes. She has no rales.  Abdominal:  No bruits  Musculoskeletal: She exhibits no edema.  Neurological: She is alert.       Assessment:     Acute renal insufficiency. She had baseline creatinine around 0.9 with recent bump to 1.36 with minimal improvement with reduction in losartan HCTZ dose.  Doubt prerenal or postrenal etiology    Plan:     -Avoid Aleve or any other nonsteroidals for now -Recheck basic metabolic panel -Consider nephrology referral if elevating -She'll continue close follow-up with endocrinology regarding her hyperthyroidism  Eulas Post MD [facility] at [facility]

## 2016-10-02 NOTE — Patient Instructions (Signed)
Leave off Aleve or other NSAIDS  We will call you with lab results

## 2016-10-08 ENCOUNTER — Other Ambulatory Visit: Payer: Self-pay | Admitting: Family Medicine

## 2016-10-09 ENCOUNTER — Encounter: Payer: Self-pay | Admitting: Endocrinology

## 2016-10-09 ENCOUNTER — Ambulatory Visit (INDEPENDENT_AMBULATORY_CARE_PROVIDER_SITE_OTHER): Payer: Medicare Other | Admitting: Endocrinology

## 2016-10-09 VITALS — BP 130/82 | HR 72 | Ht 64.0 in | Wt 212.8 lb

## 2016-10-09 DIAGNOSIS — E0590 Thyrotoxicosis, unspecified without thyrotoxic crisis or storm: Secondary | ICD-10-CM | POA: Diagnosis not present

## 2016-10-09 DIAGNOSIS — Z923 Personal history of irradiation: Secondary | ICD-10-CM

## 2016-10-09 LAB — T4, FREE: Free T4: 1.53 ng/dL (ref 0.60–1.60)

## 2016-10-09 LAB — TSH: TSH: 0.01 u[IU]/mL — ABNORMAL LOW (ref 0.35–4.50)

## 2016-10-09 NOTE — Progress Notes (Signed)
1 Patient ID: Dawn Salas, female   DOB: 08-07-1940, 76 y.o.   MRN: [id number]                                                                                                                  Chief complaint: thyroid follow-up   History of Present Illness:   History obtained on initial consultation:  She was diagnosed as having Hyperthyroidism in 2015 when she was having palpitations and was seen to have a goiter. At that time she was having the usual symptoms of fatigue, shakiness, palpitations, heat intolerance and some weight loss No details of this are available She was treated with I-131, unknown dose which was prescribed in March 2015 Apparently she had a large hot nodule on the left, however details of her treatment are not available  Apparently subsequently her hyperthyroidism recurred and methimazole restarted ?  In 4/17 Also the dose was increased up to 7.5 mg in 10/2015  Apparently prior to her being treated for the hot nodule she had a multinodular goiter evaluated with a fine-needle aspiration in 09/2010  RECENT history: Since she was significantly hyperthyroid on her initial exam in February and free T3 was 5.0 and was treated with methimazole  Because of persistent hyperthyroidism and very high thyrotropin receptor antibody she was given I-131 treatment with 20.3 mCi on 06/04/16  She has become HYPOTHYROID as of 7/18 with low free T4 level She was also having some fatigue and cold sensitivity as well as the muscle cramps  With starting levothyroxine she is not is consistently tired or cold Weight has come down  She is still diplopia when looking to her left and this has been compensated with the prism lens She does have significant changes in her left orbital muscles on MRI  She was told by the ophthalmologist to be tested for thyroid antibodies  Previous weight range 199-211  Wt Readings from Last 3 Encounters:  10/09/16 212 lb 12.8 oz (96.5 kg)  10/02/16  212 lb (96.2 kg)  08/13/16 217 lb 14.4 oz (98.8 kg)      Thyroid function tests as follows:     Lab Results  Component Value Date   FREET4 0.44 (L) 08/08/2016   FREET4 0.71 07/06/2016   FREET4 0.81 05/23/2016   T3FREE 3.1 07/06/2016   T3FREE 3.6 05/23/2016   T3FREE 5.0 (H) 03/12/2016   TSH 20.25 (H) 08/08/2016   TSH <0.01 (L) 07/06/2016   TSH 0.02 (L) 03/12/2016    Lab Results  Component Value Date   THYROTRECAB 87.70 (H) 04/11/2016        Allergies as of 10/09/2016      Reactions   Flagyl [metronidazole] Other (See Comments)   Caused increased heart rate   Aspirin Effervescent Swelling   Alka-Seltzer gel caps- sore mouth, face swollen, turned hands white   Crestor [rosuvastatin]    Morphine Sulfate Hives   Hallucinations   Penicillins Swelling, Rash   Has patient had a PCN reaction causing immediate rash,  facial/tongue/throat swelling, SOB or lightheadedness with hypotension: Yes Has patient had a PCN reaction causing severe rash involving mucus membranes or skin necrosis: No Has patient had a PCN reaction that required hospitalization No Has patient had a PCN reaction occurring within the last 10 years: No If all of the above answers are "NO", then may proceed with Cephalosporin use.      Medication List       Accurate as of 10/09/16 11:15 AM. Always use your most recent med list.          ACCU-CHEK AVIVA PLUS test strip Generic drug:  glucose blood TEST BLOOD SUGAR TWICE DAILY.   accu-chek softclix lancets 1 each by Other route 2 (two) times daily. E11.9   amLODipine 2.5 MG tablet Commonly known as:  NORVASC TAKE ONE (1) TABLET EACH DAY   cetirizine 10 MG tablet Commonly known as:  ZYRTEC Take 10 mg by mouth daily as needed. For allergies   furosemide 40 MG tablet Commonly known as:  LASIX Take 0.5 tablets (20 mg total) by mouth daily as needed. For fluid   GERITOL COMPLETE PO Take 1 tablet by mouth daily.   levothyroxine 125 MCG  tablet Commonly known as:  SYNTHROID, LEVOTHROID Take 1 tablet (125 mcg total) by mouth daily.   losartan-hydrochlorothiazide 100-12.5 MG tablet Commonly known as:  HYZAAR TAKE 1 TABLET DAILY   Magnesium 250 MG Tabs Take 250 mg by mouth daily.   metFORMIN 500 MG tablet Commonly known as:  GLUCOPHAGE TAKE ONE TABLET TWICE A DAY WITH FOOD   metoprolol succinate 100 MG 24 hr tablet Commonly known as:  TOPROL-XL TAKE ONE (1) TABLET EACH DAY   potassium chloride 10 MEQ tablet Commonly known as:  K-DUR TAKE ONE (1) TABLET EACH DAY   promethazine 25 MG tablet Commonly known as:  PHENERGAN Take 1 tablet (25 mg total) by mouth every 6 (six) hours as needed for nausea or vomiting.   RABEprazole 20 MG tablet Commonly known as:  ACIPHEX TAKE ONE (1) TABLET EACH DAY   valACYclovir 1000 MG tablet Commonly known as:  VALTREX TAKE 2 TABLETS EVERY 12 HOURS FOR 1 DAY,AND THEN AS NEEDED   VESICARE 5 MG tablet Generic drug:  solifenacin TAKE 1 TABLET DAILY   VITAMIN B12 PO Take 1 mL by mouth daily. OTC           Past Medical History:  Diagnosis Date  . Allergy   . Anxiety   . Arthritis   . Asthma    patient denies   . Depression   . Diabetes mellitus    Type two  . Diverticulitis   . Family history of adverse reaction to anesthesia    daughter- N/V   . Frequency of urination   . GERD (gastroesophageal reflux disease)   . History of hiatal hernia   . Hyperglycemia   . Hyperlipidemia   . Hypertension   . Hyperthyroidism    PT HAD BIPOSY -NEGATIVE.Marland Kitchen AND NOW JUST GETS CHECKED EACH YEAR .Marland Kitchen 2013 LAST TEST   SEES DR. [name].   . 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 Obstructive sleep apnea    STUDY AT [facility] DOES NOT USE C PAP  . Palpitations   . Peptic ulcer disease   . Pneumonia    "walking pneumonia" hx of  . Seizures ([facility])    one siezure with brain surgery none since 2015     Past Surgical History:  Procedure Laterality  Date  . ABDOMINAL  HYSTERECTOMY    . BACK SURGERY     L SPIN 2003   NECK 2004  . BRAIN MENINGIOMA EXCISION  8/10   X 2  . C5-C6 neck fusion    . COLONOSCOPY    . CRANIOTOMY  08/13/2011   Procedure: CRANIOTOMY TUMOR EXCISION;  Surgeon: Cristi Loron, MD;  Location: [facility];  Service: Neurosurgery;  Laterality: Bilateral;  Bifrontal craniotomy for tumor  . DILATION AND CURETTAGE OF UTERUS     YEARS AGO  . L4-L5 posterior la    . left foot plantar and hammertoe    . right foot bunectomy    . right knee arthroscopy     x 3  . right shoulder rotator cuff    . ROBOTIC ASSISTED BILATERAL SALPINGO OOPHERECTOMY N/A 08/25/2015   Procedure: XI ROBOTIC ASSISTED BILATERAL SALPINGO OOPHORECTOMY;  Surgeon: Laurette Schimke, MD;  Location: [facility];  Service: Gynecology;  Laterality: N/A;  . TONSILLECTOMY    . TUBAL LIGATION    . WRIST SURGERY Right     Family History  Problem Relation Age of Onset  . Cancer Mother        ovarian  . Heart disease Father 56  . Arthritis Other   . Hyperlipidemia Other   . Hypertension Other   . Diabetes Other   . Anesthesia problems Daughter        NAUSEA AND VOMITING POST OP  . Breast cancer Neg Hx     Social History:  reports that she has never smoked. She has never used smokeless tobacco. She reports that she does not drink alcohol or use drugs.  Allergies:  Allergies  Allergen Reactions  . Flagyl [Metronidazole] Other (See Comments)    Caused increased heart rate  . Aspirin Effervescent Swelling    Alka-Seltzer gel caps- sore mouth, face swollen, turned hands white  . Crestor [Rosuvastatin]   . Morphine Sulfate Hives    Hallucinations  . Penicillins Swelling and Rash    Has patient had a PCN reaction causing immediate rash, facial/tongue/throat swelling, SOB or lightheadedness with hypotension: Yes Has patient had a PCN reaction causing severe rash involving mucus membranes or skin necrosis: No Has patient had a PCN reaction that required hospitalization  No Has patient had a PCN reaction occurring within the last 10 years: No If all of the above answers are "NO", then may proceed with Cephalosporin use.       Review of Systems    DIABETES: She has brought her monitor for download She is Having fairly good readings with taking metformin   Lab Results  Component Value Date   HGBA1C 6.6 (H) 08/08/2016   HGBA1C 6.6 (H) 04/02/2016   HGBA1C 6.3 11/25/2015   Lab Results  Component Value Date   LDLCALC 155 (H) 04/02/2016   CREATININE 1.24 (H) 10/02/2016      Examination:   BP 130/82   Pulse 72   Ht 5\' 4"  (1.626 m)   Wt 212 lb 12.8 oz (96.5 kg)   SpO2 98%   BMI 36.53 kg/m     Eyes: Mild prominence of the Eyes, Has mild deviation of the left eye and she gets diplopia when looking straight    Deep tendon reflexes at biceps are normal    Assessment/Plan:  Graves' disease treated with radioactive iodine and has had subsequent HYPOTHYROIDISM about 2 months ago  Appears to be feeling somewhat better and looks euthyroid with thyroxine supplementation  DIPLOPIA:  most likely this is related to Graves' disease with typical findings on MRI of the orbital muscles also   Recommendations:  Check thyroid levels again today and decide on dosage  She does not need any further testing for thyroid antibodies since she does have confirmed Graves' disease  Her ophthalmopathy may have been worsened following release of thyroid antigen using I-131 treatment Currently is compensated with her prism lens and does not need steroids which would cause significant side effects  She will continue to follow-up with PCP for diabetes  There are no Patient Instructions on file for this visit.  Mort Smelser 10/09/2016, 11:15 AM

## 2016-10-11 ENCOUNTER — Telehealth: Payer: Self-pay | Admitting: Endocrinology

## 2016-10-11 ENCOUNTER — Other Ambulatory Visit: Payer: Self-pay

## 2016-10-11 MED ORDER — LEVOTHYROXINE SODIUM 100 MCG PO TABS: 100.0000 ug | ORAL_TABLET | Freq: Every day | ORAL | 3 refills | Status: DC

## 2016-10-11 NOTE — Telephone Encounter (Signed)
Routing to you °

## 2016-10-11 NOTE — Telephone Encounter (Signed)
This has been switched to 100 mcg which was already called into the pharmacy patient was notified

## 2016-10-11 NOTE — Telephone Encounter (Signed)
MEDICATION: levothyroxine (SYNTHROID, LEVOTHROID) 125 MCG tablet  PHARMACY:   [facility] [place] [facility] [place] (Phone) [phone] (Fax)     IS THIS A 90 DAY SUPPLY : N/A   IS PATIENT OUT OF MEDICATION: N  IF NOT; HOW MUCH IS LEFT: N/A  LAST APPOINTMENT DATE: 10/09/16  NEXT APPOINTMENT DATE: 11/21/16  OTHER COMMENTS: Patient stated the MCG was supposed to be decreased. Patient stated pharmacy has not received Rx and patient was told it would be sent yesterday 10/10/16   **Let patient know to contact pharmacy at the end of the day to make sure medication is ready. **  ** Please notify patient to allow 48-72 hours to process**  **Encourage patient to contact the pharmacy for refills or they can request refills through [facility]**

## 2016-10-30 ENCOUNTER — Other Ambulatory Visit: Payer: Self-pay | Admitting: Family Medicine

## 2016-11-09 LAB — BASIC METABOLIC PANEL: Glucose: 105

## 2016-11-14 ENCOUNTER — Other Ambulatory Visit: Payer: Self-pay | Admitting: Family Medicine

## 2016-11-21 ENCOUNTER — Encounter: Payer: Self-pay | Admitting: Endocrinology

## 2016-11-21 ENCOUNTER — Ambulatory Visit (INDEPENDENT_AMBULATORY_CARE_PROVIDER_SITE_OTHER): Payer: Medicare Other | Admitting: Endocrinology

## 2016-11-21 VITALS — BP 122/80 | HR 67 | Ht 64.0 in | Wt 215.6 lb

## 2016-11-21 DIAGNOSIS — E890 Postprocedural hypothyroidism: Secondary | ICD-10-CM | POA: Diagnosis not present

## 2016-11-21 DIAGNOSIS — E0500 Thyrotoxicosis with diffuse goiter without thyrotoxic crisis or storm: Secondary | ICD-10-CM | POA: Diagnosis not present

## 2016-11-21 LAB — T4, FREE: Free T4: 1.11 ng/dL (ref 0.60–1.60)

## 2016-11-21 LAB — TSH: TSH: 0.12 u[IU]/mL — ABNORMAL LOW (ref 0.35–4.50)

## 2016-11-21 NOTE — Progress Notes (Signed)
1 Patient ID: Dawn Salas, female   DOB: Mar 10, 1940, 76 y.o.   MRN: [id number]                                                                                                                  Chief complaint: thyroid follow-up   History of Present Illness:   History obtained on initial consultation:  She was diagnosed as having Hyperthyroidism in 2015 when she was having palpitations and was seen to have a goiter. At that time she was having the usual symptoms of fatigue, shakiness, palpitations, heat intolerance and some weight loss No details of this are available She was treated with I-131, unknown dose which was prescribed in March 2015 Apparently she had a large hot nodule on the left, however details of her treatment are not available  Apparently subsequently her hyperthyroidism recurred and methimazole restarted ?  In 4/17 Also the dose was increased up to 7.5 mg in 10/2015  Apparently prior to her being treated for the hot nodule she had a multinodular goiter evaluated with a fine-needle aspiration in 09/2010  RECENT history: Since she was significantly hyperthyroid on her initial exam in February and free T3 was 5.0 and was treated with methimazole Because of persistent hyperthyroidism and very high thyrotropin receptor antibody she was given I-131 treatment with 20.3 mCi on 06/04/16  She has become HYPOTHYROID as of 7/18 with low free T4 level She was also having some fatigue and cold sensitivity as well as the muscle cramps  With starting levothyroxine 125 g she felt better, however on her last visit was TSH was low again she was reduced down to 100 g She does continue feels fairly good and improved with her energy level now However she is now complaining of feeling cold more easily Weight has fluctuated She takes her levothyroxine consistently in the mornings  She is still having diplopia when looking to her left and this has been compensated with the prism lens,  she is getting better adjusted to this now She does have significant changes in her left orbital muscles on MRI  Previous weight range 199-211  Wt Readings from Last 3 Encounters:  11/21/16 215 lb 9.6 oz (97.8 kg)  10/09/16 212 lb 12.8 oz (96.5 kg)  10/02/16 212 lb (96.2 kg)      Thyroid function tests as follows:     Lab Results  Component Value Date   FREET4 1.53 10/09/2016   FREET4 0.44 (L) 08/08/2016   FREET4 0.71 07/06/2016   T3FREE 3.1 07/06/2016   T3FREE 3.6 05/23/2016   T3FREE 5.0 (H) 03/12/2016   TSH 0.01 Repeated and verified X2. (L) 10/09/2016   TSH 20.25 (H) 08/08/2016   TSH <0.01 (L) 07/06/2016    Lab Results  Component Value Date   THYROTRECAB 87.70 (H) 04/11/2016        Allergies as of 11/21/2016      Reactions   Flagyl [metronidazole] Other (See Comments)   Caused increased heart rate   Aspirin  Effervescent Swelling   Alka-Seltzer gel caps- sore mouth, face swollen, turned hands white   Crestor [rosuvastatin]    Morphine Sulfate Hives   Hallucinations   Penicillins Swelling, Rash   Has patient had a PCN reaction causing immediate rash, facial/tongue/throat swelling, SOB or lightheadedness with hypotension: Yes Has patient had a PCN reaction causing severe rash involving mucus membranes or skin necrosis: No Has patient had a PCN reaction that required hospitalization No Has patient had a PCN reaction occurring within the last 10 years: No If all of the above answers are "NO", then may proceed with Cephalosporin use.      Medication List       Accurate as of 11/21/16 10:53 AM. Always use your most recent med list.          ACCU-CHEK AVIVA PLUS test strip Generic drug:  glucose blood TEST BLOOD SUGAR TWICE DAILY.   accu-chek softclix lancets 1 each by Other route 2 (two) times daily. E11.9   amLODipine 2.5 MG tablet Commonly known as:  NORVASC TAKE ONE (1) TABLET EACH DAY   cetirizine 10 MG tablet Commonly known as:  ZYRTEC Take  10 mg by mouth daily as needed. For allergies   furosemide 40 MG tablet Commonly known as:  LASIX Take 0.5 tablets (20 mg total) by mouth daily as needed. For fluid   GERITOL COMPLETE PO Take 1 tablet by mouth daily.   levothyroxine 100 MCG tablet Commonly known as:  SYNTHROID, LEVOTHROID Take 1 tablet (100 mcg total) by mouth daily.   losartan-hydrochlorothiazide 100-12.5 MG tablet Commonly known as:  HYZAAR TAKE 1 TABLET DAILY   Magnesium 250 MG Tabs Take 250 mg by mouth daily.   metFORMIN 500 MG tablet Commonly known as:  GLUCOPHAGE TAKE ONE TABLET TWICE A DAY WITH FOOD   metoprolol succinate 100 MG 24 hr tablet Commonly known as:  TOPROL-XL TAKE ONE (1) TABLET EACH DAY   potassium chloride 10 MEQ tablet Commonly known as:  K-DUR TAKE ONE (1) TABLET EACH DAY   promethazine 25 MG tablet Commonly known as:  PHENERGAN Take 1 tablet (25 mg total) by mouth every 6 (six) hours as needed for nausea or vomiting.   RABEprazole 20 MG tablet Commonly known as:  ACIPHEX TAKE ONE (1) TABLET EACH DAY   valACYclovir 1000 MG tablet Commonly known as:  VALTREX TAKE 2 TABLETS EVERY 12 HOURS FOR 1 DAY,AND THEN AS NEEDED   VESICARE 5 MG tablet Generic drug:  solifenacin TAKE 1 TABLET DAILY   VITAMIN B12 PO Take 1 mL by mouth daily. OTC           Past Medical History:  Diagnosis Date  . Allergy   . Anxiety   . Arthritis   . Asthma    patient denies   . Depression   . Diabetes mellitus    Type two  . Diverticulitis   . Family history of adverse reaction to anesthesia    daughter- N/V   . Frequency of urination   . GERD (gastroesophageal reflux disease)   . History of hiatal hernia   . Hyperglycemia   . Hyperlipidemia   . Hypertension   . Hyperthyroidism    PT HAD BIPOSY -NEGATIVE.Marland Kitchen AND NOW JUST GETS CHECKED EACH YEAR .Marland Kitchen 2013 LAST TEST   SEES DR. [name].   . 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 Obstructive sleep apnea    STUDY AT [facility] DOES NOT  USE C PAP  .  Palpitations   . Peptic ulcer disease   . Pneumonia    "walking pneumonia" hx of  . Seizures ([facility])    one siezure with brain surgery none since 2015     Past Surgical History:  Procedure Laterality Date  . ABDOMINAL HYSTERECTOMY    . BACK SURGERY     L SPIN 2003   NECK 2004  . BRAIN MENINGIOMA EXCISION  8/10   X 2  . C5-C6 neck fusion    . COLONOSCOPY    . CRANIOTOMY  08/13/2011   Procedure: CRANIOTOMY TUMOR EXCISION;  Surgeon: Cristi Loron, MD;  Location: [facility];  Service: Neurosurgery;  Laterality: Bilateral;  Bifrontal craniotomy for tumor  . DILATION AND CURETTAGE OF UTERUS     YEARS AGO  . L4-L5 posterior la    . left foot plantar and hammertoe    . right foot bunectomy    . right knee arthroscopy     x 3  . right shoulder rotator cuff    . ROBOTIC ASSISTED BILATERAL SALPINGO OOPHERECTOMY N/A 08/25/2015   Procedure: XI ROBOTIC ASSISTED BILATERAL SALPINGO OOPHORECTOMY;  Surgeon: Laurette Schimke, MD;  Location: [facility];  Service: Gynecology;  Laterality: N/A;  . TONSILLECTOMY    . TUBAL LIGATION    . WRIST SURGERY Right     Family History  Problem Relation Age of Onset  . Cancer Mother        ovarian  . Heart disease Father 80  . Arthritis Other   . Hyperlipidemia Other   . Hypertension Other   . Diabetes Other   . Anesthesia problems Daughter        NAUSEA AND VOMITING POST OP  . Breast cancer Neg Hx     Social History:  reports that she has never smoked. She has never used smokeless tobacco. She reports that she does not drink alcohol or use drugs.  Allergies:  Allergies  Allergen Reactions  . Flagyl [Metronidazole] Other (See Comments)    Caused increased heart rate  . Aspirin Effervescent Swelling    Alka-Seltzer gel caps- sore mouth, face swollen, turned hands white  . Crestor [Rosuvastatin]   . Morphine Sulfate Hives    Hallucinations  . Penicillins Swelling and Rash    Has patient had a PCN reaction causing immediate rash,  facial/tongue/throat swelling, SOB or lightheadedness with hypotension: Yes Has patient had a PCN reaction causing severe rash involving mucus membranes or skin necrosis: No Has patient had a PCN reaction that required hospitalization No Has patient had a PCN reaction occurring within the last 10 years: No If all of the above answers are "NO", then may proceed with Cephalosporin use.       Review of Systems    DIABETES  Followed by PCP and treated with metformin, scheduled for a visit soon   Lab Results  Component Value Date   HGBA1C 6.6 (H) 08/08/2016   HGBA1C 6.6 (H) 04/02/2016   HGBA1C 6.3 11/25/2015   Lab Results  Component Value Date   LDLCALC 155 (H) 04/02/2016   CREATININE 1.24 (H) 10/02/2016      Examination:   BP 122/80   Pulse 67   Ht 5\' 4"  (1.626 m)   Wt 215 lb 9.6 oz (97.8 kg)   SpO2 97%   BMI 37.01 kg/m    Left lobe shows a 1. 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  Unclear what her dosage requirement is since the previous regimen of 125 was excessive   Recommendations:  Check thyroid levels again today and decide on dosage Reminded her to take her medication regularly before breakfast daily  Her ophthalmopathy may have been worsened following release of thyroid antigen using I-131 treatment even though she did not have any difficulty after the first treatment Discussed that the eye disease may improve within the next year spontaneously, she is not keen on diplopia surgery  She will continue to follow-up with PCP for diabetes  There are no Patient Instructions on file  for this visit.  Daylan Boggess 11/21/2016, 10:53 AM   Addendum: TSH 0.12 with normal free T4, she will take 6-1/2 tablets per week but get labs checked with PCP in December

## 2016-12-31 ENCOUNTER — Ambulatory Visit: Payer: Self-pay | Admitting: Family Medicine

## 2017-01-02 ENCOUNTER — Encounter: Payer: Self-pay | Admitting: Family Medicine

## 2017-01-02 ENCOUNTER — Ambulatory Visit (INDEPENDENT_AMBULATORY_CARE_PROVIDER_SITE_OTHER): Payer: Medicare Other | Admitting: Family Medicine

## 2017-01-02 ENCOUNTER — Ambulatory Visit: Payer: Self-pay | Admitting: Family Medicine

## 2017-01-02 ENCOUNTER — Other Ambulatory Visit: Payer: Self-pay | Admitting: Family Medicine

## 2017-01-02 VITALS — BP 130/80 | Temp 98.4°F | Wt 216.3 lb

## 2017-01-02 DIAGNOSIS — E890 Postprocedural hypothyroidism: Secondary | ICD-10-CM

## 2017-01-02 DIAGNOSIS — I10 Essential (primary) hypertension: Secondary | ICD-10-CM

## 2017-01-02 DIAGNOSIS — N181 Chronic kidney disease, stage 1: Secondary | ICD-10-CM | POA: Diagnosis not present

## 2017-01-02 DIAGNOSIS — B001 Herpesviral vesicular dermatitis: Secondary | ICD-10-CM

## 2017-01-02 DIAGNOSIS — E1122 Type 2 diabetes mellitus with diabetic chronic kidney disease: Secondary | ICD-10-CM | POA: Diagnosis not present

## 2017-01-02 LAB — POCT GLYCOSYLATED HEMOGLOBIN (HGB A1C): Hemoglobin A1C: 6.1

## 2017-01-02 NOTE — Progress Notes (Signed)
Subjective:     Patient ID: Dawn Salas, female   DOB: 09-28-1940, 76 y.o.   MRN: [id number]  HPI Patient seen for medical follow-up  She has history of hyperthyroidism. She had radioactive ablation of thyroid recently and is now on replacement and needs follow-up labs today. Recent TSH was slightly low and endocrinologist is adjusting her medication  Type 2 diabetes. Currently takes metformin. Well controlled. A1c today 6.1%. She's not been very consistent with exercise recently  Cold sore left upper lip. Has taken Valtrex in the past and needs refills. 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  Hypertension. No recent headaches. She's had some peripheral edema controlled with furosemide.  Past Medical History:  Diagnosis Date  . Allergy   . Anxiety   . Arthritis   . Asthma    patient denies   . Depression   . Diabetes mellitus    Type two  . Diverticulitis   . Family history of adverse reaction to anesthesia    daughter- N/V   . Frequency of urination   . GERD (gastroesophageal reflux disease)   . History of hiatal hernia   . Hyperglycemia   . Hyperlipidemia   . Hypertension   . Hyperthyroidism    PT HAD BIPOSY -NEGATIVE.Marland Kitchen AND NOW JUST GETS CHECKED EACH YEAR .Marland Kitchen 2013 LAST TEST   SEES DR. [name].   . Multiple meningiomas of spine and brain ([facility])   . Obstructive sleep apnea    STUDY AT [facility] DOES NOT USE C PAP  . Palpitations   . Peptic ulcer disease   . Pneumonia    "walking pneumonia" hx of  . Seizures ([facility])    one siezure with brain surgery none since 2015    Past Surgical History:  Procedure Laterality Date  . ABDOMINAL HYSTERECTOMY    . BACK SURGERY     L SPIN 2003   NECK 2004  . BRAIN MENINGIOMA EXCISION  8/10   X 2  . C5-C6 neck fusion    . COLONOSCOPY    . CRANIOTOMY  08/13/2011   Procedure: CRANIOTOMY TUMOR EXCISION;  Surgeon: [name], MD;  Location: [facility];  Service: Neurosurgery;  Laterality: Bilateral;  Bifrontal craniotomy  for tumor  . DILATION AND CURETTAGE OF UTERUS     YEARS AGO  . L4-L5 posterior la    . left foot plantar and hammertoe    . right foot bunectomy    . right knee arthroscopy     x 3  . right shoulder rotator cuff    . ROBOTIC ASSISTED BILATERAL SALPINGO OOPHERECTOMY N/A 08/25/2015   Procedure: XI ROBOTIC ASSISTED BILATERAL SALPINGO OOPHORECTOMY;  Surgeon: [name], MD;  Location: [facility];  Service: Gynecology;  Laterality: N/A;  . TONSILLECTOMY    . TUBAL LIGATION    . WRIST SURGERY Right     reports that  has never smoked. she has never used smokeless tobacco. She reports that she does not drink alcohol or use drugs. family history includes Anesthesia problems in her daughter; Arthritis in her other; Cancer in her mother; Diabetes in her other; Heart disease (age of onset: 44) in her father; Hyperlipidemia in her other; Hypertension in her other. Allergies  Allergen Reactions  . Flagyl [Metronidazole] Other (See Comments)    Caused increased heart rate  . Aspirin Effervescent Swelling    Alka-Seltzer gel caps- sore mouth, face swollen, turned hands white  . Crestor [Rosuvastatin]   . Morphine Sulfate  Hives    Hallucinations  . Penicillins Swelling and Rash    Has patient had a PCN reaction causing immediate rash, facial/tongue/throat swelling, SOB or lightheadedness with hypotension: Yes Has patient had a PCN reaction causing severe rash involving mucus membranes or skin necrosis: No Has patient had a PCN reaction that required hospitalization No Has patient had a PCN reaction occurring within the last 10 years: No If all of the above answers are "NO", then may proceed with Cephalosporin use.      Review of Systems  Constitutional: Negative for fatigue.  Eyes: Negative for visual disturbance.  Respiratory: Negative for cough, chest tightness, shortness of breath and wheezing.   Cardiovascular: Negative for chest pain, palpitations and leg swelling.  Neurological: Negative  for dizziness, seizures, syncope, weakness, light-headedness and headaches.       Objective:   Physical Exam  Constitutional: She appears well-developed and well-nourished.  HENT:  Cold sore left upper lip.    Eyes: Pupils are equal, round, and reactive to light.  Neck: Neck supple. No JVD present. No thyromegaly present.  Cardiovascular: Normal rate and regular rhythm. Exam reveals no gallop.  Pulmonary/Chest: Effort normal and breath sounds normal. No respiratory distress. She has no wheezes. She has no rales.  Musculoskeletal: She exhibits no edema.  Neurological: She is alert.       Assessment:     #1 type 2 diabetes controlled with A1c 6.1% today.  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

## 2017-01-03 LAB — T4, FREE: Free T4: 1.03 ng/dL (ref 0.60–1.60)

## 2017-01-03 LAB — TSH: TSH: 0.2 u[IU]/mL — ABNORMAL LOW (ref 0.35–4.50)

## 2017-01-12 ENCOUNTER — Other Ambulatory Visit: Payer: Self-pay | Admitting: Family Medicine

## 2017-01-22 DIAGNOSIS — H532 Diplopia: Secondary | ICD-10-CM

## 2017-01-22 HISTORY — DX: Diplopia: H53.2

## 2017-01-29 LAB — HM DIABETES EYE EXAM

## 2017-02-04 ENCOUNTER — Encounter: Payer: Self-pay | Admitting: Family Medicine

## 2017-02-08 ENCOUNTER — Ambulatory Visit: Payer: Medicare Other | Admitting: Endocrinology

## 2017-02-27 ENCOUNTER — Other Ambulatory Visit: Payer: Self-pay | Admitting: Family Medicine

## 2017-03-06 ENCOUNTER — Other Ambulatory Visit: Payer: Self-pay | Admitting: Family Medicine

## 2017-03-06 DIAGNOSIS — Z1231 Encounter for screening mammogram for malignant neoplasm of breast: Secondary | ICD-10-CM

## 2017-03-21 ENCOUNTER — Encounter: Payer: Self-pay | Admitting: Endocrinology

## 2017-03-21 ENCOUNTER — Ambulatory Visit (INDEPENDENT_AMBULATORY_CARE_PROVIDER_SITE_OTHER): Payer: Medicare Other | Admitting: Endocrinology

## 2017-03-21 VITALS — BP 126/80 | HR 74 | Ht 64.0 in | Wt 215.0 lb

## 2017-03-21 DIAGNOSIS — E890 Postprocedural hypothyroidism: Secondary | ICD-10-CM | POA: Diagnosis not present

## 2017-03-21 LAB — TSH: TSH: 0.62 u[IU]/mL (ref 0.35–4.50)

## 2017-03-21 LAB — T3, FREE: T3, Free: 3.3 pg/mL (ref 2.3–4.2)

## 2017-03-21 LAB — T4, FREE: Free T4: 0.92 ng/dL (ref 0.60–1.60)

## 2017-03-21 NOTE — Progress Notes (Signed)
Patient ID: Dawn Salas, female   DOB: 01-04-1941, 77 y.o.   MRN: [id number]                                                                                                                  Chief complaint: Endocrinology follow-up   History of Present Illness:   History obtained on initial consultation:  She was diagnosed as having Hyperthyroidism in 2015 when she was having palpitations and was seen to have a goiter. At that time she was having the usual symptoms of fatigue, shakiness, palpitations, heat intolerance and some weight loss No details of this are available She was treated with I-131, unknown dose which was prescribed in March 2015 Apparently she had a large hot nodule on the left, however details of her treatment are not available  Apparently subsequently her hyperthyroidism recurred and methimazole restarted ?  In 4/17 Also the dose was increased up to 7.5 mg in 10/2015  Apparently prior to her being treated for the hot nodule she had a multinodular goiter evaluated with a fine-needle aspiration in 09/2010  Since she was significantly hyperthyroid on her initial exam in February and free T3 was 5.0 and was treated with methimazole Because of persistent hyperthyroidism and very high thyrotropin receptor antibody she was given I-131 treatment with 20.3 mCi on 06/04/16  RECENT history:   She has been HYPOTHYROID as of 7/18 with low free T4 level  She was also having some fatigue and cold sensitivity as well as the muscle cramps With starting levothyroxine 125 g she felt better, however subsequently TSH was low again she was reduced down to 100 g  She does continue feels fairly good and no new fatigue Her weight is stable She is not having any consistent heat or cold intolerance, may feel either 1 at times At times may feel a little nervous  Her levothyroxine dose has been gradually reduced further and she is now taking her 100 g of levothyroxine 6 days a week  only  She takes her levothyroxine consistently in the mornings  Ophthalmopathy:  She is still having diplopia when looking to her left and this has been compensated with the prism lens, she is planning to get a permanent prism lens She does have significant changes in her left orbital muscles on MRI    Wt Readings from Last 3 Encounters:  03/21/17 215 lb (97.5 kg)  01/02/17 216 lb 4.8 oz (98.1 kg)  11/21/16 215 lb 9.6 oz (97.8 kg)      Thyroid function tests as follows:     Lab Results  Component Value Date   FREET4 1.03 01/02/2017   FREET4 1.11 11/21/2016   FREET4 1.53 10/09/2016   T3FREE 3.1 07/06/2016   T3FREE 3.6 05/23/2016   T3FREE 5.0 (H) 03/12/2016   TSH 0.20 (L) 01/02/2017   TSH 0.12 (L) 11/21/2016   TSH 0.01 Repeated and verified X2. (L) 10/09/2016    Lab Results  Component Value Date   THYROTRECAB 87.70 (H) 04/11/2016  Allergies as of 03/21/2017      Reactions   Flagyl [metronidazole] Other (See Comments)   Caused increased heart rate   Aspirin Effervescent Swelling   Alka-Seltzer gel caps- sore mouth, face swollen, turned hands white   Crestor [rosuvastatin]    Morphine Sulfate Hives   Hallucinations   Penicillins Swelling, Rash   Has patient had a PCN reaction causing immediate rash, facial/tongue/throat swelling, SOB or lightheadedness with hypotension: Yes Has patient had a PCN reaction causing severe rash involving mucus membranes or skin necrosis: No Has patient had a PCN reaction that required hospitalization No Has patient had a PCN reaction occurring within the last 10 years: No If all of the above answers are "NO", then may proceed with Cephalosporin use.      Medication List        Accurate as of 03/21/17 10:11 AM. Always use your most recent med list.          ACCU-CHEK AVIVA PLUS test strip Generic drug:  glucose blood TEST BLOOD SUGAR TWICE DAILY.   accu-chek softclix lancets 1 each by Other route 2 (two) times daily.  E11.9   ACCU-CHEK SOFTCLIX LANCETS lancets TEST BLOOD SUGAR TWICE DAILY.   amLODipine 2.5 MG tablet Commonly known as:  NORVASC TAKE ONE (1) TABLET EACH DAY   cetirizine 10 MG tablet Commonly known as:  ZYRTEC Take 10 mg by mouth daily as needed. For allergies   furosemide 40 MG tablet Commonly known as:  LASIX Take 0.5 tablets (20 mg total) by mouth daily as needed. For fluid   GERITOL COMPLETE PO Take 1 tablet by mouth daily.   levothyroxine 100 MCG tablet Commonly known as:  SYNTHROID, LEVOTHROID Take 1 tablet (100 mcg total) by mouth daily.   losartan-hydrochlorothiazide 100-12.5 MG tablet Commonly known as:  HYZAAR TAKE 1 TABLET DAILY   Magnesium 250 MG Tabs Take 250 mg by mouth daily.   metFORMIN 500 MG tablet Commonly known as:  GLUCOPHAGE TAKE ONE TABLET TWICE A DAY WITH FOOD   metoprolol succinate 100 MG 24 hr tablet Commonly known as:  TOPROL-XL TAKE ONE (1) TABLET EACH DAY   potassium chloride 10 MEQ tablet Commonly known as:  K-DUR TAKE ONE (1) TABLET EACH DAY   promethazine 25 MG tablet Commonly known as:  PHENERGAN Take 1 tablet (25 mg total) by mouth every 6 (six) hours as needed for nausea or vomiting.   RABEprazole 20 MG tablet Commonly known as:  ACIPHEX TAKE ONE (1) TABLET EACH DAY   valACYclovir 1000 MG tablet Commonly known as:  VALTREX TAKE 2 TABS TWICE DAILY FOR 1 DAY THEN AS NEEDED   VESICARE 5 MG tablet Generic drug:  solifenacin TAKE 1 TABLET DAILY   VITAMIN B12 PO Take 1 mL by mouth daily. OTC           Past Medical History:  Diagnosis Date  . Allergy   . Anxiety   . Arthritis   . Asthma    patient denies   . Depression   . Diabetes mellitus    Type two  . Diverticulitis   . Family history of adverse reaction to anesthesia    daughter- N/V   . Frequency of urination   . GERD (gastroesophageal reflux disease)   . History of hiatal hernia   . Hyperglycemia   . Hyperlipidemia   . Hypertension   .  Hyperthyroidism    PT HAD BIPOSY -NEGATIVE.Marland Kitchen AND NOW JUST GETS CHECKED EACH YEAR .Marland Kitchen  2013 LAST TEST   SEES DR. [name].   . 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 Obstructive sleep apnea    STUDY AT [facility] DOES NOT USE C PAP  . Palpitations   . Peptic ulcer disease   . Pneumonia    "walking pneumonia" hx of  . Seizures ([facility])    one siezure with brain surgery none since 2015     Past Surgical History:  Procedure Laterality Date  . ABDOMINAL HYSTERECTOMY    . BACK SURGERY     L SPIN 2003   NECK 2004  . BRAIN MENINGIOMA EXCISION  8/10   X 2  . C5-C6 neck fusion    . COLONOSCOPY    . CRANIOTOMY  08/13/2011   Procedure: CRANIOTOMY TUMOR EXCISION;  Surgeon: Cristi Loron, MD;  Location: [facility];  Service: Neurosurgery;  Laterality: Bilateral;  Bifrontal craniotomy for tumor  . DILATION AND CURETTAGE OF UTERUS     YEARS AGO  . L4-L5 posterior la    . left foot plantar and hammertoe    . right foot bunectomy    . right knee arthroscopy     x 3  . right shoulder rotator cuff    . ROBOTIC ASSISTED BILATERAL SALPINGO OOPHERECTOMY N/A 08/25/2015   Procedure: XI ROBOTIC ASSISTED BILATERAL SALPINGO OOPHORECTOMY;  Surgeon: Laurette Schimke, MD;  Location: [facility];  Service: Gynecology;  Laterality: N/A;  . TONSILLECTOMY    . TUBAL LIGATION    . WRIST SURGERY Right     Family History  Problem Relation Age of Onset  . Cancer Mother        ovarian  . Heart disease Father 31  . Arthritis Other   . Hyperlipidemia Other   . Hypertension Other   . Diabetes Other   . Anesthesia problems Daughter        NAUSEA AND VOMITING POST OP  . Breast cancer Neg Hx     Social History:  reports that  has never smoked. she has never used smokeless tobacco. She reports that she does not drink alcohol or use drugs.  Allergies:  Allergies  Allergen Reactions  . Flagyl [Metronidazole] Other (See Comments)    Caused increased heart rate  . Aspirin Effervescent Swelling    Alka-Seltzer  gel caps- sore mouth, face swollen, turned hands white  . Crestor [Rosuvastatin]   . Morphine Sulfate Hives    Hallucinations  . Penicillins Swelling and Rash    Has patient had a PCN reaction causing immediate rash, facial/tongue/throat swelling, SOB or lightheadedness with hypotension: Yes Has patient had a PCN reaction causing severe rash involving mucus membranes or skin necrosis: No Has patient had a PCN reaction that required hospitalization No Has patient had a PCN reaction occurring within the last 10 years: No If all of the above answers are "NO", then may proceed with Cephalosporin use.       Review of Systems  Cardiovascular: Positive for palpitations.  Endocrine: Positive for cold intolerance and heat intolerance.  Psychiatric/Behavioral: Positive for nervousness.      DIABETES  Followed by PCP and treated with metformin  Blood sugars at home are about 110 meals and 140-150 after   Lab Results  Component Value Date   HGBA1C 6.1 01/02/2017   HGBA1C 6.6 (H) 08/08/2016   HGBA1C 6.6 (H) 04/02/2016   Lab Results  Component Value Date   LDLCALC 155 (H) 04/02/2016   CREATININE 1.24 (H) 10/02/2016  Examination:   BP 126/80   Pulse 74   Ht 5\' 4"  (1.626 m)   Wt 215 lb (97.5 kg)   SpO2 98%   BMI 36.90 kg/m    Left lobe shows indistinct thyroid enlargement Right lobe is barely palpable No tremor Biceps reflexes on the left normal    Assessment/Plan:  Graves' disease treated with radioactive iodine and has had subsequent HYPOTHYROIDISM  Her goiter has reduced in size and not clearly palpable today  Appears to be requiring less thyroid supplement for the last few months and is now taking no 100 g levothyroxine only 6 days a week Last TSH was slightly low again  She is not having any consistent symptoms of hyper or hypothyroidism and will need to assess her labs today  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  Reather Littler 03/21/2017, 10:11 AM     Addendum: TSH  0.62 and she will not change her dosage, follow-up in 4 months  Isaly Fasching Lucianne Muss

## 2017-04-02 ENCOUNTER — Ambulatory Visit (INDEPENDENT_AMBULATORY_CARE_PROVIDER_SITE_OTHER): Payer: Medicare Other | Admitting: Family Medicine

## 2017-04-02 ENCOUNTER — Encounter: Payer: Self-pay | Admitting: Family Medicine

## 2017-04-02 VITALS — BP 110/80 | HR 76 | Temp 97.8°F | Wt 216.2 lb

## 2017-04-02 DIAGNOSIS — N181 Chronic kidney disease, stage 1: Secondary | ICD-10-CM

## 2017-04-02 DIAGNOSIS — E1122 Type 2 diabetes mellitus with diabetic chronic kidney disease: Secondary | ICD-10-CM | POA: Diagnosis not present

## 2017-04-02 DIAGNOSIS — L819 Disorder of pigmentation, unspecified: Secondary | ICD-10-CM | POA: Diagnosis not present

## 2017-04-02 LAB — POCT GLYCOSYLATED HEMOGLOBIN (HGB A1C): Hemoglobin A1C: 6.3

## 2017-04-02 NOTE — Patient Instructions (Signed)
Try to avoid scratching skin as much as possible A1C is 6.3% which is well controlled.

## 2017-04-02 NOTE — Progress Notes (Signed)
Subjective:     Patient ID: Dawn Salas, female   DOB: 25-May-1940, 77 y.o.   MRN: [id number]  HPI Patient here for the following issues  Diabetes follow-up. She has type 2 diabetes on metformin. She is back to fairly regular exercise. A1c today 6.3%. No polydipsia or polyuria.  Patient's had some discoloration dorsum of left foot for the past 3-4 weeks. She's not noted any warmth or any pain. She denies any injury. She has history of nonspecific dermatitis of on her legs his had some pigment changes there related to that. She denies any pruritus or rash involving the feet. Placed on topical steroid per dermatology recently for her leg dermatitis  Past Medical History:  Diagnosis Date  . Allergy   . Anxiety   . Arthritis   . Asthma    patient denies   . Depression   . Diabetes mellitus    Type two  . Diverticulitis   . Family history of adverse reaction to anesthesia    daughter- N/V   . Frequency of urination   . GERD (gastroesophageal reflux disease)   . History of hiatal hernia   . Hyperglycemia   . Hyperlipidemia   . Hypertension   . Hyperthyroidism    PT HAD BIPOSY -NEGATIVE.Marland Kitchen AND NOW JUST GETS CHECKED EACH YEAR .Marland Kitchen 2013 LAST TEST   SEES DR. [name].   . Multiple meningiomas of spine and brain ([place])   . Obstructive sleep apnea    STUDY AT [facility] DOES NOT USE C PAP  . Palpitations   . Peptic ulcer disease   . Pneumonia    "walking pneumonia" hx of  . Seizures ([place])    one siezure with brain surgery none since 2015    Past Surgical History:  Procedure Laterality Date  . ABDOMINAL HYSTERECTOMY    . BACK SURGERY     L SPIN 2003   NECK 2004  . BRAIN MENINGIOMA EXCISION  8/10   X 2  . C5-C6 neck fusion    . COLONOSCOPY    . CRANIOTOMY  08/13/2011   Procedure: CRANIOTOMY TUMOR EXCISION;  Surgeon: [name], MD;  Location: [facility];  Service: Neurosurgery;  Laterality: Bilateral;  Bifrontal craniotomy for tumor  . DILATION AND CURETTAGE OF UTERUS     YEARS  AGO  . L4-L5 posterior la    . left foot plantar and hammertoe    . right foot bunectomy    . right knee arthroscopy     x 3  . right shoulder rotator cuff    . ROBOTIC ASSISTED BILATERAL SALPINGO OOPHERECTOMY N/A 08/25/2015   Procedure: XI ROBOTIC ASSISTED BILATERAL SALPINGO OOPHORECTOMY;  Surgeon: [name], MD;  Location: [facility];  Service: Gynecology;  Laterality: N/A;  . TONSILLECTOMY    . TUBAL LIGATION    . WRIST SURGERY Right     reports that  has never smoked. she has never used smokeless tobacco. She reports that she does not drink alcohol or use drugs. family history includes Anesthesia problems in her daughter; Arthritis in her other; Cancer in her mother; Diabetes in her other; Heart disease (age of onset: 54) in her father; Hyperlipidemia in her other; Hypertension in her other. Allergies  Allergen Reactions  . Flagyl [Metronidazole] Other (See Comments)    Caused increased heart rate  . Aspirin Effervescent Swelling    Alka-Seltzer gel caps- sore mouth, face swollen, turned hands white  . Crestor [Rosuvastatin]   . Morphine Sulfate  Hives    Hallucinations  . Penicillins Swelling and Rash    Has patient had a PCN reaction causing immediate rash, facial/tongue/throat swelling, SOB or lightheadedness with hypotension: Yes Has patient had a PCN reaction causing severe rash involving mucus membranes or skin necrosis: No Has patient had a PCN reaction that required hospitalization No Has patient had a PCN reaction occurring within the last 10 years: No If all of the above answers are "NO", then may proceed with Cephalosporin use.      Review of Systems  Constitutional: Negative for chills and fever.  Endocrine: Negative for polydipsia and polyuria.  Musculoskeletal: Negative for gait problem.       Objective:   Physical Exam  Constitutional: She appears well-developed and well-nourished.  Cardiovascular: Normal rate and regular rhythm.  Pulmonary/Chest: Effort  normal and breath sounds normal. No respiratory distress. She has no wheezes. She has no rales.  Musculoskeletal: She exhibits no edema.  Skin:  Left foot reveals some darkened discoloration involving most the distal aspect of the foot. There is no warmth or erythema. No tenderness to palpation. Good capillary refill. Distal pulses normal.  No skin breakdown or necrosis. No ulcerations.  No interdigital rash       Assessment:     #1 type 2 diabetes well controlled with A1c 6.3%  #2 discoloration dorsum left foot. This looks almost like bruising but has been present for already about 3 weeks. She has no bony tenderness whatsoever and if this is some bruising probably related to spontaneous vessel rupture. No suspicion for underlying bony issue or injury. We also did explain that frequently people with increased pigment will sometimes get postinflammatory hyperpigmentation. She has no evidence to suggest cellulitis      Plan:     -Touch base in 3-4 weeks if (discoloration) not resolving -Continue metformin -Continue regular exercise habits  Eulas Post MD [facility] at [facility]

## 2017-04-05 ENCOUNTER — Other Ambulatory Visit: Payer: Self-pay | Admitting: Family Medicine

## 2017-04-10 ENCOUNTER — Ambulatory Visit
Admission: RE | Admit: 2017-04-10 | Discharge: 2017-04-10 | Disposition: A | Discharge status: Home / Self Care | Payer: Medicare Other | Source: Ambulatory Visit | Attending: Family Medicine | Admitting: Family Medicine

## 2017-04-10 DIAGNOSIS — Z1231 Encounter for screening mammogram for malignant neoplasm of breast: Secondary | ICD-10-CM

## 2017-04-15 ENCOUNTER — Other Ambulatory Visit: Payer: Self-pay | Admitting: Family Medicine

## 2017-06-04 ENCOUNTER — Other Ambulatory Visit: Payer: Self-pay | Admitting: Family Medicine

## 2017-07-10 ENCOUNTER — Ambulatory Visit (INDEPENDENT_AMBULATORY_CARE_PROVIDER_SITE_OTHER): Payer: Medicare Other

## 2017-07-10 ENCOUNTER — Other Ambulatory Visit: Payer: Self-pay | Admitting: Family Medicine

## 2017-07-10 ENCOUNTER — Encounter: Payer: Self-pay | Admitting: Family Medicine

## 2017-07-10 ENCOUNTER — Ambulatory Visit (INDEPENDENT_AMBULATORY_CARE_PROVIDER_SITE_OTHER): Payer: Medicare Other | Admitting: Family Medicine

## 2017-07-10 VITALS — BP 132/74 | HR 85 | Temp 98.0°F | Wt 216.7 lb

## 2017-07-10 DIAGNOSIS — R0781 Pleurodynia: Secondary | ICD-10-CM | POA: Diagnosis not present

## 2017-07-10 NOTE — Progress Notes (Signed)
Subjective:     Patient ID: Dawn Salas, female   DOB: 15-Mar-1940, 77 y.o.   MRN: [id number]  HPI Patient seen with left rib pain for about 3 weeks duration. Denies any injury. Pain is worse when lying down particularly on her left side. No pleuritic pain. No cough. No dyspnea. No dysuria. No true flank pain. Pain is more lateral lower rib cage area. No skin rashes. No alleviating factors. Pain is mild-to-moderate.   No rashes.   Past Medical History:  Diagnosis Date  . Allergy   . Anxiety   . Arthritis   . Asthma    patient denies   . Depression   . Diabetes mellitus    Type two  . Diverticulitis   . Family history of adverse reaction to anesthesia    daughter- N/V   . Frequency of urination   . GERD (gastroesophageal reflux disease)   . History of hiatal hernia   . Hyperglycemia   . Hyperlipidemia   . Hypertension   . Hyperthyroidism    PT HAD BIPOSY -NEGATIVE.Marland Kitchen AND NOW JUST GETS CHECKED EACH YEAR .Marland Kitchen 2013 LAST TEST   SEES DR. [name].   . Multiple meningiomas of spine and brain ([place])   . Obstructive sleep apnea    STUDY AT [facility] DOES NOT USE C PAP  . Palpitations   . Peptic ulcer disease   . Pneumonia    "walking pneumonia" hx of  . Seizures ([place])    one siezure with brain surgery none since 2015    Past Surgical History:  Procedure Laterality Date  . ABDOMINAL HYSTERECTOMY    . BACK SURGERY     L SPIN 2003   NECK 2004  . BRAIN MENINGIOMA EXCISION  8/10   X 2  . C5-C6 neck fusion    . COLONOSCOPY    . CRANIOTOMY  08/13/2011   Procedure: CRANIOTOMY TUMOR EXCISION;  Surgeon: [name], MD;  Location: [facility];  Service: Neurosurgery;  Laterality: Bilateral;  Bifrontal craniotomy for tumor  . DILATION AND CURETTAGE OF UTERUS     YEARS AGO  . L4-L5 posterior la    . left foot plantar and hammertoe    . right foot bunectomy    . right knee arthroscopy     x 3  . right shoulder rotator cuff    . ROBOTIC ASSISTED BILATERAL SALPINGO OOPHERECTOMY N/A  08/25/2015   Procedure: XI ROBOTIC ASSISTED BILATERAL SALPINGO OOPHORECTOMY;  Surgeon: [name], MD;  Location: [facility];  Service: Gynecology;  Laterality: N/A;  . TONSILLECTOMY    . TUBAL LIGATION    . WRIST SURGERY Right     reports that she has never smoked. She has never used smokeless tobacco. She reports that she does not drink alcohol or use drugs. family history includes Anesthesia problems in her daughter; Arthritis in her other; Cancer in her mother; Diabetes in her other; Heart disease (age of onset: 73) in her father; Hyperlipidemia in her other; Hypertension in her other. Allergies  Allergen Reactions  . Flagyl [Metronidazole] Other (See Comments)    Caused increased heart rate  . Aspirin Effervescent Swelling    Alka-Seltzer gel caps- sore mouth, face swollen, turned hands white  . Crestor [Rosuvastatin]   . Morphine Sulfate Hives    Hallucinations  . Penicillins Swelling and Rash    Has patient had a PCN reaction causing immediate rash, facial/tongue/throat swelling, SOB or lightheadedness with hypotension: Yes Has patient had a  PCN reaction causing severe rash involving mucus membranes or skin necrosis: No Has patient had a PCN reaction that required hospitalization No Has patient had a PCN reaction occurring within the last 10 years: No If all of the above answers are "NO", then may proceed with Cephalosporin use.      Review of Systems  Constitutional: Negative for appetite change, chills, fever and unexpected weight change.  Respiratory: Negative for cough and shortness of breath.   Cardiovascular: Negative for chest pain.  Gastrointestinal: Negative for abdominal pain.  Genitourinary: Negative for flank pain.  Skin: Negative for rash.       Objective:   Physical Exam  Constitutional: She appears well-developed and well-nourished.  Cardiovascular: Normal rate and regular rhythm.  Pulmonary/Chest: Effort normal and breath sounds normal.  Abdominal: Soft.  She exhibits no mass. There is no tenderness. There is no rebound and no guarding. No hernia.  Musculoskeletal:  Patient has some tenderness over the lateral rib cage area around the 10th rib region. No overlying swelling or skin changes.       Assessment:     Left rib pain of 3 weeks' duration. No specific injury. This definitely seem more musculoskeletal as she has tenderness to palpation and pain is worse with movement    Plan:     -Given duration go ahead and check left rib films  Eulas Post MD [facility] at [facility]

## 2017-07-15 ENCOUNTER — Other Ambulatory Visit: Payer: Self-pay | Admitting: Family Medicine

## 2017-07-19 ENCOUNTER — Ambulatory Visit (INDEPENDENT_AMBULATORY_CARE_PROVIDER_SITE_OTHER): Payer: Medicare Other | Admitting: Endocrinology

## 2017-07-19 ENCOUNTER — Encounter: Payer: Self-pay | Admitting: Endocrinology

## 2017-07-19 VITALS — BP 120/76 | HR 86 | Ht 64.0 in | Wt 219.4 lb

## 2017-07-19 DIAGNOSIS — E1169 Type 2 diabetes mellitus with other specified complication: Secondary | ICD-10-CM | POA: Diagnosis not present

## 2017-07-19 DIAGNOSIS — E890 Postprocedural hypothyroidism: Secondary | ICD-10-CM | POA: Diagnosis not present

## 2017-07-19 DIAGNOSIS — E669 Obesity, unspecified: Secondary | ICD-10-CM

## 2017-07-19 LAB — BASIC METABOLIC PANEL
BUN: 19 mg/dL (ref 6–23)
CO2: 26 mEq/L (ref 19–32)
Calcium: 8.9 mg/dL (ref 8.4–10.5)
Chloride: 104 mEq/L (ref 96–112)
Creatinine, Ser: 1.3 mg/dL — ABNORMAL HIGH (ref 0.40–1.20)
GFR: 51.4 mL/min — ABNORMAL LOW (ref >60.00)
Glucose, Bld: 121 mg/dL — ABNORMAL HIGH (ref 70–99)
Potassium: 3.8 mEq/L (ref 3.5–5.1)
Sodium: 140 mEq/L (ref 135–145)

## 2017-07-19 LAB — HEMOGLOBIN A1C: Hgb A1c MFr Bld: 6.6 % — ABNORMAL HIGH (ref 4.6–6.5)

## 2017-07-19 LAB — TSH: TSH: 0.71 u[IU]/mL (ref 0.35–4.50)

## 2017-07-19 NOTE — Progress Notes (Signed)
Patient ID: Dawn Salas, female   DOB: 12-11-1940, 77 y.o.   MRN: [id number]                                                                                                                  Chief complaint: Endocrinology follow-up   History of Present Illness:   History obtained on initial consultation:  She was diagnosed as having Hyperthyroidism in 2015 when she was having palpitations and was seen to have a goiter. At that time she was having the usual symptoms of fatigue, shakiness, palpitations, heat intolerance and some weight loss No details of this are available She was treated with I-131, unknown dose which was prescribed in March 2015 Apparently she had a large hot nodule on the left, however details of her treatment are not available  Apparently subsequently her hyperthyroidism recurred and methimazole restarted ?  In 4/17 Also the dose was increased up to 7.5 mg in 10/2015  Apparently prior to her being treated for the hot nodule she had a multinodular goiter evaluated with a fine-needle aspiration in 09/2010  Since she was significantly hyperthyroid on her initial exam in February and free T3 was 5.0 and was treated with methimazole Because of persistent hyperthyroidism and very high thyrotropin receptor antibody she was given I-131 treatment with 20.3 mCi on 06/04/16  RECENT history:  She has been HYPOTHYROID as of 7/18 with a low free T4 level  She was also having some fatigue and cold sensitivity as well as the muscle cramps With starting levothyroxine 125 g she felt better, however subsequently TSH was low again she was reduced down to 100 g  Her last visit was in 02/2017 Since then she has not had any change in her energy level and feels fairly good No consistent heat or cold intolerance  Her levothyroxine dose has been gradually reduced in 2018 and she is now taking her 100 g of levothyroxine 6 days a week only  She takes her levothyroxine consistently  in the mornings  Ophthalmopathy:  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 and is doing well with this No prominence of the eyes or dryness, followed by ophthalmologist    Wt Readings from Last 3 Encounters:  07/19/17 219 lb 6.4 oz (99.5 kg)  07/10/17 216 lb 11.2 oz (98.3 kg)  04/02/17 216 lb 3.2 oz (98.1 kg)      Thyroid function tests as follows:     Lab Results  Component Value Date   FREET4 0.92 03/21/2017   FREET4 1.03 01/02/2017   FREET4 1.11 11/21/2016   T3FREE 3.3 03/21/2017   T3FREE 3.1 07/06/2016   T3FREE 3.6 05/23/2016   TSH 0.62 03/21/2017   TSH 0.20 (L) 01/02/2017   TSH 0.12 (L) 11/21/2016    Lab Results  Component Value Date   THYROTRECAB 87.70 (H) 04/11/2016   DIABETES: See review of systems     Allergies as of 07/19/2017      Reactions   Flagyl [metronidazole] Other (  See Comments)   Caused increased heart rate   Aspirin Effervescent Swelling   Alka-Seltzer gel caps- sore mouth, face swollen, turned hands white   Crestor [rosuvastatin]    Morphine Sulfate Hives   Hallucinations   Penicillins Swelling, Rash   Has patient had a PCN reaction causing immediate rash, facial/tongue/throat swelling, SOB or lightheadedness with hypotension: Yes Has patient had a PCN reaction causing severe rash involving mucus membranes or skin necrosis: No Has patient had a PCN reaction that required hospitalization No Has patient had a PCN reaction occurring within the last 10 years: No If all of the above answers are "NO", then may proceed with Cephalosporin use.      Medication List        Accurate as of 07/19/17 10:46 AM. Always use your most recent med list.          ACCU-CHEK AVIVA PLUS test strip Generic drug:  glucose blood TEST BLOOD SUGAR TWICE DAILY.   accu-chek softclix lancets 1 each by Other route 2 (two) times daily. E11.9   ACCU-CHEK SOFTCLIX LANCETS lancets TEST BLOOD SUGAR TWICE DAILY.   amLODipine 2.5 MG  tablet Commonly known as:  NORVASC TAKE ONE (1) TABLET EACH DAY   cetirizine 10 MG tablet Commonly known as:  ZYRTEC Take 10 mg by mouth daily as needed. For allergies   furosemide 40 MG tablet Commonly known as:  LASIX Take 0.5 tablets (20 mg total) by mouth daily as needed. For fluid   GERITOL COMPLETE PO Take 1 tablet by mouth daily.   levothyroxine 100 MCG tablet Commonly known as:  SYNTHROID, LEVOTHROID Take 1 tablet (100 mcg total) by mouth daily.   losartan-hydrochlorothiazide 100-12.5 MG tablet Commonly known as:  HYZAAR TAKE 1 TABLET DAILY   Magnesium 250 MG Tabs Take 250 mg by mouth daily.   metFORMIN 500 MG tablet Commonly known as:  GLUCOPHAGE TAKE ONE TABLET TWICE A DAY WITH FOOD   metoprolol succinate 100 MG 24 hr tablet Commonly known as:  TOPROL-XL TAKE ONE (1) TABLET EACH DAY   potassium chloride 10 MEQ tablet Commonly known as:  K-DUR TAKE ONE (1) TABLET EACH DAY   promethazine 25 MG tablet Commonly known as:  PHENERGAN Take 1 tablet (25 mg total) by mouth every 6 (six) hours as needed for nausea or vomiting.   RABEprazole 20 MG tablet Commonly known as:  ACIPHEX TAKE ONE (1) TABLET EACH DAY   valACYclovir 1000 MG tablet Commonly known as:  VALTREX TAKE 2 TABS TWICE DAILY FOR 1 DAY THEN AS NEEDED   VESICARE 5 MG tablet Generic drug:  solifenacin TAKE 1 TABLET DAILY   VITAMIN B12 PO Take 1 mL by mouth daily. OTC           Past Medical History:  Diagnosis Date  . Allergy   . Anxiety   . Arthritis   . Asthma    patient denies   . Depression   . Diabetes mellitus    Type two  . Diverticulitis   . Family history of adverse reaction to anesthesia    daughter- N/V   . Frequency of urination   . GERD (gastroesophageal reflux disease)   . History of hiatal hernia   . Hyperglycemia   . Hyperlipidemia   . Hypertension   . Hyperthyroidism    PT HAD BIPOSY -NEGATIVE.Marland Kitchen AND NOW JUST GETS CHECKED EACH YEAR .Marland Kitchen 2013 LAST TEST   SEES DR.  [name].   . Multiple meningiomas of spine  and brain ([place])   . Obstructive sleep apnea    [facility]UDY AT [facility] DOES NOT USE C PAP  . Palpitations   . Peptic ulcer disease   . Pneumonia    "walking pneumonia" hx of  . Seizures ([place])    one siezure with brain surgery none since 2015     Past Surgical History:  Procedure Laterality Date  . ABDOMINAL HYSTERECTOMY    . BACK SURGERY     L SPIN 2003   NECK 2004  . BRAIN MENINGIOMA EXCISION  8/10   X 2  . C5-C6 neck fusion    . COLONOSCOPY    . CRANIOTOMY  08/13/2011   Procedure: CRANIOTOMY TUMOR EXCISION;  Surgeon: [name], MD;  Location: [place] [facility];  Service: Neurosurgery;  Laterality: Bilateral;  Bifrontal craniotomy for tumor  . DILATION AND CURETTAGE OF UTERUS     YEARS AGO  . L4-L5 posterior la    . left foot plantar and hammertoe    . right foot bunectomy    . right knee arthroscopy     x 3  . right shoulder rotator cuff    . ROBOTIC ASSISTED BILATERAL SALPINGO OOPHERECTOMY N/A 08/25/2015   Procedure: XI ROBOTIC ASSISTED BILATERAL SALPINGO OOPHORECTOMY;  Surgeon: [name], MD;  Location: [facility];  Service: Gynecology;  Laterality: N/A;  . TONSILLECTOMY    . TUBAL LIGATION    . WRIST SURGERY Right     Family History  Problem Relation Age of Onset  . Cancer Mother        ovarian  . Heart disease Father 39  . Arthritis Other   . Hyperlipidemia Other   . Hypertension Other   . Diabetes Other   . Anesthesia problems Daughter        NAUSEA AND VOMITING POST OP  . Breast cancer Neg Hx     Social History:  reports that she has never smoked. She has never used smokeless tobacco. She reports that she does not drink alcohol or use drugs.  Allergies:  Allergies  Allergen Reactions  . Flagyl [Metronidazole] Other (See Comments)    Caused increased heart rate  . Aspirin Effervescent Swelling    Alka-Seltzer gel caps- sore mouth, face swollen, turned hands white  . Crestor [Rosuvastatin]   . Morphine Sulfate  Hives    Hallucinations  . Penicillins Swelling and Rash    Has patient had a PCN reaction causing immediate rash, facial/tongue/throat swelling, SOB or lightheadedness with hypotension: Yes Has patient had a PCN reaction causing severe rash involving mucus membranes or skin necrosis: No Has patient had a PCN reaction that required hospitalization No Has patient had a PCN reaction occurring within the last 10 years: No If all of the above answers are "NO", then may proceed with Cephalosporin use.       Review of Systems    DIABETES  Followed by PCP and treated with metformin twice a day at breakfast and bedtime She says that she does not always eat breakfast At times will feel a little shaky or weak before or after lunch and she does not like her blood sugars to be below 100  Blood sugars at home are about 109-130 in Am and does not check after meals despite reminders    Lab Results  Component Value Date   HGBA1C 6.3 04/02/2017   HGBA1C 6.1 01/02/2017   HGBA1C 6.6 (H) 08/08/2016   Lab Results  Component Value Date   [facility]  155 (H) 04/02/2016   CREATININE 1.24 (H) 10/02/2016      Examination:   BP 120/76 (BP Location: Left Arm, Patient Position: Sitting, Cuff Size: Normal)   Pulse 86   Ht 5\' 4"  (1.626 m)   Wt 219 lb 6.4 oz (99.5 kg)   SpO2 98%   BMI 37.66 kg/m   Thyroid not palpable Skin appears normal  Biceps reflexes on the left normal    Assessment/Plan:  Graves' disease treated with radioactive iodine and has had subsequent HYPOTHYROIDISM  She is now taking no 100 g levothyroxine only 6 days a week She is subjectively doing well Last TSH was slightly low again   Ophthalmopathy: She is having persistent diplopia and is being managed by ophthalmologist  Mild diabetes: This is well controlled on low-dose metformin but A1c is due She also thinks she has hypoglycemia-like symptoms before lunch and this may be related to some time not eating much at  breakfast and taking metformin  Recommendations:  Check thyroid levels again today and decide on dosage  Follow-up in 6 months if labs are normal  She can try taking metformin at lunch and bedtime Discussed that metformin should not cause hypoglycemia overnight However she does need to check readings after meals Encouraged regular exercise program  She will continue to follow-up with PCP for diabetes  There are no Patient Instructions on file for this visit.  Elayne Snare 07/19/2017, 10:46 AM

## 2017-07-19 NOTE — Patient Instructions (Signed)
Check blood sugars on waking up  2-3/7  Also check blood sugars about 2 hours after a meal and do this after different meals by rotation  Recommended blood sugar levels on waking up is 90-130 and about 2 hours after meal is 130-160  Please bring your blood sugar monitor to each visit, thank you  Take Metformin at lunch and bedtime  Low fat meals

## 2017-07-20 ENCOUNTER — Encounter: Payer: Self-pay | Admitting: Endocrinology

## 2017-07-20 NOTE — Progress Notes (Signed)
Please call to let patient know that the thyroid results are normal and no change in dosage needed.  We will see her in 6 months A1c is higher at 6.6 and he needs to check readings after meals, to follow-up with PCP

## 2017-08-05 ENCOUNTER — Other Ambulatory Visit: Payer: Self-pay | Admitting: Endocrinology

## 2017-09-02 ENCOUNTER — Encounter: Payer: Self-pay | Admitting: Family Medicine

## 2017-09-02 ENCOUNTER — Ambulatory Visit (INDEPENDENT_AMBULATORY_CARE_PROVIDER_SITE_OTHER): Payer: Medicare Other | Admitting: Family Medicine

## 2017-09-02 VITALS — BP 140/90 | HR 82 | Temp 97.7°F | Wt 221.3 lb

## 2017-09-02 DIAGNOSIS — R300 Dysuria: Secondary | ICD-10-CM

## 2017-09-02 LAB — POCT URINALYSIS DIPSTICK
Bilirubin, UA: NEGATIVE
Glucose, UA: NEGATIVE
Ketones, UA: NEGATIVE
Nitrite, UA: NEGATIVE
Protein, UA: NEGATIVE
Spec Grav, UA: 1.01 (ref 1.010–1.025)
Urobilinogen, UA: 0.2 E.U./dL
pH, UA: 7 (ref 5.0–8.0)

## 2017-09-02 MED ORDER — CIPROFLOXACIN HCL 500 MG PO TABS: 500.0000 mg | ORAL_TABLET | Freq: Two times a day (BID) | ORAL | 0 refills | Status: DC

## 2017-09-02 NOTE — Patient Instructions (Signed)

## 2017-09-02 NOTE — Progress Notes (Signed)
Subjective:     Patient ID: Dawn Salas, female   DOB: December 25, 1940, 77 y.o.   MRN: [id number]  HPI Patient is seen as a work in with some urinary frequency and burning with urination over the past couple days. She's also noticed some increased odor. No gross hematuria. No nausea or vomiting. No flank pain. No history of recent UTI.  Past Medical History:  Diagnosis Date  . Allergy   . Anxiety   . Arthritis   . Asthma    patient denies   . Depression   . Diabetes mellitus    Type two  . Diverticulitis   . Family history of adverse reaction to anesthesia    daughter- N/V   . Frequency of urination   . GERD (gastroesophageal reflux disease)   . History of hiatal hernia   . Hyperglycemia   . Hyperlipidemia   . Hypertension   . Hyperthyroidism    PT HAD BIPOSY -NEGATIVE.Marland Kitchen AND NOW JUST GETS CHECKED EACH YEAR .Marland Kitchen 2013 LAST TEST   SEES DR. [name].   . Multiple meningiomas of spine and brain ([place])   . Obstructive sleep apnea    STUDY AT [facility] DOES NOT USE C PAP  . Palpitations   . Peptic ulcer disease   . Pneumonia    "walking pneumonia" hx of  . Seizures ([place])    one siezure with brain surgery none since 2015    Past Surgical History:  Procedure Laterality Date  . ABDOMINAL HYSTERECTOMY    . BACK SURGERY     L SPIN 2003   NECK 2004  . BRAIN MENINGIOMA EXCISION  8/10   X 2  . C5-C6 neck fusion    . COLONOSCOPY    . CRANIOTOMY  08/13/2011   Procedure: CRANIOTOMY TUMOR EXCISION;  Surgeon: [name], MD;  Location: [place] [facility];  Service: Neurosurgery;  Laterality: Bilateral;  Bifrontal craniotomy for tumor  . DILATION AND CURETTAGE OF UTERUS     YEARS AGO  . L4-L5 posterior la    . left foot plantar and hammertoe    . right foot bunectomy    . right knee arthroscopy     x 3  . right shoulder rotator cuff    . ROBOTIC ASSISTED BILATERAL SALPINGO OOPHERECTOMY N/A 08/25/2015   Procedure: XI ROBOTIC ASSISTED BILATERAL SALPINGO OOPHORECTOMY;  Surgeon: [name],  MD;  Location: [facility];  Service: Gynecology;  Laterality: N/A;  . TONSILLECTOMY    . TUBAL LIGATION    . WRIST SURGERY Right     reports that she has never smoked. She has never used smokeless tobacco. She reports that she does not drink alcohol or use drugs. family history includes Anesthesia problems in her daughter; Arthritis in her other; Cancer in her mother; Diabetes in her other; Heart disease (age of onset: 36) in her father; Hyperlipidemia in her other; Hypertension in her other. Allergies  Allergen Reactions  . Flagyl [Metronidazole] Other (See Comments)    Caused increased heart rate  . Aspirin Effervescent Swelling    Alka-Seltzer gel caps- sore mouth, face swollen, turned hands white  . Crestor [Rosuvastatin]   . Morphine Sulfate Hives    Hallucinations  . Penicillins Swelling and Rash    Has patient had a PCN reaction causing immediate rash, facial/tongue/throat swelling, SOB or lightheadedness with hypotension: Yes Has patient had a PCN reaction causing severe rash involving mucus membranes or skin necrosis: No Has patient had a PCN reaction that  required hospitalization No Has patient had a PCN reaction occurring within the last 10 years: No If all of the above answers are "NO", then may proceed with Cephalosporin use.      Review of Systems  Constitutional: Negative for chills and fever.  Gastrointestinal: Negative for nausea and vomiting.  Genitourinary: Positive for dysuria and frequency. Negative for difficulty urinating, flank pain and hematuria.       Objective:   Physical Exam  Constitutional: She appears well-developed and well-nourished.  Cardiovascular: Normal rate and regular rhythm.  Pulmonary/Chest: Effort normal and breath sounds normal.       Assessment:     Couple day history of dysuria. Urine dipstick reveals blood and leukocytes.    Plan:     -Urine culture sent -Start Cipro 500 mg twice a day for 5 days pending culture results -Drink  plenty of fluids  Eulas Post MD [facility] at [facility]

## 2017-09-04 LAB — URINE CULTURE
MICRO NUMBER:: 90953009
SPECIMEN QUALITY:: ADEQUATE

## 2017-10-01 ENCOUNTER — Encounter: Payer: Self-pay | Admitting: Family Medicine

## 2017-10-01 ENCOUNTER — Ambulatory Visit (INDEPENDENT_AMBULATORY_CARE_PROVIDER_SITE_OTHER): Payer: Medicare Other | Admitting: Family Medicine

## 2017-10-01 VITALS — BP 160/70 | HR 81 | Temp 97.4°F | Wt 219.0 lb

## 2017-10-01 DIAGNOSIS — N181 Chronic kidney disease, stage 1: Secondary | ICD-10-CM

## 2017-10-01 DIAGNOSIS — E1122 Type 2 diabetes mellitus with diabetic chronic kidney disease: Secondary | ICD-10-CM

## 2017-10-01 DIAGNOSIS — N183 Chronic kidney disease, stage 3 unspecified: Secondary | ICD-10-CM | POA: Insufficient documentation

## 2017-10-01 DIAGNOSIS — R1030 Lower abdominal pain, unspecified: Secondary | ICD-10-CM | POA: Diagnosis not present

## 2017-10-01 DIAGNOSIS — I10 Essential (primary) hypertension: Secondary | ICD-10-CM | POA: Diagnosis not present

## 2017-10-01 DIAGNOSIS — R109 Unspecified abdominal pain: Secondary | ICD-10-CM | POA: Diagnosis not present

## 2017-10-01 LAB — CBC WITH DIFFERENTIAL/PLATELET
Basophils Absolute: 0 10*3/uL (ref 0.0–0.1)
Basophils Relative: 1.1 % (ref 0.0–3.0)
Eosinophils Absolute: 0.2 10*3/uL (ref 0.0–0.7)
Eosinophils Relative: 4.8 % (ref 0.0–5.0)
HCT: 37.8 % (ref 36.0–46.0)
Hemoglobin: 12.2 g/dL (ref 12.0–15.0)
Lymphocytes Relative: 18.9 % (ref 12.0–46.0)
Lymphs Abs: 0.9 10*3/uL (ref 0.7–4.0)
MCHC: 32.3 g/dL (ref 30.0–36.0)
MCV: 77.9 fl — ABNORMAL LOW (ref 78.0–100.0)
Monocytes Absolute: 0.4 10*3/uL (ref 0.1–1.0)
Monocytes Relative: 7.8 % (ref 3.0–12.0)
Neutro Abs: 3 10*3/uL (ref 1.4–7.7)
Neutrophils Relative %: 67.4 % (ref 43.0–77.0)
Platelets: 218 10*3/uL (ref 150.0–400.0)
RBC: 4.86 Mil/uL (ref 3.87–5.11)
RDW: 14.9 % (ref 11.5–15.5)
WBC: 4.5 10*3/uL (ref 4.0–10.5)

## 2017-10-01 LAB — POCT URINALYSIS DIPSTICK
Bilirubin, UA: NEGATIVE
Blood, UA: NEGATIVE
Glucose, UA: NEGATIVE
Ketones, UA: NEGATIVE
Leukocytes, UA: NEGATIVE
Nitrite, UA: NEGATIVE
Protein, UA: NEGATIVE
Spec Grav, UA: 1.015 (ref 1.010–1.025)
Urobilinogen, UA: 0.2 E.U./dL
pH, UA: 5.5 (ref 5.0–8.0)

## 2017-10-01 LAB — GLUCOSE, POCT (MANUAL RESULT ENTRY): POC Glucose: 105 mg/dl — AB (ref 70–99)

## 2017-10-01 LAB — BASIC METABOLIC PANEL
BUN: 15 mg/dL (ref 6–23)
CO2: 28 mEq/L (ref 19–32)
Calcium: 9.4 mg/dL (ref 8.4–10.5)
Chloride: 102 mEq/L (ref 96–112)
Creatinine, Ser: 1.12 mg/dL (ref 0.40–1.20)
GFR: 61.02 mL/min (ref >60.00)
Glucose, Bld: 79 mg/dL (ref 70–99)
Potassium: 4.5 mEq/L (ref 3.5–5.1)
Sodium: 140 mEq/L (ref 135–145)

## 2017-10-01 MED ORDER — AMLODIPINE BESYLATE 5 MG PO TABS: 5.0000 mg | ORAL_TABLET | Freq: Every day | ORAL | 3 refills | Status: DC

## 2017-10-01 NOTE — Patient Instructions (Addendum)
Increase Amlodipine to 5 mg daily  Abdominal Pain, Adult Abdominal pain can be caused by many things. Often, abdominal pain is not serious and it gets better with no treatment or by being treated at home. However, sometimes abdominal pain is serious. Your health care provider will do a medical history and a physical exam to try to determine the cause of your abdominal pain. Follow these instructions at home:  Take over-the-counter and prescription medicines only as told by your health care provider. Do not take a laxative unless told by your health care provider.  Drink enough fluid to keep your urine clear or pale yellow.  Watch your condition for any changes.  Keep all follow-up visits as told by your health care provider. This is important. Contact a health care provider if:  Your abdominal pain changes or gets worse.  You are not hungry or you lose weight without trying.  You are constipated or have diarrhea for more than 2-3 days.  You have pain when you urinate or have a bowel movement.  Your abdominal pain wakes you up at night.  Your pain gets worse with meals, after eating, or with certain foods.  You are throwing up and cannot keep anything down.  You have a fever. Get help right away if:  Your pain does not go away as soon as your health care provider told you to expect.  You cannot stop throwing up.  Your pain is only in areas of the abdomen, such as the right side or the left lower portion of the abdomen.  You have bloody or black stools, or stools that look like tar.  You have severe pain, cramping, or bloating in your abdomen.  You have signs of dehydration, such as: ? Dark urine, very little urine, or no urine. ? Cracked lips. ? Dry mouth. ? Sunken eyes. ? Sleepiness. ? Weakness. This information is not intended to replace advice given to you by your health care provider. Make sure you discuss any questions you have with your health care  provider. Document Released: 10/18/2004 Document Revised: 07/29/2015 Document Reviewed: 06/22/2015 Elsevier Interactive Patient Education  [employer].

## 2017-10-01 NOTE — Progress Notes (Signed)
Subjective:     Patient ID: Dawn Salas, female   DOB: [birth year], 77 y.o.   MRN: [id number]  HPI Patient seen for the following issues.  Her main issue is that she's had a couple days of some lower abdominal pain. She describes this as somewhat of achy quality mild to moderate severity and relatively constant though improved today slightly. No alleviating or exacerbating factors. Location is bilateral lower abdominal. She also has some left flank pain which has been more of a chronic pain for the past several months. No appetite or weight changes. No fevers or chills. No gross hematuria.   No history of kidney stones. No stool changes. Last colonoscopy 2011.  Type 2 diabetes. Last A1c 6.6% and this was late June. Fasting blood sugars ranging 106 to 120. Remains on metformin  Hypertension. Currently takes losartan HCTZ one half tablet daily and amlodipine 2.5 mg daily. Recently had some isolated systolic blood pressure readings elevated at 150-160. No headaches. No dizziness. No chest pains. No increased peripheral edema.  Past Medical History:  Diagnosis Date  . Allergy   . Anxiety   . Arthritis   . Asthma    patient denies   . Depression   . Diabetes mellitus    Type two  . Diverticulitis   . Family history of adverse reaction to anesthesia    daughter- N/V   . Frequency of urination   . GERD (gastroesophageal reflux disease)   . History of hiatal hernia   . Hyperglycemia   . Hyperlipidemia   . Hypertension   . Hyperthyroidism    PT HAD BIPOSY -NEGATIVE.Marland Kitchen AND NOW JUST GETS CHECKED EACH YEAR .Marland Kitchen 2013 LAST TEST   SEES DR. [name].   . Multiple meningiomas of spine and brain ([place])   . Obstructive sleep apnea    STUDY AT [facility] DOES NOT USE C PAP  . Palpitations   . Peptic ulcer disease   . Pneumonia    "walking pneumonia" hx of  . Seizures ([place])    one siezure with brain surgery none since 2015    Past Surgical History:  Procedure Laterality Date  . ABDOMINAL  HYSTERECTOMY    . BACK SURGERY     L SPIN 2003   NECK 2004  . BRAIN MENINGIOMA EXCISION  8/10   X 2  . C5-C6 neck fusion    . COLONOSCOPY    . CRANIOTOMY  08/13/2011   Procedure: CRANIOTOMY TUMOR EXCISION;  Surgeon: [name], MD;  Location: [place] [facility];  Service: Neurosurgery;  Laterality: Bilateral;  Bifrontal craniotomy for tumor  . DILATION AND CURETTAGE OF UTERUS     YEARS AGO  . L4-L5 posterior la    . left foot plantar and hammertoe    . right foot bunectomy    . right knee arthroscopy     x 3  . right shoulder rotator cuff    . ROBOTIC ASSISTED BILATERAL SALPINGO OOPHERECTOMY N/A 08/25/2015   Procedure: XI ROBOTIC ASSISTED BILATERAL SALPINGO OOPHORECTOMY;  Surgeon: [name], MD;  Location: [facility];  Service: Gynecology;  Laterality: N/A;  . TONSILLECTOMY    . TUBAL LIGATION    . WRIST SURGERY Right     reports that she has never smoked. She has never used smokeless tobacco. She reports that she does not drink alcohol or use drugs. family history includes Anesthesia problems in her daughter; Arthritis in her other; Cancer in her mother; Diabetes in her other; Heart disease (  age of onset: 19) in her father; Hyperlipidemia in her other; Hypertension in her other. Allergies  Allergen Reactions  . Flagyl [Metronidazole] Other (See Comments)    Caused increased heart rate  . Aspirin Effervescent Swelling    Alka-Seltzer gel caps- sore mouth, face swollen, turned hands white  . Crestor [Rosuvastatin]   . Morphine Sulfate Hives    Hallucinations  . Penicillins Swelling and Rash    Has patient had a PCN reaction causing immediate rash, facial/tongue/throat swelling, SOB or lightheadedness with hypotension: Yes Has patient had a PCN reaction causing severe rash involving mucus membranes or skin necrosis: No Has patient had a PCN reaction that required hospitalization No Has patient had a PCN reaction occurring within the last 10 years: No If all of the above answers  are "NO", then may proceed with Cephalosporin use.        Review of Systems  Constitutional: Negative for appetite change, chills, fatigue, fever and unexpected weight change.  Eyes: Negative for visual disturbance.  Respiratory: Negative for cough, chest tightness, shortness of breath and wheezing.   Cardiovascular: Negative for chest pain, palpitations and leg swelling.  Gastrointestinal: Positive for abdominal pain. Negative for abdominal distention, blood in stool, constipation, diarrhea, nausea and vomiting.  Genitourinary: Positive for flank pain. Negative for dysuria, hematuria and pelvic pain.  Neurological: Negative for dizziness, seizures, syncope, weakness, light-headedness and headaches.       Objective:   Physical Exam  Constitutional: She appears well-developed and well-nourished.  Cardiovascular: Normal rate and regular rhythm.  Pulmonary/Chest: Effort normal and breath sounds normal.  Abdominal: Normal appearance, normal aorta and bowel sounds are normal. She exhibits no distension, no ascites and no mass. There is no hepatosplenomegaly. There is no tenderness. There is no rebound and no guarding.  Skin: No rash noted.       Assessment:     #1 hypertension poorly controlled with initial and repeat reading today 160/70  #2 chronic intermittent left flank pain. Etiology unclear. Question musculoskeletal  #3 bilateral lower abdominal pain of 2 days' duration. Unremarkable exam at this time  No history of known diverticular disease  #4 chronic kidney disease stage III    Plan:     -check further labs with repeat urine dipstick, CBC, basic metabolic panel -Increase amlodipine to 5 mg daily -Bring back in 3 weeks to reassess blood pressure -Follow-up sooner for any progressive abdominal pain, fever, stool changes, or other concerns -consider CT abdomen and pelvis if lower abdominal pain not improving over the next couple of days -Repeat A1c in 3 week follow-up  (this will make 3 months from last check).  Eulas Post MD [facility] at [facility]

## 2017-10-04 ENCOUNTER — Ambulatory Visit: Payer: Medicare Other | Admitting: Family Medicine

## 2017-10-21 ENCOUNTER — Other Ambulatory Visit: Payer: Self-pay | Admitting: Family Medicine

## 2017-10-22 ENCOUNTER — Other Ambulatory Visit: Payer: Self-pay

## 2017-10-22 ENCOUNTER — Encounter: Payer: Self-pay | Admitting: Family Medicine

## 2017-10-22 ENCOUNTER — Ambulatory Visit (INDEPENDENT_AMBULATORY_CARE_PROVIDER_SITE_OTHER): Payer: Medicare Other | Admitting: Family Medicine

## 2017-10-22 VITALS — BP 122/72 | HR 82 | Temp 98.1°F | Ht 64.0 in | Wt 222.2 lb

## 2017-10-22 DIAGNOSIS — I10 Essential (primary) hypertension: Secondary | ICD-10-CM | POA: Diagnosis not present

## 2017-10-22 DIAGNOSIS — E1122 Type 2 diabetes mellitus with diabetic chronic kidney disease: Secondary | ICD-10-CM | POA: Diagnosis not present

## 2017-10-22 DIAGNOSIS — Z23 Encounter for immunization: Secondary | ICD-10-CM | POA: Diagnosis not present

## 2017-10-22 DIAGNOSIS — E785 Hyperlipidemia, unspecified: Secondary | ICD-10-CM | POA: Diagnosis not present

## 2017-10-22 DIAGNOSIS — N181 Chronic kidney disease, stage 1: Secondary | ICD-10-CM

## 2017-10-22 LAB — POCT GLYCOSYLATED HEMOGLOBIN (HGB A1C): Hemoglobin A1C: 6.1 % — AB (ref 4.0–5.6)

## 2017-10-22 MED ORDER — GLUCOSE BLOOD VI STRP: ORAL_STRIP | 3 refills | Status: DC

## 2017-10-22 NOTE — Progress Notes (Signed)
Subjective:     Patient ID: Dawn Salas, female   DOB: 12/24/1940, 77 y.o.   MRN: [id number]  HPI Patient here for medical follow-up.  Her chronic problems include history of obesity, type 2 diabetes, chronic kidney disease, hyperlipidemia, hypertension, hypothyroidism, history of meningiomas  Recent fasting blood sugars have been stable.  Fasting blood sugars usually around 100 to 105.  Last A1c 6.6%.  She has been swimming more for exercise and tightening up her diet.  History of hyperlipidemia.  Previous intolerance with Crestor and Lipitor.  She has been reluctant to consider other statins.  Hypertension treated with amlodipine, losartan HCTZ, and metoprolol.  No headaches or dizziness.  No recent chest pains.  Past Medical History:  Diagnosis Date  . Allergy   . Anxiety   . Arthritis   . Asthma    patient denies   . Depression   . Diabetes mellitus    Type two  . Diverticulitis   . Family history of adverse reaction to anesthesia    daughter- N/V   . Frequency of urination   . GERD (gastroesophageal reflux disease)   . History of hiatal hernia   . Hyperglycemia   . Hyperlipidemia   . Hypertension   . Hyperthyroidism    PT HAD BIPOSY -NEGATIVE.Marland Kitchen AND NOW JUST GETS CHECKED EACH YEAR .Marland Kitchen 2013 LAST TEST   SEES DR. [name].   . Multiple meningiomas of spine and brain ([place])   . Obstructive sleep apnea    STUDY AT [facility] DOES NOT USE C PAP  . Palpitations   . Peptic ulcer disease   . Pneumonia    "walking pneumonia" hx of  . Seizures ([place])    one siezure with brain surgery none since 2015    Past Surgical History:  Procedure Laterality Date  . ABDOMINAL HYSTERECTOMY    . BACK SURGERY     L SPIN 2003   NECK 2004  . BRAIN MENINGIOMA EXCISION  8/10   X 2  . C5-C6 neck fusion    . COLONOSCOPY    . CRANIOTOMY  08/13/2011   Procedure: CRANIOTOMY TUMOR EXCISION;  Surgeon: [name], MD;  Location: [facility];  Service: Neurosurgery;  Laterality: Bilateral;   Bifrontal craniotomy for tumor  . DILATION AND CURETTAGE OF UTERUS     YEARS AGO  . L4-L5 posterior la    . left foot plantar and hammertoe    . right foot bunectomy    . right knee arthroscopy     x 3  . right shoulder rotator cuff    . ROBOTIC ASSISTED BILATERAL SALPINGO OOPHERECTOMY N/A 08/25/2015   Procedure: XI ROBOTIC ASSISTED BILATERAL SALPINGO OOPHORECTOMY;  Surgeon: [name], MD;  Location: [facility];  Service: Gynecology;  Laterality: N/A;  . TONSILLECTOMY    . TUBAL LIGATION    . WRIST SURGERY Right     reports that she has never smoked. She has never used smokeless tobacco. She reports that she does not drink alcohol or use drugs. family history includes Anesthesia problems in her daughter; Arthritis in her other; Cancer in her mother; Diabetes in her other; Heart disease (age of onset: 91) in her father; Hyperlipidemia in her other; Hypertension in her other. Allergies  Allergen Reactions  . Flagyl [Metronidazole] Other (See Comments)    Caused increased heart rate  . Aspirin Effervescent Swelling    Alka-Seltzer gel caps- sore mouth, face swollen, turned hands white  . Crestor [Rosuvastatin]   .  Morphine Sulfate Hives    Hallucinations  . Penicillins Swelling and Rash    Has patient had a PCN reaction causing immediate rash, facial/tongue/throat swelling, SOB or lightheadedness with hypotension: Yes Has patient had a PCN reaction causing severe rash involving mucus membranes or skin necrosis: No Has patient had a PCN reaction that required hospitalization No Has patient had a PCN reaction occurring within the last 10 years: No If all of the above answers are "NO", then may proceed with Cephalosporin use.      Review of Systems  Constitutional: Negative for fatigue.  Eyes: Negative for visual disturbance.  Respiratory: Negative for cough, chest tightness, shortness of breath and wheezing.   Cardiovascular: Negative for chest pain, palpitations and leg swelling.   Neurological: Negative for dizziness, seizures, syncope, weakness, light-headedness and headaches.       Objective:   Physical Exam  Constitutional: She appears well-developed and well-nourished.  Eyes: Pupils are equal, round, and reactive to light.  Neck: Neck supple. No JVD present. No thyromegaly present.  Cardiovascular: Normal rate and regular rhythm. Exam reveals no gallop.  Pulmonary/Chest: Effort normal and breath sounds normal. No respiratory distress. She has no wheezes. She has no rales.  Musculoskeletal: She exhibits no edema.  Neurological: She is alert.       Assessment:     #1 type 2 diabetes well-controlled with A1c today 6.1%  #2 hypertension stable on a goal  #3 history of dyslipidemia.  Previous intolerance with Crestor and Lipitor    Plan:     -Continue recent dietary changes and increased exercise -Continue current medications -Flu vaccine given -We discussed pros and cons of statin therapy.  She is reluctant to try another statin at this time.  She will consider follow-up fasting lipids at next visit in 3 months  Eulas Post MD [facility] at [facility]

## 2017-10-22 NOTE — Patient Instructions (Signed)
Fat and Cholesterol Restricted Diet High levels of fat and cholesterol in your blood may lead to various health problems, such as diseases of the heart, blood vessels, gallbladder, liver, and pancreas. Fats are concentrated sources of energy that come in various forms. Certain types of fat, including saturated fat, may be harmful in excess. Cholesterol is a substance needed by your body in small amounts. Your body makes all the cholesterol it needs. Excess cholesterol comes from the food you eat. When you have high levels of cholesterol and saturated fat in your blood, health problems can develop because the excess fat and cholesterol will gather along the walls of your blood vessels, causing them to narrow. Choosing the right foods will help you control your intake of fat and cholesterol. This will help keep the levels of these substances in your blood within normal limits and reduce your risk of disease. What is my plan? Your health care provider recommends that you:  Limit your fat intake to ______% or less of your total calories per day.  Limit the amount of cholesterol in your diet to less than _________mg per day.  Eat 20-30 grams of fiber each day.  What types of fat should I choose?  Choose healthy fats more often. Choose monounsaturated and polyunsaturated fats, such as olive and canola oil, flaxseeds, walnuts, almonds, and seeds.  Eat more omega-3 fats. Good choices include salmon, mackerel, sardines, tuna, flaxseed oil, and ground flaxseeds. Aim to eat fish at least two times a week.  Limit saturated fats. Saturated fats are primarily found in animal products, such as meats, butter, and cream. Plant sources of saturated fats include palm oil, palm kernel oil, and coconut oil.  Avoid foods with partially hydrogenated oils in them. These contain trans fats. Examples of foods that contain trans fats are stick margarine, some tub margarines, cookies, crackers, and other baked goods. What  general guidelines do I need to follow? These guidelines for healthy eating will help you control your intake of fat and cholesterol:  Check food labels carefully to identify foods with trans fats or high amounts of saturated fat.  Fill one half of your plate with vegetables and green salads.  Fill one fourth of your plate with whole grains. Look for the word "whole" as the first word in the ingredient list.  Fill one fourth of your plate with lean protein foods.  Limit fruit to two servings a day. Choose fruit instead of juice.  Eat more foods that contain fiber, such as apples, broccoli, carrots, beans, peas, and barley.  Eat more home-cooked food and less restaurant, buffet, and fast food.  Limit or avoid alcohol.  Limit foods high in starch and sugar.  Limit fried foods.  Cook foods using methods other than frying. Baking, boiling, grilling, and broiling are all great options.  Lose weight if you are overweight. Losing just 5-10% of your initial body weight can help your overall health and prevent diseases such as diabetes and heart disease.  What foods can I eat? Grains  Whole grains, such as whole wheat or whole grain breads, crackers, cereals, and pasta. Unsweetened oatmeal, bulgur, barley, quinoa, or brown rice. Corn or whole wheat flour tortillas. Vegetables  Fresh or frozen vegetables (raw, steamed, roasted, or grilled). Green salads. Fruits  All fresh, canned (in natural juice), or frozen fruits. Meats and other protein foods  Ground beef (85% or leaner), grass-fed beef, or beef trimmed of fat. Skinless chicken or turkey. Ground chicken or turkey.   Pork trimmed of fat. All fish and seafood. Eggs. Dried beans, peas, or lentils. Unsalted nuts or seeds. Unsalted canned or dry beans. Dairy  Low-fat dairy products, such as skim or 1% milk, 2% or reduced-fat cheeses, low-fat ricotta or cottage cheese, or plain low-fat yo Fats and oils  Tub margarines without trans  fats. Light or reduced-fat mayonnaise and salad dressings. Avocado. Olive, canola, sesame, or safflower oils. Natural peanut or almond butter (choose ones without added sugar and oil). The items listed above may not be a complete list of recommended foods or beverages. Contact your dietitian for more options. Foods to avoid Grains  White bread. White pasta. White rice. Cornbread. Bagels, pastries, and croissants. Crackers that contain trans fat. Vegetables  White potatoes. Corn. Creamed or fried vegetables. Vegetables in a cheese sauce. Fruits  Dried fruits. Canned fruit in light or heavy syrup. Fruit juice. Meats and other protein foods  Fatty cuts of meat. Ribs, chicken wings, bacon, sausage, bologna, salami, chitterlings, fatback, hot dogs, bratwurst, and packaged luncheon meats. Liver and organ meats. Dairy  Whole or 2% milk, cream, half-and-half, and cream cheese. Whole milk cheeses. Whole-fat or sweetened yogurt. Full-fat cheeses. Nondairy creamers and whipped toppings. Processed cheese, cheese spreads, or cheese curds. Beverages  Alcohol. Sweetened drinks (such as sodas, lemonade, and fruit drinks or punches). Fats and oils  Butter, stick margarine, lard, shortening, ghee, or bacon fat. Coconut, palm kernel, or palm oils. Sweets and desserts  Corn syrup, sugars, honey, and molasses. Candy. Jam and jelly. Syrup. Sweetened cereals. Cookies, pies, cakes, donuts, muffins, and ice cream. The items listed above may not be a complete list of foods and beverages to avoid. Contact your dietitian for more information. This information is not intended to replace advice given to you by your health care provider. Make sure you discuss any questions you have with your health care provider. Document Released: 01/08/2005 Document Revised: 01/29/2014 Document Reviewed: 04/08/2013 Elsevier Interactive Patient Education  2018 Elsevier Inc.  

## 2017-10-22 NOTE — Addendum Note (Signed)
Addended by: Anibal Henderson on: 10/22/2017 12:54 PM   Modules accepted: Orders

## 2017-11-04 ENCOUNTER — Ambulatory Visit (INDEPENDENT_AMBULATORY_CARE_PROVIDER_SITE_OTHER): Payer: Self-pay

## 2017-11-04 ENCOUNTER — Ambulatory Visit (INDEPENDENT_AMBULATORY_CARE_PROVIDER_SITE_OTHER): Payer: Medicare Other | Admitting: Orthopedic Surgery

## 2017-11-04 ENCOUNTER — Encounter (INDEPENDENT_AMBULATORY_CARE_PROVIDER_SITE_OTHER): Payer: Self-pay | Admitting: Orthopedic Surgery

## 2017-11-04 DIAGNOSIS — M25561 Pain in right knee: Secondary | ICD-10-CM | POA: Diagnosis not present

## 2017-11-04 DIAGNOSIS — M79672 Pain in left foot: Secondary | ICD-10-CM | POA: Diagnosis not present

## 2017-11-06 ENCOUNTER — Encounter (INDEPENDENT_AMBULATORY_CARE_PROVIDER_SITE_OTHER): Payer: Self-pay | Admitting: Orthopedic Surgery

## 2017-11-06 NOTE — Progress Notes (Signed)
Office Visit Note   Patient: Dawn Salas           Date of Birth: 08-31-1940           MRN: [id number] Visit Date: 11/04/2017 Requested by: Eulas Post, MD [place], [place] [address] PCP: Eulas Post, MD  Subjective: Chief Complaint  Patient presents with  . Right Knee - Pain  . Left Foot - Pain    HPI: Dawn Salas is a patient with left foot pain and right knee pain.  She is had 3 arthroscopies on that right knee and has a known history of end-stage right knee arthritis.  She may consider knee replacement at some time in the future.  Last surgery was 2012.  The patient also has diabetes.  She had a history of left foot bunion surgery and toe metatarsal correction in 2013.  That was done by Dr. Sharol Given.  She also has diabetes.  She reports left foot pain and swelling over the past several weeks with no history of injury.  No fevers or chills but she does reports new swelling in that left MTP region.              ROS: All systems reviewed are negative as they relate to the chief complaint within the history of present illness.  Patient denies  fevers or chills.   Assessment & Plan: Visit Diagnoses:  1. Pain in left foot   2. Right knee pain, unspecified chronicity     Plan: Impression is left foot pain possible occult infection around that surgery site.  She does have some swelling and a little warmth there but no palpable fluctuance.  Radiographs are normal.  She needs MRI with contrast to evaluate left foot for infection.  There is some swelling and atypical soft tissue prominence around the proximal aspect of that first and second metatarsal base.  In regards to the knee think she needs knee replacement at some time when she is ready.  She will hold off on that until this foot issue is resolved.  Follow-Up Instructions: Return for after MRI.   Orders:  Orders Placed This Encounter  Procedures  . XR Foot Complete Left  . XR KNEE 3 VIEW RIGHT  . MR  FOOT LEFT W WO CONTRAST   No orders of the defined types were placed in this encounter.     Procedures: No procedures performed   Clinical Data: No additional findings.  Objective: Vital Signs: There were no vitals taken for this visit.  Physical Exam:   Constitutional: Patient appears well-developed HEENT:  Head: Normocephalic Eyes:EOM are normal Neck: Normal range of motion Cardiovascular: Normal rate Pulmonary/chest: Effort normal Neurologic: Patient is alert Skin: Skin is warm Psychiatric: Patient has normal mood and affect    Ortho Exam: Ortho exam demonstrates palpable pedal pulses in both feet.  She does have soft tissue prominence between the first and second metatarsal proximally.  No fluctuance is present but there is swelling and some warmth around the plantar aspect of the first MTP joint.  No ulceration or calluses is noted.  Sensation predictably diminished in the foot consistent with diabetic neuropathy.  Patient has palpable intact nontender anterior to posterior to peroneal and Achilles tendons in that left foot.  Right knee demonstrates no effusion flexion contracture of about 10 degrees flexion past 90 with intact extensor mechanism and joint line tenderness is present.  No groin pain with internal/external rotation of that right leg.  Specialty Comments:  No specialty comments available.  Imaging: No results found.   PMFS History: Patient Active Problem List   Diagnosis Date Noted  . CKD (chronic kidney disease) stage 3, GFR 30-59 ml/min ([facility]CC) 10/01/2017  . History of normocytic normochromic anemia 11/25/2015  . Spondylolisthesis of lumbar region 12/23/2013  . 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 Hyperthyroidism 05/25/2013  . Obesity (BMI 30-39.9) 12/30/2012  . 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[place]) 08/21/2011  . Meningioma ([place]) 08/13/2011  . DIZZINESS 11/02/2009  . Type 2 diabetes mellitus, controlled ([place]) 05/20/2009  . HYPOKALEMIA  05/02/2009  . DEPRESSION 06/24/2008  . GERD 06/24/2008  . PEPTIC ULCER DISEASE 06/24/2008  . TMJ SYNDROME 06/09/2008  . Hyperlipemia 11/08/2006  . OBSTRUCTIVE SLEEP APNEA 11/08/2006  . Essential hypertension 11/08/2006  . ALLERGIC RHINITIS 11/08/2006  . VOCAL CORD DISORDER 11/08/2006  . ASTHMA 11/08/2006  . TACHYARRHYTHMIA 11/08/2006   Past Medical History:  Diagnosis Date  . Allergy   . Anxiety   . Arthritis   . Asthma    patient denies   . Depression   . Diabetes mellitus    Type two  . Diverticulitis   . Family history of adverse reaction to anesthesia    daughter- N/V   . Frequency of urination   . GERD (gastroesophageal reflux disease)   . History of hiatal hernia   . Hyperglycemia   . Hyperlipidemia   . Hypertension   . Hyperthyroidism    PT HAD BIPOSY -NEGATIVE.Marland Kitchen AND NOW JUST GETS CHECKED EACH YEAR .Marland Kitchen 2013 LAST TEST   SEES DR. [name].   . Multiple meningiomas of spine and brain ([place])   . Obstructive sleep apnea    STUDY AT [facility] DOES NOT USE C PAP  . Palpitations   . Peptic ulcer disease   . Pneumonia    "walking pneumonia" hx of  . Seizures ([place])    one siezure with brain surgery none since 2015     Family History  Problem Relation Age of Onset  . Cancer Mother        ovarian  . Heart disease Father 75  . Arthritis Other   . Hyperlipidemia Other   . Hypertension Other   . Diabetes Other   . Anesthesia problems Daughter        NAUSEA AND VOMITING POST OP  . Breast cancer Neg Hx     Past Surgical History:  Procedure Laterality Date  . ABDOMINAL HYSTERECTOMY    . BACK SURGERY     L SPIN 2003   NECK 2004  . BRAIN MENINGIOMA EXCISION  8/10   X 2  . C5-C6 neck fusion    . COLONOSCOPY    . CRANIOTOMY  08/13/2011   Procedure: CRANIOTOMY TUMOR EXCISION;  Surgeon: [name], MD;  Location: [facility];  Service: Neurosurgery;  Laterality: Bilateral;  Bifrontal craniotomy for tumor  . DILATION AND CURETTAGE OF UTERUS     YEARS AGO  . L4-L5  posterior la    . left foot plantar and hammertoe    . right foot bunectomy    . right knee arthroscopy     x 3  . right shoulder rotator cuff    . ROBOTIC ASSISTED BILATERAL SALPINGO OOPHERECTOMY N/A 08/25/2015   Procedure: XI ROBOTIC ASSISTED BILATERAL SALPINGO OOPHORECTOMY;  Surgeon: [name], MD;  Location: [facility];  Service: Gynecology;  Laterality: N/A;  . TONSILLECTOMY    . TUBAL LIGATION    . WRIST SURGERY Right  Social History   Occupational History  . Not on file  Tobacco Use  . Smoking status: Never Smoker  . Smokeless tobacco: Never Used  Substance and Sexual Activity  . Alcohol use: No  . Drug use: No  . Sexual activity: Never

## 2017-11-07 ENCOUNTER — Other Ambulatory Visit: Payer: Self-pay | Admitting: Family Medicine

## 2017-11-18 ENCOUNTER — Ambulatory Visit
Admission: RE | Admit: 2017-11-18 | Discharge: 2017-11-18 | Disposition: A | Discharge status: Home / Self Care | Payer: Medicare Other | Source: Ambulatory Visit | Attending: Orthopedic Surgery | Admitting: Orthopedic Surgery

## 2017-11-18 DIAGNOSIS — M79672 Pain in left foot: Secondary | ICD-10-CM

## 2017-11-18 MED ORDER — GADOBENATE DIMEGLUMINE 529 MG/ML IV SOLN
20.0000 mL | Freq: Once | INTRAVENOUS | Status: AC | PRN
  Administered 2017-11-18: 20 mL via INTRAVENOUS

## 2017-11-22 ENCOUNTER — Encounter (INDEPENDENT_AMBULATORY_CARE_PROVIDER_SITE_OTHER): Payer: Self-pay | Admitting: Orthopedic Surgery

## 2017-11-22 ENCOUNTER — Ambulatory Visit (INDEPENDENT_AMBULATORY_CARE_PROVIDER_SITE_OTHER): Payer: Medicare Other | Admitting: Orthopedic Surgery

## 2017-11-22 DIAGNOSIS — M79672 Pain in left foot: Secondary | ICD-10-CM

## 2017-11-22 NOTE — Progress Notes (Signed)
Office Visit Note   Patient: Dawn Salas           Date of Birth: [birth year]           MRN: [id number] Visit Date: 11/22/2017 Requested by: Eulas Post, MD [place], [place] [address] PCP: Eulas Post, MD  Subjective: Chief Complaint  Patient presents with  . Left Foot - Follow-up    HPI: Dawn Salas is a patient with left foot pain.  She mistakenly thought that I did her left foot and toe surgery 4 years ago.  She had some edema in the forefoot and with a diagnosis of diabetes was concerned that she had an infection.  Since of seen an MRI scan is been done which shows edema but no clear-cut infection.  She has had prior hallux valgus construction as well as lesser toe surgery.  She is not having any fevers or chills.              ROS: All systems reviewed are negative as they relate to the chief complaint within the history of present illness.  Patient denies  fevers or chills.   Assessment & Plan: Visit Diagnoses:  1. Pain in left foot     Plan: Impression is left foot pain and swelling with some edema but no obvious infection in the foot.  Plan is for her to try 1 of these via IV E socks and follow-up with Dr. Sharol Given in 6 weeks for further management  Follow-Up Instructions: No follow-ups on file.   Orders:  No orders of the defined types were placed in this encounter.  No orders of the defined types were placed in this encounter.     Procedures: No procedures performed   Clinical Data: No additional findings.  Objective: Vital Signs: There were no vitals taken for this visit.  Physical Exam:   Constitutional: Patient appears well-developed HEENT:  Head: Normocephalic Eyes:EOM are normal Neck: Normal range of motion Cardiovascular: Normal rate Pulmonary/chest: Effort normal Neurologic: Patient is alert Skin: Skin is warm Psychiatric: Patient has normal mood and affect    Ortho Exam: Ortho exam demonstrates full active and  passive range of motion of the ankle.  There is some dorsal swelling but no fluctuance crepitus or cellulitis or induration.  She has palpable pedal pulses and 2 well-healed surgical incision on the dorsal aspect of the foot distally.  Slight recurrence of hallux valgus is noted.  Ankle range of motion is intact along with forefoot pronation supination  Specialty Comments:  No specialty comments available.  Imaging: No results found.   PMFS History: Patient Active Problem List   Diagnosis Date Noted  . CKD (chronic kidney disease) stage 3, GFR 30-59 ml/min (HCC) 10/01/2017  . History of normocytic normochromic anemia 11/25/2015  . Spondylolisthesis of lumbar region 12/23/2013  . 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 Hyperthyroidism 05/25/2013  . Obesity (BMI 30-39.9) 12/30/2012  . 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[place]) 08/21/2011  . Meningioma ([place]) 08/13/2011  . DIZZINESS 11/02/2009  . Type 2 diabetes mellitus, controlled ([place]) 05/20/2009  . HYPOKALEMIA 05/02/2009  . DEPRESSION 06/24/2008  . GERD 06/24/2008  . PEPTIC ULCER DISEASE 06/24/2008  . TMJ SYNDROME 06/09/2008  . Hyperlipemia 11/08/2006  . OBSTRUCTIVE SLEEP APNEA 11/08/2006  . Essential hypertension 11/08/2006  . ALLERGIC RHINITIS 11/08/2006  . VOCAL CORD DISORDER 11/08/2006  . ASTHMA 11/08/2006  . TACHYARRHYTHMIA 11/08/2006   Past Medical History:  Diagnosis Date  . Allergy   .  Anxiety   . Arthritis   . Asthma    patient denies   . Depression   . Diabetes mellitus    Type two  . Diverticulitis   . Family history of adverse reaction to anesthesia    daughter- N/V   . Frequency of urination   . GERD (gastroesophageal reflux disease)   . History of hiatal hernia   . Hyperglycemia   . Hyperlipidemia   . Hypertension   . Hyperthyroidism    PT HAD BIPOSY -NEGATIVE.Marland Kitchen AND NOW JUST GETS CHECKED EACH YEAR .Marland Kitchen 2013 LAST TEST   SEES DR. [name].   . Multiple meningiomas of spine and brain ([place])   . Obstructive  sleep apnea    STUDY AT [facility] DOES NOT USE C PAP  . Palpitations   . Peptic ulcer disease   . Pneumonia    "walking pneumonia" hx of  . Seizures ([place])    one siezure with brain surgery none since 2015     Family History  Problem Relation Age of Onset  . Cancer Mother        ovarian  . Heart disease Father 70  . Arthritis Other   . Hyperlipidemia Other   . Hypertension Other   . Diabetes Other   . Anesthesia problems Daughter        NAUSEA AND VOMITING POST OP  . Breast cancer Neg Hx     Past Surgical History:  Procedure Laterality Date  . ABDOMINAL HYSTERECTOMY    . BACK SURGERY     L SPIN 2003   NECK 2004  . BRAIN MENINGIOMA EXCISION  8/10   X 2  . C5-C6 neck fusion    . COLONOSCOPY    . CRANIOTOMY  08/13/2011   Procedure: CRANIOTOMY TUMOR EXCISION;  Surgeon: [name], MD;  Location: [facility];  Service: Neurosurgery;  Laterality: Bilateral;  Bifrontal craniotomy for tumor  . DILATION AND CURETTAGE OF UTERUS     YEARS AGO  . L4-L5 posterior la    . left foot plantar and hammertoe    . right foot bunectomy    . right knee arthroscopy     x 3  . right shoulder rotator cuff    . ROBOTIC ASSISTED BILATERAL SALPINGO OOPHERECTOMY N/A 08/25/2015   Procedure: XI ROBOTIC ASSISTED BILATERAL SALPINGO OOPHORECTOMY;  Surgeon: [name], MD;  Location: [facility];  Service: Gynecology;  Laterality: N/A;  . TONSILLECTOMY    . TUBAL LIGATION    . WRIST SURGERY Right    Social History   Occupational History  . Not on file  Tobacco Use  . Smoking status: Never Smoker  . Smokeless tobacco: Never Used  Substance and Sexual Activity  . Alcohol use: No  . Drug use: No  . Sexual activity: Never

## 2017-11-30 ENCOUNTER — Other Ambulatory Visit: Payer: Self-pay | Admitting: Family Medicine

## 2018-01-01 ENCOUNTER — Ambulatory Visit (INDEPENDENT_AMBULATORY_CARE_PROVIDER_SITE_OTHER): Payer: Medicare Other | Admitting: Family

## 2018-01-01 ENCOUNTER — Encounter (INDEPENDENT_AMBULATORY_CARE_PROVIDER_SITE_OTHER): Payer: Self-pay | Admitting: Family

## 2018-01-01 VITALS — Ht 64.0 in | Wt 222.2 lb

## 2018-01-01 DIAGNOSIS — I872 Venous insufficiency (chronic) (peripheral): Secondary | ICD-10-CM | POA: Diagnosis not present

## 2018-01-01 DIAGNOSIS — R600 Localized edema: Secondary | ICD-10-CM | POA: Diagnosis not present

## 2018-01-01 DIAGNOSIS — E1122 Type 2 diabetes mellitus with diabetic chronic kidney disease: Secondary | ICD-10-CM | POA: Diagnosis not present

## 2018-01-01 DIAGNOSIS — N181 Chronic kidney disease, stage 1: Secondary | ICD-10-CM | POA: Diagnosis not present

## 2018-01-01 NOTE — Progress Notes (Signed)
Office Visit Note   Patient: Dawn Salas           Date of Birth: July 13, 1940           MRN: [id number] Visit Date: 01/01/2018 Requested by: Eulas Post, MD [place], [place] [address] PCP: Eulas Post, MD  Subjective: Chief Complaint  Patient presents with  . Left Foot - Pain    Referred by Marlou Sa    HPI: Dawn Salas is a 77 year old woman who presents today in follow-up for burning pain and swelling to her left foot and lower extremity.  She is status post bunion repair as well as hammertoe repair on the left for years ago.  Was last seen by Dr. Marlou Sa the first week of November for the same.  Describes pain as over top of her foot this radiates up her leg has deep aching pain from time to time in her leg has had chronic swelling for quite some time.  Some darkened discoloration to her anterior shin and top of her foot as well she is concerned about this.  As she has diabetes she is concerned for infection in her feet. Has been in compression stockings since visit with Dr. Marlou Sa.  Notes that when she wears these her burning pain and swelling as well as itching in her lower extremity are much improved.  She is not having any fevers or chills.  MRI of her foot was reassuring.              ROS: All systems reviewed are negative as they relate to the chief complaint within the history of present illness.  Patient denies  fevers or chills.   Assessment & Plan: Visit Diagnoses:  1. Edema of left foot   2. Venous insufficiency of left leg   3. Controlled type 2 diabetes mellitus with stage 1 chronic kidney disease, without long-term current use of insulin ([place])     Plan: Advised we will continue with the compression stockings around-the-clock.  Discussed may wear these for the rest of her life.  Has recently had studies of her lower extremities show adequate blood flow.  No current ulcerations no concern for infection.  Discussed possibility of using gabapentin or  Neurontin for neuropathic pain patient declined today.  Discussed she may follow-up as needed in about 6 weeks with Dr. Sharol Given.  Follow-Up Instructions: Return in about 6 weeks (around 02/12/2018), or if symptoms worsen or fail to improve.   Orders:  No orders of the defined types were placed in this encounter.  No orders of the defined types were placed in this encounter.     Procedures: No procedures performed   Clinical Data: No additional findings.  Objective: Vital Signs: Ht 5\' 4"  (1.626 m)   Wt 222 lb 3.2 oz (100.8 kg)   BMI 38.14 kg/m   Physical Exam:   Constitutional: Patient appears well-developed HEENT:  Head: Normocephalic Eyes:EOM are normal Neck: Normal range of motion Cardiovascular: Normal rate Pulmonary/chest: Effort normal Neurologic: Patient is alert Skin: Skin is warm Psychiatric: Patient has normal mood and affect    Ortho Exam: Ortho exam demonstrates full active and passive range of motion of the ankle.  There is some dorsal swelling but no fluctuance crepitus or cellulitis or induration. Mild swelling to foot and ankle with hemosiderin staining.  She has palpable pedal pulses and 2 well-healed surgical incision on the dorsal aspect of the foot distally.  Slight recurrence of hallux valgus is  noted.  Ankle range of motion is intact along with forefoot pronation supination  Specialty Comments:  No specialty comments available.  Imaging: No results found.   PMFS History: Patient Active Problem List   Diagnosis Date Noted  . CKD (chronic kidney disease) stage 3, GFR 30-59 ml/min ([facility]) 10/01/2017  . History of normocytic normochromic anemia 11/25/2015  . Spondylolisthesis of lumbar region 12/23/2013  . 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 Hyperthyroidism 05/25/2013  . Obesity (BMI 30-39.9) 12/30/2012  . 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[place]) 08/21/2011  . Meningioma ([place]) 08/13/2011  . DIZZINESS 11/02/2009  . Type 2 diabetes mellitus,  controlled ([place]) 05/20/2009  . HYPOKALEMIA 05/02/2009  . DEPRESSION 06/24/2008  . GERD 06/24/2008  . PEPTIC ULCER DISEASE 06/24/2008  . TMJ SYNDROME 06/09/2008  . Hyperlipemia 11/08/2006  . OBSTRUCTIVE SLEEP APNEA 11/08/2006  . Essential hypertension 11/08/2006  . ALLERGIC RHINITIS 11/08/2006  . VOCAL CORD DISORDER 11/08/2006  . ASTHMA 11/08/2006  . TACHYARRHYTHMIA 11/08/2006   Past Medical History:  Diagnosis Date  . Allergy   . Anxiety   . Arthritis   . Asthma    patient denies   . Depression   . Diabetes mellitus    Type two  . Diverticulitis   . Family history of adverse reaction to anesthesia    daughter- N/V   . Frequency of urination   . GERD (gastroesophageal reflux disease)   . History of hiatal hernia   . Hyperglycemia   . Hyperlipidemia   . Hypertension   . Hyperthyroidism    PT HAD BIPOSY -NEGATIVE.Marland Kitchen AND NOW JUST GETS CHECKED EACH YEAR .Marland Kitchen 2013 LAST TEST   SEES DR. [name].   . Multiple meningiomas of spine and brain ([place])   . Obstructive sleep apnea    [facility]Y AT [facility] DOES NOT USE C PAP  . Palpitations   . Peptic ulcer disease   . Pneumonia    "walking pneumonia" hx of  . Seizures ([place])    one siezure with brain surgery none since 2015     Family History  Problem Relation Age of Onset  . Cancer Mother        ovarian  . Heart disease Father 19  . Arthritis Other   . Hyperlipidemia Other   . Hypertension Other   . Diabetes Other   . Anesthesia problems Daughter        NAUSEA AND VOMITING POST OP  . Breast cancer Neg Hx     Past Surgical History:  Procedure Laterality Date  . ABDOMINAL HYSTERECTOMY    . BACK SURGERY     L SPIN 2003   NECK 2004  . BRAIN MENINGIOMA EXCISION  8/10   X 2  . C5-C6 neck fusion    . COLONOSCOPY    . CRANIOTOMY  08/13/2011   Procedure: CRANIOTOMY TUMOR EXCISION;  Surgeon: [name], MD;  Location: [facility];  Service: Neurosurgery;  Laterality: Bilateral;  Bifrontal craniotomy for tumor  . DILATION AND  CURETTAGE OF UTERUS     YEARS AGO  . L4-L5 posterior la    . left foot plantar and hammertoe    . right foot bunectomy    . right knee arthroscopy     x 3  . right shoulder rotator cuff    . ROBOTIC ASSISTED BILATERAL SALPINGO OOPHERECTOMY N/A 08/25/2015   Procedure: XI ROBOTIC ASSISTED BILATERAL SALPINGO OOPHORECTOMY;  Surgeon: [name], MD;  Location: [facility]S;  Service: Gynecology;  Laterality: N/A;  .  TONSILLECTOMY    . TUBAL LIGATION    . WRIST SURGERY Right    Social History   Occupational History  . Not on file  Tobacco Use  . Smoking status: Never Smoker  . Smokeless tobacco: Never Used  Substance and Sexual Activity  . Alcohol use: No  . Drug use: No  . Sexual activity: Never

## 2018-01-05 ENCOUNTER — Other Ambulatory Visit: Payer: Self-pay | Admitting: Family Medicine

## 2018-01-17 ENCOUNTER — Ambulatory Visit (INDEPENDENT_AMBULATORY_CARE_PROVIDER_SITE_OTHER): Payer: Medicare Other | Admitting: Endocrinology

## 2018-01-17 ENCOUNTER — Encounter: Payer: Self-pay | Admitting: Endocrinology

## 2018-01-17 VITALS — BP 124/80 | HR 86 | Ht 64.0 in | Wt 224.0 lb

## 2018-01-17 DIAGNOSIS — E7800 Pure hypercholesterolemia, unspecified: Secondary | ICD-10-CM

## 2018-01-17 DIAGNOSIS — E890 Postprocedural hypothyroidism: Secondary | ICD-10-CM

## 2018-01-17 DIAGNOSIS — R413 Other amnesia: Secondary | ICD-10-CM

## 2018-01-17 DIAGNOSIS — E669 Obesity, unspecified: Secondary | ICD-10-CM

## 2018-01-17 DIAGNOSIS — E1169 Type 2 diabetes mellitus with other specified complication: Secondary | ICD-10-CM | POA: Diagnosis not present

## 2018-01-17 LAB — CBC WITH DIFFERENTIAL/PLATELET
Basophils Absolute: 0 10*3/uL (ref 0.0–0.1)
Basophils Relative: 0.9 % (ref 0.0–3.0)
Eosinophils Absolute: 0.1 10*3/uL (ref 0.0–0.7)
Eosinophils Relative: 1.7 % (ref 0.0–5.0)
HCT: 37.4 % (ref 36.0–46.0)
Hemoglobin: 12.3 g/dL (ref 12.0–15.0)
Lymphocytes Relative: 22.6 % (ref 12.0–46.0)
Lymphs Abs: 0.9 10*3/uL (ref 0.7–4.0)
MCHC: 32.9 g/dL (ref 30.0–36.0)
MCV: 79.4 fl (ref 78.0–100.0)
Monocytes Absolute: 0.2 10*3/uL (ref 0.1–1.0)
Monocytes Relative: 5.5 % (ref 3.0–12.0)
Neutro Abs: 2.7 10*3/uL (ref 1.4–7.7)
Neutrophils Relative %: 69.3 % (ref 43.0–77.0)
Platelets: 214 10*3/uL (ref 150.0–400.0)
RBC: 4.71 Mil/uL (ref 3.87–5.11)
RDW: 15.2 % (ref 11.5–15.5)
WBC: 3.8 10*3/uL — ABNORMAL LOW (ref 4.0–10.5)

## 2018-01-17 LAB — URINALYSIS, ROUTINE W REFLEX MICROSCOPIC
Bilirubin Urine: NEGATIVE
Hgb urine dipstick: NEGATIVE
Ketones, ur: NEGATIVE
Leukocytes, UA: NEGATIVE
Nitrite: NEGATIVE
Specific Gravity, Urine: 1.02 (ref 1.000–1.030)
Total Protein, Urine: NEGATIVE
Urine Glucose: NEGATIVE
Urobilinogen, UA: 0.2 (ref 0.0–1.0)
WBC, UA: NONE SEEN (ref 0–?)
pH: 6 (ref 5.0–8.0)

## 2018-01-17 LAB — T4, FREE: Free T4: 0.95 ng/dL (ref 0.60–1.60)

## 2018-01-17 LAB — COMPREHENSIVE METABOLIC PANEL
ALT: 13 U/L (ref 0–35)
AST: 20 U/L (ref 0–37)
Albumin: 4.3 g/dL (ref 3.5–5.2)
Alkaline Phosphatase: 82 U/L (ref 39–117)
BUN: 17 mg/dL (ref 6–23)
CO2: 27 mEq/L (ref 19–32)
Calcium: 9.2 mg/dL (ref 8.4–10.5)
Chloride: 103 mEq/L (ref 96–112)
Creatinine, Ser: 1.24 mg/dL — ABNORMAL HIGH (ref 0.40–1.20)
GFR: 54.21 mL/min — ABNORMAL LOW (ref >60.00)
Glucose, Bld: 130 mg/dL — ABNORMAL HIGH (ref 70–99)
Potassium: 3.9 mEq/L (ref 3.5–5.1)
Sodium: 140 mEq/L (ref 135–145)
Total Bilirubin: 0.5 mg/dL (ref 0.2–1.2)
Total Protein: 6.8 g/dL (ref 6.0–8.3)

## 2018-01-17 LAB — LIPID PANEL
Cholesterol: 319 mg/dL — ABNORMAL HIGH (ref 0–200)
HDL: 88.3 mg/dL (ref >39.00)
LDL Cholesterol: 208 mg/dL — ABNORMAL HIGH (ref 0–99)
NonHDL: 230.32
Total CHOL/HDL Ratio: 4
Triglycerides: 112 mg/dL (ref 0.0–149.0)
VLDL: 22.4 mg/dL (ref 0.0–40.0)

## 2018-01-17 LAB — MICROALBUMIN / CREATININE URINE RATIO
Creatinine,U: 151.1 mg/dL
Microalb Creat Ratio: 0.5 mg/g (ref 0.0–30.0)
Microalb, Ur: <0.7 mg/dL (ref 0.0–1.9)

## 2018-01-17 LAB — TSH: TSH: 1.61 u[IU]/mL (ref 0.35–4.50)

## 2018-01-17 LAB — VITAMIN B12: Vitamin B-12: 1494 pg/mL — ABNORMAL HIGH (ref 211–911)

## 2018-01-17 NOTE — Patient Instructions (Addendum)
Metformin at lunch and dinner, not in am

## 2018-01-17 NOTE — Progress Notes (Addendum)
Patient ID: Dawn Salas, female   DOB: Oct 16, 1940, 77 y.o.   MRN: [id number]                                                                                                                  Chief complaint: Endocrinology follow-up   History of Present Illness:   History obtained on initial consultation:  She was diagnosed as having Hyperthyroidism in 2015 when she was having palpitations and was seen to have a goiter. At that time she was having the usual symptoms of fatigue, shakiness, palpitations, heat intolerance and some weight loss No details of this are available She was treated with I-131, unknown dose which was prescribed in March 2015 Apparently she had a large hot nodule on the left, however details of her treatment are not available  Apparently subsequently her hyperthyroidism recurred and methimazole restarted ?  In 4/17 Also the dose was increased up to 7.5 mg in 10/2015  Apparently prior to her being treated for the hot nodule she had a multinodular goiter evaluated with a fine-needle aspiration in 09/2010  Since she was significantly hyperthyroid on her initial exam in February and free T3 was 5.0 and was treated with methimazole Because of persistent hyperthyroidism and very high thyrotropin receptor antibody she was given I-131 treatment with 20.3 mCi on 06/04/16  RECENT history:  She has been HYPOTHYROID as of 7/18 with a low free T4 level  She was also having some fatigue and cold sensitivity as well as the muscle cramps With starting levothyroxine 125 g she felt better, however subsequently TSH was low again she was reduced down to 100 g  Her last visit was in 06/2017 Since then she has not had any change in her energy level and feels fairly good No consistent heat or cold intolerance  Her levothyroxine dose has been gradually reduced in 2018 and she is now taking her 100 g of levothyroxine 6 days a week only  She takes her levothyroxine consistently  in the mornings  Ophthalmopathy:  She is having good success with wearing prism lenses for her persistent diplopia  No prominence of the eyes or dryness, followed by ophthalmologist   Wt Readings from Last 3 Encounters:  01/17/18 224 lb (101.6 kg)  01/01/18 222 lb 3.2 oz (100.8 kg)  10/22/17 222 lb 3.2 oz (100.8 kg)      Thyroid function tests as follows:     Lab Results  Component Value Date   FREET4 0.92 03/21/2017   FREET4 1.03 01/02/2017   FREET4 1.11 11/21/2016   T3FREE 3.3 03/21/2017   T3FREE 3.1 07/06/2016   T3FREE 3.6 05/23/2016   TSH 0.71 07/19/2017   TSH 0.62 03/21/2017   TSH 0.20 (L) 01/02/2017    Lab Results  Component Value Date   THYROTRECAB 87.70 (H) 04/11/2016   DIABETES: See review of systems     Allergies as of 01/17/2018      Reactions   Flagyl [metronidazole] Other (See Comments)   Caused increased  heart rate   Aspirin Effervescent Swelling   Alka-Seltzer gel caps- sore mouth, face swollen, turned hands white   Crestor [rosuvastatin]    Morphine Sulfate Hives   Hallucinations   Penicillins Swelling, Rash   Has patient had a PCN reaction causing immediate rash, facial/tongue/throat swelling, SOB or lightheadedness with hypotension: Yes Has patient had a PCN reaction causing severe rash involving mucus membranes or skin necrosis: No Has patient had a PCN reaction that required hospitalization No Has patient had a PCN reaction occurring within the last 10 years: No If all of the above answers are "NO", then may proceed with Cephalosporin use.      Medication List       Accurate as of January 17, 2018 10:17 AM. Always use your most recent med list.        accu-chek softclix lancets 1 each by Other route 2 (two) times daily. E11.9   ACCU-CHEK SOFTCLIX LANCETS lancets TEST BLOOD SUGAR TWICE DAILY.   amLODipine 5 MG tablet Commonly known as:  NORVASC Take 1 tablet (5 mg total) by mouth daily.   cetirizine 10 MG tablet Commonly  known as:  ZYRTEC Take 10 mg by mouth daily as needed. For allergies   furosemide 40 MG tablet Commonly known as:  LASIX Take 0.5 tablets (20 mg total) by mouth daily as needed. For fluid   GERITOL COMPLETE PO Take 1 tablet by mouth daily.   levothyroxine 100 MCG tablet Commonly known as:  SYNTHROID, LEVOTHROID Take 1 tablet (100 mcg total) by mouth daily. TAKE ONE TABLET BY MOUTH EVERY DAY EXCEPT Sunday.   losartan-hydrochlorothiazide 100-12.5 MG tablet Commonly known as:  HYZAAR TAKE 1 TABLET DAILY   Magnesium 250 MG Tabs Take 250 mg by mouth daily.   metFORMIN 500 MG tablet Commonly known as:  GLUCOPHAGE TAKE ONE TABLET TWICE A DAY WITH FOOD   metoprolol succinate 100 MG 24 hr tablet Commonly known as:  TOPROL-XL TAKE ONE (1) TABLET EACH DAY   potassium chloride 10 MEQ tablet Commonly known as:  K-DUR TAKE ONE (1) TABLET EACH DAY   promethazine 25 MG tablet Commonly known as:  PHENERGAN Take 1 tablet (25 mg total) by mouth every 6 (six) hours as needed for nausea or vomiting.   RABEprazole 20 MG tablet Commonly known as:  ACIPHEX TAKE ONE (1) TABLET EACH DAY   solifenacin 5 MG tablet Commonly known as:  VESICARE TAKE ONE (1) TABLET EACH DAY   valACYclovir 1000 MG tablet Commonly known as:  VALTREX TAKE 2 TABS TWICE DAILY FOR 1 DAY THEN AS NEEDED   VITAMIN B12 PO Take 1 mL by mouth daily. OTC           Past Medical History:  Diagnosis Date  . Allergy   . Anxiety   . Arthritis   . Asthma    patient denies   . Depression   . Diabetes mellitus    Type two  . Diverticulitis   . Family history of adverse reaction to anesthesia    daughter- N/V   . Frequency of urination   . GERD (gastroesophageal reflux disease)   . History of hiatal hernia   . Hyperglycemia   . Hyperlipidemia   . Hypertension   . Hyperthyroidism    PT HAD BIPOSY -NEGATIVE.Marland Kitchen AND NOW JUST GETS CHECKED EACH YEAR .Marland Kitchen 2013 LAST TEST   SEES DR. [name].   . Multiple meningiomas of  spine and brain ([place])   . Obstructive sleep apnea  [facility]Y AT [facility] DOES NOT USE C PAP  . Palpitations   . Peptic ulcer disease   . Pneumonia    "walking pneumonia" hx of  . Seizures ([place])    one siezure with brain surgery none since 2015     Past Surgical History:  Procedure Laterality Date  . ABDOMINAL HYSTERECTOMY    . BACK SURGERY     L SPIN 2003   NECK 2004  . BRAIN MENINGIOMA EXCISION  8/10   X 2  . C5-C6 neck fusion    . COLONOSCOPY    . CRANIOTOMY  08/13/2011   Procedure: CRANIOTOMY TUMOR EXCISION;  Surgeon: [name], MD;  Location: [facility];  Service: Neurosurgery;  Laterality: Bilateral;  Bifrontal craniotomy for tumor  . DILATION AND CURETTAGE OF UTERUS     YEARS AGO  . L4-L5 posterior la    . left foot plantar and hammertoe    . right foot bunectomy    . right knee arthroscopy     x 3  . right shoulder rotator cuff    . ROBOTIC ASSISTED BILATERAL SALPINGO OOPHERECTOMY N/A 08/25/2015   Procedure: XI ROBOTIC ASSISTED BILATERAL SALPINGO OOPHORECTOMY;  Surgeon: [name], MD;  Location: [facility];  Service: Gynecology;  Laterality: N/A;  . TONSILLECTOMY    . TUBAL LIGATION    . WRIST SURGERY Right     Family History  Problem Relation Age of Onset  . Cancer Mother        ovarian  . Heart disease Father 40  . Arthritis Other   . Hyperlipidemia Other   . Hypertension Other   . Diabetes Other   . Anesthesia problems Daughter        NAUSEA AND VOMITING POST OP  . Breast cancer Neg Hx     Social History:  reports that she has never smoked. She has never used smokeless tobacco. She reports that she does not drink alcohol or use drugs.  Allergies:  Allergies  Allergen Reactions  . Flagyl [Metronidazole] Other (See Comments)    Caused increased heart rate  . Aspirin Effervescent Swelling    Alka-Seltzer gel caps- sore mouth, face swollen, turned hands white  . Crestor [Rosuvastatin]   . Morphine Sulfate Hives    Hallucinations  . Penicillins  Swelling and Rash    Has patient had a PCN reaction causing immediate rash, facial/tongue/throat swelling, SOB or lightheadedness with hypotension: Yes Has patient had a PCN reaction causing severe rash involving mucus membranes or skin necrosis: No Has patient had a PCN reaction that required hospitalization No Has patient had a PCN reaction occurring within the last 10 years: No If all of the above answers are "NO", then may proceed with Cephalosporin use.       Review of Systems    DIABETES  Followed by PCP and treated with metformin twice a day Although she was recommended taking her morning metformin at lunchtime she still takes it in the morning with or without any significant amount of food intake at breakfast  At times will feel a little shaky, sweaty or weak usually before lunch and she thinks her blood sugars may be in the 70s at times with this She also may feel symptomatic if blood sugar is below 100 She appears to have gained some weight Not exercising regularly  Blood sugars at home are reviewed from download and are averaging 109 Blood sugars range 78-137 with most readings before meals and only once at  8 PM   Lab Results  Component Value Date   HGBA1C 6.1 (A) 10/22/2017   HGBA1C 6.6 (H) 07/19/2017   HGBA1C 6.3 04/02/2017   Lab Results  Component Value Date   LDLCALC 155 (H) 04/02/2016   CREATININE 1.12 10/01/2017      Examination:   BP 124/80 (BP Location: Left Arm, Patient Position: Sitting, Cuff Size: Normal)   Pulse 86   Ht 5\' 4"  (1.626 m)   Wt 224 lb (101.6 kg)   SpO2 98%   BMI 38.45 kg/m   Thyroid not palpable Skin appears normal  Biceps reflex on the right biceps is normal    Assessment/Plan:  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-131  She is taking 100 g levothyroxine only 6 days a week, the dose has been stable She has occasional fatigue which is unlikely to be related to her thyroid She has been compliant with  her supplement   Ophthalmopathy: She has been treated for her diplopia with prism lenses by ophthalmologist  DIABETES with obesity: This is well controlled on low-dose metformin  This is followed by her PCP with last A1c 6.1 However she has gained weight Again she is reporting hypoglycemic type symptoms when her blood sugars are below 100 but mostly after her morning metformin  Recommendations:  Check thyroid levels again today and decide on dosage  Follow-up in 6 months if labs are normal  She can try taking metformin at lunch and bedtime She does need to try and work on her weight loss with more exercise and consistent diet  She will continue to follow-up with PCP for diabetes  HYPERTENSION: Controlled but she can discuss using clonidine instead of amlodipine for blood pressure as well as her hot flashes and sweating episodes  Labs to be done including lipids which are overdue and will forward to PCP  There are no Patient Instructions on file for this visit.  Elayne Snare 01/17/2018, 10:17 AM

## 2018-01-24 ENCOUNTER — Ambulatory Visit (INDEPENDENT_AMBULATORY_CARE_PROVIDER_SITE_OTHER): Payer: 59 | Admitting: Family Medicine

## 2018-01-24 ENCOUNTER — Encounter: Payer: Self-pay | Admitting: Family Medicine

## 2018-01-24 ENCOUNTER — Other Ambulatory Visit: Payer: Self-pay

## 2018-01-24 VITALS — BP 126/82 | HR 80 | Temp 98.0°F | Ht 64.0 in | Wt 220.3 lb

## 2018-01-24 DIAGNOSIS — N181 Chronic kidney disease, stage 1: Secondary | ICD-10-CM

## 2018-01-24 DIAGNOSIS — E1122 Type 2 diabetes mellitus with diabetic chronic kidney disease: Secondary | ICD-10-CM

## 2018-01-24 DIAGNOSIS — E7800 Pure hypercholesterolemia, unspecified: Secondary | ICD-10-CM

## 2018-01-24 DIAGNOSIS — L84 Corns and callosities: Secondary | ICD-10-CM | POA: Diagnosis not present

## 2018-01-24 LAB — POCT GLYCOSYLATED HEMOGLOBIN (HGB A1C): Hemoglobin A1C: 6 % — AB (ref 4.0–5.6)

## 2018-01-24 MED ORDER — PITAVASTATIN CALCIUM 2 MG PO TABS: 2.0000 mg | ORAL_TABLET | Freq: Every day | ORAL | 5 refills | Status: DC

## 2018-01-24 NOTE — Addendum Note (Signed)
Addended by: Anibal Henderson on: 01/24/2018 09:18 AM   Modules accepted: Orders

## 2018-01-24 NOTE — Patient Instructions (Signed)
Preventing High Cholesterol  Cholesterol is a waxy, fat-like substance that your body needs in small amounts. Your liver makes all the cholesterol that your body needs. Having high cholesterol (hypercholesterolemia) increases your risk for heart disease and stroke. Extra (excess) cholesterol comes from the food you eat, such as animal-based fat (saturated fat) from meat and some dairy products.  High cholesterol can often be prevented with diet and lifestyle changes. If you already have high cholesterol, you can control it with diet and lifestyle changes, as well as medicine.  What nutrition changes can be made?   Eat less saturated fat. Foods that contain saturated fat include red meat and some dairy products.   Avoid processed meats, like bacon and lunch meats.   Avoid trans fats, which are found in margarine and some baked goods.   Avoid foods and beverages that have added sugars.   Eat more fruits, vegetables, and whole grains.   Choose healthy sources of protein, such as fish, poultry, and nuts.   Choose healthy sources of fat, such as:  ? Nuts.  ? Vegetable oils, especially olive oil.  ? Fish that have healthy fats (omega-3 fatty acids), such as mackerel or salmon.  What lifestyle changes can be made?     Lose weight if you are overweight. Losing 5-10 lb (2.3-4.5 kg) can help prevent or control high cholesterol and reduce your risk for diabetes and high blood pressure. Ask your health care provider to help you with a diet and exercise plan to safely lose weight.   Get enough exercise. Do at least 150 minutes of moderate-intensity exercise each week.  ? You could do this in short exercise sessions several times a day, or you could do longer exercise sessions a few times a week. For example, you could take a brisk 10-minute walk or bike ride, 3 times a day, for 5 days a week.   Do not smoke. If you need help quitting, ask your health care provider.   Limit your alcohol intake. If you drink  alcohol, limit alcohol intake to no more than 1 drink a day for nonpregnant women and 2 drinks a day for men. One drink equals 12 oz of beer, 5 oz of wine, or 1 oz of hard liquor.  Why are these changes important?    If you have high cholesterol, deposits (plaques) may build up on the walls of your blood vessels. Plaques make the arteries narrower and stiffer, which can restrict or block blood flow and cause blood clots to form. This greatly increases your risk for heart attack and stroke. Making diet and lifestyle changes can reduce your risk for these life-threatening conditions.  What can I do to lower my risk?   Manage your risk factors for high cholesterol. Talk with your health care provider about all of your risk factors and how to lower your risk.   Manage other conditions that you have, such as diabetes or high blood pressure (hypertension).   Have your cholesterol checked at regular intervals.   Keep all follow-up visits as told by your health care provider. This is important.  How is this treated?  In addition to diet and lifestyle changes, your health care provider may recommend medicines to help lower cholesterol, such as a medicine to reduce the amount of cholesterol made in your liver. You may need medicine if:   Diet and lifestyle changes do not lower your cholesterol enough.   You have high cholesterol and other risk factors   for heart disease or stroke.  Take over-the-counter and prescription medicines only as told by your health care provider.  Where to find more information   American Heart Association: www.heart.org/HEARTORG/Conditions/Cholesterol/Cholesterol_UCM_001089_SubHomePage.jsp   National Heart, Lung, and Blood Institute: www.nhlbi.nih.gov/health/resources/heart/heart-cholesterol-hbc-what-html  Summary   High cholesterol increases your risk for heart disease and stroke. By keeping your cholesterol level low, you can reduce your risk for these conditions.   Diet and lifestyle  changes are the most important steps in preventing high cholesterol.   Work with your health care provider to manage your risk factors, and have your blood tested regularly.  This information is not intended to replace advice given to you by your health care provider. Make sure you discuss any questions you have with your health care provider.  Document Released: 01/23/2015 Document Revised: 09/17/2015 Document Reviewed: 09/17/2015  Elsevier Interactive Patient Education  2019 [employer]

## 2018-01-24 NOTE — Progress Notes (Signed)
Subjective:     Patient ID: Dawn Salas, female   DOB: April 24, 1940, 78 y.o.   MRN: [id number]  HPI Patient is here for the following items  Recently saw endocrinologist.  She had lipids done and cholesterol 319 with LDL 208 and HDL of 88.  Patient states she previously took Lipitor but had headache.  She took Crestor but cannot recall but has some type of side effect.  She is not aware of trying any other statins.  No history of CAD or known peripheral vascular disease.  Type 2 diabetes which has been well controlled.  Last A1c 6.1%.  Remains on metformin.  No polydipsia or polyuria.  She has pain involving area between the right fourth and fifth toe.  Callused area.  No history of ulceration.  Past Medical History:  Diagnosis Date  . Allergy   . Anxiety   . Arthritis   . Asthma    patient denies   . Depression   . Diabetes mellitus    Type two  . Diverticulitis   . Family history of adverse reaction to anesthesia    daughter- N/V   . Frequency of urination   . GERD (gastroesophageal reflux disease)   . History of hiatal hernia   . Hyperglycemia   . Hyperlipidemia   . Hypertension   . Hyperthyroidism    PT HAD BIPOSY -NEGATIVE.Marland Kitchen AND NOW JUST GETS CHECKED EACH YEAR .Marland Kitchen 2013 LAST TEST   SEES DR. [name].   . 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 Obstructive sleep apnea    STUDY AT [facility] DOES NOT USE C PAP  . Palpitations   . Peptic ulcer disease   . Pneumonia    "walking pneumonia" hx of  . Seizures ([place])    one siezure with brain surgery none since 2015    Past Surgical History:  Procedure Laterality Date  . ABDOMINAL HYSTERECTOMY    . BACK SURGERY     L SPIN 2003   NECK 2004  . BRAIN MENINGIOMA EXCISION  8/10   X 2  . C5-C6 neck fusion    . COLONOSCOPY    . CRANIOTOMY  08/13/2011   Procedure: CRANIOTOMY TUMOR EXCISION;  Surgeon: [name], MD;  Location: [facility];  Service: Neurosurgery;  Laterality: Bilateral;  Bifrontal craniotomy for  tumor  . DILATION AND CURETTAGE OF UTERUS     YEARS AGO  . L4-L5 posterior la    . left foot plantar and hammertoe    . right foot bunectomy    . right knee arthroscopy     x 3  . right shoulder rotator cuff    . ROBOTIC ASSISTED BILATERAL SALPINGO OOPHERECTOMY N/A 08/25/2015   Procedure: XI ROBOTIC ASSISTED BILATERAL SALPINGO OOPHORECTOMY;  Surgeon: [name], MD;  Location: [facility];  Service: Gynecology;  Laterality: N/A;  . TONSILLECTOMY    . TUBAL LIGATION    . WRIST SURGERY Right     reports that she has never smoked. She has never used smokeless tobacco. She reports that she does not drink alcohol or use drugs. family history includes Anesthesia problems in her daughter; Arthritis in an other family member; Cancer in her mother; Diabetes in an other family member; Heart disease (age of onset: 6) in her father; Hyperlipidemia in an other family member; Hypertension in an other family member. Allergies  Allergen Reactions  . Flagyl [Metronidazole] Other (See Comments)    Caused increased heart rate  .  Aspirin Effervescent Swelling    Alka-Seltzer gel caps- sore mouth, face swollen, turned hands white  . Crestor [Rosuvastatin]   . Morphine Sulfate Hives    Hallucinations  . Penicillins Swelling and Rash    Has patient had a PCN reaction causing immediate rash, facial/tongue/throat swelling, SOB or lightheadedness with hypotension: Yes Has patient had a PCN reaction causing severe rash involving mucus membranes or skin necrosis: No Has patient had a PCN reaction that required hospitalization No Has patient had a PCN reaction occurring within the last 10 years: No If all of the above answers are "NO", then may proceed with Cephalosporin use.      Review of Systems  Constitutional: Negative for fatigue.  Eyes: Negative for visual disturbance.  Respiratory: Negative for cough, chest tightness, shortness of breath and wheezing.   Cardiovascular: Negative for chest pain,  palpitations and leg swelling.  Endocrine: Negative for polydipsia and polyuria.  Neurological: Negative for dizziness, seizures, syncope, weakness, light-headedness and headaches.       Objective:   Physical Exam Constitutional:      Appearance: She is well-developed.  Eyes:     Pupils: Pupils are equal, round, and reactive to light.  Neck:     Musculoskeletal: Neck supple.     Thyroid: No thyromegaly.     Vascular: No JVD.  Cardiovascular:     Rate and Rhythm: Normal rate and regular rhythm.     Heart sounds: No gallop.   Pulmonary:     Effort: Pulmonary effort is normal. No respiratory distress.     Breath sounds: Normal breath sounds. No wheezing or rales.  Skin:    Comments: She has small callus right fourth toe bordering the fifth toe.  No ulceration.  Neurological:     Mental Status: She is alert.        Assessment:     #1 type 2 diabetes well-controlled with A1c 6.0%  #2 dyslipidemia with recent cholesterol 319 and LDL 208  #3 small callus right fourth toe    Plan:     -We recommend a trial of Livalo 2 mg once daily.  We may have to go with less expensive options such as pravastatin but that would be less potent  -We suggest that she try to gently file callus right fourth toe and also consider over-the-counter Dr. Felicie Morn pad to help keep this from rubbing against the fifth toe.  Watch closely for any signs of ulceration  -Routine follow-up in 2 to 3 months and sooner as needed  Eulas Post MD [facility] at [facility]

## 2018-01-27 ENCOUNTER — Other Ambulatory Visit: Payer: Self-pay

## 2018-01-27 ENCOUNTER — Ambulatory Visit: Payer: Self-pay | Admitting: *Deleted

## 2018-01-27 DIAGNOSIS — E785 Hyperlipidemia, unspecified: Secondary | ICD-10-CM

## 2018-01-27 MED ORDER — ROSUVASTATIN CALCIUM 40 MG PO TABS: 40.0000 mg | ORAL_TABLET | Freq: Every day | ORAL | 3 refills | Status: DC

## 2018-01-27 NOTE — Telephone Encounter (Signed)
Called patient to let her know that I have sent the Rosuvastatin 40 mg [facility] [place] and I have deleted the Livalo from her med list.  Patient verbalized an understanding and already has an appointment on 03/26/18 with Dr. Elease Hashimoto. Labs ordered.

## 2018-01-27 NOTE — Telephone Encounter (Signed)
Take Livalo off list.  Start Crestor 40 mg po qd.  Repeat lipids/hepatic in about 8 weeks.

## 2018-01-27 NOTE — Addendum Note (Signed)
Addended by: Anibal Henderson on: 01/27/2018 02:36 PM   Modules accepted: Orders

## 2018-01-27 NOTE — Telephone Encounter (Signed)
Patient is needing a call back in regards to medication concerns she has. She doesn't know the exact name of the medication but stated she was given it in 2017.    Called patient back regarding the above message. She is wanting to try rosuvastatin or Crestor. Which ever one the insurance will pay for. This is the medication that you had been on 2017 and was stopped.  The Livalo is too expensive.  Pt requesting a call back regarding this medication. Routing to [facility] at [place].

## 2018-01-27 NOTE — Telephone Encounter (Signed)
  Reason for Disposition . Caller has NON-URGENT medication question about med that PCP prescribed and triager unable to answer question  Answer Assessment - Initial Assessment Questions 1. REASON FOR CALL or QUESTION: "What is your reason for calling today?" or "How can I best help you?" or "What question do you have that I can help answer?"     Pt wants to know what cholesterol medication she can get. 2. CALLER: Document the source of call. (e.g., laboratory, patient).     patient  Protocols used: MEDICATION QUESTION CALL-A-AH, PCP CALL - NO TRIAGE-A-AH

## 2018-01-27 NOTE — Telephone Encounter (Signed)
Patient had high cholesterol on the lab results from Dr. Ronnie Derby office and he stated that she needed to reach out to [place] for medication. I do see that she took Rosuvastatin/Crestor in the past. Please advise dosage if OK to start her on this medication.

## 2018-01-29 ENCOUNTER — Other Ambulatory Visit: Payer: Self-pay | Admitting: Family Medicine

## 2018-01-31 LAB — HM DIABETES EYE EXAM

## 2018-02-02 ENCOUNTER — Other Ambulatory Visit: Payer: Self-pay | Admitting: Family Medicine

## 2018-02-19 ENCOUNTER — Other Ambulatory Visit: Payer: Self-pay | Admitting: Endocrinology

## 2018-03-12 ENCOUNTER — Other Ambulatory Visit: Payer: Self-pay | Admitting: Family Medicine

## 2018-03-12 DIAGNOSIS — Z1231 Encounter for screening mammogram for malignant neoplasm of breast: Secondary | ICD-10-CM

## 2018-03-26 ENCOUNTER — Other Ambulatory Visit: Payer: Self-pay

## 2018-03-26 ENCOUNTER — Ambulatory Visit (INDEPENDENT_AMBULATORY_CARE_PROVIDER_SITE_OTHER): Payer: Medicare Other | Admitting: Family Medicine

## 2018-03-26 ENCOUNTER — Encounter: Payer: Self-pay | Admitting: Family Medicine

## 2018-03-26 VITALS — BP 110/78 | HR 87 | Temp 98.5°F | Ht 64.0 in | Wt 219.7 lb

## 2018-03-26 DIAGNOSIS — E785 Hyperlipidemia, unspecified: Secondary | ICD-10-CM | POA: Diagnosis not present

## 2018-03-26 DIAGNOSIS — R42 Dizziness and giddiness: Secondary | ICD-10-CM | POA: Diagnosis not present

## 2018-03-26 DIAGNOSIS — N181 Chronic kidney disease, stage 1: Secondary | ICD-10-CM

## 2018-03-26 DIAGNOSIS — E1122 Type 2 diabetes mellitus with diabetic chronic kidney disease: Secondary | ICD-10-CM

## 2018-03-26 LAB — LIPID PANEL
Cholesterol: 176 mg/dL (ref 0–200)
HDL: 77.8 mg/dL (ref >39.00)
LDL Cholesterol: 82 mg/dL (ref 0–99)
NonHDL: 98
Total CHOL/HDL Ratio: 2
Triglycerides: 81 mg/dL (ref 0.0–149.0)
VLDL: 16.2 mg/dL (ref 0.0–40.0)

## 2018-03-26 LAB — HEPATIC FUNCTION PANEL
ALT: 11 U/L (ref 0–35)
AST: 20 U/L (ref 0–37)
Albumin: 4.4 g/dL (ref 3.5–5.2)
Alkaline Phosphatase: 105 U/L (ref 39–117)
Bilirubin, Direct: 0.1 mg/dL (ref 0.0–0.3)
Total Bilirubin: 0.7 mg/dL (ref 0.2–1.2)
Total Protein: 6.5 g/dL (ref 6.0–8.3)

## 2018-03-26 LAB — POCT GLYCOSYLATED HEMOGLOBIN (HGB A1C): Hemoglobin A1C: 6.6 % — AB (ref 4.0–5.6)

## 2018-03-26 NOTE — Progress Notes (Signed)
Subjective:     Patient ID: Dawn Salas, female   DOB: 04-Jan-1941, 78 y.o.   MRN: [id number]  HPI Patient here for several issues as follows  Type 2 diabetes.  History of good control.  She has had slightly high recent fasting blood sugars after starting Crestor.  Hyperlipidemia.  She had very high cholesterol over 300.  She is currently on Crestor 40 mg daily.  She has some general arthralgias but no myalgias.  She had some recent vertigo issues especially to the right.  These tend to be mostly when getting in and out of bed.  Usually very transient.  No hearing changes.  No tinnitus.  No ataxia.  No headaches.  Past Medical History:  Diagnosis Date  . Allergy   . Anxiety   . Arthritis   . Asthma    patient denies   . Depression   . Diabetes mellitus    Type two  . Diverticulitis   . Family history of adverse reaction to anesthesia    daughter- N/V   . Frequency of urination   . GERD (gastroesophageal reflux disease)   . History of hiatal hernia   . Hyperglycemia   . Hyperlipidemia   . Hypertension   . Hyperthyroidism    PT HAD BIPOSY -NEGATIVE.Marland Kitchen AND NOW JUST GETS CHECKED EACH YEAR .Marland Kitchen 2013 LAST TEST   SEES DR. [name].   . 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 Obstructive sleep apnea    STUDY AT [facility] DOES NOT USE C PAP  . Palpitations   . Peptic ulcer disease   . Pneumonia    "walking pneumonia" hx of  . Seizures ([place])    one siezure with brain surgery none since 2015    Past Surgical History:  Procedure Laterality Date  . ABDOMINAL HYSTERECTOMY    . BACK SURGERY     L SPIN 2003   NECK 2004  . BRAIN MENINGIOMA EXCISION  8/10   X 2  . C5-C6 neck fusion    . COLONOSCOPY    . CRANIOTOMY  08/13/2011   Procedure: CRANIOTOMY TUMOR EXCISION;  Surgeon: [name], MD;  Location: [facility];  Service: Neurosurgery;  Laterality: Bilateral;  Bifrontal craniotomy for tumor  . DILATION AND CURETTAGE OF UTERUS     YEARS AGO  . L4-L5 posterior la     . left foot plantar and hammertoe    . right foot bunectomy    . right knee arthroscopy     x 3  . right shoulder rotator cuff    . ROBOTIC ASSISTED BILATERAL SALPINGO OOPHERECTOMY N/A 08/25/2015   Procedure: XI ROBOTIC ASSISTED BILATERAL SALPINGO OOPHORECTOMY;  Surgeon: [name], MD;  Location: [facility];  Service: Gynecology;  Laterality: N/A;  . TONSILLECTOMY    . TUBAL LIGATION    . WRIST SURGERY Right     reports that she has never smoked. She has never used smokeless tobacco. She reports that she does not drink alcohol or use drugs. family history includes Anesthesia problems in her daughter; Arthritis in an other family member; Cancer in her mother; Diabetes in an other family member; Heart disease (age of onset: 65) in her father; Hyperlipidemia in an other family member; Hypertension in an other family member. Allergies  Allergen Reactions  . Flagyl [Metronidazole] Other (See Comments)    Caused increased heart rate  . Aspirin Effervescent Swelling    Alka-Seltzer gel caps- sore mouth, face swollen, turned  hands white  . Crestor [Rosuvastatin]   . Morphine Sulfate Hives    Hallucinations  . Penicillins Swelling and Rash    Has patient had a PCN reaction causing immediate rash, facial/tongue/throat swelling, SOB or lightheadedness with hypotension: Yes Has patient had a PCN reaction causing severe rash involving mucus membranes or skin necrosis: No Has patient had a PCN reaction that required hospitalization No Has patient had a PCN reaction occurring within the last 10 years: No If all of the above answers are "NO", then may proceed with Cephalosporin use.      Review of Systems  Constitutional: Negative for fatigue.  Eyes: Negative for visual disturbance.  Respiratory: Negative for cough, chest tightness, shortness of breath and wheezing.   Cardiovascular: Negative for chest pain, palpitations and leg swelling.  Neurological: Positive for dizziness. Negative for  seizures, syncope, weakness, light-headedness and headaches.       Objective:   Physical Exam Constitutional:      Appearance: She is well-developed.  Eyes:     Pupils: Pupils are equal, round, and reactive to light.  Neck:     Musculoskeletal: Neck supple.     Thyroid: No thyromegaly.     Vascular: No JVD.  Cardiovascular:     Rate and Rhythm: Normal rate and regular rhythm.     Heart sounds: No gallop.   Pulmonary:     Effort: Pulmonary effort is normal. No respiratory distress.     Breath sounds: Normal breath sounds. No wheezing or rales.  Neurological:     Mental Status: She is alert.     Comments: Cranial nerves II through XII are intact.  Patient has reproducible vertigo with lying supine with head to the right position.  She has some associated horizontal nystagmus.  No ataxia.  No focal weakness.        Assessment:     #1 type 2 diabetes controlled with A1c 6.6%  #2 dyslipidemia currently treated with Crestor  #3 vertigo.  Suspect benign peripheral positional vertigo to the right    Plan:     -Recheck lipids and hepatic panel -Continue metformin for her diabetes -Discussed Epley maneuvers with handout given and instructions given.  If she is not seeing resolution of vertigo with these over the next few days be in touch -We will plan routine follow-up in 3 months and sooner as needed  Eulas Post MD [facility] at [facility]

## 2018-03-26 NOTE — Patient Instructions (Signed)
Benign Positional Vertigo  Vertigo is the feeling that you or your surroundings are moving when they are not. Benign positional vertigo is the most common form of vertigo. This is usually a harmless condition (benign). This condition is positional. This means that symptoms are triggered by certain movements and positions.  This condition can be dangerous if it occurs while you are doing something that could cause harm to you or others. This includes activities such as driving or operating machinery.  What are the causes?  In many cases, the cause of this condition is not known. It may be caused by a disturbance in an area of the inner ear that helps your brain to sense movement and balance. This disturbance can be caused by:   Viral infection (labyrinthitis).   Head injury.   Repetitive motion, such as jumping, dancing, or running.  What increases the risk?  You are more likely to develop this condition if:   You are a woman.   You are 50 years of age or older.  What are the signs or symptoms?  Symptoms of this condition usually happen when you move your head or your eyes in different directions. Symptoms may start suddenly, and usually last for less than a minute. They include:   Loss of balance and falling.   Feeling like you are spinning or moving.   Feeling like your surroundings are spinning or moving.   Nausea and vomiting.   Blurred vision.   Dizziness.   Involuntary eye movement (nystagmus).  Symptoms can be mild and cause only minor problems, or they can be severe and interfere with daily life. Episodes of benign positional vertigo may return (recur) over time. Symptoms may improve over time.  How is this diagnosed?  This condition may be diagnosed based on:   Your medical history.   Physical exam of the head, neck, and ears.   Tests, such as:  ? MRI.  ? CT scan.  ? Eye movement tests. Your health care provider may ask you to change positions quickly while he or she watches you for symptoms  of benign positional vertigo, such as nystagmus. Eye movement may be tested with a variety of exams that are designed to evaluate or stimulate vertigo.  ? An electroencephalogram (EEG). This records electrical activity in your brain.  ? Hearing tests.  You may be referred to a health care provider who specializes in ear, nose, and throat (ENT) problems (otolaryngologist) or a provider who specializes in disorders of the nervous system (neurologist).  How is this treated?    This condition may be treated in a session in which your health care provider moves your head in specific positions to adjust your inner ear back to normal. Treatment for this condition may take several sessions. Surgery may be needed in severe cases, but this is rare.  In some cases, benign positional vertigo may resolve on its own in 2-4 weeks.  Follow these instructions at home:  Safety   Move slowly. Avoid sudden body or head movements or certain positions, as told by your health care provider.   Avoid driving until your health care provider says it is safe for you to do so.   Avoid operating heavy machinery until your health care provider says it is safe for you to do so.   Avoid doing any tasks that would be dangerous to you or others if vertigo occurs.   If you have trouble walking or keeping your balance, try using   a cane for stability. If you feel dizzy or unstable, sit down right away.   Return to your normal activities as told by your health care provider. Ask your health care provider what activities are safe for you.  General instructions   Take over-the-counter and prescription medicines only as told by your health care provider.   Drink enough fluid to keep your urine pale yellow.   Keep all follow-up visits as told by your health care provider. This is important.  Contact a health care provider if:   You have a fever.   Your condition gets worse or you develop new symptoms.   Your family or friends notice any  behavioral changes.   You have nausea or vomiting that gets worse.   You have numbness or a "pins and needles" sensation.  Get help right away if you:   Have difficulty speaking or moving.   Are always dizzy.   Faint.   Develop severe headaches.   Have weakness in your legs or arms.   Have changes in your hearing or vision.   Develop a stiff neck.   Develop sensitivity to light.  Summary   Vertigo is the feeling that you or your surroundings are moving when they are not. Benign positional vertigo is the most common form of vertigo.   The cause of this condition is not known. It may be caused by a disturbance in an area of the inner ear that helps your brain to sense movement and balance.   Symptoms include loss of balance and falling, feeling that you or your surroundings are moving, nausea and vomiting, and blurred vision.   This condition can be diagnosed based on symptoms, physical exam, and other tests, such as MRI, CT scan, eye movement tests, and hearing tests.   Follow safety instructions as told by your health care provider. You will also be told when to contact your health care provider in case of problems.  This information is not intended to replace advice given to you by your health care provider. Make sure you discuss any questions you have with your health care provider.  Document Released: 10/16/2005 Document Revised: 06/19/2017 Document Reviewed: 06/19/2017  [employer]vier Interactive Patient Education  2019 [employer]

## 2018-03-27 ENCOUNTER — Telehealth: Payer: Self-pay | Admitting: Family Medicine

## 2018-03-27 NOTE — Telephone Encounter (Addendum)
Attempted to call pt but no answer at this time. Unable to leave message.

## 2018-03-27 NOTE — Telephone Encounter (Signed)
Copied from [place] [id number] [id number]. Topic: Quick Communication - Lab Results (Clinic Use ONLY) >> Mar 27, 2018 11:59 AM Yvette Rack wrote: Pt returned call for lab results. Pt requests call back. Cb# [phone]

## 2018-03-28 ENCOUNTER — Other Ambulatory Visit: Payer: Self-pay | Admitting: Family Medicine

## 2018-04-14 ENCOUNTER — Ambulatory Visit: Payer: 59

## 2018-04-14 ENCOUNTER — Other Ambulatory Visit: Payer: Self-pay | Admitting: Family Medicine

## 2018-05-03 ENCOUNTER — Other Ambulatory Visit: Payer: Self-pay | Admitting: Family Medicine

## 2018-05-10 ENCOUNTER — Other Ambulatory Visit: Payer: Self-pay | Admitting: Family Medicine

## 2018-05-14 ENCOUNTER — Ambulatory Visit: Payer: 59

## 2018-06-24 ENCOUNTER — Other Ambulatory Visit: Payer: Self-pay | Admitting: Endocrinology

## 2018-06-27 ENCOUNTER — Ambulatory Visit (INDEPENDENT_AMBULATORY_CARE_PROVIDER_SITE_OTHER): Payer: Medicare Other | Admitting: Family Medicine

## 2018-06-27 ENCOUNTER — Encounter: Payer: Self-pay | Admitting: Family Medicine

## 2018-06-27 ENCOUNTER — Other Ambulatory Visit: Payer: Self-pay

## 2018-06-27 VITALS — BP 140/68 | HR 95 | Temp 98.1°F | Wt 215.3 lb

## 2018-06-27 DIAGNOSIS — I10 Essential (primary) hypertension: Secondary | ICD-10-CM

## 2018-06-27 DIAGNOSIS — N181 Chronic kidney disease, stage 1: Secondary | ICD-10-CM

## 2018-06-27 DIAGNOSIS — E7800 Pure hypercholesterolemia, unspecified: Secondary | ICD-10-CM

## 2018-06-27 DIAGNOSIS — E1122 Type 2 diabetes mellitus with diabetic chronic kidney disease: Secondary | ICD-10-CM

## 2018-06-27 LAB — POCT GLYCOSYLATED HEMOGLOBIN (HGB A1C): Hemoglobin A1C: 6.6 % — AB (ref 4.0–5.6)

## 2018-06-27 NOTE — Progress Notes (Signed)
Subjective:     Patient ID: Dawn Salas, female   DOB: 1940/07/10, 78 y.o.   MRN: [id number]  HPI  Patient in for medical follow-up  Hyperlipidemia.  Recent initiation of Crestor.  Her cholesterol was 319 and this came down to 176.  She did notice slight bump in blood sugars but otherwise tolerating well.  Her blood pressures been fairly well controlled at home.  Recent blood pressures ranging around 130/80.  She is taking one half of her losartan.  She also remains on metoprolol as well as amlodipine.  No recent headaches or dizziness.  No chest pains.  She has been somewhat less compliant with diet but is exercising 45 minutes daily.  Blood sugars relatively stable.  Past Medical History:  Diagnosis Date  . Allergy   . Anxiety   . Arthritis   . Asthma    patient denies   . Depression   . Diabetes mellitus    Type two  . Diverticulitis   . Family history of adverse reaction to anesthesia    daughter- N/V   . Frequency of urination   . GERD (gastroesophageal reflux disease)   . History of hiatal hernia   . Hyperglycemia   . Hyperlipidemia   . Hypertension   . Hyperthyroidism    PT HAD BIPOSY -NEGATIVE.Marland Kitchen AND NOW JUST GETS CHECKED EACH YEAR .Marland Kitchen 2013 LAST TEST   SEES DR. [name].   . Multiple meningiomas of spine and brain ([place])   . Obstructive sleep apnea    STUDY AT [facility] DOES NOT USE C PAP  . Palpitations   . Peptic ulcer disease   . Pneumonia    "walking pneumonia" hx of  . Seizures ([place])    one siezure with brain surgery none since 2015    Past Surgical History:  Procedure Laterality Date  . ABDOMINAL HYSTERECTOMY    . BACK SURGERY     L SPIN 2003   NECK 2004  . BRAIN MENINGIOMA EXCISION  8/10   X 2  . C5-C6 neck fusion    . COLONOSCOPY    . CRANIOTOMY  08/13/2011   Procedure: CRANIOTOMY TUMOR EXCISION;  Surgeon: [name], MD;  Location: [facility];  Service: Neurosurgery;  Laterality: Bilateral;  Bifrontal craniotomy for tumor  . DILATION AND  CURETTAGE OF UTERUS     YEARS AGO  . L4-L5 posterior la    . left foot plantar and hammertoe    . right foot bunectomy    . right knee arthroscopy     x 3  . right shoulder rotator cuff    . ROBOTIC ASSISTED BILATERAL SALPINGO OOPHERECTOMY N/A 08/25/2015   Procedure: XI ROBOTIC ASSISTED BILATERAL SALPINGO OOPHORECTOMY;  Surgeon: [name], MD;  Location: [facility];  Service: Gynecology;  Laterality: N/A;  . TONSILLECTOMY    . TUBAL LIGATION    . WRIST SURGERY Right     reports that she has never smoked. She has never used smokeless tobacco. She reports that she does not drink alcohol or use drugs. family history includes Anesthesia problems in her daughter; Arthritis in an other family member; Cancer in her mother; Diabetes in an other family member; Heart disease (age of onset: 53) in her father; Hyperlipidemia in an other family member; Hypertension in an other family member. Allergies  Allergen Reactions  . Flagyl [Metronidazole] Other (See Comments)    Caused increased heart rate  . Aspirin Effervescent Swelling    Alka-Seltzer gel caps-  sore mouth, face swollen, turned hands white  . Crestor [Rosuvastatin]   . Morphine Sulfate Hives    Hallucinations  . Penicillins Swelling and Rash    Has patient had a PCN reaction causing immediate rash, facial/tongue/throat swelling, SOB or lightheadedness with hypotension: Yes Has patient had a PCN reaction causing severe rash involving mucus membranes or skin necrosis: No Has patient had a PCN reaction that required hospitalization No Has patient had a PCN reaction occurring within the last 10 years: No If all of the above answers are "NO", then may proceed with Cephalosporin use.      Review of Systems  Constitutional: Negative for fatigue.  Eyes: Negative for visual disturbance.  Respiratory: Negative for cough, chest tightness, shortness of breath and wheezing.   Cardiovascular: Negative for chest pain, palpitations and leg  swelling.  Neurological: Negative for dizziness, seizures, syncope, weakness, light-headedness and headaches.       Objective:   Physical Exam Constitutional:      Appearance: She is well-developed.  HENT:     Head: Normocephalic and atraumatic.  Eyes:     Pupils: Pupils are equal, round, and reactive to light.  Neck:     Musculoskeletal: Normal range of motion and neck supple.     Thyroid: No thyromegaly.  Cardiovascular:     Rate and Rhythm: Normal rate and regular rhythm.     Heart sounds: Normal heart sounds. No murmur.  Pulmonary:     Effort: No respiratory distress.     Breath sounds: Normal breath sounds. No wheezing or rales.  Abdominal:     General: Bowel sounds are normal. There is no distension.     Palpations: Abdomen is soft. There is no mass.     Tenderness: There is no abdominal tenderness. There is no guarding or rebound.  Musculoskeletal: Normal range of motion.  Lymphadenopathy:     Cervical: No cervical adenopathy.  Skin:    Findings: No rash.  Neurological:     Mental Status: She is alert and oriented to person, place, and time.     Cranial Nerves: No cranial nerve deficit.     Deep Tendon Reflexes: Reflexes normal.  Psychiatric:        Behavior: Behavior normal.        Thought Content: Thought content normal.        Judgment: Judgment normal.        Assessment:     #1 hypertension.  Slightly up today but better control at home  #2 hyperlipidemia improved on Crestor  #3 type 2 diabetes which has been well controlled  #4 hypothyroidism followed by endocrinology    Plan:     -Continue regular exercise habits -Recheck A1c today= 6.6% which is unchanged from last value -Monitor blood pressure and if consistently greater than 130/80 go back to 1 full tablet of losartan daily -We will plan routine follow-up in 3 months and sooner as needed  Eulas Post MD [facility]

## 2018-07-04 ENCOUNTER — Ambulatory Visit: Payer: 59

## 2018-07-21 ENCOUNTER — Other Ambulatory Visit: Payer: Self-pay

## 2018-07-21 ENCOUNTER — Ambulatory Visit (INDEPENDENT_AMBULATORY_CARE_PROVIDER_SITE_OTHER): Payer: Medicare Other | Admitting: Endocrinology

## 2018-07-21 VITALS — BP 158/90 | HR 88 | Temp 98.3°F | Ht 64.0 in | Wt 216.6 lb

## 2018-07-21 DIAGNOSIS — E669 Obesity, unspecified: Secondary | ICD-10-CM | POA: Diagnosis not present

## 2018-07-21 DIAGNOSIS — E1169 Type 2 diabetes mellitus with other specified complication: Secondary | ICD-10-CM

## 2018-07-21 DIAGNOSIS — E890 Postprocedural hypothyroidism: Secondary | ICD-10-CM

## 2018-07-21 LAB — T4, FREE: Free T4: 1.1 ng/dL (ref 0.60–1.60)

## 2018-07-21 LAB — TSH: TSH: 0.17 u[IU]/mL — ABNORMAL LOW (ref 0.35–4.50)

## 2018-07-21 NOTE — Patient Instructions (Signed)
Check blood sugars on waking up 2-3 days a week  Also check blood sugars about 2 hours after meals and do this after different meals by rotation  Recommended blood sugar levels on waking up are 90-130 and about 2 hours after meal is 130-160  Please bring your blood sugar monitor to each visit, thank you  Metformin at lunch and dinner

## 2018-07-21 NOTE — Progress Notes (Signed)
Patient ID: Dawn Salas, female   DOB: 06/11/1940, 78 y.o.   MRN: [id number]                                                                                                                  Chief complaint: Endocrinology follow-up   History of Present Illness:   History obtained on initial consultation:  She was diagnosed as having Hyperthyroidism in 2015 when she was having palpitations and was seen to have a goiter. At that time she was having the usual symptoms of fatigue, shakiness, palpitations, heat intolerance and some weight loss No details of this are available She was treated with I-131, unknown dose which was prescribed in March 2015 Apparently she had a large hot nodule on the left, however details of her treatment are not available  Apparently subsequently her hyperthyroidism recurred and methimazole restarted ?  In 4/17 Also the dose was increased up to 7.5 mg in 10/2015  Apparently prior to her being treated for the hot nodule she had a multinodular goiter evaluated with a fine-needle aspiration in 09/2010  Since she was significantly hyperthyroid on her initial exam in February and free T3 was 5.0 and was treated with methimazole Because of persistent hyperthyroidism and very high thyrotropin receptor antibody she was given I-131 treatment with 20.3 mCi on 06/04/16  RECENT history:  She has been HYPOTHYROID as of 07/2016 with a low free T4 level  She was also having some fatigue and cold sensitivity as well as the muscle cramps With starting levothyroxine 125 g she felt better, however subsequently TSH was low again the dose was reduced down to 100 g  Her last visit was in 12/2017 She may occasionally feel a little tired but not consistently Her weight is about the same No change in temperature sensitivity  She is now taking her 100 g of levothyroxine 6 days a week and the dose has not been changed for some time  She takes her levothyroxine consistently in  the morning before breakfast and has no supplements at the same time  Ophthalmopathy:  She is having good results with wearing prism lenses for her persistent diplopia She says she has some photosensitivity during the day  No prominence of the eyes or dryness, followed by ophthalmologist   Wt Readings from Last 3 Encounters:  07/21/18 216 lb 9.6 oz (98.2 kg)  06/27/18 215 lb 4.8 oz (97.7 kg)  03/26/18 219 lb 11.2 oz (99.7 kg)      Thyroid function tests as follows:     Lab Results  Component Value Date   FREET4 0.95 01/17/2018   FREET4 0.92 03/21/2017   FREET4 1.03 01/02/2017   T3FREE 3.3 03/21/2017   T3FREE 3.1 07/06/2016   T3FREE 3.6 05/23/2016   TSH 1.61 01/17/2018   TSH 0.71 07/19/2017   TSH 0.62 03/21/2017    Lab Results  Component Value Date   THYROTRECAB 87.70 (H) 04/11/2016   DIABETES: See review of systems     Allergies as  of 07/21/2018      Reactions   Flagyl [metronidazole] Other (See Comments)   Caused increased heart rate   Aspirin Effervescent Swelling   Alka-Seltzer gel caps- sore mouth, face swollen, turned hands white   Crestor [rosuvastatin]    Morphine Sulfate Hives   Hallucinations   Penicillins Swelling, Rash   Has patient had a PCN reaction causing immediate rash, facial/tongue/throat swelling, SOB or lightheadedness with hypotension: Yes Has patient had a PCN reaction causing severe rash involving mucus membranes or skin necrosis: No Has patient had a PCN reaction that required hospitalization No Has patient had a PCN reaction occurring within the last 10 years: No If all of the above answers are "NO", then may proceed with Cephalosporin use.      Medication List       Accurate as of July 21, 2018 10:15 AM. If you have any questions, ask your nurse or doctor.        accu-chek softclix lancets 1 each by Other route 2 (two) times daily. E11.9   Accu-Chek Softclix Lancets lancets TEST BLOOD SUGAR TWICE DAILY.   amLODipine 5 MG  tablet Commonly known as: NORVASC Take 1 tablet (5 mg total) by mouth daily.   cetirizine 10 MG tablet Commonly known as: ZYRTEC Take 10 mg by mouth daily as needed. For allergies   furosemide 40 MG tablet Commonly known as: LASIX Take 0.5 tablets (20 mg total) by mouth daily as needed. For fluid   GERITOL COMPLETE PO Take 1 tablet by mouth daily.   levothyroxine 100 MCG tablet Commonly known as: SYNTHROID TAKE 1 TABLET DAILY EXCEPT SUNDAY   losartan-hydrochlorothiazide 100-12.5 MG tablet Commonly known as: HYZAAR TAKE 1 TABLET DAILY   Magnesium 250 MG Tabs Take 250 mg by mouth daily.   metFORMIN 500 MG tablet Commonly known as: GLUCOPHAGE TAKE ONE TABLET TWICE A DAY WITH FOOD   metoprolol succinate 100 MG 24 hr tablet Commonly known as: TOPROL-XL TAKE ONE (1) TABLET EACH DAY   potassium chloride 10 MEQ tablet Commonly known as: K-DUR TAKE ONE (1) TABLET EACH DAY   promethazine 25 MG tablet Commonly known as: PHENERGAN Take 1 tablet (25 mg total) by mouth every 6 (six) hours as needed for nausea or vomiting.   RABEprazole 20 MG tablet Commonly known as: ACIPHEX TAKE ONE (1) TABLET EACH DAY   rosuvastatin 40 MG tablet Commonly known as: CRESTOR TAKE ONE (1) TABLET EACH DAY   solifenacin 5 MG tablet Commonly known as: VESICARE TAKE ONE (1) TABLET EACH DAY   valACYclovir 1000 MG tablet Commonly known as: VALTREX TAKE 2 TABS TWICE DAILY FOR 1 DAY THEN AS NEEDED   VITAMIN B12 PO Take 1 mL by mouth daily. OTC           Past Medical History:  Diagnosis Date  . Allergy   . Anxiety   . Arthritis   . Asthma    patient denies   . Depression   . Diabetes mellitus    Type two  . Diverticulitis   . Family history of adverse reaction to anesthesia    daughter- N/V   . Frequency of urination   . GERD (gastroesophageal reflux disease)   . History of hiatal hernia   . Hyperglycemia   . Hyperlipidemia   . Hypertension   . Hyperthyroidism    PT HAD  BIPOSY -NEGATIVE.Marland Kitchen AND NOW JUST GETS CHECKED EACH YEAR .Marland Kitchen 2013 LAST TEST   SEES DR. [name].   Marland Kitchen  Multiple meningiomas of spine and brain ([place])   . Obstructive sleep apnea    STUDY AT [facility] DOES NOT USE C PAP  . Palpitations   . Peptic ulcer disease   . Pneumonia    "walking pneumonia" hx of  . Seizures ([place])    one siezure with brain surgery none since 2015     Past Surgical History:  Procedure Laterality Date  . ABDOMINAL HYSTERECTOMY    . BACK SURGERY     L SPIN 2003   NECK 2004  . BRAIN MENINGIOMA EXCISION  8/10   X 2  . C5-C6 neck fusion    . COLONOSCOPY    . CRANIOTOMY  08/13/2011   Procedure: CRANIOTOMY TUMOR EXCISION;  Surgeon: [name], MD;  Location: [facility];  Service: Neurosurgery;  Laterality: Bilateral;  Bifrontal craniotomy for tumor  . DILATION AND CURETTAGE OF UTERUS     YEARS AGO  . L4-L5 posterior la    . left foot plantar and hammertoe    . right foot bunectomy    . right knee arthroscopy     x 3  . right shoulder rotator cuff    . ROBOTIC ASSISTED BILATERAL SALPINGO OOPHERECTOMY N/A 08/25/2015   Procedure: XI ROBOTIC ASSISTED BILATERAL SALPINGO OOPHORECTOMY;  Surgeon: [name], MD;  Location: [facility];  Service: Gynecology;  Laterality: N/A;  . TONSILLECTOMY    . TUBAL LIGATION    . WRIST SURGERY Right     Family History  Problem Relation Age of Onset  . Cancer Mother        ovarian  . Heart disease Father 75  . Arthritis Other   . Hyperlipidemia Other   . Hypertension Other   . Diabetes Other   . Anesthesia problems Daughter        NAUSEA AND VOMITING POST OP  . Breast cancer Neg Hx     Social History:  reports that she has never smoked. She has never used smokeless tobacco. She reports that she does not drink alcohol or use drugs.  Allergies:  Allergies  Allergen Reactions  . Flagyl [Metronidazole] Other (See Comments)    Caused increased heart rate  . Aspirin Effervescent Swelling    Alka-Seltzer gel caps- sore mouth,  face swollen, turned hands white  . Crestor [Rosuvastatin]   . Morphine Sulfate Hives    Hallucinations  . Penicillins Swelling and Rash    Has patient had a PCN reaction causing immediate rash, facial/tongue/throat swelling, SOB or lightheadedness with hypotension: Yes Has patient had a PCN reaction causing severe rash involving mucus membranes or skin necrosis: No Has patient had a PCN reaction that required hospitalization No Has patient had a PCN reaction occurring within the last 10 years: No If all of the above answers are "NO", then may proceed with Cephalosporin use.       Review of Systems    DIABETES  Followed by PCP and treated with metformin twice a day at breakfast and dinner  At times will feel a little shaky, sweaty or weak usually before lunch and she thinks her blood sugars may be in the 70s at times with this She also may feel symptomatic if blood sugar is below 100 She was suggested moving her metformin from breakfast to lunch but she has not done so She does get enough protein in the morning at breakfast  Weight is slightly lower  She is trying to do an exercise bike for increasing her activity  level now   Lab Results  Component Value Date   HGBA1C 6.6 (A) 06/27/2018   HGBA1C 6.6 (A) 03/26/2018   HGBA1C 6.0 (A) 01/24/2018   Lab Results  Component Value Date   MICROALBUR <0.7 01/17/2018   LDLCALC 82 03/26/2018   CREATININE 1.24 (H) 01/17/2018   HYPERTENSION: Followed by PCP  BP Readings from Last 3 Encounters:  07/21/18 (!) 158/90  06/27/18 140/68  03/26/18 110/78   Home blood pressure readings: 1 30/80   Examination:   BP (!) 158/90 (BP Location: Left Arm, Patient Position: Sitting, Cuff Size: Normal) Comment: [place] for breakfast - not sure if took BP meds  Pulse 88   Temp 98.3 F (36.8 C) (Oral)   Ht 5\' 4"  (1.626 m)   Wt 216 lb 9.6 oz (98.2 kg)   SpO2 98%   BMI 37.18 kg/m        Assessment/Plan:  HYPOTHYROIDISM  secondary to ablation of thyroid with I-131  She is taking 100 g levothyroxine only 6 days a week, the dose has been stable She has occasional fatigue which is unlikely to be related to her thyroid She has been compliant with her supplement   Ophthalmopathy: She has been treated for her diplopia with prism lenses by ophthalmologist  DIABETES with obesity: This is well controlled on low-dose metformin  This is followed by her PCP with last A1c 6.6 However she still has some variability in her blood sugars and occasionally higher She mostly monitors fasting readings  Recommendations:  Check thyroid levels again today  Follow-up in 6 months if TSH results are normal  For her diabetes she can try taking metformin at lunch and bedtime and not in the morning However if she has more consistent high readings after meals she may benefit from a second drug She thinks she can try to be more consistent with diet also  She will continue to follow-up with PCP for diabetes  HYPERTENSION: Blood pressure is higher today and she thinks this is from her rushing Continue to follow-up with PCP    There are no Patient Instructions on file for this visit.  Elayne Snare 07/21/2018, 10:15 AM

## 2018-07-22 ENCOUNTER — Other Ambulatory Visit: Payer: Self-pay

## 2018-07-22 LAB — BASIC METABOLIC PANEL
BUN: 16 mg/dL (ref 6–23)
CO2: 24 mEq/L (ref 19–32)
Calcium: 8.9 mg/dL (ref 8.4–10.5)
Chloride: 102 mEq/L (ref 96–112)
Creatinine, Ser: 1.32 mg/dL — ABNORMAL HIGH (ref 0.40–1.20)
GFR: 47.39 mL/min — ABNORMAL LOW (ref >60.00)
Glucose, Bld: 112 mg/dL — ABNORMAL HIGH (ref 70–99)
Potassium: 4.1 mEq/L (ref 3.5–5.1)
Sodium: 139 mEq/L (ref 135–145)

## 2018-07-22 MED ORDER — LEVOTHYROXINE SODIUM 75 MCG PO TABS: 75.0000 ug | ORAL_TABLET | Freq: Every day | ORAL | 0 refills | Status: DC

## 2018-08-06 ENCOUNTER — Ambulatory Visit
Admission: RE | Admit: 2018-08-06 | Discharge: 2018-08-06 | Disposition: A | Discharge status: Home / Self Care | Payer: Medicare Other | Source: Ambulatory Visit | Attending: Family Medicine | Admitting: Family Medicine

## 2018-08-06 ENCOUNTER — Other Ambulatory Visit: Payer: Self-pay

## 2018-08-06 DIAGNOSIS — Z1231 Encounter for screening mammogram for malignant neoplasm of breast: Secondary | ICD-10-CM

## 2018-08-11 ENCOUNTER — Other Ambulatory Visit: Payer: Self-pay | Admitting: Family Medicine

## 2018-08-25 ENCOUNTER — Other Ambulatory Visit: Payer: Self-pay | Admitting: Family Medicine

## 2018-09-16 ENCOUNTER — Other Ambulatory Visit: Payer: Self-pay

## 2018-09-16 ENCOUNTER — Encounter: Payer: Self-pay | Admitting: Endocrinology

## 2018-09-16 ENCOUNTER — Ambulatory Visit (INDEPENDENT_AMBULATORY_CARE_PROVIDER_SITE_OTHER): Payer: Medicare Other | Admitting: Endocrinology

## 2018-09-16 VITALS — BP 120/75 | HR 97 | Ht 64.0 in | Wt 214.0 lb

## 2018-09-16 DIAGNOSIS — R531 Weakness: Secondary | ICD-10-CM

## 2018-09-16 DIAGNOSIS — I10 Essential (primary) hypertension: Secondary | ICD-10-CM

## 2018-09-16 DIAGNOSIS — E890 Postprocedural hypothyroidism: Secondary | ICD-10-CM

## 2018-09-16 LAB — CBC WITH DIFFERENTIAL/PLATELET
Basophils Absolute: 0 10*3/uL (ref 0.0–0.1)
Basophils Relative: 0.8 % (ref 0.0–3.0)
Eosinophils Absolute: 0.1 10*3/uL (ref 0.0–0.7)
Eosinophils Relative: 1.4 % (ref 0.0–5.0)
HCT: 38.9 % (ref 36.0–46.0)
Hemoglobin: 12.6 g/dL (ref 12.0–15.0)
Lymphocytes Relative: 23.4 % (ref 12.0–46.0)
Lymphs Abs: 1.1 10*3/uL (ref 0.7–4.0)
MCHC: 32.4 g/dL (ref 30.0–36.0)
MCV: 80 fl (ref 78.0–100.0)
Monocytes Absolute: 0.2 10*3/uL (ref 0.1–1.0)
Monocytes Relative: 5.2 % (ref 3.0–12.0)
Neutro Abs: 3.3 10*3/uL (ref 1.4–7.7)
Neutrophils Relative %: 69.2 % (ref 43.0–77.0)
Platelets: 205 10*3/uL (ref 150.0–400.0)
RBC: 4.86 Mil/uL (ref 3.87–5.11)
RDW: 15.6 % — ABNORMAL HIGH (ref 11.5–15.5)
WBC: 4.8 10*3/uL (ref 4.0–10.5)

## 2018-09-16 LAB — BASIC METABOLIC PANEL
BUN: 18 mg/dL (ref 6–23)
CO2: 28 mEq/L (ref 19–32)
Calcium: 9.5 mg/dL (ref 8.4–10.5)
Chloride: 101 mEq/L (ref 96–112)
Creatinine, Ser: 1.22 mg/dL — ABNORMAL HIGH (ref 0.40–1.20)
GFR: 51.88 mL/min — ABNORMAL LOW (ref >60.00)
Glucose, Bld: 99 mg/dL (ref 70–99)
Potassium: 4.2 mEq/L (ref 3.5–5.1)
Sodium: 139 mEq/L (ref 135–145)

## 2018-09-16 LAB — TSH: TSH: 0.37 u[IU]/mL (ref 0.35–4.50)

## 2018-09-16 LAB — T4, FREE: Free T4: 1.23 ng/dL (ref 0.60–1.60)

## 2018-09-16 NOTE — Patient Instructions (Signed)
Take Losartan 1/2 am and 1/2 pm

## 2018-09-16 NOTE — Progress Notes (Signed)
Patient ID: Dawn Salas, female   DOB: 07/28/1940, 78 y.o.   MRN: [id number]                                                                                                                  Chief complaint: Endocrinology follow-up   History of Present Illness:   History obtained on initial consultation:  She was diagnosed as having Hyperthyroidism in 2015 when she was having palpitations and was seen to have a goiter. At that time she was having the usual symptoms of fatigue, shakiness, palpitations, heat intolerance and some weight loss No details of this are available She was treated with I-131, unknown dose which was prescribed in March 2015 Apparently she had a large hot nodule on the left, however details of her treatment are not available  Apparently subsequently her hyperthyroidism recurred and methimazole restarted ?  In 4/17 Also the dose was increased up to 7.5 mg in 10/2015  Apparently prior to her being treated for the hot nodule she had a multinodular goiter evaluated with a fine-needle aspiration in 09/2010  Since she was significantly hyperthyroid on her initial exam in February and free T3 was 5.0 and was treated with methimazole Because of persistent hyperthyroidism and very high thyrotropin receptor antibody she was given I-131 treatment with 20.3 mCi on 06/04/16  RECENT history:  She has been HYPOTHYROID as of 07/2016 with a low free T4 level  She was also having some fatigue and cold sensitivity as well as the muscle cramps With starting levothyroxine 125 g she felt better, however subsequently TSH was low again the dose was reduced down to 100 g  Her last visit was in 12/2017 She may occasionally feel a little tired but not consistently Her weight is about the same No change in temperature sensitivity  She is now taking 75g of levothyroxine daily and her dose was reduced slightly in June, previously taking the equivalent of 85 mcg daily  However she  said that she is feeling more tired in the last month Her weight is down 2 pounds  She takes her levothyroxine daily in the morning before breakfast without any iron or calcium-containing vitamins  Wt Readings from Last 3 Encounters:  09/16/18 214 lb (97.1 kg)  07/21/18 216 lb 9.6 oz (98.2 kg)  06/27/18 215 lb 4.8 oz (97.7 kg)     Thyroid function tests as follows:     Lab Results  Component Value Date   FREET4 1.10 07/21/2018   FREET4 0.95 01/17/2018   FREET4 0.92 03/21/2017   T3FREE 3.3 03/21/2017   T3FREE 3.1 07/06/2016   T3FREE 3.6 05/23/2016   TSH 0.17 (L) 07/21/2018   TSH 1.61 01/17/2018   TSH 0.71 07/19/2017    Lab Results  Component Value Date   THYROTRECAB 87.70 (H) 04/11/2016   Ophthalmopathy:  Has persistent diplopia.  She is having good results with wearing prism lenses No prominence of the eyes or dryness, followed by ophthalmologist regularly  DIABETES: See review of  systems     Allergies as of 09/16/2018      Reactions   Flagyl [metronidazole] Other (See Comments)   Caused increased heart rate   Aspirin Effervescent Swelling   Alka-Seltzer gel caps- sore mouth, face swollen, turned hands white   Crestor [rosuvastatin]    Morphine Sulfate Hives   Hallucinations   Penicillins Swelling, Rash   Has patient had a PCN reaction causing immediate rash, facial/tongue/throat swelling, SOB or lightheadedness with hypotension: Yes Has patient had a PCN reaction causing severe rash involving mucus membranes or skin necrosis: No Has patient had a PCN reaction that required hospitalization No Has patient had a PCN reaction occurring within the last 10 years: No If all of the above answers are "NO", then may proceed with Cephalosporin use.      Medication List       Accurate as of September 16, 2018  4:38 PM. If you have any questions, ask your nurse or doctor.        accu-chek softclix lancets 1 each by Other route 2 (two) times daily. E11.9   Accu-Chek  Softclix Lancets lancets TEST BLOOD SUGAR TWICE DAILY.   amLODipine 5 MG tablet Commonly known as: NORVASC TAKE ONE (1) TABLET EACH DAY   cetirizine 10 MG tablet Commonly known as: ZYRTEC Take 10 mg by mouth daily as needed. For allergies   furosemide 40 MG tablet Commonly known as: LASIX Take 0.5 tablets (20 mg total) by mouth daily as needed. For fluid   GERITOL COMPLETE PO Take 1 tablet by mouth daily.   levothyroxine 75 MCG tablet Commonly known as: SYNTHROID Take 1 tablet (75 mcg total) by mouth daily.   losartan-hydrochlorothiazide 100-12.5 MG tablet Commonly known as: HYZAAR TAKE 1 TABLET DAILY   Magnesium 250 MG Tabs Take 250 mg by mouth daily.   metFORMIN 500 MG tablet Commonly known as: GLUCOPHAGE TAKE ONE TABLET TWICE A DAY WITH FOOD   metoprolol succinate 100 MG 24 hr tablet Commonly known as: TOPROL-XL TAKE ONE (1) TABLET EACH DAY   potassium chloride 10 MEQ tablet Commonly known as: K-DUR TAKE ONE (1) TABLET EACH DAY   promethazine 25 MG tablet Commonly known as: PHENERGAN Take 1 tablet (25 mg total) by mouth every 6 (six) hours as needed for nausea or vomiting.   RABEprazole 20 MG tablet Commonly known as: ACIPHEX TAKE ONE (1) TABLET EACH DAY   rosuvastatin 40 MG tablet Commonly known as: CRESTOR TAKE ONE (1) TABLET EACH DAY   solifenacin 5 MG tablet Commonly known as: VESICARE TAKE ONE (1) TABLET EACH DAY   valACYclovir 1000 MG tablet Commonly known as: VALTREX TAKE 2 TABS TWICE DAILY FOR 1 DAY THEN AS NEEDED   VITAMIN B12 PO Take 1 mL by mouth daily. OTC           Past Medical History:  Diagnosis Date  . Allergy   . Anxiety   . Arthritis   . Asthma    patient denies   . Depression   . Diabetes mellitus    Type two  . Diverticulitis   . Family history of adverse reaction to anesthesia    daughter- N/V   . Frequency of urination   . GERD (gastroesophageal reflux disease)   . History of hiatal hernia   . Hyperglycemia    . Hyperlipidemia   . Hypertension   . Hyperthyroidism    PT HAD BIPOSY -NEGATIVE.Marland Kitchen AND NOW JUST GETS CHECKED EACH YEAR .Marland Kitchen 2013 LAST TEST  SEES DR. [name].   . 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 Obstructive sleep apnea    STUDY [facility] DOES NOT USE C PAP  . Palpitations   . Peptic ulcer disease   . Pneumonia    "walking pneumonia" hx of  . Seizures ([place])    one siezure with brain surgery none since 2015     Past Surgical History:  Procedure Laterality Date  . ABDOMINAL HYSTERECTOMY    . BACK SURGERY     L SPIN 2003   NECK 2004  . BRAIN MENINGIOMA EXCISION  8/10   X 2  . C5-C6 neck fusion    . COLONOSCOPY    . CRANIOTOMY  08/13/2011   Procedure: CRANIOTOMY TUMOR EXCISION;  Surgeon: [name], MD;  Location: [facility];  Service: Neurosurgery;  Laterality: Bilateral;  Bifrontal craniotomy for tumor  . DILATION AND CURETTAGE OF UTERUS     YEARS AGO  . L4-L5 posterior la    . left foot plantar and hammertoe    . right foot bunectomy    . right knee arthroscopy     x 3  . right shoulder rotator cuff    . ROBOTIC ASSISTED BILATERAL SALPINGO OOPHERECTOMY N/A 08/25/2015   Procedure: XI ROBOTIC ASSISTED BILATERAL SALPINGO OOPHORECTOMY;  Surgeon: [name], MD;  Location: [facility];  Service: Gynecology;  Laterality: N/A;  . TONSILLECTOMY    . TUBAL LIGATION    . WRIST SURGERY Right     Family History  Problem Relation Age of Onset  . Cancer Mother        ovarian  . Heart disease Father 30  . Arthritis Other   . Hyperlipidemia Other   . Hypertension Other   . Diabetes Other   . Anesthesia problems Daughter        NAUSEA AND VOMITING POST OP  . Breast cancer Neg Hx     Social History:  reports that she has never smoked. She has never used smokeless tobacco. She reports that she does not drink alcohol or use drugs.  Allergies:  Allergies  Allergen Reactions  . Flagyl [Metronidazole] Other (See Comments)    Caused increased heart rate  .  Aspirin Effervescent Swelling    Alka-Seltzer gel caps- sore mouth, face swollen, turned hands white  . Crestor [Rosuvastatin]   . Morphine Sulfate Hives    Hallucinations  . Penicillins Swelling and Rash    Has patient had a PCN reaction causing immediate rash, facial/tongue/throat swelling, SOB or lightheadedness with hypotension: Yes Has patient had a PCN reaction causing severe rash involving mucus membranes or skin necrosis: No Has patient had a PCN reaction that required hospitalization No Has patient had a PCN reaction occurring within the last 10 years: No If all of the above answers are "NO", then may proceed with Cephalosporin use.       Review of Systems    DIABETES  Followed by PCP and treated with metformin twice a day at breakfast and dinner  A1c has been stable  Weight is slightly lower Blood sugars at home have been significantly mornings and are averaging about 140 in the mornings However a couple of times they were over 200 likely from eating more carbohydrate his breakfast Blood sugars are as low as 73 later in the day but not checking after dinner  She is trying to do an exercise bike and weight is slightly better   Lab Results  Component  Value Date   HGBA1C 6.6 (A) 06/27/2018   HGBA1C 6.6 (A) 03/26/2018   HGBA1C 6.0 (A) 01/24/2018   Lab Results  Component Value Date   MICROALBUR <0.7 01/17/2018   LDLCALC 82 03/26/2018   CREATININE 1.32 (H) 07/21/2018   HYPERTENSION: Followed by PCP She now says that periodically she feels lightheaded and about to faint and has to sit down Has not checked blood pressure at home standing   BP Readings from Last 3 Encounters:  09/16/18 120/75  07/21/18 (!) 158/90  06/27/18 140/68   Home blood pressure readings: Recently about 1 30/80   Examination:   BP 120/75 (Patient Position: Standing)   Pulse 97   Ht 5\' 4"  (1.626 m)   Wt 214 lb (97.1 kg)   SpO2 94%   BMI 36.73 kg/m        Assessment/Plan:   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-131  She is still requiring relatively small dose of levothyroxine Now with 75 mcg she is complaining of fatigue but this appears to be probably related to orthostatic symptoms   DIABETES with obesity: This is generally controlled on low-dose metformin  This is followed by her PCP with last A1c 6.6 She needs to try and cut back on starchy foods especially in the morning since she sometimes has readings over 200  Recommendations:  Check thyroid levels again today  Follow-up in 6 months if TSH results are normal  For her diabetes she can try taking metformin at lunch and bedtime and not in the morning However if she has more consistent high readings after meals she may benefit from a second drug She thinks she can try to be more consistent with diet also  She will continue to follow-up with PCP for diabetes  HYPERTENSION: Blood pressure is low normal standing However not clear if she is symptomatic from this She will check blood pressure standing and also follow-up with her PCP regarding her orthostatic weakness and lightheadedness In the meantime she can try splitting the losartan to half tablet twice a day instead of once in the morning   Patient Instructions  Take Losartan 1/2 am and 1/2 pm   Elayne Snare 09/16/2018, 4:38 PM

## 2018-09-24 ENCOUNTER — Telehealth: Payer: Self-pay | Admitting: Endocrinology

## 2018-09-24 NOTE — Telephone Encounter (Signed)
Labs printed and being faxed.

## 2018-09-24 NOTE — Telephone Encounter (Signed)
Patient stated she is having an MRI done and Dr Renita Papa is requesting the recent labs she had done in our office.   Patient would like this faxed to Dr Debbe Odea office  Fax- [phone] Attn: Truman Hayward

## 2018-09-24 NOTE — Telephone Encounter (Signed)
Labs have been faxed.

## 2018-09-30 ENCOUNTER — Ambulatory Visit (INDEPENDENT_AMBULATORY_CARE_PROVIDER_SITE_OTHER): Payer: Medicare Other | Admitting: Family Medicine

## 2018-09-30 ENCOUNTER — Other Ambulatory Visit: Payer: Self-pay

## 2018-09-30 ENCOUNTER — Encounter: Payer: Self-pay | Admitting: Family Medicine

## 2018-09-30 VITALS — BP 136/78 | HR 80 | Temp 97.8°F | Ht 64.0 in | Wt 213.9 lb

## 2018-09-30 DIAGNOSIS — N181 Chronic kidney disease, stage 1: Secondary | ICD-10-CM

## 2018-09-30 DIAGNOSIS — Z23 Encounter for immunization: Secondary | ICD-10-CM

## 2018-09-30 DIAGNOSIS — E1122 Type 2 diabetes mellitus with diabetic chronic kidney disease: Secondary | ICD-10-CM

## 2018-09-30 DIAGNOSIS — R42 Dizziness and giddiness: Secondary | ICD-10-CM | POA: Diagnosis not present

## 2018-09-30 LAB — POCT GLYCOSYLATED HEMOGLOBIN (HGB A1C): Hemoglobin A1C: 6.4 % — AB (ref 4.0–5.6)

## 2018-09-30 NOTE — Patient Instructions (Signed)
Let's reduce the Hyzaar to one half tablet daily  Stay well hydrated.    Let me know if you still have the dizziness after the above.

## 2018-09-30 NOTE — Progress Notes (Signed)
Subjective:     Patient ID: Dawn Salas, female   DOB: 07-25-1940, 78 y.o.   MRN: [id number]  HPI Patient here for medical follow-up.  She states that over the past few weeks she has had intermittent "weak "spells.  She thinks this may be related to Crestor.  She has not had any generalized myalgias.  She states that her lightheadedness tends to be worse after position change.  No syncope.  No chest pains.  No dyspnea.  She is on several blood pressure medications including amlodipine, metoprolol, Hyzaar.  She feels that she is hydrating fairly well.  Not monitoring blood pressure regularly.  She had recent CBC and CMP and these results were reviewed and basically stable.  Type 2 diabetes.  This has been well controlled.  She remains on metformin.  Denies any polyuria or polydipsia  Ongoing back difficulties.  She has had some recent lumbar pain and has scheduled MRI repeat.  She is followed by neurosurgeon for that.  She hopes to start some water exercises soon.  Past Medical History:  Diagnosis Date  . Allergy   . Anxiety   . Arthritis   . Asthma    patient denies   . Depression   . Diabetes mellitus    Type two  . Diverticulitis   . Family history of adverse reaction to anesthesia    daughter- N/V   . Frequency of urination   . GERD (gastroesophageal reflux disease)   . History of hiatal hernia   . Hyperglycemia   . Hyperlipidemia   . Hypertension   . Hyperthyroidism    PT HAD BIPOSY -NEGATIVE.Marland Kitchen AND NOW JUST GETS CHECKED EACH YEAR .Marland Kitchen 2013 LAST TEST   SEES DR. [name].   . Multiple meningiomas of spine and brain ([place])   . Obstructive sleep apnea    STUDY AT [facility] DOES NOT USE C PAP  . Palpitations   . Peptic ulcer disease   . Pneumonia    "walking pneumonia" hx of  . Seizures ([place])    one siezure with brain surgery none since 2015    Past Surgical History:  Procedure Laterality Date  . ABDOMINAL HYSTERECTOMY    . BACK SURGERY     L SPIN 2003   NECK 2004  . BRAIN  MENINGIOMA EXCISION  8/10   X 2  . C5-C6 neck fusion    . COLONOSCOPY    . CRANIOTOMY  08/13/2011   Procedure: CRANIOTOMY TUMOR EXCISION;  Surgeon: [name], MD;  Location: [facility];  Service: Neurosurgery;  Laterality: Bilateral;  Bifrontal craniotomy for tumor  . DILATION AND CURETTAGE OF UTERUS     YEARS AGO  . L4-L5 posterior la    . left foot plantar and hammertoe    . right foot bunectomy    . right knee arthroscopy     x 3  . right shoulder rotator cuff    . ROBOTIC ASSISTED BILATERAL SALPINGO OOPHERECTOMY N/A 08/25/2015   Procedure: XI ROBOTIC ASSISTED BILATERAL SALPINGO OOPHORECTOMY;  Surgeon: [name], MD;  Location: [facility];  Service: Gynecology;  Laterality: N/A;  . TONSILLECTOMY    . TUBAL LIGATION    . WRIST SURGERY Right     reports that she has never smoked. She has never used smokeless tobacco. She reports that she does not drink alcohol or use drugs. family history includes Anesthesia problems in her daughter; Arthritis in an other family member; Cancer in her mother; Diabetes in  an other family member; Heart disease (age of onset: 36) in her father; Hyperlipidemia in an other family member; Hypertension in an other family member. Allergies  Allergen Reactions  . Flagyl [Metronidazole] Other (See Comments)    Caused increased heart rate  . Aspirin Effervescent Swelling    Alka-Seltzer gel caps- sore mouth, face swollen, turned hands white  . Crestor [Rosuvastatin]   . Morphine Sulfate Hives    Hallucinations  . Penicillins Swelling and Rash    Has patient had a PCN reaction causing immediate rash, facial/tongue/throat swelling, SOB or lightheadedness with hypotension: Yes Has patient had a PCN reaction causing severe rash involving mucus membranes or skin necrosis: No Has patient had a PCN reaction that required hospitalization No Has patient had a PCN reaction occurring within the last 10 years: No If all of the above answers are "NO", then may  proceed with Cephalosporin use.      Review of Systems  Constitutional: Negative for appetite change, chills, fatigue, fever and unexpected weight change.  Eyes: Negative for visual disturbance.  Respiratory: Negative for cough, chest tightness, shortness of breath and wheezing.   Cardiovascular: Negative for chest pain, palpitations and leg swelling.  Gastrointestinal: Negative for abdominal pain.  Genitourinary: Negative for dysuria.  Neurological: Positive for dizziness and light-headedness. Negative for seizures, syncope, speech difficulty, weakness and headaches.       Objective:   Physical Exam Constitutional:      Appearance: She is well-developed.  Eyes:     Pupils: Pupils are equal, round, and reactive to light.  Neck:     Musculoskeletal: Neck supple.     Thyroid: No thyromegaly.     Vascular: No JVD.  Cardiovascular:     Rate and Rhythm: Normal rate and regular rhythm.     Heart sounds: No gallop.   Pulmonary:     Effort: Pulmonary effort is normal. No respiratory distress.     Breath sounds: Normal breath sounds. No wheezing or rales.  Musculoskeletal:     Right lower leg: No edema.     Left lower leg: No edema.  Neurological:     Mental Status: She is alert.        Assessment:     #1 hypertension stable.  She does not have any orthostatic change with seated blood pressure 115/68 and standing 114/68  #2 recent lightheadedness.  Question related to blood pressure.  Even though she is not orthostatic today her symptoms do seem to be very transient and positional We explained that we do not think her symptoms are related to the Crestor  #3 type 2 diabetes well controlled with A1c 6.4%  #4 hypothyroidism followed by endocrinology    Plan:     -Flu vaccine given -Reduce Hyzaar 100/12.5 mg to 1/2 tablet daily -Touch base if dizziness not resolving next couple of weeks -Routine follow-up in 3 months and sooner as needed  Eulas Post MD [place]  [facility] at [facility]

## 2018-10-11 ENCOUNTER — Other Ambulatory Visit: Payer: Self-pay | Admitting: Family Medicine

## 2018-10-16 ENCOUNTER — Other Ambulatory Visit: Payer: Self-pay | Admitting: Endocrinology

## 2018-11-10 ENCOUNTER — Other Ambulatory Visit: Payer: Self-pay | Admitting: Family Medicine

## 2018-11-17 ENCOUNTER — Other Ambulatory Visit: Payer: Self-pay | Admitting: Family Medicine

## 2018-12-29 ENCOUNTER — Other Ambulatory Visit: Payer: Self-pay

## 2018-12-30 ENCOUNTER — Ambulatory Visit (INDEPENDENT_AMBULATORY_CARE_PROVIDER_SITE_OTHER): Payer: Medicare Other | Admitting: Family Medicine

## 2018-12-30 ENCOUNTER — Encounter: Payer: Self-pay | Admitting: Family Medicine

## 2018-12-30 ENCOUNTER — Other Ambulatory Visit: Payer: Self-pay

## 2018-12-30 VITALS — BP 128/74 | HR 75 | Temp 97.0°F | Ht 64.0 in | Wt 214.0 lb

## 2018-12-30 DIAGNOSIS — N181 Chronic kidney disease, stage 1: Secondary | ICD-10-CM

## 2018-12-30 DIAGNOSIS — E1122 Type 2 diabetes mellitus with diabetic chronic kidney disease: Secondary | ICD-10-CM | POA: Diagnosis not present

## 2018-12-30 LAB — POCT GLYCOSYLATED HEMOGLOBIN (HGB A1C): Hemoglobin A1C: 6.3 % — AB (ref 4.0–5.6)

## 2018-12-30 NOTE — Progress Notes (Signed)
Subjective:     Patient ID: Dawn Salas, female   DOB: 1940/12/13, 78 y.o.   MRN: [id number]  HPI Kenza is here for follow-up regarding diabetes.  This is generally been well controlled.  Last A1c was 6.4%.  Fasting blood sugars have consistently been less than 130.  She remains on Metformin.  No polyuria or polydipsia.  She is getting regular eye exams and will be due again in January for that.  She denies any foot problems.  She has hyperlipidemia treated with Crestor and lipids were stable last spring.  Flu vaccine already given.  Past Medical History:  Diagnosis Date  . Allergy   . Anxiety   . Arthritis   . Asthma    patient denies   . Depression   . Diabetes mellitus    Type two  . Diverticulitis   . Family history of adverse reaction to anesthesia    daughter- N/V   . Frequency of urination   . GERD (gastroesophageal reflux disease)   . History of hiatal hernia   . Hyperglycemia   . Hyperlipidemia   . Hypertension   . Hyperthyroidism    PT HAD BIPOSY -NEGATIVE.Marland Kitchen AND NOW JUST GETS CHECKED EACH YEAR .Marland Kitchen 2013 LAST TEST   SEES DR. [name].   . 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 Obstructive sleep apnea    STUDY AT [facility] DOES NOT USE C PAP  . Palpitations   . Peptic ulcer disease   . Pneumonia    "walking pneumonia" hx of  . Seizures ([place])    one siezure with brain surgery none since 2015    Past Surgical History:  Procedure Laterality Date  . ABDOMINAL HYSTERECTOMY    . BACK SURGERY     L SPIN 2003   NECK 2004  . BRAIN MENINGIOMA EXCISION  8/10   X 2  . C5-C6 neck fusion    . COLONOSCOPY    . CRANIOTOMY  08/13/2011   Procedure: CRANIOTOMY TUMOR EXCISION;  Surgeon: [name], MD;  Location: [facility];  Service: Neurosurgery;  Laterality: Bilateral;  Bifrontal craniotomy for tumor  . DILATION AND CURETTAGE OF UTERUS     YEARS AGO  . L4-L5 posterior la    . left foot plantar and hammertoe    . right foot bunectomy    . right knee  arthroscopy     x 3  . right shoulder rotator cuff    . ROBOTIC ASSISTED BILATERAL SALPINGO OOPHERECTOMY N/A 08/25/2015   Procedure: XI ROBOTIC ASSISTED BILATERAL SALPINGO OOPHORECTOMY;  Surgeon: [name], MD;  Location: [facility];  Service: Gynecology;  Laterality: N/A;  . TONSILLECTOMY    . TUBAL LIGATION    . WRIST SURGERY Right     reports that she has never smoked. She has never used smokeless tobacco. She reports that she does not drink alcohol or use drugs. family history includes Anesthesia problems in her daughter; Arthritis in an other family member; Cancer in her mother; Diabetes in an other family member; Heart disease (age of onset: 42) in her father; Hyperlipidemia in an other family member; Hypertension in an other family member. Allergies  Allergen Reactions  . Flagyl [Metronidazole] Other (See Comments)    Caused increased heart rate  . Aspirin Effervescent Swelling    Alka-Seltzer gel caps- sore mouth, face swollen, turned hands white  . Crestor [Rosuvastatin]   . Morphine Sulfate Hives    Hallucinations  . Penicillins  Swelling and Rash    Has patient had a PCN reaction causing immediate rash, facial/tongue/throat swelling, SOB or lightheadedness with hypotension: Yes Has patient had a PCN reaction causing severe rash involving mucus membranes or skin necrosis: No Has patient had a PCN reaction that required hospitalization No Has patient had a PCN reaction occurring within the last 10 years: No If all of the above answers are "NO", then may proceed with Cephalosporin use.      Review of Systems  Constitutional: Negative for fatigue and unexpected weight change.  Eyes: Negative for visual disturbance.  Respiratory: Negative for cough, chest tightness, shortness of breath and wheezing.   Cardiovascular: Negative for chest pain, palpitations and leg swelling.  Endocrine: Negative for polydipsia and polyuria.  Neurological: Negative for dizziness, seizures,  syncope, weakness, light-headedness and headaches.       Objective:   Physical Exam Constitutional:      Appearance: She is well-developed.  Eyes:     Pupils: Pupils are equal, round, and reactive to light.  Neck:     Musculoskeletal: Neck supple.     Thyroid: No thyromegaly.     Vascular: No JVD.  Cardiovascular:     Rate and Rhythm: Normal rate and regular rhythm.     Heart sounds: No gallop.   Pulmonary:     Effort: Pulmonary effort is normal. No respiratory distress.     Breath sounds: Normal breath sounds. No wheezing or rales.  Musculoskeletal:     Right lower leg: No edema.     Left lower leg: No edema.  Skin:    Comments: Feet reveal no skin lesions. Good distal foot pulses. Good capillary refill. No calluses. Normal sensation with monofilament testing   Neurological:     Mental Status: She is alert.        Assessment:     Type 2 diabetes well controlled with A1c 6.3%    Plan:     -Recommend routine follow-up in about 4 months. -Continue to stay active with regular exercise.  She seems to be doing a good job with restriction in high glycemic foods. -Reminder for eye exam this coming spring -She had questions about possible discontinuation of Metformin.  We explained if her A1c gets below 6 would give that some consideration  Eulas Post MD [place] [facility] at [facility]

## 2019-01-22 ENCOUNTER — Other Ambulatory Visit: Payer: Self-pay

## 2019-01-23 HISTORY — PX: OTHER SURGICAL HISTORY: SHX169

## 2019-01-24 ENCOUNTER — Other Ambulatory Visit: Payer: Self-pay | Admitting: Endocrinology

## 2019-01-25 ENCOUNTER — Other Ambulatory Visit: Payer: Self-pay | Admitting: Family Medicine

## 2019-01-26 ENCOUNTER — Encounter: Payer: Self-pay | Admitting: Endocrinology

## 2019-01-26 ENCOUNTER — Ambulatory Visit (INDEPENDENT_AMBULATORY_CARE_PROVIDER_SITE_OTHER): Payer: Medicare Other | Admitting: Endocrinology

## 2019-01-26 VITALS — BP 128/88 | HR 78 | Temp 98.6°F | Ht 64.0 in | Wt 211.8 lb

## 2019-01-26 DIAGNOSIS — E7800 Pure hypercholesterolemia, unspecified: Secondary | ICD-10-CM | POA: Diagnosis not present

## 2019-01-26 DIAGNOSIS — I10 Essential (primary) hypertension: Secondary | ICD-10-CM

## 2019-01-26 DIAGNOSIS — E890 Postprocedural hypothyroidism: Secondary | ICD-10-CM

## 2019-01-26 LAB — LIPID PANEL
Cholesterol: 198 mg/dL (ref 0–200)
HDL: 86.3 mg/dL (ref >39.00)
LDL Cholesterol: 96 mg/dL (ref 0–99)
NonHDL: 111.8
Total CHOL/HDL Ratio: 2
Triglycerides: 78 mg/dL (ref 0.0–149.0)
VLDL: 15.6 mg/dL (ref 0.0–40.0)

## 2019-01-26 LAB — COMPREHENSIVE METABOLIC PANEL
ALT: 11 U/L (ref 0–35)
AST: 19 U/L (ref 0–37)
Albumin: 4.4 g/dL (ref 3.5–5.2)
Alkaline Phosphatase: 83 U/L (ref 39–117)
BUN: 14 mg/dL (ref 6–23)
CO2: 26 mEq/L (ref 19–32)
Calcium: 9.4 mg/dL (ref 8.4–10.5)
Chloride: 101 mEq/L (ref 96–112)
Creatinine, Ser: 1.26 mg/dL — ABNORMAL HIGH (ref 0.40–1.20)
GFR: 49.93 mL/min — ABNORMAL LOW (ref >60.00)
Glucose, Bld: 96 mg/dL (ref 70–99)
Potassium: 3.5 mEq/L (ref 3.5–5.1)
Sodium: 139 mEq/L (ref 135–145)
Total Bilirubin: 0.6 mg/dL (ref 0.2–1.2)
Total Protein: 6.8 g/dL (ref 6.0–8.3)

## 2019-01-26 LAB — T4, FREE: Free T4: 1.27 ng/dL (ref 0.60–1.60)

## 2019-01-26 LAB — TSH: TSH: 1.06 u[IU]/mL (ref 0.35–4.50)

## 2019-01-26 NOTE — Progress Notes (Signed)
Patient ID: Dawn Salas, female   DOB: [birth year], 79 y.o.   MRN: [id number]                                                                                                                  Chief complaint: Endocrinology follow-up   History of Present Illness:   History obtained on initial consultation:  She was diagnosed as having Hyperthyroidism in 2015 when she was having palpitations and was seen to have a goiter. At that time she was having the usual symptoms of fatigue, shakiness, palpitations, heat intolerance and some weight loss No details of this are available She was treated with I-131, unknown dose which was prescribed in March 2015 Apparently she had a large hot nodule on the left, however details of her treatment are not available  Apparently subsequently her hyperthyroidism recurred and methimazole restarted ?  In 4/17 Also the dose was increased up to 7.5 mg in 10/2015  Apparently prior to her being treated for the hot nodule she had a multinodular goiter evaluated with a fine-needle aspiration in 09/2010  Since she was significantly hyperthyroid on her initial exam in February and free T3 was 5.0 and was treated with methimazole Because of persistent hyperthyroidism and very high thyrotropin receptor antibody she was given I-131 treatment with 20.3 mCi on 06/04/16  RECENT history:  She has been HYPOTHYROID as of 07/2016 with a low free T4 level  She was also having some fatigue and cold sensitivity as well as the muscle cramps With starting levothyroxine 125 g she felt better, however subsequently TSH was low again the dose was reduced down to 100 g  Her last visit was in 08/2018 As before she periodically gets fatigued but not consistently Her weight is 3 pounds less recently Does not complain of any cold intolerance but also no palpitations or shakiness  She is taking 75g of levothyroxine daily since 6/20 She takes this very regularly before breakfast  daily and has not missed any doses  She takes her Geritol with iron after breakfast which is at least 30 minutes later and has been doing this for a while  Wt Readings from Last 3 Encounters:  01/26/19 211 lb 12.8 oz (96.1 kg)  12/30/18 214 lb (97.1 kg)  09/30/18 213 lb 14.4 oz (97 kg)     Thyroid function tests as follows:     Lab Results  Component Value Date   FREET4 1.23 09/16/2018   FREET4 1.10 07/21/2018   FREET4 0.95 01/17/2018   T3FREE 3.3 03/21/2017   T3FREE 3.1 07/06/2016   T3FREE 3.6 05/23/2016   TSH 0.37 09/16/2018   TSH 0.17 (L) 07/21/2018   TSH 1.61 01/17/2018    Lab Results  Component Value Date   THYROTRECAB 87.70 (H) 04/11/2016   Ophthalmopathy:  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 with wearing prism lenses Has only minimal blurring of the vision No prominence of the eyes or dryness, followed by ophthalmologist regularly  DIABETES: See review of systems  Allergies as of 01/26/2019      Reactions   Flagyl [metronidazole] Other (See Comments)   Caused increased heart rate   Aspirin Effervescent Swelling   Alka-Seltzer gel caps- sore mouth, face swollen, turned hands white   Crestor [rosuvastatin]    Morphine Sulfate Hives   Hallucinations   Penicillins Swelling, Rash   Has patient had a PCN reaction causing immediate rash, facial/tongue/throat swelling, SOB or lightheadedness with hypotension: Yes Has patient had a PCN reaction causing severe rash involving mucus membranes or skin necrosis: No Has patient had a PCN reaction that required hospitalization No Has patient had a PCN reaction occurring within the last 10 years: No If all of the above answers are "NO", then may proceed with Cephalosporin use.      Medication List       Accurate as of January 26, 2019 10:14 AM. If you have any questions, ask your nurse or doctor.        Accu-Chek Aviva Plus test strip Generic drug: glucose blood TEST BLOOD SUGAR TWICE DAILY.    accu-chek softclix lancets 1 each by Other route 2 (two) times daily. E11.9   Accu-Chek Softclix Lancets lancets TEST BLOOD SUGAR TWICE DAILY.   amLODipine 5 MG tablet Commonly known as: NORVASC TAKE ONE (1) TABLET EACH DAY   cetirizine 10 MG tablet Commonly known as: ZYRTEC Take 10 mg by mouth daily as needed. For allergies   furosemide 40 MG tablet Commonly known as: LASIX Take 0.5 tablets (20 mg total) by mouth daily as needed. For fluid   GERITOL COMPLETE PO Take 1 tablet by mouth daily.   levothyroxine 75 MCG tablet Commonly known as: SYNTHROID TAKE ONE (1) TABLET EACH DAY   losartan-hydrochlorothiazide 100-12.5 MG tablet Commonly known as: HYZAAR TAKE 1 TABLET DAILY What changed:   how much to take  how to take this  when to take this  additional instructions   Magnesium 250 MG Tabs Take 250 mg by mouth daily.   meclizine 25 MG tablet Commonly known as: ANTIVERT Take 25 mg by mouth 3 (three) times daily as needed for dizziness.   metFORMIN 500 MG tablet Commonly known as: GLUCOPHAGE TAKE ONE TABLET TWICE A DAY WITH FOOD   metoprolol succinate 100 MG 24 hr tablet Commonly known as: TOPROL-XL TAKE ONE (1) TABLET EACH DAY   potassium chloride 10 MEQ tablet Commonly known as: KLOR-CON TAKE ONE (1) TABLET EACH DAY   promethazine 25 MG tablet Commonly known as: PHENERGAN Take 1 tablet (25 mg total) by mouth every 6 (six) hours as needed for nausea or vomiting.   RABEprazole 20 MG tablet Commonly known as: ACIPHEX TAKE ONE (1) TABLET EACH DAY   rosuvastatin 40 MG tablet Commonly known as: CRESTOR TAKE ONE (1) TABLET EACH DAY   solifenacin 5 MG tablet Commonly known as: VESICARE TAKE ONE (1) TABLET EACH DAY   valACYclovir 1000 MG tablet Commonly known as: VALTREX TAKE 2 TABS TWICE DAILY FOR 1 DAY THEN AS NEEDED   VITAMIN B12 PO Take 1 mL by mouth daily. OTC           Past Medical History:  Diagnosis Date  . Allergy   . Anxiety   .  Arthritis   . Asthma    patient denies   . Depression   . Diabetes mellitus    Type two  . Diverticulitis   . Family history of adverse reaction to anesthesia    daughter- N/V   .  Frequency of urination   . GERD (gastroesophageal reflux disease)   . History of hiatal hernia   . Hyperglycemia   . Hyperlipidemia   . Hypertension   . Hyperthyroidism    PT HAD BIPOSY -NEGATIVE.Marland Kitchen AND NOW JUST GETS CHECKED EACH YEAR .Marland Kitchen 2013 LAST TEST   SEES DR. [name].   . Multiple meningiomas of spine and brain ([facility])   . Obstructive sleep apnea    STUDY AT [facility] DOES NOT USE C PAP  . Palpitations   . Peptic ulcer disease   . Pneumonia    "walking pneumonia" hx of  . Seizures ([place])    one siezure with brain surgery none since 2015     Past Surgical History:  Procedure Laterality Date  . ABDOMINAL HYSTERECTOMY    . BACK SURGERY     L SPIN 2003   NECK 2004  . BRAIN MENINGIOMA EXCISION  8/10   X 2  . C5-C6 neck fusion    . COLONOSCOPY    . CRANIOTOMY  08/13/2011   Procedure: CRANIOTOMY TUMOR EXCISION;  Surgeon: [name], MD;  Location: [facility];  Service: Neurosurgery;  Laterality: Bilateral;  Bifrontal craniotomy for tumor  . DILATION AND CURETTAGE OF UTERUS     YEARS AGO  . L4-L5 posterior la    . left foot plantar and hammertoe    . right foot bunectomy    . right knee arthroscopy     x 3  . right shoulder rotator cuff    . ROBOTIC ASSISTED BILATERAL SALPINGO OOPHERECTOMY N/A 08/25/2015   Procedure: XI ROBOTIC ASSISTED BILATERAL SALPINGO OOPHORECTOMY;  Surgeon: [name], MD;  Location: [facility];  Service: Gynecology;  Laterality: N/A;  . TONSILLECTOMY    . TUBAL LIGATION    . WRIST SURGERY Right     Family History  Problem Relation Age of Onset  . Cancer Mother        ovarian  . Heart disease Father 23  . Arthritis Other   . Hyperlipidemia Other   . Hypertension Other   . Diabetes Other   . Anesthesia problems Daughter        NAUSEA AND VOMITING POST OP  .  Breast cancer Neg Hx     Social History:  reports that she has never smoked. She has never used smokeless tobacco. She reports that she does not drink alcohol or use drugs.  Allergies:  Allergies  Allergen Reactions  . Flagyl [Metronidazole] Other (See Comments)    Caused increased heart rate  . Aspirin Effervescent Swelling    Alka-Seltzer gel caps- sore mouth, face swollen, turned hands white  . Crestor [Rosuvastatin]   . Morphine Sulfate Hives    Hallucinations  . Penicillins Swelling and Rash    Has patient had a PCN reaction causing immediate rash, facial/tongue/throat swelling, SOB or lightheadedness with hypotension: Yes Has patient had a PCN reaction causing severe rash involving mucus membranes or skin necrosis: No Has patient had a PCN reaction that required hospitalization No Has patient had a PCN reaction occurring within the last 10 years: No If all of the above answers are "NO", then may proceed with Cephalosporin use.       Review of Systems    DIABETES  Followed by PCP and treated with metformin twice a day at breakfast and dinner Notes from her last PCP visit were reviewed along with labs including A1c  A1c has been stable and is now slightly better at 6.3  as of 12/20  Weight is down about 2 to 3 pounds recently She checks her blood sugars very erratically and only yesterday and today in the last 2 weeks Yesterday she felt a little weak and shaky when her blood sugar was 83 in the early afternoon but does not remember what she ate  She sometimes will get on her exercise bike as long as she has no back pain   Lab Results  Component Value Date   HGBA1C 6.3 (A) 12/30/2018   HGBA1C 6.4 (A) 09/30/2018   HGBA1C 6.6 (A) 06/27/2018   Lab Results  Component Value Date   MICROALBUR <0.7 01/17/2018   LDLCALC 82 03/26/2018   CREATININE 1.22 (H) 09/16/2018   HYPERTENSION: Followed by PCP  Previously was having some orthostatic symptoms   BP Readings from  Last 3 Encounters:  01/26/19 128/88  12/30/18 128/74  09/30/18 136/78   Home blood pressure readings have been monitored also   Examination:   BP 128/88 (BP Location: Left Arm, Patient Position: Sitting, Cuff Size: Normal)   Pulse 78   Temp 98.6 F (37 C)   Ht 5\' 4"  (1.626 m)   Wt 211 lb 12.8 oz (96.1 kg)   SpO2 99%   BMI 36.36 kg/m   No pedal edema present    Assessment/Plan:  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-131  She is on relatively small doses of levothyroxine for her weight She has had low normal TSH levels without symptoms However labs will need to be checked again today She has been consistent with taking her levothyroxine before breakfast daily although she does take some iron vitamins after breakfast   DIABETES with obesity: Although her blood sugars are well controlled as judged by A1c her fasting readings are around 150-160 She appears to have reactive hypoglycemia midday or afternoon and may be from taking her Metformin in the morning  Her weight is slightly better and she does try to exercise as much as possible   Recommendations:  Check thyroid levels and decide on dosage Considering her age will make sure her TSH is not low normal  Follow-up in 6 months if TSH results are normal  As previously discussed she does not need to take her Metformin the morning because of her symptoms of reactive hypoglycemia midday  She can try taking both tablets at dinnertime with which she will have better readings in the morning and also needs to check more readings after dinner and discussed blood sugar targets at various times  Labs for her hyperlipidemia and microalbumin will be drawn and forwarded to PCP, discussed LDL target  Discussed safety and efficacy of Covid vaccine and reassured her that there is enough data available for her to take this we will be able Afro-American ethnicity     There are no Patient Instructions on file for this  visit.  Elayne Snare 01/26/2019, 10:14 AM

## 2019-01-26 NOTE — Patient Instructions (Signed)
Take both Metformin at dinner  Check blood sugars on waking up 2-3  days a week  Also check blood sugars about 2 hours after meals and do this after different meals by rotation  Recommended blood sugar levels on waking up are 90-130 and about 2 hours after meal is 130-160  Please bring your blood sugar monitor to each visit, thank you

## 2019-02-07 ENCOUNTER — Other Ambulatory Visit: Payer: Self-pay | Admitting: Family Medicine

## 2019-02-10 LAB — HM DIABETES EYE EXAM

## 2019-02-23 ENCOUNTER — Other Ambulatory Visit: Payer: Self-pay | Admitting: Family Medicine

## 2019-03-31 ENCOUNTER — Other Ambulatory Visit: Payer: Self-pay

## 2019-03-31 ENCOUNTER — Telehealth: Payer: Self-pay | Admitting: Family Medicine

## 2019-03-31 MED ORDER — LOSARTAN POTASSIUM-HCTZ 100-12.5 MG PO TABS: 1.0000 | ORAL_TABLET | Freq: Every day | ORAL | 3 refills | Status: DC

## 2019-03-31 NOTE — Telephone Encounter (Signed)
Medication has been sent to the pharmacy requested. °

## 2019-03-31 NOTE — Telephone Encounter (Signed)
pt requesting a refill for  losartan-hydrochlorothiazide (HYZAAR) 100-12.5 MG tablet [facility], [place] [facility] [place]  Phone:  [phone] Fax:  [phone]  contact  number for the patient 336 [phone]

## 2019-04-17 ENCOUNTER — Other Ambulatory Visit: Payer: Self-pay | Admitting: Neurosurgery

## 2019-04-17 ENCOUNTER — Other Ambulatory Visit: Payer: Self-pay | Admitting: Physical Medicine and Rehabilitation

## 2019-04-17 ENCOUNTER — Telehealth: Payer: Self-pay | Admitting: Nurse Practitioner

## 2019-04-17 DIAGNOSIS — M545 Low back pain, unspecified: Secondary | ICD-10-CM

## 2019-04-17 DIAGNOSIS — G8929 Other chronic pain: Secondary | ICD-10-CM

## 2019-04-17 NOTE — Telephone Encounter (Signed)
Phone call to patient to verify medication list and allergies for myelogram procedure. Pt aware she will not need to hold any medications for this procedure. Pre and post procedure instructions reviewed with pt. Pt verbalized understanding. 

## 2019-04-24 ENCOUNTER — Ambulatory Visit
Admission: RE | Admit: 2019-04-24 | Discharge: 2019-04-24 | Disposition: A | Discharge status: Home / Self Care | Payer: Medicare Other | Source: Ambulatory Visit | Attending: Neurosurgery | Admitting: Neurosurgery

## 2019-04-24 ENCOUNTER — Other Ambulatory Visit: Payer: Self-pay

## 2019-04-24 DIAGNOSIS — G8929 Other chronic pain: Secondary | ICD-10-CM

## 2019-04-24 MED ORDER — IOPAMIDOL (ISOVUE-M 200) INJECTION 41%
18.0000 mL | Freq: Once | INTRAMUSCULAR | Status: AC
  Administered 2019-04-24: 18 mL via INTRATHECAL

## 2019-04-24 MED ORDER — DIAZEPAM 5 MG PO TABS
5.0000 mg | ORAL_TABLET | Freq: Once | ORAL | Status: AC
  Administered 2019-04-24: 5 mg via ORAL

## 2019-04-24 NOTE — Discharge Instructions (Signed)

## 2019-04-27 ENCOUNTER — Encounter: Payer: Self-pay | Admitting: Gastroenterology

## 2019-04-27 ENCOUNTER — Other Ambulatory Visit: Payer: Self-pay | Admitting: Endocrinology

## 2019-04-30 ENCOUNTER — Other Ambulatory Visit: Payer: Self-pay

## 2019-05-01 ENCOUNTER — Encounter: Payer: Self-pay | Admitting: Family Medicine

## 2019-05-01 ENCOUNTER — Ambulatory Visit (INDEPENDENT_AMBULATORY_CARE_PROVIDER_SITE_OTHER): Payer: Medicare Other | Admitting: Family Medicine

## 2019-05-01 VITALS — BP 122/68 | HR 77 | Temp 98.2°F | Wt 215.5 lb

## 2019-05-01 DIAGNOSIS — N1831 Chronic kidney disease, stage 3a: Secondary | ICD-10-CM

## 2019-05-01 DIAGNOSIS — E1122 Type 2 diabetes mellitus with diabetic chronic kidney disease: Secondary | ICD-10-CM

## 2019-05-01 DIAGNOSIS — N181 Chronic kidney disease, stage 1: Secondary | ICD-10-CM

## 2019-05-01 LAB — POCT GLYCOSYLATED HEMOGLOBIN (HGB A1C): Hemoglobin A1C: 6.1 % — AB (ref 4.0–5.6)

## 2019-05-01 NOTE — Progress Notes (Signed)
Subjective:     Patient ID: Dawn Salas, female   DOB: Mar 30, 1940, 79 y.o.   MRN: [id number]  HPI   Dawn Salas is here for medical follow-up.  She has history of type 2 diabetes and hypertension as well as hypothyroidism.  She had recurrent benign meningiomas and reportedly had recent MRI brain per her neurosurgeon.  She has not heard back yet.  She also had recent myelogram of her lumbar spine for some chronic intermittent low back pain.  Her blood sugars have been stable by home readings.  Last A1c was well controlled.  She had recent GFR 49 which was in the ballpark of where she has been for some time.  Currently not exercising regularly.  She had Moderna vaccines January 12 and February 22.  Past Medical History:  Diagnosis Date  . Allergy   . Anxiety   . Arthritis   . Asthma    patient denies   . Depression   . Diabetes mellitus    Type two  . Diverticulitis   . Family history of adverse reaction to anesthesia    daughter- N/V   . Frequency of urination   . GERD (gastroesophageal reflux disease)   . History of hiatal hernia   . Hyperglycemia   . Hyperlipidemia   . Hypertension   . Hyperthyroidism    PT HAD BIPOSY -NEGATIVE.Marland Kitchen AND NOW JUST GETS CHECKED EACH YEAR .Marland Kitchen 2013 LAST TEST   SEES DR. [name].   . Multiple meningiomas of spine and brain ([place])   . Obstructive sleep apnea    STUDY AT [facility] DOES NOT USE C PAP  . Palpitations   . Peptic ulcer disease   . Pneumonia    "walking pneumonia" hx of  . Seizures ([place])    one siezure with brain surgery none since 2015    Past Surgical History:  Procedure Laterality Date  . ABDOMINAL HYSTERECTOMY    . BACK SURGERY     L SPIN 2003   NECK 2004  . BRAIN MENINGIOMA EXCISION  8/10   X 2  . C5-C6 neck fusion    . COLONOSCOPY    . CRANIOTOMY  08/13/2011   Procedure: CRANIOTOMY TUMOR EXCISION;  Surgeon: Dawn Charter, MD;  Location: [facility];  Service: Neurosurgery;  Laterality: Bilateral;  Bifrontal craniotomy for tumor   . DILATION AND CURETTAGE OF UTERUS     YEARS AGO  . L4-L5 posterior la    . left foot plantar and hammertoe    . right foot bunectomy    . right knee arthroscopy     x 3  . right shoulder rotator cuff    . ROBOTIC ASSISTED BILATERAL SALPINGO OOPHERECTOMY N/A 08/25/2015   Procedure: XI ROBOTIC ASSISTED BILATERAL SALPINGO OOPHORECTOMY;  Surgeon: Dawn Morning, MD;  Location: [facility];  Service: Gynecology;  Laterality: N/A;  . TONSILLECTOMY    . TUBAL LIGATION    . WRIST SURGERY Right     reports that she has never smoked. She has never used smokeless tobacco. She reports that she does not drink alcohol or use drugs. family history includes Anesthesia problems in her daughter; Arthritis in an other family member; Cancer in her mother; Diabetes in an other family member; Heart disease (age of onset: 40) in her father; Hyperlipidemia in an other family member; Hypertension in an other family member. Allergies  Allergen Reactions  . Flagyl [Metronidazole] Other (See Comments)    Caused increased heart rate  . Aspirin  Effervescent Swelling    Alka-Seltzer gel caps- sore mouth, face swollen, turned hands white  . Crestor [Rosuvastatin]   . Morphine Sulfate Hives    Hallucinations  . Penicillins Swelling and Rash    Has patient had a PCN reaction causing immediate rash, facial/tongue/throat swelling, SOB or lightheadedness with hypotension: Yes Has patient had a PCN reaction causing severe rash involving mucus membranes or skin necrosis: No Has patient had a PCN reaction that required hospitalization No Has patient had a PCN reaction occurring within the last 10 years: No If all of the above answers are "NO", then may proceed with Cephalosporin use.      Review of Systems  Constitutional: Negative for fatigue and unexpected weight change.  Eyes: Negative for visual disturbance.  Respiratory: Negative for cough, chest tightness, shortness of breath and wheezing.   Cardiovascular:  Negative for chest pain, palpitations and leg swelling.  Endocrine: Negative for polydipsia and polyuria.  Neurological: Negative for dizziness, seizures, syncope, weakness, light-headedness and headaches.       Objective:   Physical Exam Vitals reviewed.  Constitutional:      Appearance: Normal appearance.  Cardiovascular:     Rate and Rhythm: Normal rate and regular rhythm.  Pulmonary:     Effort: Pulmonary effort is normal.     Breath sounds: Normal breath sounds.  Musculoskeletal:     Comments: No pitting edema  Neurological:     Mental Status: She is alert.        Assessment:     #1 type 2 diabetes well controlled with A1c today 6.1%  #2 hypertension stable and at goal  #3 chronic kidney disease stage IIIa    Plan:     -Continue current diabetes regimen.  -[facility]  -Discussed chronic kidney disease.  We discussed the importance of avoiding nonsteroidals and staying adequately hydrated.  Routine follow-up in 6 months and reassess renal function then  Dawn Post MD [facility]

## 2019-05-15 ENCOUNTER — Other Ambulatory Visit: Payer: Self-pay | Admitting: Family Medicine

## 2019-05-27 ENCOUNTER — Other Ambulatory Visit: Payer: Self-pay

## 2019-05-27 ENCOUNTER — Ambulatory Visit (AMBULATORY_SURGERY_CENTER): Payer: Self-pay | Admitting: *Deleted

## 2019-05-27 VITALS — Temp 97.1°F | Ht 64.0 in | Wt 216.0 lb

## 2019-05-27 DIAGNOSIS — Z1211 Encounter for screening for malignant neoplasm of colon: Secondary | ICD-10-CM

## 2019-05-27 MED ORDER — NA SULFATE-K SULFATE-MG SULF 17.5-3.13-1.6 GM/177ML PO SOLN: ORAL | 0 refills | Status: DC

## 2019-05-27 NOTE — Progress Notes (Signed)
Patient is here in-person for PV. Patient denies any allergies to eggs or soy. Patient denies any problems with anesthesia/sedation. Patient denies any oxygen use at home. Patient denies taking any diet/weight loss medications or blood thinners. Patient is not being treated for MRSA or C-diff. Patient is aware of our care-partner policy and 0000000 safety protocol. EMMI education assisgned to the patient for the procedure, this was explained and instructions given to patient.   Patient requested Suprep, not pills.

## 2019-06-09 ENCOUNTER — Other Ambulatory Visit: Payer: Self-pay | Admitting: Neurosurgery

## 2019-06-10 ENCOUNTER — Other Ambulatory Visit: Payer: Self-pay

## 2019-06-10 ENCOUNTER — Ambulatory Visit (AMBULATORY_SURGERY_CENTER): Payer: Medicare Other | Admitting: Gastroenterology

## 2019-06-10 ENCOUNTER — Encounter: Payer: Medicare Other | Admitting: Gastroenterology

## 2019-06-10 ENCOUNTER — Encounter: Payer: Self-pay | Admitting: Gastroenterology

## 2019-06-10 VITALS — BP 137/66 | HR 62 | Temp 96.9°F | Resp 13 | Ht 64.0 in | Wt 216.0 lb

## 2019-06-10 DIAGNOSIS — D122 Benign neoplasm of ascending colon: Secondary | ICD-10-CM

## 2019-06-10 DIAGNOSIS — D126 Benign neoplasm of colon, unspecified: Secondary | ICD-10-CM

## 2019-06-10 DIAGNOSIS — D123 Benign neoplasm of transverse colon: Secondary | ICD-10-CM

## 2019-06-10 DIAGNOSIS — Z1211 Encounter for screening for malignant neoplasm of colon: Secondary | ICD-10-CM

## 2019-06-10 DIAGNOSIS — D120 Benign neoplasm of cecum: Secondary | ICD-10-CM | POA: Diagnosis not present

## 2019-06-10 DIAGNOSIS — D125 Benign neoplasm of sigmoid colon: Secondary | ICD-10-CM

## 2019-06-10 MED ORDER — SODIUM CHLORIDE 0.9 % IV SOLN: 500.0000 mL | Freq: Once | INTRAVENOUS | Status: DC

## 2019-06-10 NOTE — Patient Instructions (Signed)
Thank you for allowing [place] to care for you today!  Await pathology results, approximately 1-2 weeks.  Will most likely  recommend another colonoscopy in one year based on the number of polyps you had this time.  Resume previous diet and medications today.  Return to your normal activities tomorrow.    YOU HAD AN ENDOSCOPIC PROCEDURE TODAY AT [facility]:   Refer to the procedure report that was given to you for any specific questions about what was found during the examination.  If the procedure report does not answer your questions, please call your gastroenterologist to clarify.  If you requested that your care partner not be given the details of your procedure findings, then the procedure report has been included in a sealed envelope for you to review at your convenience later.  YOU SHOULD EXPECT: Some feelings of bloating in the abdomen. Passage of more gas than usual.  Walking can help get rid of the air that was put into your GI tract during the procedure and reduce the bloating. If you had a lower endoscopy (such as a colonoscopy or flexible sigmoidoscopy) you may notice spotting of blood in your stool or on the toilet paper. If you underwent a bowel prep for your procedure, you may not have a normal bowel movement for a few days.  Please Note:  You might notice some irritation and congestion in your nose or some drainage.  This is from the oxygen used during your procedure.  There is no need for concern and it should clear up in a day or so.  SYMPTOMS TO REPORT IMMEDIATELY:   Following lower endoscopy (colonoscopy or flexible sigmoidoscopy):  Excessive amounts of blood in the stool  Significant tenderness or worsening of abdominal pains  Swelling of the abdomen that is new, acute  Fever of 100F or higher   For urgent or emergent issues, a gastroenterologist can be reached at any hour by calling [phone]. Do not use MyChart messaging for urgent concerns.     DIET:  We do recommend a small meal at first, but then you may proceed to your regular diet.  Drink plenty of fluids but you should avoid alcoholic beverages for 24 hours.  ACTIVITY:  You should plan to take it easy for the rest of today and you should NOT DRIVE or use heavy machinery until tomorrow (because of the sedation medicines used during the test).    FOLLOW UP: Our staff will call the number listed on your records 48-72 hours following your procedure to check on you and address any questions or concerns that you may have regarding the information given to you following your procedure. If we do not reach you, we will leave a message.  We will attempt to reach you two times.  During this call, we will ask if you have developed any symptoms of COVID 19. If you develop any symptoms (ie: fever, flu-like symptoms, shortness of breath, cough etc.) before then, please call [phone].  If you test positive for Covid 19 in the 2 weeks post procedure, please call and report this information to [place].    If any biopsies were taken you will be contacted by phone or by letter within the next 1-3 weeks.  Please call us at [phone] if you have not heard about the biopsies in 3 weeks.    SIGNATURES/CONFIDENTIALITY: You and/or your care partner have signed paperwork which will be entered into your electronic medical record.  These  signatures attest to the fact that that the information above on your After Visit Summary has been reviewed and is understood.  Full responsibility of the confidentiality of this discharge information lies with you and/or your care-partner. 

## 2019-06-10 NOTE — Progress Notes (Signed)
Called to room to assist during endoscopic procedure.  Patient ID and intended procedure confirmed with present staff. Received instructions for my participation in the procedure from the performing physician.  

## 2019-06-10 NOTE — Progress Notes (Signed)
Vitals-DT Temp-JB  Pt's states no medical or surgical changes since previsit or office visit. 

## 2019-06-10 NOTE — Op Note (Signed)
[facility] Patient Name: Dawn Salas Procedure Date: 06/10/2019 7:23 AM MRN: [id number] Endoscopist: Viviann Spare P. Adela Lank , MD Age: 79 Referring MD:  Date of Birth: Mar 25, 1940 Gender: Female Account #: [id number] Procedure:                Colonoscopy Indications:              Screening for colorectal malignant neoplasm Medicines:                Monitored Anesthesia Care Procedure:                Pre-Anesthesia Assessment:                           - Prior to the procedure, a History and Physical                            was performed, and patient medications and                            allergies were reviewed. The patient's tolerance of                            previous anesthesia was also reviewed. The risks                            and benefits of the procedure and the sedation                            options and risks were discussed with the patient.                            All questions were answered, and informed consent                            was obtained. Prior Anticoagulants: The patient has                            taken no previous anticoagulant or antiplatelet                            agents. ASA Grade Assessment: III - A patient with                            severe systemic disease. After reviewing the risks                            and benefits, the patient was deemed in                            satisfactory condition to undergo the procedure.                           After obtaining informed consent, the colonoscope  was passed under direct vision. Throughout the                            procedure, the patient's blood pressure, pulse, and                            oxygen saturations were monitored continuously. The                            Colonoscope was introduced through the anus and                            advanced to the the cecum, identified by                            appendiceal orifice  and ileocecal valve. The                            colonoscopy was performed without difficulty. The                            patient tolerated the procedure well. The quality                            of the bowel preparation was adequate. The                            ileocecal valve, appendiceal orifice, and rectum                            were photographed. Scope In: 8:18:30 AM Scope Out: 8:59:10 AM Scope Withdrawal Time: 0 hours 34 minutes 8 seconds  Total Procedure Duration: 0 hours 40 minutes 40 seconds  Findings:                 The perianal and digital rectal examinations were                            normal.                           Two flat polyps were found in the cecum. The polyps                            were 3 to 10 mm in size. These polyps were removed                            with a cold snare. Resection and retrieval were                            complete.                           Six flat and sessile polyps were found in the  ascending colon. The polyps were 3 to 5 mm in size.                            These polyps were removed with a cold snare.                            Resection and retrieval were complete.                           Three sessile polyps were found in the hepatic                            flexure. The polyps were 3 to 4 mm in size. These                            polyps were removed with a cold snare. Resection                            and retrieval were complete.                           Two flat and sessile polyps were found in the                            transverse colon. The polyps were 3 to 5 mm in                            size. These polyps were removed with a cold snare.                            Resection and retrieval were complete.                           A 3 mm polyp was found in the sigmoid colon. The                            polyp was sessile. The polyp was removed with a                             cold snare. Resection and retrieval were complete.                           Many medium-mouthed diverticula were found in the                            sigmoid colon (severe) and ascending colon (mild).                           The colon was extremely tortuous which prolonged                            this exam.  The exam was otherwise without abnormality. Complications:            No immediate complications. Estimated blood loss:                            Minimal. Estimated Blood Loss:     Estimated blood loss was minimal. Impression:               - Two 3 to 10 mm polyps in the cecum, removed with                            a cold snare. Resected and retrieved.                           - Six 3 to 5 mm polyps in the ascending colon,                            removed with a cold snare. Resected and retrieved.                           - Three 3 to 4 mm polyps at the hepatic flexure,                            removed with a cold snare. Resected and retrieved.                           - Two 3 to 5 mm polyps in the transverse colon,                            removed with a cold snare. Resected and retrieved.                           - One 3 mm polyp in the sigmoid colon, removed with                            a cold snare. Resected and retrieved.                           - Diverticulosis in the sigmoid colon and in the                            ascending colon.                           - Tortuous colon.                           - The examination was otherwise normal. Recommendation:           - Patient has a contact number available for                            emergencies. The signs and symptoms of potential  delayed complications were discussed with the                            patient. Return to normal activities tomorrow.                            Written discharge instructions were provided to  the                            patient.                           - Resume previous diet.                           - Continue present medications.                           - Await pathology results. Viviann Spare P. Airel Magadan, MD 06/10/2019 9:05:22 AM This report has been signed electronically.

## 2019-06-10 NOTE — Progress Notes (Signed)
pt tolerated well. VSS. awake and to recovery. Report given to RN.  

## 2019-06-11 LAB — HM DIABETES EYE EXAM

## 2019-06-12 ENCOUNTER — Telehealth: Payer: Self-pay | Admitting: *Deleted

## 2019-06-12 NOTE — Telephone Encounter (Signed)
  Follow up Call-  Call back number 06/10/2019 04/24/2019  Post procedure Call Back phone  # [phone]  Some recent data might be hidden     Patient questions:  Do you have a fever, pain , or abdominal swelling? No. Pain Score  0 *  Have you tolerated food without any problems? Yes.    Have you been able to return to your normal activities? Yes.    Do you have any questions about your discharge instructions: Diet   No. Medications  No. Follow up visit  No.  Do you have questions or concerns about your Care? No.  Actions: * If pain score is 4 or above: No action needed, pain <4.  1. Have you developed a fever since your procedure? no  2.   Have you had an respiratory symptoms (SOB or cough) since your procedure? no  3.   Have you tested positive for COVID 19 since your procedure no  4.   Have you had any family members/close contacts diagnosed with the COVID 19 since your procedure?  no   If yes to any of these questions please route to Joylene John, RN and Erenest Rasher, RN

## 2019-06-16 ENCOUNTER — Encounter: Payer: Self-pay | Admitting: Endocrinology

## 2019-06-17 ENCOUNTER — Encounter: Payer: Self-pay | Admitting: Gastroenterology

## 2019-06-24 ENCOUNTER — Other Ambulatory Visit: Payer: Self-pay | Admitting: Family Medicine

## 2019-06-26 ENCOUNTER — Other Ambulatory Visit: Payer: Self-pay | Admitting: Family Medicine

## 2019-06-26 ENCOUNTER — Telehealth: Payer: Self-pay | Admitting: Gastroenterology

## 2019-06-26 DIAGNOSIS — Z1231 Encounter for screening mammogram for malignant neoplasm of breast: Secondary | ICD-10-CM

## 2019-06-26 NOTE — Progress Notes (Signed)
[facility], [place] [address] Phone: [phone] Fax: [phone]  [facility] [place], [place] AT [address] to [address] [place] [address] Phone: [phone] Fax: [place], [place] [address] Phone: [phone] Fax: [phone]      Your procedure is scheduled on July 02, 2019.  Report to [facility] Main Entrance "A" at 5:30 A.M., and check in at the Admitting office.  Call this number if you have problems the morning of surgery:  [phone]  Call [phone] if you have any questions prior to your surgery date Monday-Friday 8am-4pm    Remember:  Do not eat or drink after midnight the night before your surgery    Take these medicines the morning of surgery with A SIP OF WATER: amLODipine (NORVASC) levothyroxine (SYNTHROID)  metoprolol succinate (TOPROL-XL) Propylene Glycol (SYSTANE BALANCE) - eye drop RABEprazole (ACIPHEX)  rosuvastatin (CRESTOR) solifenacin (VESICARE)  As needed: acetaminophen (TYLENOL) meclizine (ANTIVERT)  valACYclovir (VALTREX)  As of today, STOP taking any Aspirin (unless otherwise instructed by your surgeon) and Aspirin containing products, Aleve, Naproxen, Ibuprofen, Motrin, Advil, Goody's, BC's, all herbal medications, fish oil, and all vitamins.   WHAT DO I DO ABOUT MY DIABETES MEDICATION?   Marland Kitchen Do not take oral diabetes medicines (pills) the morning of surgery.   HOW TO MANAGE YOUR DIABETES BEFORE AND AFTER SURGERY  Why is it important to control my blood sugar before and after surgery? . Improving blood sugar levels before and after surgery helps healing and can limit problems. . A way of improving blood sugar control is eating a healthy diet by: o  Eating less sugar and carbohydrates o  Increasing  activity/exercise o  Talking with your doctor about reaching your blood sugar goals . High blood sugars (greater than 180 mg/dL) can raise your risk of infections and slow your recovery, so you will need to focus on controlling your diabetes during the weeks before surgery. . Make sure that the doctor who takes care of your diabetes knows about your planned surgery including the date and location.  How do I manage my blood sugar before surgery? . Check your blood sugar at least 4 times a day, starting 2 days before surgery, to make sure that the level is not too high or low. . Check your blood sugar the morning of your surgery when you wake up and every 2 hours until you get to the Short Stay unit. o If your blood sugar is less than 70 mg/dL, you will need to treat for low blood sugar: - Do not take insulin. - Treat a low blood sugar (less than 70 mg/dL) with  cup of clear juice (cranberry or apple), 4 glucose tablets, OR glucose gel. - Recheck blood sugar in 15 minutes after treatment (to make sure it is greater than 70 mg/dL). If your blood sugar is not greater than 70 mg/dL on recheck, call [phone] for further instructions. . Report your blood sugar to the short stay nurse when you get to Short Stay.  . If you are admitted to the hospital after surgery: o Your blood sugar will be checked by the staff and you will probably be given insulin after surgery (instead of oral diabetes medicines) to make sure you have good blood sugar levels. o The goal for  blood sugar control after surgery is 80-180 mg/dL.                 Do not wear jewelry, make up, or nail polish            Do not wear lotions, powders, perfumes or deodorant.            Do not shave 48 hours prior to surgery.              Do not bring valuables to the hospital.            [facility] is not responsible for any belongings or valuables.  Do NOT Smoke (Tobacco/Vapping) or drink Alcohol 24 hours prior to your procedure If  you use a CPAP at night, you may bring all equipment for your overnight stay.   Contacts, glasses, dentures or bridgework may not be worn into surgery.      For patients admitted to the hospital, discharge time will be determined by your treatment team.   Patients discharged the day of surgery will not be allowed to drive home, and someone needs to stay with them for 24 hours.    Special instructions:   [place]- Preparing For Surgery  Before surgery, you can play an important role. Because skin is not sterile, your skin needs to be as free of germs as possible. You can reduce the number of germs on your skin by washing with CHG (chlorahexidine gluconate) Soap before surgery.  CHG is an antiseptic cleaner which kills germs and bonds with the skin to continue killing germs even after washing.    Oral Hygiene is also important to reduce your risk of infection.  Remember - BRUSH YOUR TEETH THE MORNING OF SURGERY WITH YOUR REGULAR TOOTHPASTE  Please do not use if you have an allergy to CHG or antibacterial soaps. If your skin becomes reddened/irritated stop using the CHG.  Do not shave (including legs and underarms) for at least 48 hours prior to first CHG shower. It is OK to shave your face.  Please follow these instructions carefully.   1. Shower the NIGHT BEFORE SURGERY and the MORNING OF SURGERY with CHG Soap.   2. If you chose to wash your hair, wash your hair first as usual with your normal shampoo.  3. After you shampoo, rinse your hair and body thoroughly to remove the shampoo.  4. Use CHG as you would any other liquid soap. You can apply CHG directly to the skin and wash gently with a scrungie or a clean washcloth.   5. Apply the CHG Soap to your body ONLY FROM THE NECK DOWN.  Do not use on open wounds or open sores. Avoid contact with your eyes, ears, mouth and genitals (private parts). Wash Face and genitals (private parts)  with your normal soap.   6. Wash thoroughly,  paying special attention to the area where your surgery will be performed.  7. Thoroughly rinse your body with warm water from the neck down.  8. DO NOT shower/wash with your normal soap after using and rinsing off the CHG Soap.  9. Pat yourself dry with a CLEAN TOWEL.  10. Wear CLEAN PAJAMAS to bed the night before surgery, wear comfortable clothes the morning of surgery  11. Place CLEAN SHEETS on your bed the night of your first shower and DO NOT SLEEP WITH PETS.   Day of Surgery:   Do not apply any deodorants/lotions.  Please wear clean clothes  to the hospital/surgery center.   Remember to brush your teeth WITH YOUR REGULAR TOOTHPASTE.   Please read over the following fact sheets that you were given.

## 2019-06-26 NOTE — Telephone Encounter (Signed)
Patient notified of the results.  All questions answered.  She will call back for any additional questions or concerns 

## 2019-06-26 NOTE — Telephone Encounter (Signed)
Patient called looking for path results. I advised her that a letter was sent out on 06/17/19. Will be resending it for her patient would like a call in the mean time.

## 2019-06-29 ENCOUNTER — Encounter (HOSPITAL_COMMUNITY): Payer: Self-pay

## 2019-06-29 ENCOUNTER — Other Ambulatory Visit (HOSPITAL_COMMUNITY)
Admission: RE | Admit: 2019-06-29 | Discharge: 2019-06-29 | Disposition: A | Discharge status: Home / Self Care | Payer: Medicare Other | Source: Ambulatory Visit | Attending: Neurosurgery | Admitting: Neurosurgery

## 2019-06-29 ENCOUNTER — Encounter (HOSPITAL_COMMUNITY)
Admission: RE | Admit: 2019-06-29 | Discharge: 2019-06-29 | Disposition: A | Discharge status: Home / Self Care | Payer: Medicare Other | Source: Ambulatory Visit | Attending: Neurosurgery | Admitting: Neurosurgery

## 2019-06-29 ENCOUNTER — Other Ambulatory Visit: Payer: Self-pay

## 2019-06-29 ENCOUNTER — Other Ambulatory Visit: Payer: Self-pay | Admitting: Neurosurgery

## 2019-06-29 DIAGNOSIS — Z20822 Contact with and (suspected) exposure to covid-19: Secondary | ICD-10-CM | POA: Insufficient documentation

## 2019-06-29 DIAGNOSIS — Z01818 Encounter for other preprocedural examination: Secondary | ICD-10-CM | POA: Insufficient documentation

## 2019-06-29 DIAGNOSIS — I10 Essential (primary) hypertension: Secondary | ICD-10-CM | POA: Insufficient documentation

## 2019-06-29 LAB — CBC
HCT: 42.1 % (ref 36.0–46.0)
Hemoglobin: 13.2 g/dL (ref 12.0–15.0)
MCH: 25.8 pg — ABNORMAL LOW (ref 26.0–34.0)
MCHC: 31.4 g/dL (ref 30.0–36.0)
MCV: 82.2 fL (ref 80.0–100.0)
Platelets: 212 10*3/uL (ref 150–400)
RBC: 5.12 MIL/uL — ABNORMAL HIGH (ref 3.87–5.11)
RDW: 14.7 % (ref 11.5–15.5)
WBC: 4.1 10*3/uL (ref 4.0–10.5)
nRBC: 0 % (ref 0.0–0.2)

## 2019-06-29 LAB — BASIC METABOLIC PANEL
Anion gap: 12 (ref 5–15)
BUN: 13 mg/dL (ref 8–23)
CO2: 27 mmol/L (ref 22–32)
Calcium: 8.9 mg/dL (ref 8.9–10.3)
Chloride: 101 mmol/L (ref 98–111)
Creatinine, Ser: 1.26 mg/dL — ABNORMAL HIGH (ref 0.44–1.00)
GFR calc Af Amer: 48 mL/min — ABNORMAL LOW (ref >60)
GFR calc non Af Amer: 41 mL/min — ABNORMAL LOW (ref >60)
Glucose, Bld: 100 mg/dL — ABNORMAL HIGH (ref 70–99)
Potassium: 4 mmol/L (ref 3.5–5.1)
Sodium: 140 mmol/L (ref 135–145)

## 2019-06-29 LAB — HEMOGLOBIN A1C
Hgb A1c MFr Bld: 7 % — ABNORMAL HIGH (ref 4.8–5.6)
Mean Plasma Glucose: 154.2 mg/dL

## 2019-06-29 LAB — TYPE AND SCREEN
ABO/RH(D): O POS
Antibody Screen: NEGATIVE

## 2019-06-29 LAB — SURGICAL PCR SCREEN
MRSA, PCR: NEGATIVE
Staphylococcus aureus: NEGATIVE

## 2019-06-29 LAB — SARS CORONAVIRUS 2 (TAT 6-24 HRS): SARS Coronavirus 2: NEGATIVE

## 2019-06-29 LAB — GLUCOSE, CAPILLARY: Glucose-Capillary: 117 mg/dL — ABNORMAL HIGH (ref 70–99)

## 2019-06-29 NOTE — Progress Notes (Signed)
[facility], [place] [address] Phone: [phone] Fax: [phone]  [facility] [place], [place] AT [address] to [address] [place] [address] Phone: [phone] Fax: [place], [place] [address] Phone: [phone] Fax: [phone]      Your procedure is scheduled on July 02, 2019.  Report to [facility] Main Entrance "A" at 5:30 A.M., and check in at the Admitting office.  Call this number if you have problems the morning of surgery:  [phone]  Call [phone] if you have any questions prior to your surgery date Monday-Friday 8am-4pm    Remember:  Do not eat or drink after midnight the night before your surgery    Take these medicines the morning of surgery with A SIP OF WATER:  AmLODipine (NORVASC)  Levothyroxine (SYNTHROID)  Metoprolol succinate (TOPROL-XL)  Propylene Glycol (SYSTANE BALANCE) - eye drop  RABEprazole (ACIPHEX)   Rosuvastatin (CRESTOR)  Solifenacin (VESICARE)   As needed:  Acetaminophen (TYLENOL)  Meclizine (ANTIVERT)   ValACYclovir (VALTREX)  As of today, STOP taking any Aspirin (unless otherwise instructed by your surgeon) and Aspirin containing products, Aleve, Naproxen, Ibuprofen, Motrin, Advil, Goody's, BC's, all herbal medications, fish oil, and all vitamins.   WHAT DO I DO ABOUT MY DIABETES MEDICATION?   Marland Kitchen Do not take oral diabetes medicines (pills) the morning of surgery.   HOW TO MANAGE YOUR DIABETES BEFORE AND AFTER SURGERY  Why is it important to control my blood sugar before and after surgery? . Improving blood sugar levels before and after surgery helps healing and can limit problems. . A way of improving blood sugar control is eating a healthy diet by: o  Eating less sugar and carbohydrates o  Increasing  activity/exercise o  Talking with your doctor about reaching your blood sugar goals . High blood sugars (greater than 180 mg/dL) can raise your risk of infections and slow your recovery, so you will need to focus on controlling your diabetes during the weeks before surgery. . Make sure that the doctor who takes care of your diabetes knows about your planned surgery including the date and location.  How do I manage my blood sugar before surgery? . Check your blood sugar at least 4 times a day, starting 2 days before surgery, to make sure that the level is not too high or low. . Check your blood sugar the morning of your surgery when you wake up and every 2 hours until you get to the Short Stay unit. o If your blood sugar is less than 70 mg/dL, you will need to treat for low blood sugar: - Do not take insulin. - Treat a low blood sugar (less than 70 mg/dL) with  cup of clear juice (cranberry or apple), 4 glucose tablets, OR glucose gel. - Recheck blood sugar in 15 minutes after treatment (to make sure it is greater than 70 mg/dL). If your blood sugar is not greater than 70 mg/dL on recheck, call [phone] for further instructions. . Report your blood sugar to the short stay nurse when you get to Short Stay.  . If you are admitted to the hospital after surgery: o Your blood sugar will be checked by the staff and you will probably be given insulin after surgery (instead of oral diabetes medicines) to make sure  you have good blood sugar levels. o The goal for blood sugar control after surgery is 80-180 mg/dL.                 Do not wear jewelry, make up, or nail polish            Do not wear lotions, powders, perfumes or deodorant.            Do not shave 48 hours prior to surgery.              Do not bring valuables to the hospital.            [facility] is not responsible for any belongings or valuables.  Do NOT Smoke (Tobacco/Vapping) or drink Alcohol 24 hours prior to your procedure If  you use a CPAP at night, you may bring all equipment for your overnight stay.   Contacts, glasses, dentures or bridgework may not be worn into surgery.      For patients admitted to the hospital, discharge time will be determined by your treatment team.   Patients discharged the day of surgery will not be allowed to drive home, and someone needs to stay with them for 24 hours.    Special instructions:   [place]- Preparing For Surgery  Before surgery, you can play an important role. Because skin is not sterile, your skin needs to be as free of germs as possible. You can reduce the number of germs on your skin by washing with CHG (chlorahexidine gluconate) Soap before surgery.  CHG is an antiseptic cleaner which kills germs and bonds with the skin to continue killing germs even after washing.    Oral Hygiene is also important to reduce your risk of infection.  Remember - BRUSH YOUR TEETH THE MORNING OF SURGERY WITH YOUR REGULAR TOOTHPASTE  Please do not use if you have an allergy to CHG or antibacterial soaps. If your skin becomes reddened/irritated stop using the CHG.  Do not shave (including legs and underarms) for at least 48 hours prior to first CHG shower. It is OK to shave your face.  Please follow these instructions carefully.   1. Shower the NIGHT BEFORE SURGERY and the MORNING OF SURGERY with CHG Soap.   2. If you chose to wash your hair, wash your hair first as usual with your normal shampoo.  3. After you shampoo, rinse your hair and body thoroughly to remove the shampoo.  4. Use CHG as you would any other liquid soap. You can apply CHG directly to the skin and wash gently with a scrungie or a clean washcloth.   5. Apply the CHG Soap to your body ONLY FROM THE NECK DOWN.  Do not use on open wounds or open sores. Avoid contact with your eyes, ears, mouth and genitals (private parts). Wash Face and genitals (private parts)  with your normal soap.   6. Wash thoroughly,  paying special attention to the area where your surgery will be performed.  7. Thoroughly rinse your body with warm water from the neck down.  8. DO NOT shower/wash with your normal soap after using and rinsing off the CHG Soap.  9. Pat yourself dry with a CLEAN TOWEL.  10. Wear CLEAN PAJAMAS to bed the night before surgery, wear comfortable clothes the morning of surgery  11. Place CLEAN SHEETS on your bed the night of your first shower and DO NOT SLEEP WITH PETS.   Day of Surgery:  Do not apply any deodorants/lotions.  Please wear clean clothes to the hospital/surgery center.   Remember to brush your teeth WITH YOUR REGULAR TOOTHPASTE.   Please read over the following fact sheets that you were given.

## 2019-06-29 NOTE — Progress Notes (Signed)
PCP - Carolann Littler Cardiologist - Not currently seeing one, last visit 2020 Endocrinologist - Dr. Dwyane Dee  Chest x-ray - n/a EKG - 06-29-19 Stress Test - 06-28-11 ECHO - 2013  SA - yes, does not wear CPAP  DM - Type 2 Fasting Blood Sugar - 110-120  COVID TEST- Monday 06-29-19   Anesthesia review: yes, heart history  Patient denies shortness of breath, fever, cough and chest pain at PAT appointment   All instructions explained to the patient, with a verbal understanding of the material. Patient agrees to go over the instructions while at home for a better understanding. Patient also instructed to self quarantine after being tested for COVID-19. The opportunity to ask questions was provided.

## 2019-06-30 NOTE — Progress Notes (Signed)
Anesthesia Chart Review:  Patient underwent cardiovascular evaluation in 2013 prior to craniotomy and tumor excision.  Nuclear stress was nonischemic.  Echo showed normal LVEF, normal valves.  She continued to follow with cardiology for history of palpitations.  She was last seen 12/05/2017 by Dr. Claudie Leach at [facility].  Most recent follow-up echo November 2020 showed normal LVEF, no significant valvular abnormalities.  She says she has not followed up since as she has had resolution of palpitations.  Preop labs reviewed.  Creatinine mildly elevated at 1.26 which is near her baseline.  DM2 reasonably well-controlled with A1c 7.0.  Labs otherwise unremarkable.  EKG 06/29/2019: NSR.  Rate 73.  TTE 12/03/2018 (copy on chart): Conclusions: 1.  The left atrium is mildly dilated. 2.  There is trace mitral regurgitation. 3.  Mild mitral annular calcification present. 4.  Normal cardiac chamber sizes and function; normal valve function; no pericardial effusion or intracardiac mass.  No intracardiac shunts.  Normal thoracic aorta and aortic arch.  Nuclear stress 06/28/2011: Impression: The stress electrocardiogram is normal.  Myocardial perfusion imaging is normal.  There is no evidence of ischemia or scar.  SPECT images demonstrate homogenous tracer distribution throughout the myocardium.  Gated SPECT images reveals normal myocardial thickening and wall motion.  The left ventricular ejection fraction was normal.   Dawn Salas [facility] Short Stay Center/Anesthesiology Phone [phone] 07/01/2019 2:15 PM

## 2019-07-01 NOTE — Anesthesia Preprocedure Evaluation (Addendum)
Anesthesia Evaluation  Patient identified by MRN, date of birth, ID band Patient awake    Reviewed: Allergy & Precautions, NPO status , Patient's Chart, lab work & pertinent test results, reviewed documented beta blocker date and time   Airway Mallampati: II  TM Distance: >3 FB Neck ROM: Full    Dental  (+) Dental Advisory Given, Upper Dentures, Partial Lower   Pulmonary sleep apnea , pneumonia,    breath sounds clear to auscultation       Cardiovascular hypertension, Pt. on medications and Pt. on home beta blockers  Rhythm:Regular Rate:Normal     Neuro/Psych PSYCHIATRIC DISORDERS Anxiety Depression    GI/Hepatic hiatal hernia, PUD, GERD  Medicated and Controlled,  Endo/Other  diabetes, Type 2, Oral Hypoglycemic AgentsHypothyroidism Hyperthyroidism   Renal/GU Renal disease     Musculoskeletal   Abdominal   Peds  Hematology   Anesthesia Other Findings   Reproductive/Obstetrics                          Anesthesia Physical Anesthesia Plan  ASA: III  Anesthesia Plan: General   Post-op Pain Management:    Induction: Intravenous  PONV Risk Score and Plan: Ondansetron, Dexamethasone and Midazolam  Airway Management Planned: Oral ETT  Additional Equipment:   Intra-op Plan:   Post-operative Plan: Possible Post-op intubation/ventilation  Informed Consent: I have reviewed the patients History and Physical, chart, labs and discussed the procedure including the risks, benefits and alternatives for the proposed anesthesia with the patient or authorized representative who has indicated his/her understanding and acceptance.     Dental advisory given  Plan Discussed with: CRNA and Anesthesiologist  Anesthesia Plan Comments: (PAT note by Karoline Caldwell, PA-C: Patient underwent cardiovascular evaluation in 2013 prior to craniotomy and tumor excision.  Nuclear stress was nonischemic.  Echo showed  normal LVEF, normal valves.  She continued to follow with cardiology for history of palpitations.  She was last seen 12/05/2017 by Dr. Claudie Leach at [facility].  Most recent follow-up echo November 2020 showed normal LVEF, no significant valvular abnormalities.  She says she has not followed up since as she has had resolution of palpitations.  Preop labs reviewed.  Creatinine mildly elevated at 1.26 which is near her baseline.  DM2 reasonably well-controlled with A1c 7.0.  Labs otherwise unremarkable.  EKG 06/29/2019: NSR.  Rate 73.  TTE 12/03/2018 (copy on chart): Conclusions: 1.  The left atrium is mildly dilated. 2.  There is trace mitral regurgitation. 3.  Mild mitral annular calcification present. 4.  Normal cardiac chamber sizes and function; normal valve function; no pericardial effusion or intracardiac mass.  No intracardiac shunts.  Normal thoracic aorta and aortic arch.  Nuclear stress 06/28/2011: Impression: The stress electrocardiogram is normal.  Myocardial perfusion imaging is normal.  There is no evidence of ischemia or scar.  SPECT images demonstrate homogenous tracer distribution throughout the myocardium.  Gated SPECT images reveals normal myocardial thickening and wall motion.  The left ventricular ejection fraction was normal.)       Anesthesia Quick Evaluation

## 2019-07-01 NOTE — Progress Notes (Signed)
Cardiologist - Dr. Claudie Leach Last office visit - November 2020  [place] with Nevin Bloodgood in medical records at [facility]  Requested most recent office visit and most recent echo.  Records requested to be faxed to [phone], per Jeneen Rinks.    Per patient, Dr. Claudie Leach no longer working at [facility]. Patient has not followed up with new cardiologist.  Jeneen Rinks made aware.

## 2019-07-02 ENCOUNTER — Inpatient Hospital Stay (HOSPITAL_COMMUNITY): Payer: Medicare Other

## 2019-07-02 ENCOUNTER — Encounter (HOSPITAL_COMMUNITY)
Admission: RE | Disposition: A | Discharge status: Home / Self Care | Payer: Self-pay | Source: Home / Self Care | Attending: Neurosurgery

## 2019-07-02 ENCOUNTER — Inpatient Hospital Stay (HOSPITAL_COMMUNITY): Payer: Medicare Other | Admitting: Physician Assistant

## 2019-07-02 ENCOUNTER — Inpatient Hospital Stay (HOSPITAL_COMMUNITY): Payer: Medicare Other | Admitting: Anesthesiology

## 2019-07-02 ENCOUNTER — Encounter (HOSPITAL_COMMUNITY): Payer: Self-pay | Admitting: Neurosurgery

## 2019-07-02 ENCOUNTER — Other Ambulatory Visit: Payer: Self-pay

## 2019-07-02 ENCOUNTER — Inpatient Hospital Stay (HOSPITAL_COMMUNITY)
Admission: RE | Admit: 2019-07-02 | Discharge: 2019-07-04 | DRG: 455 | Disposition: A | Discharge status: Home / Self Care | Payer: Medicare Other | Attending: Neurosurgery | Admitting: Neurosurgery

## 2019-07-02 DIAGNOSIS — I10 Essential (primary) hypertension: Secondary | ICD-10-CM | POA: Diagnosis not present

## 2019-07-02 DIAGNOSIS — Z888 Allergy status to other drugs, medicaments and biological substances status: Secondary | ICD-10-CM | POA: Diagnosis not present

## 2019-07-02 DIAGNOSIS — F419 Anxiety disorder, unspecified: Secondary | ICD-10-CM | POA: Diagnosis not present

## 2019-07-02 DIAGNOSIS — E1136 Type 2 diabetes mellitus with diabetic cataract: Secondary | ICD-10-CM | POA: Diagnosis not present

## 2019-07-02 DIAGNOSIS — Z886 Allergy status to analgesic agent status: Secondary | ICD-10-CM

## 2019-07-02 DIAGNOSIS — Z972 Presence of dental prosthetic device (complete) (partial): Secondary | ICD-10-CM

## 2019-07-02 DIAGNOSIS — Z881 Allergy status to other antibiotic agents status: Secondary | ICD-10-CM | POA: Diagnosis not present

## 2019-07-02 DIAGNOSIS — Z885 Allergy status to narcotic agent status: Secondary | ICD-10-CM

## 2019-07-02 DIAGNOSIS — Z79899 Other long term (current) drug therapy: Secondary | ICD-10-CM

## 2019-07-02 DIAGNOSIS — Z981 Arthrodesis status: Secondary | ICD-10-CM | POA: Diagnosis not present

## 2019-07-02 DIAGNOSIS — Z79890 Hormone replacement therapy (postmenopausal): Secondary | ICD-10-CM | POA: Diagnosis not present

## 2019-07-02 DIAGNOSIS — E785 Hyperlipidemia, unspecified: Secondary | ICD-10-CM | POA: Diagnosis not present

## 2019-07-02 DIAGNOSIS — G4733 Obstructive sleep apnea (adult) (pediatric): Secondary | ICD-10-CM | POA: Diagnosis present

## 2019-07-02 DIAGNOSIS — M48062 Spinal stenosis, lumbar region with neurogenic claudication: Secondary | ICD-10-CM | POA: Diagnosis not present

## 2019-07-02 DIAGNOSIS — Z419 Encounter for procedure for purposes other than remedying health state, unspecified: Secondary | ICD-10-CM

## 2019-07-02 DIAGNOSIS — M1990 Unspecified osteoarthritis, unspecified site: Secondary | ICD-10-CM | POA: Diagnosis not present

## 2019-07-02 DIAGNOSIS — K219 Gastro-esophageal reflux disease without esophagitis: Secondary | ICD-10-CM | POA: Diagnosis present

## 2019-07-02 DIAGNOSIS — Z880 Allergy status to penicillin: Secondary | ICD-10-CM | POA: Diagnosis not present

## 2019-07-02 DIAGNOSIS — M5116 Intervertebral disc disorders with radiculopathy, lumbar region: Secondary | ICD-10-CM | POA: Diagnosis present

## 2019-07-02 DIAGNOSIS — Z20822 Contact with and (suspected) exposure to covid-19: Secondary | ICD-10-CM | POA: Diagnosis present

## 2019-07-02 DIAGNOSIS — Z7984 Long term (current) use of oral hypoglycemic drugs: Secondary | ICD-10-CM | POA: Diagnosis not present

## 2019-07-02 DIAGNOSIS — F329 Major depressive disorder, single episode, unspecified: Secondary | ICD-10-CM | POA: Diagnosis not present

## 2019-07-02 LAB — GLUCOSE, CAPILLARY
Glucose-Capillary: 125 mg/dL — ABNORMAL HIGH (ref 70–99)
Glucose-Capillary: 173 mg/dL — ABNORMAL HIGH (ref 70–99)
Glucose-Capillary: 167 mg/dL — ABNORMAL HIGH (ref 70–99)
Glucose-Capillary: 201 mg/dL — ABNORMAL HIGH (ref 70–99)

## 2019-07-02 SURGERY — POSTERIOR LUMBAR FUSION 1 LEVEL
Anesthesia: General

## 2019-07-02 MED ORDER — ACETAMINOPHEN 325 MG PO TABS: 650.0000 mg | ORAL_TABLET | ORAL | Status: DC | PRN

## 2019-07-02 MED ORDER — METFORMIN HCL 500 MG PO TABS: 1000.0000 mg | ORAL_TABLET | Freq: Every day | ORAL | Status: DC

## 2019-07-02 MED ORDER — PHENYLEPHRINE 40 MCG/ML (10ML) SYRINGE FOR IV PUSH (FOR BLOOD PRESSURE SUPPORT)
PREFILLED_SYRINGE | INTRAVENOUS | Status: AC
  Filled 2019-07-02: qty 10

## 2019-07-02 MED ORDER — DEXAMETHASONE SODIUM PHOSPHATE 10 MG/ML IJ SOLN
INTRAMUSCULAR | Status: AC
  Filled 2019-07-02: qty 1

## 2019-07-02 MED ORDER — HYDROCHLOROTHIAZIDE 10 MG/ML ORAL SUSPENSION
6.2500 mg | Freq: Every day | ORAL | Status: DC
  Administered 2019-07-03: 6.25 mg via ORAL
  Filled 2019-07-02 (×3): qty 1.25

## 2019-07-02 MED ORDER — POTASSIUM CHLORIDE CRYS ER 10 MEQ PO TBCR
10.0000 meq | EXTENDED_RELEASE_TABLET | Freq: Every day | ORAL | Status: DC
  Administered 2019-07-03: 10 meq via ORAL
  Filled 2019-07-02 (×2): qty 1

## 2019-07-02 MED ORDER — CYCLOBENZAPRINE HCL 10 MG PO TABS
ORAL_TABLET | ORAL | Status: AC
  Filled 2019-07-02: qty 1

## 2019-07-02 MED ORDER — METOPROLOL SUCCINATE ER 100 MG PO TB24
100.0000 mg | ORAL_TABLET | Freq: Every day | ORAL | Status: DC
  Administered 2019-07-03: 100 mg via ORAL
  Filled 2019-07-02: qty 1

## 2019-07-02 MED ORDER — CYCLOBENZAPRINE HCL 10 MG PO TABS
10.0000 mg | ORAL_TABLET | Freq: Three times a day (TID) | ORAL | Status: DC | PRN
  Administered 2019-07-02 – 2019-07-04 (×5): 10 mg via ORAL
  Filled 2019-07-02 (×4): qty 1

## 2019-07-02 MED ORDER — SUCCINYLCHOLINE CHLORIDE 200 MG/10ML IV SOSY
PREFILLED_SYRINGE | INTRAVENOUS | Status: AC
  Filled 2019-07-02: qty 10

## 2019-07-02 MED ORDER — FENTANYL CITRATE (PF) 100 MCG/2ML IJ SOLN
INTRAMUSCULAR | Status: DC | PRN
  Administered 2019-07-02 (×3): 50 ug via INTRAVENOUS
  Administered 2019-07-02: 25 ug via INTRAVENOUS
  Administered 2019-07-02: 50 ug via INTRAVENOUS
  Administered 2019-07-02: 25 ug via INTRAVENOUS

## 2019-07-02 MED ORDER — METFORMIN HCL 500 MG PO TABS
1000.0000 mg | ORAL_TABLET | Freq: Every day | ORAL | Status: DC
  Administered 2019-07-02 – 2019-07-03 (×2): 1000 mg via ORAL
  Filled 2019-07-02 (×2): qty 2

## 2019-07-02 MED ORDER — ROCURONIUM BROMIDE 10 MG/ML (PF) SYRINGE
PREFILLED_SYRINGE | INTRAVENOUS | Status: AC
  Filled 2019-07-02: qty 10

## 2019-07-02 MED ORDER — FENTANYL CITRATE (PF) 100 MCG/2ML IJ SOLN
25.0000 ug | INTRAMUSCULAR | Status: DC | PRN
  Administered 2019-07-02: 25 ug via INTRAVENOUS

## 2019-07-02 MED ORDER — BUPIVACAINE LIPOSOME 1.3 % IJ SUSP
20.0000 mL | Freq: Once | INTRAMUSCULAR | Status: AC
  Administered 2019-07-02: 20 mL
  Filled 2019-07-02: qty 20

## 2019-07-02 MED ORDER — MIDAZOLAM HCL 5 MG/5ML IJ SOLN
INTRAMUSCULAR | Status: DC | PRN
  Administered 2019-07-02 (×2): 1 mg via INTRAVENOUS

## 2019-07-02 MED ORDER — MIDAZOLAM HCL 2 MG/2ML IJ SOLN
INTRAMUSCULAR | Status: AC
  Filled 2019-07-02: qty 2

## 2019-07-02 MED ORDER — BUPIVACAINE-EPINEPHRINE (PF) 0.5% -1:200000 IJ SOLN
INTRAMUSCULAR | Status: DC | PRN
  Administered 2019-07-02: 10 mL

## 2019-07-02 MED ORDER — FUROSEMIDE 20 MG PO TABS: 20.0000 mg | ORAL_TABLET | Freq: Every day | ORAL | Status: DC | PRN

## 2019-07-02 MED ORDER — DARIFENACIN HYDROBROMIDE ER 7.5 MG PO TB24
7.5000 mg | ORAL_TABLET | Freq: Every day | ORAL | Status: DC
  Administered 2019-07-03: 7.5 mg via ORAL
  Filled 2019-07-02 (×2): qty 1

## 2019-07-02 MED ORDER — CHLORHEXIDINE GLUCONATE CLOTH 2 % EX PADS: 6.0000 | MEDICATED_PAD | Freq: Once | CUTANEOUS | Status: DC

## 2019-07-02 MED ORDER — CHLORHEXIDINE GLUCONATE 0.12 % MT SOLN
15.0000 mL | Freq: Once | OROMUCOSAL | Status: AC
  Administered 2019-07-02: 15 mL via OROMUCOSAL
  Filled 2019-07-02: qty 15

## 2019-07-02 MED ORDER — LOSARTAN POTASSIUM 50 MG PO TABS
50.0000 mg | ORAL_TABLET | Freq: Every day | ORAL | Status: DC
  Administered 2019-07-02 – 2019-07-03 (×2): 50 mg via ORAL
  Filled 2019-07-02 (×2): qty 1

## 2019-07-02 MED ORDER — ALBUMIN HUMAN 5 % IV SOLN: INTRAVENOUS | Status: DC | PRN

## 2019-07-02 MED ORDER — VANCOMYCIN HCL IN DEXTROSE 1-5 GM/200ML-% IV SOLN
1000.0000 mg | INTRAVENOUS | Status: AC
  Administered 2019-07-02: 1000 mg via INTRAVENOUS
  Filled 2019-07-02: qty 200

## 2019-07-02 MED ORDER — HYDROMORPHONE HCL 1 MG/ML IJ SOLN
INTRAMUSCULAR | Status: AC
  Filled 2019-07-02: qty 1

## 2019-07-02 MED ORDER — THROMBIN 5000 UNITS EX SOLR
OROMUCOSAL | Status: DC | PRN
  Administered 2019-07-02: 5 mL via TOPICAL

## 2019-07-02 MED ORDER — LORATADINE 10 MG PO TABS
10.0000 mg | ORAL_TABLET | Freq: Every day | ORAL | Status: DC
  Administered 2019-07-02 – 2019-07-03 (×2): 10 mg via ORAL
  Filled 2019-07-02 (×2): qty 1

## 2019-07-02 MED ORDER — SODIUM CHLORIDE 0.9% FLUSH: 3.0000 mL | INTRAVENOUS | Status: DC | PRN

## 2019-07-02 MED ORDER — PHENYLEPHRINE 40 MCG/ML (10ML) SYRINGE FOR IV PUSH (FOR BLOOD PRESSURE SUPPORT)
PREFILLED_SYRINGE | INTRAVENOUS | Status: DC | PRN
  Administered 2019-07-02 (×5): 80 ug via INTRAVENOUS

## 2019-07-02 MED ORDER — 0.9 % SODIUM CHLORIDE (POUR BTL) OPTIME
TOPICAL | Status: DC | PRN
  Administered 2019-07-02: 1000 mL

## 2019-07-02 MED ORDER — ONDANSETRON HCL 4 MG/2ML IJ SOLN
INTRAMUSCULAR | Status: AC
  Filled 2019-07-02: qty 2

## 2019-07-02 MED ORDER — PANTOPRAZOLE SODIUM 40 MG PO TBEC
40.0000 mg | DELAYED_RELEASE_TABLET | Freq: Every day | ORAL | Status: DC
  Administered 2019-07-03: 40 mg via ORAL
  Filled 2019-07-02: qty 1

## 2019-07-02 MED ORDER — BACITRACIN ZINC 500 UNIT/GM EX OINT
TOPICAL_OINTMENT | CUTANEOUS | Status: AC
  Filled 2019-07-02: qty 28.35

## 2019-07-02 MED ORDER — ONDANSETRON HCL 4 MG/2ML IJ SOLN
INTRAMUSCULAR | Status: DC | PRN
  Administered 2019-07-02: 4 mg via INTRAVENOUS

## 2019-07-02 MED ORDER — MENTHOL 3 MG MT LOZG: 1.0000 | LOZENGE | OROMUCOSAL | Status: DC | PRN

## 2019-07-02 MED ORDER — LIDOCAINE 2% (20 MG/ML) 5 ML SYRINGE
INTRAMUSCULAR | Status: DC | PRN
  Administered 2019-07-02: 100 mg via INTRAVENOUS

## 2019-07-02 MED ORDER — BUPIVACAINE-EPINEPHRINE 0.5% -1:200000 IJ SOLN
INTRAMUSCULAR | Status: AC
  Filled 2019-07-02: qty 1

## 2019-07-02 MED ORDER — PROPOFOL 10 MG/ML IV BOLUS
INTRAVENOUS | Status: DC | PRN
  Administered 2019-07-02: 30 mg via INTRAVENOUS
  Administered 2019-07-02: 150 mg via INTRAVENOUS

## 2019-07-02 MED ORDER — BACITRACIN ZINC 500 UNIT/GM EX OINT
TOPICAL_OINTMENT | CUTANEOUS | Status: DC | PRN
  Administered 2019-07-02: 1 via TOPICAL

## 2019-07-02 MED ORDER — ACETAMINOPHEN 10 MG/ML IV SOLN
1000.0000 mg | Freq: Once | INTRAVENOUS | Status: AC
  Administered 2019-07-02: 200 mg via INTRAVENOUS
  Administered 2019-07-02: 1000 mg via INTRAVENOUS
  Filled 2019-07-02: qty 100

## 2019-07-02 MED ORDER — SODIUM CHLORIDE 0.9 % IV SOLN: 250.0000 mL | INTRAVENOUS | Status: DC

## 2019-07-02 MED ORDER — DOCUSATE SODIUM 100 MG PO CAPS
100.0000 mg | ORAL_CAPSULE | Freq: Two times a day (BID) | ORAL | Status: DC
  Administered 2019-07-02 – 2019-07-03 (×4): 100 mg via ORAL
  Filled 2019-07-02 (×4): qty 1

## 2019-07-02 MED ORDER — ONDANSETRON HCL 4 MG PO TABS: 4.0000 mg | ORAL_TABLET | Freq: Four times a day (QID) | ORAL | Status: DC | PRN

## 2019-07-02 MED ORDER — SODIUM CHLORIDE 0.9 % IV SOLN
INTRAVENOUS | Status: DC | PRN
  Administered 2019-07-02: 500 mL

## 2019-07-02 MED ORDER — INSULIN ASPART 100 UNIT/ML ~~LOC~~ SOLN
0.0000 [IU] | Freq: Three times a day (TID) | SUBCUTANEOUS | Status: DC
  Administered 2019-07-02 – 2019-07-03 (×2): 4 [IU] via SUBCUTANEOUS

## 2019-07-02 MED ORDER — BISACODYL 10 MG RE SUPP: 10.0000 mg | Freq: Every day | RECTAL | Status: DC | PRN

## 2019-07-02 MED ORDER — LEVOTHYROXINE SODIUM 75 MCG PO TABS
75.0000 ug | ORAL_TABLET | ORAL | Status: DC
  Administered 2019-07-03 – 2019-07-04 (×2): 75 ug via ORAL
  Filled 2019-07-02 (×2): qty 1

## 2019-07-02 MED ORDER — ROCURONIUM BROMIDE 10 MG/ML (PF) SYRINGE
PREFILLED_SYRINGE | INTRAVENOUS | Status: DC | PRN
  Administered 2019-07-02 (×2): 20 mg via INTRAVENOUS
  Administered 2019-07-02: 60 mg via INTRAVENOUS

## 2019-07-02 MED ORDER — FENTANYL CITRATE (PF) 250 MCG/5ML IJ SOLN
INTRAMUSCULAR | Status: AC
  Filled 2019-07-02: qty 5

## 2019-07-02 MED ORDER — SUCCINYLCHOLINE CHLORIDE 200 MG/10ML IV SOSY
PREFILLED_SYRINGE | INTRAVENOUS | Status: DC | PRN
Start: 2019-07-02 — End: 2019-07-02
  Administered 2019-07-02: 140 mg via INTRAVENOUS

## 2019-07-02 MED ORDER — LACTATED RINGERS IV SOLN: INTRAVENOUS | Status: DC

## 2019-07-02 MED ORDER — ROSUVASTATIN CALCIUM 20 MG PO TABS
40.0000 mg | ORAL_TABLET | Freq: Every day | ORAL | Status: DC
  Administered 2019-07-03: 40 mg via ORAL
  Filled 2019-07-02: qty 2

## 2019-07-02 MED ORDER — SUGAMMADEX SODIUM 200 MG/2ML IV SOLN
INTRAVENOUS | Status: DC | PRN
  Administered 2019-07-02: 200 mg via INTRAVENOUS

## 2019-07-02 MED ORDER — ACETAMINOPHEN 650 MG RE SUPP: 650.0000 mg | RECTAL | Status: DC | PRN

## 2019-07-02 MED ORDER — VANCOMYCIN HCL IN DEXTROSE 1-5 GM/200ML-% IV SOLN
1000.0000 mg | Freq: Once | INTRAVENOUS | Status: AC
  Administered 2019-07-02: 1000 mg via INTRAVENOUS
  Filled 2019-07-02: qty 200

## 2019-07-02 MED ORDER — LIDOCAINE 2% (20 MG/ML) 5 ML SYRINGE
INTRAMUSCULAR | Status: AC
  Filled 2019-07-02: qty 5

## 2019-07-02 MED ORDER — LOSARTAN POTASSIUM-HCTZ 100-12.5 MG PO TABS: 0.5000 | ORAL_TABLET | Freq: Every day | ORAL | Status: DC

## 2019-07-02 MED ORDER — FENTANYL CITRATE (PF) 100 MCG/2ML IJ SOLN
INTRAMUSCULAR | Status: AC
  Filled 2019-07-02: qty 2

## 2019-07-02 MED ORDER — ONDANSETRON HCL 4 MG/2ML IJ SOLN: 4.0000 mg | Freq: Four times a day (QID) | INTRAMUSCULAR | Status: DC | PRN

## 2019-07-02 MED ORDER — DEXAMETHASONE SODIUM PHOSPHATE 10 MG/ML IJ SOLN
INTRAMUSCULAR | Status: DC | PRN
  Administered 2019-07-02: 10 mg via INTRAVENOUS

## 2019-07-02 MED ORDER — MECLIZINE HCL 25 MG PO TABS
25.0000 mg | ORAL_TABLET | Freq: Three times a day (TID) | ORAL | Status: DC | PRN
  Filled 2019-07-02: qty 1

## 2019-07-02 MED ORDER — ACETAMINOPHEN 500 MG PO TABS
1000.0000 mg | ORAL_TABLET | Freq: Four times a day (QID) | ORAL | Status: AC
  Administered 2019-07-02 – 2019-07-03 (×3): 1000 mg via ORAL
  Filled 2019-07-02 (×4): qty 2

## 2019-07-02 MED ORDER — LEVOTHYROXINE SODIUM 75 MCG PO TABS: 37.5000 ug | ORAL_TABLET | ORAL | Status: DC

## 2019-07-02 MED ORDER — OXYCODONE HCL 5 MG PO TABS
5.0000 mg | ORAL_TABLET | ORAL | Status: DC | PRN
  Administered 2019-07-03 – 2019-07-04 (×4): 5 mg via ORAL
  Filled 2019-07-02 (×4): qty 1

## 2019-07-02 MED ORDER — THROMBIN 5000 UNITS EX SOLR
CUTANEOUS | Status: AC
  Filled 2019-07-02: qty 5000

## 2019-07-02 MED ORDER — ACETAMINOPHEN 10 MG/ML IV SOLN
INTRAVENOUS | Status: AC
  Filled 2019-07-02: qty 100

## 2019-07-02 MED ORDER — PROPOFOL 10 MG/ML IV BOLUS
INTRAVENOUS | Status: AC
  Filled 2019-07-02: qty 20

## 2019-07-02 MED ORDER — HYDROMORPHONE HCL 1 MG/ML IJ SOLN
1.0000 mg | INTRAMUSCULAR | Status: DC | PRN
  Administered 2019-07-02: 1 mg via INTRAVENOUS
  Filled 2019-07-02: qty 1

## 2019-07-02 MED ORDER — ORAL CARE MOUTH RINSE: 15.0000 mL | Freq: Once | OROMUCOSAL | Status: AC

## 2019-07-02 MED ORDER — PHENOL 1.4 % MT LIQD: 1.0000 | OROMUCOSAL | Status: DC | PRN

## 2019-07-02 MED ORDER — SODIUM CHLORIDE 0.9% FLUSH
3.0000 mL | Freq: Two times a day (BID) | INTRAVENOUS | Status: DC
  Administered 2019-07-02 – 2019-07-03 (×2): 3 mL via INTRAVENOUS

## 2019-07-02 MED ORDER — OXYCODONE HCL 5 MG PO TABS
10.0000 mg | ORAL_TABLET | ORAL | Status: DC | PRN
  Administered 2019-07-02 – 2019-07-04 (×3): 10 mg via ORAL
  Filled 2019-07-02 (×4): qty 2

## 2019-07-02 MED ORDER — ZOLPIDEM TARTRATE 5 MG PO TABS: 5.0000 mg | ORAL_TABLET | Freq: Every evening | ORAL | Status: DC | PRN

## 2019-07-02 MED ORDER — AMLODIPINE BESYLATE 5 MG PO TABS
5.0000 mg | ORAL_TABLET | Freq: Every day | ORAL | Status: DC
  Administered 2019-07-02 – 2019-07-03 (×2): 5 mg via ORAL
  Filled 2019-07-02 (×2): qty 1

## 2019-07-02 MED ORDER — PHENYLEPHRINE HCL-NACL 10-0.9 MG/250ML-% IV SOLN
INTRAVENOUS | Status: DC | PRN
  Administered 2019-07-02: 50 ug/min via INTRAVENOUS

## 2019-07-02 SURGICAL SUPPLY — 64 items
APL SKNCLS STERI-STRIP NONHPOA (GAUZE/BANDAGES/DRESSINGS) ×1
BAG DECANTER FOR FLEXI CONT (MISCELLANEOUS) ×2 IMPLANT
BENZOIN TINCTURE PRP APPL 2/3 (GAUZE/BANDAGES/DRESSINGS) ×2 IMPLANT
BLADE CLIPPER SURG (BLADE) IMPLANT
BUR MATCHSTICK NEURO 3.0 LAGG (BURR) ×2 IMPLANT
BUR PRECISION FLUTE 6.0 (BURR) ×2 IMPLANT
CANISTER SUCT 3000ML PPV (MISCELLANEOUS) ×2 IMPLANT
CARTRIDGE OIL MAESTRO DRILL (MISCELLANEOUS) ×1 IMPLANT
CNTNR URN SCR LID CUP LEK RST (MISCELLANEOUS) ×1 IMPLANT
CONT SPEC 4OZ STRL OR WHT (MISCELLANEOUS) ×2
COVER BACK TABLE 60X90IN (DRAPES) ×2 IMPLANT
COVER WAND RF STERILE (DRAPES) ×2 IMPLANT
DECANTER SPIKE VIAL GLASS SM (MISCELLANEOUS) ×2 IMPLANT
DIFFUSER DRILL AIR PNEUMATIC (MISCELLANEOUS) ×2 IMPLANT
DRAPE C-ARM 42X72 X-RAY (DRAPES) ×4 IMPLANT
DRAPE HALF SHEET 40X57 (DRAPES) ×2 IMPLANT
DRAPE LAPAROTOMY 100X72X124 (DRAPES) ×2 IMPLANT
DRAPE SURG 17X23 STRL (DRAPES) ×8 IMPLANT
DRSG OPSITE POSTOP 4X8 (GAUZE/BANDAGES/DRESSINGS) ×1 IMPLANT
ELECT BLADE 4.0 EZ CLEAN MEGAD (MISCELLANEOUS) ×2
ELECT REM PT RETURN 9FT ADLT (ELECTROSURGICAL) ×2
ELECTRODE BLDE 4.0 EZ CLN MEGD (MISCELLANEOUS) ×1 IMPLANT
ELECTRODE REM PT RTRN 9FT ADLT (ELECTROSURGICAL) ×1 IMPLANT
EVACUATOR 1/8 PVC DRAIN (DRAIN) IMPLANT
GAUZE 4X4 16PLY RFD (DISPOSABLE) ×2 IMPLANT
GAUZE SPONGE 4X4 12PLY STRL (GAUZE/BANDAGES/DRESSINGS) ×2 IMPLANT
GLOVE BIO SURGEON STRL SZ8 (GLOVE) ×4 IMPLANT
GLOVE BIO SURGEON STRL SZ8.5 (GLOVE) ×4 IMPLANT
GLOVE EXAM NITRILE XL STR (GLOVE) IMPLANT
GOWN STRL REUS W/ TWL LRG LVL3 (GOWN DISPOSABLE) IMPLANT
GOWN STRL REUS W/ TWL XL LVL3 (GOWN DISPOSABLE) ×2 IMPLANT
GOWN STRL REUS W/TWL 2XL LVL3 (GOWN DISPOSABLE) IMPLANT
GOWN STRL REUS W/TWL LRG LVL3 (GOWN DISPOSABLE)
GOWN STRL REUS W/TWL XL LVL3 (GOWN DISPOSABLE) ×4
HEMOSTAT POWDER KIT SURGIFOAM (HEMOSTASIS) ×2 IMPLANT
KIT BASIN OR (CUSTOM PROCEDURE TRAY) ×2 IMPLANT
KIT TURNOVER KIT B (KITS) ×2 IMPLANT
MILL MEDIUM DISP (BLADE) ×2 IMPLANT
NDL HYPO 21X1.5 SAFETY (NEEDLE) IMPLANT
NEEDLE HYPO 21X1.5 SAFETY (NEEDLE) IMPLANT
NEEDLE HYPO 22GX1.5 SAFETY (NEEDLE) ×2 IMPLANT
NS IRRIG 1000ML POUR BTL (IV SOLUTION) ×2 IMPLANT
OIL CARTRIDGE MAESTRO DRILL (MISCELLANEOUS) ×2
PACK LAMINECTOMY NEURO (CUSTOM PROCEDURE TRAY) ×2 IMPLANT
PAD ARMBOARD 7.5X6 YLW CONV (MISCELLANEOUS) ×6 IMPLANT
PATTIES SURGICAL .5 X1 (DISPOSABLE) IMPLANT
PUTTY DBM 10CC CALC GRAN (Putty) ×1 IMPLANT
ROD L635 90MM PREBENT (Rod) ×2 IMPLANT
SCREW DANEK NONBREAK (Screw) ×6 IMPLANT
SCREW PEDICLE VA L635 7.5X50M (Screw) ×2 IMPLANT
SCREW SET NON BREAK OFF (Screw) ×2 IMPLANT
SPACER ALTERA 10X31 8-12MM-8 (Spacer) ×1 IMPLANT
SPONGE LAP 4X18 RFD (DISPOSABLE) IMPLANT
SPONGE NEURO XRAY DETECT 1X3 (DISPOSABLE) IMPLANT
SPONGE SURGIFOAM ABS GEL 100 (HEMOSTASIS) IMPLANT
STRIP CLOSURE SKIN 1/2X4 (GAUZE/BANDAGES/DRESSINGS) ×2 IMPLANT
SUT VIC AB 1 CT1 18XBRD ANBCTR (SUTURE) ×2 IMPLANT
SUT VIC AB 1 CT1 8-18 (SUTURE) ×4
SUT VIC AB 2-0 CP2 18 (SUTURE) ×4 IMPLANT
SYR 20ML LL LF (SYRINGE) IMPLANT
TOWEL GREEN STERILE (TOWEL DISPOSABLE) ×2 IMPLANT
TOWEL GREEN STERILE FF (TOWEL DISPOSABLE) ×2 IMPLANT
TRAY FOLEY MTR SLVR 16FR STAT (SET/KITS/TRAYS/PACK) ×2 IMPLANT
WATER STERILE IRR 1000ML POUR (IV SOLUTION) ×2 IMPLANT

## 2019-07-02 NOTE — Op Note (Signed)
Brief history: The patient is a 79 year old black female on whom I have previously performed lumbar fusions.  She has developed recurrent and worsening back and leg pain.  She has failed medical management.  She was worked up with a lumbar MRI and lumbar x-rays which demonstrated severe degenerative changes and stenosis at L2-3.  I discussed the various treatment options with the patient.  She has weighed the risks, benefits and alternatives and decided proceed with an L2-3 decompression, instrumentation and fusion.  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 Etc. requiring a wide decompression of the nerve roots.);  L2-3 transforaminal lumbar interbody fusion with local morselized autograft bone and Zimmer DBM; insertion of interbody prosthesis at L2-3 (globus peek expandable interbody prosthesis); posterior segmental instrumentation from L2 to L5 with Medtronic titanium pedicle screws and rods; posterior lateral arthrodesis at L2-3 with local morselized autograft bone and Zimmer DBM; exploration of lumbar fusion/removal of lumbar hardware  Surgeon: Dr. Earle Gell  Asst.: Dr. Erline Levine and Arnetha Massy, NP  Anesthesia: Gen. endotracheal  Estimated blood loss: 250 cc  Drains: None  Complications: None  Description of procedure: The patient was brought to the operating room by the anesthesia team. General endotracheal anesthesia was induced. The patient was turned to the prone position on the Wilson frame. The patient's lumbosacral region  was then prepared with Betadine scrub and Betadine solution. Sterile drapes were applied.  I then injected the area to be incised with Marcaine with epinephrine solution. I then used the scalpel to make a linear midline incision over the L2-3, L3-4 and L4-5 interspace, incising through the old surgical scar. I then used electrocautery to perform a bilateral subperiosteal dissection exposing the spinous process and lamina of L2, L3, L4 and L5 and exposing the old hardware at L3-4 and L4-5.  We then inserted the Verstrac retractor to provide exposure.  We explored the fusion by removing the caps from the old screws and removing the old rods.  We inspected the arthrodesis at L3-4 and L4-5.  It appeared solid.    I began the decompression by using the high speed drill to perform laminotomies at L2-3 bilaterally. We then used the Kerrison punches to widen the laminotomy and removed the ligamentum flavum at L2-3 bilaterally. We used the Kerrison punches to remove the medial facets at L2-3 bilaterally, we removed the facet on the right. We performed wide foraminotomies about the bilateral L2 and L3 nerve roots completing the decompression.  We now turned our attention to the posterior lumbar interbody fusion. I used a scalpel to incise the intervertebral disc at L2-3 bilaterally. I then performed a partial intervertebral discectomy at L2-3 bilaterally using the pituitary forceps. We prepared the vertebral endplates at W9-6 bilaterally for the fusion by removing the soft tissues with the curettes. We then used the trial spacers to pick the appropriate sized interbody prosthesis. We prefilled his prosthesis with a combination of local morselized autograft bone that we obtained during the decompression as well as Zimmer DBM. We inserted the prefilled prosthesis into the interspace at L2-3 from the right, we then turned and expanded the prosthesis. There was  a good snug fit of the prosthesis in the interspace. We  then filled and the remainder of the intervertebral disc space with local morselized autograft bone and Zimmer DBM. This completed the posterior lumbar interbody arthrodesis.  During the decompression and insertion of the prosthesis the assistant protected the thecal sac and nerve roots with the D'Errico retractor.  We now turned attention to the instrumentation. Under fluoroscopic guidance we cannulated the bilateral L2 pedicles with the bone probe. We then removed the bone probe. We then tapped the pedicle with a 6.5 millimeter tap. We then removed the tap. We probed inside the tapped pedicle with a ball probe to rule out cortical breaches. We then inserted a 7.5 x 50 millimeter pedicle screw into the L2 pedicles bilaterally under fluoroscopic guidance. We then palpated along the medial aspect of the pedicles to rule out cortical breaches. There were none. The nerve roots were not injured. We then connected the unilateral pedicle screws from L2-L5 bilaterally with a lordotic rod. We compressed the construct and secured the rod in place with the caps.  We could not get a cap on the right L4 pedicle screw.  We then tightened the caps appropriately. This completed the instrumentation from L 2 to L5 bilaterally.  We now turned our attention to the posterior lateral arthrodesis at L2-3 bilaterally. We used the high-speed drill to decorticate the remainder of the facets, pars, transverse process at L2-3 bilaterally. We then applied a combination of local morselized autograft bone and Zimmer DBM over these decorticated posterior lateral structures. This completed the posterior lateral arthrodesis.  We then obtained hemostasis using bipolar electrocautery.  When when we removed all the patties 1, one by one Patty came back with a partial string.  We carefully searched the wound but could not find the string.  We obtained an intraoperative radiograph which did not demonstrate any retained foreign bodies, except for  the obvious hardware that we placed.  We assume the string went up the sucker because we could not find it.  We irrigated the wound out with bacitracin solution. We inspected the thecal sac and nerve roots and noted they were well decompressed. We then removed the retractor.  We injected Exparel . We reapproximated patient's thoracolumbar fascia with interrupted #1 Vicryl suture. We reapproximated patient's subcutaneous tissue with interrupted 2-0 Vicryl suture. The reapproximated patient's skin with Steri-Strips and benzoin. The wound was then coated with bacitracin ointment. A sterile dressing was applied. The drapes were removed. The patient was subsequently returned to the supine position where they were extubated by the anesthesia team. He was then transported to the post anesthesia care unit in stable condition. All sponge instrument and needle counts were reportedly correct at the end of this case.

## 2019-07-02 NOTE — Progress Notes (Signed)
Pharmacy Antibiotic Note  Dawn Salas is a 79 y.o. female admitted on 07/02/2019 with surgical prophylaxis.  Pharmacy has been consulted for vancomycin dosing.  Vancomycin 1 g IV administered this morning at 0705 No drains placed per Op Note  Plan: Vancomycin 1 g IV x 1 today at 1900  Height: 5\' 6"  (167.6 cm) Weight: 98.2 kg (216 lb 7 oz) IBW/kg (Calculated) : 59.3  Temp (24hrs), Avg:97.9 F (36.6 C), Min:97.5 F (36.4 C), Max:98.1 F (36.7 C)  Recent Labs  Lab 06/29/19 1150  WBC 4.1  CREATININE 1.26*    Estimated Creatinine Clearance: 44.9 mL/min (A) (by C-G formula based on SCr of 1.26 mg/dL (H)).    Allergies  Allergen Reactions  . Flagyl [Metronidazole] Other (See Comments)    Caused increased heart rate  . Aspirin Effervescent Swelling    Alka-Seltzer gel caps- sore mouth, face swollen, turned hands white  . Morphine Sulfate Hives    Hallucinations  . Penicillins Swelling and Rash    Has patient had a PCN reaction causing immediate rash, facial/tongue/throat swelling, SOB or lightheadedness with hypotension: Yes Has patient had a PCN reaction causing severe rash involving mucus membranes or skin necrosis: No Has patient had a PCN reaction that required hospitalization No Has patient had a PCN reaction occurring within the last 10 years: No If all of the above answers are "NO", then may proceed with Cephalosporin use.      Thank you for allowing pharmacy to be a part of this patient's care.  Efraim Kaufmann, PharmD, BCPS 07/02/2019 3:06 PM

## 2019-07-02 NOTE — Anesthesia Procedure Notes (Signed)
Procedure Name: Intubation Date/Time: 07/02/2019 7:38 AM Performed by: Jenne Campus, CRNA Pre-anesthesia Checklist: Patient identified, Emergency Drugs available, Suction available and Patient being monitored Patient Re-evaluated:Patient Re-evaluated prior to induction Oxygen Delivery Method: Circle System Utilized Preoxygenation: Pre-oxygenation with 100% oxygen Induction Type: IV induction Ventilation: Mask ventilation without difficulty Laryngoscope Size: Miller and 2 Grade View: Grade I Tube type: Oral Tube size: 7.5 mm Number of attempts: 1 Airway Equipment and Method: Stylet and Oral airway Placement Confirmation: ETT inserted through vocal cords under direct vision,  positive ETCO2 and breath sounds checked- equal and bilateral Secured at: 22 cm Tube secured with: Tape Dental Injury: Teeth and Oropharynx as per pre-operative assessment

## 2019-07-02 NOTE — H&P (Signed)
Subjective: The patient is a 79 year old black female on whom I previously performed lumbar fusions.  She has done well but recently has developed recurrent back and leg pain consistent with neurogenic claudication.  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  I discussed the various treatment options with her including surgery.  She has decided to proceed with surgery.  Past Medical History:  Diagnosis Date  . Allergy   . Anxiety   . Arthritis   . Asthma    patient denies   . Cataract   . Depression   . Diabetes mellitus    Type two  . Diverticulitis   . Family history of adverse reaction to anesthesia    daughter- N/V   . Frequency of urination   . GERD (gastroesophageal reflux disease)   . History of hiatal hernia   . Hyperglycemia   . Hyperlipidemia   . Hypertension   . Hyperthyroidism    PT HAD BIPOSY -NEGATIVE.Marland Kitchen AND NOW JUST GETS CHECKED EACH YEAR .Marland Kitchen 2013 LAST TEST   SEES DR. [name].   . 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 Obstructive sleep apnea    STUDY AT [facility] DOES NOT USE C PAP  . Palpitations   . Peptic ulcer disease   . Pneumonia    "walking pneumonia" hx of  . Seizures ([place]) 2015   one siezure with brain surgery none since 2015   . Sleep apnea    no CPAP    Past Surgical History:  Procedure Laterality Date  . ABDOMINAL HYSTERECTOMY    . BACK SURGERY     L SPIN 2003   NECK 2004  . BRAIN MENINGIOMA EXCISION  8/10   X 2  . C5-C6 neck fusion    . COLONOSCOPY  05/05/2009  . CRANIOTOMY  08/13/2011   Procedure: CRANIOTOMY TUMOR EXCISION;  Surgeon: [name], MD;  Location: [facility];  Service: Neurosurgery;  Laterality: Bilateral;  Bifrontal craniotomy for tumor  . DILATION AND CURETTAGE OF UTERUS     YEARS AGO  . L4-L5 posterior la    . left foot plantar and hammertoe    . right foot bunectomy    . right knee arthroscopy     x 3  . right shoulder rotator cuff     . ROBOTIC ASSISTED BILATERAL SALPINGO OOPHERECTOMY N/A 08/25/2015   Procedure: XI ROBOTIC ASSISTED BILATERAL SALPINGO OOPHORECTOMY;  Surgeon: [name], MD;  Location: [facility];  Service: Gynecology;  Laterality: N/A;  . TONSILLECTOMY    . TUBAL LIGATION    . WRIST SURGERY Right     Allergies  Allergen Reactions  . Flagyl [Metronidazole] Other (See Comments)    Caused increased heart rate  . Aspirin Effervescent Swelling    Alka-Seltzer gel caps- sore mouth, face swollen, turned hands white  . Morphine Sulfate Hives    Hallucinations  . Penicillins Swelling and Rash    Has patient had a PCN reaction causing immediate rash, facial/tongue/throat swelling, SOB or lightheadedness with hypotension: Yes Has patient had a PCN reaction causing severe rash involving mucus membranes or skin necrosis: No Has patient had a PCN reaction that required hospitalization No Has patient had a PCN reaction occurring within the last 10 years: No If all of the above answers are "NO", then may proceed with Cephalosporin use.     Social History   Tobacco Use  . Smoking status:  Never Smoker  . Smokeless tobacco: Never Used  Substance Use Topics  . Alcohol use: No    Family History  Problem Relation Age of Onset  . Cancer Mother        ovarian  . Ovarian cancer Mother   . Heart disease Father 18  . Arthritis Other   . Hyperlipidemia Other   . Hypertension Other   . Diabetes Other   . Anesthesia problems Daughter        NAUSEA AND VOMITING POST OP  . Breast cancer Neg Hx   . Colon cancer Neg Hx   . Colon polyps Neg Hx   . Esophageal cancer Neg Hx   . Stomach cancer Neg Hx   . Rectal cancer Neg Hx    Prior to Admission medications   Medication Sig Start Date End Date Taking? Authorizing Provider  acetaminophen (TYLENOL) 650 MG CR tablet Take 1,300 mg by mouth every 8 (eight) hours as needed for pain.   Yes [provider]  amLODipine (NORVASC) 5 MG tablet TAKE ONE (1) TABLET EACH  DAY Patient taking differently: Take 5 mg by mouth daily.  02/09/19  Yes Burchette, Alinda Sierras, MD  ascorbic acid (VITAMIN C) 500 MG tablet Take 500 mg by mouth daily.   Yes [provider]  Carboxymethylcellulose Sodium (REFRESH LIQUIGEL OP) Place 1 drop into both eyes at bedtime.   Yes [provider]  cetirizine (ZYRTEC) 10 MG tablet Take 10 mg by mouth daily as needed for allergies.    Yes [provider]  Cyanocobalamin (VITAMIN B12 PO) Take 1 mL by mouth daily. OTC   Yes [provider]  Iron-Vitamins (GERITOL COMPLETE PO) Take 1 tablet by mouth daily.   Yes [provider]  levothyroxine (SYNTHROID) 75 MCG tablet TAKE ONE (1) TABLET EACH DAY Patient taking differently: Take 37.5-75 mcg by mouth See admin instructions. Take 75 mcg daily on Mon through Sat, take 37.5 mcg on Sun 04/27/19  Yes Elayne Snare, MD  losartan-hydrochlorothiazide (HYZAAR) 100-12.5 MG tablet Take 1 tablet by mouth daily. Patient taking differently: Take 0.5 tablets by mouth daily.  03/31/19  Yes Burchette, Alinda Sierras, MD  Magnesium 250 MG TABS Take 250 mg by mouth daily.   Yes [provider]  meclizine (ANTIVERT) 25 MG tablet Take 25 mg by mouth 3 (three) times daily as needed for dizziness.   Yes [provider]  metFORMIN (GLUCOPHAGE) 500 MG tablet TAKE ONE TABLET TWICE A DAY WITH FOOD Patient taking differently: Take 1,000 mg by mouth daily in the afternoon.  05/15/19  Yes Burchette, Alinda Sierras, MD  metoprolol succinate (TOPROL-XL) 100 MG 24 hr tablet TAKE ONE (1) TABLET EACH DAY Patient taking differently: Take 100 mg by mouth daily.  02/23/19  Yes Burchette, Alinda Sierras, MD  OVER THE COUNTER MEDICATION Apply 1 application topically daily as needed (pain). Oxyrub otc pain relieving gel   Yes [provider]  potassium chloride (KLOR-CON) 10 MEQ tablet TAKE ONE (1) TABLET EACH DAY 06/24/19  Yes Burchette, Alinda Sierras, MD  Propylene Glycol (SYSTANE BALANCE) 0.6 % SOLN  Place 1 drop into both eyes daily.   Yes [provider]  RABEprazole (ACIPHEX) 20 MG tablet TAKE ONE (1) TABLET EACH DAY Patient taking differently: Take 20 mg by mouth daily.  02/09/19  Yes Burchette, Alinda Sierras, MD  rosuvastatin (CRESTOR) 40 MG tablet TAKE ONE (1) TABLET EACH DAY Patient taking differently: Take 40 mg by mouth daily.  01/26/19  Yes Burchette, Alinda Sierras, MD  solifenacin (VESICARE) 5 MG tablet TAKE ONE (1) TABLET EACH DAY Patient taking differently: Take 5 mg by mouth daily.  02/03/18  Yes Burchette, Alinda Sierras, MD  ACCU-CHEK AVIVA PLUS test strip TEST BLOOD SUGAR TWICE DAILY. 11/17/18   Burchette, Alinda Sierras, MD  Accu-Chek Softclix Lancets lancets TEST BLOOD SUGAR TWICE DAILY. 11/17/18   Burchette, Alinda Sierras, MD  furosemide (LASIX) 40 MG tablet Take 0.5 tablets (20 mg total) by mouth daily as needed. For fluid Patient taking differently: Take 20 mg by mouth daily as needed for edema.  09/24/14   Burchette, Alinda Sierras, MD  Lancet Devices [facility]) lancets 1 each by Other route 2 (two) times daily. E11.9 03/31/15   Burchette, Alinda Sierras, MD  valACYclovir (VALTREX) 1000 MG tablet TAKE 2 TABS TWICE DAILY FOR 1 DAY THEN AS NEEDED Patient taking differently: Take 1,000 mg by mouth See admin instructions. Take 1000 mg daily for 1 day at first sign of fever blister then take 500 mg daily until gone take as needed for fever blisters 01/02/17   Eulas Post, MD  venlafaxine (EFFEXOR XR) 37.5 MG 24 hr capsule Take 1 capsule (37.5 mg total) by mouth daily. 02/15/11 04/11/11  Burchette, Alinda Sierras, MD     Review of Systems  Positive ROS: As above  All other systems have been reviewed and were otherwise negative with the exception of those mentioned in the HPI and as above.  Objective: Vital signs in last 24 hours: Temp:  [97.5 F (36.4 C)] 97.5 F (36.4 C) (06/10 0615) Pulse Rate:  [73] 73 (06/10 0615) Resp:  [18] 18 (06/10 0615) BP: (147)/(71) 147/71 (06/10 0615) SpO2:  [100 %] 100 %  (06/10 0615) Weight:  [98.2 kg] 98.2 kg (06/10 0615) Estimated body mass index is 34.93 kg/m as calculated from the following:   Height as of this encounter: 5\' 6"  (1.676 m).   Weight as of this encounter: 98.2 kg.   General Appearance: Alert Head: Normocephalic, without obvious abnormality, atraumatic Eyes: PERRL, conjunctiva/corneas clear, EOM's intact,    Ears: Normal  Throat: Normal  Neck: Supple, Back: Her lumbar incision is well-healed. Lungs: Clear to auscultation bilaterally, respirations unlabored Heart: Regular rate and rhythm, no murmur, rub or gallop Abdomen: Soft, non-tender Extremities: Extremities normal, atraumatic, no cyanosis or edema Skin: unremarkable  NEUROLOGIC:   Mental status: alert and oriented,Motor Exam - grossly normal Sensory Exam - grossly normal Reflexes:  Coordination - grossly normal Gait - grossly normal Balance - grossly normal Cranial Nerves: I: smell Not tested  II: visual acuity  OS: Normal  OD: Normal   II: visual fields Full to confrontation  II: pupils Equal, round, reactive to light  III,VII: ptosis None  III,IV,VI: extraocular muscles  Full ROM  V: mastication Normal  V: facial light touch sensation  Normal  V,VII: corneal reflex  Present  VII: facial muscle function - upper  Normal  VII: facial muscle function - lower Normal  VIII: hearing Not tested  IX: soft palate elevation  Normal  IX,X: gag reflex Present  XI: trapezius strength  5/5  XI: sternocleidomastoid strength 5/5  XI: neck flexion strength  5/5  XII: tongue strength  Normal    Data Review Lab Results  Component Value Date   WBC 4.1 06/29/2019   HGB 13.2 06/29/2019   HCT 42.1 06/29/2019   MCV 82.2 06/29/2019   PLT 212 06/29/2019   Lab Results  Component Value Date  NA 140 06/29/2019   K 4.0 06/29/2019   CL 101 06/29/2019   CO2 27 06/29/2019   BUN 13 06/29/2019   CREATININE 1.26 (H) 06/29/2019   GLUCOSE 100 (H) 06/29/2019   Lab Results   Component Value Date   INR 1.01 08/19/2011    Assessment/Plan: 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  I have reviewed her imaging studies with her and pointed out the abnormalities.  We have discussed the various treatment options including surgery.  I have described the surgical treatment option of an exploration of her lumbar fusion with extension to L2-3.  I have shown her surgical models.  I have given her a surgical pamphlet.  We have discussed the risks, benefits, expected postoperative course, alternatives, and likelihood of achieving our goals with surgery.  I have answered all her questions.  She has decided to proceed with surgery.  [name] 07/02/2019 7:14 AM

## 2019-07-02 NOTE — Evaluation (Signed)
Physical Therapy Evaluation Patient Details Name: Dawn Salas MRN: [id number] DOB: Sep 23, 1940 Today's Date: 07/02/2019   History of Present Illness  t is a 79 y/o female with h/o previous lumbar fusions, DM, HTN, OSA admitted with recurrent and worsenting back and leg pain.  s/p bil L23 decompressive lami/foaminotomies/facetectomy, L23 TLIF/PLIF, instrumentation and grafting.  Clinical Impression  Pt admitted with/for lumbar fusion surgery.  Pt currently limited functionally due to the problems listed below.  (see problems list.)  Pt will benefit from PT to maximize function and safety to be able to get home safely with available assist.     Follow Up Recommendations No PT follow up    Equipment Recommendations  None recommended by PT (? RW)    Recommendations for Other Services       Precautions / Restrictions Precautions Precautions: Fall Required Braces or Orthoses: Spinal Brace Spinal Brace: Lumbar corset;Applied in sitting position Restrictions Weight Bearing Restrictions: No      Mobility  Bed Mobility Overal bed mobility: Needs Assistance Bed Mobility: Rolling;Sidelying to Sit;Sit to Sidelying Rolling: Min assist Sidelying to sit: Min assist     Sit to sidelying: Min assist General bed mobility comments: cues for technique, practice and truncal assist  Transfers Overall transfer level: Needs assistance   Transfers: Sit to/from Stand Sit to Stand: Min assist         General transfer comment: cues for hand placement, stability assist  Ambulation/Gait Ambulation/Gait assistance: Min guard;Min assist Gait Distance (Feet): 130 Feet Assistive device: IV Pole;None Gait Pattern/deviations: Step-through pattern;Decreased step length - right;Decreased step length - left;Decreased stride length   Gait velocity interpretation: <1.31 ft/sec, indicative of household ambulator General Gait Details: min assist or AD for stability  Stairs             Wheelchair Mobility    Modified Rankin (Stroke Patients Only)       Balance Overall balance assessment: Mild deficits observed, not formally tested                                           Pertinent Vitals/Pain Pain Assessment: Faces Faces Pain Scale: Hurts little more Pain Location: back incision Pain Descriptors / Indicators: Discomfort;Grimacing;Guarding;Sore Pain Intervention(s): Monitored during session    Home Living Family/patient expects to be discharged to:: Private residence Living Arrangements: Spouse/significant other;Children Available Help at Discharge: Family;Available 24 hours/day Type of Home: House Home Access: Stairs to enter     Home Layout: Two level;Other (Comment) (bed upstairs, baths down) Home Equipment: Walker - standard;Bedside commode      Prior Function Level of Independence: Independent;Independent with assistive device(s)               Hand Dominance        Extremity/Trunk Assessment   Upper Extremity Assessment Upper Extremity Assessment: Overall WFL for tasks assessed    Lower Extremity Assessment Lower Extremity Assessment: Overall WFL for tasks assessed    Cervical / Trunk Assessment Cervical / Trunk Assessment: Other exceptions (surgical back)  Communication   Communication: No difficulties  Cognition Arousal/Alertness: Awake/alert Behavior During Therapy: WFL for tasks assessed/performed Overall Cognitive Status: Within Functional Limits for tasks assessed  General Comments General comments (skin integrity, edema, etc.): pt instructed in back care/prec, log roll/transitions, lifting restrictions, bracing issues and progression of activity.    Exercises     Assessment/Plan    PT Assessment Patient needs continued PT services  PT Problem List Decreased strength       PT Treatment Interventions Gait training;Stair  training;Functional mobility training;Therapeutic activities;Patient/family education    PT Goals (Current goals can be found in the Care Plan section)  Acute Rehab PT Goals Patient Stated Goal: home PT Goal Formulation: With patient Time For Goal Achievement: 07/04/19 Potential to Achieve Goals: Good    Frequency Min 5X/week   Barriers to discharge        Co-evaluation               AM-PAC PT "6 Clicks" Mobility  Outcome Measure Help needed turning from your back to your side while in a flat bed without using bedrails?: A Little Help needed moving from lying on your back to sitting on the side of a flat bed without using bedrails?: A Little Help needed moving to and from a bed to a chair (including a wheelchair)?: A Little Help needed standing up from a chair using your arms (e.g., wheelchair or bedside chair)?: A Little Help needed to walk in hospital room?: A Little Help needed climbing 3-5 steps with a railing? : A Little 6 Click Score: 18    End of Session Equipment Utilized During Treatment: Back brace Activity Tolerance: Patient tolerated treatment well;Patient limited by pain Patient left: in bed;with call bell/phone within reach Nurse Communication: Mobility status PT Visit Diagnosis: Other abnormalities of gait and mobility (R26.89);Pain Pain - part of body:  (back incision)    Time: 6962-9528 PT Time Calculation (min) (ACUTE ONLY): 22 min   Charges:   PT Evaluation $PT Eval Moderate Complexity: 1 Mod          07/02/2019  Jacinto Halim., PT Acute Rehabilitation Services [phone]  (pager) [phone]  (office)  [name] 07/02/2019, 4:55 PM

## 2019-07-02 NOTE — Transfer of Care (Signed)
Immediate Anesthesia Transfer of Care Note  Patient: Dawn Salas  Procedure(s) Performed: POSTERIOR LUMBAR INTERBODY FUSION LUMBAR TWO- LUMBAR THREE; EXPLORE FUSION (N/A )  Patient Location: PACU  Anesthesia Type:General  Level of Consciousness: oriented, drowsy and patient cooperative  Airway & Oxygen Therapy: Patient Spontanous Breathing and Patient connected to face mask oxygen  Post-op Assessment: Report given to RN and Post -op Vital signs reviewed and stable  Post vital signs: Reviewed  Last Vitals:  Vitals Value Taken Time  BP 156/73 07/02/19 1203  Temp    Pulse 92 07/02/19 1205  Resp 19 07/02/19 1205  SpO2 100 % 07/02/19 1205  Vitals shown include unvalidated device data.  Last Pain:  Vitals:   07/02/19 0643  TempSrc:   PainSc: 0-No pain      Patients Stated Pain Goal: 4 (40/39/79 5369)  Complications: No complications documented.

## 2019-07-02 NOTE — Anesthesia Postprocedure Evaluation (Signed)
Anesthesia Post Note  Patient: Dawn Salas  Procedure(s) Performed: POSTERIOR LUMBAR INTERBODY FUSION LUMBAR TWO- LUMBAR THREE; EXPLORE FUSION (N/A )     Patient location during evaluation: PACU Anesthesia Type: General Level of consciousness: awake Pain management: pain level controlled Vital Signs Assessment: post-procedure vital signs reviewed and stable Respiratory status: spontaneous breathing Cardiovascular status: stable Postop Assessment: no apparent nausea or vomiting Anesthetic complications: no   No complications documented.  Last Vitals:  Vitals:   07/02/19 1348 07/02/19 1422  BP: (!) 162/83 (!) 160/80  Pulse: 87 86  Resp: 17 20  Temp: 36.7 C 36.7 C  SpO2: 99% 100%    Last Pain:  Vitals:   07/02/19 1422  TempSrc: Oral  PainSc:                  Annelle Behrendt

## 2019-07-03 LAB — BASIC METABOLIC PANEL
Anion gap: 10 (ref 5–15)
BUN: 14 mg/dL (ref 8–23)
CO2: 25 mmol/L (ref 22–32)
Calcium: 8.8 mg/dL — ABNORMAL LOW (ref 8.9–10.3)
Chloride: 102 mmol/L (ref 98–111)
Creatinine, Ser: 1.11 mg/dL — ABNORMAL HIGH (ref 0.44–1.00)
GFR calc Af Amer: 56 mL/min — ABNORMAL LOW (ref >60)
GFR calc non Af Amer: 48 mL/min — ABNORMAL LOW (ref >60)
Glucose, Bld: 129 mg/dL — ABNORMAL HIGH (ref 70–99)
Potassium: 4.2 mmol/L (ref 3.5–5.1)
Sodium: 137 mmol/L (ref 135–145)

## 2019-07-03 LAB — GLUCOSE, CAPILLARY
Glucose-Capillary: 105 mg/dL — ABNORMAL HIGH (ref 70–99)
Glucose-Capillary: 116 mg/dL — ABNORMAL HIGH (ref 70–99)
Glucose-Capillary: 171 mg/dL — ABNORMAL HIGH (ref 70–99)
Glucose-Capillary: 97 mg/dL (ref 70–99)

## 2019-07-03 LAB — CBC
HCT: 32.3 % — ABNORMAL LOW (ref 36.0–46.0)
Hemoglobin: 10.3 g/dL — ABNORMAL LOW (ref 12.0–15.0)
MCH: 25.8 pg — ABNORMAL LOW (ref 26.0–34.0)
MCHC: 31.9 g/dL (ref 30.0–36.0)
MCV: 80.8 fL (ref 80.0–100.0)
Platelets: 163 10*3/uL (ref 150–400)
RBC: 4 MIL/uL (ref 3.87–5.11)
RDW: 14.7 % (ref 11.5–15.5)
WBC: 9 10*3/uL (ref 4.0–10.5)
nRBC: 0 % (ref 0.0–0.2)

## 2019-07-03 NOTE — Evaluation (Signed)
Occupational Therapy Evaluation Patient Details Name: Dawn Salas MRN: [id number] DOB: [birth year] Today's Date: 07/03/2019    History of Present Illness Pt is a 79 y/o female with h/o previous lumbar fusions, DM, HTN, OSA admitted with recurrent and worsenting back and leg pain.  s/p bil L23 decompressive lami/foaminotomies/facetectomy, L23 TLIF/PLIF, instrumentation and grafting.   Clinical Impression   Pt is familiar with back precautions from previous multiple spine surgeries. Reinforced compensatory techniques, use of AE and IADL to avoid with pt verbalizing understanding. No further OT needs.    Follow Up Recommendations  No OT follow up    Equipment Recommendations  None recommended by OT    Recommendations for Other Services       Precautions / Restrictions Precautions Precautions: Fall;Back Precaution Comments: Pt is familiar with back precautions from previous surgeries, reinforced. Required Braces or Orthoses: Spinal Brace Spinal Brace: Lumbar corset;Applied in sitting position Restrictions Weight Bearing Restrictions: No      Mobility Bed Mobility Overal bed mobility: Needs Assistance Bed Mobility: Sit to Sidelying;Rolling Rolling: Modified independent (Device/Increase time)       Sit to sidelying: Min assist General bed mobility comments: assist for LEs back into bed  Transfers Overall transfer level: Needs assistance Equipment used: None Transfers: Sit to/from Stand Sit to Stand: Modified independent (Device/Increase time)         General transfer comment: slow to rise, but no assist    Balance                                           ADL either performed or assessed with clinical judgement   ADL Overall ADL's : Needs assistance/impaired Eating/Feeding: Independent   Grooming: Oral care;Standing;Set up;Wash/dry face Grooming Details (indicate cue type and reason): instructed in 2 cup method for toothbrushing and use of  wash cloth on face at sink Upper Body Bathing: Set up;Sitting Upper Body Bathing Details (indicate cue type and reason): instructed in use of long handled bath sponge for back Lower Body Bathing: Minimal assistance;Sit to/from stand Lower Body Bathing Details (indicate cue type and reason): instructed in use of long handled bath sponge and reacher to wash and dry feet Upper Body Dressing : Set up;Sitting   Lower Body Dressing: Minimal assistance;Sit to/from stand Lower Body Dressing Details (indicate cue type and reason): able to cross foot over opposite knee with difficulty, but also has AE Toilet Transfer: Modified Independent   Toileting- Clothing Manipulation and Hygiene: Modified independent Toileting - Clothing Manipulation Details (indicate cue type and reason): educated pt to avoid twisting with pericare     Functional mobility during ADLs: Modified independent (pt walking around room upon arrival) General ADL Comments: Educated pt in compensatory strategies for ADL and IADL to avoid. She has supportive daughters to assist.     Vision Baseline Vision/History: Wears glasses Wears Glasses: At all times Patient Visual Report: No change from baseline       Perception     Praxis      Pertinent Vitals/Pain Pain Assessment: Faces Faces Pain Scale: Hurts little more Pain Location: back incision Pain Descriptors / Indicators: Discomfort;Grimacing;Guarding;Sore Pain Intervention(s): Monitored during session;Repositioned     Hand Dominance Right   Extremity/Trunk Assessment Upper Extremity Assessment Upper Extremity Assessment: Overall WFL for tasks assessed (arthritic changes in hands)   Lower Extremity Assessment Lower Extremity Assessment: Defer to PT evaluation  Cervical / Trunk Assessment Cervical / Trunk Assessment: Other exceptions (s/p back sx)   Communication Communication Communication: No difficulties   Cognition Arousal/Alertness: Awake/alert Behavior  During Therapy: WFL for tasks assessed/performed Overall Cognitive Status: Within Functional Limits for tasks assessed                                     General Comments       Exercises     Shoulder Instructions      Home Living Family/patient expects to be discharged to:: Private residence Living Arrangements: Spouse/significant other;Children Available Help at Discharge: Family;Available 24 hours/day Type of Home: House Home Access: Stairs to enter     Home Layout: Two level;Other (Comment) Alternate Level Stairs-Number of Steps: flight Alternate Level Stairs-Rails: Right;Left Bathroom Shower/Tub: Occupational psychologist: Handicapped height     Home Equipment: Walker - standard;Bedside commode;Shower seat - built Hotel manager: Reacher;Sock aid;Long-handled sponge        Prior Functioning/Environment Level of Independence: Independent;Independent with assistive device(s)        Comments: family assisted with IADL as needed        OT Problem List:        OT Treatment/Interventions:      OT Goals(Current goals can be found in the care plan section) Acute Rehab OT Goals Patient Stated Goal: home OT Goal Formulation: With patient Time For Goal Achievement: 07/17/19 Potential to Achieve Goals: Good  OT Frequency:     Barriers to D/C:            Co-evaluation              AM-PAC OT "6 Clicks" Daily Activity     Outcome Measure Help from another person eating meals?: None Help from another person taking care of personal grooming?: None Help from another person toileting, which includes using toliet, bedpan, or urinal?: None Help from another person bathing (including washing, rinsing, drying)?: None Help from another person to put on and taking off regular upper body clothing?: None Help from another person to put on and taking off regular lower body clothing?: None 6 Click Score: 24   End of  Session    Activity Tolerance: Patient tolerated treatment well Patient left: in bed;with call bell/phone within reach  OT Visit Diagnosis: Pain;Other abnormalities of gait and mobility (R26.89)                Time: 4431-5400 OT Time Calculation (min): 31 min Charges:  OT General Charges $OT Visit: 1 Visit OT Evaluation $OT Eval Low Complexity: 1 Low OT Treatments $Self Care/Home Management : 8-22 mins  Nestor Lewandowsky, OTR/L Acute Rehabilitation Services Pager: [phone] Office: [phone] Malka So 07/03/2019, 10:40 AM

## 2019-07-03 NOTE — Progress Notes (Signed)
Physical Therapy Treatment Patient Details Name: Dawn Salas MRN: [id number] DOB: 1940/07/16 Today's Date: 07/03/2019    History of Present Illness Pt is a 79 y/o female with h/o previous lumbar fusions, DM, HTN, OSA admitted with recurrent and worsenting back and leg pain.  s/p bil L23 decompressive lami/foaminotomies/facetectomy, L23 TLIF/PLIF, instrumentation and grafting.    PT Comments    Pt progressing towards physical therapy goals. She was able to perform transfers and ambulation with up to min guard assist, but overall was functioning at a supervision level without an AD. Anticipate pt will continue to progress well at home from a mobility standpoint. We reinforced education on precautions, car transfer, brace application/wearing schedule, and appropriate activity progression.     Follow Up Recommendations  No PT follow up     Equipment Recommendations  None recommended by PT (? RW)    Recommendations for Other Services       Precautions / Restrictions Precautions Precautions: Fall;Back Precaution Comments: Pt is familiar with back precautions from previous surgeries, reinforced. Required Braces or Orthoses: Spinal Brace Spinal Brace: Lumbar corset;Applied in sitting position Restrictions Weight Bearing Restrictions: No    Mobility  Bed Mobility Overal bed mobility: Needs Assistance Bed Mobility: Sit to Sidelying;Rolling Rolling: Modified independent (Device/Increase time)       Sit to sidelying: Min assist General bed mobility comments: Pt was received sitting up EOB when PT arrived.   Transfers Overall transfer level: Needs assistance Equipment used: None Transfers: Sit to/from Stand Sit to Stand: Modified independent (Device/Increase time)         General transfer comment: Pt demonstrated proper hand placement on seated surface for safety.  Ambulation/Gait Ambulation/Gait assistance: Supervision;Min guard Gait Distance (Feet): 200 Feet Assistive  device: None Gait Pattern/deviations: Step-through pattern;Decreased step length - right;Decreased step length - left;Decreased stride length Gait velocity: Decreased Gait velocity interpretation: <1.8 ft/sec, indicate of risk for recurrent falls General Gait Details: Min guard initially progressing to supervision for safety.    Stairs             Wheelchair Mobility    Modified Rankin (Stroke Patients Only)       Balance Overall balance assessment: Mild deficits observed, not formally tested                                          Cognition Arousal/Alertness: Awake/alert Behavior During Therapy: WFL for tasks assessed/performed Overall Cognitive Status: Within Functional Limits for tasks assessed                                        Exercises      General Comments        Pertinent Vitals/Pain Pain Assessment: Faces Faces Pain Scale: Hurts little more Pain Location: back incision Pain Descriptors / Indicators: Discomfort;Grimacing;Guarding;Sore Pain Intervention(s): Limited activity within patient's tolerance;Monitored during session;Repositioned    Home Living Family/patient expects to be discharged to:: Private residence Living Arrangements: Spouse/significant other;Children Available Help at Discharge: Family;Available 24 hours/day Type of Home: House Home Access: Stairs to enter   Home Layout: Two level;Other (Comment) Home Equipment: Walker - standard;Bedside commode;Shower seat - built in;Adaptive equipment      Prior Function Level of Independence: Independent;Independent with assistive device(s)      Comments: family assisted with IADL  as needed   PT Goals (current goals can now be found in the care plan section) Acute Rehab PT Goals Patient Stated Goal: home PT Goal Formulation: With patient Time For Goal Achievement: 07/04/19 Potential to Achieve Goals: Good Progress towards PT goals: Progressing toward  goals    Frequency    Min 5X/week      PT Plan Current plan remains appropriate    Co-evaluation              AM-PAC PT "6 Clicks" Mobility   Outcome Measure  Help needed turning from your back to your side while in a flat bed without using bedrails?: A Little Help needed moving from lying on your back to sitting on the side of a flat bed without using bedrails?: A Little Help needed moving to and from a bed to a chair (including a wheelchair)?: A Little Help needed standing up from a chair using your arms (e.g., wheelchair or bedside chair)?: A Little Help needed to walk in hospital room?: A Little Help needed climbing 3-5 steps with a railing? : A Little 6 Click Score: 18    End of Session Equipment Utilized During Treatment: Back brace Activity Tolerance: Patient tolerated treatment well;Patient limited by pain Patient left: in bed;with call bell/phone within reach Nurse Communication: Mobility status PT Visit Diagnosis: Other abnormalities of gait and mobility (R26.89);Pain Pain - part of body:  (back incision)     Time: 1324-4010 PT Time Calculation (min) (ACUTE ONLY): 15 min  Charges:  $Gait Training: 8-22 mins                     Rolinda Roan, PT, DPT Acute Rehabilitation Services Pager: [phone] Office: [phone]    Thelma Comp 07/03/2019, 12:40 PM

## 2019-07-03 NOTE — Progress Notes (Signed)
Subjective: The patient is alert and pleasant.  She looks well.  She wants to go home tomorrow.  Objective: Vital signs in last 24 hours: Temp:  [97.8 F (36.6 C)-98.6 F (37 C)] 98.6 F (37 C) (06/11 0330) Pulse Rate:  [77-89] 86 (06/11 0330) Resp:  [13-21] 18 (06/11 0330) BP: (105-162)/(54-92) 105/54 (06/11 0330) SpO2:  [96 %-100 %] 100 % (06/11 0330) Estimated body mass index is 34.93 kg/m as calculated from the following:   Height as of this encounter: 5\' 6"  (1.676 m).   Weight as of this encounter: 98.2 kg.   Intake/Output from previous day: 06/10 0701 - 06/11 0700 In: 2250 [I.V.:1500; IV Piggyback:750] Out: 625 [Urine:425; Blood:200] Intake/Output this shift: No intake/output data recorded.  Physical exam the patient is alert and pleasant.  She is moving her lower extremities well.  Lab Results: No results for input(s): WBC, HGB, HCT, PLT in the last 72 hours. BMET No results for input(s): NA, K, CL, CO2, GLUCOSE, BUN, CREATININE, CALCIUM in the last 72 hours.  Studies/Results: DG Lumbar Spine 2-3 Views  Result Date: 07/02/2019 CLINICAL DATA:  Posterior lumbar fusion EXAM: LUMBAR SPINE - 2-3 VIEW; DG C-ARM 1-60 MIN COMPARISON:  04/24/2019 FINDINGS: Two fluoroscopic images are obtained during the performance of the procedure and are provided for interpretation only. Images demonstrate multilevel lumbar fusion. Since prior exam, new discectomy at L2/L3, with placement of posterior fusion hardware at the L2 level. Alignment is anatomic. FLUOROSCOPY TIME:  8 seconds IMPRESSION: 1. Intraoperative exam as above. Electronically Signed   By: Randa Ngo M.D.   On: 07/02/2019 15:18   DG C-Arm 1-60 Min  Result Date: 07/02/2019 CLINICAL DATA:  Posterior lumbar fusion EXAM: LUMBAR SPINE - 2-3 VIEW; DG C-ARM 1-60 MIN COMPARISON:  04/24/2019 FINDINGS: Two fluoroscopic images are obtained during the performance of the procedure and are provided for interpretation only. Images  demonstrate multilevel lumbar fusion. Since prior exam, new discectomy at L2/L3, with placement of posterior fusion hardware at the L2 level. Alignment is anatomic. FLUOROSCOPY TIME:  8 seconds IMPRESSION: 1. Intraoperative exam as above. Electronically Signed   By: Randa Ngo M.D.   On: 07/02/2019 15:18   DG OR LOCAL ABDOMEN  Result Date: 07/02/2019 CLINICAL DATA:  79 year old female undergoing lumbar fusion. Evaluate for retained radiopaque foreign body. EXAM: OR LOCAL ABDOMEN COMPARISON:  No priors. FINDINGS: Postoperative changes of PLIF from L2-L5 with interbody cage at L2-L3 and interbody graft at L3-L5. No unexpected radiopaque foreign body confidently identified. IMPRESSION: 1. No unexpected radiopaque foreign body identified. Electronically Signed   By: Vinnie Langton M.D.   On: 07/02/2019 11:54    Assessment/Plan: Postop day #1: The patient is doing well.  She will go home tomorrow.  I gave her her discharge instructions and answered all her questions.  LOS: 1 day     [name] 07/03/2019, 6:49 AM

## 2019-07-04 LAB — GLUCOSE, CAPILLARY: Glucose-Capillary: 116 mg/dL — ABNORMAL HIGH (ref 70–99)

## 2019-07-04 MED ORDER — MAGNESIUM CITRATE PO SOLN
1.0000 | Freq: Once | ORAL | Status: AC
  Administered 2019-07-04: 1 via ORAL
  Filled 2019-07-04: qty 296

## 2019-07-04 MED ORDER — OXYCODONE HCL 5 MG PO TABS: 5.0000 mg | ORAL_TABLET | ORAL | 0 refills | Status: DC | PRN

## 2019-07-04 MED ORDER — TIZANIDINE HCL 4 MG PO TABS
4.0000 mg | ORAL_TABLET | Freq: Four times a day (QID) | ORAL | 0 refills | Status: DC | PRN
Start: 2019-07-04 — End: 2020-02-02

## 2019-07-04 NOTE — Progress Notes (Signed)
Physical Therapy Treatment Patient Details Name: Dawn Salas MRN: [id number] DOB: 07/28/1940 Today's Date: 07/04/2019    History of Present Illness Pt is a 79 y/o female with h/o previous lumbar fusions, DM, HTN, OSA admitted with recurrent and worsenting back and leg pain.  s/p bil L23 decompressive lami/foaminotomies/facetectomy, L23 TLIF/PLIF, instrumentation and grafting.    PT Comments    Pt continuing to make progress towards achieving her current functional mobility goals. Pt able to ambulate a further distance this session. Therapist assisted pt with LB dressing in preparation for d/c home today.   Follow Up Recommendations  No PT follow up     Equipment Recommendations  None recommended by PT    Recommendations for Other Services       Precautions / Restrictions Precautions Precautions: Fall;Back Precaution Comments: Pt is familiar with back precautions from previous surgeries, reinforced. Required Braces or Orthoses: Spinal Brace Spinal Brace: Thoracolumbosacral orthotic;Applied in sitting position Restrictions Weight Bearing Restrictions: No    Mobility  Bed Mobility               General bed mobility comments: pt in bathroom upon arrival  Transfers Overall transfer level: Needs assistance Equipment used: None Transfers: Sit to/from Stand Sit to Stand: Supervision         General transfer comment: Pt demonstrated proper hand placement on seated surface for safety.  Ambulation/Gait Ambulation/Gait assistance: Supervision;Min guard Gait Distance (Feet): 500 Feet Assistive device: None Gait Pattern/deviations: Step-through pattern;Decreased step length - right;Decreased step length - left;Decreased stride length Gait velocity: Decreased   General Gait Details: Min guard initially progressing to supervision for safety.    Stairs             Wheelchair Mobility    Modified Rankin (Stroke Patients Only)       Balance Overall  balance assessment: Mild deficits observed, not formally tested                                          Cognition Arousal/Alertness: Awake/alert Behavior During Therapy: WFL for tasks assessed/performed Overall Cognitive Status: Within Functional Limits for tasks assessed                                        Exercises      General Comments        Pertinent Vitals/Pain Pain Assessment: Faces Faces Pain Scale: Hurts little more Pain Location: back incision Pain Descriptors / Indicators: Discomfort;Grimacing;Guarding;Sore Pain Intervention(s): Monitored during session;Repositioned    Home Living                      Prior Function            PT Goals (current goals can now be found in the care plan section) Acute Rehab PT Goals PT Goal Formulation: With patient Time For Goal Achievement: 07/04/19 Potential to Achieve Goals: Good Progress towards PT goals: Progressing toward goals    Frequency    Min 5X/week      PT Plan Current plan remains appropriate    Co-evaluation              AM-PAC PT "6 Clicks" Mobility   Outcome Measure  Help needed turning from your back to your side while in a  flat bed without using bedrails?: None Help needed moving from lying on your back to sitting on the side of a flat bed without using bedrails?: None Help needed moving to and from a bed to a chair (including a wheelchair)?: None Help needed standing up from a chair using your arms (e.g., wheelchair or bedside chair)?: None Help needed to walk in hospital room?: None Help needed climbing 3-5 steps with a railing? : A Little 6 Click Score: 23    End of Session Equipment Utilized During Treatment: Back brace Activity Tolerance: Patient tolerated treatment well Patient left: in chair;with call bell/phone within reach Nurse Communication: Mobility status PT Visit Diagnosis: Other abnormalities of gait and mobility  (R26.89);Pain Pain - part of body:  (back)     Time: 8309-4076 PT Time Calculation (min) (ACUTE ONLY): 21 min  Charges:  $Gait Training: 8-22 mins                     Anastasio Champion, DPT  Acute Rehabilitation Services Pager [phone] Office [place] 07/04/2019, 9:32 AM

## 2019-07-04 NOTE — Discharge Summary (Signed)
Physician Discharge Summary  Patient ID: Dawn Salas MRN: [id number] DOB/AGE: 08-28-1940 79 y.o.  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 She is ambulating, voiding, and tolerating a regular diet. Her wound is clean, dry, and without signs of infection.   Treatments: surgery: Bilateral L2-3 laminotomy/foraminotomies/medial facetectomy to decompress the bilateral L2 and L3 nerve roots(the work required to do this was in addition to the work required to do the posterior lumbar interbody fusion because of the patient's spinal stenosis, facet arthropathy. Etc. requiring a wide decompression of the nerve roots.);  L2-3 transforaminal lumbar interbody fusion with local morselized autograft bone and Zimmer DBM; insertion of interbody prosthesis at L2-3 (globus peek expandable interbody prosthesis); posterior segmental instrumentation from L2 to L5 with [employer] titanium pedicle screws and rods; posterior lateral arthrodesis at L2-3 with local morselized autograft bone and Zimmer DBM; exploration of lumbar fusion/removal of lumbar hardware     Discharge Exam: Blood pressure 133/77, pulse 90, temperature 98.9 F (37.2 C), temperature source Oral, resp. rate 18, height 5\' 6"  (1.676 m), weight 98.2 kg, SpO2 100 %. General appearance: alert, cooperative, appears stated age and mild distress  Disposition:  Discharge disposition: 01-Home or Self Care      SPINAL STENOSIS, LUMBAR REGION WITH NEUROGENIC CLAUDICATION  Allergies as of 07/04/2019      Reactions   Flagyl [metronidazole] Other (See Comments)   Caused increased heart rate   Aspirin Effervescent Swelling   Alka-Seltzer gel caps- sore mouth, face swollen, turned hands white   Morphine Sulfate Hives   Hallucinations   Penicillins Swelling, Rash   Has patient had a PCN reaction causing immediate rash, facial/tongue/throat swelling, SOB or lightheadedness with hypotension: Yes Has patient had a PCN reaction causing severe rash involving mucus membranes or skin necrosis: No Has patient had a PCN reaction that required hospitalization No Has patient had a PCN reaction occurring within the last 10 years: No If all of the above answers are "NO", then may proceed with Cephalosporin use.      Medication List    TAKE these medications   Accu-Chek Aviva Plus test strip Generic drug: glucose blood TEST BLOOD SUGAR TWICE DAILY.   accu-chek softclix lancets 1 each by Other route 2 (two) times daily. E11.9   Accu-Chek Softclix Lancets lancets TEST BLOOD SUGAR TWICE DAILY.   acetaminophen 650 MG CR tablet Commonly known as: TYLENOL Take 1,300 mg by mouth every 8 (eight) hours as needed for pain.   amLODipine 5 MG tablet Commonly known as: NORVASC TAKE ONE (1) TABLET EACH DAY What changed: See the new instructions.   ascorbic acid 500 MG tablet Commonly known as: VITAMIN C Take 500 mg by mouth daily.   cetirizine 10 MG tablet Commonly known as: ZYRTEC Take 10 mg by mouth daily as needed for allergies.   furosemide 40 MG tablet Commonly known as: LASIX Take 0.5 tablets (20 mg total) by mouth daily as needed. For fluid What changed:   reasons  to take this  additional instructions   GERITOL COMPLETE PO Take 1 tablet by mouth daily.   levothyroxine 75 MCG tablet Commonly known as: SYNTHROID TAKE ONE (1) TABLET EACH  DAY What changed: See the new instructions.   losartan-hydrochlorothiazide 100-12.5 MG tablet Commonly known as: HYZAAR Take 1 tablet by mouth daily. What changed: how much to take   Magnesium 250 MG Tabs Take 250 mg by mouth daily.   meclizine 25 MG tablet Commonly known as: ANTIVERT Take 25 mg by mouth 3 (three) times daily as needed for dizziness.   metFORMIN 500 MG tablet Commonly known as: GLUCOPHAGE TAKE ONE TABLET TWICE A DAY WITH FOOD What changed: See the new instructions.   metoprolol succinate 100 MG 24 hr tablet Commonly known as: TOPROL-XL TAKE ONE (1) TABLET EACH DAY What changed: See the new instructions.   OVER THE COUNTER MEDICATION Apply 1 application topically daily as needed (pain). Oxyrub otc pain relieving gel   oxyCODONE 5 MG immediate release tablet Commonly known as: Oxy IR/ROXICODONE Take 1 tablet (5 mg total) by mouth every 3 (three) hours as needed for moderate pain ((score 4 to 6)).   potassium chloride 10 MEQ tablet Commonly known as: KLOR-CON TAKE ONE (1) TABLET EACH DAY   RABEprazole 20 MG tablet Commonly known as: ACIPHEX TAKE ONE (1) TABLET EACH DAY What changed: See the new instructions.   REFRESH LIQUIGEL OP Place 1 drop into both eyes at bedtime.   rosuvastatin 40 MG tablet Commonly known as: CRESTOR TAKE ONE (1) TABLET EACH DAY What changed: See the new instructions.   solifenacin 5 MG tablet Commonly known as: VESICARE TAKE ONE (1) TABLET EACH DAY What changed: See the new instructions.   Systane Balance 0.6 % Soln Generic drug: Propylene Glycol Place 1 drop into both eyes daily.   tiZANidine 4 MG tablet Commonly known as: ZANAFLEX Take 1 tablet (4 mg total) by mouth every 6 (six) hours as needed for muscle spasms.   valACYclovir 1000 MG tablet Commonly known as: VALTREX TAKE 2 TABS TWICE DAILY FOR 1 DAY THEN AS NEEDED What changed: See the new instructions.   VITAMIN B12 PO Take 1 mL by mouth daily. OTC         Signed: Ashok Pall 07/04/2019, 10:48 AM

## 2019-07-04 NOTE — Discharge Instructions (Signed)

## 2019-07-04 NOTE — Progress Notes (Signed)
Patient is discharged from room 3C09 at this time. Alert and in stable condition. IV site d/c'd and instructions read to patient and daughter with understanding verbalized and all questions answered. Left unit via wheelchair with all belongings at side.  

## 2019-07-07 MED FILL — Heparin Sodium (Porcine) Inj 1000 Unit/ML: INTRAMUSCULAR | Qty: 30 | Status: AC

## 2019-07-07 MED FILL — Sodium Chloride IV Soln 0.9%: INTRAVENOUS | Qty: 1000 | Status: AC

## 2019-07-24 ENCOUNTER — Other Ambulatory Visit: Payer: Self-pay | Admitting: Neurosurgery

## 2019-07-24 ENCOUNTER — Other Ambulatory Visit: Payer: Self-pay | Admitting: Obstetrics and Gynecology

## 2019-07-24 DIAGNOSIS — E2839 Other primary ovarian failure: Secondary | ICD-10-CM

## 2019-07-27 ENCOUNTER — Other Ambulatory Visit: Payer: Self-pay | Admitting: Family Medicine

## 2019-07-29 ENCOUNTER — Encounter: Payer: Self-pay | Admitting: Endocrinology

## 2019-07-29 ENCOUNTER — Ambulatory Visit (INDEPENDENT_AMBULATORY_CARE_PROVIDER_SITE_OTHER): Payer: Medicare Other | Admitting: Endocrinology

## 2019-07-29 ENCOUNTER — Other Ambulatory Visit: Payer: Self-pay

## 2019-07-29 VITALS — BP 120/68 | HR 86 | Ht 66.0 in | Wt 218.0 lb

## 2019-07-29 DIAGNOSIS — E1169 Type 2 diabetes mellitus with other specified complication: Secondary | ICD-10-CM | POA: Diagnosis not present

## 2019-07-29 DIAGNOSIS — E669 Obesity, unspecified: Secondary | ICD-10-CM | POA: Diagnosis not present

## 2019-07-29 DIAGNOSIS — E0500 Thyrotoxicosis with diffuse goiter without thyrotoxic crisis or storm: Secondary | ICD-10-CM

## 2019-07-29 DIAGNOSIS — E890 Postprocedural hypothyroidism: Secondary | ICD-10-CM

## 2019-07-29 LAB — T4, FREE: Free T4: 1.11 ng/dL (ref 0.60–1.60)

## 2019-07-29 LAB — TSH: TSH: 1.93 u[IU]/mL (ref 0.35–4.50)

## 2019-07-29 MED ORDER — JANUMET XR 100-1000 MG PO TB24: ORAL_TABLET | ORAL | 2 refills | Status: DC

## 2019-07-29 NOTE — Progress Notes (Signed)
Patient ID: Dawn Salas, female   DOB: 28-Aug-1940, 79 y.o.   MRN: [id number]                                                                                                                  Chief complaint: Endocrinology follow-up   History of Present Illness:   History obtained on initial consultation:  She was diagnosed as having Hyperthyroidism in 2015 when she was having palpitations and was seen to have a goiter. At that time she was having the usual symptoms of fatigue, shakiness, palpitations, heat intolerance and some weight loss No details of this are available She was treated with I-131, unknown dose which was prescribed in March 2015 Apparently she had a large hot nodule on the left, however details of her treatment are not available  Apparently subsequently her hyperthyroidism recurred and methimazole restarted ?  In 4/17 Also the dose was increased up to 7.5 mg in 10/2015  Apparently prior to her being treated for the hot nodule she had a multinodular goiter evaluated with a fine-needle aspiration in 09/2010  Since she was significantly hyperthyroid on her initial exam in February and free T3 was 5.0 and was treated with methimazole Because of persistent hyperthyroidism and very high thyrotropin receptor antibody she was given I-131 treatment with 20.3 mCi on 06/04/16  RECENT history:  She has been HYPOTHYROID as of 07/2016 with a low free T4 level  She was also having some fatigue and cold sensitivity as well as the muscle cramps With starting levothyroxine 125 g she felt better, however subsequently TSH was low again the dose was reduced down to 100 g  Her last visit was in 1/21  No recent weight change Does not complain of any cold intolerance She does get tired or weak at times but this is not consistent  She is taking 75g of levothyroxine daily since 6/20 She takes this very consistently before breakfast daily and has not missed any doses lately  She  takes her Geritol with iron after breakfast which is at least 30 minutes later  Wt Readings from Last 3 Encounters:  07/29/19 218 lb (98.9 kg)  07/02/19 216 lb 7 oz (98.2 kg)  06/29/19 216 lb 7 oz (98.2 kg)     Thyroid function tests as follows:     Lab Results  Component Value Date   FREET4 1.27 01/26/2019   FREET4 1.23 09/16/2018   FREET4 1.10 07/21/2018   T3FREE 3.3 03/21/2017   T3FREE 3.1 07/06/2016   T3FREE 3.6 05/23/2016   TSH 1.06 01/26/2019   TSH 0.37 09/16/2018   TSH 0.17 (L) 07/21/2018    Lab Results  Component Value Date   THYROTRECAB 87.70 (H) 04/11/2016   Ophthalmopathy:  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 with wearing prism lenses Has some tearing of the eyes and has been given drops by the ophthalmologist  No prominence of the eyes or dryness, followed by ophthalmologist regularly Previous MRI had shown mild thickening of the rectus muscle  on the left in 2018  Her ophthalmologist wanted an opinion about Tepezza  DIABETES: See review of systems    Allergies as of 07/29/2019      Reactions   Flagyl [metronidazole] Other (See Comments)   Caused increased heart rate   Aspirin Effervescent Swelling   Alka-Seltzer gel caps- sore mouth, face swollen, turned hands white   Morphine Sulfate Hives   Hallucinations   Penicillins Swelling, Rash   Has patient had a PCN reaction causing immediate rash, facial/tongue/throat swelling, SOB or lightheadedness with hypotension: Yes Has patient had a PCN reaction causing severe rash involving mucus membranes or skin necrosis: No Has patient had a PCN reaction that required hospitalization No Has patient had a PCN reaction occurring within the last 10 years: No If all of the above answers are "NO", then may proceed with Cephalosporin use.      Medication List       Accurate as of July 29, 2019 10:11 AM. If you have any questions, ask your nurse or doctor.        Accu-Chek Aviva Plus test  strip Generic drug: glucose blood TEST BLOOD SUGAR TWICE DAILY.   accu-chek softclix lancets 1 each by Other route 2 (two) times daily. E11.9   Accu-Chek Softclix Lancets lancets TEST BLOOD SUGAR TWICE DAILY.   acetaminophen 650 MG CR tablet Commonly known as: TYLENOL Take 1,300 mg by mouth every 8 (eight) hours as needed for pain.   amLODipine 5 MG tablet Commonly known as: NORVASC TAKE ONE (1) TABLET EACH DAY What changed: See the new instructions.   ascorbic acid 500 MG tablet Commonly known as: VITAMIN C Take 500 mg by mouth daily.   cetirizine 10 MG tablet Commonly known as: ZYRTEC Take 10 mg by mouth daily as needed for allergies.   furosemide 40 MG tablet Commonly known as: LASIX Take 0.5 tablets (20 mg total) by mouth daily as needed. For fluid What changed:   reasons to take this  additional instructions   GERITOL COMPLETE PO Take 1 tablet by mouth daily.   levothyroxine 75 MCG tablet Commonly known as: SYNTHROID TAKE ONE (1) TABLET EACH DAY What changed: See the new instructions.   losartan-hydrochlorothiazide 100-12.5 MG tablet Commonly known as: HYZAAR Take 1 tablet by mouth daily. What changed: how much to take   Magnesium 250 MG Tabs Take 250 mg by mouth daily.   meclizine 25 MG tablet Commonly known as: ANTIVERT Take 25 mg by mouth 3 (three) times daily as needed for dizziness.   metFORMIN 500 MG tablet Commonly known as: GLUCOPHAGE TAKE ONE TABLET TWICE A DAY WITH FOOD What changed: See the new instructions.   metoprolol succinate 100 MG 24 hr tablet Commonly known as: TOPROL-XL TAKE ONE (1) TABLET EACH DAY What changed: See the new instructions.   OVER THE COUNTER MEDICATION Apply 1 application topically daily as needed (pain). Oxyrub otc pain relieving gel   oxyCODONE 5 MG immediate release tablet Commonly known as: Oxy IR/ROXICODONE Take 1 tablet (5 mg total) by mouth every 3 (three) hours as needed for moderate pain ((score 4  to 6)).   potassium chloride 10 MEQ tablet Commonly known as: KLOR-CON TAKE ONE (1) TABLET EACH DAY   RABEprazole 20 MG tablet Commonly known as: ACIPHEX TAKE ONE (1) TABLET EACH DAY   REFRESH LIQUIGEL OP Place 1 drop into both eyes at bedtime.   rosuvastatin 40 MG tablet Commonly known as: CRESTOR TAKE ONE (1) TABLET EACH DAY What  changed: See the new instructions.   solifenacin 5 MG tablet Commonly known as: VESICARE TAKE ONE (1) TABLET EACH DAY What changed: See the new instructions.   Systane Balance 0.6 % Soln Generic drug: Propylene Glycol Place 1 drop into both eyes daily.   tiZANidine 4 MG tablet Commonly known as: ZANAFLEX Take 1 tablet (4 mg total) by mouth every 6 (six) hours as needed for muscle spasms.   valACYclovir 1000 MG tablet Commonly known as: VALTREX TAKE 2 TABS TWICE DAILY FOR 1 DAY THEN AS NEEDED What changed: See the new instructions.   VITAMIN B12 PO Take 1 mL by mouth daily. OTC           Past Medical History:  Diagnosis Date  . Allergy   . Anxiety   . Arthritis   . Asthma    patient denies   . Cataract   . Depression   . Diabetes mellitus    Type two  . Diverticulitis   . Family history of adverse reaction to anesthesia    daughter- N/V   . Frequency of urination   . GERD (gastroesophageal reflux disease)   . History of hiatal hernia   . Hyperglycemia   . Hyperlipidemia   . Hypertension   . Hyperthyroidism    PT HAD BIPOSY -NEGATIVE.Marland Kitchen AND NOW JUST GETS CHECKED EACH YEAR .Marland Kitchen 2013 LAST TEST   SEES DR. [name].   . Multiple meningiomas of spine and brain ([place])   . Obstructive sleep apnea    STUDY AT [facility] DOES NOT USE C PAP  . Palpitations   . Peptic ulcer disease   . Pneumonia    "walking pneumonia" hx of  . Seizures ([place]) 2015   one siezure with brain surgery none since 2015   . Sleep apnea    no CPAP    Past Surgical History:  Procedure Laterality Date  . ABDOMINAL HYSTERECTOMY    . BACK SURGERY     L SPIN 2003    NECK 2004  . BRAIN MENINGIOMA EXCISION  8/10   X 2  . C5-C6 neck fusion    . COLONOSCOPY  05/05/2009  . CRANIOTOMY  08/13/2011   Procedure: CRANIOTOMY TUMOR EXCISION;  Surgeon: [name], MD;  Location: [facility];  Service: Neurosurgery;  Laterality: Bilateral;  Bifrontal craniotomy for tumor  . DILATION AND CURETTAGE OF UTERUS     YEARS AGO  . L4-L5 posterior la    . left foot plantar and hammertoe    . right foot bunectomy    . right knee arthroscopy     x 3  . right shoulder rotator cuff    . ROBOTIC ASSISTED BILATERAL SALPINGO OOPHERECTOMY N/A 08/25/2015   Procedure: XI ROBOTIC ASSISTED BILATERAL SALPINGO OOPHORECTOMY;  Surgeon: [name], MD;  Location: [facility];  Service: Gynecology;  Laterality: N/A;  . TONSILLECTOMY    . TUBAL LIGATION    . WRIST SURGERY Right     Family History  Problem Relation Age of Onset  . Cancer Mother        ovarian  . Ovarian cancer Mother   . Heart disease Father 16  . Arthritis Other   . Hyperlipidemia Other   . Hypertension Other   . Diabetes Other   . Anesthesia problems Daughter        NAUSEA AND VOMITING POST OP  . Breast cancer Neg Hx   . Colon cancer Neg Hx   . Colon polyps Neg Hx   .  Esophageal cancer Neg Hx   . Stomach cancer Neg Hx   . Rectal cancer Neg Hx     Social History:  reports that she has never smoked. She has never used smokeless tobacco. She reports that she does not drink alcohol and does not use drugs.  Allergies:  Allergies  Allergen Reactions  . Flagyl [Metronidazole] Other (See Comments)    Caused increased heart rate  . Aspirin Effervescent Swelling    Alka-Seltzer gel caps- sore mouth, face swollen, turned hands white  . Morphine Sulfate Hives    Hallucinations  . Penicillins Swelling and Rash    Has patient had a PCN reaction causing immediate rash, facial/tongue/throat swelling, SOB or lightheadedness with hypotension: Yes Has patient had a PCN reaction causing severe rash involving  mucus membranes or skin necrosis: No Has patient had a PCN reaction that required hospitalization No Has patient had a PCN reaction occurring within the last 10 years: No If all of the above answers are "NO", then may proceed with Cephalosporin use.       Review of Systems    DIABETES  Followed by PCP and treated with metformin twice a day at breakfast and dinner Notes from her last PCP visit were reviewed along with labs including A1c  A1c has been stable and is now higher at 7% done preoperatively last month  She is still taking Metformin but not taking both tablets together at about 2 PM Previously with taking her Metformin in the morning she would get shaky by lunchtime Has not had any steroids in the last few weeks She did not bring her monitor for download but periodically will have high readings after meals, highest 212 at home and 171 during her hospital stay Otherwise fasting blood sugars may be at times below 120  She is due to see her PCP in October   Lab Results  Component Value Date   HGBA1C 7.0 (H) 06/29/2019   HGBA1C 6.1 (A) 05/01/2019   HGBA1C 6.3 (A) 12/30/2018   Lab Results  Component Value Date   MICROALBUR <0.7 01/17/2018   LDLCALC 96 01/26/2019   CREATININE 1.11 (H) 07/03/2019   HYPERTENSION: Followed by PCP with good control usually   BP Readings from Last 3 Encounters:  07/29/19 120/68  07/04/19 133/77  06/29/19 (!) 155/78       Examination:   BP 120/68 (BP Location: Left Arm, Patient Position: Sitting, Cuff Size: Large)   Pulse 86   Ht 5\' 6"  (1.676 m)   Wt 218 lb (98.9 kg)   SpO2 99%   BMI 35.19 kg/m   Mild lateral deviation of the left eye No significant proptosis, no swelling of the eyes, redness or chemosis    Assessment/Plan:  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-131  Her thyroid supplement has been stable at 75 mcg levothyroxine daily Subjectively she is doing well although unlikely that her  intermittent weakness is thyroid related  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, previous orbital MRI was done in 2018 showing a mild thickening of the rectus muscle Her general ophthalmologist is inquiring about Tepezza  DIABETES with obesity: Her A1c is relatively higher She has high postprandial readings and likely will not get control with Metformin alone She has difficulty losing weight also May also benefit from consultation with nutritionist   Recommendations:  Check thyroid levels to determine the levothyroxine dose  Change Metformin to JANUMET XR 100/1000 daily which she can take at lunchtime She will follow-up  with her PCP Continue to monitor blood sugars at home but need to do more readings after meals Needs to likely see a nutritionist for consultation  She will discuss symptoms of weakness with PCP  Will refer her to Dr. Leonard Schwartz who is the oculoplastic ophthalmologist for an opinion about appropriateness of Tepezza     There are no Patient Instructions on file for this visit.  Elayne Snare 07/29/2019, 10:11 AM

## 2019-07-31 ENCOUNTER — Other Ambulatory Visit: Payer: Self-pay | Admitting: *Deleted

## 2019-07-31 ENCOUNTER — Other Ambulatory Visit: Payer: Self-pay | Admitting: Endocrinology

## 2019-07-31 NOTE — Progress Notes (Signed)
Please call to let patient know that the lab results are normal and refill the current levothyroxine

## 2019-08-15 ENCOUNTER — Other Ambulatory Visit: Payer: Self-pay | Admitting: Family Medicine

## 2019-08-22 ENCOUNTER — Other Ambulatory Visit: Payer: Self-pay | Admitting: Family Medicine

## 2019-08-26 ENCOUNTER — Ambulatory Visit
Admission: RE | Admit: 2019-08-26 | Discharge: 2019-08-26 | Disposition: A | Discharge status: Home / Self Care | Payer: Medicare Other | Source: Ambulatory Visit | Attending: Family Medicine | Admitting: Family Medicine

## 2019-08-26 ENCOUNTER — Other Ambulatory Visit: Payer: Self-pay

## 2019-08-26 DIAGNOSIS — Z1231 Encounter for screening mammogram for malignant neoplasm of breast: Secondary | ICD-10-CM

## 2019-08-31 ENCOUNTER — Other Ambulatory Visit: Payer: Self-pay | Admitting: Family Medicine

## 2019-08-31 DIAGNOSIS — R928 Other abnormal and inconclusive findings on diagnostic imaging of breast: Secondary | ICD-10-CM

## 2019-09-11 ENCOUNTER — Ambulatory Visit
Admission: RE | Admit: 2019-09-11 | Discharge: 2019-09-11 | Disposition: A | Discharge status: Home / Self Care | Payer: Medicare Other | Source: Ambulatory Visit | Attending: Family Medicine | Admitting: Family Medicine

## 2019-09-11 ENCOUNTER — Ambulatory Visit: Payer: Medicare Other

## 2019-09-11 ENCOUNTER — Other Ambulatory Visit: Payer: Self-pay

## 2019-09-11 DIAGNOSIS — R928 Other abnormal and inconclusive findings on diagnostic imaging of breast: Secondary | ICD-10-CM

## 2019-09-21 ENCOUNTER — Other Ambulatory Visit: Payer: Self-pay

## 2019-09-21 ENCOUNTER — Ambulatory Visit (INDEPENDENT_AMBULATORY_CARE_PROVIDER_SITE_OTHER): Payer: Medicare Other

## 2019-09-21 DIAGNOSIS — Z0000 Encounter for general adult medical examination without abnormal findings: Secondary | ICD-10-CM | POA: Diagnosis not present

## 2019-09-21 NOTE — Progress Notes (Signed)
Subjective:   Dawn Salas is a 79 y.o. female who presents for Medicare Annual (Subsequent) preventive examination.  I connected with Marjory Sneddon today by telephone and verified that I am speaking with the correct person using two identifiers. Location patient: home Location provider: work Persons participating in the virtual visit: patient, provider.   I discussed the limitations, risks, security and privacy concerns of performing an evaluation and management service by telephone and the availability of in person appointments. I also discussed with the patient that there may be a patient responsible charge related to this service. The patient expressed understanding and verbally consented to this telephonic visit.    Interactive audio and video telecommunications were attempted between this provider and patient, however failed, due to patient having technical difficulties OR patient did not have access to video capability.  We continued and completed visit with audio only.      Review of Systems    N/A Cardiac Risk Factors include: advanced age (>33men, >64 women);dyslipidemia;diabetes mellitus     Objective:    Today's Vitals   There is no height or weight on file to calculate BMI.  Advanced Directives 09/21/2019 07/02/2019 06/29/2019 08/25/2015 08/23/2015 04/11/2015 08/15/2014  Does Patient Have a Medical Advance Directive? No No No No No Yes No  Type of Advance Directive - - - - - Press photographer;Living will -  Does patient want to make changes to medical advance directive? - - - - - No - Patient declined -  Would patient like information on creating a medical advance directive? No - Patient declined No - Patient declined No - Patient declined No - patient declined information No - patient declined information - No - patient declined information  Pre-existing out of facility DNR order (yellow form or pink MOST form) - - - - - - -    Current Medications  (verified) Outpatient Encounter Medications as of 09/21/2019  Medication Sig  . ACCU-CHEK AVIVA PLUS test strip TEST BLOOD SUGAR TWICE DAILY.  Marland Kitchen Accu-Chek Softclix Lancets lancets TEST BLOOD SUGAR TWICE DAILY.  Marland Kitchen acetaminophen (TYLENOL) 650 MG CR tablet Take 1,300 mg by mouth every 8 (eight) hours as needed for pain.  Marland Kitchen amLODipine (NORVASC) 5 MG tablet TAKE ONE (1) TABLET EACH DAY  . ascorbic acid (VITAMIN C) 500 MG tablet Take 500 mg by mouth daily.  . Carboxymethylcellulose Sodium (REFRESH LIQUIGEL OP) Place 1 drop into both eyes at bedtime.  . cetirizine (ZYRTEC) 10 MG tablet Take 10 mg by mouth daily as needed for allergies.   . Cyanocobalamin (VITAMIN B12 PO) Take 1 mL by mouth daily. OTC  . Iron-Vitamins (GERITOL COMPLETE PO) Take 1 tablet by mouth daily.  [name] Devices (ACCU-CHEK SOFTCLIX) lancets 1 each by Other route 2 (two) times daily. E11.9  . levothyroxine (SYNTHROID) 75 MCG tablet TAKE ONE (1) TABLET EACH DAY  . losartan-hydrochlorothiazide (HYZAAR) 100-12.5 MG tablet Take 1 tablet by mouth daily. (Patient taking differently: Take 0.5 tablets by mouth daily. )  . Magnesium 250 MG TABS Take 250 mg by mouth daily.  . metoprolol succinate (TOPROL-XL) 100 MG 24 hr tablet TAKE ONE (1) TABLET EACH DAY  . OVER THE COUNTER MEDICATION Apply 1 application topically daily as needed (pain). Oxyrub otc pain relieving gel  . potassium chloride (KLOR-CON) 10 MEQ tablet TAKE ONE (1) TABLET EACH DAY  . Propylene Glycol (SYSTANE BALANCE) 0.6 % SOLN Place 1 drop into both eyes daily.  . RABEprazole (ACIPHEX) 20  MG tablet TAKE ONE (1) TABLET EACH DAY  . rosuvastatin (CRESTOR) 40 MG tablet TAKE ONE (1) TABLET EACH DAY (Patient taking differently: Take 40 mg by mouth daily. )  . SitaGLIPtin-MetFORMIN HCl (JANUMET XR) [phone] MG TB24 1 tab at lunch daily  . solifenacin (VESICARE) 5 MG tablet TAKE ONE (1) TABLET EACH DAY (Patient taking differently: Take 5 mg by mouth daily. )  . furosemide (LASIX)  40 MG tablet Take 0.5 tablets (20 mg total) by mouth daily as needed. For fluid (Patient not taking: Reported on 09/21/2019)  . meclizine (ANTIVERT) 25 MG tablet Take 25 mg by mouth 3 (three) times daily as needed for dizziness. (Patient not taking: Reported on 09/21/2019)  . metFORMIN (GLUCOPHAGE) 500 MG tablet TAKE ONE TABLET TWICE A DAY WITH FOOD (Patient not taking: Reported on 09/21/2019)  . oxyCODONE (OXY IR/ROXICODONE) 5 MG immediate release tablet Take 1 tablet (5 mg total) by mouth every 3 (three) hours as needed for moderate pain ((score 4 to 6)). (Patient not taking: Reported on 09/21/2019)  . tiZANidine (ZANAFLEX) 4 MG tablet Take 1 tablet (4 mg total) by mouth every 6 (six) hours as needed for muscle spasms. (Patient not taking: Reported on 07/29/2019)  . valACYclovir (VALTREX) 1000 MG tablet TAKE 2 TABS TWICE DAILY FOR 1 DAY THEN AS NEEDED (Patient not taking: Reported on 09/21/2019)  . [DISCONTINUED] venlafaxine (EFFEXOR XR) 37.5 MG 24 hr capsule Take 1 capsule (37.5 mg total) by mouth daily.   No facility-administered encounter medications on file as of 09/21/2019.    Allergies (verified) Flagyl [metronidazole], Aspirin effervescent, Morphine sulfate, and Penicillins   History: Past Medical History:  Diagnosis Date  . Allergy   . Anxiety   . Arthritis   . Asthma    patient denies   . Cataract   . Depression   . Diabetes mellitus    Type two  . Diverticulitis   . Family history of adverse reaction to anesthesia    daughter- N/V   . Frequency of urination   . GERD (gastroesophageal reflux disease)   . History of hiatal hernia   . Hyperglycemia   . Hyperlipidemia   . Hypertension   . Hyperthyroidism    PT HAD BIPOSY -NEGATIVE.Marland Kitchen AND NOW JUST GETS CHECKED EACH YEAR .Marland Kitchen 2013 LAST TEST   SEES DR. [name].   . 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 Obstructive sleep apnea    STUDY AT [facility] DOES NOT USE C PAP  . Palpitations   . Peptic ulcer disease   . Pneumonia     "walking pneumonia" hx of  . Seizures ([place]) 2015   one siezure with brain surgery none since 2015   . Sleep apnea    no CPAP   Past Surgical History:  Procedure Laterality Date  . ABDOMINAL HYSTERECTOMY    . BACK SURGERY     L SPIN 2003   NECK 2004  . BRAIN MENINGIOMA EXCISION  8/10   X 2  . C5-C6 neck fusion    . COLONOSCOPY  05/05/2009  . CRANIOTOMY  08/13/2011   Procedure: CRANIOTOMY TUMOR EXCISION;  Surgeon: [name], MD;  Location: [facility];  Service: Neurosurgery;  Laterality: Bilateral;  Bifrontal craniotomy for tumor  . DILATION AND CURETTAGE OF UTERUS     YEARS AGO  . L4-L5 posterior la    . left foot plantar and hammertoe    . right foot bunectomy    . right  knee arthroscopy     x 3  . right shoulder rotator cuff    . ROBOTIC ASSISTED BILATERAL SALPINGO OOPHERECTOMY N/A 08/25/2015   Procedure: XI ROBOTIC ASSISTED BILATERAL SALPINGO OOPHORECTOMY;  Surgeon: [name], MD;  Location: [facility];  Service: Gynecology;  Laterality: N/A;  . TONSILLECTOMY    . TUBAL LIGATION    . WRIST SURGERY Right    Family History  Problem Relation Age of Onset  . Cancer Mother        ovarian  . Ovarian cancer Mother   . Heart disease Father 22  . Arthritis Other   . Hyperlipidemia Other   . Hypertension Other   . Diabetes Other   . Anesthesia problems Daughter        NAUSEA AND VOMITING POST OP  . Breast cancer Neg Hx   . Colon cancer Neg Hx   . Colon polyps Neg Hx   . Esophageal cancer Neg Hx   . Stomach cancer Neg Hx   . Rectal cancer Neg Hx    Social History   Socioeconomic History  . Marital status: Married    Spouse name: Not on file  . Number of children: Not on file  . Years of education: Not on file  . Highest education level: Not on file  Occupational History  . Not on file  Tobacco Use  . Smoking status: Never Smoker  . Smokeless tobacco: Never Used  Vaping Use  . Vaping Use: Never used  Substance and Sexual Activity  . Alcohol use: No   . Drug use: No  . Sexual activity: Never  Other Topics Concern  . Not on file  Social History Narrative  . Not on file   Social Determinants of Health   Financial Resource Strain: Low Risk   . Difficulty of Paying Living Expenses: Not hard at all  Food Insecurity: No Food Insecurity  . Worried About Charity fundraiser in the Last Year: Never true  . Ran Out of Food in the Last Year: Never true  Transportation Needs: No Transportation Needs  . Lack of Transportation (Medical): No  . Lack of Transportation (Non-Medical): No  Physical Activity: Sufficiently Active  . Days of Exercise per Week: 3 days  . Minutes of Exercise per Session: 60 min  Stress: No Stress Concern Present  . Feeling of Stress : Not at all  Social Connections: Moderately Integrated  . Frequency of Communication with Friends and Family: More than three times a week  . Frequency of Social Gatherings with Friends and Family: More than three times a week  . Attends Religious Services: More than 4 times per year  . Active Member of Clubs or Organizations: No  . Attends Archivist Meetings: Never  . Marital Status: Married    Tobacco Counseling Counseling given: Not Answered   Clinical Intake:  Pre-visit preparation completed: Yes  Pain : No/denies pain     Nutritional Risks: None Diabetes: Yes (Patient states checks glucose twice a day) CBG done?: No Did pt. bring in CBG monitor from home?: No  How often do you need to have someone help you when you read instructions, pamphlets, or other written materials from your doctor or pharmacy?: 1 - Never What is the last grade level you completed in school?: GED  Diabetic?Yes  Interpreter Needed?: No  Information entered by :: [place] of Daily Living In your present state of health, do you have any difficulty performing the  following activities: 09/21/2019 07/02/2019  Hearing? N N  Vision? N N  Difficulty concentrating or  making decisions? N N  Walking or climbing stairs? N N  Dressing or bathing? N Y  Doing errands, shopping? N N  Preparing Food and eating ? N -  Using the Toilet? N -  In the past six months, have you accidently leaked urine? N -  Do you have problems with loss of bowel control? N -  Managing your Medications? N -  Managing your Finances? N -  Housekeeping or managing your Housekeeping? N -  Some recent data might be hidden    Patient Care Team: Eulas Post, MD as PCP - General  Indicate any recent Medical Services you may have received from other than [facility] providers in the past year (date may be approximate).     Assessment:   This is a routine wellness examination for Deola.  Hearing/Vision screen  Hearing Screening   125Hz  250Hz  500Hz  1000Hz  2000Hz  3000Hz  4000Hz  6000Hz  8000Hz   Right ear:           Left ear:           Vision Screening Comments: Patient states gets her eye checked annually   Dietary issues and exercise activities discussed: Current Exercise Habits: Home exercise routine, Time (Minutes): 60, Frequency (Times/Week): 3, Weekly Exercise (Minutes/Week): 180, Intensity: Mild, Exercise limited by: orthopedic condition(s)  Goals    . Patient Stated     I will continue to ride my stationary bike 3x per week for 1 hour       Depression Screen PHQ 2/9 Scores 09/21/2019 10/22/2017 10/03/2015 08/17/2014 06/30/2013 12/30/2012 09/04/2012  PHQ - 2 Score 0 2 0 0 0 0 0  PHQ- 9 Score 0 6 - - - - -    Fall Risk Fall Risk  09/21/2019 05/01/2019 10/22/2017 10/03/2015 08/17/2014  Falls in the past year? 0 0 No No No  Number falls in past yr: 0 0 - - -  Injury with Fall? 0 0 - - -  Risk for fall due to : Orthopedic patient;Medication side effect - - - -  Follow up Falls evaluation completed;Falls prevention discussed Falls evaluation completed - - -    Any stairs in or around the home? Yes  If so, are there any without handrails? No  Home free of loose throw rugs in  walkways, pet beds, electrical cords, etc? Yes  Adequate lighting in your home to reduce risk of falls? Yes   ASSISTIVE DEVICES UTILIZED TO PREVENT FALLS:  Life alert? No  Use of a cane, walker or w/c? No  Grab bars in the bathroom? Yes  Shower chair or bench in shower? Yes  Elevated toilet seat or a handicapped toilet? Yes    Cognitive Function: Cognitive screening not indicated based on direct observation        Immunizations Immunization History  Administered Date(s) Administered  . Fluad Quad(high Dose 65+) 09/30/2018  . Influenza Split 10/30/2010, 10/17/2011, 10/22/2016  . Influenza Whole 12/06/2008, 11/02/2009  . Influenza, High Dose Seasonal PF 10/03/2015, 10/22/2017  . Influenza,inj,Quad PF,6+ Mos 11/10/2012, 11/10/2013  . Moderna SARS-COVID-2 Vaccination 02/03/2019, 03/16/2019, 09/14/2019  . Pneumococcal Conjugate-13 06/30/2013  . Pneumococcal Polysaccharide-23 08/23/2008  . Td 10/26/2003  . Tdap 06/30/2013    TDAP status: Up to date Flu Vaccine status: Up to date Pneumococcal vaccine status: Up to date Covid-19 vaccine status: Completed vaccines  Qualifies for Shingles Vaccine? Yes   Zostavax completed No  Shingrix Completed?: No.    Education has been provided regarding the importance of this vaccine. Patient has been advised to call insurance company to determine out of pocket expense if they have not yet received this vaccine. Advised may also receive vaccine at local pharmacy or Health Dept. Verbalized acceptance and understanding.  Screening Tests Health Maintenance  Topic Date Due  . Hepatitis C Screening  Never done  . INFLUENZA VACCINE  08/23/2019  . HEMOGLOBIN A1C  12/29/2019  . FOOT EXAM  12/30/2019  . COLONOSCOPY  06/09/2020  . OPHTHALMOLOGY EXAM  06/10/2020  . TETANUS/TDAP  07/01/2023  . DEXA SCAN  Completed  . COVID-19 Vaccine  Completed  . PNA vac Low Risk Adult  Completed    Health Maintenance  Health Maintenance Due  Topic Date Due   . Hepatitis C Screening  Never done  . INFLUENZA VACCINE  08/23/2019    Colorectal cancer screening: Completed 06/10/2019. Repeat every 0 years Mammogram status: Completed 09/11/2019. Repeat every year Bone Density status: Ordered 10/14/2019. Pt provided with contact info and advised to call to schedule appt.  Lung Cancer Screening: (Low Dose CT Chest recommended if Age 30-80 years, 30 pack-year currently smoking OR have quit w/in 15years.) does not qualify.   Lung Cancer Screening Referral: N/A  Additional Screening:  Hepatitis C Screening: does not qualify;   Vision Screening: Recommended annual ophthalmology exams for early detection of glaucoma and other disorders of the eye. Is the patient up to date with their annual eye exam?  Yes  Who is the provider or what is the name of the office in which the patient attends annual eye exams? Dr. Kathlen Mody If pt is not established with a provider, would they like to be referred to a provider to establish care? No .   Dental Screening: Recommended annual dental exams for proper oral hygiene  Community Resource Referral / Chronic Care Management: CRR required this visit?  No   CCM required this visit?  No      Plan:     I have personally reviewed and noted the following in the patient's chart:   . Medical and social history . Use of alcohol, tobacco or illicit drugs  . Current medications and supplements . Functional ability and status . Nutritional status . Physical activity . Advanced directives . List of other physicians . Hospitalizations, surgeries, and ER visits in previous 12 months . Vitals . Screenings to include cognitive, depression, and falls . Referrals and appointments  In addition, I have reviewed and discussed with patient certain preventive protocols, quality metrics, and best practice recommendations. A written personalized care plan for preventive services as well as general preventive health recommendations  were provided to patient.     Ofilia Neas, LPN   02/25/5595   Nurse Notes: None

## 2019-09-21 NOTE — Patient Instructions (Signed)
Dawn Salas , Thank you for taking time to come for your Medicare Wellness Visit. I appreciate your ongoing commitment to your health goals. Please review the following plan we discussed and let me know if I can assist you in the future.   Screening recommendations/referrals: Colonoscopy: No longer required Mammogram: Up to date, next due 09/10/2020 Bone Density: Appointment scheduled for 10/14/2019 Recommended yearly ophthalmology/optometry visit for glaucoma screening and checkup Recommended yearly dental visit for hygiene and checkup  Vaccinations: Influenza vaccine: Up to date, next due this flu season 2021 Pneumococcal vaccine: Completed series Tdap vaccine: Up to date, next due 07/01/2023 Shingles vaccine: Currently due for Shingrix vaccine, please contact your insurance company to discuss cost     Advanced directives: Advance directive discussed with you today. Even though you declined this today please call our office should you change your mind and we can give you the proper paperwork for you to fill out.   Conditions/risks identified: none  Next appointment: 11/02/2019 @ 11:00 am with Dr. Elease Hashimoto   Preventive Care 65 Years and Older, Female Preventive care refers to lifestyle choices and visits with your health care provider that can promote health and wellness. What does preventive care include?  A yearly physical exam. This is also called an annual well check.  Dental exams once or twice a year.  Routine eye exams. Ask your health care provider how often you should have your eyes checked.  Personal lifestyle choices, including:  Daily care of your teeth and gums.  Regular physical activity.  Eating a healthy diet.  Avoiding tobacco and drug use.  Limiting alcohol use.  Practicing safe sex.  Taking low-dose aspirin every day.  Taking vitamin and mineral supplements as recommended by your health care provider. What happens during an annual well check?  The services and screenings done by your health care provider during your annual well check will depend on your age, overall health, lifestyle risk factors, and family history of disease. Counseling  Your health care provider may ask you questions about your:  Alcohol use.  Tobacco use.  Drug use.  Emotional well-being.  Home and relationship well-being.  Sexual activity.  Eating habits.  History of falls.  Memory and ability to understand (cognition).  Work and work Statistician.  Reproductive health. Screening  You may have the following tests or measurements:  Height, weight, and BMI.  Blood pressure.  Lipid and cholesterol levels. These may be checked every 5 years, or more frequently if you are over 25 years old.  Skin check.  Lung cancer screening. You may have this screening every year starting at age 77 if you have a 30-pack-year history of smoking and currently smoke or have quit within the past 15 years.  Fecal occult blood test (FOBT) of the stool. You may have this test every year starting at age 77.  Flexible sigmoidoscopy or colonoscopy. You may have a sigmoidoscopy every 5 years or a colonoscopy every 10 years starting at age 77.  Hepatitis C blood test.  Hepatitis B blood test.  Sexually transmitted disease (STD) testing.  Diabetes screening. This is done by checking your blood sugar (glucose) after you have not eaten for a while (fasting). You may have this done every 1-3 years.  Bone density scan. This is done to screen for osteoporosis. You may have this done starting at age 77.  Mammogram. This may be done every 1-2 years. Talk to your health care provider about how often you should have  regular mammograms. Talk with your health care provider about your test results, treatment options, and if necessary, the need for more tests. Vaccines  Your health care provider may recommend certain vaccines, such as:  Influenza vaccine. This is  recommended every year.  Tetanus, diphtheria, and acellular pertussis (Tdap, Td) vaccine. You may need a Td booster every 10 years.  Zoster vaccine. You may need this after age 77.  Pneumococcal 13-valent conjugate (PCV13) vaccine. One dose is recommended after age 77.  Pneumococcal polysaccharide (PPSV23) vaccine. One dose is recommended after age 77. Talk to your health care provider about which screenings and vaccines you need and how often you need them. This information is not intended to replace advice given to you by your health care provider. Make sure you discuss any questions you have with your health care provider. Document Released: 02/04/2015 Document Revised: 09/28/2015 Document Reviewed: 11/09/2014 Elsevier Interactive Patient Education  2017 [place] Prevention in the Home Falls can cause injuries. They can happen to people of all ages. There are many things you can do to make your home safe and to help prevent falls. What can I do on the outside of my home?  Regularly fix the edges of walkways and driveways and fix any cracks.  Remove anything that might make you trip as you walk through a door, such as a raised step or threshold.  Trim any bushes or trees on the path to your home.  Use bright outdoor lighting.  Clear any walking paths of anything that might make someone trip, such as rocks or tools.  Regularly check to see if handrails are loose or broken. Make sure that both sides of any steps have handrails.  Any raised decks and porches should have guardrails on the edges.  Have any leaves, snow, or ice cleared regularly.  Use sand or salt on walking paths during winter.  Clean up any spills in your garage right away. This includes oil or grease spills. What can I do in the bathroom?  Use night lights.  Install grab bars by the toilet and in the tub and shower. Do not use towel bars as grab bars.  Use non-skid mats or decals in the tub or  shower.  If you need to sit down in the shower, use a plastic, non-slip stool.  Keep the floor dry. Clean up any water that spills on the floor as soon as it happens.  Remove soap buildup in the tub or shower regularly.  Attach bath mats securely with double-sided non-slip rug tape.  Do not have throw rugs and other things on the floor that can make you trip. What can I do in the bedroom?  Use night lights.  Make sure that you have a light by your bed that is easy to reach.  Do not use any sheets or blankets that are too big for your bed. They should not hang down onto the floor.  Have a firm chair that has side arms. You can use this for support while you get dressed.  Do not have throw rugs and other things on the floor that can make you trip. What can I do in the kitchen?  Clean up any spills right away.  Avoid walking on wet floors.  Keep items that you use a lot in easy-to-reach places.  If you need to reach something above you, use a strong step stool that has a grab bar.  Keep electrical cords out of the  way.  Do not use floor polish or wax that makes floors slippery. If you must use wax, use non-skid floor wax.  Do not have throw rugs and other things on the floor that can make you trip. What can I do with my stairs?  Do not leave any items on the stairs.  Make sure that there are handrails on both sides of the stairs and use them. Fix handrails that are broken or loose. Make sure that handrails are as long as the stairways.  Check any carpeting to make sure that it is firmly attached to the stairs. Fix any carpet that is loose or worn.  Avoid having throw rugs at the top or bottom of the stairs. If you do have throw rugs, attach them to the floor with carpet tape.  Make sure that you have a light switch at the top of the stairs and the bottom of the stairs. If you do not have them, ask someone to add them for you. What else can I do to help prevent falls?   Wear shoes that:  Do not have high heels.  Have rubber bottoms.  Are comfortable and fit you well.  Are closed at the toe. Do not wear sandals.  If you use a stepladder:  Make sure that it is fully opened. Do not climb a closed stepladder.  Make sure that both sides of the stepladder are locked into place.  Ask someone to hold it for you, if possible.  Clearly mark and make sure that you can see:  Any grab bars or handrails.  First and last steps.  Where the edge of each step is.  Use tools that help you move around (mobility aids) if they are needed. These include:  Canes.  Walkers.  Scooters.  Crutches.  Turn on the lights when you go into a dark area. Replace any light bulbs as soon as they burn out.  Set up your furniture so you have a clear path. Avoid moving your furniture around.  If any of your floors are uneven, fix them.  If there are any pets around you, be aware of where they are.  Review your medicines with your doctor. Some medicines can make you feel dizzy. This can increase your chance of falling. Ask your doctor what other things that you can do to help prevent falls. This information is not intended to replace advice given to you by your health care provider. Make sure you discuss any questions you have with your health care provider. Document Released: 11/04/2008 Document Revised: 06/16/2015 Document Reviewed: 02/12/2014 Elsevier Interactive Patient Education  2017 [employer].

## 2019-09-23 ENCOUNTER — Other Ambulatory Visit: Payer: Self-pay | Admitting: Endocrinology

## 2019-09-28 ENCOUNTER — Other Ambulatory Visit: Payer: Self-pay | Admitting: Family Medicine

## 2019-10-01 ENCOUNTER — Other Ambulatory Visit: Payer: Self-pay | Admitting: Dermatology

## 2019-10-14 ENCOUNTER — Other Ambulatory Visit: Payer: Self-pay

## 2019-10-14 ENCOUNTER — Ambulatory Visit
Admission: RE | Admit: 2019-10-14 | Discharge: 2019-10-14 | Disposition: A | Discharge status: Home / Self Care | Payer: Medicare Other | Source: Ambulatory Visit | Attending: Obstetrics and Gynecology | Admitting: Obstetrics and Gynecology

## 2019-10-14 DIAGNOSIS — E2839 Other primary ovarian failure: Secondary | ICD-10-CM

## 2019-10-24 ENCOUNTER — Other Ambulatory Visit: Payer: Self-pay | Admitting: Family Medicine

## 2019-11-02 ENCOUNTER — Ambulatory Visit (INDEPENDENT_AMBULATORY_CARE_PROVIDER_SITE_OTHER): Payer: Medicare Other | Admitting: Family Medicine

## 2019-11-02 ENCOUNTER — Other Ambulatory Visit: Payer: Self-pay

## 2019-11-02 VITALS — BP 112/84 | HR 69 | Temp 97.8°F | Ht 66.0 in | Wt 220.3 lb

## 2019-11-02 DIAGNOSIS — E7800 Pure hypercholesterolemia, unspecified: Secondary | ICD-10-CM | POA: Diagnosis not present

## 2019-11-02 DIAGNOSIS — N181 Chronic kidney disease, stage 1: Secondary | ICD-10-CM | POA: Diagnosis not present

## 2019-11-02 DIAGNOSIS — M79675 Pain in left toe(s): Secondary | ICD-10-CM

## 2019-11-02 DIAGNOSIS — Z23 Encounter for immunization: Secondary | ICD-10-CM

## 2019-11-02 DIAGNOSIS — I10 Essential (primary) hypertension: Secondary | ICD-10-CM | POA: Diagnosis not present

## 2019-11-02 DIAGNOSIS — E1122 Type 2 diabetes mellitus with diabetic chronic kidney disease: Secondary | ICD-10-CM

## 2019-11-02 LAB — POCT GLYCOSYLATED HEMOGLOBIN (HGB A1C): Hemoglobin A1C: 6.6 % — AB (ref 4.0–5.6)

## 2019-11-02 NOTE — Progress Notes (Signed)
Established Patient Office Visit  Subjective:  Patient ID: Dawn Salas, female    DOB: [birth year]  Age: 79 y.o. MRN: [id number]  CC:  Chief Complaint  Patient presents with  . Diabetes    dm and tsh follow up  . Pain    having pain in the left foot 3rd toe, thinks it may be to artritis or wearing tight shoe    HPI Dawn Salas presents for the following issues.  She relates she is having some left third toe pain.  She went to a funeral on Saturday and wore pair shoes that she thinks may have been tight on her feet may have aggravated things.  By Sunday she had some bruising and pain and mild swelling of the third toe.  She is concerned because of her diabetes status.  She has had previous surgery for "claw" toe.  She is ambulating fairly well but has some pain with walking  She has type 2 diabetes.  A1c most recent was 7.0%.  Endocrinologist she has seen who is manage her hypothyroidism had changed her from Metformin to [place].  Not monitoring sugars regularly.  Requesting A1c today  She has hyperlipidemia history.  She is on rosuvastatin.  Her last lipids were checked last winter and stable.  She has history of high HDL cholesterol.  She has hypertension currently treated with amlodipine and losartan HCTZ.  Also takes Toprol-XL 100 mg daily no recent dizziness.  Past Medical History:  Diagnosis Date  . Allergy   . Anxiety   . Arthritis   . Asthma    patient denies   . Cataract   . Depression   . Diabetes mellitus    Type two  . Diverticulitis   . Family history of adverse reaction to anesthesia    daughter- N/V   . Frequency of urination   . GERD (gastroesophageal reflux disease)   . History of hiatal hernia   . Hyperglycemia   . Hyperlipidemia   . Hypertension   . Hyperthyroidism    PT HAD BIPOSY -NEGATIVE.Marland Kitchen AND NOW JUST GETS CHECKED EACH YEAR .Marland Kitchen 2013 LAST TEST   SEES DR. [name].   . 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 Obstructive sleep apnea     STUDY [facility] DOES NOT USE C PAP  . Palpitations   . Peptic ulcer disease   . Pneumonia    "walking pneumonia" hx of  . Seizures ([place]) 2015   one siezure with brain surgery none since 2015   . Sleep apnea    no CPAP    Past Surgical History:  Procedure Laterality Date  . ABDOMINAL HYSTERECTOMY    . BACK SURGERY     L SPIN 2003   NECK 2004  . BRAIN MENINGIOMA EXCISION  8/10   X 2  . C5-C6 neck fusion    . COLONOSCOPY  05/05/2009  . CRANIOTOMY  08/13/2011   Procedure: CRANIOTOMY TUMOR EXCISION;  Surgeon: [name], MD;  Location: [facility];  Service: Neurosurgery;  Laterality: Bilateral;  Bifrontal craniotomy for tumor  . DILATION AND CURETTAGE OF UTERUS     YEARS AGO  . L4-L5 posterior la    . left foot plantar and hammertoe    . right foot bunectomy    . right knee arthroscopy     x 3  . right shoulder rotator cuff    . ROBOTIC ASSISTED BILATERAL SALPINGO OOPHERECTOMY N/A 08/25/2015  Procedure: XI ROBOTIC ASSISTED BILATERAL SALPINGO OOPHORECTOMY;  Surgeon: [name], MD;  Location: [facility];  Service: Gynecology;  Laterality: N/A;  . TONSILLECTOMY    . TUBAL LIGATION    . WRIST SURGERY Right     Family History  Problem Relation Age of Onset  . Cancer Mother        ovarian  . Ovarian cancer Mother   . Heart disease Father 76  . Arthritis Other   . Hyperlipidemia Other   . Hypertension Other   . Diabetes Other   . Anesthesia problems Daughter        NAUSEA AND VOMITING POST OP  . Breast cancer Neg Hx   . Colon cancer Neg Hx   . Colon polyps Neg Hx   . Esophageal cancer Neg Hx   . Stomach cancer Neg Hx   . Rectal cancer Neg Hx     Social History   Socioeconomic History  . Marital status: Married    Spouse name: Not on file  . Number of children: Not on file  . Years of education: Not on file  . Highest education level: Not on file  Occupational History  . Not on file  Tobacco Use  . Smoking status: Never Smoker  . Smokeless tobacco:  Never Used  Vaping Use  . Vaping Use: Never used  Substance and Sexual Activity  . Alcohol use: No  . Drug use: No  . Sexual activity: Never  Other Topics Concern  . Not on file  Social History Narrative  . Not on file   Social Determinants of Health   Financial Resource Strain: Low Risk   . Difficulty of Paying Living Expenses: Not hard at all  Food Insecurity: No Food Insecurity  . Worried About Charity fundraiser in the Last Year: Never true  . Ran Out of Food in the Last Year: Never true  Transportation Needs: No Transportation Needs  . Lack of Transportation (Medical): No  . Lack of Transportation (Non-Medical): No  Physical Activity: Sufficiently Active  . Days of Exercise per Week: 3 days  . Minutes of Exercise per Session: 60 min  Stress: No Stress Concern Present  . Feeling of Stress : Not at all  Social Connections: Moderately Integrated  . Frequency of Communication with Friends and Family: More than three times a week  . Frequency of Social Gatherings with Friends and Family: More than three times a week  . Attends Religious Services: More than 4 times per year  . Active Member of Clubs or Organizations: No  . Attends Archivist Meetings: Never  . Marital Status: Married  Human resources officer Violence: Not At Risk  . Fear of Current or Ex-Partner: No  . Emotionally Abused: No  . Physically Abused: No  . Sexually Abused: No    Outpatient Medications Prior to Visit  Medication Sig Dispense Refill  . ACCU-CHEK AVIVA PLUS test strip TEST BLOOD SUGAR TWICE DAILY. 50 strip 0  . Accu-Chek Softclix Lancets lancets TEST BLOOD SUGAR TWICE DAILY. 100 each 3  . acetaminophen (TYLENOL) 650 MG CR tablet Take 1,300 mg by mouth every 8 (eight) hours as needed for pain.    Marland Kitchen amLODipine (NORVASC) 5 MG tablet TAKE ONE (1) TABLET EACH DAY 90 tablet 1  . ascorbic acid (VITAMIN C) 500 MG tablet Take 500 mg by mouth daily.    . betamethasone dipropionate 0.05 % cream APPLY  TO LEGS AS DIRECTED 45 g 0  .  Carboxymethylcellulose Sodium (REFRESH LIQUIGEL OP) Place 1 drop into both eyes at bedtime.    . cetirizine (ZYRTEC) 10 MG tablet Take 10 mg by mouth daily as needed for allergies.     . Cyanocobalamin (VITAMIN B12 PO) Take 1 mL by mouth daily. OTC    . furosemide (LASIX) 40 MG tablet Take 0.5 tablets (20 mg total) by mouth daily as needed. For fluid 30 tablet 3  . Iron-Vitamins (GERITOL COMPLETE PO) Take 1 tablet by mouth daily.    [name] Devices (ACCU-CHEK SOFTCLIX) lancets 1 each by Other route 2 (two) times daily. E11.9 100 each 3  . levothyroxine (SYNTHROID) 75 MCG tablet TAKE ONE (1) TABLET EACH DAY 90 tablet 0  . losartan-hydrochlorothiazide (HYZAAR) 100-12.5 MG tablet Take 1 tablet by mouth daily. (Patient taking differently: Take 0.5 tablets by mouth daily. ) 90 tablet 3  . Magnesium 250 MG TABS Take 250 mg by mouth daily.    . meclizine (ANTIVERT) 25 MG tablet Take 25 mg by mouth 3 (three) times daily as needed for dizziness.     . metoprolol succinate (TOPROL-XL) 100 MG 24 hr tablet TAKE ONE (1) TABLET EACH DAY 90 tablet 1  . OVER THE COUNTER MEDICATION Apply 1 application topically daily as needed (pain). Oxyrub otc pain relieving gel    . oxyCODONE (OXY IR/ROXICODONE) 5 MG immediate release tablet Take 1 tablet (5 mg total) by mouth every 3 (three) hours as needed for moderate pain ((score 4 to 6)). 30 tablet 0  . potassium chloride (KLOR-CON) 10 MEQ tablet TAKE ONE (1) TABLET EACH DAY 90 tablet 1  . Propylene Glycol (SYSTANE BALANCE) 0.6 % SOLN Place 1 drop into both eyes daily.    . RABEprazole (ACIPHEX) 20 MG tablet TAKE ONE (1) TABLET EACH DAY 90 tablet 1  . rosuvastatin (CRESTOR) 40 MG tablet TAKE ONE (1) TABLET EACH DAY 90 tablet 2  . SitaGLIPtin-MetFORMIN HCl (JANUMET XR) [phone] MG TB24 TAKE 1 TABLET DAILY AT LUNCH 30 tablet 2  . solifenacin (VESICARE) 5 MG tablet TAKE ONE (1) TABLET EACH DAY (Patient taking differently: Take 5 mg by mouth  daily. ) 30 tablet 1  . tiZANidine (ZANAFLEX) 4 MG tablet Take 1 tablet (4 mg total) by mouth every 6 (six) hours as needed for muscle spasms. 60 tablet 0  . valACYclovir (VALTREX) 1000 MG tablet TAKE 2 TABS TWICE DAILY FOR 1 DAY THEN AS NEEDED 30 tablet 1  . metFORMIN (GLUCOPHAGE) 500 MG tablet TAKE ONE TABLET TWICE A DAY WITH FOOD 180 tablet 1   No facility-administered medications prior to visit.    Allergies  Allergen Reactions  . Flagyl [Metronidazole] Other (See Comments)    Caused increased heart rate  . Aspirin Effervescent Swelling    Alka-Seltzer gel caps- sore mouth, face swollen, turned hands white  . Morphine Sulfate Hives    Hallucinations  . Penicillins Swelling and Rash    Has patient had a PCN reaction causing immediate rash, facial/tongue/throat swelling, SOB or lightheadedness with hypotension: Yes Has patient had a PCN reaction causing severe rash involving mucus membranes or skin necrosis: No Has patient had a PCN reaction that required hospitalization No Has patient had a PCN reaction occurring within the last 10 years: No If all of the above answers are "NO", then may proceed with Cephalosporin use.     ROS Review of Systems  Constitutional: Negative for fatigue and unexpected weight change.  Eyes: Negative for visual disturbance.  Respiratory: Negative for  cough, chest tightness, shortness of breath and wheezing.   Cardiovascular: Negative for chest pain, palpitations and leg swelling.  Endocrine: Negative for polydipsia and polyuria.  Neurological: Negative for dizziness, seizures, syncope, weakness, light-headedness and headaches.      Objective:    Physical Exam Vitals reviewed.  Constitutional:      Appearance: Normal appearance.  Cardiovascular:     Rate and Rhythm: Normal rate and regular rhythm.  Pulmonary:     Effort: Pulmonary effort is normal.     Breath sounds: Normal breath sounds.  Musculoskeletal:     Comments: She has fairly large  ankles which is chronic but no pitting edema.  Left third toe reveals some diffuse bruising.  Minimal swelling.  Minimal tenderness.  No open skin wounds  Neurological:     Mental Status: She is alert.     BP 112/84   Pulse 69   Temp 97.8 F (36.6 C)   Ht 5\' 6"  (1.676 m)   Wt 220 lb 4.8 oz (99.9 kg)   SpO2 99%   BMI 35.56 kg/m  Wt Readings from Last 3 Encounters:  11/02/19 220 lb 4.8 oz (99.9 kg)  07/29/19 218 lb (98.9 kg)  07/02/19 216 lb 7 oz (98.2 kg)     There are no preventive care reminders to display for this patient.  There are no preventive care reminders to display for this patient.  Lab Results  Component Value Date   TSH 1.93 07/29/2019   Lab Results  Component Value Date   WBC 9.0 07/03/2019   HGB 10.3 (L) 07/03/2019   HCT 32.3 (L) 07/03/2019   MCV 80.8 07/03/2019   PLT 163 07/03/2019   Lab Results  Component Value Date   NA 137 07/03/2019   K 4.2 07/03/2019   CO2 25 07/03/2019   GLUCOSE 129 (H) 07/03/2019   BUN 14 07/03/2019   CREATININE 1.11 (H) 07/03/2019   BILITOT 0.6 01/26/2019   ALKPHOS 83 01/26/2019   AST 19 01/26/2019   ALT 11 01/26/2019   PROT 6.8 01/26/2019   ALBUMIN 4.4 01/26/2019   CALCIUM 8.8 (L) 07/03/2019   ANIONGAP 10 07/03/2019   GFR 49.93 (L) 01/26/2019   Lab Results  Component Value Date   CHOL 198 01/26/2019   Lab Results  Component Value Date   HDL 86.30 01/26/2019   Lab Results  Component Value Date   LDLCALC 96 01/26/2019   Lab Results  Component Value Date   TRIG 78.0 01/26/2019   Lab Results  Component Value Date   CHOLHDL 2 01/26/2019   Lab Results  Component Value Date   HGBA1C 6.6 (A) 11/02/2019      Assessment & Plan:   #1 left third toe pain.  No reported injury.  She has some diffuse bruising.  We discussed pros and cons of x-ray and we did not have x-ray availability today and she would like to observe for now.  We have suggested fairly stiff sole shoe to help take pressure off the toe  from bending.  She has a pair of shoes at home she thinks will be suitable for that.  She can return later week for x-ray if she desires  #2  Hypertension stable and at goal -Continue current medications  #3 type 2 diabetes improved with A1c today 6.6% -Continue Janumet  -We recommend a 64-month follow-up.  Obtain fasting lipids at that time  No orders of the defined types were placed in this encounter.   Follow-up:  Return in about 3 months (around 02/02/2020).    Carolann Littler, MD

## 2019-11-02 NOTE — Patient Instructions (Addendum)
  Recommend fairly stiff soled shoe to avoid excessive bend/stress on toe  We will call with the X-ray results.

## 2019-11-04 ENCOUNTER — Other Ambulatory Visit: Payer: Self-pay | Admitting: Endocrinology

## 2019-11-05 ENCOUNTER — Other Ambulatory Visit: Payer: Medicare Other

## 2019-11-05 ENCOUNTER — Other Ambulatory Visit: Payer: Self-pay

## 2019-11-05 ENCOUNTER — Ambulatory Visit (INDEPENDENT_AMBULATORY_CARE_PROVIDER_SITE_OTHER): Payer: Medicare Other

## 2019-11-05 DIAGNOSIS — M79675 Pain in left toe(s): Secondary | ICD-10-CM | POA: Diagnosis not present

## 2019-12-15 LAB — HM DIABETES EYE EXAM

## 2019-12-21 ENCOUNTER — Encounter: Payer: Self-pay | Admitting: *Deleted

## 2019-12-24 ENCOUNTER — Other Ambulatory Visit: Payer: Self-pay | Admitting: Endocrinology

## 2019-12-28 ENCOUNTER — Other Ambulatory Visit: Payer: Self-pay

## 2019-12-28 ENCOUNTER — Encounter: Payer: Self-pay | Admitting: Physical Therapy

## 2019-12-28 ENCOUNTER — Ambulatory Visit: Payer: Medicare Other | Attending: Physician Assistant | Admitting: Physical Therapy

## 2019-12-28 DIAGNOSIS — R262 Difficulty in walking, not elsewhere classified: Secondary | ICD-10-CM | POA: Insufficient documentation

## 2019-12-28 DIAGNOSIS — M25552 Pain in left hip: Secondary | ICD-10-CM | POA: Diagnosis present

## 2019-12-28 DIAGNOSIS — M25652 Stiffness of left hip, not elsewhere classified: Secondary | ICD-10-CM | POA: Insufficient documentation

## 2019-12-28 NOTE — Therapy (Signed)
[facility] [place], [place], [address] Phone: [phone]   Fax:  [phone]  Physical Therapy Evaluation  Patient Details  Name: Dawn Salas MRN: [id number] Date of Birth: [birth year] Referring Provider (PT): Gerrit Halls, PA-C   Encounter Date: 12/28/2019   PT End of Session - 12/28/19 1236    Visit Number 1    Number of Visits 8    Date for PT Re-Evaluation 02/01/20    Authorization Type [employer] Medicare (CQ modifier) FOTO; Progress note every 10th visit    PT Start Time 1030    PT Stop Time 1114    PT Time Calculation (min) 44 min    Activity Tolerance Patient tolerated treatment well    Behavior During Therapy [facility] for tasks assessed/performed           Past Medical History:  Diagnosis Date  . Allergy   . Anxiety   . Arthritis   . Asthma    patient denies   . Cataract   . Depression   . Diabetes mellitus    Type two  . Diverticulitis   . Family history of adverse reaction to anesthesia    daughter- N/V   . Frequency of urination   . GERD (gastroesophageal reflux disease)   . History of hiatal hernia   . Hyperglycemia   . Hyperlipidemia   . Hypertension   . Hyperthyroidism    PT HAD BIPOSY -NEGATIVE.Marland Kitchen AND NOW JUST GETS CHECKED EACH YEAR .Marland Kitchen 2013 LAST TEST   SEES DR. [name].   . Multiple meningiomas of spine and brain ([place])   . Obstructive sleep apnea    [facility] DOES NOT USE C PAP  . Palpitations   . Peptic ulcer disease   . Pneumonia    "walking pneumonia" hx of  . Seizures ([place]) 2015   one siezure with brain surgery none since 2015   . Sleep apnea    no CPAP    Past Surgical History:  Procedure Laterality Date  . ABDOMINAL HYSTERECTOMY    . BACK SURGERY     L SPIN 2003   NECK 2004  . BRAIN MENINGIOMA EXCISION  8/10   X 2  . C5-C6 neck fusion    . COLONOSCOPY  05/05/2009  . CRANIOTOMY  08/13/2011   Procedure: CRANIOTOMY TUMOR EXCISION;  Surgeon: [name], MD;  Location: [place] [place] [facility];  Service: Neurosurgery;  Laterality: Bilateral;  Bifrontal craniotomy for tumor  . DILATION AND CURETTAGE OF UTERUS     YEARS AGO  . L4-L5 posterior la    . left foot plantar and hammertoe    . right foot bunectomy    . right knee arthroscopy     x 3  . right shoulder rotator cuff    . ROBOTIC ASSISTED BILATERAL SALPINGO OOPHERECTOMY N/A 08/25/2015   Procedure: XI ROBOTIC ASSISTED BILATERAL SALPINGO OOPHORECTOMY;  Surgeon: [name], MD;  Location: [facility];  Service: Gynecology;  Laterality: N/A;  . TONSILLECTOMY    . TUBAL LIGATION    . WRIST SURGERY Right     There were no vitals filed for this visit.    Subjective Assessment - 12/28/19 1213    Subjective COVID-19 screening performed upon arrival. Patient arrives to phyiscal therapy with left hip pain that began insidiously about 3 months ago. Patient reports pain limits her ability to sleep on her left side and ascend steps. Patient utilizes a cane to go up steps  for stability. Patient reports pain at worst as 7/10 and pain at best as 0/10 with sitting. Patient's goals are to decrease pain, improve movmement, improve strength, sleep with less pain, and improve standing and walking tolerance.    Pertinent History HTN, DM, history of lumbar surgery 07/02/2019, history of brain surgery    Limitations Standing;Walking;House hold activities    How long can you sit comfortably? "no trouble"    How long can you stand comfortably? short periods on/off    How long can you walk comfortably? "30 mins"    Diagnostic tests x-ray: "bursitis"    Patient Stated Goals decrease pain, less pain with sleeping.    Currently in Pain? Yes    Pain Score 6     Pain Location Hip    Pain Orientation Left;Lateral    Pain Descriptors / Indicators Sore;Aching    Pain Type Acute pain    Pain Onset More than a month ago    Pain Frequency Constant    Aggravating Factors  sleeping on side, walking up steps    Pain  Relieving Factors ice, 2 tylenol in the morning    Effect of Pain on Daily Activities "when I sleep"              [facility] PT Assessment - 12/28/19 0001      Assessment   Medical Diagnosis Pain in left hip     Referring Provider (PT) Gerrit Halls, PA-C    Onset Date/Surgical Date --   "3 months" ~October 2021   Next MD Visit January    Prior Therapy no      Precautions   Precautions None      Restrictions   Weight Bearing Restrictions No      Balance Screen   Has the patient fallen in the past 6 months No    Has the patient had a decrease in activity level because of a fear of falling?  No    Is the patient reluctant to leave their home because of a fear of falling?  No      Home Social worker Private residence    Living Arrangements Children;Spouse/significant other      Prior Function   Level of Independence Independent with basic ADLs      Observation/Other Assessments   Observations limited R knee extension    Focus on Therapeutic Outcomes (FOTO)  26% limitation      ROM / Strength   AROM / PROM / Strength Strength;AROM      AROM   Overall AROM  Deficits;Due to pain    AROM Assessment Site Hip    Right/Left Hip Left    Left Hip Flexion 110    Left Hip External Rotation  35    Left Hip Internal Rotation  20    Left Hip ABduction 18      Strength   Overall Strength Deficits    Strength Assessment Site Hip;Knee    Right/Left Hip Right;Left    Right Hip Flexion 3+/5    Right Hip Extension 3+/5    Right Hip ABduction 3+/5    Left Hip Flexion 4-/5    Left Hip Extension 3-/5    Left Hip ABduction 3-/5    Right/Left Knee Right;Left    Right Knee Flexion 4-/5    Right Knee Extension 4-/5    Left Knee Flexion 4/5    Left Knee Extension 4/5      Palpation   Palpation  comment very tender to L greater trochanter, L IT band, upper and mid left glute      Transfers   Comments slow and cautious with bed mobility secondary to lumbar surgery       Ambulation/Gait   Gait Pattern Step-through pattern;Decreased step length - right;Decreased step length - left;Decreased stance time - right;Decreased stance time - left;Decreased stride length;Decreased hip/knee flexion - right;Decreased weight shift to right;Antalgic;Trunk flexed;Wide base of support      Standardized Balance Assessment   Standardized Balance Assessment Five Times Sit to Stand    Five times sit to stand comments  20.23 seconds no UE support                      Objective measurements completed on examination: See above findings.               PT Education - 12/28/19 1233    Education Details [place], bridging, piriformis stretch, glute sets    Person(s) Educated Patient    Methods Explanation;Handout    Comprehension Verbalized understanding;Returned demonstration               PT Long Term Goals - 12/28/19 1243      PT LONG TERM GOAL #1   Title Patient will be independent with HEP and its progression    Time 4    Period Weeks    Status New      PT LONG TERM GOAL #2   Title Patient will report ability to perform ADLs and home activities with L hip pain less than or equal to 3/10    Time 4    Period Weeks    Status New      PT LONG TERM GOAL #3   Title Patient will demonstrate 4+/5 or greater left hip MMT to improve stability during functional tasks.    Time 4    Period Weeks    Status New      PT LONG TERM GOAL #4   Title Patient will report ability to sleep on L side with hip pain less than or equal to 3/10.    Time 4    Period Weeks    Status New                  Plan - 12/28/19 1238    Clinical Impression Statement Patient is a 79 year old female who presents to physical therapy with left hip pain, difficulty with walking, and decreased left hip MMT that began about 3 months ago. Patient very tender to palpation to L greater trochanter, ITB, upper glute and middle left glute. Patient with limited L hip IR.  Patient ambulates with decreased stride length, R knee flexed in stance, and flexed trunk. Patient reports deficits with R LE and recent lumbar surgery that contributes to gait abnormalities. Patient and PT disucussed POC and HEP to which she reported understanding. Patient would benefit from skilled physical therapy to address deficits and goals.    Personal Factors and Comorbidities Age;Comorbidity 2    Comorbidities HTN, DM, history of lumbar surgery 07/02/2019, history of R knee surgery, history of brain surgery    Examination-Activity Limitations Bathing;Locomotion Level;Transfers;Sleep;Stairs;Stand;Dressing    Stability/Clinical Decision Making Stable/Uncomplicated    Clinical Decision Making Low    Rehab Potential Good    PT Frequency 2x / week    PT Duration 4 weeks    PT Treatment/Interventions Iontophoresis 4mg /ml Dexamethasone;ADLs/Self Care Home Management;Moist Heat;Ultrasound;Cryotherapy;Electrical Stimulation;Therapeutic exercise;Balance  training;Neuromuscular re-education;Manual techniques;Passive range of motion;Patient/family education;Gait training;Stair training;Functional mobility training;Therapeutic activities    PT Next Visit Plan nustep, hip strengthening and balance activities, iontophoresis and ITB stretching per referral, modalities PRN for pain relief    PT Home Exercise Plan see patient education section    Consulted and Agree with Plan of Care Patient           Patient will benefit from skilled therapeutic intervention in order to improve the following deficits and impairments:  Abnormal gait, Decreased range of motion, Difficulty walking, Pain, Decreased strength, Decreased balance, Decreased activity tolerance  Visit Diagnosis: Pain in left hip  Difficulty in walking, not elsewhere classified  Stiffness of left hip, not elsewhere classified     Problem List Patient Active Problem List   Diagnosis Date Noted  . Spinal stenosis of lumbar region with  neurogenic claudication 07/02/2019  . CKD (chronic kidney disease) stage 3, GFR 30-59 ml/min (HCC) 10/01/2017  . History of normocytic normochromic anemia 11/25/2015  . 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  . 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  . Spondylolisthesis of lumbar region 12/23/2013  . 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 Hyperthyroidism 05/25/2013  . Obesity (BMI 30-39.9) 12/30/2012  . 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[place]) 08/21/2011  . Meningioma ([place]) 08/13/2011  . DIZZINESS 11/02/2009  . Type 2 diabetes mellitus, controlled ([place]) 05/20/2009  . HYPOKALEMIA 05/02/2009  . DEPRESSION 06/24/2008  . GERD 06/24/2008  . PEPTIC ULCER DISEASE 06/24/2008  . TMJ SYNDROME 06/09/2008  . Hyperlipemia 11/08/2006  . OBSTRUCTIVE SLEEP APNEA 11/08/2006  . Essential hypertension 11/08/2006  . ALLERGIC RHINITIS 11/08/2006  . VOCAL CORD DISORDER 11/08/2006  . ASTHMA 11/08/2006  . TACHYARRHYTHMIA 11/08/2006    Gabriela Eves, PT, DPT 12/28/2019, 12:52 PM  [facility] [address] [place], [place], [address] Phone: [phone]   Fax:  [phone]  Name: Dawn Salas MRN: [id number] Date of Birth: Sep 01, 1940

## 2019-12-31 ENCOUNTER — Other Ambulatory Visit: Payer: Self-pay

## 2019-12-31 ENCOUNTER — Ambulatory Visit: Payer: Medicare Other | Admitting: Physical Therapy

## 2019-12-31 DIAGNOSIS — M25552 Pain in left hip: Secondary | ICD-10-CM | POA: Diagnosis not present

## 2019-12-31 DIAGNOSIS — R262 Difficulty in walking, not elsewhere classified: Secondary | ICD-10-CM

## 2019-12-31 DIAGNOSIS — M25652 Stiffness of left hip, not elsewhere classified: Secondary | ICD-10-CM

## 2019-12-31 NOTE — Therapy (Signed)
[facility] [address] [place], [place], [address] Phone: [phone]   Fax:  [phone]  Physical Therapy Treatment  Patient Details  Name: Dawn Salas MRN: [id number] Date of Birth: [birth year] Referring Provider (PT): Jodene Nam, PA-C   Encounter Date: 12/31/2019   PT End of Session - 12/31/19 1035    Visit Number 2    Number of Visits 8    Date for PT Re-Evaluation 02/01/20    Authorization Type [employer] Medicare (CQ modifier) FOTO; Progress note every 10th visit    PT Start Time 0945    PT Stop Time 1027    PT Time Calculation (min) 42 min    Activity Tolerance Patient tolerated treatment well    Behavior During Therapy [facility] for tasks assessed/performed           Past Medical History:  Diagnosis Date  . Allergy   . Anxiety   . Arthritis   . Asthma    patient denies   . Cataract   . Depression   . Diabetes mellitus    Type two  . Diverticulitis   . Family history of adverse reaction to anesthesia    daughter- N/V   . Frequency of urination   . GERD (gastroesophageal reflux disease)   . History of hiatal hernia   . Hyperglycemia   . Hyperlipidemia   . Hypertension   . Hyperthyroidism    PT HAD BIPOSY -NEGATIVE.Marland Kitchen AND NOW JUST GETS CHECKED EACH YEAR .Marland Kitchen 2013 LAST TEST   SEES DR. [name].   . 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 Obstructive sleep apnea    [facility] DOES NOT USE C PAP  . Palpitations   . Peptic ulcer disease   . Pneumonia    "walking pneumonia" hx of  . Seizures ([facility]) 2015   one siezure with brain surgery none since 2015   . Sleep apnea    no CPAP    Past Surgical History:  Procedure Laterality Date  . ABDOMINAL HYSTERECTOMY    . BACK SURGERY     L SPIN 2003   NECK 2004  . BRAIN MENINGIOMA EXCISION  8/10   X 2  . C5-C6 neck fusion    . COLONOSCOPY  05/05/2009  . CRANIOTOMY  08/13/2011   Procedure: CRANIOTOMY TUMOR EXCISION;  Surgeon: Cristi Loron,  MD;  Location: [facility];  Service: Neurosurgery;  Laterality: Bilateral;  Bifrontal craniotomy for tumor  . DILATION AND CURETTAGE OF UTERUS     YEARS AGO  . L4-L5 posterior la    . left foot plantar and hammertoe    . right foot bunectomy    . right knee arthroscopy     x 3  . right shoulder rotator cuff    . ROBOTIC ASSISTED BILATERAL SALPINGO OOPHERECTOMY N/A 08/25/2015   Procedure: XI ROBOTIC ASSISTED BILATERAL SALPINGO OOPHORECTOMY;  Surgeon: Laurette Schimke, MD;  Location: [facility];  Service: Gynecology;  Laterality: N/A;  . TONSILLECTOMY    . TUBAL LIGATION    . WRIST SURGERY Right     There were no vitals filed for this visit.   Subjective Assessment - 12/31/19 0953    Subjective COVID-19 screening performed upon arrival. patient reported some soreness today    Pertinent History HTN, DM, history of lumbar surgery 07/02/2019, history of brain surgery    Limitations Standing;Walking;House hold activities    How long can you sit comfortably? "no trouble"  How long can you stand comfortably? short periods on/off    How long can you walk comfortably? "30 mins"    Diagnostic tests x-ray: "bursitis"    Patient Stated Goals decrease pain, less pain with sleeping.    Currently in Pain? Yes    Pain Score 6     Pain Location Hip    Pain Orientation Left    Pain Type Acute pain    Pain Onset More than a month ago    Pain Frequency Constant    Aggravating Factors  certain movements and activity    Pain Relieving Factors rest and ice                             [facility] Adult PT Treatment/Exercise - 12/31/19 0001      Exercises   Exercises Knee/Hip;Lumbar      Lumbar Exercises: Aerobic   Nustep L2 x50min UE/LE activity      Modalities   Modalities Iontophoresis;Ultrasound      Ultrasound   Ultrasound Location left hip    Ultrasound Parameters [place] @ w/cm2/50%/79mhz x59min    Ultrasound Goals Pain      Iontophoresis   Type of Iontophoresis Dexamethasone     Location left hip    Dose 1.0 1 of 6    Time 8      Manual Therapy   Manual Therapy Soft tissue mobilization    Manual therapy comments manual STW to left hip to reduce pain and tone, gentle pressure due to tender with palpation                       PT Long Term Goals - 12/31/19 1104      PT LONG TERM GOAL #1   Title Patient will be independent with HEP and its progression    Time 4    Period Weeks    Status On-going      PT LONG TERM GOAL #2   Title Patient will report ability to perform ADLs and home activities with L hip pain less than or equal to 3/10    Time 4    Period Weeks    Status On-going      PT LONG TERM GOAL #3   Title Patient will demonstrate 4+/5 or greater left hip MMT to improve stability during functional tasks.    Time 4    Period Weeks    Status On-going      PT LONG TERM GOAL #4   Title Patient will report ability to sleep on L side with hip pain less than or equal to 3/10.    Time 4    Period Weeks    Status On-going                 Plan - 12/31/19 1037    Clinical Impression Statement Patient tolerated treatment well today. Patient reported ongoing pain in left hip. Today started light exercises followed by manual STW and modalities to help reduce pain and tone. Patient very tender to palpation today and required light pressure with STW. Patient current goals are progressing and will progress per tolerance and POC.    Personal Factors and Comorbidities Age;Comorbidity 2    Comorbidities HTN, DM, history of lumbar surgery 07/02/2019, history of R knee surgery, history of brain surgery    Examination-Activity Limitations Bathing;Locomotion Level;Transfers;Sleep;Stairs;Stand;Dressing    Stability/Clinical Decision Making Stable/Uncomplicated  Rehab Potential Good    PT Frequency 2x / week    PT Duration 4 weeks    PT Treatment/Interventions Iontophoresis 4mg /ml Dexamethasone;ADLs/Self Care Home Management;Moist  Heat;Ultrasound;Cryotherapy;Electrical Stimulation;Therapeutic exercise;Balance training;Neuromuscular re-education;Manual techniques;Passive range of motion;Patient/family education;Gait training;Stair training;Functional mobility training;Therapeutic activities    PT Next Visit Plan nustep, hip strengthening and balance activities, iontophoresis and ITB stretching per referral, modalities PRN for pain relief    Consulted and Agree with Plan of Care Patient           Patient will benefit from skilled therapeutic intervention in order to improve the following deficits and impairments:  Abnormal gait,Decreased range of motion,Difficulty walking,Pain,Decreased strength,Decreased balance,Decreased activity tolerance  Visit Diagnosis: Pain in left hip  Difficulty in walking, not elsewhere classified  Stiffness of left hip, not elsewhere classified     Problem List Patient Active Problem List   Diagnosis Date Noted  . 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  . CKD (chronic kidney disease) stage 3, GFR 30-59 ml/min (HCC) 10/01/2017  . History of normocytic normochromic anemia 11/25/2015  . 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  . 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  . Spondylolisthesis of lumbar region 12/23/2013  . 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 Hyperthyroidism 05/25/2013  . Obesity (BMI 30-39.9) 12/30/2012  . 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[facility]CC) 08/21/2011  . Meningioma (HCC) 08/13/2011  . DIZZINESS 11/02/2009  . Type 2 diabetes mellitus, controlled (HCC) 05/20/2009  . HYPOKALEMIA 05/02/2009  . DEPRESSION 06/24/2008  . GERD 06/24/2008  . PEPTIC ULCER DISEASE 06/24/2008  . TMJ SYNDROME 06/09/2008  . Hyperlipemia 11/08/2006  . OBSTRUCTIVE SLEEP APNEA 11/08/2006  . Essential hypertension 11/08/2006  . ALLERGIC RHINITIS 11/08/2006  . VOCAL CORD DISORDER 11/08/2006  . ASTHMA 11/08/2006  . TACHYARRHYTHMIA 11/08/2006    Antoin Dargis,  Allex Lapoint P, PTA 12/31/2019, 11:04 AM  [facility] [address] [place], [place], [address] Phone: [phone]   Fax:  [phone]  Name: JAYLIANIS DURTSCHI MRN: [id number] Date of Birth: 1940/11/13

## 2020-01-02 ENCOUNTER — Other Ambulatory Visit: Payer: Self-pay | Admitting: Family Medicine

## 2020-01-04 ENCOUNTER — Ambulatory Visit: Payer: Medicare Other | Admitting: Physical Therapy

## 2020-01-04 ENCOUNTER — Other Ambulatory Visit: Payer: Self-pay

## 2020-01-04 DIAGNOSIS — M25552 Pain in left hip: Secondary | ICD-10-CM | POA: Diagnosis not present

## 2020-01-04 DIAGNOSIS — M25652 Stiffness of left hip, not elsewhere classified: Secondary | ICD-10-CM

## 2020-01-04 DIAGNOSIS — R262 Difficulty in walking, not elsewhere classified: Secondary | ICD-10-CM

## 2020-01-04 NOTE — Therapy (Signed)
[facility] [address] [place], [place], [address] Phone: [phone]   Fax:  [phone]  Physical Therapy Treatment  Patient Details  Name: Dawn Salas MRN: [id number] Date of Birth: 1940/09/08 Referring Provider (PT): Jodene Nam, PA-C   Encounter Date: 01/04/2020   PT End of Session - 01/04/20 1056    Visit Number 3    Number of Visits 8    Date for PT Re-Evaluation 02/01/20    Authorization Type [employer] Medicare (CQ modifier) FOTO; Progress note every 10th visit    PT Start Time 1030    PT Stop Time 1113    PT Time Calculation (min) 43 min    Activity Tolerance Patient tolerated treatment well    Behavior During Therapy [facility] for tasks assessed/performed           Past Medical History:  Diagnosis Date  . Allergy   . Anxiety   . Arthritis   . Asthma    patient denies   . Cataract   . Depression   . Diabetes mellitus    Type two  . Diverticulitis   . Family history of adverse reaction to anesthesia    daughter- N/V   . Frequency of urination   . GERD (gastroesophageal reflux disease)   . History of hiatal hernia   . Hyperglycemia   . Hyperlipidemia   . Hypertension   . Hyperthyroidism    PT HAD BIPOSY -NEGATIVE.Marland Kitchen AND NOW JUST GETS CHECKED EACH YEAR .Marland Kitchen 2013 LAST TEST   SEES DR. [name].   . 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 Obstructive sleep apnea    STUDY AT [facility] DOES NOT USE C PAP  . Palpitations   . Peptic ulcer disease   . Pneumonia    "walking pneumonia" hx of  . Seizures (HCC) 2015   one siezure with brain surgery none since 2015   . Sleep apnea    no CPAP    Past Surgical History:  Procedure Laterality Date  . ABDOMINAL HYSTERECTOMY    . BACK SURGERY     L SPIN 2003   NECK 2004  . BRAIN MENINGIOMA EXCISION  8/10   X 2  . C5-C6 neck fusion    . COLONOSCOPY  05/05/2009  . CRANIOTOMY  08/13/2011   Procedure: CRANIOTOMY TUMOR EXCISION;  Surgeon: Cristi Loron, MD;  Location: [facility];  Service: Neurosurgery;  Laterality: Bilateral;  Bifrontal craniotomy for tumor  . DILATION AND CURETTAGE OF UTERUS     YEARS AGO  . L4-L5 posterior la    . left foot plantar and hammertoe    . right foot bunectomy    . right knee arthroscopy     x 3  . right shoulder rotator cuff    . ROBOTIC ASSISTED BILATERAL SALPINGO OOPHERECTOMY N/A 08/25/2015   Procedure: XI ROBOTIC ASSISTED BILATERAL SALPINGO OOPHORECTOMY;  Surgeon: Laurette Schimke, MD;  Location: [facility];  Service: Gynecology;  Laterality: N/A;  . TONSILLECTOMY    . TUBAL LIGATION    . WRIST SURGERY Right     There were no vitals filed for this visit.   Subjective Assessment - 01/04/20 1040    Subjective COVID-19 screening performed upon arrival. Patient arrived with good response after last treatment.    Pertinent History HTN, DM, history of lumbar surgery 07/02/2019, history of brain surgery    Limitations Standing;Walking;House hold activities    How long can you sit comfortably? "  no trouble"    How long can you stand comfortably? short periods on/off    How long can you walk comfortably? "30 mins"    Diagnostic tests x-ray: "bursitis"    Patient Stated Goals decrease pain, less pain with sleeping.    Currently in Pain? Yes    Pain Score 1     Pain Location Hip    Pain Orientation Left    Pain Descriptors / Indicators Discomfort;Tender    Pain Type Acute pain    Pain Onset More than a month ago    Pain Frequency Intermittent    Aggravating Factors  laying on hip or palpation    Pain Relieving Factors rest/ice                             [facility] Adult PT Treatment/Exercise - 01/04/20 0001      Lumbar Exercises: Aerobic   Nustep L4 x15min      Knee/Hip Exercises: Stretches   ITB Stretch Left;3 reps;20 seconds      Knee/Hip Exercises: Standing   Heel Raises Both;20 reps    Hip Abduction Stengthening;Both;1 set;10 reps;Knee straight    Hip Extension  Stengthening;Both;1 set;10 reps;Knee straight      Ultrasound   Ultrasound Location left hip    Ultrasound Parameters [place] @ 1.5/w/cm2/50%/56mhz x34min    Ultrasound Goals Pain      Iontophoresis   Type of Iontophoresis Dexamethasone    Location left hip    Dose 1.0 2 of 6    Time 8                       PT Long Term Goals - 12/31/19 1104      PT LONG TERM GOAL #1   Title Patient will be independent with HEP and its progression    Time 4    Period Weeks    Status On-going      PT LONG TERM GOAL #2   Title Patient will report ability to perform ADLs and home activities with L hip pain less than or equal to 3/10    Time 4    Period Weeks    Status On-going      PT LONG TERM GOAL #3   Title Patient will demonstrate 4+/5 or greater left hip MMT to improve stability during functional tasks.    Time 4    Period Weeks    Status On-going      PT LONG TERM GOAL #4   Title Patient will report ability to sleep on L side with hip pain less than or equal to 3/10.    Time 4    Period Weeks    Status On-going                 Plan - 01/04/20 1113    Clinical Impression Statement Patient arrived with little pain and responded well after last treatment. Patient able to progress with light exercises and stretches today with no increased discomfort. Patient has reported more pain with palpation and able to sleep longer than she was able to prior. Patient responded well to ionto. Goals progressing.    Personal Factors and Comorbidities Age;Comorbidity 2    Comorbidities HTN, DM, history of lumbar surgery 07/02/2019, history of R knee surgery, history of brain surgery    Examination-Activity Limitations Bathing;Locomotion Level;Transfers;Sleep;Stairs;Stand;Dressing    Stability/Clinical Decision Making Stable/Uncomplicated    Rehab Potential  Good    PT Frequency 2x / week    PT Duration 4 weeks    PT Treatment/Interventions Iontophoresis 4mg /ml Dexamethasone;ADLs/Self  Care Home Management;Moist Heat;Ultrasound;Cryotherapy;Electrical Stimulation;Therapeutic exercise;Balance training;Neuromuscular re-education;Manual techniques;Passive range of motion;Patient/family education;Gait training;Stair training;Functional mobility training;Therapeutic activities    PT Next Visit Plan cont with POC for hip strengthening and balance activities, iontophoresis and ITB stretching per referral, modalities PRN for pain relief    Consulted and Agree with Plan of Care Patient           Patient will benefit from skilled therapeutic intervention in order to improve the following deficits and impairments:  Abnormal gait,Decreased range of motion,Difficulty walking,Pain,Decreased strength,Decreased balance,Decreased activity tolerance  Visit Diagnosis: Pain in left hip  Difficulty in walking, not elsewhere classified  Stiffness of left hip, not elsewhere classified     Problem List Patient Active Problem List   Diagnosis Date Noted  . 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  . CKD (chronic kidney disease) stage 3, GFR 30-59 ml/min (HCC) 10/01/2017  . History of normocytic normochromic anemia 11/25/2015  . 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  . 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  . Spondylolisthesis of lumbar region 12/23/2013  . 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 Hyperthyroidism 05/25/2013  . Obesity (BMI 30-39.9) 12/30/2012  . 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HCC) 08/21/2011  . Meningioma (HCC) 08/13/2011  . DIZZINESS 11/02/2009  . Type 2 diabetes mellitus, controlled (HCC) 05/20/2009  . HYPOKALEMIA 05/02/2009  . DEPRESSION 06/24/2008  . GERD 06/24/2008  . PEPTIC ULCER DISEASE 06/24/2008  . TMJ SYNDROME 06/09/2008  . Hyperlipemia 11/08/2006  . OBSTRUCTIVE SLEEP APNEA 11/08/2006  . Essential hypertension 11/08/2006  . ALLERGIC RHINITIS 11/08/2006  . VOCAL CORD DISORDER 11/08/2006  . ASTHMA 11/08/2006  .  TACHYARRHYTHMIA 11/08/2006    Johnson Arizola P, PTA 01/04/2020, 11:22 AM  [facility] [address] [place], [place], [address] Phone: [phone]   Fax:  [phone]  Name: Dawn Salas MRN: [id number] Date of Birth: 28-Oct-1940

## 2020-01-07 ENCOUNTER — Ambulatory Visit: Payer: Medicare Other | Admitting: Physical Therapy

## 2020-01-07 ENCOUNTER — Other Ambulatory Visit: Payer: Self-pay

## 2020-01-07 DIAGNOSIS — R262 Difficulty in walking, not elsewhere classified: Secondary | ICD-10-CM

## 2020-01-07 DIAGNOSIS — M25652 Stiffness of left hip, not elsewhere classified: Secondary | ICD-10-CM

## 2020-01-07 DIAGNOSIS — M25552 Pain in left hip: Secondary | ICD-10-CM | POA: Diagnosis not present

## 2020-01-07 NOTE — Therapy (Signed)
[facility] [address] [place], [place], [address] Phone: [phone]   Fax:  [phone]  Physical Therapy Treatment  Patient Details  Name: Dawn Salas MRN: [id number] Date of Birth: [birth year] Referring Provider (PT): Jodene Nam, PA-C   Encounter Date: 01/07/2020   PT End of Session - 01/07/20 1124    Visit Number 4    Number of Visits 8    Date for PT Re-Evaluation 02/01/20    Authorization Type [employer] Medicare (CQ modifier) FOTO 4th 24%; Progress note every 10th visit    PT Start Time 1030    PT Stop Time 1114    PT Time Calculation (min) 44 min    Activity Tolerance Patient tolerated treatment well    Behavior During Therapy [facility] for tasks assessed/performed           Past Medical History:  Diagnosis Date  . Allergy   . Anxiety   . Arthritis   . Asthma    patient denies   . Cataract   . Depression   . Diabetes mellitus    Type two  . Diverticulitis   . Family history of adverse reaction to anesthesia    daughter- N/V   . Frequency of urination   . GERD (gastroesophageal reflux disease)   . History of hiatal hernia   . Hyperglycemia   . Hyperlipidemia   . Hypertension   . Hyperthyroidism    PT HAD BIPOSY -NEGATIVE.Marland Kitchen AND NOW JUST GETS CHECKED EACH YEAR .Marland Kitchen 2013 LAST TEST   SEES DR. [name].   . 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 Obstructive sleep apnea    [facility]UDY [facility] DOES NOT USE C PAP  . Palpitations   . Peptic ulcer disease   . Pneumonia    "walking pneumonia" hx of  . Seizures (HCC) 2015   one siezure with brain surgery none since 2015   . Sleep apnea    no CPAP    Past Surgical History:  Procedure Laterality Date  . ABDOMINAL HYSTERECTOMY    . BACK SURGERY     L SPIN 2003   NECK 2004  . BRAIN MENINGIOMA EXCISION  8/10   X 2  . C5-C6 neck fusion    . COLONOSCOPY  05/05/2009  . CRANIOTOMY  08/13/2011   Procedure: CRANIOTOMY TUMOR EXCISION;  Surgeon: Cristi Loron, MD;  Location: [facility];  Service: Neurosurgery;  Laterality: Bilateral;  Bifrontal craniotomy for tumor  . DILATION AND CURETTAGE OF UTERUS     YEARS AGO  . L4-L5 posterior la    . left foot plantar and hammertoe    . right foot bunectomy    . right knee arthroscopy     x 3  . right shoulder rotator cuff    . ROBOTIC ASSISTED BILATERAL SALPINGO OOPHERECTOMY N/A 08/25/2015   Procedure: XI ROBOTIC ASSISTED BILATERAL SALPINGO OOPHORECTOMY;  Surgeon: Laurette Schimke, MD;  Location: [facility];  Service: Gynecology;  Laterality: N/A;  . TONSILLECTOMY    . TUBAL LIGATION    . WRIST SURGERY Right     There were no vitals filed for this visit.   Subjective Assessment - 01/07/20 1030    Subjective COVID-19 screening performed upon arrival. Patient reported no pain and doing well today.    Pertinent History HTN, DM, history of lumbar surgery 07/02/2019, history of brain surgery    Limitations Standing;Walking;House hold activities    How long can you  sit comfortably? "no trouble"    How long can you stand comfortably? short periods on/off    How long can you walk comfortably? "30 mins"    Diagnostic tests x-ray: "bursitis"    Patient Stated Goals decrease pain, less pain with sleeping.    Currently in Pain? No/denies                             [facility] Adult PT Treatment/Exercise - 01/07/20 0001      Lumbar Exercises: Aerobic   Nustep L4 x18min      Knee/Hip Exercises: Stretches   ITB Stretch Left;3 reps;20 seconds      Knee/Hip Exercises: Standing   Heel Raises Both;20 reps    Hip Abduction Stengthening;Both;1 set;10 reps;Knee straight    Hip Extension Stengthening;Both;1 set;10 reps;Knee straight      Knee/Hip Exercises: Seated   Sit to Sand 10 reps;without UE support      Knee/Hip Exercises: Sidelying   Hip ABduction Strengthening;Left;15 reps;1 set      Ultrasound   Ultrasound Location left hip    Ultrasound Parameters [place] @ 1.5w/cm2/50%/65mhz x14min     Ultrasound Goals Pain      Iontophoresis   Type of Iontophoresis Dexamethasone    Location left hip    Dose 1.0 / 3 of 6    Time 8                  PT Education - 01/07/20 1058    Education Details HEP progression    Person(s) Educated Patient    Methods Explanation;Demonstration;Handout    Comprehension Verbalized understanding;Returned demonstration               PT Long Term Goals - 01/07/20 1040      PT LONG TERM GOAL #1   Title Patient will be independent with HEP and its progression    Baseline Met 01/07/20    Time 4    Period Weeks    Status Achieved      PT LONG TERM GOAL #2   Title Patient will report ability to perform ADLs and home activities with L hip pain less than or equal to 3/10    Baseline no pain the past few visits 01/07/20    Time 4    Period Weeks    Status Partially Met      PT LONG TERM GOAL #3   Title Patient will demonstrate 4+/5 or greater left hip MMT to improve stability during functional tasks.    Time 4    Period Weeks    Status On-going      PT LONG TERM GOAL #4   Title Patient will report ability to sleep on L side with hip pain less than or equal to 3/10.    Baseline less discomfort 01/07/20    Time 4    Period Weeks    Status On-going                 Plan - 01/07/20 1124    Clinical Impression Statement Patient tolerated treatment well today. Patient has continued to respond well to Ionto and exercises. No Pain reported today and no pain with ADL's. Patient has less palpable pain in left hip yet ongoing and some difficulty laying on side. Patient given HEP for home progression. Patient Met LTG #1 and progressing toward remaining goals.    Personal Factors and Comorbidities Age;Comorbidity 2  Comorbidities HTN, DM, history of lumbar surgery 07/02/2019, history of R knee surgery, history of brain surgery    Examination-Activity Limitations Bathing;Locomotion Level;Transfers;Sleep;Stairs;Stand;Dressing     Stability/Clinical Decision Making Stable/Uncomplicated    Rehab Potential Good    PT Frequency 2x / week    PT Duration 4 weeks    PT Treatment/Interventions Iontophoresis 4mg /ml Dexamethasone;ADLs/Self Care Home Management;Moist Heat;Ultrasound;Cryotherapy;Electrical Stimulation;Therapeutic exercise;Balance training;Neuromuscular re-education;Manual techniques;Passive range of motion;Patient/family education;Gait training;Stair training;Functional mobility training;Therapeutic activities    PT Next Visit Plan cont with POC for hip strengthening and balance activities, iontophoresis and ITB stretching per referral, modalities PRN for pain relief    Consulted and Agree with Plan of Care Patient           Patient will benefit from skilled therapeutic intervention in order to improve the following deficits and impairments:  Abnormal gait,Decreased range of motion,Difficulty walking,Pain,Decreased strength,Decreased balance,Decreased activity tolerance  Visit Diagnosis: Pain in left hip  Difficulty in walking, not elsewhere classified  Stiffness of left hip, not elsewhere classified     Problem List Patient Active Problem List   Diagnosis Date Noted  . 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  . CKD (chronic kidney disease) stage 3, GFR 30-59 ml/min (HCC) 10/01/2017  . History of normocytic normochromic anemia 11/25/2015  . 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  . 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  . Spondylolisthesis of lumbar region 12/23/2013  . 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 Hyperthyroidism 05/25/2013  . Obesity (BMI 30-39.9) 12/30/2012  . 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HCC) 08/21/2011  . Meningioma (HCC) 08/13/2011  . DIZZINESS 11/02/2009  . Type 2 diabetes mellitus, controlled (HCC) 05/20/2009  . HYPOKALEMIA 05/02/2009  . DEPRESSION 06/24/2008  . GERD 06/24/2008  . PEPTIC ULCER DISEASE 06/24/2008  . TMJ SYNDROME  06/09/2008  . Hyperlipemia 11/08/2006  . OBSTRUCTIVE SLEEP APNEA 11/08/2006  . Essential hypertension 11/08/2006  . ALLERGIC RHINITIS 11/08/2006  . VOCAL CORD DISORDER 11/08/2006  . ASTHMA 11/08/2006  . TACHYARRHYTHMIA 11/08/2006    Cortni Tays P, PTA 01/07/2020, 11:59 AM  [facility] [address] [place], [place], [address] Phone: [phone]   Fax:  [phone]  Name: TYKIA PASSALACQUA MRN: [id number] Date of Birth: Apr 18, 1940

## 2020-01-07 NOTE — Patient Instructions (Signed)
  Lower Body: Toe Rise   Standing, place feet apart. Hold arms out for balance or use support. Rise up on toes. Hold _3___ seconds, then lower. Repeat immediately. Repeat __10__ times. Do __2__ sessions per day.  Half Squat to Chair   Stand with feet shoulder width apart. Push buttocks backward and lower slowly, sitting in chair lightly and returning to standing position. Complete _2_ sets of 10_ repetitions. Perform __2-3_ sessions per day.   ABDUCTION: Standing (Active)   Stand, feet flat. Lift right leg out to side. Use _0__ lbs. Complete __10-30_ repetitions. Perform __2_ sessions per day.  EXTENSION: Standing (Active)  Stand, both feet flat. Draw right leg behind body as far as possible. Use 0___ lbs. Complete 10-30 repetitions. Perform __2_ sessions per day.     Strengthening: Hip Abduction (Side-Lying)  Strengthening: Straight Leg Raise (Phase 1)  Repeat _10___ times per set. Do __2__ sets per session. Do __2__ sessions per day.  Pelvic Tilt: Posterior - Legs Bent (Supine)

## 2020-01-11 ENCOUNTER — Ambulatory Visit: Payer: Medicare Other | Admitting: *Deleted

## 2020-01-12 ENCOUNTER — Ambulatory Visit: Payer: Medicare Other | Admitting: *Deleted

## 2020-01-12 ENCOUNTER — Other Ambulatory Visit: Payer: Self-pay

## 2020-01-12 DIAGNOSIS — R262 Difficulty in walking, not elsewhere classified: Secondary | ICD-10-CM

## 2020-01-12 DIAGNOSIS — M25552 Pain in left hip: Secondary | ICD-10-CM

## 2020-01-12 DIAGNOSIS — M25652 Stiffness of left hip, not elsewhere classified: Secondary | ICD-10-CM

## 2020-01-12 NOTE — Therapy (Signed)
[facility] [address] [place], [place], [address] Phone: [phone]   Fax:  [phone]  Physical Therapy Treatment  Patient Details  Name: Dawn Salas MRN: [id number] Date of Birth: 1940-02-16 Referring Provider (PT): Jodene Nam, PA-C   Encounter Date: 01/12/2020   PT End of Session - 01/12/20 1526    Visit Number 5    Number of Visits 8    Date for PT Re-Evaluation 02/01/20    Authorization Type [employer] Medicare (CQ modifier) FOTO 4th 24%; Progress note every 10th visit    PT Start Time 1515    PT Stop Time 1604    PT Time Calculation (min) 49 min           Past Medical History:  Diagnosis Date  . Allergy   . Anxiety   . Arthritis   . Asthma    patient denies   . Cataract   . Depression   . Diabetes mellitus    Type two  . Diverticulitis   . Family history of adverse reaction to anesthesia    daughter- N/V   . Frequency of urination   . GERD (gastroesophageal reflux disease)   . History of hiatal hernia   . Hyperglycemia   . Hyperlipidemia   . Hypertension   . Hyperthyroidism    PT HAD BIPOSY -NEGATIVE.Marland Kitchen AND NOW JUST GETS CHECKED EACH YEAR .Marland Kitchen 2013 LAST TEST   SEES DR. [name].   . 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 Obstructive sleep apnea    [facility]UDY [facility] DOES NOT USE C PAP  . Palpitations   . Peptic ulcer disease   . Pneumonia    "walking pneumonia" hx of  . Seizures (HCC) 2015   one siezure with brain surgery none since 2015   . Sleep apnea    no CPAP    Past Surgical History:  Procedure Laterality Date  . ABDOMINAL HYSTERECTOMY    . BACK SURGERY     L SPIN 2003   NECK 2004  . BRAIN MENINGIOMA EXCISION  8/10   X 2  . C5-C6 neck fusion    . COLONOSCOPY  05/05/2009  . CRANIOTOMY  08/13/2011   Procedure: CRANIOTOMY TUMOR EXCISION;  Surgeon: Cristi Loron, MD;  Location: [facility];  Service: Neurosurgery;  Laterality: Bilateral;  Bifrontal craniotomy for  tumor  . DILATION AND CURETTAGE OF UTERUS     YEARS AGO  . L4-L5 posterior la    . left foot plantar and hammertoe    . right foot bunectomy    . right knee arthroscopy     x 3  . right shoulder rotator cuff    . ROBOTIC ASSISTED BILATERAL SALPINGO OOPHERECTOMY N/A 08/25/2015   Procedure: XI ROBOTIC ASSISTED BILATERAL SALPINGO OOPHORECTOMY;  Surgeon: Laurette Schimke, MD;  Location: [facility];  Service: Gynecology;  Laterality: N/A;  . TONSILLECTOMY    . TUBAL LIGATION    . WRIST SURGERY Right     There were no vitals filed for this visit.   Subjective Assessment - 01/12/20 1524    Subjective COVID-19 screening performed upon arrival. Patient reported no pain and doing well today.    Pertinent History HTN, DM, history of lumbar surgery 07/02/2019, history of brain surgery    Limitations Standing;Walking;House hold activities    How long can you sit comfortably? "no trouble"    How long can you stand comfortably? short periods on/off  How long can you walk comfortably? "30 mins"    Diagnostic tests x-ray: "bursitis"    Patient Stated Goals decrease pain, less pain with sleeping.    Currently in Pain? No/denies    Pain Score 1     Pain Location Hip    Pain Orientation Left    Pain Descriptors / Indicators Discomfort    Pain Type Acute pain    Pain Onset More than a month ago                             [facility] Adult PT Treatment/Exercise - 01/12/20 0001      Lumbar Exercises: Aerobic   Nustep L4 x9min      Knee/Hip Exercises: Stretches   ITB Stretch Left;3 reps;20 seconds      Knee/Hip Exercises: Standing   Heel Raises Both;20 reps    Hip Abduction Stengthening;Both;1 set;10 reps;Knee straight    Hip Extension Stengthening;Both;1 set;10 reps;Knee straight      Knee/Hip Exercises: Sidelying   Hip ABduction Strengthening;Left;2 sets;10 reps      Ultrasound   Ultrasound Location LT hip    Ultrasound Parameters [place] @ 1.5 w/ cm2    Ultrasound Goals Pain       Iontophoresis   Type of Iontophoresis Dexamethasone    Location left hip    Dose 1.0 / 4 of 6    Time 8                       PT Long Term Goals - 01/07/20 1040      PT LONG TERM GOAL #1   Title Patient will be independent with HEP and its progression    Baseline Met 01/07/20    Time 4    Period Weeks    Status Achieved      PT LONG TERM GOAL #2   Title Patient will report ability to perform ADLs and home activities with L hip pain less than or equal to 3/10    Baseline no pain the past few visits 01/07/20    Time 4    Period Weeks    Status Partially Met      PT LONG TERM GOAL #3   Title Patient will demonstrate 4+/5 or greater left hip MMT to improve stability during functional tasks.    Time 4    Period Weeks    Status On-going      PT LONG TERM GOAL #4   Title Patient will report ability to sleep on L side with hip pain less than or equal to 3/10.    Baseline less discomfort 01/07/20    Time 4    Period Weeks    Status On-going                 Plan - 01/12/20 1527    Clinical Impression Statement Pt arrived today still doing well with mainly soreness LT hip. She did well with exs and modalities for LT hip f/b ionto patch. Pt did well with Rx.    Comorbidities HTN, DM, history of lumbar surgery 07/02/2019, history of R knee surgery, history of brain surgery    Examination-Activity Limitations Bathing;Locomotion Level;Transfers;Sleep;Stairs;Stand;Dressing    Stability/Clinical Decision Making Stable/Uncomplicated    Rehab Potential Good    PT Frequency 2x / week    PT Duration 4 weeks    PT Treatment/Interventions Iontophoresis 4mg /ml Dexamethasone;ADLs/Self Care Home Management;Moist Heat;Ultrasound;Cryotherapy;Electrical Stimulation;Therapeutic  exercise;Balance training;Neuromuscular re-education;Manual techniques;Passive range of motion;Patient/family education;Gait training;Stair training;Functional mobility training;Therapeutic activities     PT Next Visit Plan cont with POC for hip strengthening and balance activities, iontophoresis and ITB stretching per referral, modalities PRN for pain relief    PT Home Exercise Plan see patient education section           Patient will benefit from skilled therapeutic intervention in order to improve the following deficits and impairments:  Abnormal gait,Decreased range of motion,Difficulty walking,Pain,Decreased strength,Decreased balance,Decreased activity tolerance  Visit Diagnosis: Pain in left hip  Difficulty in walking, not elsewhere classified  Stiffness of left hip, not elsewhere classified     Problem List Patient Active Problem List   Diagnosis Date Noted  . 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  . CKD (chronic kidney disease) stage 3, GFR 30-59 ml/min (HCC) 10/01/2017  . History of normocytic normochromic anemia 11/25/2015  . 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  . 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  . Spondylolisthesis of lumbar region 12/23/2013  . 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 Hyperthyroidism 05/25/2013  . Obesity (BMI 30-39.9) 12/30/2012  . 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HCC) 08/21/2011  . Meningioma (HCC) 08/13/2011  . DIZZINESS 11/02/2009  . Type 2 diabetes mellitus, controlled (HCC) 05/20/2009  . HYPOKALEMIA 05/02/2009  . DEPRESSION 06/24/2008  . GERD 06/24/2008  . PEPTIC ULCER DISEASE 06/24/2008  . TMJ SYNDROME 06/09/2008  . Hyperlipemia 11/08/2006  . OBSTRUCTIVE SLEEP APNEA 11/08/2006  . Essential hypertension 11/08/2006  . ALLERGIC RHINITIS 11/08/2006  . VOCAL CORD DISORDER 11/08/2006  . ASTHMA 11/08/2006  . TACHYARRHYTHMIA 11/08/2006    Mayetta Castleman,CHRIS, PTA 01/12/2020, 5:47 PM  [facility] [address] [place], [place], [address] Phone: [phone]   Fax:  [phone]  Name: SHAQUESHA KARL MRN: [id number] Date of Birth: 13-Mar-1940

## 2020-01-13 ENCOUNTER — Ambulatory Visit: Payer: Medicare Other | Admitting: *Deleted

## 2020-01-13 ENCOUNTER — Other Ambulatory Visit: Payer: Self-pay

## 2020-01-13 DIAGNOSIS — M25652 Stiffness of left hip, not elsewhere classified: Secondary | ICD-10-CM

## 2020-01-13 DIAGNOSIS — M25552 Pain in left hip: Secondary | ICD-10-CM | POA: Diagnosis not present

## 2020-01-13 DIAGNOSIS — R262 Difficulty in walking, not elsewhere classified: Secondary | ICD-10-CM

## 2020-01-13 NOTE — Therapy (Signed)
[facility] [address] [place], [place], [address] Phone: [phone]   Fax:  [phone]  Physical Therapy Treatment  Patient Details  Name: Dawn Salas MRN: [id number] Date of Birth: 05-Jun-1940 Referring Provider (PT): Jodene Nam, PA-C   Encounter Date: 01/13/2020   PT End of Session - 01/13/20 1013    Visit Number 6    Number of Visits 8    Date for PT Re-Evaluation 02/01/20    Authorization Type [employer] Medicare (CQ modifier) FOTO 4th 24%; Progress note every 10th visit    PT Start Time 1001    PT Stop Time 1036    PT Time Calculation (min) 35 min           Past Medical History:  Diagnosis Date  . Allergy   . Anxiety   . Arthritis   . Asthma    patient denies   . Cataract   . Depression   . Diabetes mellitus    Type two  . Diverticulitis   . Family history of adverse reaction to anesthesia    daughter- N/V   . Frequency of urination   . GERD (gastroesophageal reflux disease)   . History of hiatal hernia   . Hyperglycemia   . Hyperlipidemia   . Hypertension   . Hyperthyroidism    PT HAD BIPOSY -NEGATIVE.Marland Kitchen AND NOW JUST GETS CHECKED EACH YEAR .Marland Kitchen 2013 LAST TEST   SEES DR. [name].   . 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 Obstructive sleep apnea    STUDY AT [facility] DOES NOT USE C PAP  . Palpitations   . Peptic ulcer disease   . Pneumonia    "walking pneumonia" hx of  . Seizures (HCC) 2015   one siezure with brain surgery none since 2015   . Sleep apnea    no CPAP    Past Surgical History:  Procedure Laterality Date  . ABDOMINAL HYSTERECTOMY    . BACK SURGERY     L SPIN 2003   NECK 2004  . BRAIN MENINGIOMA EXCISION  8/10   X 2  . C5-C6 neck fusion    . COLONOSCOPY  05/05/2009  . CRANIOTOMY  08/13/2011   Procedure: CRANIOTOMY TUMOR EXCISION;  Surgeon: Cristi Loron, MD;  Location: [facility];  Service: Neurosurgery;  Laterality: Bilateral;  Bifrontal craniotomy for  tumor  . DILATION AND CURETTAGE OF UTERUS     YEARS AGO  . L4-L5 posterior la    . left foot plantar and hammertoe    . right foot bunectomy    . right knee arthroscopy     x 3  . right shoulder rotator cuff    . ROBOTIC ASSISTED BILATERAL SALPINGO OOPHERECTOMY N/A 08/25/2015   Procedure: XI ROBOTIC ASSISTED BILATERAL SALPINGO OOPHORECTOMY;  Surgeon: Laurette Schimke, MD;  Location: [facility];  Service: Gynecology;  Laterality: N/A;  . TONSILLECTOMY    . TUBAL LIGATION    . WRIST SURGERY Right     There were no vitals filed for this visit.                      [facility] Adult PT Treatment/Exercise - 01/13/20 0001      Lumbar Exercises: Aerobic   Nustep L4 x16 min      Ultrasound   Ultrasound Location LT hip    Ultrasound Parameters [place] @ 1.5 w/cm2  x 10 mins    Ultrasound Goals Pain  Iontophoresis   Type of Iontophoresis Dexamethasone    Location left hip    Dose 1.0 / 5 of 6    Time 8                       PT Long Term Goals - 01/07/20 1040      PT LONG TERM GOAL #1   Title Patient will be independent with HEP and its progression    Baseline Met 01/07/20    Time 4    Period Weeks    Status Achieved      PT LONG TERM GOAL #2   Title Patient will report ability to perform ADLs and home activities with L hip pain less than or equal to 3/10    Baseline no pain the past few visits 01/07/20    Time 4    Period Weeks    Status Partially Met      PT LONG TERM GOAL #3   Title Patient will demonstrate 4+/5 or greater left hip MMT to improve stability during functional tasks.    Time 4    Period Weeks    Status On-going      PT LONG TERM GOAL #4   Title Patient will report ability to sleep on L side with hip pain less than or equal to 3/10.    Baseline less discomfort 01/07/20    Time 4    Period Weeks    Status On-going                 Plan - 01/13/20 1215    Clinical Impression Statement Pt arrived late today, but doing well.  She did well with ex f/b [place] and ionto with Dexa. Pt continues to improve with decreased pain.    Personal Factors and Comorbidities Age;Comorbidity 2    Comorbidities HTN, DM, history of lumbar surgery 07/02/2019, history of R knee surgery, history of brain surgery    Examination-Activity Limitations Bathing;Locomotion Level;Transfers;Sleep;Stairs;Stand;Dressing    Rehab Potential Good    PT Frequency 2x / week    PT Duration 4 weeks    PT Treatment/Interventions Iontophoresis 4mg /ml Dexamethasone;ADLs/Self Care Home Management;Moist Heat;Ultrasound;Cryotherapy;Electrical Stimulation;Therapeutic exercise;Balance training;Neuromuscular re-education;Manual techniques;Passive range of motion;Patient/family education;Gait training;Stair training;Functional mobility training;Therapeutic activities    PT Next Visit Plan cont with POC for hip strengthening and balance activities, iontophoresis and ITB stretching per referral, modalities PRN for pain relief    PT Home Exercise Plan see patient education section    Consulted and Agree with Plan of Care Patient           Patient will benefit from skilled therapeutic intervention in order to improve the following deficits and impairments:  Abnormal gait,Decreased range of motion,Difficulty walking,Pain,Decreased strength,Decreased balance,Decreased activity tolerance  Visit Diagnosis: Pain in left hip  Difficulty in walking, not elsewhere classified  Stiffness of left hip, not elsewhere classified     Problem List Patient Active Problem List   Diagnosis Date Noted  . 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  . CKD (chronic kidney disease) stage 3, GFR 30-59 ml/min (HCC) 10/01/2017  . History of normocytic normochromic anemia 11/25/2015  . 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  . 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  . Spondylolisthesis of lumbar region 12/23/2013  . Idiopathic guttate hypomelanosis  06/30/2013  . Hyperthyroidism 05/25/2013  . Obesity (BMI 30-39.9) 12/30/2012  . 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HCC) 08/21/2011  . Meningioma (HCC) 08/13/2011  . DIZZINESS 11/02/2009  . Type 2  diabetes mellitus, controlled (HCC) 05/20/2009  . HYPOKALEMIA 05/02/2009  . DEPRESSION 06/24/2008  . GERD 06/24/2008  . PEPTIC ULCER DISEASE 06/24/2008  . TMJ SYNDROME 06/09/2008  . Hyperlipemia 11/08/2006  . OBSTRUCTIVE SLEEP APNEA 11/08/2006  . Essential hypertension 11/08/2006  . ALLERGIC RHINITIS 11/08/2006  . VOCAL CORD DISORDER 11/08/2006  . ASTHMA 11/08/2006  . TACHYARRHYTHMIA 11/08/2006    Zehra Rucci,CHRIS, PTA 01/13/2020, 12:20 PM  [facility] [address] [place], [place], [address] Phone: [phone]   Fax:  [phone]  Name: ADVIKA FOXX MRN: [id number] Date of Birth: 12/23/1940

## 2020-01-18 ENCOUNTER — Ambulatory Visit: Payer: Medicare Other | Admitting: Physical Therapy

## 2020-01-18 ENCOUNTER — Other Ambulatory Visit: Payer: Self-pay

## 2020-01-18 DIAGNOSIS — R262 Difficulty in walking, not elsewhere classified: Secondary | ICD-10-CM

## 2020-01-18 DIAGNOSIS — M25552 Pain in left hip: Secondary | ICD-10-CM

## 2020-01-18 DIAGNOSIS — M25652 Stiffness of left hip, not elsewhere classified: Secondary | ICD-10-CM

## 2020-01-18 NOTE — Therapy (Signed)
[facility] [address] [place], [place], [address] Phone: [phone]   Fax:  [phone]  Physical Therapy Treatment  Patient Details  Name: Dawn Salas MRN: [id number] Date of Birth: 1940/03/25 Referring Provider (PT): Jodene Nam, PA-C   Encounter Date: 01/18/2020   PT End of Session - 01/18/20 1112    Visit Number 7    Number of Visits 8    Date for PT Re-Evaluation 02/01/20    Authorization Type [employer] Medicare (CQ modifier) FOTO 4th 24%; Progress note every 10th visit    PT Start Time 1030    PT Stop Time 1105    PT Time Calculation (min) 35 min    Activity Tolerance Patient tolerated treatment well    Behavior During Therapy [facility] for tasks assessed/performed           Past Medical History:  Diagnosis Date  . Allergy   . Anxiety   . Arthritis   . Asthma    patient denies   . Cataract   . Depression   . Diabetes mellitus    Type two  . Diverticulitis   . Family history of adverse reaction to anesthesia    daughter- N/V   . Frequency of urination   . GERD (gastroesophageal reflux disease)   . History of hiatal hernia   . Hyperglycemia   . Hyperlipidemia   . Hypertension   . Hyperthyroidism    PT HAD BIPOSY -NEGATIVE.Marland Kitchen AND NOW JUST GETS CHECKED EACH YEAR .Marland Kitchen 2013 LAST TEST   SEES DR. [name].   . 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 Obstructive sleep apnea    STUDY AT [facility] DOES NOT USE C PAP  . Palpitations   . Peptic ulcer disease   . Pneumonia    "walking pneumonia" hx of  . Seizures (HCC) 2015   one siezure with brain surgery none since 2015   . Sleep apnea    no CPAP    Past Surgical History:  Procedure Laterality Date  . ABDOMINAL HYSTERECTOMY    . BACK SURGERY     L SPIN 2003   NECK 2004  . BRAIN MENINGIOMA EXCISION  8/10   X 2  . C5-C6 neck fusion    . COLONOSCOPY  05/05/2009  . CRANIOTOMY  08/13/2011   Procedure: CRANIOTOMY TUMOR EXCISION;  Surgeon: Cristi Loron, MD;  Location: [facility];  Service: Neurosurgery;  Laterality: Bilateral;  Bifrontal craniotomy for tumor  . DILATION AND CURETTAGE OF UTERUS     YEARS AGO  . L4-L5 posterior la    . left foot plantar and hammertoe    . right foot bunectomy    . right knee arthroscopy     x 3  . right shoulder rotator cuff    . ROBOTIC ASSISTED BILATERAL SALPINGO OOPHERECTOMY N/A 08/25/2015   Procedure: XI ROBOTIC ASSISTED BILATERAL SALPINGO OOPHORECTOMY;  Surgeon: Laurette Schimke, MD;  Location: [facility];  Service: Gynecology;  Laterality: N/A;  . TONSILLECTOMY    . TUBAL LIGATION    . WRIST SURGERY Right     There were no vitals filed for this visit.   Subjective Assessment - 01/18/20 1033    Subjective COVID-19 screening performed upon arrival. Patient reported no pain and doing well today. some knee pain so no nustep today.    Pertinent History HTN, DM, history of lumbar surgery 07/02/2019, history of brain surgery    Limitations Standing;Walking;House hold activities  How long can you sit comfortably? "no trouble"    How long can you stand comfortably? short periods on/off    How long can you walk comfortably? "30 mins"    Diagnostic tests x-ray: "bursitis"    Patient Stated Goals decrease pain, less pain with sleeping.    Currently in Pain? No/denies                             [facility] Adult PT Treatment/Exercise - 01/18/20 0001      Knee/Hip Exercises: Supine   Straight Leg Raises Strengthening;Left;20 reps      Knee/Hip Exercises: Sidelying   Hip ABduction Strengthening;Left;20 reps    Clams x20 left LE      Ultrasound   Ultrasound Location Lt hip    Ultrasound Parameters [place] @ 1.5w/cm2/50%/72mhz x47min    Ultrasound Goals Pain      Iontophoresis   Type of Iontophoresis Dexamethasone    Location left hip    Dose 1.0 / 6 of 6    Time 8      Manual Therapy   Manual Therapy Soft tissue mobilization    Manual therapy comments manual STW to left hip to  reduce pain and tone                       PT Long Term Goals - 01/07/20 1040      PT LONG TERM GOAL #1   Title Patient will be independent with HEP and its progression    Baseline Met 01/07/20    Time 4    Period Weeks    Status Achieved      PT LONG TERM GOAL #2   Title Patient will report ability to perform ADLs and home activities with L hip pain less than or equal to 3/10    Baseline no pain the past few visits 01/07/20    Time 4    Period Weeks    Status Partially Met      PT LONG TERM GOAL #3   Title Patient will demonstrate 4+/5 or greater left hip MMT to improve stability during functional tasks.    Time 4    Period Weeks    Status On-going      PT LONG TERM GOAL #4   Title Patient will report ability to sleep on L side with hip pain less than or equal to 3/10.    Baseline less discomfort 01/07/20    Time 4    Period Weeks    Status On-going                 Plan - 01/18/20 1116    Clinical Impression Statement Patient tolerated treatment well today. No pain in hip just some new knee discomfort so no nustep today. Patient has some ongoing palpable discomfort yet none otherwise per reported. Today issued last ionto patch. Patient doing well with HEP and able to perform all ADL's with greater ease. Patient has one visit remaining and to DC.    Personal Factors and Comorbidities Age;Comorbidity 2    Comorbidities HTN, DM, history of lumbar surgery 07/02/2019, history of R knee surgery, history of brain surgery    Examination-Activity Limitations Bathing;Locomotion Level;Transfers;Sleep;Stairs;Stand;Dressing    Stability/Clinical Decision Making Stable/Uncomplicated    Rehab Potential Good    PT Frequency 2x / week    PT Duration 4 weeks    PT Treatment/Interventions Iontophoresis 4mg /ml Dexamethasone;ADLs/Self  Care Home Management;Moist Heat;Ultrasound;Cryotherapy;Electrical Stimulation;Therapeutic exercise;Balance training;Neuromuscular  re-education;Manual techniques;Passive range of motion;Patient/family education;Gait training;Stair training;Functional mobility training;Therapeutic activities    PT Next Visit Plan Next treatment FOTO/asess goals and DC    Consulted and Agree with Plan of Care Patient           Patient will benefit from skilled therapeutic intervention in order to improve the following deficits and impairments:  Abnormal gait,Decreased range of motion,Difficulty walking,Pain,Decreased strength,Decreased balance,Decreased activity tolerance  Visit Diagnosis: Pain in left hip  Difficulty in walking, not elsewhere classified  Stiffness of left hip, not elsewhere classified     Problem List Patient Active Problem List   Diagnosis Date Noted  . 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  . CKD (chronic kidney disease) stage 3, GFR 30-59 ml/min (HCC) 10/01/2017  . History of normocytic normochromic anemia 11/25/2015  . 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  . 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  . Spondylolisthesis of lumbar region 12/23/2013  . 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 Hyperthyroidism 05/25/2013  . Obesity (BMI 30-39.9) 12/30/2012  . 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HCC) 08/21/2011  . Meningioma (HCC) 08/13/2011  . DIZZINESS 11/02/2009  . Type 2 diabetes mellitus, controlled (HCC) 05/20/2009  . HYPOKALEMIA 05/02/2009  . DEPRESSION 06/24/2008  . GERD 06/24/2008  . PEPTIC ULCER DISEASE 06/24/2008  . TMJ SYNDROME 06/09/2008  . Hyperlipemia 11/08/2006  . OBSTRUCTIVE SLEEP APNEA 11/08/2006  . Essential hypertension 11/08/2006  . ALLERGIC RHINITIS 11/08/2006  . VOCAL CORD DISORDER 11/08/2006  . ASTHMA 11/08/2006  . TACHYARRHYTHMIA 11/08/2006    Barbarajean Kinzler P, PTA 01/18/2020, 11:22 AM  [facility] [address] [place], [place], [address] Phone: [phone]   Fax:  [phone]  Name:  Dawn Salas MRN: [id number] Date of Birth: 28-Jun-1940

## 2020-01-19 ENCOUNTER — Other Ambulatory Visit: Payer: Self-pay

## 2020-01-19 ENCOUNTER — Ambulatory Visit: Payer: Medicare Other | Admitting: *Deleted

## 2020-01-19 DIAGNOSIS — M25652 Stiffness of left hip, not elsewhere classified: Secondary | ICD-10-CM

## 2020-01-19 DIAGNOSIS — M25552 Pain in left hip: Secondary | ICD-10-CM | POA: Diagnosis not present

## 2020-01-19 DIAGNOSIS — R262 Difficulty in walking, not elsewhere classified: Secondary | ICD-10-CM

## 2020-01-19 NOTE — Therapy (Signed)
[facility] [address] [place], [place], [address] Phone: [phone]   Fax:  [phone]  Physical Therapy Treatment PHYSICAL THERAPY DISCHARGE SUMMARY  Visits from Start of Care: 8  Current functional level related to goals / functional outcomes: See below   Remaining deficits: See goals   Education / Equipment: HEP Plan: Patient agrees to discharge.  Patient goals were met. Patient is being discharged due to meeting the stated rehab goals.  ?????  Guss Bunde, PT, DPT 01/20/20   Patient Details  Name: Dawn Salas MRN: [id number] Date of Birth: 03/02/1940 Referring Provider (PT): Jodene Nam, PA-C   Encounter Date: 01/19/2020   PT End of Session - 01/19/20 1044    Visit Number 8    Number of Visits 8    Date for PT Re-Evaluation 02/01/20    Authorization Type [employer] Medicare (CQ modifier) FOTO 4th 24%; Progress note every 10th visit    PT Start Time 1030    PT Stop Time 1105    PT Time Calculation (min) 35 min           Past Medical History:  Diagnosis Date  . Allergy   . Anxiety   . Arthritis   . Asthma    patient denies   . Cataract   . Depression   . Diabetes mellitus    Type two  . Diverticulitis   . Family history of adverse reaction to anesthesia    daughter- N/V   . Frequency of urination   . GERD (gastroesophageal reflux disease)   . History of hiatal hernia   . Hyperglycemia   . Hyperlipidemia   . Hypertension   . Hyperthyroidism    PT HAD BIPOSY -NEGATIVE.Marland Kitchen AND NOW JUST GETS CHECKED EACH YEAR .Marland Kitchen 2013 LAST TEST   SEES DR. [name].   . 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 Obstructive sleep apnea    [facility]Y [facility] DOES NOT USE C PAP  . Palpitations   . Peptic ulcer disease   . Pneumonia    "walking pneumonia" hx of  . Seizures (HCC) 2015   one siezure with brain surgery none since 2015   . Sleep apnea    no CPAP    Past Surgical History:  Procedure  Laterality Date  . ABDOMINAL HYSTERECTOMY    . BACK SURGERY     L SPIN 2003   NECK 2004  . BRAIN MENINGIOMA EXCISION  8/10   X 2  . C5-C6 neck fusion    . COLONOSCOPY  05/05/2009  . CRANIOTOMY  08/13/2011   Procedure: CRANIOTOMY TUMOR EXCISION;  Surgeon: Cristi Loron, MD;  Location: [facility];  Service: Neurosurgery;  Laterality: Bilateral;  Bifrontal craniotomy for tumor  . DILATION AND CURETTAGE OF UTERUS     YEARS AGO  . L4-L5 posterior la    . left foot plantar and hammertoe    . right foot bunectomy    . right knee arthroscopy     x 3  . right shoulder rotator cuff    . ROBOTIC ASSISTED BILATERAL SALPINGO OOPHERECTOMY N/A 08/25/2015   Procedure: XI ROBOTIC ASSISTED BILATERAL SALPINGO OOPHORECTOMY;  Surgeon: Laurette Schimke, MD;  Location: [facility];  Service: Gynecology;  Laterality: N/A;  . TONSILLECTOMY    . TUBAL LIGATION    . WRIST SURGERY Right     There were no vitals filed for this visit.   Subjective Assessment - 01/19/20 1041  Subjective COVID-19 screening performed upon arrival. Patient reported no pain and doing well today. some knee pain so no nustep today again. LT hip soreness is all    Pertinent History HTN, DM, history of lumbar surgery 07/02/2019, history of brain surgery    Limitations Standing;Walking;House hold activities    How long can you sit comfortably? "no trouble"    How long can you stand comfortably? short periods on/off    How long can you walk comfortably? "30 mins"    Diagnostic tests x-ray: "bursitis"    Patient Stated Goals decrease pain, less pain with sleeping.    Currently in Pain? No/denies    Pain Score 1     Pain Location Hip    Pain Orientation Left    Pain Descriptors / Indicators Sore    Pain Type Acute pain    Pain Onset More than a month ago                             [facility] Adult PT Treatment/Exercise - 01/19/20 0001      Knee/Hip Exercises: Supine   Straight Leg Raises Strengthening;Left;20 reps       Knee/Hip Exercises: Sidelying   Hip ABduction Strengthening;Left;20 reps    Clams x20 left LE      Ultrasound   Ultrasound Location LT hip    Ultrasound Parameters 1.5 w/cm2 x 10 mins    Ultrasound Goals Pain      Manual Therapy   Manual Therapy Soft tissue mobilization    Manual therapy comments manual STW to left hip musculature to reduce pain and tone                       PT Long Term Goals - 01/19/20 1043      PT LONG TERM GOAL #1   Title Patient will be independent with HEP and its progression    Baseline Met 01/07/20    Time 4    Period Weeks    Status Achieved      PT LONG TERM GOAL #2   Title Patient will report ability to perform ADLs and home activities with L hip pain less than or equal to 3/10    Baseline no pain the past few visits 01/07/20    Time 4    Period Weeks    Status Achieved      PT LONG TERM GOAL #3   Title Patient will demonstrate 4+/5 or greater left hip MMT to improve stability during functional tasks.    Time 4    Period Weeks    Status Achieved      PT LONG TERM GOAL #4   Baseline less discomfort 01/07/20    Period Weeks    Status Achieved             FOTO 33% Limitation    Plan - 01/19/20 1752    Clinical Impression Statement Pt arrived today doing fairly well with mainly tenderness in hip. HEP reviewed f/b [place] and STW to LT hip. Pt has progressed well and has met all LTGs and will  be D/C to HEP    Personal Factors and Comorbidities Age;Comorbidity 2    Comorbidities HTN, DM, history of lumbar surgery 07/02/2019, history of R knee surgery, history of brain surgery    Examination-Activity Limitations Bathing;Locomotion Level;Transfers;Sleep;Stairs;Stand;Dressing    Rehab Potential Good    PT Frequency 2x / week  PT Duration 4 weeks    PT Treatment/Interventions Iontophoresis 4mg /ml Dexamethasone;ADLs/Self Care Home Management;Moist Heat;Ultrasound;Cryotherapy;Electrical Stimulation;Therapeutic  exercise;Balance training;Neuromuscular re-education;Manual techniques;Passive range of motion;Patient/family education;Gait training;Stair training;Functional mobility training;Therapeutic activities    PT Next Visit Plan DC to HEP    Consulted and Agree with Plan of Care Patient           Patient will benefit from skilled therapeutic intervention in order to improve the following deficits and impairments:  Abnormal gait,Decreased range of motion,Difficulty walking,Pain,Decreased strength,Decreased balance,Decreased activity tolerance  Visit Diagnosis: Pain in left hip  Difficulty in walking, not elsewhere classified  Stiffness of left hip, not elsewhere classified     Problem List Patient Active Problem List   Diagnosis Date Noted  . 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  . CKD (chronic kidney disease) stage 3, GFR 30-59 ml/min (HCC) 10/01/2017  . History of normocytic normochromic anemia 11/25/2015  . 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  . 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  . Spondylolisthesis of lumbar region 12/23/2013  . 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 Hyperthyroidism 05/25/2013  . Obesity (BMI 30-39.9) 12/30/2012  . 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HCC) 08/21/2011  . Meningioma (HCC) 08/13/2011  . DIZZINESS 11/02/2009  . Type 2 diabetes mellitus, controlled (HCC) 05/20/2009  . HYPOKALEMIA 05/02/2009  . DEPRESSION 06/24/2008  . GERD 06/24/2008  . PEPTIC ULCER DISEASE 06/24/2008  . TMJ SYNDROME 06/09/2008  . Hyperlipemia 11/08/2006  . OBSTRUCTIVE SLEEP APNEA 11/08/2006  . Essential hypertension 11/08/2006  . ALLERGIC RHINITIS 11/08/2006  . VOCAL CORD DISORDER 11/08/2006  . ASTHMA 11/08/2006  . TACHYARRHYTHMIA 11/08/2006    Alonso Gapinski,CHRIS, PTA 01/19/2020, 6:00 PM  [facility] [address] [place], [place], [address] Phone: [phone]   Fax:   [phone]  Name: Dawn Salas MRN: [id number] Date of Birth: November 05, 1940

## 2020-02-02 ENCOUNTER — Encounter: Payer: Self-pay | Admitting: Endocrinology

## 2020-02-02 ENCOUNTER — Other Ambulatory Visit: Payer: Self-pay

## 2020-02-02 ENCOUNTER — Ambulatory Visit (INDEPENDENT_AMBULATORY_CARE_PROVIDER_SITE_OTHER): Payer: Medicare Other | Admitting: Endocrinology

## 2020-02-02 VITALS — BP 120/72 | HR 64 | Ht 64.0 in | Wt 225.2 lb

## 2020-02-02 DIAGNOSIS — E890 Postprocedural hypothyroidism: Secondary | ICD-10-CM

## 2020-02-02 DIAGNOSIS — E1169 Type 2 diabetes mellitus with other specified complication: Secondary | ICD-10-CM | POA: Diagnosis not present

## 2020-02-02 DIAGNOSIS — E669 Obesity, unspecified: Secondary | ICD-10-CM | POA: Diagnosis not present

## 2020-02-02 DIAGNOSIS — Z862 Personal history of diseases of the blood and blood-forming organs and certain disorders involving the immune mechanism: Secondary | ICD-10-CM | POA: Diagnosis not present

## 2020-02-02 DIAGNOSIS — E7800 Pure hypercholesterolemia, unspecified: Secondary | ICD-10-CM | POA: Diagnosis not present

## 2020-02-02 LAB — LIPID PANEL
Cholesterol: 174 mg/dL (ref 0–200)
HDL: 83.1 mg/dL (ref >39.00)
LDL Cholesterol: 76 mg/dL (ref 0–99)
NonHDL: 91.04
Total CHOL/HDL Ratio: 2
Triglycerides: 73 mg/dL (ref 0.0–149.0)
VLDL: 14.6 mg/dL (ref 0.0–40.0)

## 2020-02-02 LAB — CBC WITH DIFFERENTIAL/PLATELET
Basophils Absolute: 0.1 10*3/uL (ref 0.0–0.1)
Basophils Relative: 1.2 % (ref 0.0–3.0)
Eosinophils Absolute: 0.1 10*3/uL (ref 0.0–0.7)
Eosinophils Relative: 2.1 % (ref 0.0–5.0)
HCT: 37.9 % (ref 36.0–46.0)
Hemoglobin: 12.3 g/dL (ref 12.0–15.0)
Lymphocytes Relative: 27.1 % (ref 12.0–46.0)
Lymphs Abs: 1.2 10*3/uL (ref 0.7–4.0)
MCHC: 32.4 g/dL (ref 30.0–36.0)
MCV: 79.6 fl (ref 78.0–100.0)
Monocytes Absolute: 0.3 10*3/uL (ref 0.1–1.0)
Monocytes Relative: 6.9 % (ref 3.0–12.0)
Neutro Abs: 2.9 10*3/uL (ref 1.4–7.7)
Neutrophils Relative %: 62.7 % (ref 43.0–77.0)
Platelets: 159 10*3/uL (ref 150.0–400.0)
RBC: 4.76 Mil/uL (ref 3.87–5.11)
RDW: 16.2 % — ABNORMAL HIGH (ref 11.5–15.5)
WBC: 4.6 10*3/uL (ref 4.0–10.5)

## 2020-02-02 LAB — TSH: TSH: 3.76 u[IU]/mL (ref 0.35–4.50)

## 2020-02-02 LAB — POCT GLYCOSYLATED HEMOGLOBIN (HGB A1C): Hemoglobin A1C: 6 % — AB (ref 4.0–5.6)

## 2020-02-02 LAB — COMPREHENSIVE METABOLIC PANEL
ALT: 11 U/L (ref 0–35)
AST: 21 U/L (ref 0–37)
Albumin: 4.4 g/dL (ref 3.5–5.2)
Alkaline Phosphatase: 83 U/L (ref 39–117)
BUN: 20 mg/dL (ref 6–23)
CO2: 30 mEq/L (ref 19–32)
Calcium: 9.1 mg/dL (ref 8.4–10.5)
Chloride: 103 mEq/L (ref 96–112)
Creatinine, Ser: 1.34 mg/dL — ABNORMAL HIGH (ref 0.40–1.20)
GFR: 38.33 mL/min — ABNORMAL LOW (ref >60.00)
Glucose, Bld: 108 mg/dL — ABNORMAL HIGH (ref 70–99)
Potassium: 4.2 mEq/L (ref 3.5–5.1)
Sodium: 138 mEq/L (ref 135–145)
Total Bilirubin: 0.8 mg/dL (ref 0.2–1.2)
Total Protein: 6.7 g/dL (ref 6.0–8.3)

## 2020-02-02 LAB — T4, FREE: Free T4: 1.03 ng/dL (ref 0.60–1.60)

## 2020-02-02 LAB — MICROALBUMIN / CREATININE URINE RATIO
Creatinine,U: 95.3 mg/dL
Microalb Creat Ratio: 0.7 mg/g (ref 0.0–30.0)
Microalb, Ur: <0.7 mg/dL (ref 0.0–1.9)

## 2020-02-02 NOTE — Progress Notes (Signed)
Patient ID: Dawn Salas, female   DOB: 1940-07-14, 80 y.o.   MRN: [id number]                                                                                                                  Chief complaint: Endocrinology follow-up   History of Present Illness:   History obtained on initial consultation:  She was diagnosed as having Hyperthyroidism in 2015 when she was having palpitations and was seen to have a goiter. At that time she was having the usual symptoms of fatigue, shakiness, palpitations, heat intolerance and some weight loss No details of this are available She was treated with I-131, unknown dose which was prescribed in March 2015 Apparently she had a large hot nodule on the left, however details of her treatment are not available  Apparently subsequently her hyperthyroidism recurred and methimazole restarted ?  In 4/17 Also the dose was increased up to 7.5 mg in 10/2015  Apparently prior to her being treated for the hot nodule she had a multinodular goiter evaluated with a fine-needle aspiration in 09/2010  Since she was significantly hyperthyroid on her initial exam in February and free T3 was 5.0 and was treated with methimazole Because of persistent hyperthyroidism and very high thyrotropin receptor antibody she was given I-131 treatment with 20.3 mCi on 06/04/16  RECENT history:  She has been HYPOTHYROID as of 07/2016 with a low free T4 level  She was also having some fatigue and cold sensitivity as well as the muscle cramps With starting levothyroxine 125 g she felt better, however subsequently TSH was low again the dose was reduced aggressively  She is taking 75g of levothyroxine daily since 6/20  She feels fairly good with no unusual fatigue Sometimes will feel warm or sweaty and this is followed by a feeling of coldness She has taken her levothyroxine before breakfast daily and has not missed any doses   She takes her Geritol with iron after breakfast  which is at least 30 minutes later  Wt Readings from Last 3 Encounters:  02/02/20 225 lb 3.2 oz (102.2 kg)  11/02/19 220 lb 4.8 oz (99.9 kg)  07/29/19 218 lb (98.9 kg)     Thyroid function tests as follows:     Lab Results  Component Value Date   TSH 1.93 07/29/2019   TSH 1.06 01/26/2019   TSH 0.37 09/16/2018   FREET4 1.11 07/29/2019   FREET4 1.27 01/26/2019   FREET4 1.23 09/16/2018   Ophthalmopathy:  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 with wearing prism lenses Has some tearing of the eyes and has been given drops by the ophthalmologist  No recent swelling of the eyes, followed by ophthalmologist regularly Previous MRI had shown mild thickening of the rectus muscle on the left in 2018  DIABETES: See review of systems    Allergies as of 02/02/2020      Reactions   Flagyl [metronidazole] Other (See Comments)   Caused increased heart rate   Aspirin Effervescent Swelling  Alka-Seltzer gel caps- sore mouth, face swollen, turned hands white   Morphine Sulfate Hives   Hallucinations   Penicillins Swelling, Rash   Has patient had a PCN reaction causing immediate rash, facial/tongue/throat swelling, SOB or lightheadedness with hypotension: Yes Has patient had a PCN reaction causing severe rash involving mucus membranes or skin necrosis: No Has patient had a PCN reaction that required hospitalization No Has patient had a PCN reaction occurring within the last 10 years: No If all of the above answers are "NO", then may proceed with Cephalosporin use.      Medication List       Accurate as of February 02, 2020 11:29 AM. If you have any questions, ask your nurse or doctor.        STOP taking these medications   tiZANidine 4 MG tablet Commonly known as: ZANAFLEX Stopped by: Elayne Snare, MD     TAKE these medications   Accu-Chek Aviva Plus test strip Generic drug: glucose blood TEST BLOOD SUGAR TWICE DAILY.   accu-chek softclix lancets 1 each by Other  route 2 (two) times daily. E11.9   Accu-Chek Softclix Lancets lancets TEST BLOOD SUGAR TWICE DAILY.   acetaminophen 650 MG CR tablet Commonly known as: TYLENOL Take 1,300 mg by mouth every 8 (eight) hours as needed for pain.   amLODipine 5 MG tablet Commonly known as: NORVASC TAKE ONE (1) TABLET EACH DAY   ascorbic acid 500 MG tablet Commonly known as: VITAMIN C Take 500 mg by mouth daily.   betamethasone dipropionate 0.05 % cream APPLY TO LEGS AS DIRECTED   cetirizine 10 MG tablet Commonly known as: ZYRTEC Take 10 mg by mouth daily as needed for allergies.   furosemide 40 MG tablet Commonly known as: LASIX Take 0.5 tablets (20 mg total) by mouth daily as needed. For fluid   GERITOL COMPLETE PO Take 1 tablet by mouth daily.   Janumet XR [phone] MG Tb24 Generic drug: SitaGLIPtin-MetFORMIN HCl TAKE 1 TABLET DAILY AT LUNCH   levothyroxine 75 MCG tablet Commonly known as: SYNTHROID TAKE ONE (1) TABLET EACH DAY   losartan-hydrochlorothiazide 100-12.5 MG tablet Commonly known as: HYZAAR Take 1 tablet by mouth daily. What changed: how much to take   Magnesium 250 MG Tabs Take 250 mg by mouth daily.   meclizine 25 MG tablet Commonly known as: ANTIVERT Take 25 mg by mouth 3 (three) times daily as needed for dizziness.   metoprolol succinate 100 MG 24 hr tablet Commonly known as: TOPROL-XL TAKE ONE (1) TABLET EACH DAY   OVER THE COUNTER MEDICATION Apply 1 application topically daily as needed (pain). Oxyrub otc pain relieving gel   potassium chloride 10 MEQ tablet Commonly known as: KLOR-CON TAKE ONE (1) TABLET EACH DAY   RABEprazole 20 MG tablet Commonly known as: ACIPHEX TAKE ONE (1) TABLET EACH DAY   REFRESH LIQUIGEL OP Place 1 drop into both eyes at bedtime.   rosuvastatin 40 MG tablet Commonly known as: CRESTOR TAKE ONE (1) TABLET EACH DAY   solifenacin 5 MG tablet Commonly known as: VESICARE TAKE ONE (1) TABLET EACH DAY What changed: See the  new instructions.   Systane Balance 0.6 % Soln Generic drug: Propylene Glycol Place 1 drop into both eyes daily.   valACYclovir 1000 MG tablet Commonly known as: VALTREX TAKE 2 TABS TWICE DAILY FOR 1 DAY THEN AS NEEDED   VITAMIN B12 PO Take 1 mL by mouth daily. OTC  Past Medical History:  Diagnosis Date  . Allergy   . Anxiety   . Arthritis   . Asthma    patient denies   . Cataract   . Depression   . Diabetes mellitus    Type two  . Diverticulitis   . Family history of adverse reaction to anesthesia    daughter- N/V   . Frequency of urination   . GERD (gastroesophageal reflux disease)   . History of hiatal hernia   . Hyperglycemia   . Hyperlipidemia   . Hypertension   . Hyperthyroidism    PT HAD BIPOSY -NEGATIVE.Marland Kitchen AND NOW JUST GETS CHECKED EACH YEAR .Marland Kitchen 2013 LAST TEST   SEES DR. [name].   . Multiple meningiomas of spine and brain ([place])   . Obstructive sleep apnea    STUDY AT [facility] DOES NOT USE C PAP  . Palpitations   . Peptic ulcer disease   . Pneumonia    "walking pneumonia" hx of  . Seizures ([place]) 2015   one siezure with brain surgery none since 2015   . Sleep apnea    no CPAP    Past Surgical History:  Procedure Laterality Date  . ABDOMINAL HYSTERECTOMY    . BACK SURGERY     L SPIN 2003   NECK 2004  . BRAIN MENINGIOMA EXCISION  8/10   X 2  . C5-C6 neck fusion    . COLONOSCOPY  05/05/2009  . CRANIOTOMY  08/13/2011   Procedure: CRANIOTOMY TUMOR EXCISION;  Surgeon: [name], MD;  Location: [facility];  Service: Neurosurgery;  Laterality: Bilateral;  Bifrontal craniotomy for tumor  . DILATION AND CURETTAGE OF UTERUS     YEARS AGO  . L4-L5 posterior la    . left foot plantar and hammertoe    . right foot bunectomy    . right knee arthroscopy     x 3  . right shoulder rotator cuff    . ROBOTIC ASSISTED BILATERAL SALPINGO OOPHERECTOMY N/A 08/25/2015   Procedure: XI ROBOTIC ASSISTED BILATERAL SALPINGO OOPHORECTOMY;  Surgeon: [name], MD;  Location: [facility];  Service: Gynecology;  Laterality: N/A;  . TONSILLECTOMY    . TUBAL LIGATION    . WRIST SURGERY Right     Family History  Problem Relation Age of Onset  . Cancer Mother        ovarian  . Ovarian cancer Mother   . Heart disease Father 85  . Arthritis Other   . Hyperlipidemia Other   . Hypertension Other   . Diabetes Other   . Anesthesia problems Daughter        NAUSEA AND VOMITING POST OP  . Breast cancer Neg Hx   . Colon cancer Neg Hx   . Colon polyps Neg Hx   . Esophageal cancer Neg Hx   . Stomach cancer Neg Hx   . Rectal cancer Neg Hx     Social History:  reports that she has never smoked. She has never used smokeless tobacco. She reports that she does not drink alcohol and does not use drugs.  Allergies:  Allergies  Allergen Reactions  . Flagyl [Metronidazole] Other (See Comments)    Caused increased heart rate  . Aspirin Effervescent Swelling    Alka-Seltzer gel caps- sore mouth, face swollen, turned hands white  . Morphine Sulfate Hives    Hallucinations  . Penicillins Swelling and Rash    Has patient had a PCN reaction causing immediate rash, facial/tongue/throat swelling,  SOB or lightheadedness with hypotension: Yes Has patient had a PCN reaction causing severe rash involving mucus membranes or skin necrosis: No Has patient had a PCN reaction that required hospitalization No Has patient had a PCN reaction occurring within the last 10 years: No If all of the above answers are "NO", then may proceed with Cephalosporin use.       Review of Systems    DIABETES   She has had longstanding diabetes  A1c has been higher than 2021 Since she had relatively higher A1c and blood sugars she was switched from metformin to Janumet in 6/21  With this her blood sugars appear to be better although checking very infrequently Does not feel shaky as before also She has difficulty losing weight In the last month she said that she has been  excessively hungry and gaining weight  PRE-MEAL Fasting Lunch Dinner Bedtime Overall  Glucose range:  117-133   113    Mean/median:      135   POST-MEAL PC Breakfast PC Lunch PC Dinner  Glucose range:  194  136   Mean/median:      Wt Readings from Last 3 Encounters:  02/02/20 225 lb 3.2 oz (102.2 kg)  11/02/19 220 lb 4.8 oz (99.9 kg)  07/29/19 218 lb (98.9 kg)      Lab Results  Component Value Date   HGBA1C 6.0 (A) 02/02/2020   HGBA1C 6.6 (A) 11/02/2019   HGBA1C 7.0 (H) 06/29/2019   Lab Results  Component Value Date   MICROALBUR <0.7 01/17/2018   LDLCALC 96 01/26/2019   CREATININE 1.11 (H) 07/03/2019   HYPERTENSION: Followed by PCP with good control    BP Readings from Last 3 Encounters:  02/02/20 120/72  11/02/19 112/84  07/29/19 120/68       Examination:   BP 120/72   Pulse 64   Ht 5\' 4"  (1.626 m)   Wt 225 lb 3.2 oz (102.2 kg)   SpO2 97%   BMI 38.66 kg/m   No proptosis or swelling of the eyelids  No tremor Biceps reflexes appear normal   Assessment/Plan:  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-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  Her thyroid supplement has been stable at 75 mcg levothyroxine daily She is not complaining of any unusual fatigue Her symptoms of hot flashes and sweating is likely menopausal related  Labs to be checked today  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 and doing well now  DIABETES with obesity:  She is doing much better with Janumet compared to metformin A1c is excellent and her blood sugars are relatively good at home although still relatively high fasting Encouraged her to check her blood sugars more often after meals and work on her diet Likely needs to have treatment for her anxiety since recently she has had increased appetite from this  She did have anemia after her surgery last year and needs follow-up  Also needs follow-up lipids which will be drawn since she has an appointment with PCP  tomorrow  Follow-up to be decided     There are no Patient Instructions on file for this visit.  Elayne Snare 02/02/2020, 11:29 AM

## 2020-02-03 ENCOUNTER — Encounter: Payer: Self-pay | Admitting: Family Medicine

## 2020-02-03 ENCOUNTER — Ambulatory Visit (INDEPENDENT_AMBULATORY_CARE_PROVIDER_SITE_OTHER): Payer: Medicare Other | Admitting: Family Medicine

## 2020-02-03 VITALS — BP 122/74 | Temp 98.1°F | Ht 64.0 in | Wt 223.8 lb

## 2020-02-03 DIAGNOSIS — I10 Essential (primary) hypertension: Secondary | ICD-10-CM

## 2020-02-03 DIAGNOSIS — N1832 Chronic kidney disease, stage 3b: Secondary | ICD-10-CM

## 2020-02-03 DIAGNOSIS — R0789 Other chest pain: Secondary | ICD-10-CM

## 2020-02-03 DIAGNOSIS — E1122 Type 2 diabetes mellitus with diabetic chronic kidney disease: Secondary | ICD-10-CM | POA: Diagnosis not present

## 2020-02-03 DIAGNOSIS — N181 Chronic kidney disease, stage 1: Secondary | ICD-10-CM

## 2020-02-03 LAB — URINALYSIS, ROUTINE W REFLEX MICROSCOPIC
Bilirubin Urine: NEGATIVE
Hgb urine dipstick: NEGATIVE
Ketones, ur: NEGATIVE
Leukocytes,Ua: NEGATIVE
Nitrite: NEGATIVE
RBC / HPF: NONE SEEN (ref 0–?)
Specific Gravity, Urine: 1.01 (ref 1.000–1.030)
Total Protein, Urine: NEGATIVE
Urine Glucose: NEGATIVE
Urobilinogen, UA: 0.2 (ref 0.0–1.0)
pH: 6 (ref 5.0–8.0)

## 2020-02-03 NOTE — Progress Notes (Signed)
Please call to let patient know that the lab results are normal and no change needed. To discuss slightly high kidney test with PCP

## 2020-02-03 NOTE — Progress Notes (Signed)
Established Patient Office Visit  Subjective:  Patient ID: Dawn Salas, female    DOB: [birth year]  Age: 80 y.o. MRN: [id number]  CC:  Chief Complaint  Patient presents with  . Follow-up    3 month follow up patient would like to discuss labs that she had at endocrinologist office yesterday. Concerns about left side pains when laying down.     HPI [name] presents for concern regarding some labs she had done at endocrinologist yesterday.  Fortunately, her A1c remains well controlled at 6.0%.  Urine microalbumin screen was negative.  Her GFR had declined to 38.3 from her usual baseline around 50.  She does relate that her urine was fairly dark yesterday morning and she thinks she did not have a lot of fluids.  We explained that this can fluctuate day-to-day considerably based on hydration status.  Her diabetes been well controlled and she has hypertension which is also well controlled.  She does not take any nonsteroidals.  She had anemia over the summer and this was following back surgery for fusion.  She had follow-up hemoglobin yesterday which was normal range.  Her lipids are well controlled.  Her thyroid functions were normal.  She had some recent left side pain.  This is worse with position change such as rolling over.  Denies any injury.  No real abdominal pain.  No burning with urination.  No fevers or chills.  No appetite or weight changes.  Pain is relatively mild and very transient and with position change.  Colonoscopy up-to-date.  Past Medical History:  Diagnosis Date  . Allergy   . Anxiety   . Arthritis   . Asthma    patient denies   . Cataract   . Depression   . Diabetes mellitus    Type two  . Diverticulitis   . Family history of adverse reaction to anesthesia    daughter- N/V   . Frequency of urination   . GERD (gastroesophageal reflux disease)   . History of hiatal hernia   . Hyperglycemia   . Hyperlipidemia   . Hypertension   . Hyperthyroidism     PT HAD BIPOSY -NEGATIVE.Marland Kitchen AND NOW JUST GETS CHECKED EACH YEAR .Marland Kitchen 2013 LAST TEST   SEES DR. [name].   . 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 Obstructive sleep apnea    STUDY AT [facility] DOES NOT USE C PAP  . Palpitations   . Peptic ulcer disease   . Pneumonia    "walking pneumonia" hx of  . Seizures ([place]) 2015   one siezure with brain surgery none since 2015   . Sleep apnea    no CPAP    Past Surgical History:  Procedure Laterality Date  . ABDOMINAL HYSTERECTOMY    . BACK SURGERY     L SPIN 2003   NECK 2004  . BRAIN MENINGIOMA EXCISION  8/10   X 2  . C5-C6 neck fusion    . COLONOSCOPY  05/05/2009  . CRANIOTOMY  08/13/2011   Procedure: CRANIOTOMY TUMOR EXCISION;  Surgeon: [name], MD;  Location: [facility];  Service: Neurosurgery;  Laterality: Bilateral;  Bifrontal craniotomy for tumor  . DILATION AND CURETTAGE OF UTERUS     YEARS AGO  . L4-L5 posterior la    . left foot plantar and hammertoe    . right foot bunectomy    . right knee arthroscopy     x 3  . right  shoulder rotator cuff    . ROBOTIC ASSISTED BILATERAL SALPINGO OOPHERECTOMY N/A 08/25/2015   Procedure: XI ROBOTIC ASSISTED BILATERAL SALPINGO OOPHORECTOMY;  Surgeon: [name], MD;  Location: [facility];  Service: Gynecology;  Laterality: N/A;  . TONSILLECTOMY    . TUBAL LIGATION    . WRIST SURGERY Right     Family History  Problem Relation Age of Onset  . Cancer Mother        ovarian  . Ovarian cancer Mother   . Heart disease Father 48  . Arthritis Other   . Hyperlipidemia Other   . Hypertension Other   . Diabetes Other   . Anesthesia problems Daughter        NAUSEA AND VOMITING POST OP  . Breast cancer Neg Hx   . Colon cancer Neg Hx   . Colon polyps Neg Hx   . Esophageal cancer Neg Hx   . Stomach cancer Neg Hx   . Rectal cancer Neg Hx     Social History   Socioeconomic History  . Marital status: Married    Spouse name: Not on file  . Number of children: Not on file  .  Years of education: Not on file  . Highest education level: Not on file  Occupational History  . Not on file  Tobacco Use  . Smoking status: Never Smoker  . Smokeless tobacco: Never Used  Vaping Use  . Vaping Use: Never used  Substance and Sexual Activity  . Alcohol use: No  . Drug use: No  . Sexual activity: Never  Other Topics Concern  . Not on file  Social History Narrative  . Not on file   Social Determinants of Health   Financial Resource Strain: Low Risk   . Difficulty of Paying Living Expenses: Not hard at all  Food Insecurity: No Food Insecurity  . Worried About Charity fundraiser in the Last Year: Never true  . Ran Out of Food in the Last Year: Never true  Transportation Needs: No Transportation Needs  . Lack of Transportation (Medical): No  . Lack of Transportation (Non-Medical): No  Physical Activity: Sufficiently Active  . Days of Exercise per Week: 3 days  . Minutes of Exercise per Session: 60 min  Stress: No Stress Concern Present  . Feeling of Stress : Not at all  Social Connections: Moderately Integrated  . Frequency of Communication with Friends and Family: More than three times a week  . Frequency of Social Gatherings with Friends and Family: More than three times a week  . Attends Religious Services: More than 4 times per year  . Active Member of Clubs or Organizations: No  . Attends Archivist Meetings: Never  . Marital Status: Married  Human resources officer Violence: Not At Risk  . Fear of Current or Ex-Partner: No  . Emotionally Abused: No  . Physically Abused: No  . Sexually Abused: No    Outpatient Medications Prior to Visit  Medication Sig Dispense Refill  . ACCU-CHEK AVIVA PLUS test strip TEST BLOOD SUGAR TWICE DAILY. 50 strip 0  . Accu-Chek Softclix Lancets lancets TEST BLOOD SUGAR TWICE DAILY. 100 each 0  . acetaminophen (TYLENOL) 650 MG CR tablet Take 1,300 mg by mouth every 8 (eight) hours as needed for pain.    Marland Kitchen amLODipine  (NORVASC) 5 MG tablet TAKE ONE (1) TABLET EACH DAY 90 tablet 1  . ascorbic acid (VITAMIN C) 500 MG tablet Take 500 mg by mouth daily.    Marland Kitchen  betamethasone dipropionate 0.05 % cream APPLY TO LEGS AS DIRECTED 45 g 0  . cetirizine (ZYRTEC) 10 MG tablet Take 10 mg by mouth daily as needed for allergies.     . Cyanocobalamin (VITAMIN B12 PO) Take 1 mL by mouth daily. OTC    . furosemide (LASIX) 40 MG tablet Take 0.5 tablets (20 mg total) by mouth daily as needed. For fluid 30 tablet 3  . Iron-Vitamins (GERITOL COMPLETE PO) Take 1 tablet by mouth daily.    Marland Kitchen JANUMET XR [id number] MG TB24 TAKE 1 TABLET DAILY AT LUNCH 30 tablet 2  . Lancet Devices (ACCU-CHEK SOFTCLIX) lancets 1 each by Other route 2 (two) times daily. E11.9 100 each 3  . levothyroxine (SYNTHROID) 75 MCG tablet TAKE ONE (1) TABLET EACH DAY 90 tablet 0  . losartan-hydrochlorothiazide (HYZAAR) 100-12.5 MG tablet Take 1 tablet by mouth daily. (Patient taking differently: Take 0.5 tablets by mouth daily.) 90 tablet 3  . Magnesium 250 MG TABS Take 250 mg by mouth daily.    . meclizine (ANTIVERT) 25 MG tablet Take 25 mg by mouth 3 (three) times daily as needed for dizziness.     . metoprolol succinate (TOPROL-XL) 100 MG 24 hr tablet TAKE ONE (1) TABLET EACH DAY 90 tablet 1  . OVER THE COUNTER MEDICATION Apply 1 application topically daily as needed (pain). Oxyrub otc pain relieving gel    . potassium chloride (KLOR-CON) 10 MEQ tablet TAKE ONE (1) TABLET EACH DAY 90 tablet 1  . Propylene Glycol (SYSTANE BALANCE) 0.6 % SOLN Place 1 drop into both eyes daily.    . RABEprazole (ACIPHEX) 20 MG tablet TAKE ONE (1) TABLET EACH DAY 90 tablet 1  . rosuvastatin (CRESTOR) 40 MG tablet TAKE ONE (1) TABLET EACH DAY 90 tablet 2  . solifenacin (VESICARE) 5 MG tablet TAKE ONE (1) TABLET EACH DAY (Patient taking differently: Take 5 mg by mouth daily.) 30 tablet 1  . valACYclovir (VALTREX) 1000 MG tablet TAKE 2 TABS TWICE DAILY FOR 1 DAY THEN AS NEEDED 30 tablet 1   . Carboxymethylcellulose Sodium (REFRESH LIQUIGEL OP) Place 1 drop into both eyes at bedtime. (Patient not taking: Reported on 02/03/2020)     No facility-administered medications prior to visit.    Allergies  Allergen Reactions  . Flagyl [Metronidazole] Other (See Comments)    Caused increased heart rate  . Aspirin Effervescent Swelling    Alka-Seltzer gel caps- sore mouth, face swollen, turned hands white  . Morphine Sulfate Hives    Hallucinations  . Penicillins Swelling and Rash    Has patient had a PCN reaction causing immediate rash, facial/tongue/throat swelling, SOB or lightheadedness with hypotension: Yes Has patient had a PCN reaction causing severe rash involving mucus membranes or skin necrosis: No Has patient had a PCN reaction that required hospitalization No Has patient had a PCN reaction occurring within the last 10 years: No If all of the above answers are "NO", then may proceed with Cephalosporin use.     ROS Review of Systems  Constitutional: Negative for appetite change, fatigue and unexpected weight change.  Eyes: Negative for visual disturbance.  Respiratory: Negative for cough, chest tightness, shortness of breath and wheezing.   Cardiovascular: Negative for chest pain, palpitations and leg swelling.  Gastrointestinal: Negative for abdominal pain.  Endocrine: Positive for cold intolerance. Negative for polydipsia and polyuria.  Genitourinary: Negative for dysuria and hematuria.  Neurological: Negative for dizziness, seizures, syncope, weakness, light-headedness and headaches.      Objective:  Physical Exam Vitals reviewed.  Constitutional:      Appearance: She is well-developed and well-nourished.  Eyes:     Pupils: Pupils are equal, round, and reactive to light.  Neck:     Thyroid: No thyromegaly.     Vascular: No JVD.  Cardiovascular:     Rate and Rhythm: Normal rate and regular rhythm.     Heart sounds: No gallop.   Pulmonary:     Effort:  Pulmonary effort is normal. No respiratory distress.     Breath sounds: Normal breath sounds. No wheezing or rales.  Musculoskeletal:        General: No edema.     Cervical back: Neck supple.     Comments: No pitting edema.  She has compression hose on bilaterally  Neurological:     Mental Status: She is alert.     BP 122/74   Temp 98.1 F (36.7 C) (Tympanic)   Ht 5\' 4"  (1.626 m)   Wt 223 lb 12.8 oz (101.5 kg)   BMI 38.42 kg/m  Wt Readings from Last 3 Encounters:  02/03/20 223 lb 12.8 oz (101.5 kg)  02/02/20 225 lb 3.2 oz (102.2 kg)  11/02/19 220 lb 4.8 oz (99.9 kg)     Health Maintenance Due  Topic Date Due  . FOOT EXAM  12/30/2019    There are no preventive care reminders to display for this patient.  Lab Results  Component Value Date   TSH 3.76 02/02/2020   Lab Results  Component Value Date   WBC 4.6 02/02/2020   HGB 12.3 02/02/2020   HCT 37.9 02/02/2020   MCV 79.6 02/02/2020   PLT 159.0 02/02/2020   Lab Results  Component Value Date   NA 138 02/02/2020   K 4.2 02/02/2020   CO2 30 02/02/2020   GLUCOSE 108 (H) 02/02/2020   BUN 20 02/02/2020   CREATININE 1.34 (H) 02/02/2020   BILITOT 0.8 02/02/2020   ALKPHOS 83 02/02/2020   AST 21 02/02/2020   ALT 11 02/02/2020   PROT 6.7 02/02/2020   ALBUMIN 4.4 02/02/2020   CALCIUM 9.1 02/02/2020   ANIONGAP 10 07/03/2019   GFR 38.33 (L) 02/02/2020   Lab Results  Component Value Date   CHOL 174 02/02/2020   Lab Results  Component Value Date   HDL 83.10 02/02/2020   Lab Results  Component Value Date   LDLCALC 76 02/02/2020   Lab Results  Component Value Date   TRIG 73.0 02/02/2020   Lab Results  Component Value Date   CHOLHDL 2 02/02/2020   Lab Results  Component Value Date   HGBA1C 6.0 (A) 02/02/2020      Assessment & Plan:   #1 chronic kidney disease stage III.  Recent slight decline by labs yesterday but this appears to be more of a volume contraction issue.  She was not well-hydrated she  states that time labs were drawn.  Thankfully, her diabetes and blood pressure been very well controlled.  -We discussed the importance of good hydration and avoidance of things like nonsteroidals which could worsen kidney control.  She had negative urine micro-albumin -We recommend repeat basic metabolic panel in about 1 month and make sure she is very well-hydrated at the time this is drawn.  #2 recent left side pain.  This appears to be more musculoskeletal.  No abdominal pain.  No red flag symptoms such as fever, chills, weight loss, etc.  #3 hypertension well-controlled  #4 type 2 diabetes well controlled with A1c  6.0%  No orders of the defined types were placed in this encounter.   Follow-up: No follow-ups on file.    Carolann Littler, MD

## 2020-02-03 NOTE — Patient Instructions (Signed)
Chronic Kidney Disease, Adult Chronic kidney disease (CKD) occurs when the kidneys are slowly and permanently damaged over a long period of time. The kidneys are a pair of organs that do many important jobs in the body, including:  Removing waste and extra fluid from the blood to make urine.  Making hormones that maintain the amount of fluid in tissues and blood vessels.  Maintaining the right amount of fluids and chemicals in the body. A small amount of kidney damage may not cause problems, but a large amount of damage may make it hard or impossible for the kidneys to work right. Steps must be taken to slow kidney damage or to stop it from getting worse. If steps are not taken, the kidneys may stop working permanently (end-stage renal disease, or ESRD). Most of the time, CKD does not go away, but it can often be controlled. People who have CKD are usually able to live full lives. What are the causes? The most common causes of this condition are diabetes and high blood pressure (hypertension). Other causes include:  Cardiovascular diseases. These affect the heart and blood vessels.  Kidney diseases. These include: ? Glomerulonephritis, or inflammation of the tiny filters in the kidneys. ? Interstitial nephritis. This is swelling of the small tubes of the kidneys and of the surrounding structures. ? Polycystic kidney disease, in which clusters of fluid-filled sacs form within the kidneys. ? Renal vascular disease. This includes disorders that affect the arteries and veins of the kidneys.  Diseases that affect the body's defense system (immune system).  A problem with urine flow. This may be caused by: ? Kidney stones. ? Cancer. ? An enlarged prostate, in males.  A kidney infection or urinary tract infection (UTI) that keeps coming back.  Vasculitis. This is swelling or inflammation of the blood vessels. What increases the risk? Your chances of having kidney disease increase with  age. The following factors may make you more likely to develop this condition:  A family history of kidney disease or kidney failure. Kidney failure means the kidneys can no longer work right.  Certain genetic diseases.  Taking medicines often that are damaging to the kidneys.  Being around or being in contact with toxic substances.  Obesity.  A history of tobacco use. What are the signs or symptoms? Symptoms of this condition include:  Feeling very tired (lethargic) and having less energy.  Swelling, or edema, of the face, legs, ankles, or feet.  Nausea or vomiting, or loss of appetite.  Confusion or trouble concentrating.  Muscle twitches and cramps, especially in the legs.  Dry, itchy skin.  A metallic taste in the mouth.  Producing less urine, or producing more urine (especially at night).  Shortness of breath.  Trouble sleeping. CKD may also result in not having enough red blood cells or hemoglobin in the blood (anemia) or having weak bones (bone disease). Symptoms develop slowly and may not be obvious until the kidney damage becomes severe. It is possible to have kidney disease for years without having symptoms. How is this diagnosed? This condition may be diagnosed based on:  Blood tests.  Urine tests.  Imaging tests, such as an ultrasound or a CT scan.  A kidney biopsy. This involves removing a sample of kidney tissue to be looked at under a microscope. Results from these tests will help to determine how serious the CKD is. How is this treated? There is no cure for most cases of this condition, but treatment usually relieves   symptoms and prevents or slows the worsening of the disease. Treatment may include:  Diet changes, which may require you to avoid alcohol and foods that are high in salt, potassium, phosphorous, and protein.  Medicines. These may: ? Lower blood pressure. ? Control blood sugar (glucose). ? Relieve anemia. ? Relieve  swelling. ? Protect your bones. ? Improve the balance of salts and minerals in your blood (electrolytes).  Dialysis, which is a type of treatment that removes toxic waste from the body. It may be needed if you have kidney failure.  Managing any other conditions that are causing your CKD or making it worse. Follow these instructions at home: Medicines  Take over-the-counter and prescription medicines only as told by your health care provider. The amount of some medicines that you take may need to be changed.  Do not take any new medicines unless approved by your health care provider. Many medicines can make kidney damage worse.  Do not take any vitamin and mineral supplements unless approved by your health care provider. Many nutritional supplements can make kidney damage worse. Lifestyle  Do not use any products that contain nicotine or tobacco, such as cigarettes, e-cigarettes, and chewing tobacco. If you need help quitting, ask your health care provider.  If you drink alcohol: ? Limit how much you use to:  0-1 drink a day for women who are not pregnant.  0-2 drinks a day for men. ? Know how much alcohol is in your drink. In the [place] one drink equals one 12 oz bottle of beer (355 mL), one 5 oz glass of wine (148 mL), or one 1 oz glass of hard liquor (44 mL).  Maintain a healthy weight. If you need help, ask your health care provider.   General instructions  Follow instructions from your health care provider about eating or drinking restrictions, including any prescribed diet.  Track your blood pressure at home. Report changes in your blood pressure as told.  If you are being treated for diabetes, track your blood glucose levels as told.  Start or continue an exercise plan. Exercise at least 30 minutes a day, 5 days a week.  Keep your immunizations up to date as told.  Keep all follow-up visits. This is important.   Where to find more information  American Association of  Kidney Patients: www.aakp.org  National Kidney Foundation: www.kidney.org  American Kidney Fund: www.akfinc.org  Life Options: www.lifeoptions.org  Kidney School: www.kidneyschool.org Contact a health care provider if:  Your symptoms get worse.  You develop new symptoms. Get help right away if:  You develop symptoms of ESRD. These include: ? Headaches. ? Numbness in your hands or feet. ? Easy bruising. ? Frequent hiccups. ? Chest pain. ? Shortness of breath. ? Lack of menstrual periods, in women.  You have a fever.  You are producing less urine than usual.  You have pain or bleeding when you urinate or when you have a bowel movement. These symptoms may represent a serious problem that is an emergency. Do not wait to see if the symptoms will go away. Get medical help right away. Call your local emergency services (911 in the [place] Do not drive yourself to the hospital. Summary  Chronic kidney disease (CKD) occurs when the kidneys become damaged slowly over a long period of time.  The most common causes of this condition are diabetes and high blood pressure (hypertension).  There is no cure for most cases of CKD, but treatment usually relieves symptoms and prevents   or slows the worsening of the disease. Treatment may include a combination of lifestyle changes, medicines, and dialysis. This information is not intended to replace advice given to you by your health care provider. Make sure you discuss any questions you have with your health care provider. Document Revised: 04/15/2019 Document Reviewed: 04/15/2019 Elsevier Patient Education  2021 [employer]vier Inc.  

## 2020-02-06 ENCOUNTER — Other Ambulatory Visit: Payer: Self-pay | Admitting: Endocrinology

## 2020-02-06 ENCOUNTER — Other Ambulatory Visit: Payer: Self-pay | Admitting: Family Medicine

## 2020-02-23 ENCOUNTER — Other Ambulatory Visit: Payer: Self-pay

## 2020-02-23 ENCOUNTER — Other Ambulatory Visit (INDEPENDENT_AMBULATORY_CARE_PROVIDER_SITE_OTHER): Payer: Medicare Other

## 2020-02-23 ENCOUNTER — Other Ambulatory Visit: Payer: Self-pay | Admitting: Family Medicine

## 2020-02-23 DIAGNOSIS — N1832 Chronic kidney disease, stage 3b: Secondary | ICD-10-CM | POA: Diagnosis not present

## 2020-02-23 NOTE — Addendum Note (Signed)
Addended by: Tessie Fass D on: 02/23/2020 04:09 PM   Modules accepted: Orders

## 2020-02-24 ENCOUNTER — Telehealth: Payer: Self-pay

## 2020-02-24 LAB — BASIC METABOLIC PANEL
BUN: 14 mg/dL (ref 6–23)
CO2: 30 mEq/L (ref 19–32)
Calcium: 9.3 mg/dL (ref 8.4–10.5)
Chloride: 103 mEq/L (ref 96–112)
Creatinine, Ser: 1.24 mg/dL — ABNORMAL HIGH (ref 0.40–1.20)
GFR: 42.06 mL/min — ABNORMAL LOW (ref >60.00)
Glucose, Bld: 109 mg/dL — ABNORMAL HIGH (ref 70–99)
Potassium: 4.1 mEq/L (ref 3.5–5.1)
Sodium: 140 mEq/L (ref 135–145)

## 2020-02-24 NOTE — Telephone Encounter (Signed)
-----   Message from Eulas Post, MD sent at 02/24/2020  1:01 PM EST ----- Renal function is stable.  Continue to stay well-hydrated and avoid nonsteroidals.

## 2020-02-24 NOTE — Telephone Encounter (Signed)
Informed patient of results and recommendations. 

## 2020-03-14 ENCOUNTER — Encounter: Payer: Self-pay | Admitting: Family Medicine

## 2020-03-14 ENCOUNTER — Ambulatory Visit (INDEPENDENT_AMBULATORY_CARE_PROVIDER_SITE_OTHER): Payer: Medicare Other | Admitting: Family Medicine

## 2020-03-14 ENCOUNTER — Other Ambulatory Visit: Payer: Self-pay

## 2020-03-14 VITALS — BP 118/80 | HR 84 | Ht 64.0 in | Wt 225.0 lb

## 2020-03-14 DIAGNOSIS — R300 Dysuria: Secondary | ICD-10-CM

## 2020-03-14 LAB — POCT URINALYSIS DIPSTICK
Bilirubin, UA: NEGATIVE
Glucose, UA: NEGATIVE
Ketones, UA: NEGATIVE
Nitrite, UA: NEGATIVE
Protein, UA: NEGATIVE
Spec Grav, UA: 1.02 (ref 1.010–1.025)
Urobilinogen, UA: 0.2 E.U./dL
pH, UA: 6.5 (ref 5.0–8.0)

## 2020-03-14 MED ORDER — CEPHALEXIN 500 MG PO CAPS: 500.0000 mg | ORAL_CAPSULE | Freq: Three times a day (TID) | ORAL | 0 refills | Status: DC

## 2020-03-14 NOTE — Patient Instructions (Signed)
Urinary Tract Infection, Adult  A urinary tract infection (UTI) is an infection of any part of the urinary tract. The urinary tract includes the kidneys, ureters, bladder, and urethra. These organs make, store, and get rid of urine in the body. An upper UTI affects the ureters and kidneys. A lower UTI affects the bladder and urethra. What are the causes? Most urinary tract infections are caused by bacteria in your genital area around your urethra, where urine leaves your body. These bacteria grow and cause inflammation of your urinary tract. What increases the risk? You are more likely to develop this condition if:  You have a urinary catheter that stays in place.  You are not able to control when you urinate or have a bowel movement (incontinence).  You are female and you: ? Use a spermicide or diaphragm for birth control. ? Have low estrogen levels. ? Are pregnant.  You have certain genes that increase your risk.  You are sexually active.  You take antibiotic medicines.  You have a condition that causes your flow of urine to slow down, such as: ? An enlarged prostate, if you are female. ? Blockage in your urethra. ? A kidney stone. ? A nerve condition that affects your bladder control (neurogenic bladder). ? Not getting enough to drink, or not urinating often.  You have certain medical conditions, such as: ? Diabetes. ? A weak disease-fighting system (immunesystem). ? Sickle cell disease. ? Gout. ? Spinal cord injury. What are the signs or symptoms? Symptoms of this condition include:  Needing to urinate right away (urgency).  Frequent urination. This may include small amounts of urine each time you urinate.  Pain or burning with urination.  Blood in the urine.  Urine that smells bad or unusual.  Trouble urinating.  Cloudy urine.  Vaginal discharge, if you are female.  Pain in the abdomen or the lower back. You may also have:  Vomiting or a decreased  appetite.  Confusion.  Irritability or tiredness.  A fever or chills.  Diarrhea. The first symptom in older adults may be confusion. In some cases, they may not have any symptoms until the infection has worsened. How is this diagnosed? This condition is diagnosed based on your medical history and a physical exam. You may also have other tests, including:  Urine tests.  Blood tests.  Tests for STIs (sexually transmitted infections). If you have had more than one UTI, a cystoscopy or imaging studies may be done to determine the cause of the infections. How is this treated? Treatment for this condition includes:  Antibiotic medicine.  Over-the-counter medicines to treat discomfort.  Drinking enough water to stay hydrated. If you have frequent infections or have other conditions such as a kidney stone, you may need to see a health care provider who specializes in the urinary tract (urologist). In rare cases, urinary tract infections can cause sepsis. Sepsis is a life-threatening condition that occurs when the body responds to an infection. Sepsis is treated in the hospital with IV antibiotics, fluids, and other medicines. Follow these instructions at home: Medicines  Take over-the-counter and prescription medicines only as told by your health care provider.  If you were prescribed an antibiotic medicine, take it as told by your health care provider. Do not stop using the antibiotic even if you start to feel better. General instructions  Make sure you: ? Empty your bladder often and completely. Do not hold urine for long periods of time. ? Empty your bladder after   sex. ? Wipe from front to back after urinating or having a bowel movement if you are female. Use each tissue only one time when you wipe.  Drink enough fluid to keep your urine pale yellow.  Keep all follow-up visits. This is important.   Contact a health care provider if:  Your symptoms do not get better after 1-2  days.  Your symptoms go away and then return. Get help right away if:  You have severe pain in your back or your lower abdomen.  You have a fever or chills.  You have nausea or vomiting. Summary  A urinary tract infection (UTI) is an infection of any part of the urinary tract, which includes the kidneys, ureters, bladder, and urethra.  Most urinary tract infections are caused by bacteria in your genital area.  Treatment for this condition often includes antibiotic medicines.  If you were prescribed an antibiotic medicine, take it as told by your health care provider. Do not stop using the antibiotic even if you start to feel better.  Keep all follow-up visits. This is important. This information is not intended to replace advice given to you by your health care provider. Make sure you discuss any questions you have with your health care provider. Document Revised: 08/21/2019 Document Reviewed: 08/21/2019 Elsevier Patient Education  2021 Elsevier Inc.  

## 2020-03-14 NOTE — Progress Notes (Signed)
Established Patient Office Visit  Subjective:  Patient ID: Dawn Salas, female    DOB: 1940-06-26  Age: 80 y.o. MRN: [id number]  CC:  Chief Complaint  Patient presents with  . Dysuria    HPI Dawn Salas presents for 1 day history of dysuria.  Started yesterday with some frequency and burning with urination.  No gross hematuria.  No flank pain.  No nausea or vomiting.  Last UTI was back in 2019.  She had E. coli that time.  She has tolerated Keflex in the past.  She has reported penicillin allergy but no anaphylaxis.  She does have type 2 diabetes which has been very well controlled.  Last A1c 6.0%.    Past Medical History:  Diagnosis Date  . Allergy   . Anxiety   . Arthritis   . Asthma    patient denies   . Cataract   . Depression   . Diabetes mellitus    Type two  . Diverticulitis   . Family history of adverse reaction to anesthesia    daughter- N/V   . Frequency of urination   . GERD (gastroesophageal reflux disease)   . History of hiatal hernia   . Hyperglycemia   . Hyperlipidemia   . Hypertension   . Hyperthyroidism    PT HAD BIPOSY -NEGATIVE.Marland Kitchen AND NOW JUST GETS CHECKED EACH YEAR .Marland Kitchen 2013 LAST TEST   SEES DR. [name].   . 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 Obstructive sleep apnea    STUDY AT [facility] DOES NOT USE C PAP  . Palpitations   . Peptic ulcer disease   . Pneumonia    "walking pneumonia" hx of  . Seizures ([place]) 2015   one siezure with brain surgery none since 2015   . Sleep apnea    no CPAP    Past Surgical History:  Procedure Laterality Date  . ABDOMINAL HYSTERECTOMY    . BACK SURGERY     L SPIN 2003   NECK 2004  . BRAIN MENINGIOMA EXCISION  8/10   X 2  . C5-C6 neck fusion    . COLONOSCOPY  05/05/2009  . CRANIOTOMY  08/13/2011   Procedure: CRANIOTOMY TUMOR EXCISION;  Surgeon: [name], MD;  Location: [facility];  Service: Neurosurgery;  Laterality: Bilateral;  Bifrontal craniotomy for tumor  . DILATION AND CURETTAGE  OF UTERUS     YEARS AGO  . L4-L5 posterior la    . left foot plantar and hammertoe    . right foot bunectomy    . right knee arthroscopy     x 3  . right shoulder rotator cuff    . ROBOTIC ASSISTED BILATERAL SALPINGO OOPHERECTOMY N/A 08/25/2015   Procedure: XI ROBOTIC ASSISTED BILATERAL SALPINGO OOPHORECTOMY;  Surgeon: [name], MD;  Location: [facility];  Service: Gynecology;  Laterality: N/A;  . TONSILLECTOMY    . TUBAL LIGATION    . WRIST SURGERY Right     Family History  Problem Relation Age of Onset  . Cancer Mother        ovarian  . Ovarian cancer Mother   . Heart disease Father 44  . Arthritis Other   . Hyperlipidemia Other   . Hypertension Other   . Diabetes Other   . Anesthesia problems Daughter        NAUSEA AND VOMITING POST OP  . Breast cancer Neg Hx   . Colon cancer Neg Hx   .  Colon polyps Neg Hx   . Esophageal cancer Neg Hx   . Stomach cancer Neg Hx   . Rectal cancer Neg Hx     Social History   Socioeconomic History  . Marital status: Married    Spouse name: Not on file  . Number of children: Not on file  . Years of education: Not on file  . Highest education level: Not on file  Occupational History  . Not on file  Tobacco Use  . Smoking status: Never Smoker  . Smokeless tobacco: Never Used  Vaping Use  . Vaping Use: Never used  Substance and Sexual Activity  . Alcohol use: No  . Drug use: No  . Sexual activity: Never  Other Topics Concern  . Not on file  Social History Narrative  . Not on file   Social Determinants of Health   Financial Resource Strain: Low Risk   . Difficulty of Paying Living Expenses: Not hard at all  Food Insecurity: No Food Insecurity  . Worried About Charity fundraiser in the Last Year: Never true  . Ran Out of Food in the Last Year: Never true  Transportation Needs: No Transportation Needs  . Lack of Transportation (Medical): No  . Lack of Transportation (Non-Medical): No  Physical Activity: Sufficiently  Active  . Days of Exercise per Week: 3 days  . Minutes of Exercise per Session: 60 min  Stress: No Stress Concern Present  . Feeling of Stress : Not at all  Social Connections: Moderately Integrated  . Frequency of Communication with Friends and Family: More than three times a week  . Frequency of Social Gatherings with Friends and Family: More than three times a week  . Attends Religious Services: More than 4 times per year  . Active Member of Clubs or Organizations: No  . Attends Archivist Meetings: Never  . Marital Status: Married  Human resources officer Violence: Not At Risk  . Fear of Current or Ex-Partner: No  . Emotionally Abused: No  . Physically Abused: No  . Sexually Abused: No    Outpatient Medications Prior to Visit  Medication Sig Dispense Refill  . ACCU-CHEK AVIVA PLUS test strip TEST BLOOD SUGAR TWICE DAILY. 50 strip 0  . Accu-Chek Softclix Lancets lancets TEST BLOOD SUGAR TWICE DAILY. 100 each 0  . acetaminophen (TYLENOL) 650 MG CR tablet Take 1,300 mg by mouth every 8 (eight) hours as needed for pain.    Marland Kitchen amLODipine (NORVASC) 5 MG tablet TAKE ONE (1) TABLET EACH DAY 90 tablet 1  . ascorbic acid (VITAMIN C) 500 MG tablet Take 500 mg by mouth daily.    . betamethasone dipropionate 0.05 % cream APPLY TO LEGS AS DIRECTED 45 g 0  . cetirizine (ZYRTEC) 10 MG tablet Take 10 mg by mouth daily as needed for allergies.     . Cyanocobalamin (VITAMIN B12 PO) Take 1 mL by mouth daily. OTC    . furosemide (LASIX) 40 MG tablet Take 0.5 tablets (20 mg total) by mouth daily as needed. For fluid 30 tablet 3  . Iron-Vitamins (GERITOL COMPLETE PO) Take 1 tablet by mouth daily.    Marland Kitchen JANUMET XR [phone] MG TB24 TAKE 1 TABLET DAILY AT LUNCH 30 tablet 2  . Lancet Devices (ACCU-CHEK SOFTCLIX) lancets 1 each by Other route 2 (two) times daily. E11.9 100 each 3  . levothyroxine (SYNTHROID) 75 MCG tablet TAKE ONE (1) TABLET EACH DAY 90 tablet 0  . losartan-hydrochlorothiazide (HYZAAR)  100-12.5  MG tablet Take 1 tablet by mouth daily. (Patient taking differently: Take 0.5 tablets by mouth daily.) 90 tablet 3  . Magnesium 250 MG TABS Take 250 mg by mouth daily.    . meclizine (ANTIVERT) 25 MG tablet Take 25 mg by mouth 3 (three) times daily as needed for dizziness.     . metoprolol succinate (TOPROL-XL) 100 MG 24 hr tablet TAKE ONE (1) TABLET EACH DAY 90 tablet 1  . OVER THE COUNTER MEDICATION Apply 1 application topically daily as needed (pain). Oxyrub otc pain relieving gel    . potassium chloride (KLOR-CON) 10 MEQ tablet TAKE ONE (1) TABLET EACH DAY 90 tablet 1  . Propylene Glycol (SYSTANE BALANCE) 0.6 % SOLN Place 1 drop into both eyes daily.    . RABEprazole (ACIPHEX) 20 MG tablet TAKE ONE (1) TABLET EACH DAY 90 tablet 1  . rosuvastatin (CRESTOR) 40 MG tablet TAKE ONE (1) TABLET EACH DAY 90 tablet 2  . solifenacin (VESICARE) 5 MG tablet TAKE ONE (1) TABLET EACH DAY (Patient taking differently: Take 5 mg by mouth daily.) 30 tablet 1  . valACYclovir (VALTREX) 1000 MG tablet TAKE 2 TABS TWICE DAILY FOR 1 DAY THEN AS NEEDED 30 tablet 1  . Carboxymethylcellulose Sodium (REFRESH LIQUIGEL OP) Place 1 drop into both eyes at bedtime. (Patient not taking: Reported on 02/03/2020)     No facility-administered medications prior to visit.    Allergies  Allergen Reactions  . Flagyl [Metronidazole] Other (See Comments)    Caused increased heart rate  . Aspirin Effervescent Swelling    Alka-Seltzer gel caps- sore mouth, face swollen, turned hands white  . Morphine Sulfate Hives    Hallucinations  . Penicillins Swelling and Rash    Has patient had a PCN reaction causing immediate rash, facial/tongue/throat swelling, SOB or lightheadedness with hypotension: Yes Has patient had a PCN reaction causing severe rash involving mucus membranes or skin necrosis: No Has patient had a PCN reaction that required hospitalization No Has patient had a PCN reaction occurring within the last 10 years:  No If all of the above answers are "NO", then may proceed with Cephalosporin use.     ROS Review of Systems  Constitutional: Negative for appetite change, chills and fever.  Gastrointestinal: Negative for abdominal pain, constipation, diarrhea, nausea and vomiting.  Genitourinary: Positive for dysuria and frequency.  Musculoskeletal: Negative for back pain.  Neurological: Negative for dizziness.      Objective:    Physical Exam Vitals reviewed.  Constitutional:      Appearance: Normal appearance.  Cardiovascular:     Rate and Rhythm: Normal rate and regular rhythm.  Pulmonary:     Effort: Pulmonary effort is normal.     Breath sounds: Normal breath sounds.  Neurological:     Mental Status: She is alert.     BP 118/80   Pulse 84   Ht 5\' 4"  (1.626 m)   Wt 225 lb (102.1 kg)   SpO2 99%   BMI 38.62 kg/m  Wt Readings from Last 3 Encounters:  03/14/20 225 lb (102.1 kg)  02/03/20 223 lb 12.8 oz (101.5 kg)  02/02/20 225 lb 3.2 oz (102.2 kg)     Health Maintenance Due  Topic Date Due  . FOOT EXAM  12/30/2019    There are no preventive care reminders to display for this patient.  Lab Results  Component Value Date   TSH 3.76 02/02/2020   Lab Results  Component Value Date   WBC 4.6  02/02/2020   HGB 12.3 02/02/2020   HCT 37.9 02/02/2020   MCV 79.6 02/02/2020   PLT 159.0 02/02/2020   Lab Results  Component Value Date   NA 140 02/23/2020   K 4.1 02/23/2020   CO2 30 02/23/2020   GLUCOSE 109 (H) 02/23/2020   BUN 14 02/23/2020   CREATININE 1.24 (H) 02/23/2020   BILITOT 0.8 02/02/2020   ALKPHOS 83 02/02/2020   AST 21 02/02/2020   ALT 11 02/02/2020   PROT 6.7 02/02/2020   ALBUMIN 4.4 02/02/2020   CALCIUM 9.3 02/23/2020   ANIONGAP 10 07/03/2019   GFR 42.06 (L) 02/23/2020   Lab Results  Component Value Date   CHOL 174 02/02/2020   Lab Results  Component Value Date   HDL 83.10 02/02/2020   Lab Results  Component Value Date   LDLCALC 76 02/02/2020    Lab Results  Component Value Date   TRIG 73.0 02/02/2020   Lab Results  Component Value Date   CHOLHDL 2 02/02/2020   Lab Results  Component Value Date   HGBA1C 6.0 (A) 02/02/2020      Assessment & Plan:   Dysuria-suspect uncomplicated cystitis. Dipstick shows leukocytes and blood.  -Urine culture sent -Keflex 500 mg 3 times daily for 5 days -Plenty of fluids -Follow-up for any persistent or worsening symptoms  Meds ordered this encounter  Medications  . cephALEXin (KEFLEX) 500 MG capsule    Sig: Take 1 capsule (500 mg total) by mouth 3 (three) times daily.    Dispense:  15 capsule    Refill:  0    Follow-up: No follow-ups on file.    Carolann Littler, MD

## 2020-03-16 LAB — URINE CULTURE
MICRO NUMBER:: 11559008
SPECIMEN QUALITY:: ADEQUATE

## 2020-03-22 DIAGNOSIS — N390 Urinary tract infection, site not specified: Secondary | ICD-10-CM

## 2020-03-22 HISTORY — DX: Urinary tract infection, site not specified: N39.0

## 2020-03-23 ENCOUNTER — Other Ambulatory Visit: Payer: Self-pay | Admitting: Endocrinology

## 2020-03-23 ENCOUNTER — Telehealth: Payer: Self-pay | Admitting: Family Medicine

## 2020-03-23 MED ORDER — CEPHALEXIN 500 MG PO CAPS: 500.0000 mg | ORAL_CAPSULE | Freq: Three times a day (TID) | ORAL | 0 refills | Status: DC

## 2020-03-23 NOTE — Telephone Encounter (Signed)
Keflex rx sent to pharmacy patient informed.

## 2020-03-23 NOTE — Telephone Encounter (Signed)
Patient is calling and wanted to see if provider could call in another round of antibiotics for her UTI because it has came back, please advise. CB is [phone]

## 2020-03-23 NOTE — Telephone Encounter (Signed)
May refill Keflex 500 mg 3 times daily for 5 days.  If she has another recurrence after this go around we will recommend follow-up to discuss possible prophylaxis options

## 2020-04-11 ENCOUNTER — Other Ambulatory Visit: Payer: Self-pay | Admitting: Family Medicine

## 2020-04-11 NOTE — Telephone Encounter (Signed)
Okay to refill? 

## 2020-04-11 NOTE — Telephone Encounter (Signed)
Okay to refill valacyclovir.  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

## 2020-04-11 NOTE — Telephone Encounter (Signed)
Last refill- 01/02/2017 Last office visit- 03/14/2020  Can this patient receive a refill?

## 2020-05-05 ENCOUNTER — Other Ambulatory Visit: Payer: Self-pay | Admitting: Family Medicine

## 2020-05-16 ENCOUNTER — Other Ambulatory Visit: Payer: Self-pay | Admitting: Endocrinology

## 2020-06-01 ENCOUNTER — Other Ambulatory Visit: Payer: Self-pay

## 2020-06-01 ENCOUNTER — Ambulatory Visit (AMBULATORY_SURGERY_CENTER): Payer: Medicare Other | Admitting: *Deleted

## 2020-06-01 VITALS — Ht 64.0 in | Wt 223.0 lb

## 2020-06-01 DIAGNOSIS — Z86010 Personal history of colonic polyps: Secondary | ICD-10-CM

## 2020-06-01 MED ORDER — NA SULFATE-K SULFATE-MG SULF 17.5-3.13-1.6 GM/177ML PO SOLN: 1.0000 | Freq: Once | ORAL | 0 refills | Status: AC

## 2020-06-01 NOTE — Progress Notes (Signed)
No egg or soy allergy known to patient  No issues with past sedation with any surgeries or procedures Patient denies ever being told they had issues or difficulty with intubation  No FH of Malignant Hyperthermia No diet pills per patient No home 02 use per patient  No blood thinners per patient  Pt denies issues with constipation  No A fib or A flutter  EMMI video to pt or via MyChart  COVID 19 guidelines implemented in [facility] today with Pt and RN  Pt is fully vaccinated  for Covid   Virtual pre visit completed. Instructions mailed.   Due to the COVID-19 pandemic we are asking patients to follow certain guidelines.  Pt aware of COVID protocols and [facility] guidelines  

## 2020-06-07 ENCOUNTER — Telehealth: Payer: Self-pay | Admitting: Gastroenterology

## 2020-06-07 DIAGNOSIS — Z860100 Personal history of colon polyps, unspecified: Secondary | ICD-10-CM

## 2020-06-07 MED ORDER — PEG-KCL-NACL-NASULF-NA ASC-C 100 G PO SOLR: 1.0000 | Freq: Once | ORAL | 0 refills | Status: AC

## 2020-06-07 NOTE — Telephone Encounter (Signed)
Could you please help this pt. Thank you

## 2020-06-07 NOTE — Telephone Encounter (Signed)
Patient states movi prep is covered by insurance. Moviprep rx sent to pharmacy and new instructions mailed to pt-pt is aware. She will call us with questions.

## 2020-06-07 NOTE — Telephone Encounter (Signed)
Pt called to inform that Suprep does not cover under her ins. She would like to know if it's generic for Suprep. Please give pt a call. Thank you

## 2020-06-14 ENCOUNTER — Encounter: Payer: Self-pay | Admitting: Certified Registered Nurse Anesthetist

## 2020-06-15 ENCOUNTER — Other Ambulatory Visit: Payer: Self-pay

## 2020-06-15 ENCOUNTER — Ambulatory Visit (AMBULATORY_SURGERY_CENTER): Payer: Medicare Other | Admitting: Gastroenterology

## 2020-06-15 ENCOUNTER — Encounter: Payer: Self-pay | Admitting: Gastroenterology

## 2020-06-15 VITALS — BP 120/93 | HR 68 | Resp 12

## 2020-06-15 DIAGNOSIS — D123 Benign neoplasm of transverse colon: Secondary | ICD-10-CM

## 2020-06-15 DIAGNOSIS — Z86010 Personal history of colonic polyps: Secondary | ICD-10-CM | POA: Diagnosis not present

## 2020-06-15 DIAGNOSIS — D124 Benign neoplasm of descending colon: Secondary | ICD-10-CM

## 2020-06-15 DIAGNOSIS — D120 Benign neoplasm of cecum: Secondary | ICD-10-CM

## 2020-06-15 MED ORDER — SODIUM CHLORIDE 0.9 % IV SOLN: 500.0000 mL | Freq: Once | INTRAVENOUS | Status: DC

## 2020-06-15 NOTE — Progress Notes (Signed)
Report given to PACU, vss 

## 2020-06-15 NOTE — Patient Instructions (Signed)
YOU HAD AN ENDOSCOPIC PROCEDURE TODAY AT THE [facility]:   Refer to the procedure report that was given to you for any specific questions about what was found during the examination.  If the procedure report does not answer your questions, please call your gastroenterologist to clarify.  If you requested that your care partner not be given the details of your procedure findings, then the procedure report has been included in a sealed envelope for you to review at your convenience later.  YOU SHOULD EXPECT: Some feelings of bloating in the abdomen. Passage of more gas than usual.  Walking can help get rid of the air that was put into your GI tract during the procedure and reduce the bloating. If you had a lower endoscopy (such as a colonoscopy or flexible sigmoidoscopy) you may notice spotting of blood in your stool or on the toilet paper. If you underwent a bowel prep for your procedure, you may not have a normal bowel movement for a few days.  Please Note:  You might notice some irritation and congestion in your nose or some drainage.  This is from the oxygen used during your procedure.  There is no need for concern and it should clear up in a day or so.  SYMPTOMS TO REPORT IMMEDIATELY:   Following lower endoscopy (colonoscopy or flexible sigmoidoscopy):  Excessive amounts of blood in the stool  Significant tenderness or worsening of abdominal pains  Swelling of the abdomen that is new, acute  Fever of 100F or higher  For urgent or emergent issues, a gastroenterologist can be reached at any hour by calling [phone]. Do not use MyChart messaging for urgent concerns.    DIET:  We do recommend a small meal at first, but then you may proceed to your regular diet.  Drink plenty of fluids but you should avoid alcoholic beverages for 24 hours.  ACTIVITY:  You should plan to take it easy for the rest of today and you should NOT DRIVE or use heavy machinery until tomorrow (because  of the sedation medicines used during the test).    FOLLOW UP: Our staff will call the number listed on your records 48-72 hours following your procedure to check on you and address any questions or concerns that you may have regarding the information given to you following your procedure. If we do not reach you, we will leave a message.  We will attempt to reach you two times.  During this call, we will ask if you have developed any symptoms of COVID 19. If you develop any symptoms (ie: fever, flu-like symptoms, shortness of breath, cough etc.) before then, please call [phone].  If you test positive for Covid 19 in the 2 weeks post procedure, please call and report this information to us.    If any biopsies were taken you will be contacted by phone or by letter within the next 1-3 weeks.  Please call us at [phone] if you have not heard about the biopsies in 3 weeks.    SIGNATURES/CONFIDENTIALITY: You and/or your care partner have signed paperwork which will be entered into your electronic medical record.  These signatures attest to the fact that that the information above on your After Visit Summary has been reviewed and is understood.  Full responsibility of the confidentiality of this discharge information lies with you and/or your care-partner. 

## 2020-06-15 NOTE — Progress Notes (Signed)
Called to room to assist during endoscopic procedure.  Patient ID and intended procedure confirmed with present staff. Received instructions for my participation in the procedure from the performing physician.  

## 2020-06-15 NOTE — Op Note (Signed)
[place] Patient Name: Dawn Salas Procedure Date: 06/15/2020 8:20 AM MRN: [id number] Endoscopist: Remo Lipps P. Havery Moros , MD Age: 80 Referring MD:  Date of Birth: 12-26-1940 Gender: Female Account #: [id number] Procedure:                Colonoscopy Indications:              High risk colon cancer surveillance: Personal                            history of colonic polyps - history of 14 adenomas                            / sessile serrated polyps 05/2019 Medicines:                Monitored Anesthesia Care Procedure:                Pre-Anesthesia Assessment:                           - Prior to the procedure, a History and Physical                            was performed, and patient medications and                            allergies were reviewed. The patient's tolerance of                            previous anesthesia was also reviewed. The risks                            and benefits of the procedure and the sedation                            options and risks were discussed with the patient.                            All questions were answered, and informed consent                            was obtained. Prior Anticoagulants: The patient has                            taken no previous anticoagulant or antiplatelet                            agents. ASA Grade Assessment: III - A patient with                            severe systemic disease. After reviewing the risks                            and benefits, the patient was deemed in  satisfactory condition to undergo the procedure.                           After obtaining informed consent, the colonoscope                            was passed under direct vision. Throughout the                            procedure, the patient's blood pressure, pulse, and                            oxygen saturations were monitored continuously. The                            Olympus [id number]-[id number]  (#[id number]) Colonoscope was                            introduced through the anus and advanced to the the                            cecum, identified by appendiceal orifice and                            ileocecal valve. The colonoscopy was performed                            without difficulty. The patient tolerated the                            procedure well. The quality of the bowel                            preparation was good. The ileocecal valve,                            appendiceal orifice, and rectum were photographed. Scope In: 8:30:34 AM Scope Out: 8:57:05 AM Scope Withdrawal Time: 0 hours 18 minutes 43 seconds  Total Procedure Duration: 0 hours 26 minutes 31 seconds  Findings:                 The perianal and digital rectal examinations were                            normal.                           A diminutive polyp was found in the cecum. The                            polyp was sessile. The polyp was removed with a                            cold snare. Resection and retrieval were complete.  Seven sessile polyps were found in the transverse                            colon. The polyps were 3 to 5 mm in size. These                            polyps were removed with a cold snare. Resection                            and retrieval were complete.                           Two sessile polyps were found in the descending                            colon. The polyps were 3 mm in size. These polyps                            were removed with a cold snare. Resection and                            retrieval were complete.                           Multiple small-mouthed diverticula were found in                            the transverse colon and left colon.                           The colon was tortuous.                           Internal hemorrhoids were found during                            retroflexion. The hemorrhoids were small.                            The exam was otherwise without abnormality. Complications:            No immediate complications. Estimated blood loss:                            Minimal. Estimated Blood Loss:     Estimated blood loss was minimal. Impression:               - One diminutive polyp in the cecum, removed with a                            cold snare. Resected and retrieved.                           - Seven 3 to 5 mm polyps in the transverse colon,  removed with a cold snare. Resected and retrieved.                           - Two 3 mm polyps in the descending colon, removed                            with a cold snare. Resected and retrieved.                           - Diverticulosis in the transverse colon and in the                            left colon.                           - Tortuous colon.                           - Internal hemorrhoids.                           - The examination was otherwise normal. Recommendation:           - Patient has a contact number available for                            emergencies. The signs and symptoms of potential                            delayed complications were discussed with the                            patient. Return to normal activities tomorrow.                            Written discharge instructions were provided to the                            patient.                           - Resume previous diet.                           - Continue present medications.                           - Await pathology results.                           - Consideration for genetic testing pending                            pathology results, will discuss with the patient,                            given burden of polyps  on the last 2 exams (24                            polyps total) Remo Lipps P. Havery Moros, MD 06/15/2020 9:02:32 AM This report has been signed electronically.

## 2020-06-15 NOTE — Progress Notes (Signed)
2787 Ephedrine 10 mg given IV due to low BP, MD updated.

## 2020-06-15 NOTE — Progress Notes (Signed)
Pt's states no medical or surgical changes since previsit or office visit. 

## 2020-06-17 ENCOUNTER — Telehealth: Payer: Self-pay

## 2020-06-17 NOTE — Telephone Encounter (Signed)
LVM

## 2020-06-18 ENCOUNTER — Other Ambulatory Visit: Payer: Self-pay | Admitting: Endocrinology

## 2020-06-30 NOTE — Telephone Encounter (Signed)
Inbound call from pt returning your phone call. 

## 2020-07-01 ENCOUNTER — Other Ambulatory Visit: Payer: Self-pay

## 2020-07-01 ENCOUNTER — Telehealth: Payer: Self-pay | Admitting: Genetic Counselor

## 2020-07-01 ENCOUNTER — Other Ambulatory Visit: Payer: Self-pay | Admitting: Family Medicine

## 2020-07-01 DIAGNOSIS — D126 Benign neoplasm of colon, unspecified: Secondary | ICD-10-CM

## 2020-07-01 DIAGNOSIS — D124 Benign neoplasm of descending colon: Secondary | ICD-10-CM

## 2020-07-01 DIAGNOSIS — D123 Benign neoplasm of transverse colon: Secondary | ICD-10-CM

## 2020-07-01 DIAGNOSIS — D120 Benign neoplasm of cecum: Secondary | ICD-10-CM

## 2020-07-01 DIAGNOSIS — Z86010 Personal history of colonic polyps: Secondary | ICD-10-CM

## 2020-07-01 DIAGNOSIS — D125 Benign neoplasm of sigmoid colon: Secondary | ICD-10-CM

## 2020-07-01 DIAGNOSIS — D122 Benign neoplasm of ascending colon: Secondary | ICD-10-CM

## 2020-07-01 NOTE — Telephone Encounter (Signed)
Scheduled appt per 6/9 referral. Pt aware.  

## 2020-07-04 ENCOUNTER — Encounter: Payer: Self-pay | Admitting: Family Medicine

## 2020-07-04 ENCOUNTER — Ambulatory Visit (INDEPENDENT_AMBULATORY_CARE_PROVIDER_SITE_OTHER): Payer: Medicare Other | Admitting: Family Medicine

## 2020-07-04 ENCOUNTER — Other Ambulatory Visit: Payer: Self-pay

## 2020-07-04 VITALS — BP 124/68 | HR 81 | Temp 97.8°F | Wt 224.0 lb

## 2020-07-04 DIAGNOSIS — R300 Dysuria: Secondary | ICD-10-CM

## 2020-07-04 LAB — POCT URINALYSIS DIPSTICK
Bilirubin, UA: NEGATIVE
Blood, UA: POSITIVE
Glucose, UA: NEGATIVE
Ketones, UA: NEGATIVE
Nitrite, UA: NEGATIVE
Protein, UA: POSITIVE — AB
Spec Grav, UA: 1.01 (ref 1.010–1.025)
Urobilinogen, UA: 0.2 E.U./dL
pH, UA: 6 (ref 5.0–8.0)

## 2020-07-04 MED ORDER — CEPHALEXIN 500 MG PO CAPS: 500.0000 mg | ORAL_CAPSULE | Freq: Three times a day (TID) | ORAL | 0 refills | Status: DC

## 2020-07-04 NOTE — Progress Notes (Signed)
Established Patient Office Visit  Subjective:  Patient ID: Dawn Salas, female    DOB: 08-16-1940  Age: 80 y.o. MRN: [id number]  CC:  Chief Complaint  Patient presents with   Urinary Tract Infection    Dysuria, urinary pressure, x 1 day    HPI Dawn Salas presents for onset this morning of some suprapubic pressure and burning with urination.  No gross hematuria.  No fevers or chills.  No flank pain.  She had very similar symptoms back in February and urine culture at that time grew out Klebsiella.  This was resistant to nitrofurantoin and ampicillin.  She did improve with Keflex at that time but did eventually have some recurrent symptoms and saw urologist it was treated with a second go round of antibiotics with Macrobid.  Past Medical History:  Diagnosis Date   Allergy    Anxiety    Arthritis    Asthma    patient denies    Cataract    bilateral lens implants    Depression    Diabetes mellitus    Type two   Diverticulitis    Double vision 2019   left eye   Family history of adverse reaction to anesthesia    daughter- N/V    Frequency of urination    GERD (gastroesophageal reflux disease)    History of hiatal hernia    Hyperglycemia    Hyperlipidemia    Hypertension    Hyperthyroidism    PT HAD BIPOSY -NEGATIVE.Marland Kitchen AND NOW JUST GETS CHECKED EACH YEAR .Marland Kitchen 2013 LAST TEST   SEES DR. [name].    Multiple meningiomas of spine and brain ([place])    Obstructive sleep apnea    STUDY AT [facility] DOES NOT USE C PAP   Palpitations    Peptic ulcer disease    Pneumonia    "walking pneumonia" hx of   Seizures ([place]) 2015   one siezure with brain surgery none since 2015    Sleep apnea    no CPAP   Urinary tract infection 03/2020    Past Surgical History:  Procedure Laterality Date   ABDOMINAL HYSTERECTOMY     BACK SURGERY     L SPIN 2003   NECK 2004   BRAIN MENINGIOMA EXCISION  8/10   X 2   C5-C6 neck fusion     COLONOSCOPY  05/05/2009   CRANIOTOMY  08/13/2011    Procedure: CRANIOTOMY TUMOR EXCISION;  Surgeon: [name], MD;  Location: [facility];  Service: Neurosurgery;  Laterality: Bilateral;  Bifrontal craniotomy for tumor   DILATION AND CURETTAGE OF UTERUS     YEARS AGO   L4-L5 posterior la     left foot plantar and hammertoe     right foot bunectomy     right knee arthroscopy     x 3   right shoulder rotator cuff     ROBOTIC ASSISTED BILATERAL SALPINGO OOPHERECTOMY N/A 08/25/2015   Procedure: XI ROBOTIC ASSISTED BILATERAL SALPINGO OOPHORECTOMY;  Surgeon: [name], MD;  Location: [facility];  Service: Gynecology;  Laterality: N/A;   TONSILLECTOMY     TUBAL LIGATION     WRIST SURGERY Right     Family History  Problem Relation Age of Onset   Cancer Mother        ovarian   Ovarian cancer Mother    Heart disease Father 53   Arthritis Other    Hyperlipidemia Other    Hypertension Other    Diabetes  Other    Anesthesia problems Daughter        NAUSEA AND VOMITING POST OP   Breast cancer Neg Hx    Colon cancer Neg Hx    Colon polyps Neg Hx    Esophageal cancer Neg Hx    Stomach cancer Neg Hx    Rectal cancer Neg Hx     Social History   Socioeconomic History   Marital status: Married    Spouse name: Not on file   Number of children: Not on file   Years of education: Not on file   Highest education level: Not on file  Occupational History   Not on file  Tobacco Use   Smoking status: Never   Smokeless tobacco: Never  Vaping Use   Vaping Use: Never used  Substance and Sexual Activity   Alcohol use: No   Drug use: No   Sexual activity: Never  Other Topics Concern   Not on file  Social History Narrative   Not on file   Social Determinants of Health   Financial Resource Strain: Low Risk    Difficulty of Paying Living Expenses: Not hard at all  Food Insecurity: No Food Insecurity   Worried About Charity fundraiser in the Last Year: Never true   [place] in the Last Year: Never true  Transportation  Needs: No Transportation Needs   Lack of Transportation (Medical): No   Lack of Transportation (Non-Medical): No  Physical Activity: Sufficiently Active   Days of Exercise per Week: 3 days   Minutes of Exercise per Session: 60 min  Stress: No Stress Concern Present   Feeling of Stress : Not at all  Social Connections: Moderately Integrated   Frequency of Communication with Friends and Family: More than three times a week   Frequency of Social Gatherings with Friends and Family: More than three times a week   Attends Religious Services: More than 4 times per year   Active Member of [employer] or Organizations: No   Attends Archivist Meetings: Never   Marital Status: Married  Human resources officer Violence: Not At Risk   Fear of Current or Ex-Partner: No   Emotionally Abused: No   Physically Abused: No   Sexually Abused: No    Outpatient Medications Prior to Visit  Medication Sig Dispense Refill   ACCU-CHEK AVIVA PLUS test strip TEST BLOOD SUGAR TWICE DAILY. 50 strip 0   Accu-Chek Softclix Lancets lancets TEST BLOOD SUGAR TWICE DAILY. 100 each 0   acetaminophen (TYLENOL) 650 MG CR tablet Take 1,300 mg by mouth every 8 (eight) hours as needed for pain.     amLODipine (NORVASC) 5 MG tablet TAKE ONE (1) TABLET EACH DAY 90 tablet 1   ascorbic acid (VITAMIN C) 500 MG tablet Take 500 mg by mouth daily.     betamethasone dipropionate 0.05 % cream APPLY TO LEGS AS DIRECTED 45 g 0   cetirizine (ZYRTEC) 10 MG tablet Take 10 mg by mouth daily as needed for allergies.      Cyanocobalamin (VITAMIN B12 PO) Take 1 mL by mouth daily. OTC     furosemide (LASIX) 40 MG tablet Take 0.5 tablets (20 mg total) by mouth daily as needed. For fluid 30 tablet 3   Iron-Vitamins (GERITOL COMPLETE PO) Take 1 tablet by mouth daily.     JANUMET XR [phone] MG TB24 TAKE 1 TABLET DAILY AT LUNCH 90 tablet 1   Lancet Devices (ACCU-CHEK SOFTCLIX) lancets 1 each  by Other route 2 (two) times daily. E11.9 100 each 3    levothyroxine (SYNTHROID) 75 MCG tablet Take 1 tablet (75 mcg total) by mouth daily before breakfast. 90 tablet 1   losartan-hydrochlorothiazide (HYZAAR) 100-12.5 MG tablet Take 1 tablet by mouth daily. (Patient taking differently: Take 0.5 tablets by mouth daily.) 90 tablet 3   Magnesium 250 MG TABS Take 250 mg by mouth daily.     meclizine (ANTIVERT) 25 MG tablet Take 25 mg by mouth 3 (three) times daily as needed for dizziness.      metoprolol succinate (TOPROL-XL) 100 MG 24 hr tablet TAKE ONE (1) TABLET EACH DAY 90 tablet 1   OVER THE COUNTER MEDICATION Apply 1 application topically daily as needed (pain). Oxyrub otc pain relieving gel     potassium chloride (KLOR-CON) 10 MEQ tablet TAKE ONE (1) TABLET EACH DAY 90 tablet 1   Propylene Glycol (SYSTANE BALANCE) 0.6 % SOLN Place 1 drop into both eyes daily.     RABEprazole (ACIPHEX) 20 MG tablet TAKE ONE (1) TABLET EACH DAY 90 tablet 1   rosuvastatin (CRESTOR) 40 MG tablet TAKE ONE (1) TABLET EACH DAY 90 tablet 2   solifenacin (VESICARE) 5 MG tablet TAKE ONE (1) TABLET EACH DAY (Patient taking differently: Take 5 mg by mouth daily.) 30 tablet 1   valACYclovir (VALTREX) 1000 MG tablet TAKE 2 TABS TWICE DAILY FOR 1 DAY THEN AS NEEDED 30 tablet 1   No facility-administered medications prior to visit.    Allergies  Allergen Reactions   Flagyl [Metronidazole] Other (See Comments)    Caused increased heart rate   Aspirin Effervescent Swelling    Alka-Seltzer gel caps- sore mouth, face swollen, turned hands white   Morphine Sulfate Hives    Hallucinations   Penicillins Swelling and Rash    Has patient had a PCN reaction causing immediate rash, facial/tongue/throat swelling, SOB or lightheadedness with hypotension: Yes Has patient had a PCN reaction causing severe rash involving mucus membranes or skin necrosis: No Has patient had a PCN reaction that required hospitalization No Has patient had a PCN reaction occurring within the last 10 years:  No If all of the above answers are "NO", then may proceed with Cephalosporin use.     ROS Review of Systems  Constitutional:  Negative for chills and fever.  Gastrointestinal:  Negative for abdominal pain.  Genitourinary:  Positive for dysuria and frequency. Negative for flank pain and hematuria.     Objective:    Physical Exam Vitals reviewed.  Constitutional:      General: She is not in acute distress.    Appearance: She is not ill-appearing or toxic-appearing.  Cardiovascular:     Rate and Rhythm: Normal rate and regular rhythm.  Pulmonary:     Effort: Pulmonary effort is normal.     Breath sounds: Normal breath sounds.  Neurological:     Mental Status: She is alert.    BP 124/68 (BP Location: Left Arm, Patient Position: Sitting, Cuff Size: Normal)   Pulse 81   Temp 97.8 F (36.6 C) (Oral)   Wt 224 lb (101.6 kg)   SpO2 98%   BMI 38.45 kg/m  Wt Readings from Last 3 Encounters:  07/04/20 224 lb (101.6 kg)  06/01/20 223 lb (101.2 kg)  03/14/20 225 lb (102.1 kg)     Health Maintenance Due  Topic Date Due   Zoster Vaccines- Shingrix (1 of 2) Never done   COVID-19 Vaccine (4 - Booster for [employer]  series) 12/15/2019   FOOT EXAM  12/30/2019    There are no preventive care reminders to display for this patient.  Lab Results  Component Value Date   TSH 3.76 02/02/2020   Lab Results  Component Value Date   WBC 4.6 02/02/2020   HGB 12.3 02/02/2020   HCT 37.9 02/02/2020   MCV 79.6 02/02/2020   PLT 159.0 02/02/2020   Lab Results  Component Value Date   NA 140 02/23/2020   K 4.1 02/23/2020   CO2 30 02/23/2020   GLUCOSE 109 (H) 02/23/2020   BUN 14 02/23/2020   CREATININE 1.24 (H) 02/23/2020   BILITOT 0.8 02/02/2020   ALKPHOS 83 02/02/2020   AST 21 02/02/2020   ALT 11 02/02/2020   PROT 6.7 02/02/2020   ALBUMIN 4.4 02/02/2020   CALCIUM 9.3 02/23/2020   ANIONGAP 10 07/03/2019   GFR 42.06 (L) 02/23/2020   Lab Results  Component Value Date   CHOL 174  02/02/2020   Lab Results  Component Value Date   HDL 83.10 02/02/2020   Lab Results  Component Value Date   LDLCALC 76 02/02/2020   Lab Results  Component Value Date   TRIG 73.0 02/02/2020   Lab Results  Component Value Date   CHOLHDL 2 02/02/2020   Lab Results  Component Value Date   HGBA1C 6.0 (A) 02/02/2020      Assessment & Plan:   Dysuria of 1 day duration.  Urine dipstick suggest possible UTI  -Urine culture sent -Stay well-hydrated -Start cephalexin 500 mg 3 times daily for 7 days pending culture results -Follow-up promptly for any fever or worsening symptoms   Meds ordered this encounter  Medications   cephALEXin (KEFLEX) 500 MG capsule    Sig: Take 1 capsule (500 mg total) by mouth 3 (three) times daily.    Dispense:  21 capsule    Refill:  0    Follow-up: No follow-ups on file.    Carolann Littler, MD

## 2020-07-06 LAB — URINE CULTURE
MICRO NUMBER:: 12000161
SPECIMEN QUALITY:: ADEQUATE

## 2020-07-06 NOTE — Progress Notes (Signed)
Discussed results with patient, patient expressed understanding. Nothing further needed. ° °

## 2020-07-13 ENCOUNTER — Encounter: Payer: Self-pay | Admitting: Genetic Counselor

## 2020-07-14 ENCOUNTER — Encounter: Payer: Self-pay | Admitting: Genetic Counselor

## 2020-07-14 ENCOUNTER — Other Ambulatory Visit: Payer: Self-pay

## 2020-07-14 ENCOUNTER — Inpatient Hospital Stay: Payer: Medicare Other | Attending: Genetic Counselor | Admitting: Genetic Counselor

## 2020-07-14 ENCOUNTER — Inpatient Hospital Stay: Payer: Medicare Other

## 2020-07-14 DIAGNOSIS — Z8041 Family history of malignant neoplasm of ovary: Secondary | ICD-10-CM

## 2020-07-14 DIAGNOSIS — Z800 Family history of malignant neoplasm of digestive organs: Secondary | ICD-10-CM | POA: Insufficient documentation

## 2020-07-14 DIAGNOSIS — D126 Benign neoplasm of colon, unspecified: Secondary | ICD-10-CM

## 2020-07-14 DIAGNOSIS — K635 Polyp of colon: Secondary | ICD-10-CM | POA: Insufficient documentation

## 2020-07-14 LAB — GENETIC SCREENING ORDER

## 2020-07-14 NOTE — Progress Notes (Signed)
REFERRING PROVIDER: Yetta Flock, MD [address] [place],  [place] [address]  PRIMARY PROVIDER:  Eulas Post, MD  PRIMARY REASON FOR VISIT:  1. Family history of ovarian cancer   2. Adenomatous polyp of colon, unspecified part of colon   3. Family history of pancreatic cancer      HISTORY OF PRESENT ILLNESS:   Ms. Stryker, a 80 y.o. female, was seen for a [facility] cancer genetics consultation at the request of Dr. Havery Moros due to a personal history of polyposis and family history of cancer.  Ms. Storck presents to clinic today to discuss the possibility of a hereditary predisposition to cancer, genetic testing, and to further clarify her future cancer risks, as well as potential cancer risks for family members.   Ms. Howland is a 80 y.o. female with no personal history of cancer.  She was found to have 14 colon polyps on her colonoscopy in 2021 and an additional 10 colon polyps in 2022.  Additionally, Ms, Lax has 5 meningiomas, two of which have been removed.  There were first identified in the early to mid 2000's.  CANCER HISTORY:  Oncology History   No history exists.     RISK FACTORS:  Menarche was at age 36.  First live birth at age 37.  Ovaries intact: no.  Hysterectomy: yes.  Menopausal status: postmenopausal.  HRT use: 0 years. Colonoscopy: yes; abnormal. Mammogram within the last year: yes. Number of breast biopsies: 0. Up to date with pelvic exams: yes. Any excessive radiation exposure in the past: no  Past Medical History:  Diagnosis Date   Allergy    Anxiety    Arthritis    Asthma    patient denies    Cataract    bilateral lens implants    Colon polyps    Depression    Diabetes mellitus    Type two   Diverticulitis    Double vision 2019   left eye   Family history of adverse reaction to anesthesia    daughter- N/V    Family history of ovarian cancer    Family history of pancreatic cancer    Frequency of urination     GERD (gastroesophageal reflux disease)    History of hiatal hernia    Hyperglycemia    Hyperlipidemia    Hypertension    Hyperthyroidism    PT HAD BIPOSY -NEGATIVE.Marland Kitchen AND NOW JUST GETS CHECKED EACH YEAR .Marland Kitchen 2013 LAST TEST   SEES DR. [name].    Multiple meningiomas of spine and brain ([facility])    Obstructive sleep apnea    [facility] DO[facility] NOT USE [facility] PAP   Palpitations    Peptic ulcer disease    Pneumonia    "walking pneumonia" hx of   Seizures ([place]) 2015   one siezure with brain surgery none since 2015    Sleep apnea    no CPAP   Urinary tract infection 03/2020    Past Surgical History:  Procedure Laterality Date   ABDOMINAL HYSTERECTOMY     BACK SURGERY     L SPIN 2003   NECK 2004   BRAIN MENINGIOMA EXCISION  8/10   X 2   C5-C6 neck fusion     COLONOSCOPY  05/05/2009   CRANIOTOMY  08/13/2011   Procedure: CRANIOTOMY TUMOR EXCISION;  Surgeon: [name], MD;  Location: [facility];  Service: Neurosurgery;  Laterality: Bilateral;  Bifrontal craniotomy for tumor   DILATION AND  CURETTAGE OF UTERUS     YEARS AGO   L4-L5 posterior la     left foot plantar and hammertoe     right foot bunectomy     right knee arthroscopy     x 3   right shoulder rotator cuff     ROBOTIC ASSISTED BILATERAL SALPINGO OOPHERECTOMY N/A 08/25/2015   Procedure: XI ROBOTIC ASSISTED BILATERAL SALPINGO OOPHORECTOMY;  Surgeon: [name], MD;  Location: [facility];  Service: Gynecology;  Laterality: N/A;   TONSILLECTOMY     TUBAL LIGATION     WRIST SURGERY Right     Social History   Socioeconomic History   Marital status: Married    Spouse name: Not on file   Number of children: Not on file   Years of education: Not on file   Highest education level: Not on file  Occupational History   Not on file  Tobacco Use   Smoking status: Never   Smokeless tobacco: Never  Vaping Use   Vaping Use: Never used  Substance and Sexual Activity   Alcohol use: No   Drug use: No   Sexual activity: Never   Other Topics Concern   Not on file  Social History Narrative   Not on file   Social Determinants of Health   Financial Resource Strain: Low Risk    Difficulty of Paying Living Expenses: Not hard at all  Food Insecurity: No Food Insecurity   Worried About Charity fundraiser in the Last Year: Never true   [place] in the Last Year: Never true  Transportation Needs: No Transportation Needs   Lack of Transportation (Medical): No   Lack of Transportation (Non-Medical): No  Physical Activity: Sufficiently Active   Days of Exercise per Week: 3 days   Minutes of Exercise per Session: 60 min  Stress: No Stress Concern Present   Feeling of Stress : Not at all  Social Connections: Moderately Integrated   Frequency of Communication with Friends and Family: More than three times a week   Frequency of Social Gatherings with Friends and Family: More than three times a week   Attends Religious Services: More than 4 times per year   Active Member of [employer] or Organizations: No   Attends Archivist Meetings: Never   Marital Status: Married     FAMILY HISTORY:  We obtained a detailed, 4-generation family history.  Significant diagnoses are listed below: Family History  Problem Relation Age of Onset   Ovarian cancer Mother    Heart disease Father 74   Pancreatic cancer Maternal Aunt    Diabetes Maternal Grandmother    Anesthesia problems Daughter        NAUSEA AND VOMITING POST OP   Arthritis Other    Hyperlipidemia Other    Hypertension Other    Diabetes Other    Breast cancer Neg Hx    Colon cancer Neg Hx    Colon polyps Neg Hx    Esophageal cancer Neg Hx    Stomach cancer Neg Hx    Rectal cancer Neg Hx     The patient has six children who are all cancer free.  She is an only child.  Both parents are deceased.  The patient's father died at 66 from hardened arteries.  She reports that he had many nosebleeds as well.  He had up to 15 siblings, one sister had  stomach problems, but no cancer.  His parents are deceased from unknown reasons.  The patient's mother had ovarian cancer dx at age 73.  She had 14 siblings, one sister had pancreatic cancer.  Her parents died of complications from diabetes and her father died in a house fire.  Ms. Koehn is unaware of previous family history of genetic testing for hereditary cancer risks. Patient's maternal ancestors are of African American descent, and paternal ancestors are of African American descent. There is no reported Ashkenazi Jewish ancestry. There is no known consanguinity.  GENETIC COUNSELING ASSESSMENT: Ms. Brockway is a 80 y.o. female with a personal history of polyposis and family history of cancer which is somewhat suggestive of a hereditary cancer syndrome and predisposition to cancer given the number of colon polyps she has the type of cancer in her family. We, therefore, discussed and recommended the following at today's visit.   DISCUSSION: We discussed that up to 20% of ovarian cancer, and about 15% of pancreatic cancer is hereditary, with most cases associated with BRCA mutations.  There are other genes that can be associated with hereditary ovarian cancer syndromes.  These include BRIP1, RAD51C, RAD51D and Lynch syndrome.  We also reviewed polyposis syndromes.  Most commonly these are associated with APC and MUTYH mutations.  However, the history of nosebleeds in her father and his young death, there could be a polyposis syndrome due to SMAD4 mutations and HHT.  Lastly, she has multiple meningiomas. It is unknown if other family members also have these benign tumors.  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  We discussed that testing is beneficial for several reasons including knowing how to follow individuals  and understand if other family members could be at risk for cancer and allow them to undergo genetic testing.   We  reviewed the characteristics, features and inheritance patterns of hereditary cancer syndromes. We also discussed genetic testing, including the appropriate family members to test, the process of testing, insurance coverage and turn-around-time for results. We discussed the implications of a negative, positive, carrier and/or variant of uncertain significant result. We recommended Ms. Mcgillivray pursue genetic testing for the Multi-cancer gene panel+RNA.  The Multi-Gene Panel offered by [facility] includes sequencing and/or deletion duplication testing of the following 85 genes: AIP, ALK, APC, ATM, AXIN2,BAP1,  BARD1, BLM, BMPR1A, BRCA1, BRCA2, BRIP1, CASR, CDC73, CDH1, CDK4, CDKN1B, CDKN1C, CDKN2A (p14ARF), CDKN2A (p16INK4a), CEBPA, CHEK2, CTNNA1, DICER1, DIS3L2, EGFR (c.2369C>T, p.Thr790Met variant only), EPCAM (Deletion/duplication testing only), FH, FLCN, GATA2, GPC3, GREM1 (Promoter region deletion/duplication testing only), HOXB13 (c.251G>A, p.Gly84Glu), HRAS, KIT, MAX, MEN1, MET, MITF (c.952G>A, p.Glu318Lys variant only), MLH1, MSH2, MSH3, MSH6, MUTYH, NBN, NF1, NF2, NTHL1, PALB2, PDGFRA, PHOX2B, PMS2, POLD1, POLE, POT1, PRKAR1A, PTCH1, PTEN, RAD50, RAD51C, RAD51D, RB1, RECQL4, RET, RNF43, RUNX1, SDHAF2, SDHA (sequence changes only), SDHB, SDHC, SDHD, SMAD4, SMARCA4, SMARCB1, SMARCE1, STK11, SUFU, TERC, TERT, TMEM127, TP53, TSC1, TSC2, VHL, WRN and WT1.    Based on Ms. Greenhalgh family history of cancer and personal history of polyposis she meets medical criteria for genetic testing. Despite that she meets criteria, she may still have an out of pocket cost. We discussed that if her out of pocket cost for testing is over $100, the laboratory will call and confirm whether she wants to proceed with testing.  If the out of pocket cost of testing is less than $100 she will be billed by the genetic testing laboratory.   PLAN: After considering the risks, benefits, and limitations, Ms. Buch provided informed consent  to pursue genetic  testing and the blood sample was sent to [facility] for analysis of the Multi-cancer gene panel+RNA. Results should be available within approximately 2-3 weeks' time, at which point they will be disclosed by telephone to Ms. Dittman, as will any additional recommendations warranted by these results. Ms. Dapolito will receive a summary of her genetic counseling visit and a copy of her results once available. This information will also be available in Epic.   Lastly, we encouraged Ms. Agrawal to remain in contact with cancer genetics annually so that we can continuously update the family history and inform her of any changes in cancer genetics and testing that may be of benefit for this family.   Ms. Vorhees questions were answered to her satisfaction today. Our contact information was provided should additional questions or concerns arise. Thank you for the referral and allowing [place] to share in the care of your patient.   Vaidehi Braddy P. Florene Glen, [place], [facility] Licensed, Insurance risk surveyor [email or web address] phone: [phone]  The patient was seen for a total of 30 minutes in face-to-face genetic counseling.  The patient was seen alone.  This patient was discussed with Drs. Magrinat, Lindi Adie and/or Burr Medico who agrees with the above.    _______________________________________________________________________ For Office Staff:  Number of people involved in session: 1 Was an Intern/ student involved with case: no

## 2020-07-15 ENCOUNTER — Other Ambulatory Visit: Payer: Self-pay | Admitting: Family Medicine

## 2020-08-01 ENCOUNTER — Encounter: Payer: Self-pay | Admitting: Genetic Counselor

## 2020-08-01 ENCOUNTER — Telehealth: Payer: Self-pay | Admitting: Genetic Counselor

## 2020-08-01 ENCOUNTER — Encounter: Payer: Self-pay | Admitting: Family Medicine

## 2020-08-01 ENCOUNTER — Other Ambulatory Visit: Payer: Self-pay | Admitting: Family Medicine

## 2020-08-01 DIAGNOSIS — Z1379 Encounter for other screening for genetic and chromosomal anomalies: Secondary | ICD-10-CM | POA: Insufficient documentation

## 2020-08-01 NOTE — Telephone Encounter (Signed)
Revealed negative genetic testing.  Discussed that we do not know why she has polyposis or why there is cancer in the family. It could be due to a different gene that we are not testing, or maybe our current technology may not be able to pick something up.  It will be important for her to keep in contact with genetics to keep up with whether additional testing may be needed.

## 2020-08-02 ENCOUNTER — Encounter: Payer: Self-pay | Admitting: Endocrinology

## 2020-08-02 ENCOUNTER — Ambulatory Visit: Payer: Self-pay | Admitting: Genetic Counselor

## 2020-08-02 ENCOUNTER — Other Ambulatory Visit: Payer: Self-pay

## 2020-08-02 ENCOUNTER — Ambulatory Visit (INDEPENDENT_AMBULATORY_CARE_PROVIDER_SITE_OTHER): Payer: Medicare Other | Admitting: Endocrinology

## 2020-08-02 VITALS — BP 122/78 | HR 74 | Ht 64.0 in | Wt 224.4 lb

## 2020-08-02 DIAGNOSIS — E1169 Type 2 diabetes mellitus with other specified complication: Secondary | ICD-10-CM | POA: Diagnosis not present

## 2020-08-02 DIAGNOSIS — Z1379 Encounter for other screening for genetic and chromosomal anomalies: Secondary | ICD-10-CM

## 2020-08-02 DIAGNOSIS — E669 Obesity, unspecified: Secondary | ICD-10-CM | POA: Diagnosis not present

## 2020-08-02 DIAGNOSIS — E890 Postprocedural hypothyroidism: Secondary | ICD-10-CM | POA: Diagnosis not present

## 2020-08-02 LAB — URINALYSIS, ROUTINE W REFLEX MICROSCOPIC
Bilirubin Urine: NEGATIVE
Hgb urine dipstick: NEGATIVE
Ketones, ur: NEGATIVE
Nitrite: NEGATIVE
RBC / HPF: NONE SEEN (ref 0–?)
Specific Gravity, Urine: 1.01 (ref 1.000–1.030)
Total Protein, Urine: NEGATIVE
Urine Glucose: NEGATIVE
Urobilinogen, UA: 0.2 (ref 0.0–1.0)
pH: 6 (ref 5.0–8.0)

## 2020-08-02 LAB — BASIC METABOLIC PANEL
BUN: 14 mg/dL (ref 6–23)
CO2: 27 mEq/L (ref 19–32)
Calcium: 9.5 mg/dL (ref 8.4–10.5)
Chloride: 102 mEq/L (ref 96–112)
Creatinine, Ser: 1.23 mg/dL — ABNORMAL HIGH (ref 0.40–1.20)
GFR: 42.34 mL/min — ABNORMAL LOW (ref >60.00)
Glucose, Bld: 97 mg/dL (ref 70–99)
Potassium: 4.2 mEq/L (ref 3.5–5.1)
Sodium: 138 mEq/L (ref 135–145)

## 2020-08-02 LAB — POCT GLYCOSYLATED HEMOGLOBIN (HGB A1C): Hemoglobin A1C: 6.3 % — AB (ref 4.0–5.6)

## 2020-08-02 LAB — TSH: TSH: 5.18 u[IU]/mL (ref 0.35–5.50)

## 2020-08-02 LAB — T4, FREE: Free T4: 1.02 ng/dL (ref 0.60–1.60)

## 2020-08-02 NOTE — Progress Notes (Signed)
Patient ID: Dawn Salas, female   DOB: [birth year], 80 y.o.   MRN: [id number]                                                                                                                  Chief complaint: Endocrinology follow-up   History of Present Illness:   History obtained on initial consultation:  She was diagnosed as having Hyperthyroidism in 2015 when she was having palpitations and was seen to have a goiter. At that time she was having the usual symptoms of fatigue, shakiness, palpitations, heat intolerance and some weight loss No details of this are available She was treated with I-131, unknown dose which was prescribed in March 2015 Apparently she had a large hot nodule on the left, however details of her treatment are not available  Apparently subsequently her hyperthyroidism recurred and methimazole restarted ?  In 4/17 Also the dose was increased up to 7.5 mg in 10/2015  Apparently prior to her being treated for the hot nodule she had a multinodular goiter evaluated with a fine-needle aspiration in 09/2010  Since she was significantly hyperthyroid on her initial exam in February and free T3 was 5.0 and was treated with methimazole Because of persistent hyperthyroidism and very high thyrotropin receptor antibody she was given I-131 treatment with 20.3 mCi on 06/04/16  RECENT history:  She has been HYPOTHYROID as of 07/2016 with a low free T4 level  She was also having some fatigue and cold sensitivity as well as the muscle cramps With starting levothyroxine 125 g she felt better, however subsequently TSH was low again the dose was reduced aggressively  She is taking 75g of levothyroxine daily since 6/20  She feels fatigued since 2/22  Sometimes will feel warm or sweaty and this is followed by a feeling of coldness She has taken her levothyroxine before breakfast daily and has not missed any doses   She takes her Geritol with iron after breakfast which is at  least 30 minutes later  Wt Readings from Last 3 Encounters:  08/02/20 224 lb 6.4 oz (101.8 kg)  07/04/20 224 lb (101.6 kg)  06/01/20 223 lb (101.2 kg)     Thyroid function tests as follows:     Lab Results  Component Value Date   TSH 3.76 02/02/2020   TSH 1.93 07/29/2019   TSH 1.06 01/26/2019   FREET4 1.03 02/02/2020   FREET4 1.11 07/29/2019   FREET4 1.27 01/26/2019   Ophthalmopathy:  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 with wearing prism lenses Has some tearing of the eyes and has been given drops by the ophthalmologist She is followed by ophthalmologist regularly Previous MRI had shown mild thickening of the rectus muscle on the left in 2018  DIABETES: See review of systems    Allergies as of 08/02/2020       Reactions   Flagyl [metronidazole] Other (See Comments)   Caused increased heart rate   Aspirin Effervescent Swelling   Alka-Seltzer gel caps- sore mouth, face swollen,  turned hands white   Morphine Sulfate Hives   Hallucinations   Penicillins Swelling, Rash   Has patient had a PCN reaction causing immediate rash, facial/tongue/throat swelling, SOB or lightheadedness with hypotension: Yes Has patient had a PCN reaction causing severe rash involving mucus membranes or skin necrosis: No Has patient had a PCN reaction that required hospitalization No Has patient had a PCN reaction occurring within the last 10 years: No If all of the above answers are "NO", then may proceed with Cephalosporin use.        Medication List        Accurate as of August 02, 2020 11:12 AM. If you have any questions, ask your nurse or doctor.          Accu-Chek Aviva Plus test strip Generic drug: glucose blood TEST BLOOD SUGAR TWICE DAILY.   accu-chek softclix lancets 1 each by Other route 2 (two) times daily. E11.9   Accu-Chek Softclix Lancets lancets TEST BLOOD SUGAR TWICE DAILY.   acetaminophen 650 MG CR tablet Commonly known as: TYLENOL Take 1,300 mg  by mouth every 8 (eight) hours as needed for pain.   amLODipine 5 MG tablet Commonly known as: NORVASC TAKE ONE (1) TABLET EACH DAY   ascorbic acid 500 MG tablet Commonly known as: VITAMIN C Take 500 mg by mouth daily.   betamethasone dipropionate 0.05 % cream APPLY TO LEGS AS DIRECTED   cephALEXin 500 MG capsule Commonly known as: KEFLEX Take 1 capsule (500 mg total) by mouth 3 (three) times daily.   cetirizine 10 MG tablet Commonly known as: ZYRTEC Take 10 mg by mouth daily as needed for allergies.   furosemide 40 MG tablet Commonly known as: LASIX Take 0.5 tablets (20 mg total) by mouth daily as needed. For fluid   GERITOL COMPLETE PO Take 1 tablet by mouth daily.   Janumet XR [phone] MG Tb24 Generic drug: SitaGLIPtin-MetFORMIN HCl TAKE 1 TABLET DAILY AT LUNCH   levothyroxine 75 MCG tablet Commonly known as: SYNTHROID Take 1 tablet (75 mcg total) by mouth daily before breakfast.   losartan-hydrochlorothiazide 100-12.5 MG tablet Commonly known as: HYZAAR Take 1 tablet by mouth daily. What changed: how much to take   Magnesium 250 MG Tabs Take 250 mg by mouth daily.   meclizine 25 MG tablet Commonly known as: ANTIVERT Take 25 mg by mouth 3 (three) times daily as needed for dizziness.   metoprolol succinate 100 MG 24 hr tablet Commonly known as: TOPROL-XL TAKE ONE (1) TABLET EACH DAY   OVER THE COUNTER MEDICATION Apply 1 application topically daily as needed (pain). Oxyrub otc pain relieving gel   potassium chloride 10 MEQ tablet Commonly known as: KLOR-CON TAKE ONE (1) TABLET EACH DAY   RABEprazole 20 MG tablet Commonly known as: ACIPHEX TAKE ONE (1) TABLET EACH DAY   rosuvastatin 40 MG tablet Commonly known as: CRESTOR TAKE ONE (1) TABLET EACH DAY   solifenacin 5 MG tablet Commonly known as: VESICARE TAKE ONE (1) TABLET EACH DAY What changed: See the new instructions.   Systane Balance 0.6 % Soln Generic drug: Propylene Glycol Place 1 drop  into both eyes daily.   valACYclovir 1000 MG tablet Commonly known as: VALTREX TAKE 2 TABS TWICE DAILY FOR 1 DAY THEN AS NEEDED   VITAMIN B12 PO Take 1 mL by mouth daily. OTC            Past Medical History:  Diagnosis Date   Allergy    Anxiety  Arthritis    Asthma    patient denies    Cataract    bilateral lens implants    Colon polyps    Depression    Diabetes mellitus    Type two   Diverticulitis    Double vision 2019   left eye   Family history of adverse reaction to anesthesia    daughter- N/V    Family history of ovarian cancer    Family history of pancreatic cancer    Frequency of urination    GERD (gastroesophageal reflux disease)    History of hiatal hernia    Hyperglycemia    Hyperlipidemia    Hypertension    Hyperthyroidism    PT HAD BIPOSY -NEGATIVE.Marland Kitchen AND NOW JUST GETS CHECKED EACH YEAR .Marland Kitchen 2013 LAST TEST   SEES DR. [name].    Multiple meningiomas of spine and brain ([place])    Obstructive sleep apnea    [facility]Y [facility] DOES NOT USE [facility] PAP   Palpitations    Peptic ulcer disease    Pneumonia    "walking pneumonia" hx of   Seizures ([place]) 2015   one siezure with brain surgery none since 2015    Sleep apnea    no CPAP   Urinary tract infection 03/2020    Past Surgical History:  Procedure Laterality Date   ABDOMINAL HYSTERECTOMY     BACK SURGERY     L SPIN 2003   NECK 2004   BRAIN MENINGIOMA EXCISION  8/10   X 2   C5-C6 neck fusion     COLONOSCOPY  05/05/2009   CRANIOTOMY  08/13/2011   Procedure: CRANIOTOMY TUMOR EXCISION;  Surgeon: [name], MD;  Location: [facility];  Service: Neurosurgery;  Laterality: Bilateral;  Bifrontal craniotomy for tumor   DILATION AND CURETTAGE OF UTERUS     YEARS AGO   L4-L5 posterior la     left foot plantar and hammertoe     right foot bunectomy     right knee arthroscopy     x 3   right shoulder rotator cuff     ROBOTIC ASSISTED BILATERAL SALPINGO OOPHERECTOMY N/A 08/25/2015   Procedure: XI ROBOTIC  ASSISTED BILATERAL SALPINGO OOPHORECTOMY;  Surgeon: [name], MD;  Location: [facility];  Service: Gynecology;  Laterality: N/A;   TONSILLECTOMY     TUBAL LIGATION     WRIST SURGERY Right     Family History  Problem Relation Age of Onset   Ovarian cancer Mother    Heart disease Father 22   Pancreatic cancer Maternal Aunt    Diabetes Maternal Grandmother    Anesthesia problems Daughter        NAUSEA AND VOMITING POST OP   Arthritis Other    Hyperlipidemia Other    Hypertension Other    Diabetes Other    Breast cancer Neg Hx    Colon cancer Neg Hx    Colon polyps Neg Hx    Esophageal cancer Neg Hx    Stomach cancer Neg Hx    Rectal cancer Neg Hx     Social History:  reports that she has never smoked. She has never used smokeless tobacco. She reports that she does not drink alcohol and does not use drugs.  Allergies:  Allergies  Allergen Reactions   Flagyl [Metronidazole] Other (See Comments)    Caused increased heart rate   Aspirin Effervescent Swelling    Alka-Seltzer gel caps- sore mouth, face swollen, turned hands white  Morphine Sulfate Hives    Hallucinations   Penicillins Swelling and Rash    Has patient had a PCN reaction causing immediate rash, facial/tongue/throat swelling, SOB or lightheadedness with hypotension: Yes Has patient had a PCN reaction causing severe rash involving mucus membranes or skin necrosis: No Has patient had a PCN reaction that required hospitalization No Has patient had a PCN reaction occurring within the last 10 years: No If all of the above answers are "NO", then may proceed with Cephalosporin use.       Review of Systems    DIABETES   She has had longstanding diabetes, mostly treated with metformin  Since she had relatively higher A1c of 7% and increasing blood sugars she was switched from metformin to Janumet in 6/21 She was also feeling shaky sometimes when taking metformin in the morning She has not been seen by  dietitian  Currently trying to take the [facility] in the afternoon since she thinks that taking it earlier makes her sugar get lower  She has difficulty losing weight and only this week has started walking a little Mostly checking blood sugars in the mornings, AVERAGE 106 Nonfasting readings near normal, highest 120 MEDIAN overall 108 She is concerned about the cost of Janumet  Previous readings  PRE-MEAL Fasting Lunch Dinner Bedtime Overall  Glucose range:  117-133   113    Mean/median:      135   POST-MEAL PC Breakfast PC Lunch PC Dinner  Glucose range:  194  136   Mean/median:      Wt Readings from Last 3 Encounters:  08/02/20 224 lb 6.4 oz (101.8 kg)  07/04/20 224 lb (101.6 kg)  06/01/20 223 lb (101.2 kg)      Lab Results  Component Value Date   HGBA1C 6.0 (A) 02/02/2020   HGBA1C 6.6 (A) 11/02/2019   HGBA1C 7.0 (H) 06/29/2019   Lab Results  Component Value Date   MICROALBUR <0.7 02/02/2020   LDLCALC 76 02/02/2020   CREATININE 1.24 (H) 02/23/2020   HYPERTENSION: Followed by PCP with good control    BP Readings from Last 3 Encounters:  08/02/20 122/78  07/04/20 124/68  06/15/20 (!) 120/93       Examination:   BP 122/78   Pulse 74   Ht 5\' 4"  (1.626 m)   Wt 224 lb 6.4 oz (101.8 kg)   SpO2 99%   BMI 38.52 kg/m      Assessment/Plan:  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-131  Her thyroxine requirement previously has been stable at 75 mcg levothyroxine daily Now complaining of fatigue more recently   Will determine her thyroid levels from labs today and decide on dosage  Graves' ophthalmopathy: Stable now  DIABETES with obesity:  Although blood sugars are well controlled with Janumet compared to metformin she does need better weight loss Also may benefit from SGLT2 or GLP-1 drug because of difficulty with her losing weight and long-term cardiovascular benefit Currently not a candidate for SGLT2 since she is recovering from a  UTI and had another 1 last year  Will have her check her blood sugars more consistently after meals and if having significantly high postprandial readings will consider Rybelsus Her main consideration will be higher cost  Follow-up urinalysis to be done since not clear if her recent E. coli infection was sensitive to cephalexin  Follow-up to be decided     There are no Patient Instructions on file for this visit.  Elayne Snare 08/02/2020, 11:12  AM   Addendum: TSH 5.2, she will take extra half tablet weekly of her levothyroxine, follow-up as scheduled

## 2020-08-02 NOTE — Patient Instructions (Addendum)
Check blood sugars on waking up 2 days a week  Also check blood sugars about 2 hours after meals and do this after different meals by rotation  Recommended blood sugar levels on waking up are 90-130 and about 2 hours after meal is 130-160  Please bring your blood sugar monitor to each visit, thank you   

## 2020-08-02 NOTE — Progress Notes (Signed)
HPI:  Ms. Stennett was previously seen in the [place] clinic due to a personal history of polyposis and family history of cancer and concerns regarding a hereditary predisposition to cancer. Please refer to our prior cancer genetics clinic note for more information regarding our discussion, assessment and recommendations, at the time. Ms. Weyand recent genetic test results were disclosed to her, as were recommendations warranted by these results. These results and recommendations are discussed in more detail below.  CANCER HISTORY:  Oncology History   No history exists.    FAMILY HISTORY:  We obtained a detailed, 4-generation family history.  Significant diagnoses are listed below: Family History  Problem Relation Age of Onset   Ovarian cancer Mother    Heart disease Father 66   Pancreatic cancer Maternal Aunt    Diabetes Maternal Grandmother    Anesthesia problems Daughter        NAUSEA AND VOMITING POST OP   Arthritis Other    Hyperlipidemia Other    Hypertension Other    Diabetes Other    Breast cancer Neg Hx    Colon cancer Neg Hx    Colon polyps Neg Hx    Esophageal cancer Neg Hx    Stomach cancer Neg Hx    Rectal cancer Neg Hx     The patient has six children who are all cancer free.  She is an only child.  Both parents are deceased.   The patient's father died at 83 from hardened arteries.  She reports that he had many nosebleeds as well.  He had up to 15 siblings, one sister had stomach problems, but no cancer.  His parents are deceased from unknown reasons.   The patient's mother had ovarian cancer dx at age 77.  She had 14 siblings, one sister had pancreatic cancer.  Her parents died of complications from diabetes and her father died in a house fire.   Ms. Korte is unaware of previous family history of genetic testing for hereditary cancer risks. Patient's maternal ancestors are of African American descent, and paternal ancestors are of African  American descent. There is no reported Ashkenazi Jewish ancestry. There is no known consanguinity.  GENETIC TEST RESULTS: Genetic testing reported out on July 30, 2020 through the Multi-cancer panel + RNA found no pathogenic mutations. The Multi-Gene Panel offered by [facility] includes sequencing and/or deletion duplication testing of the following 84 genes: AIP, ALK, APC, ATM, AXIN2,BAP1,  BARD1, BLM, BMPR1A, BRCA1, BRCA2, BRIP1, CASR, CDC73, CDH1, CDK4, CDKN1B, CDKN1C, CDKN2A (p14ARF), CDKN2A (p16INK4a), CEBPA, CHEK2, CTNNA1, DICER1, DIS3L2, EGFR (c.2369C>T, p.Thr790Met variant only), EPCAM (Deletion/duplication testing only), FH, FLCN, GATA2, GPC3, GREM1 (Promoter region deletion/duplication testing only), HOXB13 (c.251G>A, p.Gly84Glu), HRAS, KIT, MAX, MEN1, MET, MITF (c.952G>A, p.Glu318Lys variant only), MLH1, MSH2, MSH3, MSH6, MUTYH, NBN, NF1, NF2, NTHL1, PALB2, PDGFRA, PHOX2B, PMS2, POLD1, POLE, POT1, PRKAR1A, PTCH1, PTEN, RAD50, RAD51C, RAD51D, RB1, RECQL4, RET, RUNX1, SDHAF2, SDHA (sequence changes only), SDHB, SDHC, SDHD, SMAD4, SMARCA4, SMARCB1, SMARCE1, STK11, SUFU, TERC, TERT, TMEM127, TP53, TSC1, TSC2, VHL, WRN and WT1. The test report has been scanned into EPIC and is located under the Molecular Pathology section of the Results Review tab.  A portion of the result report is included below for reference.     We discussed with Ms. Macy that because current genetic testing is not perfect, it is possible there may be a gene mutation in one of these genes that current testing cannot detect, but that chance is small.  We  also discussed, that there could be another gene that has not yet been discovered, or that we have not yet tested, that is responsible for the cancer diagnoses in the family. It is also possible there is a hereditary cause for the cancer in the family that Ms. Onstad did not inherit and therefore was not identified in her testing.  Therefore, it is important to remain in touch with  cancer genetics in the future so that we can continue to offer Ms. Xie the most up to date genetic testing.   ADDITIONAL GENETIC TESTING: We discussed with Ms. Ulloa that her genetic testing was fairly extensive.  If there are genes identified to increase cancer risk that can be analyzed in the future, we would be happy to discuss and coordinate this testing at that time.    CANCER SCREENING RECOMMENDATIONS: Ms. Brunet test result is considered negative (normal).  This means that we have not identified a hereditary cause for her personal history of polyposis or family history of cancer at this time. Most cancers happen by chance and this negative test suggests that her cancer may fall into this category.    While reassuring, this does not definitively rule out a hereditary predisposition to cancer. It is still possible that there could be genetic mutations that are undetectable by current technology. There could be genetic mutations in genes that have not been tested or identified to increase cancer risk.  Therefore, it is recommended she continue to follow the cancer management and screening guidelines provided by her oncology and primary healthcare provider.   An individual's cancer risk and medical management are not determined by genetic test results alone. Overall cancer risk assessment incorporates additional factors, including personal medical history, family history, and any available genetic information that may result in a personalized plan for cancer prevention and surveillance  RECOMMENDATIONS FOR FAMILY MEMBERS:  Individuals in this family might be at some increased risk of developing cancer, over the general population risk, simply due to the family history of cancer.  We recommended women in this family have a yearly mammogram beginning at age 66, or 25 years younger than the earliest onset of cancer, an annual clinical breast exam, and perform monthly breast self-exams. Women in this  family should also have a gynecological exam as recommended by their primary provider. All family members should be referred for colonoscopy starting at age 59.  FOLLOW-UP: Lastly, we discussed with Ms. Alfonzo that cancer genetics is a rapidly advancing field and it is possible that new genetic tests will be appropriate for her and/or her family members in the future. We encouraged her to remain in contact with cancer genetics on an annual basis so we can update her personal and family histories and let her know of advances in cancer genetics that may benefit this family.   Our contact number was provided. Ms. [place] questions were answered to her satisfaction, and she knows she is welcome to call us at anytime with additional questions or concerns.   Roma Kayser, [place], [facility] Licensed, Certified Genetic Counselor [email or web address]

## 2020-08-06 ENCOUNTER — Other Ambulatory Visit: Payer: Self-pay | Admitting: Family Medicine

## 2020-08-10 ENCOUNTER — Other Ambulatory Visit: Payer: Self-pay | Admitting: Family Medicine

## 2020-08-10 DIAGNOSIS — Z1231 Encounter for screening mammogram for malignant neoplasm of breast: Secondary | ICD-10-CM

## 2020-08-20 ENCOUNTER — Other Ambulatory Visit: Payer: Self-pay | Admitting: Family Medicine

## 2020-09-02 ENCOUNTER — Encounter: Payer: Self-pay | Admitting: Endocrinology

## 2020-09-23 ENCOUNTER — Ambulatory Visit (INDEPENDENT_AMBULATORY_CARE_PROVIDER_SITE_OTHER): Payer: Medicare Other

## 2020-09-23 DIAGNOSIS — Z0000 Encounter for general adult medical examination without abnormal findings: Secondary | ICD-10-CM | POA: Diagnosis not present

## 2020-09-23 NOTE — Patient Instructions (Signed)
Dawn Salas , Thank you for taking time to come for your Medicare Wellness Visit. I appreciate your ongoing commitment to your health goals. Please review the following plan we discussed and let me know if I can assist you in the future.   Screening recommendations/referrals: Colonoscopy: no longer required  Mammogram: no longer required  Bone Density: 10/14/2019 Recommended yearly ophthalmology/optometry visit for glaucoma screening and checkup Recommended yearly dental visit for hygiene and checkup  Vaccinations: Influenza vaccine: due in fall 2022 Pneumococcal vaccine: completed series  Tdap vaccine: 06/30/2013 Shingles vaccine: will consider     Advanced directives: none   Conditions/risks identified: none   Next appointment: CPE 11/01/2020  0845am Dr.Burchette    Preventive Care 78 Years and Older, Female Preventive care refers to lifestyle choices and visits with your health care provider that can promote health and wellness. What does preventive care include? A yearly physical exam. This is also called an annual well check. Dental exams once or twice a year. Routine eye exams. Ask your health care provider how often you should have your eyes checked. Personal lifestyle choices, including: Daily care of your teeth and gums. Regular physical activity. Eating a healthy diet. Avoiding tobacco and drug use. Limiting alcohol use. Practicing safe sex. Taking low-dose aspirin every day. Taking vitamin and mineral supplements as recommended by your health care provider. What happens during an annual well check? The services and screenings done by your health care provider during your annual well check will depend on your age, overall health, lifestyle risk factors, and family history of disease. Counseling  Your health care provider may ask you questions about your: Alcohol use. Tobacco use. Drug use. Emotional well-being. Home and relationship well-being. Sexual  activity. Eating habits. History of falls. Memory and ability to understand (cognition). Work and work Statistician. Reproductive health. Screening  You may have the following tests or measurements: Height, weight, and BMI. Blood pressure. Lipid and cholesterol levels. These may be checked every 5 years, or more frequently if you are over 45 years old. Skin check. Lung cancer screening. You may have this screening every year starting at age 56 if you have a 30-pack-year history of smoking and currently smoke or have quit within the past 15 years. Fecal occult blood test (FOBT) of the stool. You may have this test every year starting at age 72. Flexible sigmoidoscopy or colonoscopy. You may have a sigmoidoscopy every 5 years or a colonoscopy every 10 years starting at age 34. Hepatitis C blood test. Hepatitis B blood test. Sexually transmitted disease (STD) testing. Diabetes screening. This is done by checking your blood sugar (glucose) after you have not eaten for a while (fasting). You may have this done every 1-3 years. Bone density scan. This is done to screen for osteoporosis. You may have this done starting at age 49. Mammogram. This may be done every 1-2 years. Talk to your health care provider about how often you should have regular mammograms. Talk with your health care provider about your test results, treatment options, and if necessary, the need for more tests. Vaccines  Your health care provider may recommend certain vaccines, such as: Influenza vaccine. This is recommended every year. Tetanus, diphtheria, and acellular pertussis (Tdap, Td) vaccine. You may need a Td booster every 10 years. Zoster vaccine. You may need this after age 49. Pneumococcal 13-valent conjugate (PCV13) vaccine. One dose is recommended after age 93. Pneumococcal polysaccharide (PPSV23) vaccine. One dose is recommended after age 89. Talk to your  health care provider about which screenings and vaccines  you need and how often you need them. This information is not intended to replace advice given to you by your health care provider. Make sure you discuss any questions you have with your health care provider. Document Released: 02/04/2015 Document Revised: 09/28/2015 Document Reviewed: 11/09/2014 Elsevier Interactive Patient Education  2017 [place] Prevention in the Home Falls can cause injuries. They can happen to people of all ages. There are many things you can do to make your home safe and to help prevent falls. What can I do on the outside of my home? Regularly fix the edges of walkways and driveways and fix any cracks. Remove anything that might make you trip as you walk through a door, such as a raised step or threshold. Trim any bushes or trees on the path to your home. Use bright outdoor lighting. Clear any walking paths of anything that might make someone trip, such as rocks or tools. Regularly check to see if handrails are loose or broken. Make sure that both sides of any steps have handrails. Any raised decks and porches should have guardrails on the edges. Have any leaves, snow, or ice cleared regularly. Use sand or salt on walking paths during winter. Clean up any spills in your garage right away. This includes oil or grease spills. What can I do in the bathroom? Use night lights. Install grab bars by the toilet and in the tub and shower. Do not use towel bars as grab bars. Use non-skid mats or decals in the tub or shower. If you need to sit down in the shower, use a plastic, non-slip stool. Keep the floor dry. Clean up any water that spills on the floor as soon as it happens. Remove soap buildup in the tub or shower regularly. Attach bath mats securely with double-sided non-slip rug tape. Do not have throw rugs and other things on the floor that can make you trip. What can I do in the bedroom? Use night lights. Make sure that you have a light by your bed that  is easy to reach. Do not use any sheets or blankets that are too big for your bed. They should not hang down onto the floor. Have a firm chair that has side arms. You can use this for support while you get dressed. Do not have throw rugs and other things on the floor that can make you trip. What can I do in the kitchen? Clean up any spills right away. Avoid walking on wet floors. Keep items that you use a lot in easy-to-reach places. If you need to reach something above you, use a strong step stool that has a grab bar. Keep electrical cords out of the way. Do not use floor polish or wax that makes floors slippery. If you must use wax, use non-skid floor wax. Do not have throw rugs and other things on the floor that can make you trip. What can I do with my stairs? Do not leave any items on the stairs. Make sure that there are handrails on both sides of the stairs and use them. Fix handrails that are broken or loose. Make sure that handrails are as long as the stairways. Check any carpeting to make sure that it is firmly attached to the stairs. Fix any carpet that is loose or worn. Avoid having throw rugs at the top or bottom of the stairs. If you do have throw rugs, attach them  to the floor with carpet tape. Make sure that you have a light switch at the top of the stairs and the bottom of the stairs. If you do not have them, ask someone to add them for you. What else can I do to help prevent falls? Wear shoes that: Do not have high heels. Have rubber bottoms. Are comfortable and fit you well. Are closed at the toe. Do not wear sandals. If you use a stepladder: Make sure that it is fully opened. Do not climb a closed stepladder. Make sure that both sides of the stepladder are locked into place. Ask someone to hold it for you, if possible. Clearly mark and make sure that you can see: Any grab bars or handrails. First and last steps. Where the edge of each step is. Use tools that help you  move around (mobility aids) if they are needed. These include: Canes. Walkers. Scooters. Crutches. Turn on the lights when you go into a dark area. Replace any light bulbs as soon as they burn out. Set up your furniture so you have a clear path. Avoid moving your furniture around. If any of your floors are uneven, fix them. If there are any pets around you, be aware of where they are. Review your medicines with your doctor. Some medicines can make you feel dizzy. This can increase your chance of falling. Ask your doctor what other things that you can do to help prevent falls. This information is not intended to replace advice given to you by your health care provider. Make sure you discuss any questions you have with your health care provider. Document Released: 11/04/2008 Document Revised: 06/16/2015 Document Reviewed: 02/12/2014 Elsevier Interactive Patient Education  2017 [employer].

## 2020-09-23 NOTE — Progress Notes (Signed)
Subjective:   Dawn Salas is a 80 y.o. female who presents for Medicare Annual (Subsequent) preventive examination.   I connected with Marjory Sneddon today by telephone and verified that I am speaking with the correct person using two identifiers. Location patient: home Location provider: work Persons participating in the virtual visit: patient, provider.   I discussed the limitations, risks, security and privacy concerns of performing an evaluation and management service by telephone and the availability of in person appointments. I also discussed with the patient that there may be a patient responsible charge related to this service. The patient expressed understanding and verbally consented to this telephonic visit.    Interactive audio and video telecommunications were attempted between this provider and patient, however failed, due to patient having technical difficulties OR patient did not have access to video capability.  We continued and completed visit with audio only.    Review of Systems    N/a       Objective:    There were no vitals filed for this visit. There is no height or weight on file to calculate BMI.  Advanced Directives 12/28/2019 09/21/2019 07/02/2019 06/29/2019 08/25/2015 08/23/2015 04/11/2015  Does Patient Have a Medical Advance Directive? No No No No No No Yes  Type of Advance Directive - - - - - - Press photographer;Living will  Does patient want to make changes to medical advance directive? - - - - - - No - Patient declined  Would patient like information on creating a medical advance directive? - No - Patient declined No - Patient declined No - Patient declined No - patient declined information No - patient declined information -  Pre-existing out of facility DNR order (yellow form or pink MOST form) - - - - - - -    Current Medications (verified) Outpatient Encounter Medications as of 09/23/2020  Medication Sig   ACCU-CHEK AVIVA PLUS test strip TEST  BLOOD SUGAR TWICE DAILY.   Accu-Chek Softclix Lancets lancets TEST BLOOD SUGAR TWICE DAILY.   acetaminophen (TYLENOL) 650 MG CR tablet Take 1,300 mg by mouth every 8 (eight) hours as needed for pain.   amLODipine (NORVASC) 5 MG tablet TAKE ONE (1) TABLET EACH DAY   ascorbic acid (VITAMIN C) 500 MG tablet Take 500 mg by mouth daily.   betamethasone dipropionate 0.05 % cream APPLY TO LEGS AS DIRECTED   cephALEXin (KEFLEX) 500 MG capsule Take 1 capsule (500 mg total) by mouth 3 (three) times daily. (Patient not taking: Reported on 08/02/2020)   cetirizine (ZYRTEC) 10 MG tablet Take 10 mg by mouth daily as needed for allergies.    Cyanocobalamin (VITAMIN B12 PO) Take 1 mL by mouth daily. OTC   furosemide (LASIX) 40 MG tablet Take 0.5 tablets (20 mg total) by mouth daily as needed. For fluid   Iron-Vitamins (GERITOL COMPLETE PO) Take 1 tablet by mouth daily.   JANUMET XR [id number] MG TB24 TAKE 1 TABLET DAILY AT LUNCH   Lancet Devices (ACCU-CHEK SOFTCLIX) lancets 1 each by Other route 2 (two) times daily. E11.9   levothyroxine (SYNTHROID) 75 MCG tablet Take 1 tablet (75 mcg total) by mouth daily before breakfast.   losartan-hydrochlorothiazide (HYZAAR) 100-12.5 MG tablet Take 1 tablet by mouth daily. (Patient taking differently: Take 0.5 tablets by mouth daily.)   Magnesium 250 MG TABS Take 250 mg by mouth daily.   meclizine (ANTIVERT) 25 MG tablet Take 25 mg by mouth 3 (three) times daily as needed for dizziness.  metoprolol succinate (TOPROL-XL) 100 MG 24 hr tablet TAKE ONE (1) TABLET EACH DAY   OVER THE COUNTER MEDICATION Apply 1 application topically daily as needed (pain). Oxyrub otc pain relieving gel   potassium chloride (KLOR-CON) 10 MEQ tablet TAKE ONE (1) TABLET EACH DAY   Propylene Glycol (SYSTANE BALANCE) 0.6 % SOLN Place 1 drop into both eyes daily.   RABEprazole (ACIPHEX) 20 MG tablet TAKE ONE (1) TABLET EACH DAY   rosuvastatin (CRESTOR) 40 MG tablet TAKE ONE (1) TABLET EACH DAY    solifenacin (VESICARE) 5 MG tablet TAKE ONE (1) TABLET EACH DAY (Patient taking differently: Take 5 mg by mouth daily.)   valACYclovir (VALTREX) 1000 MG tablet TAKE 2 TABS TWICE DAILY FOR 1 DAY THEN AS NEEDED   [DISCONTINUED] venlafaxine (EFFEXOR XR) 37.5 MG 24 hr capsule Take 1 capsule (37.5 mg total) by mouth daily.   No facility-administered encounter medications on file as of 09/23/2020.    Allergies (verified) Flagyl [metronidazole], Aspirin effervescent, Morphine sulfate, and Penicillins   History: Past Medical History:  Diagnosis Date   Allergy    Anxiety    Arthritis    Asthma    patient denies    Cataract    bilateral lens implants    Colon polyps    Depression    Diabetes mellitus    Type two   Diverticulitis    Double vision 2019   left eye   Family history of adverse reaction to anesthesia    daughter- N/V    Family history of ovarian cancer    Family history of pancreatic cancer    Frequency of urination    GERD (gastroesophageal reflux disease)    History of hiatal hernia    Hyperglycemia    Hyperlipidemia    Hypertension    Hyperthyroidism    PT HAD BIPOSY -NEGATIVE.Marland Kitchen AND NOW JUST GETS CHECKED EACH YEAR .Marland Kitchen 2013 LAST TEST   SEES DR. [name].    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 DOES NOT USE C PAP   Palpitations    Peptic ulcer disease    Pneumonia    "walking pneumonia" hx of   Seizures ([place]) 2015   one siezure with brain surgery none since 2015    Sleep apnea    no CPAP   Urinary tract infection 03/2020   Past Surgical History:  Procedure Laterality Date   ABDOMINAL HYSTERECTOMY     BACK SURGERY     L SPIN 2003   NECK 2004   BRAIN MENINGIOMA EXCISION  8/10   X 2   C5-C6 neck fusion     COLONOSCOPY  05/05/2009   CRANIOTOMY  08/13/2011   Procedure: CRANIOTOMY TUMOR EXCISION;  Surgeon: [name], MD;  Location: [facility];  Service: Neurosurgery;  Laterality: Bilateral;  Bifrontal  craniotomy for tumor   DILATION AND CURETTAGE OF UTERUS     YEARS AGO   L4-L5 posterior la     left foot plantar and hammertoe     right foot bunectomy     right knee arthroscopy     x 3   right shoulder rotator cuff     ROBOTIC ASSISTED BILATERAL SALPINGO OOPHERECTOMY N/A 08/25/2015   Procedure: XI ROBOTIC ASSISTED BILATERAL SALPINGO OOPHORECTOMY;  Surgeon: [name], MD;  Location: [facility];  Service: Gynecology;  Laterality: N/A;   TONSILLECTOMY     TUBAL LIGATION  WRIST SURGERY Right    Family History  Problem Relation Age of Onset   Ovarian cancer Mother    Heart disease Father 34   Pancreatic cancer Maternal Aunt    Diabetes Maternal Grandmother    Anesthesia problems Daughter        NAUSEA AND VOMITING POST OP   Arthritis Other    Hyperlipidemia Other    Hypertension Other    Diabetes Other    Breast cancer Neg Hx    Colon cancer Neg Hx    Colon polyps Neg Hx    Esophageal cancer Neg Hx    Stomach cancer Neg Hx    Rectal cancer Neg Hx    Social History   Socioeconomic History   Marital status: Married    Spouse name: Not on file   Number of children: Not on file   Years of education: Not on file   Highest education level: Not on file  Occupational History   Not on file  Tobacco Use   Smoking status: Never   Smokeless tobacco: Never  Vaping Use   Vaping Use: Never used  Substance and Sexual Activity   Alcohol use: No   Drug use: No   Sexual activity: Never  Other Topics Concern   Not on file  Social History Narrative   Not on file   Social Determinants of Health   Financial Resource Strain: Not on file  Food Insecurity: Not on file  Transportation Needs: Not on file  Physical Activity: Not on file  Stress: Not on file  Social Connections: Not on file    Tobacco Counseling Counseling given: Not Answered   Clinical Intake:                 Diabetic?yes  Nutrition Risk Assessment:  Has the patient had any N/V/D within the  last 2 months?  No  Does the patient have any non-healing wounds?  No  Has the patient had any unintentional weight loss or weight gain?  No   Diabetes:  Is the patient diabetic?  Yes  If diabetic, was a CBG obtained today?  No  Did the patient bring in their glucometer from home?  No  How often do you monitor your CBG's? 2 x day .   Financial Strains and Diabetes Management:  Are you having any financial strains with the device, your supplies or your medication? No .  Does the patient want to be seen by Chronic Care Management for management of their diabetes?  No  Would the patient like to be referred to a Nutritionist or for Diabetic Management?  No   Diabetic Exams:  Diabetic Eye Exam: Completed 07/2020 Diabetic Foot Exam: Overdue, Pt has been advised about the importance in completing this exam. Pt is scheduled for diabetic foot exam on next office visit .          Activities of Daily Living No flowsheet data found.  Patient Care Team: Eulas Post, MD as PCP - General  Indicate any recent Medical Services you may have received from other than [facility] providers in the past year (date may be approximate).     Assessment:   This is a routine wellness examination for Banesa.  Hearing/Vision screen No results found.  Dietary issues and exercise activities discussed:     Goals Addressed   None    Depression Screen PHQ 2/9 Scores 02/03/2020 09/21/2019 10/22/2017 10/03/2015 08/17/2014 06/30/2013 12/30/2012  PHQ - 2 Score 0 0 2  0 0 0 0  PHQ- 9 Score - 0 6 - - - -    Fall Risk Fall Risk  02/03/2020 11/02/2019 09/21/2019 05/01/2019 10/22/2017  Falls in the past year? 0 0 0 0 No  Number falls in past yr: - 0 0 0 -  Injury with Fall? - - 0 0 -  Risk for fall due to : - - Orthopedic patient;Medication side effect - -  Follow up - - Falls evaluation completed;Falls prevention discussed Falls evaluation completed -    FALL RISK PREVENTION PERTAINING TO THE HOME:  Any  stairs in or around the home? Yes  If so, are there any without handrails? No  Home free of loose throw rugs in walkways, pet beds, electrical cords, etc? Yes  Adequate lighting in your home to reduce risk of falls? Yes   ASSISTIVE DEVICES UTILIZED TO PREVENT FALLS:  Life alert? No  Use of a cane, walker or w/c? Yes  Grab bars in the bathroom? Yes  Shower chair or bench in shower? Yes  Elevated toilet seat or a handicapped toilet? Yes    Cognitive Function:  Normal cognitive status assessed by direct observation by this Nurse Health Advisor. No abnormalities found.        Immunizations Immunization History  Administered Date(s) Administered   Fluad Quad(high Dose 65+) 09/30/2018, 11/02/2019   Influenza Split 10/30/2010, 10/17/2011, 10/22/2016   Influenza Whole 12/06/2008, 11/02/2009   Influenza, High Dose Seasonal PF 10/03/2015, 10/22/2017, 11/01/2019   Influenza,inj,Quad PF,6+ Mos 11/10/2012, 11/10/2013   Moderna Sars-Covid-2 Vaccination 02/03/2019, 03/16/2019, 09/14/2019   Pneumococcal Conjugate-13 06/30/2013   Pneumococcal Polysaccharide-23 08/23/2008   Td 10/26/2003   Tdap 06/30/2013    TDAP status: Up to date  Flu Vaccine status: Up to date  Pneumococcal vaccine status: Up to date  Covid-19 vaccine status: Completed vaccines  Qualifies for Shingles Vaccine? Yes   Zostavax completed No   Shingrix Completed?: No.    Education has been provided regarding the importance of this vaccine. Patient has been advised to call insurance company to determine out of pocket expense if they have not yet received this vaccine. Advised may also receive vaccine at local pharmacy or Health Dept. Verbalized acceptance and understanding.  Screening Tests Health Maintenance  Topic Date Due   Zoster Vaccines- Shingrix (1 of 2) Never done   COVID-19 Vaccine (4 - Booster for Moderna series) 12/15/2019   FOOT EXAM  12/30/2019   INFLUENZA VACCINE  08/22/2020   Hepatitis C Screening   11/01/2020 (Originally 11/26/1958)   OPHTHALMOLOGY EXAM  12/14/2020   HEMOGLOBIN A1C  02/02/2021   COLONOSCOPY (Pts 45-68yr Insurance coverage will need to be confirmed)  06/16/2023   TETANUS/TDAP  07/01/2023   DEXA SCAN  Completed   PNA vac Low Risk Adult  Completed   HPV VACCINES  Aged Out    Health Maintenance  Health Maintenance Due  Topic Date Due   Zoster Vaccines- Shingrix (1 of 2) Never done   COVID-19 Vaccine (4 - Booster for Moderna series) 12/15/2019   FOOT EXAM  12/30/2019   INFLUENZA VACCINE  08/22/2020    Colorectal cancer screening: No longer required.   Mammogram status: No longer required due to age.  Bone Density status: Completed 10/14/2019. Results reflect: Bone density results: OSTEOPENIA. Repeat every 5 years.  Lung Cancer Screening: (Low Dose CT Chest recommended if Age 78-80years, 30 pack-year currently smoking OR have quit w/in 15years.) does not qualify.   Lung Cancer Screening Referral: n/a  Additional Screening:  Hepatitis C Screening: does not qualify  Vision Screening: Recommended annual ophthalmology exams for early detection of glaucoma and other disorders of the eye. Is the patient up to date with their annual eye exam?  Yes  Who is the provider or what is the name of the office in which the patient attends annual eye exams? Dr.Weaver  If pt is not established with a provider, would they like to be referred to a provider to establish care? No .   Dental Screening: Recommended annual dental exams for proper oral hygiene  Community Resource Referral / Chronic Care Management: CRR required this visit?  No   CCM required this visit?  No      Plan:     I have personally reviewed and noted the following in the patient's chart:   Medical and social history Use of alcohol, tobacco or illicit drugs  Current medications and supplements including opioid prescriptions.  Functional ability and status Nutritional status Physical  activity Advanced directives List of other physicians Hospitalizations, surgeries, and ER visits in previous 12 months Vitals Screenings to include cognitive, depression, and falls Referrals and appointments  In addition, I have reviewed and discussed with patient certain preventive protocols, quality metrics, and best practice recommendations. A written personalized care plan for preventive services as well as general preventive health recommendations were provided to patient.     Randel Pigg, LPN   [id number]   Nurse Notes: none

## 2020-09-28 ENCOUNTER — Other Ambulatory Visit: Payer: Self-pay | Admitting: Family Medicine

## 2020-09-30 ENCOUNTER — Ambulatory Visit
Admission: RE | Admit: 2020-09-30 | Discharge: 2020-09-30 | Disposition: A | Discharge status: Home / Self Care | Payer: Medicare Other | Source: Ambulatory Visit | Attending: Family Medicine | Admitting: Family Medicine

## 2020-09-30 ENCOUNTER — Other Ambulatory Visit: Payer: Self-pay

## 2020-09-30 DIAGNOSIS — Z1231 Encounter for screening mammogram for malignant neoplasm of breast: Secondary | ICD-10-CM

## 2020-11-01 ENCOUNTER — Ambulatory Visit (INDEPENDENT_AMBULATORY_CARE_PROVIDER_SITE_OTHER): Payer: Medicare Other | Admitting: Family Medicine

## 2020-11-01 ENCOUNTER — Other Ambulatory Visit: Payer: Self-pay

## 2020-11-01 VITALS — BP 130/72 | HR 74 | Temp 98.1°F | Ht 64.0 in | Wt 228.8 lb

## 2020-11-01 DIAGNOSIS — Z23 Encounter for immunization: Secondary | ICD-10-CM | POA: Diagnosis not present

## 2020-11-01 DIAGNOSIS — Z0000 Encounter for general adult medical examination without abnormal findings: Secondary | ICD-10-CM | POA: Diagnosis not present

## 2020-11-01 DIAGNOSIS — R748 Abnormal levels of other serum enzymes: Secondary | ICD-10-CM

## 2020-11-01 LAB — COMPREHENSIVE METABOLIC PANEL
ALT: 13 U/L (ref 0–35)
AST: 25 U/L (ref 0–37)
Albumin: 4.3 g/dL (ref 3.5–5.2)
Alkaline Phosphatase: 79 U/L (ref 39–117)
BUN: 16 mg/dL (ref 6–23)
CO2: 29 mEq/L (ref 19–32)
Calcium: 9.4 mg/dL (ref 8.4–10.5)
Chloride: 102 mEq/L (ref 96–112)
Creatinine, Ser: 1.55 mg/dL — ABNORMAL HIGH (ref 0.40–1.20)
GFR: 32.02 mL/min — ABNORMAL LOW (ref >60.00)
Glucose, Bld: 140 mg/dL — ABNORMAL HIGH (ref 70–99)
Potassium: 4.3 mEq/L (ref 3.5–5.1)
Sodium: 140 mEq/L (ref 135–145)
Total Bilirubin: 0.9 mg/dL (ref 0.2–1.2)
Total Protein: 6.8 g/dL (ref 6.0–8.3)

## 2020-11-01 LAB — CBC WITH DIFFERENTIAL/PLATELET
Basophils Absolute: 0 10*3/uL (ref 0.0–0.1)
Basophils Relative: 0.5 % (ref 0.0–3.0)
Eosinophils Absolute: 0.1 10*3/uL (ref 0.0–0.7)
Eosinophils Relative: 1.9 % (ref 0.0–5.0)
HCT: 40.5 % (ref 36.0–46.0)
Hemoglobin: 12.9 g/dL (ref 12.0–15.0)
Lymphocytes Relative: 21 % (ref 12.0–46.0)
Lymphs Abs: 1.1 10*3/uL (ref 0.7–4.0)
MCHC: 31.9 g/dL (ref 30.0–36.0)
MCV: 80.9 fl (ref 78.0–100.0)
Monocytes Absolute: 0.3 10*3/uL (ref 0.1–1.0)
Monocytes Relative: 5.1 % (ref 3.0–12.0)
Neutro Abs: 3.8 10*3/uL (ref 1.4–7.7)
Neutrophils Relative %: 71.5 % (ref 43.0–77.0)
Platelets: 165 10*3/uL (ref 150.0–400.0)
RBC: 5.01 Mil/uL (ref 3.87–5.11)
RDW: 16.7 % — ABNORMAL HIGH (ref 11.5–15.5)
WBC: 5.3 10*3/uL (ref 4.0–10.5)

## 2020-11-01 LAB — LIPID PANEL
Cholesterol: 175 mg/dL (ref 0–200)
HDL: 83.8 mg/dL (ref >39.00)
LDL Cholesterol: 76 mg/dL (ref 0–99)
NonHDL: 91.26
Total CHOL/HDL Ratio: 2
Triglycerides: 76 mg/dL (ref 0.0–149.0)
VLDL: 15.2 mg/dL (ref 0.0–40.0)

## 2020-11-01 MED ORDER — METOPROLOL SUCCINATE ER 50 MG PO TB24: 50.0000 mg | ORAL_TABLET | Freq: Every day | ORAL | 3 refills | Status: DC

## 2020-11-01 NOTE — Progress Notes (Signed)
Established Patient Office Visit  Subjective:  Patient ID: Dawn Salas, female    DOB: 03-May-1940  Age: 80 y.o. MRN: [id number]  CC:  Chief Complaint  Patient presents with   Annual Exam    HPI Dawn Salas presents for physical exam.  She has chronic problems including hypertension, obstructive sleep apnea, GERD, hypothyroidism, post ablative hypothyroidism, history of meningiomas, chronic kidney disease, type 2 diabetes.  She is followed by endocrinology regarding her thyroid and diabetes.  Recent TSH normal range.  She has complaints of fatigue issues.  Has obstructive sleep apnea but did not tolerate CPAP.  She is on high-dose beta-blocker with Toprol-XL 100 mg daily.  No recent palpitations.  She had recent cervical neck surgery in June.  She has had some intermittent neck pain since then mostly left-sided.  No radiculitis symptoms.  Generally feels weak in her lower extremities.  She does swim some and has exercise cycle at home which she rides occasionally.  Health maintenance reviewed  -Still needs flu vaccine -No history of shingles vaccine. -Colonoscopy due 5/25 -Tetanus due 6/25 -DEXA scan 9/21  Social history and family history reviewed with no significant changes  Past Medical History:  Diagnosis Date   Allergy    Anxiety    Arthritis    Asthma    patient denies    Cataract    bilateral lens implants    Colon polyps    Depression    Diabetes mellitus    Type two   Diverticulitis    Double vision 2019   left eye   Family history of adverse reaction to anesthesia    daughter- N/V    Family history of ovarian cancer    Family history of pancreatic cancer    Frequency of urination    GERD (gastroesophageal reflux disease)    History of hiatal hernia    Hyperglycemia    Hyperlipidemia    Hypertension    Hyperthyroidism    PT HAD BIPOSY -NEGATIVE.Marland Kitchen AND NOW JUST GETS CHECKED EACH YEAR .Marland Kitchen 2013 LAST TEST   SEES DR. [name].    Multiple meningiomas of  spine and brain ([place])    Obstructive sleep apnea    STUDY AT [facility] DOES NOT USE C PAP   Palpitations    Peptic ulcer disease    Pneumonia    "walking pneumonia" hx of   Seizures ([place]) 2015   one siezure with brain surgery none since 2015    Sleep apnea    no CPAP   Urinary tract infection 03/2020    Past Surgical History:  Procedure Laterality Date   ABDOMINAL HYSTERECTOMY     BACK SURGERY     L SPIN 2003   NECK 2004   BRAIN MENINGIOMA EXCISION  8/10   X 2   C5-C6 neck fusion     COLONOSCOPY  05/05/2009   CRANIOTOMY  08/13/2011   Procedure: CRANIOTOMY TUMOR EXCISION;  Surgeon: [name], MD;  Location: [facility];  Service: Neurosurgery;  Laterality: Bilateral;  Bifrontal craniotomy for tumor   DILATION AND CURETTAGE OF UTERUS     YEARS AGO   L4-L5 posterior la     left foot plantar and hammertoe     right foot bunectomy     right knee arthroscopy     x 3   right shoulder rotator cuff     ROBOTIC ASSISTED BILATERAL SALPINGO OOPHERECTOMY N/A 08/25/2015   Procedure: XI ROBOTIC ASSISTED BILATERAL SALPINGO  OOPHORECTOMY;  Surgeon: [name], MD;  Location: [facility];  Service: Gynecology;  Laterality: N/A;   TONSILLECTOMY     TUBAL LIGATION     WRIST SURGERY Right     Family History  Problem Relation Age of Onset   Ovarian cancer Mother    Heart disease Father 43   Pancreatic cancer Maternal Aunt    Diabetes Maternal Grandmother    Anesthesia problems Daughter        NAUSEA AND VOMITING POST OP   Arthritis Other    Hyperlipidemia Other    Hypertension Other    Diabetes Other    Breast cancer Neg Hx    Colon cancer Neg Hx    Colon polyps Neg Hx    Esophageal cancer Neg Hx    Stomach cancer Neg Hx    Rectal cancer Neg Hx     Social History   Socioeconomic History   Marital status: Married    Spouse name: Not on file   Number of children: Not on file   Years of education: Not on file   Highest education level: Not on file  Occupational History    Not on file  Tobacco Use   Smoking status: Never   Smokeless tobacco: Never  Vaping Use   Vaping Use: Never used  Substance and Sexual Activity   Alcohol use: No   Drug use: No   Sexual activity: Never  Other Topics Concern   Not on file  Social History Narrative   Not on file   Social Determinants of Health   Financial Resource Strain: Low Risk    Difficulty of Paying Living Expenses: Not hard at all  Food Insecurity: No Food Insecurity   Worried About Charity fundraiser in the Last Year: Never true   Ran Out of Food in the Last Year: Never true  Transportation Needs: No Transportation Needs   Lack of Transportation (Medical): No   Lack of Transportation (Non-Medical): No  Physical Activity: Sufficiently Active   Days of Exercise per Week: 3 days   Minutes of Exercise per Session: 60 min  Stress: No Stress Concern Present   Feeling of Stress : Not at all  Social Connections: Socially Integrated   Frequency of Communication with Friends and Family: Three times a week   Frequency of Social Gatherings with Friends and Family: Three times a week   Attends Religious Services: More than 4 times per year   Active Member of Clubs or Organizations: Yes   Attends Archivist Meetings: 1 to 4 times per year   Marital Status: Married  Human resources officer Violence: Not At Risk   Fear of Current or Ex-Partner: No   Emotionally Abused: No   Physically Abused: No   Sexually Abused: No    Outpatient Medications Prior to Visit  Medication Sig Dispense Refill   ACCU-CHEK AVIVA PLUS test strip TEST BLOOD SUGAR TWICE DAILY. 50 strip 0   Accu-Chek Softclix Lancets lancets TEST BLOOD SUGAR TWICE DAILY. 100 each 0   acetaminophen (TYLENOL) 650 MG CR tablet Take 1,300 mg by mouth every 8 (eight) hours as needed for pain.     amLODipine (NORVASC) 5 MG tablet TAKE ONE (1) TABLET EACH DAY 90 tablet 1   ascorbic acid (VITAMIN C) 500 MG tablet Take 500 mg by mouth daily.      betamethasone dipropionate 0.05 % cream APPLY TO LEGS AS DIRECTED 45 g 0   cetirizine (ZYRTEC) 10 MG tablet Take  10 mg by mouth daily as needed for allergies.      Cyanocobalamin (VITAMIN B12 PO) Take 1 mL by mouth daily. OTC     furosemide (LASIX) 40 MG tablet Take 0.5 tablets (20 mg total) by mouth daily as needed. For fluid 30 tablet 3   Iron-Vitamins (GERITOL COMPLETE PO) Take 1 tablet by mouth daily.     JANUMET XR [id number] MG TB24 TAKE 1 TABLET DAILY AT LUNCH 90 tablet 1   Lancet Devices (ACCU-CHEK SOFTCLIX) lancets 1 each by Other route 2 (two) times daily. E11.9 100 each 3   levothyroxine (SYNTHROID) 75 MCG tablet Take 1 tablet (75 mcg total) by mouth daily before breakfast. 90 tablet 1   losartan-hydrochlorothiazide (HYZAAR) 100-12.5 MG tablet TAKE ONE (1) TABLET EACH DAY 90 tablet 3   Magnesium 250 MG TABS Take 250 mg by mouth daily.     meclizine (ANTIVERT) 25 MG tablet Take 25 mg by mouth 3 (three) times daily as needed for dizziness.      OVER THE COUNTER MEDICATION Apply 1 application topically daily as needed (pain). Oxyrub otc pain relieving gel     potassium chloride (KLOR-CON) 10 MEQ tablet TAKE ONE (1) TABLET EACH DAY 90 tablet 1   Propylene Glycol (SYSTANE BALANCE) 0.6 % SOLN Place 1 drop into both eyes daily.     RABEprazole (ACIPHEX) 20 MG tablet TAKE ONE (1) TABLET EACH DAY 90 tablet 1   rosuvastatin (CRESTOR) 40 MG tablet TAKE ONE (1) TABLET EACH DAY 90 tablet 2   solifenacin (VESICARE) 5 MG tablet TAKE ONE (1) TABLET EACH DAY (Patient taking differently: Take 5 mg by mouth daily.) 30 tablet 1   valACYclovir (VALTREX) 1000 MG tablet TAKE 2 TABS TWICE DAILY FOR 1 DAY THEN AS NEEDED 30 tablet 1   cephALEXin (KEFLEX) 500 MG capsule Take 1 capsule (500 mg total) by mouth 3 (three) times daily. 21 capsule 0   metoprolol succinate (TOPROL-XL) 100 MG 24 hr tablet TAKE ONE (1) TABLET EACH DAY 90 tablet 1   No facility-administered medications prior to visit.    Allergies   Allergen Reactions   Flagyl [Metronidazole] Other (See Comments)    Caused increased heart rate   Aspirin Effervescent Swelling    Alka-Seltzer gel caps- sore mouth, face swollen, turned hands white   Morphine Sulfate Hives    Hallucinations   Penicillins Swelling and Rash    Has patient had a PCN reaction causing immediate rash, facial/tongue/throat swelling, SOB or lightheadedness with hypotension: Yes Has patient had a PCN reaction causing severe rash involving mucus membranes or skin necrosis: No Has patient had a PCN reaction that required hospitalization No Has patient had a PCN reaction occurring within the last 10 years: No If all of the above answers are "NO", then may proceed with Cephalosporin use.     ROS Review of Systems  Constitutional:  Positive for fatigue. Negative for appetite change, chills and unexpected weight change.  Eyes:  Negative for visual disturbance.  Respiratory:  Negative for cough, chest tightness, shortness of breath and wheezing.   Cardiovascular:  Negative for chest pain, palpitations and leg swelling.  Gastrointestinal:  Negative for abdominal pain.  Genitourinary:  Negative for dysuria.  Musculoskeletal:  Positive for arthralgias.  Neurological:  Negative for dizziness, seizures, syncope, weakness, light-headedness and headaches.     Objective:    Physical Exam Constitutional:      Appearance: She is well-developed.  HENT:     Right Ear: Ear  canal normal.     Left Ear: Ear canal normal.  Eyes:     Pupils: Pupils are equal, round, and reactive to light.  Neck:     Thyroid: No thyromegaly.     Vascular: No JVD.  Cardiovascular:     Rate and Rhythm: Normal rate and regular rhythm.     Heart sounds:    No gallop.  Pulmonary:     Effort: Pulmonary effort is normal. No respiratory distress.     Breath sounds: Normal breath sounds. No wheezing or rales.  Abdominal:     Palpations: Abdomen is soft. There is no mass.     Tenderness:  There is no abdominal tenderness.  Musculoskeletal:     Cervical back: Neck supple.     Right lower leg: No edema.     Left lower leg: No edema.  Neurological:     General: No focal deficit present.     Mental Status: She is alert.    BP 130/72 (BP Location: Left Arm, Patient Position: Sitting, Cuff Size: Normal)   Pulse 74   Temp 98.1 F (36.7 C) (Oral)   Ht 5\' 4"  (1.626 m)   Wt 228 lb 12.8 oz (103.8 kg)   SpO2 99%   BMI 39.27 kg/m  Wt Readings from Last 3 Encounters:  11/01/20 228 lb 12.8 oz (103.8 kg)  08/02/20 224 lb 6.4 oz (101.8 kg)  07/04/20 224 lb (101.6 kg)     Health Maintenance Due  Topic Date Due   COVID-19 Vaccine (4 - Booster for Moderna series) 12/07/2019   FOOT EXAM  12/30/2019    There are no preventive care reminders to display for this patient.  Lab Results  Component Value Date   TSH 5.18 08/02/2020   Lab Results  Component Value Date   WBC 4.6 02/02/2020   HGB 12.3 02/02/2020   HCT 37.9 02/02/2020   MCV 79.6 02/02/2020   PLT 159.0 02/02/2020   Lab Results  Component Value Date   NA 138 08/02/2020   K 4.2 08/02/2020   CO2 27 08/02/2020   GLUCOSE 97 08/02/2020   BUN 14 08/02/2020   CREATININE 1.23 (H) 08/02/2020   BILITOT 0.8 02/02/2020   ALKPHOS 83 02/02/2020   AST 21 02/02/2020   ALT 11 02/02/2020   PROT 6.7 02/02/2020   ALBUMIN 4.4 02/02/2020   CALCIUM 9.5 08/02/2020   ANIONGAP 10 07/03/2019   GFR 42.34 (L) 08/02/2020   Lab Results  Component Value Date   CHOL 174 02/02/2020   Lab Results  Component Value Date   HDL 83.10 02/02/2020   Lab Results  Component Value Date   LDLCALC 76 02/02/2020   Lab Results  Component Value Date   TRIG 73.0 02/02/2020   Lab Results  Component Value Date   CHOLHDL 2 02/02/2020   Lab Results  Component Value Date   HGBA1C 6.3 (A) 08/02/2020      Assessment & Plan:   Problem List Items Addressed This Visit   None Visit Diagnoses     Need for immunization against influenza     -  Primary   Relevant Orders   Flu Vaccine QUAD High Dose(Fluad) (Completed)   Physical exam       Relevant Orders   CBC with Differential/Platelet   CMP   Lipid panel     She has some fatigue issues.  We discussed this may be multifactorial.  She is on fairly high-dose beta-blocker.  Also history of obstructive  sleep apnea currently not on CPAP.  -Flu vaccine given -We will try reducing her Toprol-XL to 50 mg daily to see if this helps any with her fatigue issues -Consider referral back to sleep specialist regarding her obstructive sleep apnea especially if not improved with reducing dose of Toprol-XL -Discussed Shingrix vaccine but she declines at this time.  Cost has been an issue previously -Obtain labs as above.  She had recent TSH and A1c being monitored by endocrinology  Meds ordered this encounter  Medications   metoprolol succinate (TOPROL-XL) 50 MG 24 hr tablet    Sig: Take 1 tablet (50 mg total) by mouth daily. Take with or immediately following a meal.    Dispense:  90 tablet    Refill:  3    Follow-up: No follow-ups on file.    Carolann Littler, MD

## 2020-11-01 NOTE — Patient Instructions (Signed)
Consider Shingrix vaccine  Let's reduce the Metoprolol to 50 mg daily to see if this helps any with the fatigue.

## 2020-12-02 ENCOUNTER — Other Ambulatory Visit (INDEPENDENT_AMBULATORY_CARE_PROVIDER_SITE_OTHER): Payer: Medicare Other

## 2020-12-02 DIAGNOSIS — R748 Abnormal levels of other serum enzymes: Secondary | ICD-10-CM

## 2020-12-02 LAB — BASIC METABOLIC PANEL
BUN: 17 mg/dL (ref 6–23)
CO2: 29 mEq/L (ref 19–32)
Calcium: 9.1 mg/dL (ref 8.4–10.5)
Chloride: 104 mEq/L (ref 96–112)
Creatinine, Ser: 1.36 mg/dL — ABNORMAL HIGH (ref 0.40–1.20)
GFR: 37.44 mL/min — ABNORMAL LOW (ref >60.00)
Glucose, Bld: 114 mg/dL — ABNORMAL HIGH (ref 70–99)
Potassium: 4.1 mEq/L (ref 3.5–5.1)
Sodium: 141 mEq/L (ref 135–145)

## 2020-12-21 ENCOUNTER — Other Ambulatory Visit: Payer: Self-pay | Admitting: Endocrinology

## 2021-01-02 ENCOUNTER — Other Ambulatory Visit: Payer: Self-pay | Admitting: Family Medicine

## 2021-01-30 ENCOUNTER — Ambulatory Visit (INDEPENDENT_AMBULATORY_CARE_PROVIDER_SITE_OTHER): Payer: Medicare Other | Admitting: Family Medicine

## 2021-01-30 VITALS — BP 132/70 | HR 84 | Temp 98.3°F | Wt 228.9 lb

## 2021-01-30 DIAGNOSIS — Z01818 Encounter for other preprocedural examination: Secondary | ICD-10-CM

## 2021-01-30 DIAGNOSIS — N181 Chronic kidney disease, stage 1: Secondary | ICD-10-CM | POA: Diagnosis not present

## 2021-01-30 DIAGNOSIS — N1831 Chronic kidney disease, stage 3a: Secondary | ICD-10-CM

## 2021-01-30 DIAGNOSIS — E7800 Pure hypercholesterolemia, unspecified: Secondary | ICD-10-CM

## 2021-01-30 DIAGNOSIS — I10 Essential (primary) hypertension: Secondary | ICD-10-CM

## 2021-01-30 DIAGNOSIS — E1122 Type 2 diabetes mellitus with diabetic chronic kidney disease: Secondary | ICD-10-CM | POA: Diagnosis not present

## 2021-01-30 LAB — POCT GLYCOSYLATED HEMOGLOBIN (HGB A1C): Hemoglobin A1C: 6.2 % — AB (ref 4.0–5.6)

## 2021-01-30 NOTE — Progress Notes (Signed)
Established Patient Office Visit  Subjective:  Patient ID: Dawn Salas, female    DOB: 1940/02/09  Age: 81 y.o. MRN: [id number]  CC: No chief complaint on file.   HPI Dawn Salas presents for preoperative assessment for right total knee replacement.  Her surgery is not actually scheduled till May 15.  She specifically is inquiring about seeing a local cardiologist.  She has previously seen cardiology group over at [facility].  She would like to see Dr. Harl Bowie over in the [place] office.  She does not have any specific cardiac history but does have type 2 diabetes.  No recent chest pains.  Her chronic problems include hypertension, obstructive sleep apnea, GERD, type 2 diabetes, hypertension, post ablative hypothyroidism.  Past history of multinodular goiter.  History of meningiomas.  She used to swim regularly but has not been swimming as much in recent months.  No consistent exercise recently.  Diabetes has been well controlled.  She has hypertension which has been fairly well controlled with amlodipine and losartan HCTZ.  She also takes metoprolol.  Past Medical History:  Diagnosis Date   Allergy    Anxiety    Arthritis    Asthma    patient denies    Cataract    bilateral lens implants    Colon polyps    Depression    Diabetes mellitus    Type two   Diverticulitis    Double vision 2019   left eye   Family history of adverse reaction to anesthesia    daughter- N/V    Family history of ovarian cancer    Family history of pancreatic cancer    Frequency of urination    GERD (gastroesophageal reflux disease)    History of hiatal hernia    Hyperglycemia    Hyperlipidemia    Hypertension    Hyperthyroidism    PT HAD BIPOSY -NEGATIVE.Marland Kitchen AND NOW JUST GETS CHECKED EACH YEAR .Marland Kitchen 2013 LAST TEST   SEES DR. [name].    Multiple meningiomas of spine and brain ([place])    Obstructive sleep apnea    [facility] DOES NOT USE C PAP   Palpitations    Peptic ulcer disease    Pneumonia     "walking pneumonia" hx of   Seizures ([place]) 2015   one siezure with brain surgery none since 2015    Sleep apnea    no CPAP   Urinary tract infection 03/2020    Past Surgical History:  Procedure Laterality Date   ABDOMINAL HYSTERECTOMY     BACK SURGERY     L SPIN 2003   NECK 2004   BRAIN MENINGIOMA EXCISION  8/10   X 2   C5-C6 neck fusion     COLONOSCOPY  05/05/2009   CRANIOTOMY  08/13/2011   Procedure: CRANIOTOMY TUMOR EXCISION;  Surgeon: [name], MD;  Location: [place] [facility];  Service: Neurosurgery;  Laterality: Bilateral;  Bifrontal craniotomy for tumor   DILATION AND CURETTAGE OF UTERUS     YEARS AGO   L4-L5 posterior la     left foot plantar and hammertoe     right foot bunectomy     right knee arthroscopy     x 3   right shoulder rotator cuff     ROBOTIC ASSISTED BILATERAL SALPINGO OOPHERECTOMY N/A 08/25/2015   Procedure: XI ROBOTIC ASSISTED BILATERAL SALPINGO OOPHORECTOMY;  Surgeon: [name], MD;  Location: [facility];  Service: Gynecology;  Laterality: N/A;  TONSILLECTOMY     TUBAL LIGATION     WRIST SURGERY Right     Family History  Problem Relation Age of Onset   Ovarian cancer Mother    Heart disease Father 69   Pancreatic cancer Maternal Aunt    Diabetes Maternal Grandmother    Anesthesia problems Daughter        NAUSEA AND VOMITING POST OP   Arthritis Other    Hyperlipidemia Other    Hypertension Other    Diabetes Other    Breast cancer Neg Hx    Colon cancer Neg Hx    Colon polyps Neg Hx    Esophageal cancer Neg Hx    Stomach cancer Neg Hx    Rectal cancer Neg Hx     Social History   Socioeconomic History   Marital status: Married    Spouse name: Not on file   Number of children: Not on file   Years of education: Not on file   Highest education level: Not on file  Occupational History   Not on file  Tobacco Use   Smoking status: Never   Smokeless tobacco: Never  Vaping Use   Vaping Use: Never used  Substance and Sexual  Activity   Alcohol use: No   Drug use: No   Sexual activity: Never  Other Topics Concern   Not on file  Social History Narrative   Not on file   Social Determinants of Health   Financial Resource Strain: Low Risk    Difficulty of Paying Living Expenses: Not hard at all  Food Insecurity: No Food Insecurity   Worried About Charity fundraiser in the Last Year: Never true   Ran Out of Food in the Last Year: Never true  Transportation Needs: No Transportation Needs   Lack of Transportation (Medical): No   Lack of Transportation (Non-Medical): No  Physical Activity: Sufficiently Active   Days of Exercise per Week: 3 days   Minutes of Exercise per Session: 60 min  Stress: No Stress Concern Present   Feeling of Stress : Not at all  Social Connections: Socially Integrated   Frequency of Communication with Friends and Family: Three times a week   Frequency of Social Gatherings with Friends and Family: Three times a week   Attends Religious Services: More than 4 times per year   Active Member of Clubs or Organizations: Yes   Attends Archivist Meetings: 1 to 4 times per year   Marital Status: Married  Human resources officer Violence: Not At Risk   Fear of Current or Ex-Partner: No   Emotionally Abused: No   Physically Abused: No   Sexually Abused: No    Outpatient Medications Prior to Visit  Medication Sig Dispense Refill   ACCU-CHEK AVIVA PLUS test strip TEST BLOOD SUGAR TWICE DAILY. 50 strip 0   Accu-Chek Softclix Lancets lancets TEST BLOOD SUGAR TWICE DAILY. 100 each 0   acetaminophen (TYLENOL) 650 MG CR tablet Take 1,300 mg by mouth every 8 (eight) hours as needed for pain.     amLODipine (NORVASC) 5 MG tablet TAKE ONE (1) TABLET EACH DAY 90 tablet 1   ascorbic acid (VITAMIN C) 500 MG tablet Take 500 mg by mouth daily.     betamethasone dipropionate 0.05 % cream APPLY TO LEGS AS DIRECTED 45 g 0   cetirizine (ZYRTEC) 10 MG tablet Take 10 mg by mouth daily as needed for  allergies.      Cyanocobalamin (VITAMIN B12 PO)  Take 1 mL by mouth daily. OTC     furosemide (LASIX) 40 MG tablet Take 0.5 tablets (20 mg total) by mouth daily as needed. For fluid 30 tablet 3   Iron-Vitamins (GERITOL COMPLETE PO) Take 1 tablet by mouth daily.     JANUMET XR [phone] MG TB24 TAKE 1 TABLET DAILY AT LUNCH 90 tablet 1   Lancet Devices (ACCU-CHEK SOFTCLIX) lancets 1 each by Other route 2 (two) times daily. E11.9 100 each 3   levothyroxine (SYNTHROID) 75 MCG tablet Take 1 tablet (75 mcg total) by mouth daily before breakfast. 90 tablet 1   losartan-hydrochlorothiazide (HYZAAR) 100-12.5 MG tablet TAKE ONE (1) TABLET EACH DAY 90 tablet 3   Magnesium 250 MG TABS Take 250 mg by mouth daily.     meclizine (ANTIVERT) 25 MG tablet Take 25 mg by mouth 3 (three) times daily as needed for dizziness.      metoprolol succinate (TOPROL-XL) 50 MG 24 hr tablet Take 1 tablet (50 mg total) by mouth daily. Take with or immediately following a meal. 90 tablet 3   OVER THE COUNTER MEDICATION Apply 1 application topically daily as needed (pain). Oxyrub otc pain relieving gel     potassium chloride (KLOR-CON) 10 MEQ tablet TAKE ONE (1) TABLET EACH DAY 90 tablet 1   Propylene Glycol (SYSTANE BALANCE) 0.6 % SOLN Place 1 drop into both eyes daily.     RABEprazole (ACIPHEX) 20 MG tablet TAKE ONE (1) TABLET EACH DAY 90 tablet 1   rosuvastatin (CRESTOR) 40 MG tablet TAKE ONE (1) TABLET EACH DAY 90 tablet 2   solifenacin (VESICARE) 5 MG tablet TAKE ONE (1) TABLET EACH DAY (Patient taking differently: Take 5 mg by mouth daily.) 30 tablet 1   valACYclovir (VALTREX) 1000 MG tablet TAKE 2 TABS TWICE DAILY FOR 1 DAY THEN AS NEEDED 30 tablet 1   No facility-administered medications prior to visit.    Allergies  Allergen Reactions   Flagyl [Metronidazole] Other (See Comments)    Caused increased heart rate   Aspirin Effervescent Swelling    Alka-Seltzer gel caps- sore mouth, face swollen, turned hands white    Morphine Sulfate Hives    Hallucinations   Penicillins Swelling and Rash    Has patient had a PCN reaction causing immediate rash, facial/tongue/throat swelling, SOB or lightheadedness with hypotension: Yes Has patient had a PCN reaction causing severe rash involving mucus membranes or skin necrosis: No Has patient had a PCN reaction that required hospitalization No Has patient had a PCN reaction occurring within the last 10 years: No If all of the above answers are "NO", then may proceed with Cephalosporin use.     ROS Review of Systems  Constitutional:  Negative for fatigue.  Eyes:  Negative for visual disturbance.  Respiratory:  Negative for cough, chest tightness, shortness of breath and wheezing.   Cardiovascular:  Negative for chest pain, palpitations and leg swelling.  Endocrine: Negative for polydipsia and polyuria.  Neurological:  Negative for dizziness, seizures, syncope, weakness, light-headedness and headaches.     Objective:    Physical Exam Constitutional:      Appearance: She is well-developed.  Neck:     Thyroid: No thyromegaly.     Vascular: No JVD.  Cardiovascular:     Rate and Rhythm: Normal rate and regular rhythm.     Heart sounds:    No gallop.  Pulmonary:     Effort: Pulmonary effort is normal. No respiratory distress.     Breath  sounds: Normal breath sounds. No wheezing or rales.  Musculoskeletal:     Cervical back: Neck supple.     Right lower leg: No edema.     Left lower leg: No edema.  Neurological:     General: No focal deficit present.     Mental Status: She is alert.    BP 132/70 (BP Location: Left Arm, Patient Position: Sitting, Cuff Size: Normal)    Pulse 84    Temp 98.3 F (36.8 C) (Oral)    Wt 228 lb 14.4 oz (103.8 kg)    SpO2 97%    BMI 39.29 kg/m  Wt Readings from Last 3 Encounters:  01/30/21 228 lb 14.4 oz (103.8 kg)  11/01/20 228 lb 12.8 oz (103.8 kg)  08/02/20 224 lb 6.4 oz (101.8 kg)     Health Maintenance Due  Topic  Date Due   Hepatitis C Screening  Never done   COVID-19 Vaccine (4 - Booster for Moderna series) 11/09/2019   FOOT EXAM  12/30/2019   OPHTHALMOLOGY EXAM  12/14/2020    There are no preventive care reminders to display for this patient.  Lab Results  Component Value Date   TSH 5.18 08/02/2020   Lab Results  Component Value Date   WBC 5.3 11/01/2020   HGB 12.9 11/01/2020   HCT 40.5 11/01/2020   MCV 80.9 11/01/2020   PLT 165.0 11/01/2020   Lab Results  Component Value Date   NA 141 12/02/2020   K 4.1 12/02/2020   CO2 29 12/02/2020   GLUCOSE 114 (H) 12/02/2020   BUN 17 12/02/2020   CREATININE 1.36 (H) 12/02/2020   BILITOT 0.9 11/01/2020   ALKPHOS 79 11/01/2020   AST 25 11/01/2020   ALT 13 11/01/2020   PROT 6.8 11/01/2020   ALBUMIN 4.3 11/01/2020   CALCIUM 9.1 12/02/2020   ANIONGAP 10 07/03/2019   GFR 37.44 (L) 12/02/2020   Lab Results  Component Value Date   CHOL 175 11/01/2020   Lab Results  Component Value Date   HDL 83.80 11/01/2020   Lab Results  Component Value Date   LDLCALC 76 11/01/2020   Lab Results  Component Value Date   TRIG 76.0 11/01/2020   Lab Results  Component Value Date   CHOLHDL 2 11/01/2020   Lab Results  Component Value Date   HGBA1C 6.2 (A) 01/30/2021      Assessment & Plan:   Preoperative assessment.  Patient has type 2 diabetes which remains well controlled with A1c today 6.2%.  She has hypertension which is well controlled.  Had stress echo way back about 10 years ago unremarkable.  She apparently had nuclear stress test over at [facility] at some point but we have no records of that.  No known contraindications to surgery at this time.  -She will need further labs including CBC, CMP, INR, and repeat A1c closer to time of surgery.  We explained these are usually done within 30 days of her surgery.  Her current scheduled date of surgery was May 15. -Patient would like to go ahead and see cardiologist over in [place] preoperatively  and this seems reasonable given her age and type 2 diabetes status.   No orders of the defined types were placed in this encounter.   Follow-up: No follow-ups on file.    Carolann Littler, MD

## 2021-01-30 NOTE — Patient Instructions (Signed)
We need to make sure you get pre-op labs either here or at hospital around mid April in advance of surgery.   A1C today remains well controlled at 6.2%.

## 2021-02-02 ENCOUNTER — Ambulatory Visit (INDEPENDENT_AMBULATORY_CARE_PROVIDER_SITE_OTHER): Payer: Medicare Other | Admitting: Endocrinology

## 2021-02-02 ENCOUNTER — Other Ambulatory Visit: Payer: Self-pay

## 2021-02-02 ENCOUNTER — Ambulatory Visit: Payer: Medicare Other | Admitting: Endocrinology

## 2021-02-02 ENCOUNTER — Encounter: Payer: Self-pay | Admitting: Endocrinology

## 2021-02-02 VITALS — BP 148/78 | HR 74 | Ht 60.5 in | Wt 229.6 lb

## 2021-02-02 DIAGNOSIS — E1169 Type 2 diabetes mellitus with other specified complication: Secondary | ICD-10-CM

## 2021-02-02 DIAGNOSIS — E7800 Pure hypercholesterolemia, unspecified: Secondary | ICD-10-CM | POA: Diagnosis not present

## 2021-02-02 DIAGNOSIS — E890 Postprocedural hypothyroidism: Secondary | ICD-10-CM | POA: Diagnosis not present

## 2021-02-02 DIAGNOSIS — E669 Obesity, unspecified: Secondary | ICD-10-CM

## 2021-02-02 LAB — GLUCOSE, RANDOM: Glucose, Bld: 117 mg/dL — ABNORMAL HIGH (ref 70–99)

## 2021-02-02 LAB — MICROALBUMIN / CREATININE URINE RATIO
Creatinine,U: 148.1 mg/dL
Microalb Creat Ratio: 0.8 mg/g (ref 0.0–30.0)
Microalb, Ur: 1.2 mg/dL (ref 0.0–1.9)

## 2021-02-02 LAB — HM DIABETES EYE EXAM

## 2021-02-02 LAB — T4, FREE: Free T4: 0.71 ng/dL (ref 0.60–1.60)

## 2021-02-02 NOTE — Progress Notes (Signed)
Patient ID: Dawn Salas, female   DOB: 27-Oct-1940, 81 y.o.   MRN: [id number]                                                                                                                  Chief complaint: Endocrinology follow-up   History of Present Illness:   History obtained on initial consultation:  She was diagnosed as having Hyperthyroidism in 2015 when she was having palpitations and was seen to have a goiter. At that time she was having the usual symptoms of fatigue, shakiness, palpitations, heat intolerance and some weight loss No details of this are available She was treated with I-131, unknown dose which was prescribed in March 2015 Apparently she had a large hot nodule on the left, however details of her treatment are not available  Apparently subsequently her hyperthyroidism recurred and methimazole restarted ?  In 4/17 Also the dose was increased up to 7.5 mg in 10/2015  Apparently prior to her being treated for the hot nodule she had a multinodular goiter evaluated with a fine-needle aspiration in 09/2010  Since she was significantly hyperthyroid on her initial exam in February and free T3 was 5.0 and was treated with methimazole Because of persistent hyperthyroidism and very high thyrotropin receptor antibody she was given I-131 treatment with 20.3 mCi on 06/04/16  RECENT history:  She has been HYPOTHYROID as of 07/2016 with a low free T4 level  She was also having some fatigue and cold sensitivity as well as the muscle cramps With starting levothyroxine 125 g she felt better, however subsequently TSH was low again the dose was reduced progressively  She is taking 75g of levothyroxine 1/2 daily since 7/22 At that time her TSH was high normal and she was supposed to take an extra half pill weekly However she misunderstood her instructions and is only taking half a tablet daily She may feel cold at times  She feels fatigued again, she mentioned this since  2/22  She has taken her levothyroxine before breakfast daily and has not missed any doses   She takes her Geritol with iron after breakfast half an hour later  Wt Readings from Last 3 Encounters:  02/02/21 229 lb 9.6 oz (104.1 kg)  01/30/21 228 lb 14.4 oz (103.8 kg)  11/01/20 228 lb 12.8 oz (103.8 kg)     Thyroid function tests as follows:     Lab Results  Component Value Date   TSH 5.18 08/02/2020   TSH 3.76 02/02/2020   TSH 1.93 07/29/2019   FREET4 1.02 08/02/2020   FREET4 1.03 02/02/2020   FREET4 1.11 07/29/2019   Ophthalmopathy:  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 with wearing prism lenses Has some tearing of the eyes and has been given drops by the ophthalmologist She is followed by ophthalmologist regularly Previous MRI had shown mild thickening of the rectus muscle on the left in 2018  DIABETES: See review of systems    Allergies as of 02/02/2021  Reactions   Flagyl [metronidazole] Other (See Comments)   Caused increased heart rate   Aspirin Effervescent Swelling   Alka-Seltzer gel caps- sore mouth, face swollen, turned hands white   Morphine Sulfate Hives   Hallucinations   Penicillins Swelling, Rash   Has patient had a PCN reaction causing immediate rash, facial/tongue/throat swelling, SOB or lightheadedness with hypotension: Yes Has patient had a PCN reaction causing severe rash involving mucus membranes or skin necrosis: No Has patient had a PCN reaction that required hospitalization No Has patient had a PCN reaction occurring within the last 10 years: No If all of the above answers are "NO", then may proceed with Cephalosporin use.        Medication List        Accurate as of February 02, 2021  1:11 PM. If you have any questions, ask your nurse or doctor.          Accu-Chek Aviva Plus test strip Generic drug: glucose blood TEST BLOOD SUGAR TWICE DAILY.   accu-chek softclix lancets 1 each by Other route 2 (two) times  daily. E11.9   Accu-Chek Softclix Lancets lancets TEST BLOOD SUGAR TWICE DAILY.   acetaminophen 650 MG CR tablet Commonly known as: TYLENOL Take 1,300 mg by mouth every 8 (eight) hours as needed for pain.   amLODipine 5 MG tablet Commonly known as: NORVASC TAKE ONE (1) TABLET EACH DAY   ascorbic acid 500 MG tablet Commonly known as: VITAMIN C Take 500 mg by mouth daily.   betamethasone dipropionate 0.05 % cream APPLY TO LEGS AS DIRECTED   cetirizine 10 MG tablet Commonly known as: ZYRTEC Take 10 mg by mouth daily as needed for allergies.   furosemide 40 MG tablet Commonly known as: LASIX Take 0.5 tablets (20 mg total) by mouth daily as needed. For fluid   GERITOL COMPLETE PO Take 1 tablet by mouth daily.   Janumet XR [phone] MG Tb24 Generic drug: SitaGLIPtin-MetFORMIN HCl TAKE 1 TABLET DAILY AT LUNCH   levothyroxine 75 MCG tablet Commonly known as: SYNTHROID Take 1 tablet (75 mcg total) by mouth daily before breakfast.   losartan-hydrochlorothiazide 100-12.5 MG tablet Commonly known as: HYZAAR TAKE ONE (1) TABLET EACH DAY   Magnesium 250 MG Tabs Take 250 mg by mouth daily.   meclizine 25 MG tablet Commonly known as: ANTIVERT Take 25 mg by mouth 3 (three) times daily as needed for dizziness.   metoprolol succinate 50 MG 24 hr tablet Commonly known as: TOPROL-XL Take 1 tablet (50 mg total) by mouth daily. Take with or immediately following a meal.   OVER THE COUNTER MEDICATION Apply 1 application topically daily as needed (pain). Oxyrub otc pain relieving gel   potassium chloride 10 MEQ tablet Commonly known as: KLOR-CON TAKE ONE (1) TABLET EACH DAY   RABEprazole 20 MG tablet Commonly known as: ACIPHEX TAKE ONE (1) TABLET EACH DAY   rosuvastatin 40 MG tablet Commonly known as: CRESTOR TAKE ONE (1) TABLET EACH DAY   solifenacin 5 MG tablet Commonly known as: VESICARE TAKE ONE (1) TABLET EACH DAY What changed: See the new instructions.   Systane  Balance 0.6 % Soln Generic drug: Propylene Glycol Place 1 drop into both eyes daily.   valACYclovir 1000 MG tablet Commonly known as: VALTREX TAKE 2 TABS TWICE DAILY FOR 1 DAY THEN AS NEEDED   VITAMIN B12 PO Take 1 mL by mouth daily. OTC            Past Medical History:  Diagnosis Date   Allergy    Anxiety    Arthritis    Asthma    patient denies    Cataract    bilateral lens implants    Colon polyps    Depression    Diabetes mellitus    Type two   Diverticulitis    Double vision 2019   left eye   Family history of adverse reaction to anesthesia    daughter- N/V    Family history of ovarian cancer    Family history of pancreatic cancer    Frequency of urination    GERD (gastroesophageal reflux disease)    History of hiatal hernia    Hyperglycemia    Hyperlipidemia    Hypertension    Hyperthyroidism    PT HAD BIPOSY -NEGATIVE.Marland Kitchen AND NOW JUST GETS CHECKED EACH YEAR .Marland Kitchen 2013 LAST TEST   SEES DR. [name].    Multiple meningiomas of spine and brain ([place])    Obstructive sleep apnea    STUDY AT [facility] DOES NOT USE C PAP   Palpitations    Peptic ulcer disease    Pneumonia    "walking pneumonia" hx of   Seizures ([place]) 2015   one siezure with brain surgery none since 2015    Sleep apnea    no CPAP   Urinary tract infection 03/2020    Past Surgical History:  Procedure Laterality Date   ABDOMINAL HYSTERECTOMY     BACK SURGERY     L SPIN 2003   NECK 2004   BRAIN MENINGIOMA EXCISION  8/10   X 2   C5-C6 neck fusion     COLONOSCOPY  05/05/2009   CRANIOTOMY  08/13/2011   Procedure: CRANIOTOMY TUMOR EXCISION;  Surgeon: [name], MD;  Location: [place] [facility];  Service: Neurosurgery;  Laterality: Bilateral;  Bifrontal craniotomy for tumor   DILATION AND CURETTAGE OF UTERUS     YEARS AGO   L4-L5 posterior la     left foot plantar and hammertoe     right foot bunectomy     right knee arthroscopy     x 3   right shoulder rotator cuff     ROBOTIC ASSISTED  BILATERAL SALPINGO OOPHERECTOMY N/A 08/25/2015   Procedure: XI ROBOTIC ASSISTED BILATERAL SALPINGO OOPHORECTOMY;  Surgeon: [name], MD;  Location: [facility];  Service: Gynecology;  Laterality: N/A;   TONSILLECTOMY     TUBAL LIGATION     WRIST SURGERY Right     Family History  Problem Relation Age of Onset   Ovarian cancer Mother    Heart disease Father 73   Pancreatic cancer Maternal Aunt    Diabetes Maternal Grandmother    Anesthesia problems Daughter        NAUSEA AND VOMITING POST OP   Arthritis Other    Hyperlipidemia Other    Hypertension Other    Diabetes Other    Breast cancer Neg Hx    Colon cancer Neg Hx    Colon polyps Neg Hx    Esophageal cancer Neg Hx    Stomach cancer Neg Hx    Rectal cancer Neg Hx     Social History:  reports that she has never smoked. She has never used smokeless tobacco. She reports that she does not drink alcohol and does not use drugs.  Allergies:  Allergies  Allergen Reactions   Flagyl [Metronidazole] Other (See Comments)    Caused increased heart rate   Aspirin Effervescent Swelling  Alka-Seltzer gel caps- sore mouth, face swollen, turned hands white   Morphine Sulfate Hives    Hallucinations   Penicillins Swelling and Rash    Has patient had a PCN reaction causing immediate rash, facial/tongue/throat swelling, SOB or lightheadedness with hypotension: Yes Has patient had a PCN reaction causing severe rash involving mucus membranes or skin necrosis: No Has patient had a PCN reaction that required hospitalization No Has patient had a PCN reaction occurring within the last 10 years: No If all of the above answers are "NO", then may proceed with Cephalosporin use.       Review of Systems    DIABETES   She has had longstanding diabetes, mostly treated with metformin  Since she had relatively higher A1c of 7% and increasing blood sugars she was switched from metformin to Janumet in 6/21 She was also feeling shaky sometimes  when taking metformin in the morning She has not been seen by dietitian  She takes JANUMET around noontime since she thinks that taking it earlier makes her sugar get low  She has fairly good control with A1c below 6.5 again Generally tries to eat healthy meals However sometimes in the morning do not have any protein and just has pancakes, otherwise usually has eggs or meat She is trying to do some exercise on her stationary bike Her weight is about the same lately   Blood sugars from review of the monitor:  PRE-MEAL Fasting Lunch Dinner Bedtime Overall  Glucose range: 97-102   90-127   Mean     105 (30-day)   Previous readings:  PRE-MEAL Fasting Lunch Dinner Bedtime Overall  Glucose range:  117-133   113    Mean/median:      135   POST-MEAL PC Breakfast PC Lunch PC Dinner  Glucose range:  194  136   Mean/median:      Wt Readings from Last 3 Encounters:  02/02/21 229 lb 9.6 oz (104.1 kg)  01/30/21 228 lb 14.4 oz (103.8 kg)  11/01/20 228 lb 12.8 oz (103.8 kg)    Lab Results  Component Value Date   HGBA1C 6.2 (A) 01/30/2021   HGBA1C 6.3 (A) 08/02/2020   HGBA1C 6.0 (A) 02/02/2020   Lab Results  Component Value Date   MICROALBUR <0.7 02/02/2020   LDLCALC 76 11/01/2020   CREATININE 1.36 (H) 12/02/2020   HYPERTENSION: Followed by PCP with usually good control    BP Readings from Last 3 Encounters:  02/02/21 (!) 148/78  01/30/21 132/70  11/01/20 130/72   LIPIDS: She is taking 40 mg simvastatin from her PCP    Examination:   BP (!) 148/78    Pulse 74    Ht 5' 0.5" (1.537 m)    Wt 229 lb 9.6 oz (104.1 kg)    SpO2 99%    BMI 44.10 kg/m   Biceps reflexes do not show any delayed relaxation   Assessment/Plan:  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-131  Her thyroxine requirement previously has been stable at 75 mcg levothyroxine daily Now complaining of fatigue but she also had the same symptoms previously  Recently thyroid levels may be low  because of her misunderstanding the instructions for her thyroid regimen and instead of taking 7-1/2 tablets a week she is only taking half tablet daily  However exam is unremarkable We will recheck her thyroid levels In the meantime she will take 1 tablet daily of her levothyroxine 75 mcg daily  DIABETES with severe obesity:  Again  blood sugars are controlled Janumet  However she cannot afford branded medications such as GLP-1 drugs or SGLT2 drugs which would help her with weight loss Blood sugars at home are excellent also   Follow-up in 2 months    There are no Patient Instructions on file for this visit.  Elayne Snare 02/02/2021, 1:11 PM

## 2021-02-03 ENCOUNTER — Encounter: Payer: Self-pay | Admitting: Family Medicine

## 2021-02-03 ENCOUNTER — Other Ambulatory Visit: Payer: Self-pay | Admitting: Family Medicine

## 2021-02-03 LAB — TSH: TSH: 43 u[IU]/mL — ABNORMAL HIGH (ref 0.35–5.50)

## 2021-02-06 ENCOUNTER — Telehealth: Payer: Self-pay | Admitting: Endocrinology

## 2021-02-06 ENCOUNTER — Other Ambulatory Visit: Payer: Self-pay | Admitting: Family Medicine

## 2021-02-06 DIAGNOSIS — E669 Obesity, unspecified: Secondary | ICD-10-CM

## 2021-02-06 DIAGNOSIS — E1169 Type 2 diabetes mellitus with other specified complication: Secondary | ICD-10-CM

## 2021-02-06 NOTE — Telephone Encounter (Signed)
levothyroxine (SYNTHROID) 75 MCG tablet Patient called and requesting dosage instruction on the above. Please call patient at [phone].

## 2021-02-06 NOTE — Telephone Encounter (Signed)
Called patient and informed her that she is to take 71/2 tablets weekly. She also inquired about her lab results as well. Pls advise

## 2021-02-08 ENCOUNTER — Other Ambulatory Visit: Payer: Self-pay | Admitting: Family Medicine

## 2021-02-11 ENCOUNTER — Other Ambulatory Visit: Payer: Self-pay | Admitting: Endocrinology

## 2021-02-17 ENCOUNTER — Encounter: Payer: Self-pay | Admitting: Endocrinology

## 2021-02-17 ENCOUNTER — Encounter: Payer: Self-pay | Admitting: Family Medicine

## 2021-03-02 ENCOUNTER — Encounter: Payer: Self-pay | Admitting: Cardiology

## 2021-03-02 ENCOUNTER — Ambulatory Visit (INDEPENDENT_AMBULATORY_CARE_PROVIDER_SITE_OTHER): Payer: Medicare Other | Admitting: Cardiology

## 2021-03-02 VITALS — BP 118/70 | HR 76 | Ht 65.5 in | Wt 223.0 lb

## 2021-03-02 DIAGNOSIS — Z01810 Encounter for preprocedural cardiovascular examination: Secondary | ICD-10-CM | POA: Diagnosis not present

## 2021-03-02 NOTE — Patient Instructions (Signed)
Medication Instructions:  Continue all current medications.  Labwork: none  Testing/Procedures: none  Follow-Up: As needed.    Any Other Special Instructions Will Be Listed Below (If Applicable).  If you need a refill on your cardiac medications before your next appointment, please call your pharmacy.  

## 2021-03-02 NOTE — Addendum Note (Signed)
Addended by: Sung Amabile on: 03/02/2021 03:57 PM   Modules accepted: Orders

## 2021-03-02 NOTE — Progress Notes (Signed)
Clinical Summary Ms. Purdon is a 81 y.o.female seen as a new consult, referred by Dr Elease Hashimoto for the following medical problems.  1.Preoperative evaluation - considering right knee replacement - no recent chest pain. Occasional SOB   - had been swimming in summer. Would swim 18 laps continous without exertional symptoms.  - stationary bike x 30 minutes to 1 hours, no exertional symptoms.  - walks up flight of stairs at home without symptoms.   2013 echo [facility]: LVEF 60-65%, no WMAs, no significant valve pathology 2013 nuclear stress [facility]: no ischemia   2. HTN - compliant with meds   3. Hyperlipidemia - labs followed by pcp - she is on crestor  Past Medical History:  Diagnosis Date   Allergy    Anxiety    Arthritis    Asthma    patient denies    Cataract    bilateral lens implants    Colon polyps    Depression    Diabetes mellitus    Type two   Diverticulitis    Double vision 2019   left eye   Family history of adverse reaction to anesthesia    daughter- N/V    Family history of ovarian cancer    Family history of pancreatic cancer    Frequency of urination    GERD (gastroesophageal reflux disease)    History of hiatal hernia    Hyperglycemia    Hyperlipidemia    Hypertension    Hyperthyroidism    PT HAD BIPOSY -NEGATIVE.Marland Kitchen AND NOW JUST GETS CHECKED EACH YEAR .Marland Kitchen 2013 LAST TEST   SEES DR. [name].    Multiple meningiomas of spine and brain ([place])    Obstructive sleep apnea    STUDY [facility] DOES NOT USE C PAP   Palpitations    Peptic ulcer disease    Pneumonia    "walking pneumonia" hx of   Seizures ([place]) 2015   one siezure with brain surgery none since 2015    Sleep apnea    no CPAP   Urinary tract infection 03/2020     Allergies  Allergen Reactions   Flagyl [Metronidazole] Other (See Comments)    Caused increased heart rate   Aspirin Effervescent Swelling    Alka-Seltzer gel caps- sore mouth, face swollen,  turned hands white   Morphine Sulfate Hives    Hallucinations   Penicillins Swelling and Rash    Has patient had a PCN reaction causing immediate rash, facial/tongue/throat swelling, SOB or lightheadedness with hypotension: Yes Has patient had a PCN reaction causing severe rash involving mucus membranes or skin necrosis: No Has patient had a PCN reaction that required hospitalization No Has patient had a PCN reaction occurring within the last 10 years: No If all of the above answers are "NO", then may proceed with Cephalosporin use.      Current Outpatient Medications  Medication Sig Dispense Refill   ACCU-CHEK AVIVA PLUS test strip TEST BLOOD SUGAR TWICE DAILY. 50 strip 0   Accu-Chek Softclix Lancets lancets TEST BLOOD SUGAR TWICE DAILY. 100 each 0   acetaminophen (TYLENOL) 650 MG CR tablet Take 1,300 mg by mouth every 8 (eight) hours as needed for pain.     amLODipine (NORVASC) 5 MG tablet TAKE ONE (1) TABLET EACH DAY 90 tablet 1   ascorbic acid (VITAMIN C) 500 MG tablet Take 500 mg by mouth daily.     betamethasone dipropionate 0.05 % cream APPLY TO LEGS AS DIRECTED  45 g 0   cetirizine (ZYRTEC) 10 MG tablet Take 10 mg by mouth daily as needed for allergies.      Cyanocobalamin (VITAMIN B12 PO) Take 1 mL by mouth daily. OTC     furosemide (LASIX) 40 MG tablet Take 0.5 tablets (20 mg total) by mouth daily as needed. For fluid 30 tablet 3   Iron-Vitamins (GERITOL COMPLETE PO) Take 1 tablet by mouth daily.     JANUMET XR [phone] MG TB24 TAKE 1 TABLET DAILY AT LUNCH 90 tablet 1   Lancet Devices (ACCU-CHEK SOFTCLIX) lancets 1 each by Other route 2 (two) times daily. E11.9 100 each 3   levothyroxine (SYNTHROID) 75 MCG tablet 1 tablet before breakfast daily with extra half tablet once a week 90 tablet 1   losartan-hydrochlorothiazide (HYZAAR) 100-12.5 MG tablet TAKE ONE (1) TABLET EACH DAY 90 tablet 3   Magnesium 250 MG TABS Take 250 mg by mouth daily.     meclizine (ANTIVERT) 25 MG tablet  Take 25 mg by mouth 3 (three) times daily as needed for dizziness.      metoprolol succinate (TOPROL-XL) 50 MG 24 hr tablet Take 1 tablet (50 mg total) by mouth daily. Take with or immediately following a meal. 90 tablet 3   OVER THE COUNTER MEDICATION Apply 1 application topically daily as needed (pain). Oxyrub otc pain relieving gel     potassium chloride (KLOR-CON) 10 MEQ tablet TAKE ONE (1) TABLET EACH DAY 90 tablet 1   Propylene Glycol (SYSTANE BALANCE) 0.6 % SOLN Place 1 drop into both eyes daily.     RABEprazole (ACIPHEX) 20 MG tablet TAKE ONE (1) TABLET EACH DAY 90 tablet 1   rosuvastatin (CRESTOR) 40 MG tablet TAKE ONE (1) TABLET EACH DAY 90 tablet 2   solifenacin (VESICARE) 5 MG tablet TAKE ONE (1) TABLET EACH DAY (Patient taking differently: Take 5 mg by mouth daily.) 30 tablet 1   valACYclovir (VALTREX) 1000 MG tablet TAKE 2 TABS TWICE DAILY FOR 1 DAY THEN AS NEEDED 30 tablet 1   No current facility-administered medications for this visit.     Past Surgical History:  Procedure Laterality Date   ABDOMINAL HYSTERECTOMY     BACK SURGERY     L SPIN 2003   NECK 2004   BRAIN MENINGIOMA EXCISION  8/10   X 2   C5-C6 neck fusion     COLONOSCOPY  05/05/2009   CRANIOTOMY  08/13/2011   Procedure: CRANIOTOMY TUMOR EXCISION;  Surgeon: [name], MD;  Location: [facility];  Service: Neurosurgery;  Laterality: Bilateral;  Bifrontal craniotomy for tumor   DILATION AND CURETTAGE OF UTERUS     YEARS AGO   L4-L5 posterior la     left foot plantar and hammertoe     right foot bunectomy     right knee arthroscopy     x 3   right shoulder rotator cuff     ROBOTIC ASSISTED BILATERAL SALPINGO OOPHERECTOMY N/A 08/25/2015   Procedure: XI ROBOTIC ASSISTED BILATERAL SALPINGO OOPHORECTOMY;  Surgeon: [name], MD;  Location: [facility];  Service: Gynecology;  Laterality: N/A;   TONSILLECTOMY     TUBAL LIGATION     WRIST SURGERY Right      Allergies  Allergen Reactions   Flagyl  [Metronidazole] Other (See Comments)    Caused increased heart rate   Aspirin Effervescent Swelling    Alka-Seltzer gel caps- sore mouth, face swollen, turned hands white   Morphine Sulfate Hives  Hallucinations   Penicillins Swelling and Rash    Has patient had a PCN reaction causing immediate rash, facial/tongue/throat swelling, SOB or lightheadedness with hypotension: Yes Has patient had a PCN reaction causing severe rash involving mucus membranes or skin necrosis: No Has patient had a PCN reaction that required hospitalization No Has patient had a PCN reaction occurring within the last 10 years: No If all of the above answers are "NO", then may proceed with Cephalosporin use.       Family History  Problem Relation Age of Onset   Ovarian cancer Mother    Heart disease Father 1   Pancreatic cancer Maternal Aunt    Diabetes Maternal Grandmother    Anesthesia problems Daughter        NAUSEA AND VOMITING POST OP   Arthritis Other    Hyperlipidemia Other    Hypertension Other    Diabetes Other    Breast cancer Neg Hx    Colon cancer Neg Hx    Colon polyps Neg Hx    Esophageal cancer Neg Hx    Stomach cancer Neg Hx    Rectal cancer Neg Hx      Social History Ms. Templeman reports that she has never smoked. She has never used smokeless tobacco. Ms. Ribaudo reports no history of alcohol use.   Review of Systems CONSTITUTIONAL: No weight loss, fever, chills, weakness or fatigue.  HEENT: Eyes: No visual loss, blurred vision, double vision or yellow sclerae.No hearing loss, sneezing, congestion, runny nose or sore throat.  SKIN: No rash or itching.  CARDIOVASCULAR: per hpi RESPIRATORY: No shortness of breath, cough or sputum.  GASTROINTESTINAL: No anorexia, nausea, vomiting or diarrhea. No abdominal pain or blood.  GENITOURINARY: No burning on urination, no polyuria NEUROLOGICAL: No headache, dizziness, syncope, paralysis, ataxia, numbness or tingling in the extremities. No  change in bowel or bladder control.  MUSCULOSKELETAL: No muscle, back pain, joint pain or stiffness.  LYMPHATICS: No enlarged nodes. No history of splenectomy.  PSYCHIATRIC: No history of depression or anxiety.  ENDOCRINOLOGIC: No reports of sweating, cold or heat intolerance. No polyuria or polydipsia.  Marland Kitchen   Physical Examination Today's Vitals   03/02/21 1043 03/02/21 1112  BP: 96/60 118/70  Pulse: 76   SpO2: 98%   Weight: 223 lb (101.2 kg)   Height: 5' 5.5" (1.664 m)    Body mass index is 36.54 kg/m.   Gen: resting comfortably, no acute distress HEENT: no scleral icterus, pupils equal round and reactive, no palptable cervical adenopathy,  CV: RRR, no m/r/g no jvd Resp: Clear to auscultation bilaterally GI: abdomen is soft, non-tender, non-distended, normal bowel sounds, no hepatosplenomegaly MSK: extremities are warm, no edema.  Skin: warm, no rash Neuro:  no focal deficits Psych: appropriate affect      Assessment and Plan  1.Preoperative risk assessment - considering knee replacement.  - no active cardiac conditions or symptoms - she tolerates greater than 4 METs on a regular basis without exertional symptoms - recommend proceeding with surgery as planned, no prior cardiac testing or interventions are indicated.  - EKG today shows NSR, no ischemic changes     Arnoldo Lenis, M.D.

## 2021-03-24 ENCOUNTER — Other Ambulatory Visit: Payer: Self-pay | Admitting: Neurosurgery

## 2021-03-31 ENCOUNTER — Encounter: Payer: Self-pay | Admitting: Endocrinology

## 2021-03-31 ENCOUNTER — Ambulatory Visit (INDEPENDENT_AMBULATORY_CARE_PROVIDER_SITE_OTHER): Payer: Medicare Other | Admitting: Endocrinology

## 2021-03-31 ENCOUNTER — Other Ambulatory Visit: Payer: Self-pay

## 2021-03-31 VITALS — BP 114/70 | HR 61 | Ht 65.0 in | Wt 223.6 lb

## 2021-03-31 DIAGNOSIS — E890 Postprocedural hypothyroidism: Secondary | ICD-10-CM | POA: Diagnosis not present

## 2021-03-31 DIAGNOSIS — E669 Obesity, unspecified: Secondary | ICD-10-CM | POA: Diagnosis not present

## 2021-03-31 DIAGNOSIS — E1169 Type 2 diabetes mellitus with other specified complication: Secondary | ICD-10-CM | POA: Diagnosis not present

## 2021-03-31 LAB — T4, FREE: Free T4: 1.32 ng/dL (ref 0.60–1.60)

## 2021-03-31 LAB — GLUCOSE, RANDOM: Glucose, Bld: 120 mg/dL — ABNORMAL HIGH (ref 70–99)

## 2021-03-31 LAB — TSH: TSH: 2.94 u[IU]/mL (ref 0.35–5.50)

## 2021-03-31 NOTE — Progress Notes (Signed)
Please call to let patient know that the lab results are normal and no further action needed

## 2021-03-31 NOTE — Progress Notes (Signed)
Patient ID: Dawn Salas, female   DOB: [birth year], 81 y.o.   MRN: [id number]                                                                                                                  Chief complaint: Endocrinology follow-up   History of Present Illness:   History obtained on initial consultation:  She was diagnosed as having Hyperthyroidism in 2015 when she was having palpitations and was seen to have a goiter. At that time she was having the usual symptoms of fatigue, shakiness, palpitations, heat intolerance and some weight loss No details of this are available She was treated with I-131, unknown dose which was prescribed in March 2015 Apparently she had a large hot nodule on the left, however details of her treatment are not available  Apparently subsequently her hyperthyroidism recurred and methimazole restarted ?  In 4/17 Also the dose was increased up to 7.5 mg in 10/2015  Apparently prior to her being treated for the hot nodule she had a multinodular goiter evaluated with a fine-needle aspiration in 09/2010  Since she was significantly hyperthyroid on her initial exam in February and free T3 was 5.0 and was treated with methimazole Because of persistent hyperthyroidism and very high thyrotropin receptor antibody she was given I-131 treatment with 20.3 mCi on 06/04/16  RECENT history:  She has been HYPOTHYROID as of 07/2016 with a low free T4 level  She was also having some fatigue and cold sensitivity as well as the muscle cramps With starting levothyroxine 125 g she felt better, however subsequently TSH was low again the dose was reduced progressively  She is taking 75g of levothyroxine 7 1/2 tablets weekly since her last visit in 1/23 At that time she was only taking half a tablet daily by mistake She was also complaining of increasing fatigue She may feel cold/hot at times  Since her last visit her energy level has improved although she gets out of breath on  exertion Weight is down 6 pounds  She has taken her levothyroxine before breakfast daily and has not missed any doses   She takes her Geritol with iron after breakfast half an hour later  Wt Readings from Last 3 Encounters:  03/31/21 223 lb 9.6 oz (101.4 kg)  03/02/21 223 lb (101.2 kg)  02/02/21 229 lb 9.6 oz (104.1 kg)     Thyroid function tests as follows:     Lab Results  Component Value Date   TSH 43.00 verified by dilution (H) 02/02/2021   TSH 5.18 08/02/2020   TSH 3.76 02/02/2020   FREET4 0.71 02/02/2021   FREET4 1.02 08/02/2020   FREET4 1.03 02/02/2020   Ophthalmopathy:  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 with wearing prism lenses Has some tearing of the eyes and has been given drops by the ophthalmologist She is followed by ophthalmologist regularly Previous MRI had shown mild thickening of the rectus muscle on the left in 2018  DIABETES: See review of systems  Allergies as of 03/31/2021       Reactions   Flagyl [metronidazole] Other (See Comments)   Caused increased heart rate   Aspirin Effervescent Swelling   Alka-Seltzer gel caps- sore mouth, face swollen, turned hands white   Morphine Sulfate Hives   Hallucinations   Penicillins Swelling, Rash   Has patient had a PCN reaction causing immediate rash, facial/tongue/throat swelling, SOB or lightheadedness with hypotension: Yes Has patient had a PCN reaction causing severe rash involving mucus membranes or skin necrosis: No Has patient had a PCN reaction that required hospitalization No Has patient had a PCN reaction occurring within the last 10 years: No If all of the above answers are "NO", then may proceed with Cephalosporin use.        Medication List        Accurate as of March 31, 2021  9:39 AM. If you have any questions, ask your nurse or doctor.          Accu-Chek Aviva Plus test strip Generic drug: glucose blood TEST BLOOD SUGAR TWICE DAILY.   accu-chek softclix  lancets 1 each by Other route 2 (two) times daily. E11.9   Accu-Chek Softclix Lancets lancets TEST BLOOD SUGAR TWICE DAILY.   acetaminophen 650 MG CR tablet Commonly known as: TYLENOL Take 1,300 mg by mouth every 8 (eight) hours as needed for pain.   amLODipine 5 MG tablet Commonly known as: NORVASC TAKE ONE (1) TABLET EACH DAY   ascorbic acid 500 MG tablet Commonly known as: VITAMIN C Take 500 mg by mouth daily.   betamethasone dipropionate 0.05 % cream APPLY TO LEGS AS DIRECTED   cetirizine 10 MG tablet Commonly known as: ZYRTEC Take 10 mg by mouth daily as needed for allergies.   furosemide 40 MG tablet Commonly known as: LASIX Take 0.5 tablets (20 mg total) by mouth daily as needed. For fluid   GERITOL COMPLETE PO Take 1 tablet by mouth daily.   Janumet XR [id number] MG Tb24 Generic drug: SitaGLIPtin-MetFORMIN HCl TAKE 1 TABLET DAILY AT LUNCH   levothyroxine 75 MCG tablet Commonly known as: SYNTHROID 1 tablet before breakfast daily with extra half tablet once a week   losartan-hydrochlorothiazide 100-12.5 MG tablet Commonly known as: HYZAAR TAKE ONE (1) TABLET EACH DAY   Magnesium 250 MG Tabs Take 250 mg by mouth daily.   meclizine 25 MG tablet Commonly known as: ANTIVERT Take 25 mg by mouth 3 (three) times daily as needed for dizziness.   metoprolol succinate 50 MG 24 hr tablet Commonly known as: TOPROL-XL Take 1 tablet (50 mg total) by mouth daily. Take with or immediately following a meal.   OVER THE COUNTER MEDICATION Apply 1 application topically daily as needed (pain). Oxyrub otc pain relieving gel   potassium chloride 10 MEQ tablet Commonly known as: KLOR-CON TAKE ONE (1) TABLET EACH DAY   RABEprazole 20 MG tablet Commonly known as: ACIPHEX TAKE ONE (1) TABLET EACH DAY   rosuvastatin 40 MG tablet Commonly known as: CRESTOR TAKE ONE (1) TABLET EACH DAY   solifenacin 5 MG tablet Commonly known as: VESICARE TAKE ONE (1) TABLET EACH  DAY What changed: See the new instructions.   Systane Balance 0.6 % Soln Generic drug: Propylene Glycol Place 1 drop into both eyes daily.   valACYclovir 1000 MG tablet Commonly known as: VALTREX TAKE 2 TABS TWICE DAILY FOR 1 DAY THEN AS NEEDED   VITAMIN B12 PO Take 1 mL by mouth daily. OTC  Past Medical History:  Diagnosis Date   Allergy    Anxiety    Arthritis    Asthma    patient denies    Cataract    bilateral lens implants    Colon polyps    Depression    Diabetes mellitus    Type two   Diverticulitis    Double vision 2019   left eye   Family history of adverse reaction to anesthesia    daughter- N/V    Family history of ovarian cancer    Family history of pancreatic cancer    Frequency of urination    GERD (gastroesophageal reflux disease)    History of hiatal hernia    Hyperglycemia    Hyperlipidemia    Hypertension    Hyperthyroidism    PT HAD BIPOSY -NEGATIVE.Marland Kitchen AND NOW JUST GETS CHECKED EACH YEAR .Marland Kitchen 2013 LAST TEST   SEES DR. [name].    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 DOES NOT USE C PAP   Palpitations    Peptic ulcer disease    Pneumonia    "walking pneumonia" hx of   Seizures ([place]) 2015   one siezure with brain surgery none since 2015    Sleep apnea    no CPAP   Urinary tract infection 03/2020    Past Surgical History:  Procedure Laterality Date   ABDOMINAL HYSTERECTOMY     BACK SURGERY     L SPIN 2003   NECK 2004   BRAIN MENINGIOMA EXCISION  8/10   X 2   C5-C6 neck fusion     COLONOSCOPY  05/05/2009   CRANIOTOMY  08/13/2011   Procedure: CRANIOTOMY TUMOR EXCISION;  Surgeon: [name], MD;  Location: [facility];  Service: Neurosurgery;  Laterality: Bilateral;  Bifrontal craniotomy for tumor   DILATION AND CURETTAGE OF UTERUS     YEARS AGO   L4-L5 posterior la     left foot plantar and hammertoe     right foot bunectomy     right knee arthroscopy     x 3    right shoulder rotator cuff     ROBOTIC ASSISTED BILATERAL SALPINGO OOPHERECTOMY N/A 08/25/2015   Procedure: XI ROBOTIC ASSISTED BILATERAL SALPINGO OOPHORECTOMY;  Surgeon: [name], MD;  Location: [facility];  Service: Gynecology;  Laterality: N/A;   TONSILLECTOMY     TUBAL LIGATION     WRIST SURGERY Right     Family History  Problem Relation Age of Onset   Ovarian cancer Mother    Heart disease Father 58   Pancreatic cancer Maternal Aunt    Diabetes Maternal Grandmother    Anesthesia problems Daughter        NAUSEA AND VOMITING POST OP   Arthritis Other    Hyperlipidemia Other    Hypertension Other    Diabetes Other    Breast cancer Neg Hx    Colon cancer Neg Hx    Colon polyps Neg Hx    Esophageal cancer Neg Hx    Stomach cancer Neg Hx    Rectal cancer Neg Hx     Social History:  reports that she has never smoked. She has never used smokeless tobacco. She reports that she does not drink alcohol and does not use drugs.  Allergies:  Allergies  Allergen Reactions   Flagyl [Metronidazole] Other (See Comments)    Caused increased heart rate   Aspirin Effervescent  Swelling    Alka-Seltzer gel caps- sore mouth, face swollen, turned hands white   Morphine Sulfate Hives    Hallucinations   Penicillins Swelling and Rash    Has patient had a PCN reaction causing immediate rash, facial/tongue/throat swelling, SOB or lightheadedness with hypotension: Yes Has patient had a PCN reaction causing severe rash involving mucus membranes or skin necrosis: No Has patient had a PCN reaction that required hospitalization No Has patient had a PCN reaction occurring within the last 10 years: No If all of the above answers are "NO", then may proceed with Cephalosporin use.       Review of Systems    DIABETES   She has had longstanding diabetes, mostly treated with metformin  Since she had relatively higher A1c of 7% and increasing blood sugars she was switched from metformin to  Janumet in 6/21 She was also feeling shaky sometimes when taking metformin in the morning She has not been seen by dietitian  She takes JANUMET around noontime  She has fairly good control with A1c below 6.5 recently Usually tries to eat healthy meals but at breakfast she will sometimes eat fatty meats She thinks she had 1 episode of low sugar with symptoms and glucose was 72 At that time she had cereal that morning which she usually does not do so She is trying to do some exercise on her stationary bike    Blood sugars from review of the monitor:   PRE-MEAL Fasting Lunch Dinner Bedtime Overall  Glucose range: 93-137      Mean/median:     106   POST-MEAL PC Breakfast PC Lunch PC Dinner  Glucose range:  92 ?  Mean/median:      Previously  PRE-MEAL Fasting Lunch Dinner Bedtime Overall  Glucose range: 97-102   90-127   Mean     105 (30-day)     Wt Readings from Last 3 Encounters:  03/31/21 223 lb 9.6 oz (101.4 kg)  03/02/21 223 lb (101.2 kg)  02/02/21 229 lb 9.6 oz (104.1 kg)    Lab Results  Component Value Date   HGBA1C 6.2 (A) 01/30/2021   HGBA1C 6.3 (A) 08/02/2020   HGBA1C 6.0 (A) 02/02/2020   Lab Results  Component Value Date   MICROALBUR 1.2 02/02/2021   LDLCALC 76 11/01/2020   CREATININE 1.36 (H) 12/02/2020   HYPERTENSION: Followed by PCP with usually good control    BP Readings from Last 3 Encounters:  03/31/21 114/70  03/02/21 118/70  02/02/21 (!) 148/78   LIPIDS: She is taking 40 mg simvastatin from her PCP    Examination:   BP 114/70    Pulse 61    Ht '5\' 5"'$  (1.651 m)    Wt 223 lb 9.6 oz (101.4 kg)    SpO2 99%    BMI 37.21 kg/m      Assessment/Plan:  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-131  Her thyroxine requirement previously has been stable at 75 mcg levothyroxine  2 months ago she had significant hypothyroidism because of taking only half tablet daily by mistake  Overall she feels better but still has issues with  shortness of breath on exertion Weight is down from her last visit  Thyroid labs pending  DIABETES with severe obesity: She has hypertension, diabetes, dyslipidemia  Her blood sugars are controlled with only Janumet  She appears to have reactive hypoglycemia if she is eating high carbohydrate meals like cereal especially in the morning Discussed trying to avoid  and balanced meals especially at breakfast Also given her list of high saturated foods to avoid especially at breakfast because of her history of hypercholesterolemia and need to lose weight  Follow-up in 4 months if thyroid levels are normal    There are no Patient Instructions on file for this visit.  Elayne Snare 03/31/2021, 9:39 AM

## 2021-04-11 NOTE — Progress Notes (Signed)
Surgical Instructions ? ? ? Your procedure is scheduled on 04/19/21. ? Report to [facility] Main Entrance "A" at 10:15 A.M., then check in with the Admitting office. ? Call this number if you have problems the morning of surgery: ? [phone] ? ? If you have any questions prior to your surgery date call [phone]: Open Monday-Friday 8am-4pm ? ? ? Remember: ? Do not eat or drink after midnight the night before your surgery ? ? ?  ? Take these medicines the morning of surgery with A SIP OF WATER:  ?amLODipine (NORVASC)  ?levothyroxine (SYNTHROID) ?metoprolol succinate (TOPROL-XL) ?RABEprazole (ACIPHEX)  ?rosuvastatin (CRESTOR) ?solifenacin (VESICARE)  ? ?IF NEEDED: ?acetaminophen (TYLENOL) ?cetirizine (ZYRTEC) ?meclizine (ANTIVERT)  ?Propylene Glycol (SYSTANE BALANCE)  ?valACYclovir (VALTREX)  ? ?As of today, STOP taking any Aspirin (unless otherwise instructed by your surgeon) Aleve, Naproxen, Ibuprofen, Motrin, Advil, Goody's, BC's, all herbal medications, fish oil, and all vitamins. ? ?WHAT DO I DO ABOUT MY DIABETES MEDICATION? ? ? ?Do not take oral diabetes medicines (pills) the morning of surgery. ? ?THE MORNING OF SURGERY, do not take JANUMET XR. ? ?The day of surgery, do not take other diabetes injectables, including Byetta (exenatide), Bydureon (exenatide ER), Victoza (liraglutide), or Trulicity (dulaglutide). ? ?If your CBG is greater than 220 mg/dL, you may take ? of your sliding scale (correction) dose of insulin. ? ? ?HOW TO MANAGE YOUR DIABETES ?BEFORE AND AFTER SURGERY ? ?Why is it important to control my blood sugar before and after surgery? ?Improving blood sugar levels before and after surgery helps healing and can limit problems. ?A way of improving blood sugar control is eating a healthy diet by: ? Eating less sugar and carbohydrates ? Increasing activity/exercise ? Talking with your doctor about reaching your blood sugar goals ?High blood sugars (greater than 180 mg/dL) can raise your risk of  infections and slow your recovery, so you will need to focus on controlling your diabetes during the weeks before surgery. ?Make sure that the doctor who takes care of your diabetes knows about your planned surgery including the date and location. ? ?How do I manage my blood sugar before surgery? ?Check your blood sugar at least 4 times a day, starting 2 days before surgery, to make sure that the level is not too high or low. ? ?Check your blood sugar the morning of your surgery when you wake up and every 2 hours until you get to the Short Stay unit. ? ?If your blood sugar is less than 70 mg/dL, you will need to treat for low blood sugar: ?Do not take insulin. ?Treat a low blood sugar (less than 70 mg/dL) with ? cup of clear juice (cranberry or apple), 4 glucose tablets, OR glucose gel. ?Recheck blood sugar in 15 minutes after treatment (to make sure it is greater than 70 mg/dL). If your blood sugar is not greater than 70 mg/dL on recheck, call [phone] for further instructions. ?Report your blood sugar to the short stay nurse when you get to Short Stay. ? ?If you are admitted to the hospital after surgery: ?Your blood sugar will be checked by the staff and you will probably be given insulin after surgery (instead of oral diabetes medicines) to make sure you have good blood sugar levels. ?The goal for blood sugar control after surgery is 80-180 mg/dL. ?        ?Do not wear jewelry or makeup ?Do not wear lotions, powders, perfumes/colognes, or deodorant. ?Do not shave 48 hours prior to  surgery.   ?Do not bring valuables to the hospital. ?Do not wear nail polish, gel polish, artificial nails, or any other type of covering on natural nails (fingers and toes) ?If you have artificial nails or gel coating that need to be removed by a nail salon, please have this removed prior to surgery. Artificial nails or gel coating may interfere with anesthesia's ability to adequately monitor your vital signs. ? ?[facility] is  not responsible for any belongings or valuables. .  ? ?Do NOT Smoke (Tobacco/Vaping)  24 hours prior to your procedure ? ?If you use a CPAP at night, you may bring your mask for your overnight stay. ?  ?Contacts, glasses, hearing aids, dentures or partials may not be worn into surgery, please bring cases for these belongings ?  ?For patients admitted to the hospital, discharge time will be determined by your treatment team. ?  ?Patients discharged the day of surgery will not be allowed to drive home, and someone needs to stay with them for 24 hours. ? ?NO VISITORS WILL BE ALLOWED IN PRE-OP WHERE PATIENTS ARE PREPPED FOR SURGERY.  ONLY 1 SUPPORT PERSON MAY BE PRESENT IN THE WAITING ROOM WHILE YOU ARE IN SURGERY.  IF YOU ARE TO BE ADMITTED, ONCE YOU ARE IN YOUR ROOM YOU WILL BE ALLOWED TWO (2) VISITORS. 1 (ONE) VISITOR MAY STAY OVERNIGHT BUT MUST ARRIVE TO THE ROOM BY 8pm.  Minor children may have two parents present. Special consideration for safety and communication needs will be reviewed on a case by case basis. ? ?Special instructions:   ? ?Oral Hygiene is also important to reduce your risk of infection.  Remember - BRUSH YOUR TEETH THE MORNING OF SURGERY WITH YOUR REGULAR TOOTHPASTE ? ? ?[place]- Preparing For Surgery ? ?Before surgery, you can play an important role. Because skin is not sterile, your skin needs to be as free of germs as possible. You can reduce the number of germs on your skin by washing with CHG (chlorahexidine gluconate) Soap before surgery.  CHG is an antiseptic cleaner which kills germs and bonds with the skin to continue killing germs even after washing.   ? ? ?Please do not use if you have an allergy to CHG or antibacterial soaps. If your skin becomes reddened/irritated stop using the CHG.  ?Do not shave (including legs and underarms) for at least 48 hours prior to first CHG shower. It is OK to shave your face. ? ?Please follow these instructions carefully. ?  ? ? Shower the NIGHT  BEFORE SURGERY and the MORNING OF SURGERY with CHG Soap.  ? If you chose to wash your hair, wash your hair first as usual with your normal shampoo. After you shampoo, rinse your hair and body thoroughly to remove the shampoo.  Then [employer] and genitals (private parts) with your normal soap and rinse thoroughly to remove soap. ? ?After that Use CHG Soap as you would any other liquid soap. You can apply CHG directly to the skin and wash gently with a scrungie or a clean washcloth.  ? ?Apply the CHG Soap to your body ONLY FROM THE NECK DOWN.  Do not use on open wounds or open sores. Avoid contact with your eyes, ears, mouth and genitals (private parts). Wash Face and genitals (private parts)  with your normal soap.  ? ?Wash thoroughly, paying special attention to the area where your surgery will be performed. ? ?Thoroughly rinse your body with warm water from the neck down. ? ?  DO NOT shower/wash with your normal soap after using and rinsing off the CHG Soap. ? ?Pat yourself dry with a CLEAN TOWEL. ? ?Wear CLEAN PAJAMAS to bed the night before surgery ? ?Place CLEAN SHEETS on your bed the night before your surgery ? ?DO NOT SLEEP WITH PETS. ? ? ?Day of Surgery: ?Take a shower with CHG soap. ?Wear Clean/Comfortable clothing the morning of surgery ?Do not apply any deodorants/lotions.   ?Remember to brush your teeth WITH YOUR REGULAR TOOTHPASTE. ? ? ? ?COVID testing ? ?If you are going to stay overnight or be admitted after your procedure/surgery and require a pre-op COVID test, please follow these instructions after your COVID test  ? ?You are not required to quarantine however you are required to wear a well-fitting mask when you are out and around people not in your household.  If your mask becomes wet or soiled, replace with a new one. ? ?Wash your hands often with soap and water for 20 seconds or clean your hands with an alcohol-based hand sanitizer that contains at least 60% alcohol. ? ?Do not share personal  items. ? ?Notify your provider: ?if you are in close contact with someone who has COVID  ?or if you develop a fever of 100.4 or greater, sneezing, cough, sore throat, shortness of breath or body aches. ? ?  ?Plea

## 2021-04-12 ENCOUNTER — Encounter (HOSPITAL_COMMUNITY)
Admission: RE | Admit: 2021-04-12 | Discharge: 2021-04-12 | Disposition: A | Discharge status: Home / Self Care | Payer: Medicare Other | Source: Ambulatory Visit | Attending: Neurosurgery | Admitting: Neurosurgery

## 2021-04-12 ENCOUNTER — Other Ambulatory Visit: Payer: Self-pay

## 2021-04-12 ENCOUNTER — Encounter (HOSPITAL_COMMUNITY): Payer: Self-pay

## 2021-04-12 VITALS — BP 137/72 | HR 75 | Temp 98.0°F | Resp 17 | Ht 65.0 in | Wt 226.1 lb

## 2021-04-12 DIAGNOSIS — I10 Essential (primary) hypertension: Secondary | ICD-10-CM | POA: Diagnosis not present

## 2021-04-12 DIAGNOSIS — Z01812 Encounter for preprocedural laboratory examination: Secondary | ICD-10-CM | POA: Insufficient documentation

## 2021-04-12 DIAGNOSIS — Z01818 Encounter for other preprocedural examination: Secondary | ICD-10-CM

## 2021-04-12 DIAGNOSIS — E119 Type 2 diabetes mellitus without complications: Secondary | ICD-10-CM | POA: Diagnosis not present

## 2021-04-12 DIAGNOSIS — E890 Postprocedural hypothyroidism: Secondary | ICD-10-CM

## 2021-04-12 LAB — BASIC METABOLIC PANEL
Anion gap: 8 (ref 5–15)
BUN: 18 mg/dL (ref 8–23)
CO2: 28 mmol/L (ref 22–32)
Calcium: 9.2 mg/dL (ref 8.9–10.3)
Chloride: 104 mmol/L (ref 98–111)
Creatinine, Ser: 1.37 mg/dL — ABNORMAL HIGH (ref 0.44–1.00)
GFR, Estimated: 40 mL/min — ABNORMAL LOW (ref >60)
Glucose, Bld: 95 mg/dL (ref 70–99)
Potassium: 4.1 mmol/L (ref 3.5–5.1)
Sodium: 140 mmol/L (ref 135–145)

## 2021-04-12 LAB — CBC
HCT: 38.6 % (ref 36.0–46.0)
Hemoglobin: 12.4 g/dL (ref 12.0–15.0)
MCH: 26.7 pg (ref 26.0–34.0)
MCHC: 32.1 g/dL (ref 30.0–36.0)
MCV: 83 fL (ref 80.0–100.0)
Platelets: 164 10*3/uL (ref 150–400)
RBC: 4.65 MIL/uL (ref 3.87–5.11)
RDW: 14.7 % (ref 11.5–15.5)
WBC: 5.7 10*3/uL (ref 4.0–10.5)
nRBC: 0 % (ref 0.0–0.2)

## 2021-04-12 LAB — TYPE AND SCREEN
ABO/RH(D): O POS
Antibody Screen: NEGATIVE

## 2021-04-12 LAB — HEMOGLOBIN A1C
Hgb A1c MFr Bld: 6.5 % — ABNORMAL HIGH (ref 4.8–5.6)
Mean Plasma Glucose: 139.85 mg/dL

## 2021-04-12 LAB — GLUCOSE, CAPILLARY: Glucose-Capillary: 109 mg/dL — ABNORMAL HIGH (ref 70–99)

## 2021-04-12 LAB — SURGICAL PCR SCREEN
MRSA, PCR: NEGATIVE
Staphylococcus aureus: NEGATIVE

## 2021-04-12 NOTE — Progress Notes (Signed)
PCP - Dr. Carolann Littler ? ?Cardiologist - Pt does not routinely see cardiology. She saw Dr. Carlyle Dolly for pre-op clearance for her knee surgery coming up in May as per recommendation according to pt. No cardiac issues. No abnormalities per note ? ?PPM/ICD - denies ? ? ?Chest x-ray - 07/11/17 ?EKG - 03/02/21 ?Stress Test - 06/28/11 ?ECHO - 06/20/11 ?Cardiac Cath - denies ? ?Sleep Study - 09/05/2005 OSA+ ?CPAP - denies ? ?DM- Type 2 ?Fasting Blood Sugar - 110-120 ?Checks Blood Sugar twice a day ? ?ASA/Blood Thinner Instructions: n/a ? ? ?ERAS Protcol - No, NPO ? ? ?COVID TEST- n/a ? ? ?Anesthesia review: no ? ?Patient denies shortness of breath, fever, cough and chest pain at PAT appointment ? ? ?All instructions explained to the patient, with a verbal understanding of the material. Patient agrees to go over the instructions while at home for a better understanding. Patient also instructed to wear a mask in public for 3 days prior to surgery. The opportunity to ask questions was provided. ?  ?

## 2021-04-14 ENCOUNTER — Other Ambulatory Visit: Payer: Self-pay | Admitting: Family Medicine

## 2021-04-14 DIAGNOSIS — E7800 Pure hypercholesterolemia, unspecified: Secondary | ICD-10-CM

## 2021-04-17 ENCOUNTER — Other Ambulatory Visit: Payer: Self-pay | Admitting: Neurosurgery

## 2021-04-19 ENCOUNTER — Inpatient Hospital Stay (HOSPITAL_COMMUNITY): Payer: Medicare Other | Admitting: Vascular Surgery

## 2021-04-19 ENCOUNTER — Other Ambulatory Visit: Payer: Self-pay

## 2021-04-19 ENCOUNTER — Inpatient Hospital Stay (HOSPITAL_COMMUNITY)
Admission: RE | Disposition: A | Discharge status: Home / Self Care | Payer: Self-pay | Source: Ambulatory Visit | Attending: Neurosurgery

## 2021-04-19 ENCOUNTER — Telehealth: Payer: Self-pay

## 2021-04-19 ENCOUNTER — Inpatient Hospital Stay (HOSPITAL_COMMUNITY): Payer: Medicare Other

## 2021-04-19 ENCOUNTER — Inpatient Hospital Stay (HOSPITAL_COMMUNITY)
Admission: RE | Admit: 2021-04-19 | Discharge: 2021-04-21 | DRG: 472 | Disposition: A | Discharge status: Home / Self Care | Payer: Medicare Other | Source: Ambulatory Visit | Attending: Neurosurgery | Admitting: Neurosurgery

## 2021-04-19 ENCOUNTER — Inpatient Hospital Stay (HOSPITAL_COMMUNITY): Payer: Medicare Other | Admitting: Certified Registered"

## 2021-04-19 ENCOUNTER — Encounter (HOSPITAL_COMMUNITY): Payer: Self-pay | Admitting: Neurosurgery

## 2021-04-19 DIAGNOSIS — K219 Gastro-esophageal reflux disease without esophagitis: Secondary | ICD-10-CM | POA: Diagnosis present

## 2021-04-19 DIAGNOSIS — Z8041 Family history of malignant neoplasm of ovary: Secondary | ICD-10-CM | POA: Diagnosis not present

## 2021-04-19 DIAGNOSIS — Z90710 Acquired absence of both cervix and uterus: Secondary | ICD-10-CM | POA: Diagnosis not present

## 2021-04-19 DIAGNOSIS — M4712 Other spondylosis with myelopathy, cervical region: Secondary | ICD-10-CM

## 2021-04-19 DIAGNOSIS — M542 Cervicalgia: Secondary | ICD-10-CM | POA: Diagnosis present

## 2021-04-19 DIAGNOSIS — Z79890 Hormone replacement therapy (postmenopausal): Secondary | ICD-10-CM | POA: Diagnosis not present

## 2021-04-19 DIAGNOSIS — Z79899 Other long term (current) drug therapy: Secondary | ICD-10-CM

## 2021-04-19 DIAGNOSIS — G4733 Obstructive sleep apnea (adult) (pediatric): Secondary | ICD-10-CM | POA: Diagnosis present

## 2021-04-19 DIAGNOSIS — M4722 Other spondylosis with radiculopathy, cervical region: Secondary | ICD-10-CM | POA: Diagnosis present

## 2021-04-19 DIAGNOSIS — Z6837 Body mass index (BMI) 37.0-37.9, adult: Secondary | ICD-10-CM | POA: Diagnosis not present

## 2021-04-19 DIAGNOSIS — M4802 Spinal stenosis, cervical region: Secondary | ICD-10-CM | POA: Diagnosis present

## 2021-04-19 DIAGNOSIS — Z8249 Family history of ischemic heart disease and other diseases of the circulatory system: Secondary | ICD-10-CM | POA: Diagnosis not present

## 2021-04-19 DIAGNOSIS — Z91199 Patient's noncompliance with other medical treatment and regimen due to unspecified reason: Secondary | ICD-10-CM | POA: Diagnosis not present

## 2021-04-19 DIAGNOSIS — Z833 Family history of diabetes mellitus: Secondary | ICD-10-CM

## 2021-04-19 DIAGNOSIS — E669 Obesity, unspecified: Secondary | ICD-10-CM | POA: Diagnosis present

## 2021-04-19 DIAGNOSIS — Z7984 Long term (current) use of oral hypoglycemic drugs: Secondary | ICD-10-CM | POA: Diagnosis not present

## 2021-04-19 DIAGNOSIS — Z800 Family history of malignant neoplasm of digestive organs: Secondary | ICD-10-CM | POA: Diagnosis not present

## 2021-04-19 DIAGNOSIS — E0590 Thyrotoxicosis, unspecified without thyrotoxic crisis or storm: Secondary | ICD-10-CM | POA: Diagnosis present

## 2021-04-19 DIAGNOSIS — E785 Hyperlipidemia, unspecified: Secondary | ICD-10-CM | POA: Diagnosis present

## 2021-04-19 HISTORY — PX: ANTERIOR CERVICAL DECOMP/DISCECTOMY FUSION: SHX1161

## 2021-04-19 LAB — GLUCOSE, CAPILLARY
Glucose-Capillary: 100 mg/dL — ABNORMAL HIGH (ref 70–99)
Glucose-Capillary: 107 mg/dL — ABNORMAL HIGH (ref 70–99)
Glucose-Capillary: 131 mg/dL — ABNORMAL HIGH (ref 70–99)
Glucose-Capillary: 172 mg/dL — ABNORMAL HIGH (ref 70–99)
Glucose-Capillary: 221 mg/dL — ABNORMAL HIGH (ref 70–99)

## 2021-04-19 SURGERY — ANTERIOR CERVICAL DECOMPRESSION/DISCECTOMY FUSION 2 LEVEL/HARDWARE REMOVAL
Anesthesia: General

## 2021-04-19 MED ORDER — METOPROLOL SUCCINATE ER 50 MG PO TB24
50.0000 mg | ORAL_TABLET | Freq: Every day | ORAL | Status: DC
  Administered 2021-04-20 – 2021-04-21 (×2): 50 mg via ORAL
  Filled 2021-04-19 (×2): qty 1

## 2021-04-19 MED ORDER — ACETAMINOPHEN 325 MG PO TABS
650.0000 mg | ORAL_TABLET | ORAL | Status: DC | PRN
  Administered 2021-04-20 – 2021-04-21 (×2): 650 mg via ORAL
  Filled 2021-04-19 (×2): qty 2

## 2021-04-19 MED ORDER — LABETALOL HCL 5 MG/ML IV SOLN
INTRAVENOUS | Status: AC
  Filled 2021-04-19: qty 4

## 2021-04-19 MED ORDER — CHLORHEXIDINE GLUCONATE CLOTH 2 % EX PADS: 6.0000 | MEDICATED_PAD | Freq: Once | CUTANEOUS | Status: DC

## 2021-04-19 MED ORDER — AMLODIPINE BESYLATE 5 MG PO TABS
5.0000 mg | ORAL_TABLET | Freq: Every day | ORAL | Status: DC
  Administered 2021-04-20 – 2021-04-21 (×2): 5 mg via ORAL
  Filled 2021-04-19 (×2): qty 1

## 2021-04-19 MED ORDER — MAGNESIUM 250 MG PO TABS
250.0000 mg | ORAL_TABLET | Freq: Every day | ORAL | Status: DC
Start: 2021-04-19 — End: 2021-04-19

## 2021-04-19 MED ORDER — ONDANSETRON HCL 4 MG/2ML IJ SOLN: 4.0000 mg | Freq: Four times a day (QID) | INTRAMUSCULAR | Status: DC | PRN

## 2021-04-19 MED ORDER — PHENOL 1.4 % MT LIQD: 1.0000 | OROMUCOSAL | Status: DC | PRN

## 2021-04-19 MED ORDER — BUPIVACAINE-EPINEPHRINE 0.5% -1:200000 IJ SOLN
INTRAMUSCULAR | Status: AC
  Filled 2021-04-19: qty 1

## 2021-04-19 MED ORDER — CYCLOBENZAPRINE HCL 10 MG PO TABS
10.0000 mg | ORAL_TABLET | Freq: Three times a day (TID) | ORAL | Status: DC | PRN
  Administered 2021-04-19 – 2021-04-20 (×2): 10 mg via ORAL
  Filled 2021-04-19 (×2): qty 1

## 2021-04-19 MED ORDER — ALUM & MAG HYDROXIDE-SIMETH 200-200-20 MG/5ML PO SUSP: 30.0000 mL | Freq: Four times a day (QID) | ORAL | Status: DC | PRN

## 2021-04-19 MED ORDER — BACITRACIN ZINC 500 UNIT/GM EX OINT
TOPICAL_OINTMENT | CUTANEOUS | Status: DC | PRN
Start: 2021-04-19 — End: 2021-04-19
  Administered 2021-04-19: 1 via TOPICAL

## 2021-04-19 MED ORDER — PANTOPRAZOLE SODIUM 40 MG IV SOLR: 40.0000 mg | Freq: Every day | INTRAVENOUS | Status: DC

## 2021-04-19 MED ORDER — BUPIVACAINE-EPINEPHRINE 0.5% -1:200000 IJ SOLN
INTRAMUSCULAR | Status: DC | PRN
  Administered 2021-04-19: 10 mL

## 2021-04-19 MED ORDER — OXYCODONE HCL 5 MG/5ML PO SOLN: 5.0000 mg | Freq: Once | ORAL | Status: DC | PRN

## 2021-04-19 MED ORDER — CHLORHEXIDINE GLUCONATE 0.12 % MT SOLN
15.0000 mL | Freq: Once | OROMUCOSAL | Status: AC
  Administered 2021-04-19: 15 mL via OROMUCOSAL
  Filled 2021-04-19: qty 15

## 2021-04-19 MED ORDER — MENTHOL 3 MG MT LOZG: 1.0000 | LOZENGE | OROMUCOSAL | Status: DC | PRN

## 2021-04-19 MED ORDER — POTASSIUM CHLORIDE CRYS ER 10 MEQ PO TBCR
10.0000 meq | EXTENDED_RELEASE_TABLET | Freq: Every day | ORAL | Status: DC
  Administered 2021-04-20 – 2021-04-21 (×2): 10 meq via ORAL
  Filled 2021-04-19 (×2): qty 1

## 2021-04-19 MED ORDER — PANTOPRAZOLE SODIUM 40 MG PO TBEC: 40.0000 mg | DELAYED_RELEASE_TABLET | Freq: Every day | ORAL | Status: DC

## 2021-04-19 MED ORDER — DOCUSATE SODIUM 100 MG PO CAPS
100.0000 mg | ORAL_CAPSULE | Freq: Two times a day (BID) | ORAL | Status: DC
  Administered 2021-04-19 – 2021-04-21 (×4): 100 mg via ORAL
  Filled 2021-04-19 (×4): qty 1

## 2021-04-19 MED ORDER — LOSARTAN POTASSIUM 50 MG PO TABS
50.0000 mg | ORAL_TABLET | Freq: Every day | ORAL | Status: DC
  Administered 2021-04-19 – 2021-04-21 (×3): 50 mg via ORAL
  Filled 2021-04-19 (×3): qty 1

## 2021-04-19 MED ORDER — ONDANSETRON HCL 4 MG/2ML IJ SOLN: 4.0000 mg | Freq: Once | INTRAMUSCULAR | Status: DC | PRN

## 2021-04-19 MED ORDER — BACITRACIN ZINC 500 UNIT/GM EX OINT
TOPICAL_OINTMENT | CUTANEOUS | Status: AC
  Filled 2021-04-19: qty 28.35

## 2021-04-19 MED ORDER — LEVOTHYROXINE SODIUM 75 MCG PO TABS: 112.5000 ug | ORAL_TABLET | ORAL | Status: DC

## 2021-04-19 MED ORDER — PHENYLEPHRINE HCL (PRESSORS) 10 MG/ML IV SOLN
INTRAVENOUS | Status: DC | PRN
  Administered 2021-04-19 (×2): 80 ug via INTRAVENOUS

## 2021-04-19 MED ORDER — LACTATED RINGERS IV SOLN: INTRAVENOUS | Status: DC | PRN

## 2021-04-19 MED ORDER — LACTATED RINGERS IV SOLN: INTRAVENOUS | Status: DC

## 2021-04-19 MED ORDER — THROMBIN 5000 UNITS EX SOLR
CUTANEOUS | Status: AC
  Filled 2021-04-19: qty 5000

## 2021-04-19 MED ORDER — VANCOMYCIN HCL IN DEXTROSE 1-5 GM/200ML-% IV SOLN
INTRAVENOUS | Status: AC
  Administered 2021-04-19: 1000 mg via INTRAVENOUS
  Filled 2021-04-19: qty 200

## 2021-04-19 MED ORDER — OXYCODONE HCL 5 MG PO TABS: 5.0000 mg | ORAL_TABLET | Freq: Once | ORAL | Status: DC | PRN

## 2021-04-19 MED ORDER — LOSARTAN POTASSIUM 50 MG PO TABS: 100.0000 mg | ORAL_TABLET | Freq: Every day | ORAL | Status: DC

## 2021-04-19 MED ORDER — LORATADINE 10 MG PO TABS: 10.0000 mg | ORAL_TABLET | Freq: Every day | ORAL | Status: DC | PRN

## 2021-04-19 MED ORDER — LABETALOL HCL 5 MG/ML IV SOLN
5.0000 mg | INTRAVENOUS | Status: DC | PRN
  Administered 2021-04-19: 5 mg via INTRAVENOUS

## 2021-04-19 MED ORDER — SUGAMMADEX SODIUM 200 MG/2ML IV SOLN
INTRAVENOUS | Status: DC | PRN
  Administered 2021-04-19: 200 mg via INTRAVENOUS

## 2021-04-19 MED ORDER — ACETAMINOPHEN 650 MG RE SUPP: 650.0000 mg | RECTAL | Status: DC | PRN

## 2021-04-19 MED ORDER — SUFENTANIL CITRATE 50 MCG/ML IV SOLN
INTRAVENOUS | Status: DC | PRN
Start: 2021-04-19 — End: 2021-04-19
  Administered 2021-04-19 (×5): 10 ug via INTRAVENOUS

## 2021-04-19 MED ORDER — BISACODYL 10 MG RE SUPP: 10.0000 mg | Freq: Every day | RECTAL | Status: DC | PRN

## 2021-04-19 MED ORDER — VANCOMYCIN HCL IN DEXTROSE 1-5 GM/200ML-% IV SOLN: 1000.0000 mg | INTRAVENOUS | Status: AC

## 2021-04-19 MED ORDER — PHENYLEPHRINE HCL-NACL 20-0.9 MG/250ML-% IV SOLN
INTRAVENOUS | Status: DC | PRN
  Administered 2021-04-19: 50 ug/min via INTRAVENOUS

## 2021-04-19 MED ORDER — ACETAMINOPHEN 500 MG PO TABS
1000.0000 mg | ORAL_TABLET | Freq: Four times a day (QID) | ORAL | Status: AC
  Administered 2021-04-19 – 2021-04-20 (×4): 1000 mg via ORAL
  Filled 2021-04-19 (×4): qty 2

## 2021-04-19 MED ORDER — LIDOCAINE 2% (20 MG/ML) 5 ML SYRINGE
INTRAMUSCULAR | Status: DC | PRN
  Administered 2021-04-19: 60 mg via INTRAVENOUS

## 2021-04-19 MED ORDER — PROPOFOL 10 MG/ML IV BOLUS
INTRAVENOUS | Status: DC | PRN
  Administered 2021-04-19: 100 mg via INTRAVENOUS

## 2021-04-19 MED ORDER — HYDROCHLOROTHIAZIDE 12.5 MG PO TABS: 12.5000 mg | ORAL_TABLET | Freq: Every day | ORAL | Status: DC

## 2021-04-19 MED ORDER — OXYCODONE HCL 5 MG PO TABS
5.0000 mg | ORAL_TABLET | ORAL | Status: DC | PRN
  Administered 2021-04-20 – 2021-04-21 (×2): 5 mg via ORAL
  Filled 2021-04-19 (×2): qty 1

## 2021-04-19 MED ORDER — LOSARTAN POTASSIUM-HCTZ 100-12.5 MG PO TABS: 0.5000 | ORAL_TABLET | Freq: Every day | ORAL | Status: DC

## 2021-04-19 MED ORDER — PROPOFOL 10 MG/ML IV BOLUS
INTRAVENOUS | Status: AC
  Filled 2021-04-19: qty 20

## 2021-04-19 MED ORDER — LEVOTHYROXINE SODIUM 75 MCG PO TABS
75.0000 ug | ORAL_TABLET | ORAL | Status: DC
  Administered 2021-04-20 – 2021-04-21 (×2): 75 ug via ORAL
  Filled 2021-04-19 (×2): qty 1

## 2021-04-19 MED ORDER — OXYCODONE HCL 5 MG PO TABS
10.0000 mg | ORAL_TABLET | ORAL | Status: DC | PRN
  Administered 2021-04-19 – 2021-04-21 (×5): 10 mg via ORAL
  Filled 2021-04-19 (×5): qty 2

## 2021-04-19 MED ORDER — DARIFENACIN HYDROBROMIDE ER 7.5 MG PO TB24
7.5000 mg | ORAL_TABLET | Freq: Every day | ORAL | Status: DC
  Administered 2021-04-20 – 2021-04-21 (×2): 7.5 mg via ORAL
  Filled 2021-04-19 (×2): qty 1

## 2021-04-19 MED ORDER — ONDANSETRON HCL 4 MG/2ML IJ SOLN
INTRAMUSCULAR | Status: DC | PRN
  Administered 2021-04-19: 4 mg via INTRAVENOUS

## 2021-04-19 MED ORDER — HYDROCHLOROTHIAZIDE 12.5 MG PO TABS
6.2500 mg | ORAL_TABLET | Freq: Every day | ORAL | Status: DC
Start: 2021-04-20 — End: 2021-04-21
  Administered 2021-04-20 – 2021-04-21 (×2): 6.25 mg via ORAL
  Filled 2021-04-19 (×2): qty 1

## 2021-04-19 MED ORDER — ONDANSETRON HCL 4 MG PO TABS: 4.0000 mg | ORAL_TABLET | Freq: Four times a day (QID) | ORAL | Status: DC | PRN

## 2021-04-19 MED ORDER — HYDROMORPHONE HCL 1 MG/ML IJ SOLN
0.5000 mg | INTRAMUSCULAR | Status: DC | PRN
  Administered 2021-04-19: 1 mg via INTRAVENOUS
  Filled 2021-04-19: qty 1

## 2021-04-19 MED ORDER — 0.9 % SODIUM CHLORIDE (POUR BTL) OPTIME
TOPICAL | Status: DC | PRN
  Administered 2021-04-19: 1000 mL

## 2021-04-19 MED ORDER — ZOLPIDEM TARTRATE 5 MG PO TABS: 5.0000 mg | ORAL_TABLET | Freq: Every evening | ORAL | Status: DC | PRN

## 2021-04-19 MED ORDER — HYDROMORPHONE HCL 1 MG/ML IJ SOLN: 0.2500 mg | INTRAMUSCULAR | Status: DC | PRN

## 2021-04-19 MED ORDER — ROCURONIUM BROMIDE 10 MG/ML (PF) SYRINGE
PREFILLED_SYRINGE | INTRAVENOUS | Status: DC | PRN
Start: 2021-04-19 — End: 2021-04-19
  Administered 2021-04-19: 100 mg via INTRAVENOUS

## 2021-04-19 MED ORDER — DEXAMETHASONE SODIUM PHOSPHATE 4 MG/ML IJ SOLN
4.0000 mg | Freq: Four times a day (QID) | INTRAMUSCULAR | Status: AC
  Administered 2021-04-19 (×2): 4 mg via INTRAVENOUS
  Filled 2021-04-19 (×2): qty 1

## 2021-04-19 MED ORDER — PANTOPRAZOLE SODIUM 40 MG PO TBEC
40.0000 mg | DELAYED_RELEASE_TABLET | Freq: Every day | ORAL | Status: DC
Start: 2021-04-19 — End: 2021-04-21
  Administered 2021-04-19 – 2021-04-21 (×3): 40 mg via ORAL
  Filled 2021-04-19 (×3): qty 1

## 2021-04-19 MED ORDER — MECLIZINE HCL 25 MG PO TABS: 25.0000 mg | ORAL_TABLET | Freq: Three times a day (TID) | ORAL | Status: DC | PRN

## 2021-04-19 MED ORDER — VANCOMYCIN HCL 750 MG/150ML IV SOLN
750.0000 mg | INTRAVENOUS | Status: DC
  Administered 2021-04-20: 750 mg via INTRAVENOUS
  Filled 2021-04-19: qty 150

## 2021-04-19 MED ORDER — ORAL CARE MOUTH RINSE: 15.0000 mL | Freq: Once | OROMUCOSAL | Status: AC

## 2021-04-19 MED ORDER — THROMBIN 5000 UNITS EX SOLR
OROMUCOSAL | Status: DC | PRN
  Administered 2021-04-19 (×2): 5 mL via TOPICAL

## 2021-04-19 MED ORDER — DEXAMETHASONE 4 MG PO TABS: 4.0000 mg | ORAL_TABLET | Freq: Four times a day (QID) | ORAL | Status: AC

## 2021-04-19 MED ORDER — AMISULPRIDE (ANTIEMETIC) 5 MG/2ML IV SOLN: 10.0000 mg | Freq: Once | INTRAVENOUS | Status: DC | PRN

## 2021-04-19 MED ORDER — ACETAMINOPHEN 500 MG PO TABS
1000.0000 mg | ORAL_TABLET | Freq: Once | ORAL | Status: DC
  Filled 2021-04-19: qty 2

## 2021-04-19 MED ORDER — DEXAMETHASONE SODIUM PHOSPHATE 10 MG/ML IJ SOLN
INTRAMUSCULAR | Status: DC | PRN
  Administered 2021-04-19: 10 mg via INTRAVENOUS

## 2021-04-19 MED ORDER — SUFENTANIL CITRATE 50 MCG/ML IV SOLN
INTRAVENOUS | Status: AC
  Filled 2021-04-19: qty 1

## 2021-04-19 SURGICAL SUPPLY — 66 items
APL SKNCLS STERI-STRIP NONHPOA (GAUZE/BANDAGES/DRESSINGS) ×1
BAG COUNTER SPONGE SURGICOUNT (BAG) ×2 IMPLANT
BAG SPNG CNTER NS LX DISP (BAG) ×1
BAND INSRT 18 STRL LF DISP RB (MISCELLANEOUS)
BAND RUBBER #18 3X1/16 STRL (MISCELLANEOUS) IMPLANT
BENZOIN TINCTURE PRP APPL 2/3 (GAUZE/BANDAGES/DRESSINGS) ×3 IMPLANT
BIT DRILL NEURO 2X3.1 SFT TUCH (MISCELLANEOUS) ×1 IMPLANT
BLADE SURG 15 STRL LF DISP TIS (BLADE) ×1 IMPLANT
BLADE SURG 15 STRL SS (BLADE) ×2
BLADE ULTRA TIP 2M (BLADE) ×2 IMPLANT
BUR BARREL STRAIGHT FLUTE 4.0 (BURR) ×2 IMPLANT
BUR MATCHSTICK NEURO 3.0 LAGG (BURR) ×2 IMPLANT
CANISTER SUCT 3000ML PPV (MISCELLANEOUS) ×2 IMPLANT
CARTRIDGE OIL MAESTRO DRILL (MISCELLANEOUS) ×1 IMPLANT
COVER MAYO STAND STRL (DRAPES) ×2 IMPLANT
DECANTER SPIKE VIAL GLASS SM (MISCELLANEOUS) ×2 IMPLANT
DIFFUSER DRILL AIR PNEUMATIC (MISCELLANEOUS) ×2 IMPLANT
DRAIN JACKSON PRATT 10MM FLAT (MISCELLANEOUS) ×1 IMPLANT
DRAPE LAPAROTOMY 100X72 PEDS (DRAPES) ×2 IMPLANT
DRAPE MICROSCOPE LEICA (MISCELLANEOUS) IMPLANT
DRAPE SURG 17X23 STRL (DRAPES) ×2 IMPLANT
DRILL NEURO 2X3.1 SOFT TOUCH (MISCELLANEOUS) ×2
DRSG OPSITE POSTOP 3X4 (GAUZE/BANDAGES/DRESSINGS) ×2 IMPLANT
DRSG OPSITE POSTOP 4X6 (GAUZE/BANDAGES/DRESSINGS) ×1 IMPLANT
ELECT BLADE 4.0 EZ CLEAN MEGAD (MISCELLANEOUS) ×2
ELECT REM PT RETURN 9FT ADLT (ELECTROSURGICAL) ×2
ELECTRODE BLDE 4.0 EZ CLN MEGD (MISCELLANEOUS) IMPLANT
ELECTRODE REM PT RTRN 9FT ADLT (ELECTROSURGICAL) ×1 IMPLANT
EVACUATOR SILICONE 100CC (DRAIN) ×1 IMPLANT
GAUZE 4X4 16PLY ~~LOC~~+RFID DBL (SPONGE) ×1 IMPLANT
GLOVE EXAM NITRILE XL STR (GLOVE) IMPLANT
GLOVE SURG ENC MOIS LTX SZ8 (GLOVE) ×3 IMPLANT
GLOVE SURG ENC MOIS LTX SZ8.5 (GLOVE) ×2 IMPLANT
GLOVE SURG POLYISO LF SZ7 (GLOVE) ×2 IMPLANT
GLOVE SURG UNDER POLY LF SZ7.5 (GLOVE) ×2 IMPLANT
GOWN STRL REUS W/ TWL LRG LVL3 (GOWN DISPOSABLE) IMPLANT
GOWN STRL REUS W/ TWL XL LVL3 (GOWN DISPOSABLE) IMPLANT
GOWN STRL REUS W/TWL LRG LVL3 (GOWN DISPOSABLE) ×4
GOWN STRL REUS W/TWL XL LVL3 (GOWN DISPOSABLE)
HEMOSTAT POWDER KIT SURGIFOAM (HEMOSTASIS) ×3 IMPLANT
KIT BASIN OR (CUSTOM PROCEDURE TRAY) ×2 IMPLANT
KIT TURNOVER KIT B (KITS) ×2 IMPLANT
MARKER SKIN DUAL TIP RULER LAB (MISCELLANEOUS) ×2 IMPLANT
NDL SPNL 18GX3.5 QUINCKE PK (NEEDLE) ×1 IMPLANT
NEEDLE HYPO 22GX1.5 SAFETY (NEEDLE) ×3 IMPLANT
NEEDLE SPNL 18GX3.5 QUINCKE PK (NEEDLE) ×2 IMPLANT
NS IRRIG 1000ML POUR BTL (IV SOLUTION) ×2 IMPLANT
OIL CARTRIDGE MAESTRO DRILL (MISCELLANEOUS) ×2
PACK LAMINECTOMY NEURO (CUSTOM PROCEDURE TRAY) ×3 IMPLANT
PATTIES SURGICAL 1X1 (DISPOSABLE) ×1 IMPLANT
PIN DISTRACTION 14MM (PIN) ×4 IMPLANT
PLATE SKYLINE 2LVL 26MM (Plate) ×1 IMPLANT
PUTTY DBM 2CC CALC GRAN (Putty) ×1 IMPLANT
RASP 3.0MM (RASP) ×1 IMPLANT
SCREW SELF DRILL SKYLINE 12MM (Screw) ×5 IMPLANT
SCREW SKYLINE VARIABLE LG (Screw) ×2 IMPLANT
SPACER CIF 7 4D SM (Spacer) ×2 IMPLANT
SPONGE INTESTINAL PEANUT (DISPOSABLE) ×4 IMPLANT
SPONGE SURGIFOAM ABS GEL SZ50 (HEMOSTASIS) IMPLANT
STRIP CLOSURE SKIN 1/2X4 (GAUZE/BANDAGES/DRESSINGS) ×2 IMPLANT
SUT VIC AB 0 CT1 27 (SUTURE) ×2
SUT VIC AB 0 CT1 27XBRD ANTBC (SUTURE) ×1 IMPLANT
SUT VIC AB 3-0 SH 8-18 (SUTURE) ×3 IMPLANT
TOWEL GREEN STERILE (TOWEL DISPOSABLE) ×3 IMPLANT
TOWEL GREEN STERILE FF (TOWEL DISPOSABLE) ×2 IMPLANT
WATER STERILE IRR 1000ML POUR (IV SOLUTION) ×2 IMPLANT

## 2021-04-19 NOTE — Anesthesia Preprocedure Evaluation (Addendum)
Anesthesia Evaluation  ?Patient identified by MRN, date of birth, ID band ?Patient awake ? ? ? ?Reviewed: ?Allergy & Precautions, NPO status , Patient's Chart, lab work & pertinent test results, reviewed documented beta blocker date and time  ? ?History of Anesthesia Complications ?Negative for: history of anesthetic complications ? ?Airway ?Mallampati: I ? ?TM Distance: >3 FB ?Neck ROM: Full ? ? ? Dental ?no notable dental hx. ?(+) Teeth Intact, Dental Advisory Given ?  ?Pulmonary ?sleep apnea (non compliant w/ cpap) ,  ?  ?Pulmonary exam normal ?breath sounds clear to auscultation ? ? ? ? ? ? Cardiovascular ?hypertension (149/78 in preop), Pt. on medications and Pt. on home beta blockers ?Normal cardiovascular exam ?Rhythm:Regular Rate:Normal ? ?Echo 2013 normal  ?  ?Neuro/Psych ?Seizures - (none since 2015, a/w meningioma ), Well Controlled,  PSYCHIATRIC DISORDERS Anxiety Depression   ? GI/Hepatic ?Neg liver ROS, hiatal hernia, PUD, GERD  Controlled,  ?Endo/Other  ?diabetes, Well Controlled, Type 2, Oral Hypoglycemic AgentsHypothyroidism Obesity BMI 38 ?a1c 6.5 ?FS 100 in preop, usually starts to feel hypoglycemic around <90. Drank apple juice this AM for FS 97 ? Renal/GU ?Renal InsufficiencyRenal diseaseCr 1.37 Bladder dysfunction (frequency) ? ? ? ?  ?Musculoskeletal ? ?(+) Arthritis , Osteoarthritis,   ? Abdominal ?(+) + obese,   ?Peds ? Hematology ?negative hematology ROS ?(+)   ?Anesthesia Other Findings ?Neck pain worse w/ side to side movement and neck flexion ? Reproductive/Obstetrics ?negative OB ROS ? ?  ? ? ? ? ? ? ? ? ? ? ? ? ? ?  ?  ? ? ? ? ? ? ? ?Anesthesia Physical ?Anesthesia Plan ? ?ASA: 2 ? ?Anesthesia Plan: General  ? ?Post-op Pain Management: Tylenol PO (pre-op)*  ? ?Induction: Intravenous ? ?PONV Risk Score and Plan: 3 and Ondansetron, Dexamethasone and Treatment may vary due to age or medical condition ? ?Airway Management Planned: Oral ETT and Video  Laryngoscope Planned ? ?Additional Equipment: None ? ?Intra-op Plan:  ? ?Post-operative Plan: Extubation in OR ? ?Informed Consent: I have reviewed the patients History and Physical, chart, labs and discussed the procedure including the risks, benefits and alternatives for the proposed anesthesia with the patient or authorized representative who has indicated his/her understanding and acceptance.  ? ? ? ?Dental advisory given ? ?Plan Discussed with: CRNA ? ?Anesthesia Plan Comments: (Will check FS q1h and give dextrose as needed)  ? ? ? ? ? ?Anesthesia Quick Evaluation ? ?

## 2021-04-19 NOTE — H&P (Signed)
Subjective: ?The patient is a 81 year old black female whose had a previous anterior cervicectomy fusion and plating.  She has done well for many years but has developed recurrent neck pain with pain into her left shoulder and arm.  She failed medical management.  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  I discussed the various treatment options with her.  She has decided proceed with surgery. ? ?Past Medical History:  ?Diagnosis Date  ? Allergy   ? Anxiety   ? Arthritis   ? Asthma   ? patient denies   ? Cataract   ? bilateral lens implants   ? Colon polyps   ? Depression   ? Diabetes mellitus   ? Type two  ? Diverticulitis   ? Double vision 2019  ? left eye  ? Family history of adverse reaction to anesthesia   ? daughter- N/V   ? Family history of ovarian cancer   ? Family history of pancreatic cancer   ? Frequency of urination   ? GERD (gastroesophageal reflux disease)   ? History of hiatal hernia   ? Hyperglycemia   ? Hyperlipidemia   ? Hypertension   ? Hyperthyroidism   ? PT HAD BIPOSY -NEGATIVE.Marland Kitchen AND NOW JUST GETS CHECKED EACH YEAR .Marland Kitchen 2013 LAST TEST   SEES DR. [name].   ? Multiple meningiomas of spine and brain ([place])   ? Obstructive sleep apnea   ? STUDY AT [facility] DOES NOT USE C PAP  ? Palpitations   ? Peptic ulcer disease   ? Pneumonia   ? "walking pneumonia" hx of  ? Seizures ([place]) 2015  ? one siezure with brain surgery none since 2015   ? Sleep apnea   ? no CPAP  ? Urinary tract infection 03/2020  ?  ?Past Surgical History:  ?Procedure Laterality Date  ? ABDOMINAL HYSTERECTOMY    ? BACK SURGERY    ? L SPIN 2003   NECK 2004  ? BRAIN MENINGIOMA EXCISION  08/2008  ? X 2  ? C5-C6 neck fusion    ? COLONOSCOPY  05/05/2009  ? pt states multiple polyps removed in both colonoscopies she has had  ? CRANIOTOMY  08/13/2011  ? Procedure: CRANIOTOMY TUMOR EXCISION;  Surgeon: [name], MD;  Location: [facility];  Service:  Neurosurgery;  Laterality: Bilateral;  Bifrontal craniotomy for tumor  ? DILATION AND CURETTAGE OF UTERUS    ? YEARS AGO  ? L4-L5 posterior la  2021  ? L3-L5 fusion  ? left foot plantar and hammertoe    ? right foot bunectomy    ? right knee arthroscopy    ? x 3  ? right shoulder rotator cuff    ? ROBOTIC ASSISTED BILATERAL SALPINGO OOPHERECTOMY N/A 08/25/2015  ? Procedure: XI ROBOTIC ASSISTED BILATERAL SALPINGO OOPHORECTOMY;  Surgeon: [name], MD;  Location: [facility];  Service: Gynecology;  Laterality: N/A;  ? TONSILLECTOMY    ? TUBAL LIGATION    ? WRIST SURGERY Right   ?  ?Allergies  ?Allergen Reactions  ? Flagyl [Metronidazole] Other (See Comments)  ?  Caused increased heart rate  ? Aspirin Effervescent Swelling  ?  Alka-Seltzer gel caps- sore mouth, face swollen, turned hands white  ? Morphine Sulfate Hives  ?  Hallucinations  ? Penicillins Swelling and Rash  ?  Has patient had a PCN reaction causing immediate rash, facial/tongue/throat swelling, SOB or lightheadedness  with hypotension: Yes ?Has patient had a PCN reaction causing severe rash involving mucus membranes or skin necrosis: No ?Has patient had a PCN reaction that required hospitalization No ?Has patient had a PCN reaction occurring within the last 10 years: No ?If all of the above answers are "NO", then may proceed with Cephalosporin use. ?  ?  ?Social History  ? ?Tobacco Use  ? Smoking status: Never  ? Smokeless tobacco: Never  ?Substance Use Topics  ? Alcohol use: No  ?  ?Family History  ?Problem Relation Age of Onset  ? Ovarian cancer Mother   ? Heart disease Father 46  ? Pancreatic cancer Maternal Aunt   ? Diabetes Maternal Grandmother   ? Anesthesia problems Daughter   ?     NAUSEA AND VOMITING POST OP  ? Arthritis Other   ? Hyperlipidemia Other   ? Hypertension Other   ? Diabetes Other   ? Breast cancer Neg Hx   ? Colon cancer Neg Hx   ? Colon polyps Neg Hx   ? Esophageal cancer Neg Hx   ? Stomach cancer Neg Hx   ? Rectal cancer Neg Hx    ? ?Prior to Admission medications   ?Medication Sig Start Date End Date Taking? Authorizing Provider  ?acetaminophen (TYLENOL) 650 MG CR tablet Take 1,300 mg by mouth every 8 (eight) hours as needed for pain.   Yes [provider]  ?amLODipine (NORVASC) 5 MG tablet TAKE ONE (1) TABLET EACH DAY 02/08/21  Yes Burchette, Alinda Sierras, MD  ?ascorbic acid (VITAMIN C) 500 MG tablet Take 500 mg by mouth daily.   Yes [provider]  ?cetirizine (ZYRTEC) 10 MG tablet Take 10 mg by mouth daily as needed for allergies.    Yes [provider]  ?Cyanocobalamin (VITAMIN B12 PO) Place 0.5 mLs under the tongue daily. OTC   Yes [provider]  ?Iron-Vitamins (GERITOL COMPLETE PO) Take 1 tablet by mouth daily.   Yes [provider]  ?JANUMET XR [phone] MG TB24 TAKE 1 TABLET DAILY AT LUNCH 12/22/20  Yes Elayne Snare, MD  ?levothyroxine (SYNTHROID) 75 MCG tablet 1 tablet before breakfast daily with extra half tablet once a week ?Patient taking differently: Take 75-112.5 mcg by mouth See admin instructions. 75 mcg every morning, except Sunday take 112.5 mcg 02/12/21  Yes Elayne Snare, MD  ?losartan-hydrochlorothiazide (HYZAAR) 100-12.5 MG tablet TAKE ONE (1) TABLET EACH DAY ?Patient taking differently: Take 0.5 tablets by mouth daily. 09/28/20  Yes Burchette, Alinda Sierras, MD  ?Magnesium 250 MG TABS Take 250 mg by mouth daily.   Yes [provider]  ?meclizine (ANTIVERT) 25 MG tablet Take 25 mg by mouth 3 (three) times daily as needed for dizziness.    Yes [provider]  ?metoprolol succinate (TOPROL-XL) 50 MG 24 hr tablet Take 1 tablet (50 mg total) by mouth daily. Take with or immediately following a meal. 11/01/20  Yes Burchette, Alinda Sierras, MD  ?OVER THE COUNTER MEDICATION Apply 1 application. topically daily as needed (neck pain/joint pain). Oxyrub otc pain relieving gel   Yes [provider]  ?potassium chloride (KLOR-CON) 10 MEQ tablet TAKE ONE (1) TABLET EACH DAY 01/02/21   Yes Burchette, Alinda Sierras, MD  ?Propylene Glycol (SYSTANE BALANCE) 0.6 % SOLN Place 1 drop into both eyes daily as needed (dry eyes).   Yes [provider]  ?RABEprazole (ACIPHEX) 20 MG tablet TAKE ONE (1) TABLET EACH DAY 02/03/21  Yes Burchette, Alinda Sierras, MD  ?rosuvastatin (  CRESTOR) 10 MG tablet Take 10 mg by mouth daily.   Yes [provider]  ?rosuvastatin (CRESTOR) 40 MG tablet TAKE ONE (1) TABLET EACH DAY 04/14/21  Yes Burchette, Alinda Sierras, MD  ?solifenacin (VESICARE) 5 MG tablet TAKE ONE (1) TABLET EACH DAY 02/03/18  Yes Burchette, Alinda Sierras, MD  ?valACYclovir (VALTREX) 1000 MG tablet TAKE 2 TABS TWICE DAILY FOR 1 DAY THEN AS NEEDED ?Patient taking differently: Take 1,000-2,000 mg by mouth daily as needed (cold sores). 04/11/20  Yes Burchette, Alinda Sierras, MD  ?ACCU-CHEK AVIVA PLUS test strip TEST BLOOD SUGAR TWICE DAILY. 02/07/21   Burchette, Alinda Sierras, MD  ?Accu-Chek Softclix Lancets lancets TEST BLOOD SUGAR TWICE DAILY. 02/07/21   Burchette, Alinda Sierras, MD  ?furosemide (LASIX) 40 MG tablet Take 0.5 tablets (20 mg total) by mouth daily as needed. For fluid 09/24/14   Burchette, Alinda Sierras, MD  ?Lancet Devices [facility]) lancets 1 each by Other route 2 (two) times daily. E11.9 03/31/15   Burchette, Alinda Sierras, MD  ?venlafaxine (EFFEXOR XR) 37.5 MG 24 hr capsule Take 1 capsule (37.5 mg total) by mouth daily. 02/15/11 04/11/11  Burchette, Alinda Sierras, MD  ? ?  ?Review of Systems ? ?Positive ROS: As above ? ?All other systems have been reviewed and were otherwise negative with the exception of those mentioned in the HPI and as above. ? ?Objective: ?Vital signs in last 24 hours: ?Temp:  [97.8 ?F (36.6 ?C)] 97.8 ?F (36.6 ?C) (03/29 1009) ?Pulse Rate:  [83] 83 (03/29 1009) ?Resp:  [17] 17 (03/29 1009) ?BP: (149)/(78) 149/78 (03/29 1009) ?SpO2:  [100 %] 100 % (03/29 1009) ?Weight:  [102.5 kg] 102.5 kg (03/29 1009) ?Estimated body mass index is 37.61 kg/m? as calculated from the following: ?  Height as of this encounter: '5\' 5"'$   (1.651 m). ?  Weight as of this encounter: 102.5 kg. ? ? ?General Appearance: Alert ?Head: Normocephalic, without obvious abnormality, atraumatic ?Eyes: PERRL, conjunctiva/corneas clear, EOM's intact,    ?E

## 2021-04-19 NOTE — Op Note (Signed)
Brief history: The patient is a 81 year old white female on whom I performed a C5-6 and C6-7 anterior cervicectomy fusion and plating many years ago.  She has done well until recently when she developed recurrent neck left shoulder and arm pain consistent with a cervical radiculopathy.  She failed medical management and was worked up with a cervical MRI which demonstrated spondylosis and stenosis at C3-4 and C4-5.  I discussed the various treatment options with her.  She has decided proceed with surgery. ? 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 ?Postoperative diagnosis: The same ? ?Procedure: C3-4 and C4-5 anterior cervical discectomy/decompression; C3-4 and C4-5 interbody arthrodesis with local morcellized autograft bone and Zimmer DBM; insertion of interbody prosthesis at C3-4 and C4-5 (Zimmer peek interbody prosthesis); anterior cervical plating from C3-C5 with globus titanium plate; exploration of cervical fusion/removal of cervical plate ? ?Surgeon: Dr. Earle Gell ? ?Asst.: Arnetha Massy, NP ? ?Anesthesia: Gen. endotracheal ? ?Estimated blood loss: 100 cc ? ?Drains: 1 Jackson-Pratt drain in the prevertebral space ? ?Complications: None ? ?Description of procedure: The patient was brought to the operating room by the anesthesia team. General endotracheal anesthesia was induced. A roll was placed under the patient's shoulders to keep the neck in the neutral position. The patient's anterior cervical region was then prepared with Betadine scrub and Betadine solution. Sterile drapes were applied. ? ?The area to be incised was then injected with Marcaine with epinephrine solution. I then used a scalpel to make a transverse incision in the patient's left anterior neck. I used the Metzenbaum scissors to divide the platysmal muscle and then to dissect through scar tissue and medial to the sternocleidomastoid muscle, jugular vein, and  carotid artery. I carefully dissected down towards the anterior cervical spine identifying the esophagus and retracting it medially. Then using Kitner swabs to clear soft tissue from the anterior cervical spine and to expose the old anterior cervical plate from A0-T6. ? ?We explored the fusion by exposing the old hardware.  We were able to remove the upper screws i.e. the C 5 screws and the right C7 screw.  The other screws had the locking screw covered over and bone making impossible to remove the screws.  We therefore decided to cut the plate at A2-6 to make room for the new plate.  The arthrodesis at C5-6 appeared solid. ? ? ?I then used electrocautery to detach the medial border of the longus colli muscle bilaterally from the C3-4 and see 4 5 intervertebral disc spaces. I then inserted the Caspar self-retaining retractor underneath the longus colli muscle bilaterally to provide exposure. ? ?We then incised the intervertebral disc at C4-5. We then performed a partial intervertebral discectomy with a pituitary forceps and the Karlin curettes. I then inserted distraction screws into the vertebral bodies at C4-5. We then distracted the interspace. We then used the high-speed drill to decorticate the vertebral endplates at J3-3, to drill away the remainder of the intervertebral disc, to drill away some posterior spondylosis, and to thin out the posterior longitudinal ligament. I then incised ligament with the arachnoid knife. We then removed the ligament with a Kerrison punches undercutting the vertebral endplates and decompressing the thecal sac. We then performed foraminotomies about the bilateral C5 nerve roots. This completed the decompression at this level. ? ?We then repeat his procedure in analogous fashion at C3-4 decompressing the thecal sac and the bilateral C4 nerve roots. ? ?We now turned our to attention to the  interbody fusion. We used the trial spacers to determine the appropriate size for the  interbody prosthesis. We then pre-filled prosthesis with a combination of local morcellized autograft bone that we obtained during decompression as well as Zimmer DBM. We then inserted the prosthesis into the distracted interspace at C3-4 and C4-5. We then removed the distraction screws. There was a good snug fit of the prosthesis in the interspace. ? ?Having completed the fusion we now turned attention to the anterior spinal instrumentation. We used the high-speed drill to drill away some anterior spondylosis at the disc spaces so that the plate lay down flat. We selected the appropriate length titanium anterior cervical plate. We laid it along the anterior aspect of the vertebral bodies from C3-C5. We then drilled 12 mm holes at C3, C4 and C5. We then secured the plate to the vertebral bodies by placing two 12 mm self-tapping and self drilling screws at C3, C4 and C5. We then obtained intraoperative radiograph. The demonstrating good position of the instrumentation. We therefore secured the screws the plate the locking each cam. This completed the instrumentation. ? ?We then obtained hemostasis using bipolar electrocautery. We irrigated the wound out with bacitracin solution. We then removed the retractor. We inspected the esophagus for any damage. There was none apparent.  We placed a 10 mm flat Terrial Rhodes drain in the prevertebral space and tunneled out through a separate stab wound.  We then reapproximated patient's platysmal muscle with interrupted 3-0 Vicryl suture. We then reapproximated the subcutaneous tissue with interrupted 3-0 Vicryl suture. The skin was reapproximated with Steri-Strips and benzoin. The wound was then covered with bacitracin ointment. A sterile dressing was applied. The drapes were removed. Patient was subsequently extubated by the anesthesia team and transported to the post anesthesia care unit in stable condition. All sponge instrument and needle counts were reportedly correct at  the end of this case. ? ?

## 2021-04-19 NOTE — Progress Notes (Signed)
Pharmacy Antibiotic Note ? ?Dawn Salas is a 81 y.o. female admitted on 04/19/2021 with surgical prophylaxis.   Patient received x1 vancomycin '1000mg'$  IV dose prior to surgery. Currently has 1 drain in the prevertebral space. Pharmacy has been consulted for vancomycin dosing. ? ?Plan: ?Vancomycin '750mg'$  IV Q24h (goal AUC 400-550, eAUC 493, Scr 1.37, Vd 0.5) ? ? ?Height: '5\' 5"'$  (165.1 cm) ?Weight: 102.5 kg (226 lb) ?IBW/kg (Calculated) : 57 ? ?Temp (24hrs), Avg:98 ?F (36.7 ?C), Min:97.8 ?F (36.6 ?C), Max:98.5 ?F (36.9 ?C) ? ?No results for input(s): WBC, CREATININE, LATICACIDVEN, VANCOTROUGH, VANCOPEAK, VANCORANDOM, GENTTROUGH, GENTPEAK, GENTRANDOM, TOBRATROUGH, TOBRAPEAK, TOBRARND, AMIKACINPEAK, AMIKACINTROU, AMIKACIN in the last 168 hours.  ?Estimated Creatinine Clearance: 40.2 mL/min (A) (by C-G formula based on SCr of 1.37 mg/dL (H)).   ? ?Allergies  ?Allergen Reactions  ? Flagyl [Metronidazole] Other (See Comments)  ?  Caused increased heart rate  ? Aspirin Effervescent Swelling  ?  Alka-Seltzer gel caps- sore mouth, face swollen, turned hands white  ? Morphine Sulfate Hives  ?  Hallucinations  ? Penicillins Swelling and Rash  ?  Has patient had a PCN reaction causing immediate rash, facial/tongue/throat swelling, SOB or lightheadedness with hypotension: Yes ?Has patient had a PCN reaction causing severe rash involving mucus membranes or skin necrosis: No ?Has patient had a PCN reaction that required hospitalization No ?Has patient had a PCN reaction occurring within the last 10 years: No ?If all of the above answers are "NO", then may proceed with Cephalosporin use. ?  ? ? ? ?Thank you for allowing pharmacy to be a part of this patient?s care. ? ?Ardyth Harps, PharmD ?Clinical Pharmacist ? ? ?

## 2021-04-19 NOTE — Anesthesia Postprocedure Evaluation (Signed)
Anesthesia Post Note ? ?Patient: Dawn Salas ? ?Procedure(s) Performed: Anterior Cervical Discectomy and Fusion with Interbody Prosthesis, Plates/Screws Cervical Three-Four/Cervical Four-Five REMoval CERVICAL PLATE ? ?  ? ?Patient location during evaluation: PACU ?Anesthesia Type: General ?Level of consciousness: awake and alert, oriented and patient cooperative ?Pain management: pain level controlled ?Vital Signs Assessment: post-procedure vital signs reviewed and stable ?Respiratory status: spontaneous breathing, nonlabored ventilation and respiratory function stable ?Cardiovascular status: blood pressure returned to baseline and stable ?Postop Assessment: no apparent nausea or vomiting ?Anesthetic complications: no ? ? ?No notable events documented. ? ?Last Vitals:  ?Vitals:  ? 04/19/21 1530 04/19/21 1545  ?BP: (!) 163/81 (!) 161/83  ?Pulse: 93 96  ?Resp: 19 (!) 21  ?Temp:    ?SpO2: 94% 96%  ?  ?Last Pain:  ?Vitals:  ? 04/19/21 1545  ?TempSrc:   ?PainSc: 0-No pain  ? ? ?LLE Motor Response: Purposeful movement (04/19/21 1545) ?LLE Sensation: Full sensation (04/19/21 1545) ?RLE Motor Response: Purposeful movement (04/19/21 1545) ?RLE Sensation: Full sensation (04/19/21 1545) ?  ?  ? ?[name] ? ? ? ? ?

## 2021-04-19 NOTE — Transfer of Care (Signed)
Immediate Anesthesia Transfer of Care Note ? ?Patient: Dawn Salas ? ?Procedure(s) Performed: Anterior Cervical Discectomy and Fusion with Interbody Prosthesis, Plates/Screws Cervical Three-Four/Cervical Four-Five REMoval CERVICAL PLATE ? ?Patient Location: PACU ? ?Anesthesia Type:General ? ?Level of Consciousness: drowsy, patient cooperative and responds to stimulation ? ?Airway & Oxygen Therapy: Patient Spontanous Breathing and Patient connected to nasal cannula oxygen ? ?Post-op Assessment: Report given to RN, Post -op Vital signs reviewed and stable and Patient moving all extremities X 4 ? ?Post vital signs: Reviewed and stable ? ?Last Vitals:  ?Vitals Value Taken Time  ?BP 182/94 04/19/21 1447  ?Temp    ?Pulse 86 04/19/21 1449  ?Resp 11 04/19/21 1449  ?SpO2 100 % 04/19/21 1449  ?Vitals shown include unvalidated device data. ? ?Last Pain:  ?Vitals:  ? 04/19/21 1026  ?TempSrc:   ?PainSc: 5   ?   ? ?Patients Stated Pain Goal: 3 (04/19/21 1026) ? ?Complications: No notable events documented. ?

## 2021-04-19 NOTE — Progress Notes (Signed)
? ?  Providing Compassionate, Quality Care - Together ? ? ?Subjective: ?Patient still very lethargic from anesthesia. She is recovering in the PACU during this exam. Will follow commands with repeated stimulation. ? ?Objective: ?Vital signs in last 24 hours: ?Temp:  [97.8 ?F (36.6 ?C)] 97.8 ?F (36.6 ?C) (03/29 1445) ?Pulse Rate:  [83-97] 93 (03/29 1515) ?Resp:  [13-18] 18 (03/29 1515) ?BP: (149-186)/(78-94) 173/87 (03/29 1515) ?SpO2:  [97 %-100 %] 97 % (03/29 1515) ?Weight:  [102.5 kg] 102.5 kg (03/29 1009) ? ?Intake/Output from previous day: ?No intake/output data recorded. ?Intake/Output this shift: ?Total I/O ?In: 2200 [I.V.:2200] ?Out: 150 [Blood:150] ? ?Responds to voice. ?PERRLA ?MAE, strength and sensation intact aside from left deltoid which is 2/5 ?Incision covered with Honeycomb dressing and steri strips. Dressing is clean, dry, and intact. ?JP drain in place and charged. Sanguinous drainage in the bulb. ? ?Lab Results: ?No results for input(s): WBC, HGB, HCT, PLT in the last 72 hours. ?BMET ?No results for input(s): NA, K, CL, CO2, GLUCOSE, BUN, CREATININE, CALCIUM in the last 72 hours. ? ?Studies/Results: ?DG Cervical Spine 1 View ? ?Result Date: 04/19/2021 ?CLINICAL DATA:  Surgical anterior fusion of C3-4 and C4-5. EXAM: DG CERVICAL SPINE - 1 VIEW COMPARISON:  November 08, 2020. FINDINGS: Single intraoperative cross-table lateral projection of the cervical spine was obtained. The patient appears to be status post surgical anterior fusion of C3-4 and C4-5. IMPRESSION: Postsurgical changes as described above. Electronically Signed   By: Marijo Conception M.D.   On: 04/19/2021 14:29   ? ?Assessment/Plan: ?Dawn Salas underwent C3-4, C4-5 ACDF by Dr. Arnoldo Morale. 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 She is to be admitted to 3C03 following recovery from anesthesia in the PACU. ? ? LOS: 0 days  ? ? ? ?Viona Gilmore, DNP, AGNP-C ?Nurse Practitioner ? ?[facility] ?[address], [place], [place] [address] ?P: [phone]    F: [phone] ? ?04/19/2021, 3:23 PM ? ? ? ? ?

## 2021-04-19 NOTE — Progress Notes (Signed)
Orthopedic Tech Progress Note ?Patient Details:  ?Dawn Salas ?Dec 21, 1940 ?[id number] ? ?Ortho Devices ?Type of Ortho Device: Aspen cervical collar ?Ortho Device/Splint Location: Neck ?Ortho Device/Splint Interventions: Ordered ?  ?  ? ?Dawn Salas ?04/19/2021, 3:47 PM ? ?

## 2021-04-19 NOTE — Anesthesia Procedure Notes (Addendum)
Procedure Name: Intubation ?Date/Time: 04/19/2021 11:46 AM ?Performed by: Reece Agar, CRNA ?Pre-anesthesia Checklist: Patient identified, Emergency Drugs available, Suction available and Patient being monitored ?Patient Re-evaluated:Patient Re-evaluated prior to induction ?Oxygen Delivery Method: Circle System Utilized ?Preoxygenation: Pre-oxygenation with 100% oxygen ?Induction Type: IV induction ?Ventilation: Mask ventilation without difficulty ?Laryngoscope Size: Mac and 3 ?Grade View: Grade I ?Tube type: Oral ?Tube size: 7.5 mm ?Number of attempts: 1 ?Airway Equipment and Method: Stylet ?Placement Confirmation: ETT inserted through vocal cords under direct vision, positive ETCO2 and breath sounds checked- equal and bilateral ?Secured at: 21 cm ?Tube secured with: Tape ?Dental Injury: Teeth and Oropharynx as per pre-operative assessment  ?Comments: In-line neck mobilization maintained throughout induction/intubation  ? ? ? ? ?

## 2021-04-20 ENCOUNTER — Encounter (HOSPITAL_COMMUNITY): Payer: Self-pay | Admitting: Neurosurgery

## 2021-04-20 LAB — GLUCOSE, CAPILLARY
Glucose-Capillary: 149 mg/dL — ABNORMAL HIGH (ref 70–99)
Glucose-Capillary: 198 mg/dL — ABNORMAL HIGH (ref 70–99)
Glucose-Capillary: 199 mg/dL — ABNORMAL HIGH (ref 70–99)
Glucose-Capillary: 134 mg/dL — ABNORMAL HIGH (ref 70–99)

## 2021-04-20 MED ORDER — INSULIN ASPART 100 UNIT/ML IJ SOLN: 0.0000 [IU] | Freq: Every day | INTRAMUSCULAR | Status: DC

## 2021-04-20 MED ORDER — INSULIN ASPART 100 UNIT/ML IJ SOLN: 0.0000 [IU] | Freq: Three times a day (TID) | INTRAMUSCULAR | Status: DC

## 2021-04-20 MED FILL — Thrombin For Soln 5000 Unit: CUTANEOUS | Qty: 5000 | Status: AC

## 2021-04-20 NOTE — Progress Notes (Signed)
Subjective: ?The patient is alert and pleasant.  She looks and feels well.  Her left deltoid remains weak.  She denies dysphagia, numbness, tingling, weakness other than her left shoulder. ? ?Objective: ?Vital signs in last 24 hours: ?Temp:  [97.8 ?F (36.6 ?C)-98.5 ?F (36.9 ?C)] 97.9 ?F (36.6 ?C) (03/30 0720) ?Pulse Rate:  [81-97] 81 (03/30 0720) ?Resp:  [13-22] 18 (03/30 0720) ?BP: (145-186)/(79-94) 148/79 (03/30 0720) ?SpO2:  [94 %-100 %] 100 % (03/30 0720) ?Estimated body mass index is 37.61 kg/m? as calculated from the following: ?  Height as of this encounter: '5\' 5"'$  (1.651 m). ?  Weight as of this encounter: 102.5 kg. ? ? ?Intake/Output from previous day: ?03/29 0701 - 03/30 0700 ?In: 2200 [I.V.:2200] ?Out: 210 [Drains:60; Blood:150] ?Intake/Output this shift: ?No intake/output data recorded. ? ?Physical exam the patient is alert and pleasant.  Her dressing is clean and dry.  There is no hematoma or shift.  Her strength is normal except her left deltoid is 0/5 and she has slight weakness in her left bicep. ? ?Lab Results: ?No results for input(s): WBC, HGB, HCT, PLT in the last 72 hours. ?BMET ?No results for input(s): NA, K, CL, CO2, GLUCOSE, BUN, CREATININE, CALCIUM in the last 72 hours. ? ?Studies/Results: ?DG Cervical Spine 1 View ? ?Result Date: 04/19/2021 ?CLINICAL DATA:  Surgical anterior fusion of C3-4 and C4-5. EXAM: DG CERVICAL SPINE - 1 VIEW COMPARISON:  November 08, 2020. FINDINGS: Single intraoperative cross-table lateral projection of the cervical spine was obtained. The patient appears to be status post surgical anterior fusion of C3-4 and C4-5. IMPRESSION: Postsurgical changes as described above. Electronically Signed   By: Marijo Conception M.D.   On: 04/19/2021 14:29   ? ?Assessment/Plan: ?Postop day #1: The patient has an isolated C5 neuropraxia.  I discussed range of motion exercises to prevent her shoulder from freezing while we are awaiting this nerve to recover.  She is going to continue with  therapy and will likely go home tomorrow.  I gave her discharge instructions and answered all her questions. ? LOS: 1 day  ? ? ? ?Dawn Salas ?04/20/2021, 10:29 AM ? ? ? ? ?Patient ID: Dawn Salas, female   DOB: 1940/07/16, 81 y.o.   MRN: [id number] ? ?

## 2021-04-20 NOTE — TOC Progression Note (Signed)
Transition of Care (TOC) - Progression Note  ? ? ?Patient Details  ?Name: MAKENAH KARAS ?MRN: [id number] ?Date of Birth: 06/16/1940 ? ?Transition of Care (TOC) CM/SW Contact  ?Marilu Favre, RN ?Phone Number: ?04/20/2021, 9:52 AM ? ?Clinical Narrative:    ? ?Dr Arnoldo Morale office has arranged home health services through [facility].  ? ?3C staff will provide any needed DME  ? ? ?Transition of Care (TOC) Screening Note ? ? ?Patient Details  ?Name: AMETHYST GAINER ?Date of Birth: Dec 12, 1940 ? ? ? ? ? ?Transition of Care Department [facility]) has reviewed patient and no TOC needs have been identified at this time. We will continue to monitor patient advancement through interdisciplinary progression rounds. If new patient transition needs arise, please place a TOC consult. ?  ? ?  ?  ? ?Expected Discharge Plan and Services ?  ?  ?  ?  ?  ?                ?  ?  ?  ?  ?  ?  ?  ?  ?  ?  ? ? ?Social Determinants of Health (SDOH) Interventions ?  ? ?Readmission Risk Interventions ?   ? View : No data to display.  ?  ?  ?  ? ? ?

## 2021-04-20 NOTE — Evaluation (Signed)
Occupational Therapy Evaluation ?Patient Details ?Name: Dawn Salas ?MRN: [id number] ?DOB: 1940/03/11 ?Today's Date: 04/20/2021 ? ? ?History of Present Illness 81 yo F s/p ACDF.  PMH includes: prior ACDF, arthritis, HTN, and depression.  ? ?Clinical Impression ?  ?Patient admitted for the procedure above.  PTA she lives at home with her husband and children.  She will have assist as needed.  Currently she is needing generalized supervision with all mobility and ADL completion at sit/stand level.  She is planning on discharge tomorrow, OT will follow along as needed.  No post acute OT is anticipated.    ?   ? ?Recommendations for follow up therapy are one component of a multi-disciplinary discharge planning process, led by the attending physician.  Recommendations may be updated based on patient status, additional functional criteria and insurance authorization.  ? ?Follow Up Recommendations ? No OT follow up  ?  ?Assistance Recommended at Discharge Intermittent Supervision/Assistance  ?Patient can return home with the following   ? ?  ?Functional Status Assessment ? Patient has had a recent decline in their functional status and demonstrates the ability to make significant improvements in function in a reasonable and predictable amount of time.  ?Equipment Recommendations ? None recommended by OT  ?  ?Recommendations for Other Services   ? ? ?  ?Precautions / Restrictions Precautions ?Precautions: Cervical ?Precaution Booklet Issued: Yes (comment) ?Precaution Comments: Issued and reviewed. ?Restrictions ?Weight Bearing Restrictions: No  ? ?  ? ?Mobility Bed Mobility ?Overal bed mobility: Needs Assistance ?Bed Mobility: Sidelying to Sit ?  ?Sidelying to sit: Supervision ?  ?  ?  ?  ?  ? ?Transfers ?Overall transfer level: Needs assistance ?  ?Transfers: Sit to/from Stand, Bed to chair/wheelchair/BSC ?Sit to Stand: Supervision ?  ?  ?Step pivot transfers: Supervision ?  ?  ?  ?  ? ?  ?Balance Overall balance  assessment: Mild deficits observed, not formally tested ?  ?  ?  ?  ?  ?  ?  ?  ?  ?  ?  ?  ?  ?  ?  ?  ?  ?  ?   ? ?ADL either performed or assessed with clinical judgement  ? ?ADL   ?  ?  ?  ?  ?  ?  ?  ?  ?  ?  ?  ?  ?  ?  ?  ?  ?  ?  ?  ?General ADL Comments: Setup and generalized supervision  ? ? ? ?Vision Patient Visual Report: No change from baseline ?   ?   ?Perception Perception ?Perception: Within Functional Limits ?  ?Praxis Praxis ?Praxis: Intact ?  ? ?Pertinent Vitals/Pain Pain Assessment ?Pain Assessment: Faces ?Faces Pain Scale: Hurts a little bit ?Pain Location: Neck and L shoulder ?Pain Descriptors / Indicators: Aching ?Pain Intervention(s): Monitored during session  ? ? ? ?Hand Dominance Right ?  ?Extremity/Trunk Assessment Upper Extremity Assessment ?Upper Extremity Assessment: LUE deficits/detail ?LUE Deficits / Details: weakness to L UE.  Chronic, 2/5 MMT.  Grip and bicep is 4/5 ?LUE Sensation: WNL ?LUE Coordination: WNL ?  ?Lower Extremity Assessment ?Lower Extremity Assessment: Overall WFL for tasks assessed ?  ?Cervical / Trunk Assessment ?Cervical / Trunk Assessment: Neck Surgery ?  ?Communication Communication ?Communication: No difficulties ?  ?Cognition Arousal/Alertness: Awake/alert ?Behavior During Therapy: [facility] for tasks assessed/performed ?Overall Cognitive Status: Within Functional Limits for tasks assessed ?  ?  ?  ?  ?  ?  ?  ?  ?  ?  ?  ?  ?  ?  ?  ?  ?  ?  ?  ?  General Comments   VSS on RA ? ?  ?Exercises   ?  ?Shoulder Instructions    ? ? ?Home Living Family/patient expects to be discharged to:: Private residence ?Living Arrangements: Spouse/significant other;Children ?Available Help at Discharge: Family;Available 24 hours/day ?Type of Home: House ?Home Access: Stairs to enter ?Entrance Stairs-Number of Steps: 2 ?Entrance Stairs-Rails: None ?Home Layout: Two level ?  ?  ?Bathroom Shower/Tub: Walk-in shower ?  ?Bathroom Toilet: Handicapped height ?Bathroom Accessibility: Yes ?How  Accessible: Accessible via walker ?Home Equipment: Cane - single point;Hand held shower head;Grab bars - tub/shower;Shower seat ?  ?  ?  ? ?  ?Prior Functioning/Environment Prior Level of Function : Independent/Modified Independent ?  ?  ?  ?  ?  ?  ?  ?  ?  ? ?  ?  ?OT Problem List: Pain;Impaired balance (sitting and/or standing) ?  ?   ?OT Treatment/Interventions: Self-care/ADL training;Therapeutic activities;Therapeutic exercise;Balance training  ?  ?OT Goals(Current goals can be found in the care plan section) Acute Rehab OT Goals ?Patient Stated Goal: Return home tomorrow ?OT Goal Formulation: With patient ?Time For Goal Achievement: 04/27/21 ?Potential to Achieve Goals: Good ?ADL Goals ?Pt Will Perform Lower Body Dressing: with modified independence;sit to/from stand ?Pt Will Transfer to Toilet: with modified independence;ambulating;regular height toilet  ?OT Frequency: Min 2X/week ?  ? ?Co-evaluation   ?  ?  ?  ?  ? ?  ?AM-PAC OT "6 Clicks" Daily Activity     ?Outcome Measure Help from another person eating meals?: None ?Help from another person taking care of personal grooming?: None ?Help from another person toileting, which includes using toliet, bedpan, or urinal?: A Little ?Help from another person bathing (including washing, rinsing, drying)?: A Little ?Help from another person to put on and taking off regular upper body clothing?: A Little ?Help from another person to put on and taking off regular lower body clothing?: A Little ?6 Click Score: 20 ?  ?End of Session Equipment Utilized During Treatment: Gait belt;Other (comment) (SPC) ?Nurse Communication: Mobility status ? ?Activity Tolerance:   ?Patient left: in chair;with call bell/phone within reach ? ?OT Visit Diagnosis: Unsteadiness on feet (R26.81);Pain ?Pain - Right/Left: Left ?Pain - part of body: Shoulder  ?              ?Time: 0940-1000 ?OT Time Calculation (min): 20 min ?Charges:  OT General Charges ?$OT Visit: 1 Visit ?OT Evaluation ?$OT  Eval Moderate Complexity: 1 Mod ? ?04/20/2021 ? ?RP, OTR/L ? ?Acute Rehabilitation Services ? ?Office:  [phone] ? ? ?Teyona Nichelson D Trenyce Loera ?04/20/2021, 10:13 AM ?

## 2021-04-21 LAB — GLUCOSE, CAPILLARY
Glucose-Capillary: 128 mg/dL — ABNORMAL HIGH (ref 70–99)
Glucose-Capillary: 104 mg/dL — ABNORMAL HIGH (ref 70–99)

## 2021-04-21 MED ORDER — OXYCODONE-ACETAMINOPHEN 5-325 MG PO TABS: 1.0000 | ORAL_TABLET | ORAL | 0 refills | Status: DC | PRN

## 2021-04-21 MED ORDER — CYCLOBENZAPRINE HCL 5 MG PO TABS: 5.0000 mg | ORAL_TABLET | Freq: Three times a day (TID) | ORAL | 0 refills | Status: DC | PRN

## 2021-04-21 MED ORDER — DOCUSATE SODIUM 100 MG PO CAPS: 100.0000 mg | ORAL_CAPSULE | Freq: Two times a day (BID) | ORAL | 0 refills | Status: DC

## 2021-04-21 NOTE — Discharge Summary (Signed)
Physician Discharge Summary  ? ? ? ?Providing Compassionate, Quality Care - Together ? ? ?Patient ID: ?Dawn Salas ?MRN: [id number] ?DOB/AGE: [birth year] 79 y.o. ? ?Admit date: 04/19/2021 ?Discharge date: 04/21/2021 ? ?Admission Diagnoses: Cervical spondylosis with myelopathy and radiculopathy ? ?Discharge Diagnoses:  ?Principal Problem: ?  Cervical spondylosis with myelopathy and radiculopathy ? ? ?Discharged Condition: good ? ?Hospital Course: Patient underwent a C5-6, C6-7 by Dr. Arnoldo Morale on 04/19/2021. She was admitted to 3C05 following recovery from anesthesia in the PACU. Her postoperative course has been uncomplicated. She has left deltoid weakness. She has worked with both physical and occupational therapies who feel the patient is ready for discharge home. She is ambulating independently and without difficulty. She is tolerating a normal diet. She is not having any bowel or bladder dysfunction. Her pain is well-controlled with oral pain medication. She is ready for discharge home. ? ? ?Consults: PT/OT ? ?Significant Diagnostic Studies: radiology: DG Cervical Spine 1 View ? ?Result Date: 04/19/2021 ?CLINICAL DATA:  Surgical anterior fusion of C3-4 and C4-5. EXAM: DG CERVICAL SPINE - 1 VIEW COMPARISON:  November 08, 2020. FINDINGS: Single intraoperative cross-table lateral projection of the cervical spine was obtained. The patient appears to be status post surgical anterior fusion of C3-4 and C4-5. IMPRESSION: Postsurgical changes as described above. Electronically Signed   By: Marijo Conception M.D.   On: 04/19/2021 14:29   ? ? ?Treatments: surgery:  C3-4 and C4-5 anterior cervical discectomy/decompression; C3-4 and C4-5 interbody arthrodesis with local morcellized autograft bone and Zimmer DBM; insertion of interbody prosthesis at C3-4 and C4-5 (Zimmer peek interbody prosthesis); anterior cervical plating from C3-C5 with globus titanium plate; exploration of cervical fusion/removal of cervical plate ? ?Discharge  Exam: ?Blood pressure 139/88, pulse 90, temperature 98.4 ?F (36.9 ?C), temperature source Oral, resp. rate 18, height '5\' 5"'$  (1.651 m), weight 102.5 kg, SpO2 99 %. ? ?Alert and oriented ?PERRLA ?MAE, strength and sensation intact aside from left deltoid which is 2/5 ?Incision covered with Honeycomb dressing and steri strips. Dressing is clean, dry, and intact. ? ? ?Disposition: Discharge disposition: 01-Home or Self Care ? ? ? ? ? ? ? ?Allergies as of 04/21/2021   ? ?   Reactions  ? Flagyl [metronidazole] Other (See Comments)  ? Caused increased heart rate  ? Aspirin Effervescent Swelling  ? Alka-Seltzer gel caps- sore mouth, face swollen, turned hands white  ? Morphine Sulfate Hives  ? Hallucinations  ? Penicillins Swelling, Rash  ? Has patient had a PCN reaction causing immediate rash, facial/tongue/throat swelling, SOB or lightheadedness with hypotension: Yes ?Has patient had a PCN reaction causing severe rash involving mucus membranes or skin necrosis: No ?Has patient had a PCN reaction that required hospitalization No ?Has patient had a PCN reaction occurring within the last 10 years: No ?If all of the above answers are "NO", then may proceed with Cephalosporin use.  ? ?  ? ?  ?Medication List  ?  ? ?TAKE these medications   ? ?Accu-Chek Aviva Plus test strip ?Generic drug: glucose blood ?TEST BLOOD SUGAR TWICE DAILY. ?  ?accu-chek softclix lancets ?1 each by Other route 2 (two) times daily. E11.9 ?  ?Accu-Chek Softclix Lancets lancets ?TEST BLOOD SUGAR TWICE DAILY. ?  ?acetaminophen 650 MG CR tablet ?Commonly known as: TYLENOL ?Take 1,300 mg by mouth every 8 (eight) hours as needed for pain. ?  ?amLODipine 5 MG tablet ?Commonly known as: NORVASC ?TAKE ONE (1) TABLET EACH DAY ?  ?ascorbic acid 500  MG tablet ?Commonly known as: VITAMIN C ?Take 500 mg by mouth daily. ?  ?cetirizine 10 MG tablet ?Commonly known as: ZYRTEC ?Take 10 mg by mouth daily as needed for allergies. ?  ?cyclobenzaprine 5 MG tablet ?Commonly  known as: FLEXERIL ?Take 1 tablet (5 mg total) by mouth 3 (three) times daily as needed for muscle spasms. ?  ?docusate sodium 100 MG capsule ?Commonly known as: COLACE ?Take 1 capsule (100 mg total) by mouth 2 (two) times daily. ?  ?furosemide 40 MG tablet ?Commonly known as: LASIX ?Take 0.5 tablets (20 mg total) by mouth daily as needed. For fluid ?  ?GERITOL COMPLETE PO ?Take 1 tablet by mouth daily. ?  ?Janumet XR [phone] MG Tb24 ?Generic drug: SitaGLIPtin-MetFORMIN HCl ?TAKE 1 TABLET DAILY AT LUNCH ?  ?levothyroxine 75 MCG tablet ?Commonly known as: SYNTHROID ?1 tablet before breakfast daily with extra half tablet once a week ?What changed:  ?how much to take ?how to take this ?when to take this ?additional instructions ?  ?losartan-hydrochlorothiazide 100-12.5 MG tablet ?Commonly known as: HYZAAR ?TAKE ONE (1) TABLET EACH DAY ?What changed: See the new instructions. ?  ?Magnesium 250 MG Tabs ?Take 250 mg by mouth daily. ?  ?meclizine 25 MG tablet ?Commonly known as: ANTIVERT ?Take 25 mg by mouth 3 (three) times daily as needed for dizziness. ?  ?metoprolol succinate 50 MG 24 hr tablet ?Commonly known as: TOPROL-XL ?Take 1 tablet (50 mg total) by mouth daily. Take with or immediately following a meal. ?  ?OVER THE COUNTER MEDICATION ?Apply 1 application. topically daily as needed (neck pain/joint pain). Oxyrub otc pain relieving gel ?  ?oxyCODONE-acetaminophen 5-325 MG tablet ?Commonly known as: Percocet ?Take 1-2 tablets by mouth every 4 (four) hours as needed for severe pain. ?  ?potassium chloride 10 MEQ tablet ?Commonly known as: KLOR-CON ?TAKE ONE (1) TABLET EACH DAY ?  ?RABEprazole 20 MG tablet ?Commonly known as: ACIPHEX ?TAKE ONE (1) TABLET EACH DAY ?  ?rosuvastatin 10 MG tablet ?Commonly known as: CRESTOR ?Take 10 mg by mouth daily. ?  ?rosuvastatin 40 MG tablet ?Commonly known as: CRESTOR ?TAKE ONE (1) TABLET EACH DAY ?  ?solifenacin 5 MG tablet ?Commonly known as: VESICARE ?TAKE ONE (1) TABLET EACH  DAY ?  ?Systane Balance 0.6 % Soln ?Generic drug: Propylene Glycol ?Place 1 drop into both eyes daily as needed (dry eyes). ?  ?valACYclovir 1000 MG tablet ?Commonly known as: VALTREX ?TAKE 2 TABS TWICE DAILY FOR 1 DAY THEN AS NEEDED ?What changed: See the new instructions. ?  ?VITAMIN B12 PO ?Place 0.5 mLs under the tongue daily. OTC ?  ? ?  ? ? Follow-up Information   ? ? [facility]. Follow up.   ?Contact information: ?[address] ?[place] [address] ?[phone] ? ? ?  ?  ? ? Newman Pies, MD. Go on 05/19/2021.   ?Specialty: Neurosurgery ?Why: First post op appointment with x-rays is 05/19/2021 at 12:45 pm. ?Contact information: ?[address] ?[address] ?[place] [address] ?[phone] ? ? ?  ?  ? ?  ?  ? ?  ? ? ?Signed: ?Viona Gilmore, DNP, AGNP-C ?Nurse Practitioner ? ?[facility] ?[address], [place], [place] [address] ?P: [phone]    F: [phone] ? ?04/21/2021, 10:07 AM ? ?

## 2021-04-21 NOTE — Discharge Instructions (Signed)
Wound Care ?Keep incision covered and dry for two days.    ?Do not put any creams, lotions, or ointments on incision. ?Leave steri-strips on back.  They will fall off by themselves. ?You are fine to shower. Let water run over incision and pat dry. ? ?Activity ?Walk each and every day, increasing distance each day. ?No lifting greater than 5 lbs.  Avoid excessive neck motion. ?No driving for 2 weeks; may ride as a passenger locally. ? ?Diet ?Resume your normal diet. ?  ?Return to Work ?Will be discussed at your follow up appointment. ? ?Call Your Doctor If Any of These Occur ?Redness, drainage, or swelling at the wound.  ?Temperature greater than 101 degrees. ?Severe pain not relieved by pain medication. ?Incision starts to come apart. ? ?Follow Up Appt ?Call [phone] today for appointment in 3-4 weeks if you don't already have one or for any problems.  If you have any hardware placed in your spine, you will need an x-ray before your appointment. ? ?

## 2021-04-21 NOTE — Progress Notes (Signed)
Patient awaiting transport via wheelchair by volunteer for discharge to home; in no acute distress nor complaints of pain nor discomfort; incision on her neck with honeycomb dressing and is clean, dry and intact  with Aspen collar on; room was checked for all her belongings and carried along with her on the way to her vehicle; discharge instructions concerning her medications, incision care, follow up appointment and when to call the doctor as needed were all discussed with patient by RN and she verbalized understanding on the instructions given. ?

## 2021-04-21 NOTE — Progress Notes (Signed)
Occupational Therapy Treatment ?Patient Details ?Name: Dawn Salas ?MRN: [id number] ?DOB: 1940-06-23 ?Today's Date: 04/21/2021 ? ? ?History of present illness 81 yo F s/p ACDF.  PMH includes: prior ACDF, arthritis, HTN, and depression. ?  ?OT comments ? Patient with continued progress to all patient focused goals.  She is very close to her baseline function for mobility at a [facility] level, and for ADL completion at sit/stand level. All precautions reviewed, and patient able to demonstrate understanding.  All questions answered, and no further OT needs exist in the acute setting.  Recommend follow up with MD as prescribed.    ? ?Recommendations for follow up therapy are one component of a multi-disciplinary discharge planning process, led by the attending physician.  Recommendations may be updated based on patient status, additional functional criteria and insurance authorization. ?   ?Follow Up Recommendations ? No OT follow up  ?  ?Assistance Recommended at Discharge Intermittent Supervision/Assistance  ?Patient can return home with the following ?   ?  ?Equipment Recommendations ? None recommended by OT  ?  ?Recommendations for Other Services   ? ?  ?Precautions / Restrictions Precautions ?Precautions: Cervical ?Precaution Booklet Issued: Yes (comment) ?Precaution Comments: Issued and reviewed. ?Restrictions ?Weight Bearing Restrictions: No  ? ? ?  ? ?Mobility Bed Mobility ?Overal bed mobility: Modified Independent ?  ?  ?  ?  ?  ?  ?  ?  ? ?Transfers ?Overall transfer level: Modified independent ?  ?  ?  ?  ?  ?  ?  ?  ?  ?  ?  ?Balance Overall balance assessment: Mild deficits observed, not formally tested ?  ?  ?  ?  ?  ?  ?  ?  ?  ?  ?  ?  ?  ?  ?  ?  ?  ?  ?   ? ?ADL either performed or assessed with clinical judgement  ? ?ADL Overall ADL's : Modified independent ?  ?  ?  ?  ?  ?  ?  ?  ?  ?  ?  ?  ?  ?  ?  ?  ?  ?  ?  ?  ?  ? ?Extremity/Trunk Assessment Upper Extremity Assessment ?Upper Extremity Assessment:  Defer to OT evaluation ?LUE Deficits / Details: weakness to L UE.  Chronic, 2/5 MMT.  Grip and bicep is 4/5 ?LUE Sensation: WNL ?LUE Coordination: decreased fine motor ?  ?Lower Extremity Assessment ?Lower Extremity Assessment: Overall WFL for tasks assessed ?  ?Cervical / Trunk Assessment ?Cervical / Trunk Assessment: Neck Surgery ?  ? ?   ?  ?  ?   ?  ?   ?  ? ?Cognition Arousal/Alertness: Awake/alert ?Behavior During Therapy: [facility] for tasks assessed/performed ?Overall Cognitive Status: Within Functional Limits for tasks assessed ?  ?  ?  ?  ?  ?  ?  ?  ?  ?  ?  ?  ?  ?  ?  ?  ?  ?  ?  ?   ?   ? ?  ?   ? ? ?  ?    ? ? ?Pertinent Vitals/ Pain       Pain Assessment ?Faces Pain Scale: Hurts a little bit ?Pain Location: Neck and L shoulder ?Pain Descriptors / Indicators: Aching ?Pain Intervention(s): Limited activity within patient's tolerance ? ?   ?  ?  ?  ?  ?  ?  ?  ?  ?  ?  ?  ?  ?  ?  ?  ?  ?  ?  ? ?  ?    ?  ?  ?  ?   ? ?  Frequency ?    ? ? ? ? ?  ?Progress Toward Goals ? ?OT Goals(current goals can now be found in the care plan section) ? Progress towards OT goals: Progressing toward goals ? ?Acute Rehab OT Goals ?Time For Goal Achievement: 04/27/21 ?Potential to Achieve Goals: Good  ?Plan All goals met and education completed, patient discharged from OT services   ? ?Co-evaluation ? ? ?   ?  ?  ?  ?  ? ?  ?AM-PAC OT "6 Clicks" Daily Activity     ?Outcome Measure ? ?   ?Help from another person taking care of personal grooming?: None ?Help from another person toileting, which includes using toliet, bedpan, or urinal?: None ?Help from another person bathing (including washing, rinsing, drying)?: None ?Help from another person to put on and taking off regular upper body clothing?: None ?Help from another person to put on and taking off regular lower body clothing?: None ?6 Click Score: 20 ? ?  ?End of Session Equipment Utilized During Treatment: Gait belt;Other (comment) ? ?OT Visit Diagnosis: Pain ?Pain -  Right/Left: Left ?Pain - part of body: Shoulder ?  ?Activity Tolerance   ?  ?Patient Left in chair;with call bell/phone within reach ?  ?Nurse Communication Mobility status ?  ? ?   ? ?Time: 2952-8413 ?OT Time Calculation (min): 18 min ? ?Charges: OT General Charges ?$OT Visit: 1 Visit ?OT Treatments ?$Self Care/Home Management : 8-22 mins ? ?04/21/2021 ? ?RP, OTR/L ? ?Acute Rehabilitation Services ? ?Office:  [phone] ? ? ?Dawn Salas D Dawn Salas ?04/21/2021, 9:48 AM ?

## 2021-04-21 NOTE — Plan of Care (Signed)

## 2021-04-22 ENCOUNTER — Other Ambulatory Visit: Payer: Self-pay | Admitting: Family Medicine

## 2021-05-09 NOTE — H&P (Addendum)
TOTAL KNEE ADMISSION H&P ? ?Patient is being admitted for right total knee arthroplasty. ? ?Subjective: ? ?Chief Complaint: Right knee pain. ? ?HPI: Dawn Salas, 81 y.o. female has a history of pain and functional disability in the right knee due to arthritis and has failed non-surgical conservative treatments for greater than 12 weeks to include NSAID's and/or analgesics, corticosteriod injections, and activity modification. Onset of symptoms was gradual, starting  several  years ago with gradually worsening course since that time. The patient noted prior procedures on the knee to include  arthroscopy with meniscal debridement on the right knee.  Patient currently rates pain in the right knee at 7 out of 10 with activity. Patient has night pain, worsening of pain with activity and weight bearing, and crepitus. Patient has evidence of  tricompartmental bone-on-bone changes in the right knee  by imaging studies. There is no active infection. ? ?Patient Active Problem List  ? Diagnosis Date Noted  ? Cervical spondylosis with myelopathy and radiculopathy 04/19/2021  ? Genetic testing 08/01/2020  ? Family history of ovarian cancer 07/14/2020  ? Colon polyps 07/14/2020  ? Family history of pancreatic cancer 07/14/2020  ? 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 CKD (chronic kidney disease) stage 3, GFR 30-59 ml/min (HCC) 10/01/2017  ? History of normocytic normochromic anemia 11/25/2015  ? 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 Spondylolisthesis of lumbar region 12/23/2013  ? 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 Hyperthyroidism 05/25/2013  ? Obesity (BMI 30-39.9) 12/30/2012  ? 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[place]) 08/21/2011  ? Meningioma ([place]) 08/13/2011  ? DIZZINESS 11/02/2009  ? Type 2 diabetes mellitus, controlled ([place]) 05/20/2009  ? HYPOKALEMIA 05/02/2009  ? DEPRESSION 06/24/2008  ? GERD 06/24/2008  ? PEPTIC ULCER DISEASE  06/24/2008  ? TMJ SYNDROME 06/09/2008  ? Hyperlipemia 11/08/2006  ? OBSTRUCTIVE SLEEP APNEA 11/08/2006  ? Essential hypertension 11/08/2006  ? ALLERGIC RHINITIS 11/08/2006  ? VOCAL CORD DISORDER 11/08/2006  ? ASTHMA 11/08/2006  ? TACHYARRHYTHMIA 11/08/2006  ? ? ?Past Medical History:  ?Diagnosis Date  ? Allergy   ? Anxiety   ? Arthritis   ? Asthma   ? patient denies   ? Cataract   ? bilateral lens implants   ? Colon polyps   ? Depression   ? Diabetes mellitus   ? Type two  ? Diverticulitis   ? Double vision 2019  ? left eye  ? Family history of adverse reaction to anesthesia   ? daughter- N/V   ? Family history of ovarian cancer   ? Family history of pancreatic cancer   ? Frequency of urination   ? GERD (gastroesophageal reflux disease)   ? History of hiatal hernia   ? Hyperglycemia   ? Hyperlipidemia   ? Hypertension   ? Hyperthyroidism   ? PT HAD BIPOSY -NEGATIVE.Marland Kitchen AND NOW JUST GETS CHECKED EACH YEAR .Marland Kitchen 2013 LAST TEST   SEES DR. [name].   ? Multiple meningiomas of spine and brain ([place])   ? Obstructive sleep apnea   ? STUDY AT [facility] DOES NOT USE C PAP  ? Palpitations   ? Peptic ulcer disease   ? Pneumonia   ? "walking pneumonia" hx of  ? Seizures ([place]) 2015  ? one siezure with brain surgery none since 2015   ? Sleep apnea   ? no CPAP  ? Urinary tract infection 03/2020  ? ? ?Past Surgical History:  ?Procedure Laterality Date  ? ABDOMINAL  HYSTERECTOMY    ? ANTERIOR CERVICAL DECOMP/DISCECTOMY FUSION N/A 04/19/2021  ? Procedure: Anterior Cervical Discectomy and Fusion with Interbody Prosthesis, Plates/Screws Cervical Three-Four/Cervical Four-Five REMoval CERVICAL PLATE;  Surgeon: Newman Pies, MD;  Location: [facility];  Service: Neurosurgery;  Laterality: N/A;  3C  ? BACK SURGERY    ? L SPIN 2003   NECK 2004  ? BRAIN MENINGIOMA EXCISION  08/2008  ? X 2  ? C5-C6 neck fusion    ? COLONOSCOPY  05/05/2009  ? pt states multiple polyps removed in both colonoscopies she has had  ? CRANIOTOMY  08/13/2011  ? Procedure:  CRANIOTOMY TUMOR EXCISION;  Surgeon: [name], MD;  Location: [facility];  Service: Neurosurgery;  Laterality: Bilateral;  Bifrontal craniotomy for tumor  ? DILATION AND CURETTAGE OF UTERUS    ? YEARS AGO  ? L4-L5 posterior la  2021  ? L3-L5 fusion  ? left foot plantar and hammertoe    ? right foot bunectomy    ? right knee arthroscopy    ? x 3  ? right shoulder rotator cuff    ? ROBOTIC ASSISTED BILATERAL SALPINGO OOPHERECTOMY N/A 08/25/2015  ? Procedure: XI ROBOTIC ASSISTED BILATERAL SALPINGO OOPHORECTOMY;  Surgeon: [name], MD;  Location: [facility];  Service: Gynecology;  Laterality: N/A;  ? TONSILLECTOMY    ? TUBAL LIGATION    ? WRIST SURGERY Right   ? ? ?Prior to Admission medications   ?Medication Sig Start Date End Date Taking? Authorizing Provider  ?Accu-Chek Softclix Lancets lancets TEST BLOOD SUGAR TWICE DAILY. 02/07/21   Burchette, Alinda Sierras, MD  ?acetaminophen (TYLENOL) 650 MG CR tablet Take 1,300 mg by mouth every 8 (eight) hours as needed for pain.    [provider]  ?amLODipine (NORVASC) 5 MG tablet TAKE ONE (1) TABLET EACH DAY 02/08/21   Burchette, Alinda Sierras, MD  ?ascorbic acid (VITAMIN C) 500 MG tablet Take 500 mg by mouth daily.    [provider]  ?cetirizine (ZYRTEC) 10 MG tablet Take 10 mg by mouth daily as needed for allergies.     [provider]  ?Cyanocobalamin (VITAMIN B12 PO) Place 0.5 mLs under the tongue daily. OTC    [provider]  ?cyclobenzaprine (FLEXERIL) 5 MG tablet Take 1 tablet (5 mg total) by mouth 3 (three) times daily as needed for muscle spasms. 04/21/21   Viona Gilmore D, NP  ?docusate sodium (COLACE) 100 MG capsule Take 1 capsule (100 mg total) by mouth 2 (two) times daily. 04/21/21   Viona Gilmore D, NP  ?furosemide (LASIX) 40 MG tablet Take 0.5 tablets (20 mg total) by mouth daily as needed. For fluid 09/24/14   Burchette, Alinda Sierras, MD  ?glucose blood (ACCU-CHEK AVIVA PLUS) test strip TEST BLOOD SUGAR TWICE DAILY. 04/24/21    Burchette, Alinda Sierras, MD  ?Iron-Vitamins (GERITOL COMPLETE PO) Take 1 tablet by mouth daily.    [provider]  ?JANUMET XR [phone] MG TB24 TAKE 1 TABLET DAILY AT LUNCH 12/22/20   Elayne Snare, MD  ?Lancet Devices [facility]) lancets 1 each by Other route 2 (two) times daily. E11.9 03/31/15   Burchette, Alinda Sierras, MD  ?levothyroxine (SYNTHROID) 75 MCG tablet 1 tablet before breakfast daily with extra half tablet once a week ?Patient taking differently: Take 75-112.5 mcg by mouth See admin instructions. 75 mcg every morning, except Sunday take 112.5 mcg 02/12/21   Elayne Snare, MD  ?losartan-hydrochlorothiazide (HYZAAR) 100-12.5 MG tablet TAKE ONE (1) TABLET EACH DAY ?Patient  taking differently: Take 0.5 tablets by mouth daily. 09/28/20   Burchette, Alinda Sierras, MD  ?Magnesium 250 MG TABS Take 250 mg by mouth daily.    [provider]  ?meclizine (ANTIVERT) 25 MG tablet Take 25 mg by mouth 3 (three) times daily as needed for dizziness.     [provider]  ?metoprolol succinate (TOPROL-XL) 50 MG 24 hr tablet Take 1 tablet (50 mg total) by mouth daily. Take with or immediately following a meal. 11/01/20   Burchette, Alinda Sierras, MD  ?OVER THE COUNTER MEDICATION Apply 1 application. topically daily as needed (neck pain/joint pain). Oxyrub otc pain relieving gel    [provider]  ?oxyCODONE-acetaminophen (PERCOCET) 5-325 MG tablet Take 1-2 tablets by mouth every 4 (four) hours as needed for severe pain. 04/21/21 04/21/22  Viona Gilmore D, NP  ?potassium chloride (KLOR-CON) 10 MEQ tablet TAKE ONE (1) TABLET EACH DAY 01/02/21   Burchette, Alinda Sierras, MD  ?Propylene Glycol (SYSTANE BALANCE) 0.6 % SOLN Place 1 drop into both eyes daily as needed (dry eyes).    [provider]  ?RABEprazole (ACIPHEX) 20 MG tablet TAKE ONE (1) TABLET EACH DAY 02/03/21   Burchette, Alinda Sierras, MD  ?rosuvastatin (CRESTOR) 10 MG tablet Take 10 mg by mouth daily.    [provider]  ?rosuvastatin (CRESTOR)  40 MG tablet TAKE ONE (1) TABLET EACH DAY 04/14/21   Burchette, Alinda Sierras, MD  ?solifenacin (VESICARE) 5 MG tablet TAKE ONE (1) TABLET EACH DAY 02/03/18   Burchette, Alinda Sierras, MD  ?valACYclovir (VALTREX) 1000 MG tablet TA

## 2021-05-29 ENCOUNTER — Encounter (HOSPITAL_COMMUNITY): Payer: Medicare Other

## 2021-06-05 ENCOUNTER — Ambulatory Visit: Admit: 2021-06-05 | Payer: Medicare Other | Admitting: Orthopedic Surgery

## 2021-06-05 DIAGNOSIS — M1712 Unilateral primary osteoarthritis, left knee: Secondary | ICD-10-CM

## 2021-06-05 SURGERY — ARTHROPLASTY, KNEE, TOTAL
Anesthesia: Choice | Site: Knee | Laterality: Right

## 2021-06-08 ENCOUNTER — Ambulatory Visit: Payer: Medicare Other

## 2021-06-24 ENCOUNTER — Other Ambulatory Visit: Payer: Self-pay | Admitting: Endocrinology

## 2021-06-24 ENCOUNTER — Other Ambulatory Visit: Payer: Self-pay | Admitting: Family Medicine

## 2021-07-24 ENCOUNTER — Other Ambulatory Visit: Payer: Self-pay | Admitting: Family Medicine

## 2021-07-24 DIAGNOSIS — E7800 Pure hypercholesterolemia, unspecified: Secondary | ICD-10-CM

## 2021-08-04 ENCOUNTER — Other Ambulatory Visit: Payer: Self-pay | Admitting: Family Medicine

## 2021-08-04 ENCOUNTER — Encounter: Payer: Self-pay | Admitting: Endocrinology

## 2021-08-04 ENCOUNTER — Other Ambulatory Visit: Payer: Self-pay | Admitting: Endocrinology

## 2021-08-04 ENCOUNTER — Ambulatory Visit (INDEPENDENT_AMBULATORY_CARE_PROVIDER_SITE_OTHER): Payer: Medicare Other | Admitting: Endocrinology

## 2021-08-04 VITALS — BP 128/80 | HR 64 | Ht 65.0 in | Wt 222.0 lb

## 2021-08-04 DIAGNOSIS — E669 Obesity, unspecified: Secondary | ICD-10-CM | POA: Diagnosis not present

## 2021-08-04 DIAGNOSIS — E1169 Type 2 diabetes mellitus with other specified complication: Secondary | ICD-10-CM | POA: Diagnosis not present

## 2021-08-04 DIAGNOSIS — E890 Postprocedural hypothyroidism: Secondary | ICD-10-CM | POA: Diagnosis not present

## 2021-08-04 LAB — POCT GLYCOSYLATED HEMOGLOBIN (HGB A1C): Hemoglobin A1C: 6.5 % — AB (ref 4.0–5.6)

## 2021-08-04 LAB — BASIC METABOLIC PANEL
BUN: 19 mg/dL (ref 6–23)
CO2: 26 mEq/L (ref 19–32)
Calcium: 9.1 mg/dL (ref 8.4–10.5)
Chloride: 101 mEq/L (ref 96–112)
Creatinine, Ser: 1.35 mg/dL — ABNORMAL HIGH (ref 0.40–1.20)
GFR: 37.59 mL/min — ABNORMAL LOW (ref >60.00)
Glucose, Bld: 95 mg/dL (ref 70–99)
Potassium: 4.3 mEq/L (ref 3.5–5.1)
Sodium: 137 mEq/L (ref 135–145)

## 2021-08-04 LAB — TSH: TSH: 2.46 u[IU]/mL (ref 0.35–5.50)

## 2021-08-04 LAB — T4, FREE: Free T4: 1.04 ng/dL (ref 0.60–1.60)

## 2021-08-04 NOTE — Patient Instructions (Signed)
Resume exercise

## 2021-08-04 NOTE — Progress Notes (Signed)
Patient ID: Dawn Salas, female   DOB: November 23, 1940, 81 y.o.   MRN: [id number]                                                                                                                  Chief complaint: Endocrinology follow-up   History of Present Illness:   History obtained on initial consultation:  She was diagnosed as having Hyperthyroidism in 2015 when she was having palpitations and was seen to have a goiter. At that time she was having the usual symptoms of fatigue, shakiness, palpitations, heat intolerance and some weight loss No details of this are available She was treated with I-131, unknown dose which was prescribed in March 2015 Apparently she had a large hot nodule on the left, however details of her treatment are not available  Apparently subsequently her hyperthyroidism recurred and methimazole restarted ?  In 4/17 Also the dose was increased up to 7.5 mg in 10/2015  Apparently prior to her being treated for the hot nodule she had a multinodular goiter evaluated with a fine-needle aspiration in 09/2010  Since she was significantly hyperthyroid on her initial exam in February and free T3 was 5.0 and was treated with methimazole Because of persistent hyperthyroidism and very high thyrotropin receptor antibody she was given I-131 treatment with 20.3 mCi on 06/04/16  RECENT history:  She has been HYPOTHYROID as of 07/2016 with a low free T4 level  She was also having some fatigue and cold sensitivity as well as the muscle cramps With starting levothyroxine 125 g she felt better, however subsequently TSH was low again the dose was reduced progressively  She is taking 75g of levothyroxine, 7 1/2 tablets weekly since 01/2021 At that time she was only taking half a tablet daily by mistake She was also complaining of increasing fatigue  Again she thinks she gets tired easily and this is not new, also just recovering from her neck surgery  She has taken her  levothyroxine before breakfast daily and has not missed any doses   She takes her Geritol with iron after breakfast half an hour later  Wt Readings from Last 3 Encounters:  08/04/21 222 lb (100.7 kg)  04/19/21 226 lb (102.5 kg)  04/12/21 226 lb 1.6 oz (102.6 kg)     Thyroid function tests as follows:     Lab Results  Component Value Date   TSH 2.94 03/31/2021   TSH 43.00 verified by dilution (H) 02/02/2021   TSH 5.18 08/02/2020   FREET4 1.32 03/31/2021   FREET4 0.71 02/02/2021   FREET4 1.02 08/02/2020   Graves' ophthalmopathy:  History of diplopia treated by ophthalmologist with wearing prism lenses Has some tearing of the eyes and has been given drops by the ophthalmologist She is followed by ophthalmologist regularly Previous MRI had shown mild thickening of the rectus muscle on the left in 2018  DIABETES: See review of systems    Allergies as of 08/04/2021       Reactions   Flagyl [  metronidazole] Other (See Comments)   Caused increased heart rate   Aspirin Effervescent Swelling   Alka-Seltzer gel caps- sore mouth, face swollen, turned hands white   Morphine Sulfate Hives   Hallucinations   Penicillins Swelling, Rash   Has patient had a PCN reaction causing immediate rash, facial/tongue/throat swelling, SOB or lightheadedness with hypotension: Yes Has patient had a PCN reaction causing severe rash involving mucus membranes or skin necrosis: No Has patient had a PCN reaction that required hospitalization No Has patient had a PCN reaction occurring within the last 10 years: No If all of the above answers are "NO", then may proceed with Cephalosporin use.        Medication List        Accurate as of August 04, 2021 11:41 AM. If you have any questions, ask your nurse or doctor.          Accu-Chek Aviva Plus test strip Generic drug: glucose blood TEST BLOOD SUGAR TWICE DAILY.   accu-chek softclix lancets 1 each by Other route 2 (two) times daily. E11.9    Accu-Chek Softclix Lancets lancets TEST BLOOD SUGAR TWICE DAILY.   acetaminophen 650 MG CR tablet Commonly known as: TYLENOL Take 1,300 mg by mouth every 8 (eight) hours as needed for pain.   amLODipine 5 MG tablet Commonly known as: NORVASC TAKE ONE (1) TABLET EACH DAY   ascorbic acid 500 MG tablet Commonly known as: VITAMIN C Take 500 mg by mouth daily.   cetirizine 10 MG tablet Commonly known as: ZYRTEC Take 10 mg by mouth daily as needed for allergies.   cyclobenzaprine 5 MG tablet Commonly known as: FLEXERIL Take 1 tablet (5 mg total) by mouth 3 (three) times daily as needed for muscle spasms.   docusate sodium 100 MG capsule Commonly known as: COLACE Take 1 capsule (100 mg total) by mouth 2 (two) times daily.   furosemide 40 MG tablet Commonly known as: LASIX Take 0.5 tablets (20 mg total) by mouth daily as needed. For fluid   GERITOL COMPLETE PO Take 1 tablet by mouth daily.   Janumet XR [phone] MG Tb24 Generic drug: SitaGLIPtin-MetFORMIN HCl TAKE 1 TABLET DAILY AT LUNCH   levothyroxine 75 MCG tablet Commonly known as: SYNTHROID 1 tablet before breakfast daily with extra half tablet once a week What changed:  how much to take how to take this when to take this additional instructions   losartan-hydrochlorothiazide 100-12.5 MG tablet Commonly known as: HYZAAR TAKE ONE (1) TABLET EACH DAY What changed: See the new instructions.   Magnesium 250 MG Tabs Take 250 mg by mouth daily.   meclizine 25 MG tablet Commonly known as: ANTIVERT Take 25 mg by mouth 3 (three) times daily as needed for dizziness.   metoprolol succinate 50 MG 24 hr tablet Commonly known as: TOPROL-XL TAKE ONE (1) TABLET BY MOUTH EVERY DAY   OVER THE COUNTER MEDICATION Apply 1 application. topically daily as needed (neck pain/joint pain). Oxyrub otc pain relieving gel   oxyCODONE-acetaminophen 5-325 MG tablet Commonly known as: Percocet Take 1-2 tablets by mouth every 4 (four)  hours as needed for severe pain.   potassium chloride 10 MEQ tablet Commonly known as: KLOR-CON TAKE ONE (1) TABLET EACH DAY   RABEprazole 20 MG tablet Commonly known as: ACIPHEX TAKE ONE (1) TABLET EACH DAY   rosuvastatin 10 MG tablet Commonly known as: CRESTOR Take 10 mg by mouth daily.   rosuvastatin 40 MG tablet Commonly known as: CRESTOR TAKE ONE (1)  TABLET EACH DAY   solifenacin 5 MG tablet Commonly known as: VESICARE TAKE ONE (1) TABLET EACH DAY   Systane Balance 0.6 % Soln Generic drug: Propylene Glycol Place 1 drop into both eyes daily as needed (dry eyes).   valACYclovir 1000 MG tablet Commonly known as: VALTREX TAKE 2 TABS TWICE DAILY FOR 1 DAY THEN AS NEEDED What changed: See the new instructions.   VITAMIN B12 PO Place 0.5 mLs under the tongue daily. OTC            Past Medical History:  Diagnosis Date   Allergy    Anxiety    Arthritis    Asthma    patient denies    Cataract    bilateral lens implants    Colon polyps    Depression    Diabetes mellitus    Type two   Diverticulitis    Double vision 2019   left eye   Family history of adverse reaction to anesthesia    daughter- N/V    Family history of ovarian cancer    Family history of pancreatic cancer    Frequency of urination    GERD (gastroesophageal reflux disease)    History of hiatal hernia    Hyperglycemia    Hyperlipidemia    Hypertension    Hyperthyroidism    PT HAD BIPOSY -NEGATIVE.Marland Kitchen AND NOW JUST GETS CHECKED EACH YEAR .Marland Kitchen 2013 LAST TEST   SEES DR. [name].    Multiple meningiomas of spine and brain ([place])    Obstructive sleep apnea    STUDY AT [facility] DOES NOT USE C PAP   Palpitations    Peptic ulcer disease    Pneumonia    "walking pneumonia" hx of   Seizures ([place]) 2015   one siezure with brain surgery none since 2015    Sleep apnea    no CPAP   Urinary tract infection 03/2020    Past Surgical History:  Procedure Laterality Date   ABDOMINAL HYSTERECTOMY      ANTERIOR CERVICAL DECOMP/DISCECTOMY FUSION N/A 04/19/2021   Procedure: Anterior Cervical Discectomy and Fusion with Interbody Prosthesis, Plates/Screws Cervical Three-Four/Cervical Four-Five REMoval CERVICAL PLATE;  Surgeon: Newman Pies, MD;  Location: [facility];  Service: Neurosurgery;  Laterality: N/A;  3C   BACK SURGERY     L SPIN 2003   NECK 2004   BRAIN MENINGIOMA EXCISION  08/2008   X 2   C5-C6 neck fusion     COLONOSCOPY  05/05/2009   pt states multiple polyps removed in both colonoscopies she has had   CRANIOTOMY  08/13/2011   Procedure: CRANIOTOMY TUMOR EXCISION;  Surgeon: [name], MD;  Location: [facility];  Service: Neurosurgery;  Laterality: Bilateral;  Bifrontal craniotomy for tumor   DILATION AND CURETTAGE OF UTERUS     YEARS AGO   L4-L5 posterior la  2021   L3-L5 fusion   left foot plantar and hammertoe     right foot bunectomy     right knee arthroscopy     x 3   right shoulder rotator cuff     ROBOTIC ASSISTED BILATERAL SALPINGO OOPHERECTOMY N/A 08/25/2015   Procedure: XI ROBOTIC ASSISTED BILATERAL SALPINGO OOPHORECTOMY;  Surgeon: [name], MD;  Location: [facility];  Service: Gynecology;  Laterality: N/A;   TONSILLECTOMY     TUBAL LIGATION     WRIST SURGERY Right     Family History  Problem Relation Age of Onset   Ovarian cancer Mother  Heart disease Father 64   Pancreatic cancer Maternal Aunt    Diabetes Maternal Grandmother    Anesthesia problems Daughter        NAUSEA AND VOMITING POST OP   Arthritis Other    Hyperlipidemia Other    Hypertension Other    Diabetes Other    Breast cancer Neg Hx    Colon cancer Neg Hx    Colon polyps Neg Hx    Esophageal cancer Neg Hx    Stomach cancer Neg Hx    Rectal cancer Neg Hx     Social History:  reports that she has never smoked. She has never used smokeless tobacco. She reports that she does not drink alcohol and does not use drugs.  Allergies:  Allergies  Allergen Reactions   Flagyl  [Metronidazole] Other (See Comments)    Caused increased heart rate   Aspirin Effervescent Swelling    Alka-Seltzer gel caps- sore mouth, face swollen, turned hands white   Morphine Sulfate Hives    Hallucinations   Penicillins Swelling and Rash    Has patient had a PCN reaction causing immediate rash, facial/tongue/throat swelling, SOB or lightheadedness with hypotension: Yes Has patient had a PCN reaction causing severe rash involving mucus membranes or skin necrosis: No Has patient had a PCN reaction that required hospitalization No Has patient had a PCN reaction occurring within the last 10 years: No If all of the above answers are "NO", then may proceed with Cephalosporin use.       Review of Systems    DIABETES   She has had longstanding diabetes, with mild obesity  Previously with relatively higher A1c of 7% and increasing blood sugars she was switched from metformin to Janumet in 6/21  She was also feeling shaky sometimes when taking metformin in the morning  She takes JANUMET around noontime but she still thinks that occasionally she may feel hypoglycemic but does not check her sugar at that time  She has fairly good control with A1c again 6.5  Usually tries to eat healthy meals  Has been asked to make sure she gets some protein with every meal especially breakfast She is not exercising lately but is planning to start, does have a exercise bike at home She has only checked fasting blood sugars as below      Blood sugars from review of the monitor:  Recent readings fasting 104-139  PREVIOUSLY:  PRE-MEAL Fasting Lunch Dinner Bedtime Overall  Glucose range: 93-137      Mean/median:     106   POST-MEAL PC Breakfast PC Lunch PC Dinner  Glucose range:  92 ?  Mean/median:        Wt Readings from Last 3 Encounters:  08/04/21 222 lb (100.7 kg)  04/19/21 226 lb (102.5 kg)  04/12/21 226 lb 1.6 oz (102.6 kg)    Lab Results  Component Value Date   HGBA1C 6.5  (A) 08/04/2021   HGBA1C 6.5 (H) 04/12/2021   HGBA1C 6.2 (A) 01/30/2021   Lab Results  Component Value Date   MICROALBUR 1.2 02/02/2021   LDLCALC 76 11/01/2020   CREATININE 1.37 (H) 04/12/2021   HYPERTENSION: Followed by PCP with usually good control    BP Readings from Last 3 Encounters:  08/04/21 128/80  04/21/21 (!) 145/77  04/12/21 137/72   LIPIDS: She is taking 40 mg simvastatin from her PCP    Examination:   BP 128/80 (BP Location: Left Arm, Patient Position: Sitting, Cuff Size: Large)  Pulse 64   Ht '5\' 5"'$  (1.651 m)   Wt 222 lb (100.7 kg)   SpO2 97%   BMI 36.94 kg/m      Assessment/Plan:  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-131  Her symptoms are difficult to correlate with her thyroid levels as she has other issues going on She has taken her levothyroxine as directed now, taking 7-1/2 tablets a week of the 75 mcg  Weight is down slightly from her last visit  Thyroid labs pending  DIABETES with severe obesity: She has hypertension, diabetes, dyslipidemia  Her blood sugars are controlled with only Janumet  A1c is again 6.5 Again discussed that she can do alternating fasting and postprandial readings instead of just morning readings which she is doing and these are fairly good overall She will also start exercising To continue Janumet once a day Follow-up in 6 months  Recommended follow-up with her PCP for preventative care and monitoring of lipids    There are no Patient Instructions on file for this visit.  Elayne Snare 08/04/2021, 11:41 AM

## 2021-08-27 ENCOUNTER — Other Ambulatory Visit: Payer: Self-pay | Admitting: Family Medicine

## 2021-08-29 ENCOUNTER — Telehealth: Payer: Self-pay | Admitting: Family Medicine

## 2021-08-29 MED ORDER — ACCU-CHEK SOFTCLIX LANCETS MISC: 3 refills | Status: DC

## 2021-08-29 NOTE — Telephone Encounter (Signed)
Rx sent 

## 2021-08-29 NOTE — Telephone Encounter (Signed)
Requesting refill for Accu-Chek Softclix Lancets lancets   [facility] - [place], [place] - [place] Phone:  [phone]  Fax:  [phone]

## 2021-09-01 IMAGING — CT CT L SPINE W/ CM
1 of 6 series · 6 of 14 positions shown, 8 images · non-contrast
Comparison: Lumbar spine MRI 10/01/2018

CLINICAL DATA: Low back pain radiating into the hips with weakness
in the legs.
TECHNIQUE: Contiguous axial images were obtained through the Lumbar spine after
the intrathecal infusion of infusion. Coronal and sagittal
reconstructions were obtained of the axial image sets.

[Series 3: l spine soft (person_name) · axial · 0.35mm/px · z∈[-285,-120]mm · 6 of 79 slices shown, 8 images]
[im 12/79  soft-tissue]
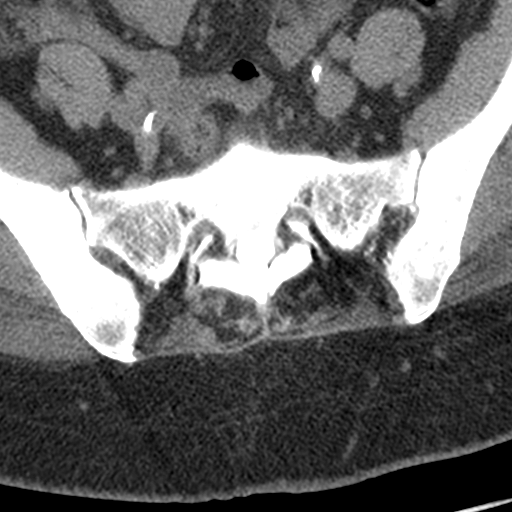
[im 12/79  bone]
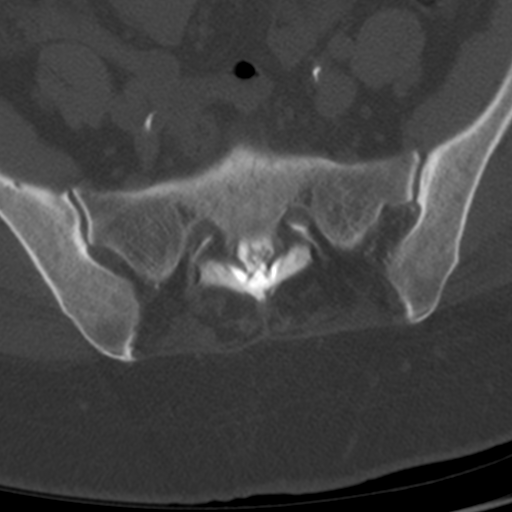
[im 23/79  bone]
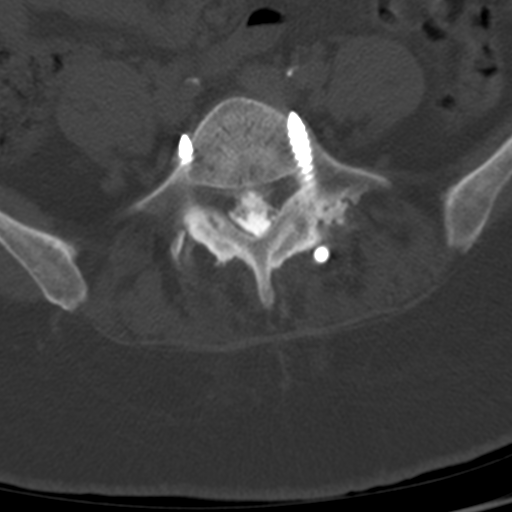
[im 34/79  bone]
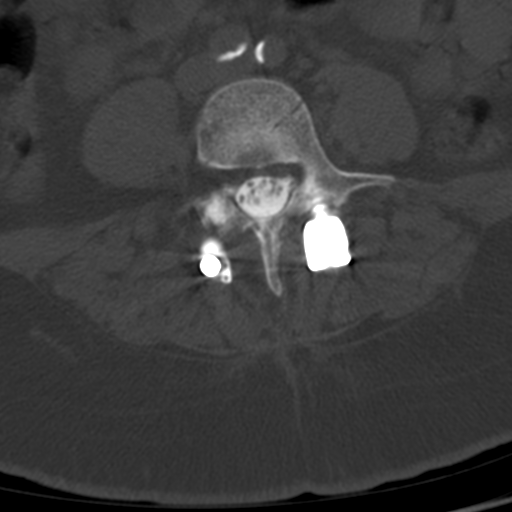
[im 45/79  bone]
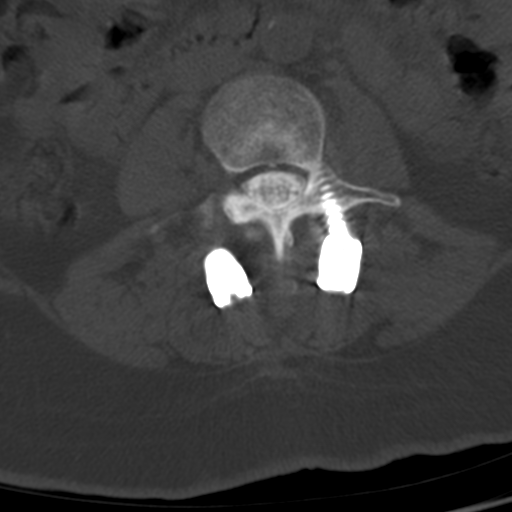
[im 56/79  soft-tissue]
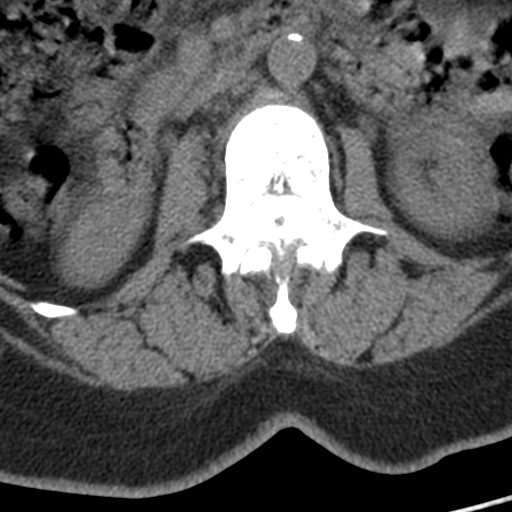
[im 56/79  bone]
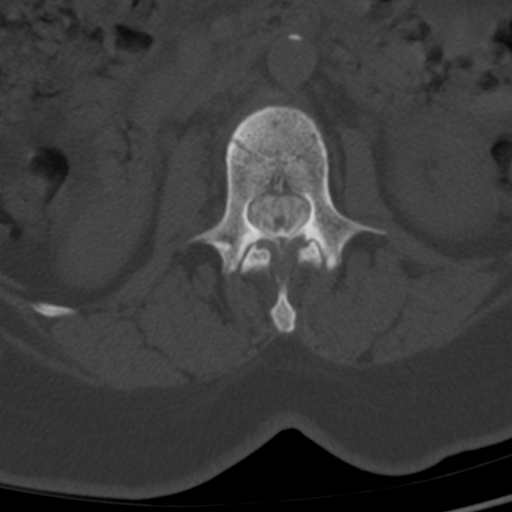
[im 67/79  bone]
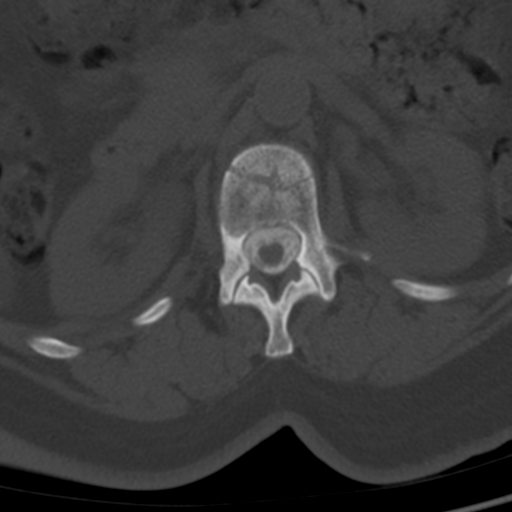

[6 of 14 positions shown; findings below may reference images not displayed]

EXAM:
LUMBAR MYELOGRAM

FLUOROSCOPY TIME:  Fluoroscopy Time: 1 minute 27 seconds

Radiation Exposure Index: 536.09 microGray*m^2

PROCEDURE:
After thorough discussion of risks and benefits of the procedure
including bleeding, infection, injury to nerves, blood vessels,
adjacent structures as well as headache and CSF leak, written and
oral informed consent was obtained. Consent was obtained by Dr.
Jaylon Aujla. Time out form was completed.

Patient was positioned prone on the fluoroscopy table. Local
anesthesia was provided with 1% lidocaine without epinephrine after
prepped and draped in the usual sterile fashion. Puncture was
performed at L5-S1 using a 5 inch 22-gauge spinal needle via a right
interlaminar approach. Despite the needle being appropriately
positioned in the spinal canal based on AP and lateral fluoroscopy,
initially there was no CSF return. After placing the patient in
reverse Trendelenburg position and adjusting the needle, CSF was
eventually obtained. However, contrast injection demonstrated a
mixed pattern with both subarachnoid and epidural components. At
this point, the needle was removed and a new lumbar puncture was
performed at L3-4. Injection at this level demonstrated free flow of
contrast in the subarachnoid space. A total of 15 mL of Isovue J-199
was injected.

I personally performed the lumbar puncture and administered the
intrathecal contrast. I also personally supervised acquisition of
the myelogram images.
FINDINGS: LUMBAR MYELOGRAM FINDINGS:

There are 5 non rib-bearing lumbar type vertebrae. Sequelae of L3-L5
fusion are identified with wide patency of the thecal sac at these
levels as well as at the L1-2 and L5-S1 levels. There is advanced
disc space narrowing at L2-3 with grade 1 retrolisthesis which does
not significantly change with flexion or extension. There is
evidence of moderate spinal stenosis at L2-3 which does not
significantly worsen with standing.

CT LUMBAR MYELOGRAM FINDINGS:

There is mild levoscoliosis with apex at L2-3. Slight retrolisthesis
of L2 on L3 measures 3 mm. There is also minimal anterolisthesis of
L4 on L5 and L5 on S1. Sequelae of L3-L5 fusion are again identified
with solid interbody and posterior element arthrodesis at both
levels. No fracture or suspicious osseous lesion is identified. The
conus medullaris terminates at the lower L2 level, borderline low.
There is abdominal aortic atherosclerosis without aneurysm.

T12-L1: Negative.

L1-2: Minimal disc bulging and mild facet and ligamentum flavum
hypertrophy without stenosis.

L2-3: Advanced disc degeneration asymmetric to the right with severe
disc space narrowing, endplate sclerosis, and vacuum disc.
Circumferential disc bulging and moderate facet and ligamentum
flavum hypertrophy result in moderate spinal stenosis, mild
bilateral lateral recess stenosis, and moderate right and mild left
neural foraminal stenosis, stable to slightly progressed from the
prior MRI.

L3-4: Previous posterior decompression and fusion. No stenosis.

L4-5: Previous posterior decompression and fusion. No stenosis.

L5-S1: Disc bulging and severe facet hypertrophy result in
borderline to mild bilateral neural foraminal stenosis without
spinal stenosis, unchanged.
IMPRESSION: 1. Solid L3-L5 fusion without stenosis.
2. Advanced adjacent segment disease at L2-3 with moderate spinal
stenosis and right greater than left neural foraminal stenosis.
3. Aortic Atherosclerosis ([id number]).

## 2021-09-26 ENCOUNTER — Other Ambulatory Visit: Payer: Self-pay | Admitting: Family Medicine

## 2021-09-26 DIAGNOSIS — Z1231 Encounter for screening mammogram for malignant neoplasm of breast: Secondary | ICD-10-CM

## 2021-10-03 ENCOUNTER — Other Ambulatory Visit: Payer: Self-pay | Admitting: Family Medicine

## 2021-10-05 ENCOUNTER — Ambulatory Visit
Admission: RE | Admit: 2021-10-05 | Discharge: 2021-10-05 | Disposition: A | Discharge status: Home / Self Care | Payer: Medicare Other | Source: Ambulatory Visit | Attending: Family Medicine | Admitting: Family Medicine

## 2021-10-05 DIAGNOSIS — Z1231 Encounter for screening mammogram for malignant neoplasm of breast: Secondary | ICD-10-CM

## 2021-10-06 ENCOUNTER — Encounter: Payer: Self-pay | Admitting: Family Medicine

## 2021-10-06 ENCOUNTER — Ambulatory Visit (INDEPENDENT_AMBULATORY_CARE_PROVIDER_SITE_OTHER): Payer: Medicare Other | Admitting: Family Medicine

## 2021-10-06 VITALS — BP 144/78 | HR 77 | Temp 97.9°F | Ht 65.0 in | Wt 226.3 lb

## 2021-10-06 DIAGNOSIS — I10 Essential (primary) hypertension: Secondary | ICD-10-CM

## 2021-10-06 DIAGNOSIS — Z23 Encounter for immunization: Secondary | ICD-10-CM

## 2021-10-06 DIAGNOSIS — E785 Hyperlipidemia, unspecified: Secondary | ICD-10-CM | POA: Diagnosis not present

## 2021-10-06 LAB — LIPID PANEL
Cholesterol: 172 mg/dL (ref 0–200)
HDL: 84.3 mg/dL (ref >39.00)
LDL Cholesterol: 73 mg/dL (ref 0–99)
NonHDL: 87.3
Total CHOL/HDL Ratio: 2
Triglycerides: 71 mg/dL (ref 0.0–149.0)
VLDL: 14.2 mg/dL (ref 0.0–40.0)

## 2021-10-06 LAB — HEPATIC FUNCTION PANEL
ALT: 13 U/L (ref 0–35)
AST: 21 U/L (ref 0–37)
Albumin: 4.2 g/dL (ref 3.5–5.2)
Alkaline Phosphatase: 84 U/L (ref 39–117)
Bilirubin, Direct: 0.1 mg/dL (ref 0.0–0.3)
Total Bilirubin: 0.9 mg/dL (ref 0.2–1.2)
Total Protein: 6.7 g/dL (ref 6.0–8.3)

## 2021-10-06 NOTE — Addendum Note (Signed)
Addended by: Nilda Riggs on: 10/06/2021 12:45 PM   Modules accepted: Orders

## 2021-10-06 NOTE — Patient Instructions (Signed)
Monitor blood pressure and be in touch if consistently > 140/90.   

## 2021-10-06 NOTE — Progress Notes (Signed)
Established Patient Office Visit  Subjective   Patient ID: Dawn Salas, female    DOB: [birth year]  Age: 81 y.o. MRN: [id number]  Chief Complaint  Patient presents with   Follow-up    HPI   Dawn Salas is here for medical follow-up.  She is followed by endocrinology regarding her hypothyroidism and type 2 diabetes.  Recent TSH at goal.  A1c has been well controlled.  She has hypertension treated with losartan HCTZ, amlodipine, and metoprolol.  Blood pressure up today.  She states she has been eating more salt than usual.  Increased country ham recently.  Does have home cuff but not monitoring.  No recent dizziness.  No peripheral edema.  She had neck surgery around C5 back in March.  Has had some intermittent headaches right frontal and temporal region since then.  Plans to talk to neurosurgeon soon.  Hyperlipidemia treated with rosuvastatin.  She is due for follow-up labs.  No significant myalgias.  Past Medical History:  Diagnosis Date   Allergy    Anxiety    Arthritis    Asthma    patient denies    Cataract    bilateral lens implants    Colon polyps    Depression    Diabetes mellitus    Type two   Diverticulitis    Double vision 2019   left eye   Family history of adverse reaction to anesthesia    daughter- N/V    Family history of ovarian cancer    Family history of pancreatic cancer    Frequency of urination    GERD (gastroesophageal reflux disease)    History of hiatal hernia    Hyperglycemia    Hyperlipidemia    Hypertension    Hyperthyroidism    PT HAD BIPOSY -NEGATIVE.Marland Kitchen AND NOW JUST GETS CHECKED EACH YEAR .Marland Kitchen 2013 LAST TEST   SEES DR. [name].    Multiple meningiomas of spine and brain ([place])    Obstructive sleep apnea    STUDY AT [facility] DOES NOT USE C PAP   Palpitations    Peptic ulcer disease    Pneumonia    "walking pneumonia" hx of   Seizures ([place]) 2015   one siezure with brain surgery none since 2015    Sleep apnea    no CPAP   Urinary tract  infection 03/2020   Past Surgical History:  Procedure Laterality Date   ABDOMINAL HYSTERECTOMY     ANTERIOR CERVICAL DECOMP/DISCECTOMY FUSION N/A 04/19/2021   Procedure: Anterior Cervical Discectomy and Fusion with Interbody Prosthesis, Plates/Screws Cervical Three-Four/Cervical Four-Five REMoval CERVICAL PLATE;  Surgeon: Newman Pies, MD;  Location: [facility];  Service: Neurosurgery;  Laterality: N/A;  3C   BACK SURGERY     L SPIN 2003   NECK 2004   BRAIN MENINGIOMA EXCISION  08/2008   X 2   C5-C6 neck fusion     COLONOSCOPY  05/05/2009   pt states multiple polyps removed in both colonoscopies she has had   CRANIOTOMY  08/13/2011   Procedure: CRANIOTOMY TUMOR EXCISION;  Surgeon: [name], MD;  Location: [facility];  Service: Neurosurgery;  Laterality: Bilateral;  Bifrontal craniotomy for tumor   DILATION AND CURETTAGE OF UTERUS     YEARS AGO   L4-L5 posterior la  2021   L3-L5 fusion   left foot plantar and hammertoe     right foot bunectomy     right knee arthroscopy     x 3   right  shoulder rotator cuff     ROBOTIC ASSISTED BILATERAL SALPINGO OOPHERECTOMY N/A 08/25/2015   Procedure: XI ROBOTIC ASSISTED BILATERAL SALPINGO OOPHORECTOMY;  Surgeon: [name], MD;  Location: [facility];  Service: Gynecology;  Laterality: N/A;   TONSILLECTOMY     TUBAL LIGATION     WRIST SURGERY Right     reports that she has never smoked. She has never used smokeless tobacco. She reports that she does not drink alcohol and does not use drugs. family history includes Anesthesia problems in her daughter; Arthritis in an other family member; Diabetes in her maternal grandmother and another family member; Heart disease (age of onset: 16) in her father; Hyperlipidemia in an other family member; Hypertension in an other family member; Ovarian cancer in her mother; Pancreatic cancer in her maternal aunt. Allergies  Allergen Reactions   Flagyl [Metronidazole] Other (See Comments)    Caused  increased heart rate   Aspirin Effervescent Swelling    Alka-Seltzer gel caps- sore mouth, face swollen, turned hands white   Morphine Sulfate Hives    Hallucinations   Penicillins Swelling and Rash    Has patient had a PCN reaction causing immediate rash, facial/tongue/throat swelling, SOB or lightheadedness with hypotension: Yes Has patient had a PCN reaction causing severe rash involving mucus membranes or skin necrosis: No Has patient had a PCN reaction that required hospitalization No Has patient had a PCN reaction occurring within the last 10 years: No If all of the above answers are "NO", then may proceed with Cephalosporin use.     Review of Systems  Constitutional:  Negative for malaise/fatigue.  Eyes:  Negative for blurred vision.  Respiratory:  Negative for shortness of breath.   Cardiovascular:  Negative for chest pain.  Neurological:  Negative for dizziness, weakness and headaches.      Objective:     BP (!) 144/78 (BP Location: Left Arm, Cuff Size: Large)   Pulse 77   Temp 97.9 F (36.6 C) (Oral)   Ht '5\' 5"'$  (1.651 m)   Wt 226 lb 4.8 oz (102.6 kg)   SpO2 98%   BMI 37.66 kg/m  BP Readings from Last 3 Encounters:  10/06/21 (!) 144/78  08/04/21 128/80  04/21/21 (!) 145/77   Wt Readings from Last 3 Encounters:  10/06/21 226 lb 4.8 oz (102.6 kg)  08/04/21 222 lb (100.7 kg)  04/19/21 226 lb (102.5 kg)      Physical Exam Constitutional:      Appearance: She is well-developed.  Eyes:     Pupils: Pupils are equal, round, and reactive to light.  Neck:     Thyroid: No thyromegaly.     Vascular: No JVD.  Cardiovascular:     Rate and Rhythm: Normal rate and regular rhythm.     Heart sounds:     No gallop.  Pulmonary:     Effort: Pulmonary effort is normal. No respiratory distress.     Breath sounds: Normal breath sounds. No wheezing or rales.  Musculoskeletal:     Cervical back: Neck supple.     Right lower leg: No edema.     Left lower leg: No edema.   Neurological:     Mental Status: She is alert.      No results found for any visits on 10/06/21.  Last CBC Lab Results  Component Value Date   WBC 5.7 04/12/2021   HGB 12.4 04/12/2021   HCT 38.6 04/12/2021   MCV 83.0 04/12/2021   MCH 26.7 04/12/2021  RDW 14.7 04/12/2021   PLT 164 01/24/7251   Last metabolic panel Lab Results  Component Value Date   GLUCOSE 95 08/04/2021   NA 137 08/04/2021   K 4.3 08/04/2021   CL 101 08/04/2021   CO2 26 08/04/2021   BUN 19 08/04/2021   CREATININE 1.35 (H) 08/04/2021   GFRNONAA 40 (L) 04/12/2021   CALCIUM 9.1 08/04/2021   PROT 6.8 11/01/2020   ALBUMIN 4.3 11/01/2020   BILITOT 0.9 11/01/2020   ALKPHOS 79 11/01/2020   AST 25 11/01/2020   ALT 13 11/01/2020   ANIONGAP 8 04/12/2021   Last lipids Lab Results  Component Value Date   CHOL 175 11/01/2020   HDL 83.80 11/01/2020   LDLCALC 76 11/01/2020   LDLDIRECT 195.4 04/15/2012   TRIG 76.0 11/01/2020   CHOLHDL 2 11/01/2020   Last hemoglobin A1c Lab Results  Component Value Date   HGBA1C 6.5 (A) 08/04/2021   Last thyroid functions Lab Results  Component Value Date   TSH 2.46 08/04/2021      The 10-year ASCVD risk score (Arnett DK, et al., 2019) is: 45.4%    Assessment & Plan:   Problem List Items Addressed This Visit       Unprioritized   Hyperlipemia   Relevant Orders   Lipid panel   Hepatic function panel   Essential hypertension - Primary   -Blood pressure up slightly today.  Recent increased sodium consumption. -Rec close home monitoring and be in touch if consistently greater than 140/90. -Handout on DASH diet given.  Try to keep daily sodium intake less than 2500 mg -Try to lose some weight  -Check labs today with lipid and hepatic panel. No follow-ups on file.    Carolann Littler, MD

## 2021-10-23 ENCOUNTER — Other Ambulatory Visit: Payer: Self-pay | Admitting: Family Medicine

## 2021-11-13 ENCOUNTER — Ambulatory Visit (INDEPENDENT_AMBULATORY_CARE_PROVIDER_SITE_OTHER): Payer: Medicare Other

## 2021-11-13 VITALS — Ht 65.0 in | Wt 228.0 lb

## 2021-11-13 DIAGNOSIS — Z0000 Encounter for general adult medical examination without abnormal findings: Secondary | ICD-10-CM | POA: Diagnosis not present

## 2021-11-13 NOTE — Patient Instructions (Signed)
Dawn Salas , Thank you for taking time to come for your Medicare Wellness Visit. I appreciate your ongoing commitment to your health goals. Please review the following plan we discussed and let me know if I can assist you in the future.   Screening recommendations/referrals: Colonoscopy: completed 06/15/2020, due 06/16/2023 Mammogram: completed 10/05/2021, due 10/07/2022 Bone Density: completed 10/14/2019 Recommended yearly ophthalmology/optometry visit for glaucoma screening and checkup Recommended yearly dental visit for hygiene and checkup  Vaccinations: Influenza vaccine: completed 10/06/2021 Pneumococcal vaccine: completed 06/30/2013 Tdap vaccine: completed 06/30/2013, due 07/01/2023 Shingles vaccine: discussed   Covid-19: 09/14/2019, 03/16/2019, 02/03/2019  Advanced directives: Advance directive discussed with you today. .  Conditions/risks identified: none  Next appointment: Follow up in one year for your annual wellness visit    Preventive Care 79 Years and Older, Female Preventive care refers to lifestyle choices and visits with your health care provider that can promote health and wellness. What does preventive care include? A yearly physical exam. This is also called an annual well check. Dental exams once or twice a year. Routine eye exams. Ask your health care provider how often you should have your eyes checked. Personal lifestyle choices, including: Daily care of your teeth and gums. Regular physical activity. Eating a healthy diet. Avoiding tobacco and drug use. Limiting alcohol use. Practicing safe sex. Taking low-dose aspirin every day. Taking vitamin and mineral supplements as recommended by your health care provider. What happens during an annual well check? The services and screenings done by your health care provider during your annual well check will depend on your age, overall health, lifestyle risk factors, and family history of disease. Counseling  Your health  care provider may ask you questions about your: Alcohol use. Tobacco use. Drug use. Emotional well-being. Home and relationship well-being. Sexual activity. Eating habits. History of falls. Memory and ability to understand (cognition). Work and work Statistician. Reproductive health. Screening  You may have the following tests or measurements: Height, weight, and BMI. Blood pressure. Lipid and cholesterol levels. These may be checked every 5 years, or more frequently if you are over 36 years old. Skin check. Lung cancer screening. You may have this screening every year starting at age 79 if you have a 30-pack-year history of smoking and currently smoke or have quit within the past 15 years. Fecal occult blood test (FOBT) of the stool. You may have this test every year starting at age 79. Flexible sigmoidoscopy or colonoscopy. You may have a sigmoidoscopy every 5 years or a colonoscopy every 10 years starting at age 79. Hepatitis C blood test. Hepatitis B blood test. Sexually transmitted disease (STD) testing. Diabetes screening. This is done by checking your blood sugar (glucose) after you have not eaten for a while (fasting). You may have this done every 1-3 years. Bone density scan. This is done to screen for osteoporosis. You may have this done starting at age 79. Mammogram. This may be done every 1-2 years. Talk to your health care provider about how often you should have regular mammograms. Talk with your health care provider about your test results, treatment options, and if necessary, the need for more tests. Vaccines  Your health care provider may recommend certain vaccines, such as: Influenza vaccine. This is recommended every year. Tetanus, diphtheria, and acellular pertussis (Tdap, Td) vaccine. You may need a Td booster every 10 years. Zoster vaccine. You may need this after age 79. Pneumococcal 13-valent conjugate (PCV13) vaccine. One dose is recommended after age  79. Pneumococcal  polysaccharide (PPSV23) vaccine. One dose is recommended after age 79. Talk to your health care provider about which screenings and vaccines you need and how often you need them. This information is not intended to replace advice given to you by your health care provider. Make sure you discuss any questions you have with your health care provider. Document Released: 02/04/2015 Document Revised: 09/28/2015 Document Reviewed: 11/09/2014 Elsevier Interactive Patient Education  2017 [place] Prevention in the Home Falls can cause injuries. They can happen to people of all ages. There are many things you can do to make your home safe and to help prevent falls. What can I do on the outside of my home? Regularly fix the edges of walkways and driveways and fix any cracks. Remove anything that might make you trip as you walk through a door, such as a raised step or threshold. Trim any bushes or trees on the path to your home. Use bright outdoor lighting. Clear any walking paths of anything that might make someone trip, such as rocks or tools. Regularly check to see if handrails are loose or broken. Make sure that both sides of any steps have handrails. Any raised decks and porches should have guardrails on the edges. Have any leaves, snow, or ice cleared regularly. Use sand or salt on walking paths during winter. Clean up any spills in your garage right away. This includes oil or grease spills. What can I do in the bathroom? Use night lights. Install grab bars by the toilet and in the tub and shower. Do not use towel bars as grab bars. Use non-skid mats or decals in the tub or shower. If you need to sit down in the shower, use a plastic, non-slip stool. Keep the floor dry. Clean up any water that spills on the floor as soon as it happens. Remove soap buildup in the tub or shower regularly. Attach bath mats securely with double-sided non-slip rug tape. Do not have throw  rugs and other things on the floor that can make you trip. What can I do in the bedroom? Use night lights. Make sure that you have a light by your bed that is easy to reach. Do not use any sheets or blankets that are too big for your bed. They should not hang down onto the floor. Have a firm chair that has side arms. You can use this for support while you get dressed. Do not have throw rugs and other things on the floor that can make you trip. What can I do in the kitchen? Clean up any spills right away. Avoid walking on wet floors. Keep items that you use a lot in easy-to-reach places. If you need to reach something above you, use a strong step stool that has a grab bar. Keep electrical cords out of the way. Do not use floor polish or wax that makes floors slippery. If you must use wax, use non-skid floor wax. Do not have throw rugs and other things on the floor that can make you trip. What can I do with my stairs? Do not leave any items on the stairs. Make sure that there are handrails on both sides of the stairs and use them. Fix handrails that are broken or loose. Make sure that handrails are as long as the stairways. Check any carpeting to make sure that it is firmly attached to the stairs. Fix any carpet that is loose or worn. Avoid having throw rugs at the top or  bottom of the stairs. If you do have throw rugs, attach them to the floor with carpet tape. Make sure that you have a light switch at the top of the stairs and the bottom of the stairs. If you do not have them, ask someone to add them for you. What else can I do to help prevent falls? Wear shoes that: Do not have high heels. Have rubber bottoms. Are comfortable and fit you well. Are closed at the toe. Do not wear sandals. If you use a stepladder: Make sure that it is fully opened. Do not climb a closed stepladder. Make sure that both sides of the stepladder are locked into place. Ask someone to hold it for you, if  possible. Clearly mark and make sure that you can see: Any grab bars or handrails. First and last steps. Where the edge of each step is. Use tools that help you move around (mobility aids) if they are needed. These include: Canes. Walkers. Scooters. Crutches. Turn on the lights when you go into a dark area. Replace any light bulbs as soon as they burn out. Set up your furniture so you have a clear path. Avoid moving your furniture around. If any of your floors are uneven, fix them. If there are any pets around you, be aware of where they are. Review your medicines with your doctor. Some medicines can make you feel dizzy. This can increase your chance of falling. Ask your doctor what other things that you can do to help prevent falls. This information is not intended to replace advice given to you by your health care provider. Make sure you discuss any questions you have with your health care provider. Document Released: 11/04/2008 Document Revised: 06/16/2015 Document Reviewed: 02/12/2014 Elsevier Interactive Patient Education  2017 [employer].

## 2021-11-13 NOTE — Progress Notes (Signed)
I connected with Marjory Sneddon today by telephone and verified that I am speaking with the correct person using two identifiers. Location patient: home Location provider: work Persons participating in the virtual visit: Marjory Sneddon, Glenna Durand LPN.   I discussed the limitations, risks, security and privacy concerns of performing an evaluation and management service by telephone and the availability of in person appointments. I also discussed with the patient that there may be a patient responsible charge related to this service. The patient expressed understanding and verbally consented to this telephonic visit.    Interactive audio and video telecommunications were attempted between this provider and patient, however failed, due to patient having technical difficulties OR patient did not have access to video capability.  We continued and completed visit with audio only.     Vital signs may be patient reported or missing.  Subjective:   Dawn Salas is a 81 y.o. female who presents for Medicare Annual (Subsequent) preventive examination.  Review of Systems     Cardiac Risk Factors include: advanced age (>71mn, >>49women);diabetes mellitus;dyslipidemia;hypertension;obesity (BMI >30kg/m2)     Objective:    Today's Vitals   11/13/21 1056 11/13/21 1057  Weight: 228 lb (103.4 kg)   Height: '5\' 5"'$  (1.651 m)   PainSc:  2    Body mass index is 37.94 kg/m.     11/13/2021   11:04 AM 04/12/2021   11:17 AM 09/23/2020    2:48 PM 12/28/2019   12:36 PM 09/21/2019    2:55 PM 07/02/2019    2:25 PM 06/29/2019   11:26 AM  Advanced Directives  Does Patient Have a Medical Advance Directive? No No No No No No No  Would patient like information on creating a medical advance directive?  No - Patient declined No - Patient declined  No - Patient declined No - Patient declined No - Patient declined    Current Medications (verified) Outpatient Encounter Medications as of 11/13/2021  Medication Sig    Accu-Chek Softclix Lancets lancets TEST BLOOD SUGAR TWICE DAILY.   acetaminophen (TYLENOL) 650 MG CR tablet Take 1,300 mg by mouth every 8 (eight) hours as needed for pain.   amLODipine (NORVASC) 5 MG tablet TAKE ONE (1) TABLET EACH DAY   ascorbic acid (VITAMIN C) 500 MG tablet Take 500 mg by mouth daily.   cetirizine (ZYRTEC) 10 MG tablet Take 10 mg by mouth daily as needed for allergies.    furosemide (LASIX) 40 MG tablet Take 0.5 tablets (20 mg total) by mouth daily as needed. For fluid   glucose blood (ACCU-CHEK AVIVA PLUS) test strip TEST BLOOD SUGAR TWICE DAILY.   Iron-Vitamins (GERITOL COMPLETE PO) Take 1 tablet by mouth daily.   JANUMET XR [phone] MG TB24 TAKE 1 TABLET DAILY AT LUNCH   Lancet Devices (ACCU-CHEK SOFTCLIX) lancets 1 each by Other route 2 (two) times daily. E11.9   levothyroxine (SYNTHROID) 75 MCG tablet Take 1 tablet daily and 1-1/2 tablet on Sunday   losartan-hydrochlorothiazide (HYZAAR) 100-12.5 MG tablet TAKE ONE (1) TABLET EACH DAY   Magnesium 250 MG TABS Take 250 mg by mouth daily.   meclizine (ANTIVERT) 25 MG tablet Take 25 mg by mouth 3 (three) times daily as needed for dizziness.    metoprolol succinate (TOPROL-XL) 50 MG 24 hr tablet TAKE ONE (1) TABLET BY MOUTH EVERY DAY   OVER THE COUNTER MEDICATION Apply 1 application. topically daily as needed (neck pain/joint pain). Oxyrub otc pain relieving gel   potassium chloride (KLOR-CON) 10  MEQ tablet TAKE ONE (1) TABLET EACH DAY   Propylene Glycol (SYSTANE BALANCE) 0.6 % SOLN Place 1 drop into both eyes daily as needed (dry eyes).   RABEprazole (ACIPHEX) 20 MG tablet TAKE ONE (1) TABLET EACH DAY   rosuvastatin (CRESTOR) 10 MG tablet Take 10 mg by mouth daily.   solifenacin (VESICARE) 5 MG tablet TAKE ONE (1) TABLET EACH DAY   valACYclovir (VALTREX) 1000 MG tablet TAKE 2 TABS TWICE DAILY FOR 1 DAY THEN AS NEEDED (Patient taking differently: Take 1,000-2,000 mg by mouth daily as needed (cold sores).)   rosuvastatin  (CRESTOR) 40 MG tablet TAKE ONE (1) TABLET EACH DAY (Patient not taking: Reported on 11/13/2021)   [DISCONTINUED] venlafaxine (EFFEXOR XR) 37.5 MG 24 hr capsule Take 1 capsule (37.5 mg total) by mouth daily.   No facility-administered encounter medications on file as of 11/13/2021.    Allergies (verified) Flagyl [metronidazole], Aspirin effervescent, Morphine sulfate, and Penicillins   History: Past Medical History:  Diagnosis Date   Allergy    Anxiety    Arthritis    Asthma    patient denies    Cataract    bilateral lens implants    Colon polyps    Depression    Diabetes mellitus    Type two   Diverticulitis    Double vision 2019   left eye   Family history of adverse reaction to anesthesia    daughter- N/V    Family history of ovarian cancer    Family history of pancreatic cancer    Frequency of urination    GERD (gastroesophageal reflux disease)    History of hiatal hernia    Hyperglycemia    Hyperlipidemia    Hypertension    Hyperthyroidism    PT HAD BIPOSY -NEGATIVE.Marland Kitchen AND NOW JUST GETS CHECKED EACH YEAR .Marland Kitchen 2013 LAST TEST   SEES DR. [name].    Multiple meningiomas of spine and brain ([place])    Obstructive sleep apnea    STUDY AT [facility] DOES NOT USE C PAP   Palpitations    Peptic ulcer disease    Pneumonia    "walking pneumonia" hx of   Seizures ([place]) 2015   one siezure with brain surgery none since 2015    Sleep apnea    no CPAP   Urinary tract infection 03/2020   Past Surgical History:  Procedure Laterality Date   ABDOMINAL HYSTERECTOMY     ANTERIOR CERVICAL DECOMP/DISCECTOMY FUSION N/A 04/19/2021   Procedure: Anterior Cervical Discectomy and Fusion with Interbody Prosthesis, Plates/Screws Cervical Three-Four/Cervical Four-Five REMoval CERVICAL PLATE;  Surgeon: Newman Pies, MD;  Location: [facility];  Service: Neurosurgery;  Laterality: N/A;  3C   BACK SURGERY     L SPIN 2003   NECK 2004   BRAIN MENINGIOMA EXCISION  08/2008   X 2   C5-C6 neck fusion      COLONOSCOPY  05/05/2009   pt states multiple polyps removed in both colonoscopies she has had   CRANIOTOMY  08/13/2011   Procedure: CRANIOTOMY TUMOR EXCISION;  Surgeon: [name], MD;  Location: [facility];  Service: Neurosurgery;  Laterality: Bilateral;  Bifrontal craniotomy for tumor   DILATION AND CURETTAGE OF UTERUS     YEARS AGO   L4-L5 posterior la  2021   L3-L5 fusion   left foot plantar and hammertoe     right foot bunectomy     right knee arthroscopy     x 3   right shoulder rotator  cuff     ROBOTIC ASSISTED BILATERAL SALPINGO OOPHERECTOMY N/A 08/25/2015   Procedure: XI ROBOTIC ASSISTED BILATERAL SALPINGO OOPHORECTOMY;  Surgeon: [name], MD;  Location: [facility];  Service: Gynecology;  Laterality: N/A;   TONSILLECTOMY     TUBAL LIGATION     WRIST SURGERY Right    Family History  Problem Relation Age of Onset   Ovarian cancer Mother    Heart disease Father 29   Pancreatic cancer Maternal Aunt    Diabetes Maternal Grandmother    Anesthesia problems Daughter        NAUSEA AND VOMITING POST OP   Arthritis Other    Hyperlipidemia Other    Hypertension Other    Diabetes Other    Breast cancer Neg Hx    Colon cancer Neg Hx    Colon polyps Neg Hx    Esophageal cancer Neg Hx    Stomach cancer Neg Hx    Rectal cancer Neg Hx    Social History   Socioeconomic History   Marital status: Married    Spouse name: Not on file   Number of children: 6   Years of education: Not on file   Highest education level: Not on file  Occupational History   Not on file  Tobacco Use   Smoking status: Never   Smokeless tobacco: Never  Vaping Use   Vaping Use: Never used  Substance and Sexual Activity   Alcohol use: No   Drug use: No   Sexual activity: Never  Other Topics Concern   Not on file  Social History Narrative   Not on file   Social Determinants of Health   Financial Resource Strain: Low Risk  (11/13/2021)   Overall Financial Resource Strain (CARDIA)     Difficulty of Paying Living Expenses: Not hard at all  Food Insecurity: No Food Insecurity (11/13/2021)   Hunger Vital Sign    Worried About Running Out of Food in the Last Year: Never true    Ran Out of Food in the Last Year: Never true  Transportation Needs: No Transportation Needs (11/13/2021)   PRAPARE - Hydrologist (Medical): No    Lack of Transportation (Non-Medical): No  Physical Activity: Sufficiently Active (11/13/2021)   Exercise Vital Sign    Days of Exercise per Week: 3 days    Minutes of Exercise per Session: 60 min  Stress: No Stress Concern Present (11/13/2021)   [place]    Feeling of Stress : Only a little  Social Connections: Socially Integrated (09/23/2020)   Social Connection and Isolation Panel [NHANES]    Frequency of Communication with Friends and Family: Three times a week    Frequency of Social Gatherings with Friends and Family: Three times a week    Attends Religious Services: More than 4 times per year    Active Member of Clubs or Organizations: Yes    Attends Archivist Meetings: 1 to 4 times per year    Marital Status: Married    Tobacco Counseling Counseling given: Not Answered   Clinical Intake:  Pre-visit preparation completed: Yes  Pain : 0-10 Pain Score: 2  Pain Type: Acute pain Pain Location: Neck Pain Descriptors / Indicators: Aching Pain Onset: More than a month ago Pain Frequency: Constant     Nutritional Status: BMI > 30  Obese Nutritional Risks: None Diabetes: Yes  How often do you need to have someone help you  when you read instructions, pamphlets, or other written materials from your doctor or pharmacy?: 1 - Never What is the last grade level you completed in school?: GED  Diabetic? Yes Nutrition Risk Assessment:  Has the patient had any N/V/D within the last 2 months?  No  Does the patient have any non-healing  wounds?  No  Has the patient had any unintentional weight loss or weight gain?  No   Diabetes:  Is the patient diabetic?  Yes  If diabetic, was a CBG obtained today?  No  Did the patient bring in their glucometer from home?  No  How often do you monitor your CBG's? Twice daily.   Financial Strains and Diabetes Management:  Are you having any financial strains with the device, your supplies or your medication? No .  Does the patient want to be seen by Chronic Care Management for management of their diabetes?  No  Would the patient like to be referred to a Nutritionist or for Diabetic Management?  No   Diabetic Exams:  Diabetic Eye Exam: Completed 02/02/2021 Diabetic Foot Exam: Overdue, Pt has been advised about the importance in completing this exam. Pt is scheduled for diabetic foot exam on next appointment.   Interpreter Needed?: No  Information entered by :: NAllen LPN   Activities of Daily Living    11/13/2021   11:06 AM 04/12/2021   11:21 AM  In your present state of health, do you have any difficulty performing the following activities:  Hearing? 0   Vision? 1   Comment double vision has glasses that correct that   Difficulty concentrating or making decisions? 0   Walking or climbing stairs? 0   Dressing or bathing? 0   Doing errands, shopping? 0 0  Preparing Food and eating ? N   Using the Toilet? N   In the past six months, have you accidently leaked urine? N   Do you have problems with loss of bowel control? N   Managing your Medications? N   Managing your Finances? N   Housekeeping or managing your Housekeeping? N     Patient Care Team: Eulas Post, MD as PCP - General (Family Medicine)  Indicate any recent Medical Services you may have received from other than [facility] providers in the past year (date may be approximate).     Assessment:   This is a routine wellness examination for Deandre.  Hearing/Vision screen Vision Screening - Comments::  Regular eye exams, [facility]  Dietary issues and exercise activities discussed: Current Exercise Habits: Home exercise routine, Type of exercise: walking;Other - see comments (stationary bike), Time (Minutes): 60, Frequency (Times/Week): 3, Weekly Exercise (Minutes/Week): 180   Goals Addressed             This Visit's Progress    Patient Stated       11/13/2021, no goals       Depression Screen    11/13/2021   11:06 AM 10/06/2021    9:29 AM 01/30/2021    3:41 PM 09/23/2020    2:50 PM 09/23/2020    2:46 PM 02/03/2020   10:42 AM 09/21/2019    2:57 PM  PHQ 2/9 Scores  PHQ - 2 Score 0 2 0 0 0 0 0  PHQ- 9 Score  8     0    Fall Risk    11/13/2021   11:05 AM 10/06/2021    9:28 AM 01/30/2021    3:41 PM 09/23/2020  2:49 PM 02/03/2020   10:42 AM  Fall Risk   Falls in the past year? 0 0 1 0 0  Number falls in past yr: 0 0 1 0   Injury with Fall? 0 0  0   Risk for fall due to : Medication side effect;Impaired vision No Fall Risks  No Fall Risks   Risk for fall due to: Comment    uses cane   Follow up Falls prevention discussed;Falls evaluation completed;Education provided Falls evaluation completed  Falls evaluation completed     FALL RISK PREVENTION PERTAINING TO THE HOME:  Any stairs in or around the home? Yes  If so, are there any without handrails? No  Home free of loose throw rugs in walkways, pet beds, electrical cords, etc? Yes  Adequate lighting in your home to reduce risk of falls? Yes   ASSISTIVE DEVICES UTILIZED TO PREVENT FALLS:  Life alert? No  Use of a cane, walker or w/c? Yes  Grab bars in the bathroom? Yes  Shower chair or bench in shower? Yes  Elevated toilet seat or a handicapped toilet? Yes   TIMED UP AND GO:  Was the test performed? No .       Cognitive Function:        11/13/2021   11:08 AM  6CIT Screen  What Year? 0 points  What month? 0 points  What time? 0 points  Count back from 20 0 points  Months in reverse 0 points  Repeat  phrase 0 points  Total Score 0 points    Immunizations Immunization History  Administered Date(s) Administered   Fluad Quad(high Dose 65+) 09/30/2018, 11/02/2019, 11/01/2020, 10/06/2021   Influenza Split 10/30/2010, 10/17/2011, 10/22/2016   Influenza Whole 12/06/2008, 11/02/2009   Influenza, High Dose Seasonal PF 10/03/2015, 10/22/2017, 11/01/2019   Influenza,inj,Quad PF,6+ Mos 11/10/2012, 11/10/2013   Moderna Sars-Covid-2 Vaccination 02/03/2019, 03/16/2019, 09/14/2019   Pneumococcal Conjugate-13 06/30/2013   Pneumococcal Polysaccharide-23 08/23/2008   Td 10/26/2003   Tdap 06/30/2013    TDAP status: Up to date  Flu Vaccine status: Up to date  Pneumococcal vaccine status: Up to date  Covid-19 vaccine status: Completed vaccines  Qualifies for Shingles Vaccine? Yes   Zostavax completed No   Shingrix Completed?: No.    Education has been provided regarding the importance of this vaccine. Patient has been advised to call insurance company to determine out of pocket expense if they have not yet received this vaccine. Advised may also receive vaccine at local pharmacy or Health Dept. Verbalized acceptance and understanding.  Screening Tests Health Maintenance  Topic Date Due   Hepatitis C Screening  Never done   Zoster Vaccines- Shingrix (1 of 2) Never done   COVID-19 Vaccine (4 - Moderna risk series) 11/09/2019   FOOT EXAM  12/30/2019   Diabetic kidney evaluation - Urine ACR  02/02/2022   OPHTHALMOLOGY EXAM  02/02/2022   HEMOGLOBIN A1C  02/04/2022   Diabetic kidney evaluation - GFR measurement  08/05/2022   COLONOSCOPY (Pts 45-72yr Insurance coverage will need to be confirmed)  06/16/2023   TETANUS/TDAP  07/01/2023   Pneumonia Vaccine 81 Years old  Completed   INFLUENZA VACCINE  Completed   DEXA SCAN  Completed   HPV VACCINES  Aged Out    Health Maintenance  Health Maintenance Due  Topic Date Due   Hepatitis C Screening  Never done   Zoster Vaccines- Shingrix (1 of  2) Never done   COVID-19 Vaccine (4 - Moderna risk series) 11/09/2019  FOOT EXAM  12/30/2019    Colorectal cancer screening: Type of screening: Colonoscopy. Completed 06/15/2020. Repeat every 3 years  Mammogram status: Completed 10/05/2021. Repeat every year  Bone Density status: Completed 10/14/2019.   Lung Cancer Screening: (Low Dose CT Chest recommended if Age 74-80 years, 30 pack-year currently smoking OR have quit w/in 15years.) does not qualify.   Lung Cancer Screening Referral: no  Additional Screening:  Hepatitis C Screening: does qualify;  Vision Screening: Recommended annual ophthalmology exams for early detection of glaucoma and other disorders of the eye. Is the patient up to date with their annual eye exam?  Yes  Who is the provider or what is the name of the office in which the patient attends annual eye exams? [facility] If pt is not established with a provider, would they like to be referred to a provider to establish care? No .   Dental Screening: Recommended annual dental exams for proper oral hygiene  Community Resource Referral / Chronic Care Management: CRR required this visit?  No   CCM required this visit?  No      Plan:     I have personally reviewed and noted the following in the patient's chart:   Medical and social history Use of alcohol, tobacco or illicit drugs  Current medications and supplements including opioid prescriptions. Patient is not currently taking opioid prescriptions. Functional ability and status Nutritional status Physical activity Advanced directives List of other physicians Hospitalizations, surgeries, and ER visits in previous 12 months Vitals Screenings to include cognitive, depression, and falls Referrals and appointments  In addition, I have reviewed and discussed with patient certain preventive protocols, quality metrics, and best practice recommendations. A written personalized care plan for preventive services  as well as general preventive health recommendations were provided to patient.     Kellie Simmering, LPN   [id number]   Nurse Notes: none  Due to this being a virtual visit, the after visit summary with patients personalized plan was offered to patient via mail or my-chart.  Patient would like to access on my-chart

## 2021-11-15 ENCOUNTER — Other Ambulatory Visit: Payer: Self-pay | Admitting: Family Medicine

## 2021-11-15 DIAGNOSIS — E7800 Pure hypercholesterolemia, unspecified: Secondary | ICD-10-CM

## 2021-11-22 ENCOUNTER — Other Ambulatory Visit: Payer: Self-pay | Admitting: Family Medicine

## 2021-12-19 ENCOUNTER — Telehealth: Payer: Self-pay | Admitting: Cardiology

## 2021-12-19 NOTE — Telephone Encounter (Signed)
   Pre-operative Risk Assessment    Patient Name: Dawn Salas  DOB: 1940-11-12 MRN: [id number]      Request for Surgical Clearance    Procedure:   Rt total knee arthroplasty   Date of Surgery:  Clearance 03/19/22                                 Surgeon:  Dr. Gaynelle Arabian Surgeon's Group or Practice Name:  [facility] Phone number:  [phone] Fax number:  [phone]   Type of Clearance Requested:   - Medical    Type of Anesthesia:   Choice    Additional requests/questions:    Sandrea Hammond   12/19/2021, 1:14 PM

## 2021-12-20 NOTE — Telephone Encounter (Signed)
   Name: [name]  DOB: 1940/10/08  MRN: [id number]  Primary Cardiologist: None   Preoperative team, please contact this patient and set up a phone call appointment for further preoperative risk assessment. Please obtain consent and complete medication review. Thank you for your help.  I confirm that guidance regarding antiplatelet and oral anticoagulation therapy has been completed and, if necessary, noted below.( None requested)   Deberah Pelton, NP 12/20/2021, 8:40 AM [place]

## 2021-12-21 ENCOUNTER — Telehealth: Payer: Self-pay | Admitting: *Deleted

## 2021-12-21 NOTE — Telephone Encounter (Signed)
  Patient Consent for Virtual Visit         Dawn Salas has provided verbal consent on 12/21/2021 for a virtual visit (video or telephone).   CONSENT FOR VIRTUAL VISIT FOR:  Dawn Salas  By participating in this virtual visit I agree to the following:  I hereby voluntarily request, consent and authorize [facility] and its employed or contracted physicians, physician assistants, nurse practitioners or other licensed health care professionals (the Practitioner), to provide me with telemedicine health care services (the "Services") as deemed necessary by the treating Practitioner. I acknowledge and consent to receive the Services by the Practitioner via telemedicine. I understand that the telemedicine visit will involve communicating with the Practitioner through live audiovisual communication technology and the disclosure of certain medical information by electronic transmission. I acknowledge that I have been given the opportunity to request an in-person assessment or other available alternative prior to the telemedicine visit and am voluntarily participating in the telemedicine visit.  I understand that I have the right to withhold or withdraw my consent to the use of telemedicine in the course of my care at any time, without affecting my right to future care or treatment, and that the Practitioner or I may terminate the telemedicine visit at any time. I understand that I have the right to inspect all information obtained and/or recorded in the course of the telemedicine visit and may receive copies of available information for a reasonable fee.  I understand that some of the potential risks of receiving the Services via telemedicine include:  Delay or interruption in medical evaluation due to technological equipment failure or disruption; Information transmitted may not be sufficient (e.g. poor resolution of images) to allow for appropriate medical decision making by the  Practitioner; and/or  In rare instances, security protocols could fail, causing a breach of personal health information.  Furthermore, I acknowledge that it is my responsibility to provide information about my medical history, conditions and care that is complete and accurate to the best of my ability. I acknowledge that Practitioner's advice, recommendations, and/or decision may be based on factors not within their control, such as incomplete or inaccurate data provided by me or distortions of diagnostic images or specimens that may result from electronic transmissions. I understand that the practice of medicine is not an exact science and that Practitioner makes no warranties or guarantees regarding treatment outcomes. I acknowledge that a copy of this consent can be made available to me via my patient portal ([place]), or I can request a printed copy by calling the office of [facility].    I understand that my insurance will be billed for this visit.   I have read or had this consent read to me. I understand the contents of this consent, which adequately explains the benefits and risks of the Services being provided via telemedicine.  I have been provided ample opportunity to ask questions regarding this consent and the Services and have had my questions answered to my satisfaction. I give my informed consent for the services to be provided through the use of telemedicine in my medical care

## 2021-12-21 NOTE — Telephone Encounter (Signed)
Telephone appt made. Med rec and consent done.

## 2021-12-23 ENCOUNTER — Other Ambulatory Visit: Payer: Self-pay | Admitting: Endocrinology

## 2021-12-25 ENCOUNTER — Ambulatory Visit: Payer: Medicare Other | Admitting: Physician Assistant

## 2021-12-25 NOTE — Telephone Encounter (Signed)
   Name: Dawn Salas  DOB: 1940/08/19  MRN: [id number]  Primary Cardiologist: Carlyle Dolly, MD  Chart reviewed as part of pre-operative protocol coverage. Because of JOSCELIN FRAY past medical history and time since last visit, she will require a follow-up in-office visit in order to better assess preoperative cardiovascular risk. Last OV 03/02/21. By the time of surgery she will be over 1 year since last OV therefore I would suggest we instead try to schedule her with a late January appointment for pre-op eval in the office instead of televisit.   Pre-op covering staff: - Please schedule appointment and call patient to inform them. - Please contact requesting surgeon's office via preferred method (i.e, phone, fax) to inform them of need for appointment prior to surgery.  No specific meds listed to hold.  Charlie Pitter, PA-C  12/25/2021, 7:57 AM

## 2021-12-25 NOTE — Telephone Encounter (Signed)
I s/w the pt and explained per Melina Copa, PAC pre op APP today who states that pt needs in office appt and not tele. Pt is agreeable to plan of care and to change appt to in offic. Pt has been scheduled in the [place] office 01/29/22 @ 9 am with Finis Bud, NP same day Dr. Harl Bowie is in the office. I will cancel the tele appt for today. Pt is grateful for the call and the help today. I will update all parties involved in this matter.

## 2021-12-26 ENCOUNTER — Ambulatory Visit: Payer: Medicare Other | Admitting: Family Medicine

## 2021-12-26 NOTE — Progress Notes (Signed)
This encounter was created in error - please disregard. Recommend in office visit instead since coming up on 1 year since prior OV by the time of surgery.

## 2021-12-30 ENCOUNTER — Other Ambulatory Visit: Payer: Self-pay | Admitting: Family Medicine

## 2022-01-01 ENCOUNTER — Encounter: Payer: Self-pay | Admitting: Family Medicine

## 2022-01-01 ENCOUNTER — Ambulatory Visit (INDEPENDENT_AMBULATORY_CARE_PROVIDER_SITE_OTHER): Payer: Medicare Other | Admitting: Family Medicine

## 2022-01-01 VITALS — BP 142/78 | HR 80 | Temp 97.5°F | Ht 65.0 in | Wt 228.6 lb

## 2022-01-01 DIAGNOSIS — Z01818 Encounter for other preprocedural examination: Secondary | ICD-10-CM

## 2022-01-01 DIAGNOSIS — N181 Chronic kidney disease, stage 1: Secondary | ICD-10-CM

## 2022-01-01 DIAGNOSIS — E1122 Type 2 diabetes mellitus with diabetic chronic kidney disease: Secondary | ICD-10-CM

## 2022-01-01 LAB — POCT GLYCOSYLATED HEMOGLOBIN (HGB A1C): Hemoglobin A1C: 6 % — AB (ref 4.0–5.6)

## 2022-01-01 NOTE — Progress Notes (Signed)
Established Patient Office Visit  Subjective   Patient ID: Dawn Salas, female    DOB: 1940/05/20  Age: 81 y.o. MRN: [id number]  Chief Complaint  Patient presents with   Fatigue   Pre-op Exam    HPI   Dawn Salas is here for medical preoperative clearance for right total knee replacement tentatively scheduled for March 19, 2022 She has pending follow-up with cardiologist in January and also is supposed to see her endocrinologist in January as well.  Her chronic problems include history of hypertension, obstructive sleep apnea, GERD, controlled type 2 diabetes, history of hyper thyroidism with post ablative hypothyroidism, history of meningiomas of the brain, and hyperlipidemia  Her blood sugars have consistently been in the 6 range.  She had EKG last year which was unremarkable.  She had nuclear stress test 2013 which was unremarkable for any ischemia.  Denies any recent chest pains.  No chronic lung disease.  Non-smoker.  Past Medical History:  Diagnosis Date   Allergy    Anxiety    Arthritis    Asthma    patient denies    Cataract    bilateral lens implants    Colon polyps    Depression    Diabetes mellitus    Type two   Diverticulitis    Double vision 2019   left eye   Family history of adverse reaction to anesthesia    daughter- N/V    Family history of ovarian cancer    Family history of pancreatic cancer    Frequency of urination    GERD (gastroesophageal reflux disease)    History of hiatal hernia    Hyperglycemia    Hyperlipidemia    Hypertension    Hyperthyroidism    PT HAD BIPOSY -NEGATIVE.Marland Kitchen AND NOW JUST GETS CHECKED EACH YEAR .Marland Kitchen 2013 LAST TEST   SEES DR. [name].    Multiple meningiomas of spine and brain ([place])    Obstructive sleep apnea    STUDY AT [facility] DOES NOT USE C PAP   Palpitations    Peptic ulcer disease    Pneumonia    "walking pneumonia" hx of   Seizures ([place]) 2015   one siezure with brain surgery none since 2015    Sleep apnea     no CPAP   Urinary tract infection 03/2020   Past Surgical History:  Procedure Laterality Date   ABDOMINAL HYSTERECTOMY     ANTERIOR CERVICAL DECOMP/DISCECTOMY FUSION N/A 04/19/2021   Procedure: Anterior Cervical Discectomy and Fusion with Interbody Prosthesis, Plates/Screws Cervical Three-Four/Cervical Four-Five REMoval CERVICAL PLATE;  Surgeon: Newman Pies, MD;  Location: [place] [facility];  Service: Neurosurgery;  Laterality: N/A;  3C   BACK SURGERY     L SPIN 2003   NECK 2004   BRAIN MENINGIOMA EXCISION  08/2008   X 2   C5-C6 neck fusion     COLONOSCOPY  05/05/2009   pt states multiple polyps removed in both colonoscopies she has had   CRANIOTOMY  08/13/2011   Procedure: CRANIOTOMY TUMOR EXCISION;  Surgeon: [name], MD;  Location: [facility];  Service: Neurosurgery;  Laterality: Bilateral;  Bifrontal craniotomy for tumor   DILATION AND CURETTAGE OF UTERUS     YEARS AGO   L4-L5 posterior la  2021   L3-L5 fusion   left foot plantar and hammertoe     right foot bunectomy     right knee arthroscopy     x 3   right shoulder rotator cuff  ROBOTIC ASSISTED BILATERAL SALPINGO OOPHERECTOMY N/A 08/25/2015   Procedure: XI ROBOTIC ASSISTED BILATERAL SALPINGO OOPHORECTOMY;  Surgeon: [name], MD;  Location: [facility];  Service: Gynecology;  Laterality: N/A;   TONSILLECTOMY     TUBAL LIGATION     WRIST SURGERY Right     reports that she has never smoked. She has never used smokeless tobacco. She reports that she does not drink alcohol and does not use drugs. family history includes Anesthesia problems in her daughter; Arthritis in an other family member; Diabetes in her maternal grandmother and another family member; Heart disease (age of onset: 12) in her father; Hyperlipidemia in an other family member; Hypertension in an other family member; Ovarian cancer in her mother; Pancreatic cancer in her maternal aunt. Allergies  Allergen Reactions   Flagyl [Metronidazole] Other (See  Comments)    Caused increased heart rate   Aspirin Effervescent Swelling    Alka-Seltzer gel caps- sore mouth, face swollen, turned hands white   Morphine Sulfate Hives    Hallucinations   Penicillins Swelling and Rash    Has patient had a PCN reaction causing immediate rash, facial/tongue/throat swelling, SOB or lightheadedness with hypotension: Yes Has patient had a PCN reaction causing severe rash involving mucus membranes or skin necrosis: No Has patient had a PCN reaction that required hospitalization No Has patient had a PCN reaction occurring within the last 10 years: No If all of the above answers are "NO", then may proceed with Cephalosporin use.     Review of Systems  Constitutional:  Negative for malaise/fatigue.  Eyes:  Negative for blurred vision.  Respiratory:  Negative for shortness of breath.   Cardiovascular:  Negative for chest pain.  Neurological:  Negative for dizziness, weakness and headaches.      Objective:     BP (!) 142/78 (BP Location: Left Arm, Cuff Size: Large)   Pulse 80   Temp (!) 97.5 F (36.4 C) (Oral)   Ht '5\' 5"'$  (1.651 m)   Wt 228 lb 9.6 oz (103.7 kg)   SpO2 98%   BMI 38.04 kg/m  BP Readings from Last 3 Encounters:  01/01/22 (!) 142/78  10/06/21 (!) 144/78  08/04/21 128/80   Wt Readings from Last 3 Encounters:  01/01/22 228 lb 9.6 oz (103.7 kg)  11/13/21 228 lb (103.4 kg)  10/06/21 226 lb 4.8 oz (102.6 kg)      Physical Exam Vitals reviewed.  Constitutional:      Appearance: She is well-developed.  Eyes:     Pupils: Pupils are equal, round, and reactive to light.  Neck:     Thyroid: No thyromegaly.     Vascular: No JVD.  Cardiovascular:     Rate and Rhythm: Normal rate and regular rhythm.     Heart sounds:     No gallop.  Pulmonary:     Effort: Pulmonary effort is normal. No respiratory distress.     Breath sounds: Normal breath sounds. No wheezing or rales.  Musculoskeletal:     Cervical back: Neck supple.   Neurological:     Mental Status: She is alert.      No results found for any visits on 01/01/22.    The 10-year ASCVD risk score (Arnett DK, et al., 2019) is: 46.5%    Assessment & Plan:   Preoperative medical clearance for right total knee replacement.  She has pending follow-up with cardiologist in January.  She has history of type 2 diabetes which has been controlled.  No  history of known CAD.  We elected to defer EKG today since she will be seeing cardiology soon. Check A1c today.  No known medical contraindications for surgery at this time  She will be getting preoperative labs closer to the time of her surgery through orthopedist.  30 minutes were spent both in face-to-face time and non-face-to-face time reviewing her history, examining her, completing forms  Carolann Littler, MD

## 2022-01-13 ENCOUNTER — Other Ambulatory Visit: Payer: Self-pay | Admitting: Endocrinology

## 2022-01-28 ENCOUNTER — Other Ambulatory Visit: Payer: Self-pay | Admitting: Family Medicine

## 2022-01-29 ENCOUNTER — Encounter: Payer: Self-pay | Admitting: Nurse Practitioner

## 2022-01-29 ENCOUNTER — Ambulatory Visit: Payer: Medicare Other | Attending: Nurse Practitioner | Admitting: Nurse Practitioner

## 2022-01-29 VITALS — BP 134/62 | HR 78 | Ht 64.0 in | Wt 230.2 lb

## 2022-01-29 DIAGNOSIS — R0609 Other forms of dyspnea: Secondary | ICD-10-CM

## 2022-01-29 DIAGNOSIS — E039 Hypothyroidism, unspecified: Secondary | ICD-10-CM

## 2022-01-29 DIAGNOSIS — Z01810 Encounter for preprocedural cardiovascular examination: Secondary | ICD-10-CM | POA: Diagnosis not present

## 2022-01-29 DIAGNOSIS — E785 Hyperlipidemia, unspecified: Secondary | ICD-10-CM

## 2022-01-29 DIAGNOSIS — E669 Obesity, unspecified: Secondary | ICD-10-CM

## 2022-01-29 DIAGNOSIS — I10 Essential (primary) hypertension: Secondary | ICD-10-CM

## 2022-01-29 DIAGNOSIS — R600 Localized edema: Secondary | ICD-10-CM

## 2022-01-29 NOTE — Patient Instructions (Signed)
Medication Instructions:  Your physician recommends that you continue on your current medications as directed. Please refer to the Current Medication list given to you today.   Labwork: None  Testing/Procedures: None  Follow-Up: Follow up with Dr. Branch in 6 months.   Any Other Special Instructions Will Be Listed Below (If Applicable).     If you need a refill on your cardiac medications before your next appointment, please call your pharmacy.  

## 2022-01-29 NOTE — Progress Notes (Signed)
Cardiology Office Note:    Date:  01/29/2022   ID:  Dawn Salas, DOB [birth year], MRN [id number]  PCP:  Eulas Post, MD   [place] Providers Cardiologist:  Carlyle Dolly, MD     Referring MD: Eulas Post, MD   CC: Here for pre-operative cardiovascular risk assessment   History of Present Illness:    Dawn Salas is a 82 y.o. female with a hx of the following:   Hypertension Hyperlipidemia Hypothyroidism History of palpitations OSA, no CPAP Brain meningiomas, non-cancerous (follows Neurosurgery)  Past cardiovascular history includes nuclear stress test in 2013 at [facility], no ischemia noted.  Echocardiogram that same year revealed EF 60 to 65%, no significant valvular abnormality, no WMA's.  Patient is a delightful 82 year old female with past medical history as mentioned above.  She was first evaluated by Dr. Carlyle Dolly in February 2023.  She was referred by her PCP.  She was doing well the time.  She was compliant with her medications.  Denied any chest pain, some occasional shortness of breath.  Was considering right knee replacement at the time.  EKG in office showed normal sinus rhythm, no ischemic changes.  Was told to follow-up as needed.  Our office has been contacted for preoperative cardiovascular risk assessment.  Procedure will be right total knee arthroplasty.  Date of surgery is scheduled for March 19, 2022.  Surgery will be performed by Dr. Gaynelle Arabian of [facility].  Our office has been contacted regarding medical clearance.  Today she presents for follow-up evaluation.  She states she is doing well. Does note stable, DOE, attributes this to her hyperthyroidism.  Not as active as she used to be due to her right knee.  She does help take care of her husband throughout the week.  Denies any chest pain, palpitations, syncope, presyncope, dizziness, orthopnea, PND, significant weight changes, or claudication.  Does  have chronic, stable lower extremity edema, wears compression stockings.  Reports she has not had to take Lasix in a long time.  Denies any other questions or concerns today.   Past Medical History:  Diagnosis Date   Allergy    Anxiety    Arthritis    Asthma    patient denies    Cataract    bilateral lens implants    Colon polyps    Depression    Diabetes mellitus    Type two   Diverticulitis    Double vision 2019   left eye   Family history of adverse reaction to anesthesia    daughter- N/V    Family history of ovarian cancer    Family history of pancreatic cancer    Frequency of urination    GERD (gastroesophageal reflux disease)    History of hiatal hernia    Hyperglycemia    Hyperlipidemia    Hypertension    Hyperthyroidism    PT HAD BIPOSY -NEGATIVE.Marland Kitchen AND NOW JUST GETS CHECKED EACH YEAR .Marland Kitchen 2013 LAST TEST   SEES DR. [name].    Multiple meningiomas of spine and brain ([place])    Obstructive sleep apnea    STUDY AT [facility] DOES NOT USE C PAP   Palpitations    Peptic ulcer disease    Pneumonia    "walking pneumonia" hx of   Seizures ([place]) 2015   one siezure with brain surgery none since 2015    Sleep apnea    no CPAP   Urinary tract infection 03/2020  Past Surgical History:  Procedure Laterality Date   ABDOMINAL HYSTERECTOMY     ANTERIOR CERVICAL DECOMP/DISCECTOMY FUSION N/A 04/19/2021   Procedure: Anterior Cervical Discectomy and Fusion with Interbody Prosthesis, Plates/Screws Cervical Three-Four/Cervical Four-Five REMoval CERVICAL PLATE;  Surgeon: Newman Pies, MD;  Location: [place];  Service: Neurosurgery;  Laterality: N/A;  3C   BACK SURGERY     L SPIN 2003   NECK 2004   BRAIN MENINGIOMA EXCISION  08/2008   X 2   C5-C6 neck fusion     COLONOSCOPY  05/05/2009   pt states multiple polyps removed in both colonoscopies she has had   CRANIOTOMY  08/13/2011   Procedure: CRANIOTOMY TUMOR EXCISION;  Surgeon: [name], MD;  Location: [place] [facility];   Service: Neurosurgery;  Laterality: Bilateral;  Bifrontal craniotomy for tumor   DILATION AND CURETTAGE OF UTERUS     YEARS AGO   L4-L5 posterior la  2021   L3-L5 fusion   left foot plantar and hammertoe     right foot bunectomy     right knee arthroscopy     x 3   right shoulder rotator cuff     ROBOTIC ASSISTED BILATERAL SALPINGO OOPHERECTOMY N/A 08/25/2015   Procedure: XI ROBOTIC ASSISTED BILATERAL SALPINGO OOPHORECTOMY;  Surgeon: [name], MD;  Location: [facility];  Service: Gynecology;  Laterality: N/A;   TONSILLECTOMY     TUBAL LIGATION     WRIST SURGERY Right     Current Medications: Current Meds  Medication Sig   Accu-Chek Softclix Lancets lancets TEST BLOOD SUGAR TWICE DAILY.   acetaminophen (TYLENOL) 650 MG CR tablet Take 1,300 mg by mouth every 8 (eight) hours as needed for pain.   amLODipine (NORVASC) 5 MG tablet TAKE ONE (1) TABLET EACH DAY   ascorbic acid (VITAMIN C) 500 MG tablet Take 500 mg by mouth daily.   cetirizine (ZYRTEC) 10 MG tablet Take 10 mg by mouth daily as needed for allergies.    furosemide (LASIX) 40 MG tablet Take 0.5 tablets (20 mg total) by mouth daily as needed. For fluid   glucose blood (ACCU-CHEK AVIVA PLUS) test strip TEST BLOOD SUGAR TWICE DAILY.   Iron-Vitamins (GERITOL COMPLETE PO) Take 1 tablet by mouth daily.   JANUMET XR [phone] MG TB24 TAKE 1 TABLET DAILY AT LUNCH   Lancet Devices (ACCU-CHEK SOFTCLIX) lancets 1 each by Other route 2 (two) times daily. E11.9   levothyroxine (SYNTHROID) 75 MCG tablet TAKE ONE (1) TABLET EACH DAY   losartan-hydrochlorothiazide (HYZAAR) 100-12.5 MG tablet TAKE ONE (1) TABLET EACH DAY   Magnesium 250 MG TABS Take 250 mg by mouth daily.   meclizine (ANTIVERT) 25 MG tablet Take 25 mg by mouth 3 (three) times daily as needed for dizziness.    metoprolol succinate (TOPROL-XL) 50 MG 24 hr tablet TAKE ONE (1) TABLET BY MOUTH EVERY DAY   OVER THE COUNTER MEDICATION Apply 1 application. topically daily as needed  (neck pain/joint pain). Oxyrub otc pain relieving gel   potassium chloride (KLOR-CON) 10 MEQ tablet TAKE ONE (1) TABLET EACH DAY   Propylene Glycol (SYSTANE BALANCE) 0.6 % SOLN Place 1 drop into both eyes daily as needed (dry eyes).   RABEprazole (ACIPHEX) 20 MG tablet TAKE ONE (1) TABLET EACH DAY   rosuvastatin (CRESTOR) 10 MG tablet Take 10 mg by mouth daily.   solifenacin (VESICARE) 5 MG tablet TAKE ONE (1) TABLET EACH DAY   valACYclovir (VALTREX) 1000 MG tablet TAKE 2 TABS TWICE DAILY FOR 1  DAY THEN AS NEEDED (Patient taking differently: Take 1,000-2,000 mg by mouth daily as needed (cold sores).)     Allergies:   Flagyl [metronidazole], Aspirin effervescent, Morphine sulfate, and Penicillins   Social History   Socioeconomic History   Marital status: Married    Spouse name: Not on file   Number of children: 6   Years of education: Not on file   Highest education level: Not on file  Occupational History   Not on file  Tobacco Use   Smoking status: Never   Smokeless tobacco: Never  Vaping Use   Vaping Use: Never used  Substance and Sexual Activity   Alcohol use: No   Drug use: No   Sexual activity: Never  Other Topics Concern   Not on file  Social History Narrative   Not on file   Social Determinants of Health   Financial Resource Strain: Low Risk  (11/13/2021)   Overall Financial Resource Strain (CARDIA)    Difficulty of Paying Living Expenses: Not hard at all  Food Insecurity: No Food Insecurity (11/13/2021)   Hunger Vital Sign    Worried About Running Out of Food in the Last Year: Never true    [place] in the Last Year: Never true  Transportation Needs: No Transportation Needs (11/13/2021)   PRAPARE - Hydrologist (Medical): No    Lack of Transportation (Non-Medical): No  Physical Activity: Sufficiently Active (11/13/2021)   Exercise Vital Sign    Days of Exercise per Week: 3 days    Minutes of Exercise per Session: 60 min   Stress: No Stress Concern Present (11/13/2021)   [place]    Feeling of Stress : Only a little  Social Connections: Socially Integrated (09/23/2020)   Social Connection and Isolation Panel [NHANES]    Frequency of Communication with Friends and Family: Three times a week    Frequency of Social Gatherings with Friends and Family: Three times a week    Attends Religious Services: More than 4 times per year    Active Member of Clubs or Organizations: Yes    Attends Archivist Meetings: 1 to 4 times per year    Marital Status: Married     Family History: The patient's family history includes Anesthesia problems in her daughter; Arthritis in an other family member; Diabetes in her maternal grandmother and another family member; Heart disease (age of onset: 1) in her father; Hyperlipidemia in an other family member; Hypertension in an other family member; Ovarian cancer in her mother; Pancreatic cancer in her maternal aunt. There is no history of Breast cancer, Colon cancer, Colon polyps, Esophageal cancer, Stomach cancer, or Rectal cancer.  ROS:   Review of Systems  Constitutional: Negative.   HENT: Negative.    Eyes: Negative.   Respiratory:  Positive for shortness of breath. Negative for cough, hemoptysis, sputum production and wheezing.        See HPI.   Cardiovascular:  Positive for leg swelling. Negative for chest pain, palpitations, orthopnea, claudication and PND.  Gastrointestinal: Negative.   Genitourinary: Negative.   Musculoskeletal: Negative.   Skin: Negative.   Neurological: Negative.   Endo/Heme/Allergies: Negative.   Psychiatric/Behavioral: Negative.      Please see the history of present illness.    All other systems reviewed and are negative.  EKGs/Labs/Other Studies Reviewed:    The following studies were reviewed today:  EKG:  EKG is  ordered today.  The ekg ordered today demonstrates  normal sinus rhythm, 70 bpm, nonspecific ST segment changes, otherwise nothing acute.  Echocardiogram stress exercise test on June 28, 2011: Normal study.  Echocardiogram on Jun 20, 2011: Normal EF, no significant valvular abnormalities.  Recent Labs: 04/12/2021: Hemoglobin 12.4; Platelets 164 08/04/2021: BUN 19; Creatinine, Ser 1.35; Potassium 4.3; Sodium 137; TSH 2.46 10/06/2021: ALT 13  Recent Lipid Panel    Component Value Date/Time   CHOL 172 10/06/2021 0950   TRIG 71.0 10/06/2021 0950   HDL 84.30 10/06/2021 0950   CHOLHDL 2 10/06/2021 0950   VLDL 14.2 10/06/2021 0950   LDLCALC 73 10/06/2021 0950   LDLDIRECT 195.4 04/15/2012 1145     Risk Assessment/Calculations:    The 10-year ASCVD risk score (Arnett DK, et al., 2019) is: 44.2%   Values used to calculate the score:     Age: 63 years     Sex: Female     Is Non-Hispanic African American: Yes     Diabetic: Yes     Tobacco smoker: No     Systolic Blood Pressure: 585 mmHg     Is BP treated: Yes     HDL Cholesterol: 84.3 mg/dL     Total Cholesterol: 172 mg/dL       Physical Exam:    VS:  BP 134/62   Pulse 78   Ht '5\' 4"'$  (1.626 m)   Wt 230 lb 3.2 oz (104.4 kg)   SpO2 94%   BMI 39.51 kg/m     Wt Readings from Last 3 Encounters:  01/29/22 230 lb 3.2 oz (104.4 kg)  01/01/22 228 lb 9.6 oz (103.7 kg)  11/13/21 228 lb (103.4 kg)     GEN: Obese, 82 year old female in no acute distress HEENT: Normal NECK: No JVD; No carotid bruits CARDIAC: S1/S2, RRR, no murmurs, rubs, gallops; 2+ pulses along radials, equal bilaterally.  1+ PT throughout RESPIRATORY:  Clear to auscultation without rales, wheezing or rhonchi  MUSCULOSKELETAL: Nonpitting, lower extremity edema bilaterally; No deformity  SKIN: Warm and dry NEUROLOGIC:  Alert and oriented x 3 PSYCHIATRIC:  Normal affect   ASSESSMENT:    1. Preoperative cardiovascular examination   2. DOE (dyspnea on exertion)   3. Hypothyroidism, unspecified type   4.  Hypertension, unspecified type   5. Hyperlipidemia, unspecified hyperlipidemia type   6. Leg edema   7. Obesity (BMI 30-39.9)    PLAN:    In order of problems listed above:  1.  Preoperative cardiovascular risk assessment    Ms. Neal perioperative risk of a major cardiac event is 0.4% according to the Revised Cardiac Risk Index (RCRI).  Therefore, she is at low risk for perioperative complications.   Her functional capacity is fair at 5.07 METs according to the Duke Activity Status Index (DASI). Recommendations: According to ACC/AHA guidelines, no further cardiovascular testing needed.  The patient may proceed to surgery at acceptable risk.   Antiplatelet and/or Anticoagulation Recommendations: She is not on any antiplatelet or anticoagulation therapy.  Does not require SBE prophylaxis prior to dental surgery.  Will route this note to requesting party.  2.  Dyspnea on exertion, hypothyroidism Stable, chronic per her report.  Attributes this to history of hypothyroidism.  Discussed with her that of worsening symptoms, plan to update 2D echocardiogram.  Echocardiogram in 2013 normal EF, no significant valvular abnormalities.  Continue to follow-up with endocrinologist.  Heart healthy diet and regular cardiovascular exercise recommended.  3.  Hypertension BP today 134/62.  BP well-controlled at home.  Continue current medication regimen. Discussed to monitor BP at home at least 2 hours after medications and sitting for 5-10 minutes. Heart healthy diet and regular cardiovascular exercise encouraged.   4.  Hyperlipidemia Lipid panel in September 2023 unremarkable.  Continue rosuvastatin. Heart healthy diet and regular cardiovascular exercise encouraged.   5.  Leg edema Chronic, stable.  Continue Lasix as needed for leg swelling.  Continue compression stockings.  Low-salt, heart healthy diet and regular cardiovascular exercise encouraged.   6.  Obesity BMI today 39.51. Weight loss via  diet and exercise encouraged. Discussed the impact being overweight would have on cardiovascular risk.  After knee replacement surgery at next follow-up, discuss and consider PREP program referral.  7.  Disposition: Follow-up with Dr. Carlyle Dolly in 6 months or sooner if anything changes.  Medication Adjustments/Labs and Tests Ordered: Current medicines are reviewed at length with the patient today.  Concerns regarding medicines are outlined above.  Orders Placed This Encounter  Procedures   EKG 12-Lead   No orders of the defined types were placed in this encounter.   Patient Instructions  Medication Instructions:  Your physician recommends that you continue on your current medications as directed. Please refer to the Current Medication list given to you today.   Labwork: None  Testing/Procedures: None  Follow-Up: Follow up with Dr Harl Bowie in 6 months.   Any Other Special Instructions Will Be Listed Below (If Applicable).     If you need a refill on your cardiac medications before your next appointment, please call your pharmacy.    SignedFinis Bud, NP  01/29/2022 9:30 AM    [place]

## 2022-02-03 ENCOUNTER — Other Ambulatory Visit: Payer: Self-pay | Admitting: Family Medicine

## 2022-02-07 ENCOUNTER — Ambulatory Visit (INDEPENDENT_AMBULATORY_CARE_PROVIDER_SITE_OTHER): Payer: Medicare Other | Admitting: Endocrinology

## 2022-02-07 ENCOUNTER — Encounter: Payer: Self-pay | Admitting: Endocrinology

## 2022-02-07 VITALS — BP 122/64 | HR 62 | Ht 64.0 in | Wt 228.0 lb

## 2022-02-07 DIAGNOSIS — E669 Obesity, unspecified: Secondary | ICD-10-CM | POA: Diagnosis not present

## 2022-02-07 DIAGNOSIS — E1169 Type 2 diabetes mellitus with other specified complication: Secondary | ICD-10-CM

## 2022-02-07 DIAGNOSIS — E890 Postprocedural hypothyroidism: Secondary | ICD-10-CM | POA: Diagnosis not present

## 2022-02-07 LAB — BASIC METABOLIC PANEL
BUN: 20 mg/dL (ref 6–23)
CO2: 28 mEq/L (ref 19–32)
Calcium: 9 mg/dL (ref 8.4–10.5)
Chloride: 101 mEq/L (ref 96–112)
Creatinine, Ser: 1.3 mg/dL — ABNORMAL HIGH (ref 0.40–1.20)
GFR: 39.2 mL/min — ABNORMAL LOW (ref >60.00)
Glucose, Bld: 92 mg/dL (ref 70–99)
Potassium: 3.7 mEq/L (ref 3.5–5.1)
Sodium: 138 mEq/L (ref 135–145)

## 2022-02-07 LAB — T4, FREE: Free T4: 1.1 ng/dL (ref 0.60–1.60)

## 2022-02-07 LAB — TSH: TSH: 2.4 u[IU]/mL (ref 0.35–5.50)

## 2022-02-07 MED ORDER — JANUMET XR 50-500 MG PO TB24: ORAL_TABLET | ORAL | 5 refills | Status: DC

## 2022-02-07 NOTE — Progress Notes (Signed)
Please call to let patient know that the lab results are normal and no further action needed

## 2022-02-07 NOTE — Progress Notes (Signed)
Patient ID: Dawn Salas, female   DOB: 26-Jul-1940, 82 y.o.   MRN: [id number]                                                                                                                  Chief complaint: Endocrinology follow-up   History of Present Illness:   History obtained on initial consultation:  She was diagnosed as having Hyperthyroidism in 2015 when she was having palpitations and was seen to have a goiter. At that time she was having the usual symptoms of fatigue, shakiness, palpitations, heat intolerance and some weight loss No details of this are available She was treated with I-131, unknown dose which was prescribed in March 2015 Apparently she had a large hot nodule on the left, however details of her treatment are not available  Apparently subsequently her hyperthyroidism recurred and methimazole restarted ?  In 4/17 Also the dose was increased up to 7.5 mg in 10/2015  Apparently prior to her being treated for the hot nodule she had a multinodular goiter evaluated with a fine-needle aspiration in 09/2010  Since she was significantly hyperthyroid on her initial exam in February and free T3 was 5.0 and was treated with methimazole Because of persistent hyperthyroidism and very high thyrotropin receptor antibody she was given I-131 treatment with 20.3 mCi on 06/04/16  RECENT history:  She has been HYPOTHYROID as of 07/2016 with a low free T4 level  She was also having some fatigue and cold sensitivity as well as the muscle cramps With starting levothyroxine 125 g she felt better, however subsequently TSH was low again the dose was reduced progressively  She is taking 75g of levothyroxine, 7 1/2 tablets weekly since 01/2021 No change in her mild fatigue symptoms  She has taken her levothyroxine before breakfast daily and has not missed any doses   She takes her Geritol with iron after breakfast half an hour later  Wt Readings from Last 3 Encounters:  02/07/22  228 lb (103.4 kg)  01/29/22 230 lb 3.2 oz (104.4 kg)  01/01/22 228 lb 9.6 oz (103.7 kg)     Thyroid function tests as follows:     Lab Results  Component Value Date   TSH 2.40 02/07/2022   TSH 2.46 08/04/2021   TSH 2.94 03/31/2021   FREET4 1.10 02/07/2022   FREET4 1.04 08/04/2021   FREET4 1.32 03/31/2021   Graves' ophthalmopathy:  History of diplopia treated by ophthalmologist with wearing prism lenses Has some tearing of the eyes and has been given drops by the ophthalmologist She is followed by ophthalmologist regularly Previous MRI had shown mild thickening of the rectus muscle on the left in 2018  DIABETES: See review of systems    Allergies as of 02/07/2022       Reactions   Flagyl [metronidazole] Other (See Comments)   Caused increased heart rate   Aspirin Effervescent Swelling   Alka-Seltzer gel caps- sore mouth, face swollen, turned hands white   Morphine Sulfate Hives   Hallucinations  Penicillins Swelling, Rash   Has patient had a PCN reaction causing immediate rash, facial/tongue/throat swelling, SOB or lightheadedness with hypotension: Yes Has patient had a PCN reaction causing severe rash involving mucus membranes or skin necrosis: No Has patient had a PCN reaction that required hospitalization No Has patient had a PCN reaction occurring within the last 10 years: No If all of the above answers are "NO", then may proceed with Cephalosporin use.        Medication List        Accurate as of February 07, 2022 11:59 PM. If you have any questions, ask your nurse or doctor.          STOP taking these medications    Janumet XR 830 [id number] MG Tb24 Generic drug: SitaGLIPtin-MetFORMIN HCl Replaced by: Janumet XR 50-500 MG Tb24 Stopped by: Elayne Snare, MD       TAKE these medications    Accu-Chek Aviva Plus test strip Generic drug: glucose blood TEST BLOOD SUGAR TWICE DAILY.   accu-chek softclix lancets 1 each by Other route 2 (two) times daily.  E11.9   Accu-Chek Softclix Lancets lancets TEST BLOOD SUGAR TWICE DAILY.   acetaminophen 650 MG CR tablet Commonly known as: TYLENOL Take 1,300 mg by mouth every 8 (eight) hours as needed for pain.   amLODipine 5 MG tablet Commonly known as: NORVASC TAKE ONE (1) TABLET EACH DAY   ascorbic acid 500 MG tablet Commonly known as: VITAMIN C Take 500 mg by mouth daily.   cetirizine 10 MG tablet Commonly known as: ZYRTEC Take 10 mg by mouth daily as needed for allergies.   furosemide 40 MG tablet Commonly known as: LASIX Take 0.5 tablets (20 mg total) by mouth daily as needed. For fluid   GERITOL COMPLETE PO Take 1 tablet by mouth daily.   Janumet XR 50-500 MG Tb24 Generic drug: SitaGLIPtin-MetFORMIN HCl 1 Tab daily Replaces: Janumet XR [phone] MG Tb24 Started by: Elayne Snare, MD   levothyroxine 75 MCG tablet Commonly known as: SYNTHROID TAKE ONE (1) TABLET EACH DAY   losartan-hydrochlorothiazide 100-12.5 MG tablet Commonly known as: HYZAAR TAKE ONE (1) TABLET EACH DAY   Magnesium 250 MG Tabs Take 250 mg by mouth daily.   meclizine 25 MG tablet Commonly known as: ANTIVERT Take 25 mg by mouth 3 (three) times daily as needed for dizziness.   metoprolol succinate 50 MG 24 hr tablet Commonly known as: TOPROL-XL TAKE ONE (1) TABLET BY MOUTH EVERY DAY   OVER THE COUNTER MEDICATION Apply 1 application. topically daily as needed (neck pain/joint pain). Oxyrub otc pain relieving gel   potassium chloride 10 MEQ tablet Commonly known as: KLOR-CON TAKE ONE (1) TABLET EACH DAY   RABEprazole 20 MG tablet Commonly known as: ACIPHEX TAKE ONE (1) TABLET EACH DAY   rosuvastatin 10 MG tablet Commonly known as: CRESTOR Take 10 mg by mouth daily.   solifenacin 5 MG tablet Commonly known as: VESICARE TAKE ONE (1) TABLET EACH DAY   Systane Balance 0.6 % Soln Generic drug: Propylene Glycol Place 1 drop into both eyes daily as needed (dry eyes).   valACYclovir 1000 MG  tablet Commonly known as: VALTREX TAKE 2 TABS TWICE DAILY FOR 1 DAY THEN AS NEEDED What changed: See the new instructions.            Past Medical History:  Diagnosis Date   Allergy    Anxiety    Arthritis    Asthma    patient denies  Cataract    bilateral lens implants    Colon polyps    Depression    Diabetes mellitus    Type two   Diverticulitis    Double vision 2019   left eye   Family history of adverse reaction to anesthesia    daughter- N/V    Family history of ovarian cancer    Family history of pancreatic cancer    Frequency of urination    GERD (gastroesophageal reflux disease)    History of hiatal hernia    Hyperglycemia    Hyperlipidemia    Hypertension    Hyperthyroidism    PT HAD BIPOSY -NEGATIVE.Marland Kitchen AND NOW JUST GETS CHECKED EACH YEAR .[facility] Kitchen 2013 LAST TEST   SEES DR. [name].    Multiple meningiomas of spine and brain ([place])    Obstructive sleep apnea    STUDY AT [facility] DOES NOT USE C PAP   Palpitations    Peptic ulcer disease    Pneumonia    "walking pneumonia" hx of   Seizures ([place]) 2015   one siezure with brain surgery none since 2015    Sleep apnea    no CPAP   Urinary tract infection 03/2020    Past Surgical History:  Procedure Laterality Date   ABDOMINAL HYSTERECTOMY     ANTERIOR CERVICAL DECOMP/DISCECTOMY FUSION N/A 04/19/2021   Procedure: Anterior Cervical Discectomy and Fusion with Interbody Prosthesis, Plates/Screws Cervical Three-Four/Cervical Four-Five REMoval CERVICAL PLATE;  Surgeon: Newman Pies, MD;  Location: [facility];  Service: Neurosurgery;  Laterality: N/A;  3C   BACK SURGERY     L SPIN 2003   NECK 2004   BRAIN MENINGIOMA EXCISION  08/2008   X 2   C5-C6 neck fusion     COLONOSCOPY  05/05/2009   pt states multiple polyps removed in both colonoscopies she has had   CRANIOTOMY  08/13/2011   Procedure: CRANIOTOMY TUMOR EXCISION;  Surgeon: [name], MD;  Location: [facility];  Service: Neurosurgery;  Laterality:  Bilateral;  Bifrontal craniotomy for tumor   DILATION AND CURETTAGE OF UTERUS     YEARS AGO   L4-L5 posterior la  2021   L3-L5 fusion   left foot plantar and hammertoe     right foot bunectomy     right knee arthroscopy     x 3   right shoulder rotator cuff     ROBOTIC ASSISTED BILATERAL SALPINGO OOPHERECTOMY N/A 08/25/2015   Procedure: XI ROBOTIC ASSISTED BILATERAL SALPINGO OOPHORECTOMY;  Surgeon: [name], MD;  Location: [facility];  Service: Gynecology;  Laterality: N/A;   TONSILLECTOMY     TUBAL LIGATION     WRIST SURGERY Right     Family History  Problem Relation Age of Onset   Ovarian cancer Mother    Heart disease Father 38   Pancreatic cancer Maternal Aunt    Diabetes Maternal Grandmother    Anesthesia problems Daughter        NAUSEA AND VOMITING POST OP   Arthritis Other    Hyperlipidemia Other    Hypertension Other    Diabetes Other    Breast cancer Neg Hx    Colon cancer Neg Hx    Colon polyps Neg Hx    Esophageal cancer Neg Hx    Stomach cancer Neg Hx    Rectal cancer Neg Hx     Social History:  reports that she has never smoked. She has never used smokeless tobacco. She reports that she does not drink  alcohol and does not use drugs.  Allergies:  Allergies  Allergen Reactions   Flagyl [Metronidazole] Other (See Comments)    Caused increased heart rate   Aspirin Effervescent Swelling    Alka-Seltzer gel caps- sore mouth, face swollen, turned hands white   Morphine Sulfate Hives    Hallucinations   Penicillins Swelling and Rash    Has patient had a PCN reaction causing immediate rash, facial/tongue/throat swelling, SOB or lightheadedness with hypotension: Yes Has patient had a PCN reaction causing severe rash involving mucus membranes or skin necrosis: No Has patient had a PCN reaction that required hospitalization No Has patient had a PCN reaction occurring within the last 10 years: No If all of the above answers are "NO", then may proceed with  Cephalosporin use.       Review of Systems    DIABETES   She has had longstanding diabetes, with mild obesity  Previously with relatively higher A1c of 7% and increasing blood sugars she was switched from metformin to Janumet in 6/21  She was also feeling shaky sometimes when taking metformin in the morning  She takes JANUMET around noontime  She has good control with A1c 6 compared to 6.5  Usually tries to eat healthy meals  Has still been eating cereal and sometimes will eat during the night when she is hungry Weight is the same Currently not exercising She has mostly checked fasting blood sugars as below and some in the late afternoon She thinks her blood sugars may sometimes be low at different times including morning and she will have symptoms when blood sugars are even in the 80s   Blood sugars from review of the monitor:  Recent readings as low as 69, her meter has the wrong date programmed and difficult to analyze, highest reading under 140  Wt Readings from Last 3 Encounters:  02/07/22 228 lb (103.4 kg)  01/29/22 230 lb 3.2 oz (104.4 kg)  01/01/22 228 lb 9.6 oz (103.7 kg)    Lab Results  Component Value Date   HGBA1C 6.0 (A) 01/01/2022   HGBA1C 6.5 (A) 08/04/2021   HGBA1C 6.5 (H) 04/12/2021   Lab Results  Component Value Date   MICROALBUR 1.2 02/02/2021   LDLCALC 73 10/06/2021   CREATININE 1.30 (H) 02/07/2022   HYPERTENSION: Followed by PCP with usually good control    BP Readings from Last 3 Encounters:  02/07/22 122/64  01/29/22 134/62  01/01/22 (!) 142/78   LIPIDS: She is taking 40 mg simvastatin from her PCP    Examination:   BP 122/64   Pulse 62   Ht '5\' 4"'$  (1.626 m)   Wt 228 lb (103.4 kg)   BMI 39.14 kg/m   Diabetic Foot Exam - Simple   Simple Foot Form Diabetic Foot exam was performed with the following findings: Yes   Visual Inspection No deformities, no ulcerations, no other skin breakdown bilaterally: Yes Sensation  Testing Intact to touch and monofilament testing bilaterally: Yes Pulse Check See comments: Yes Comments Pedal pulses not palpable       Assessment/Plan:  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-131  Her symptoms are difficult to correlate with her thyroid levels as she continues to have some fatigue as before She has taken her levothyroxine as directed , taking 7-1/2 tablets a week of the 75 mcg  Thyroid labs pending  DIABETES with severe obesity: She has hypertension, diabetes, dyslipidemia  Her blood sugars are controlled with Janumet  A1c is 6  Not clear why she reporting hypoglycemic symptoms when blood sugars are in the low normal range  She is A1c is lower than before we will reduce her Janumet to 50/500 Unlikely she can afford the brand-name medication However otherwise may be a good candidate for an SGLT2 drug Her meter will be sent to the right date To alternate fasting and after meal readings and let us know if they are consistently high Discussed avoiding cereal especially as a snack as she may be getting some reactive hypoglycemia with this  Follow-up in 6 months      Patient Instructions  Avoid cereal   Elayne Snare 02/08/2022, 9:24 AM

## 2022-02-07 NOTE — Patient Instructions (Signed)
Avoid cereal

## 2022-02-12 LAB — HM DIABETES EYE EXAM

## 2022-02-13 ENCOUNTER — Encounter: Payer: Self-pay | Admitting: Endocrinology

## 2022-02-24 ENCOUNTER — Other Ambulatory Visit: Payer: Self-pay | Admitting: Family Medicine

## 2022-02-26 NOTE — H&P (Signed)
TOTAL KNEE ADMISSION H&P  Patient is being admitted for right total knee arthroplasty.  Subjective:  Chief Complaint: Right knee pain.  HPI: Dawn Salas, 82 y.o. female has a history of pain and functional disability in the right knee due to arthritis and has failed non-surgical conservative treatments for greater than 12 weeks to include NSAID's and/or analgesics, corticosteriod injections, viscosupplementation injections, use of assistive devices, and activity modification. Onset of symptoms was gradual, starting several years ago with gradually worsening course since that time. The patient noted no past surgery on the right knee.  Patient currently rates pain in the right knee at 7 out of 10 with activity. Patient has night pain, worsening of pain with activity and weight bearing, and pain that interferes with activities of daily living. Patient has evidence of  tricompartmental bone-on-bone change  by imaging studies. There is no active infection.  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   Spondylolisthesis of lumbar region 12/23/2013   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   Hyperthyroidism 05/25/2013   Obesity (BMI 30-39.9) 12/30/2012   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[place]) 08/21/2011   Meningioma ([place]) 08/13/2011   DIZZINESS 11/02/2009   Type 2 diabetes mellitus, controlled ([place]) 05/20/2009   HYPOKALEMIA 05/02/2009   DEPRESSION 06/24/2008   GERD 06/24/2008   PEPTIC  ULCER DISEASE 06/24/2008   TMJ SYNDROME 06/09/2008   Hyperlipemia 11/08/2006   OBSTRUCTIVE SLEEP APNEA 11/08/2006   Essential hypertension 11/08/2006   ALLERGIC RHINITIS 11/08/2006   VOCAL CORD DISORDER 11/08/2006   ASTHMA 11/08/2006   TACHYARRHYTHMIA 11/08/2006    Past Medical History:  Diagnosis Date   Allergy    Anxiety    Arthritis    Asthma    patient denies    Cataract    bilateral lens implants    Colon polyps    Depression    Diabetes mellitus    Type two   Diverticulitis    Double vision 2019   left eye   Family history of adverse reaction to anesthesia    daughter- N/V    Family history of ovarian cancer    Family history of pancreatic cancer    Frequency of urination    GERD (gastroesophageal reflux disease)    History of hiatal hernia    Hyperglycemia    Hyperlipidemia    Hypertension    Hyperthyroidism    PT HAD BIPOSY -NEGATIVE.Marland Kitchen AND NOW JUST GETS CHECKED EACH YEAR .Marland Kitchen 2013 LAST TEST   SEES DR. [name].    Multiple meningiomas of spine and brain ([place])    Obstructive sleep apnea    STUDY AT [facility] DOES NOT USE C PAP   Palpitations    Peptic ulcer disease    Pneumonia    "walking pneumonia" hx of   Seizures ([place]) 2015   one siezure with brain surgery none since 2015    Sleep apnea    no CPAP   Urinary tract infection 03/2020    Past Surgical History:  Procedure Laterality Date   ABDOMINAL HYSTERECTOMY  ANTERIOR CERVICAL DECOMP/DISCECTOMY FUSION N/A 04/19/2021   Procedure: Anterior Cervical Discectomy and Fusion with Interbody Prosthesis, Plates/Screws Cervical Three-Four/Cervical Four-Five REMoval CERVICAL PLATE;  Surgeon: Newman Pies, MD;  Location: [facility];  Service: Neurosurgery;  Laterality: N/A;  3C   BACK SURGERY     L SPIN 2003   NECK 2004   BRAIN MENINGIOMA EXCISION  08/2008   X 2   C5-C6 neck fusion     COLONOSCOPY  05/05/2009   pt states multiple polyps removed in both colonoscopies she has had   CRANIOTOMY  08/13/2011    Procedure: CRANIOTOMY TUMOR EXCISION;  Surgeon: [name], MD;  Location: [facility];  Service: Neurosurgery;  Laterality: Bilateral;  Bifrontal craniotomy for tumor   DILATION AND CURETTAGE OF UTERUS     YEARS AGO   L4-L5 posterior la  2021   L3-L5 fusion   left foot plantar and hammertoe     right foot bunectomy     right knee arthroscopy     x 3   right shoulder rotator cuff     ROBOTIC ASSISTED BILATERAL SALPINGO OOPHERECTOMY N/A 08/25/2015   Procedure: XI ROBOTIC ASSISTED BILATERAL SALPINGO OOPHORECTOMY;  Surgeon: [name], MD;  Location: [facility];  Service: Gynecology;  Laterality: N/A;   TONSILLECTOMY     TUBAL LIGATION     WRIST SURGERY Right     Prior to Admission medications   Medication Sig Start Date End Date Taking? Authorizing Provider  Accu-Chek Softclix Lancets lancets TEST BLOOD SUGAR TWICE DAILY. 08/29/21   Burchette, Alinda Sierras, MD  acetaminophen (TYLENOL) 650 MG CR tablet Take 1,300 mg by mouth every 8 (eight) hours as needed for pain.    [provider]  amLODipine (NORVASC) 5 MG tablet TAKE ONE (1) TABLET EACH DAY 02/05/22   Burchette, Alinda Sierras, MD  ascorbic acid (VITAMIN C) 500 MG tablet Take 500 mg by mouth daily.    [provider]  cetirizine (ZYRTEC) 10 MG tablet Take 10 mg by mouth daily as needed for allergies.     [provider]  furosemide (LASIX) 40 MG tablet Take 0.5 tablets (20 mg total) by mouth daily as needed. For fluid 09/24/14   Burchette, Alinda Sierras, MD  glucose blood (ACCU-CHEK AVIVA PLUS) test strip TEST BLOOD SUGAR TWICE DAILY. 04/24/21   Burchette, Alinda Sierras, MD  Iron-Vitamins (GERITOL COMPLETE PO) Take 1 tablet by mouth daily.    [provider]  Lancet Devices [facility]) lancets 1 each by Other route 2 (two) times daily. E11.9 03/31/15   Burchette, Alinda Sierras, MD  levothyroxine (SYNTHROID) 75 MCG tablet TAKE ONE (1) TABLET EACH DAY 01/17/22   Elayne Snare, MD  losartan-hydrochlorothiazide [facility])  100-12.5 MG tablet TAKE ONE (1) TABLET EACH DAY 10/03/21   Burchette, Alinda Sierras, MD  Magnesium 250 MG TABS Take 250 mg by mouth daily.    [provider]  meclizine (ANTIVERT) 25 MG tablet Take 25 mg by mouth 3 (three) times daily as needed for dizziness.     [provider]  metoprolol succinate (TOPROL-XL) 50 MG 24 hr tablet TAKE ONE (1) TABLET BY MOUTH EVERY DAY 10/24/21   Burchette, Alinda Sierras, MD  OVER THE COUNTER MEDICATION Apply 1 application. topically daily as needed (neck pain/joint pain). Oxyrub otc pain relieving gel    [provider]  potassium chloride (KLOR-CON) 10 MEQ tablet TAKE ONE (1) TABLET EACH DAY 01/01/22   Burchette, Alinda Sierras, MD  Propylene Glycol (SYSTANE BALANCE)  0.6 % SOLN Place 1 drop into both eyes daily as needed (dry eyes).    [provider]  RABEprazole (ACIPHEX) 20 MG tablet TAKE ONE (1) TABLET EACH DAY 01/29/22   Burchette, Alinda Sierras, MD  rosuvastatin (CRESTOR) 10 MG tablet Take 10 mg by mouth daily.    [provider]  SitaGLIPtin-MetFORMIN HCl (JANUMET XR) 50-500 MG TB24 1 Tab daily 02/07/22   Elayne Snare, MD  solifenacin (VESICARE) 5 MG tablet TAKE ONE (1) TABLET EACH DAY 02/03/18   Burchette, Alinda Sierras, MD  valACYclovir (VALTREX) 1000 MG tablet TAKE 2 TABS TWICE DAILY FOR 1 DAY THEN AS NEEDED Patient taking differently: Take 1,000-2,000 mg by mouth daily as needed (cold sores). 04/11/20   Burchette, Alinda Sierras, MD  venlafaxine (EFFEXOR XR) 37.5 MG 24 hr capsule Take 1 capsule (37.5 mg total) by mouth daily. 02/15/11 04/11/11  Burchette, Alinda Sierras, MD    Allergies  Allergen Reactions   Flagyl [Metronidazole] Other (See Comments)    Caused increased heart rate   Aspirin Effervescent Swelling    Alka-Seltzer gel caps- sore mouth, face swollen, turned hands white   Morphine Sulfate Hives    Hallucinations   Penicillins Swelling and Rash    Has patient had a PCN reaction causing immediate rash, facial/tongue/throat swelling, SOB or  lightheadedness with hypotension: Yes Has patient had a PCN reaction causing severe rash involving mucus membranes or skin necrosis: No Has patient had a PCN reaction that required hospitalization No Has patient had a PCN reaction occurring within the last 10 years: No If all of the above answers are "NO", then may proceed with Cephalosporin use.     Social History   Socioeconomic History   Marital status: Married    Spouse name: Not on file   Number of children: 6   Years of education: Not on file   Highest education level: Not on file  Occupational History   Not on file  Tobacco Use   Smoking status: Never   Smokeless tobacco: Never  Vaping Use   Vaping Use: Never used  Substance and Sexual Activity   Alcohol use: No   Drug use: No   Sexual activity: Never  Other Topics Concern   Not on file  Social History Narrative   Not on file   Social Determinants of Health   Financial Resource Strain: Low Risk  (11/13/2021)   Overall Financial Resource Strain (CARDIA)    Difficulty of Paying Living Expenses: Not hard at all  Food Insecurity: No Food Insecurity (11/13/2021)   Hunger Vital Sign    Worried About Running Out of Food in the Last Year: Never true    [place] in the Last Year: Never true  Transportation Needs: No Transportation Needs (11/13/2021)   PRAPARE - Hydrologist (Medical): No    Lack of Transportation (Non-Medical): No  Physical Activity: Sufficiently Active (11/13/2021)   Exercise Vital Sign    Days of Exercise per Week: 3 days    Minutes of Exercise per Session: 60 min  Stress: No Stress Concern Present (11/13/2021)   [place]    Feeling of Stress : Only a little  Social Connections: Socially Integrated (09/23/2020)   Social Connection and Isolation Panel [NHANES]    Frequency of Communication with Friends and Family: Three times a week    Frequency  of Social Gatherings with Friends and Family: Three times a  week    Attends Religious Services: More than 4 times per year    Active Member of Clubs or Organizations: Yes    Attends Archivist Meetings: 1 to 4 times per year    Marital Status: Married  Human resources officer Violence: Not At Risk (09/23/2020)   Humiliation, Afraid, Rape, and Kick questionnaire    Fear of Current or Ex-Partner: No    Emotionally Abused: No    Physically Abused: No    Sexually Abused: No    Tobacco Use: Low Risk  (02/07/2022)   Patient History    Smoking Tobacco Use: Never    Smokeless Tobacco Use: Never    Passive Exposure: Not on file   Social History   Substance and Sexual Activity  Alcohol Use No    Family History  Problem Relation Age of Onset   Ovarian cancer Mother    Heart disease Father 60   Pancreatic cancer Maternal Aunt    Diabetes Maternal Grandmother    Anesthesia problems Daughter        NAUSEA AND VOMITING POST OP   Arthritis Other    Hyperlipidemia Other    Hypertension Other    Diabetes Other    Breast cancer Neg Hx    Colon cancer Neg Hx    Colon polyps Neg Hx    Esophageal cancer Neg Hx    Stomach cancer Neg Hx    Rectal cancer Neg Hx     Review of Systems  Constitutional:  Negative for chills and fever.  HENT: Negative.    Eyes: Negative.   Respiratory:  Negative for cough and shortness of breath.   Cardiovascular:  Negative for chest pain and palpitations.  Gastrointestinal:  Negative for abdominal pain, constipation, diarrhea, nausea and vomiting.  Genitourinary:  Negative for dysuria, frequency and urgency.  Musculoskeletal:  Positive for joint pain.  Skin:  Negative for rash.   Objective:  Physical Exam: Well nourished and well developed.  General: Alert and oriented x3, cooperative and pleasant, no acute distress.  Head: normocephalic, atraumatic, neck supple.  Eyes: EOMI.  Abdomen: non-tender to palpation and soft, normoactive bowel  sounds. Musculoskeletal: The patient is ambulating with a cane.   Right Hip Exam:   The range of motion: normal without discomfort.   Right Knee Exam:   No effusion present. No swelling present.   The range of motion is: 20 to 120 degrees.   Moderate crepitus on range of motion of the knee.   Positive medial joint line tenderness.   Positive lateral joint line tenderness.   The knee is stable.  Calves soft and nontender. Motor function intact in LE. Strength 5/5 LE bilaterally. Neuro: Distal pulses 2+. Sensation to light touch intact in LE.  Vital signs in last 24 hours: BP: ()/()  Arterial Line BP: ()/()   Imaging Review Plain radiographs demonstrate severe degenerative joint disease of the right knee. The overall alignment is neutral. The bone quality appears to be adequate for age and reported activity level.  Assessment/Plan:  End stage arthritis, right knee   The patient history, physical examination, clinical judgment of the provider and imaging studies are consistent with end stage degenerative joint disease of the right knee and total knee arthroplasty is deemed medically necessary. The treatment options including medical management, injection therapy arthroscopy and arthroplasty were discussed at length. The risks and benefits of total knee arthroplasty were presented and reviewed. The risks due to aseptic loosening, infection, stiffness, patella tracking problems,  thromboembolic complications and other imponderables were discussed. The patient acknowledged the explanation, agreed to proceed with the plan and consent was signed. Patient is being admitted for inpatient treatment for surgery, pain control, PT, OT, prophylactic antibiotics, VTE prophylaxis, progressive ambulation and ADLs and discharge planning. The patient is planning to be discharged  home .   Patient's anticipated LOS is less than 2 midnights, meeting these requirements: - Lives within 1 hour of care - Has  a competent adult at home to recover with post-op - NO history of  - Chronic pain requiring opioids  - Coronary Artery Disease  - Heart failure  - Heart attack  - Stroke  - DVT/VTE  - Cardiac arrhythmia  - Respiratory Failure/COPD  - Renal failure  - Anemia  - Advanced Liver disease  Therapy Plans: [place] Disposition: Home with Daughter Planned DVT Prophylaxis: Xarelto 10 mg (intolerant to aspirin) DME Needed: None PCP: Carolann Littler, MD (requesting cardiac clearance) Cardiologist: Finis Bud, NP (clearance received) TXA: IV Allergies: aspirin (GI bleed), flagyl, morphine (hallucinations), penicillins (hives) Anesthesia Concerns: hx of lumbar fusions BMI: 37.2 Last HgbA1c: 6.0%  Pharmacy: [facility] [place], [place])  - Patient was instructed on what medications to stop prior to surgery. - Follow-up visit in 2 weeks with Dr. Wynelle Link - Begin physical therapy following surgery - Pre-operative lab work as pre-surgical testing - Prescriptions will be provided in hospital at time of discharge  R. Jaynie Bream, PA-C Orthopedic Surgery [facility] [place]

## 2022-03-05 NOTE — Patient Instructions (Signed)
SURGICAL WAITING ROOM VISITATION  Patients having surgery or a procedure may have no more than 2 support people in the waiting area - these visitors may rotate.    Children under the age of 75 must have an adult with them who is not the patient.  Due to an increase in RSV and influenza rates and associated hospitalizations, children ages 39 and under may not visit patients in [place].  If the patient needs to stay at the hospital during part of their recovery, the visitor guidelines for inpatient rooms apply. Pre-op nurse will coordinate an appropriate time for 1 support person to accompany patient in pre-op.  This support person may not rotate.    Please refer to the [facility] website for the visitor guidelines for Inpatients (after your surgery is over and you are in a regular room).    Your procedure is scheduled on: 03/19/22   Report to [facility] Main Entrance    Report to admitting at 5:15 AM   Call this number if you have problems the morning of surgery [phone]   Do not eat food :After Midnight.   After Midnight you may have the following liquids until 4:15 AM DAY OF SURGERY  Water Non-Citrus Juices (without pulp, NO RED-Apple, White grape, White cranberry) Black Coffee (NO MILK/CREAM OR CREAMERS, sugar ok)  Clear Tea (NO MILK/CREAM OR CREAMERS, sugar ok) regular and decaf                             Plain Jell-O (NO RED)                                           Fruit ices (not with fruit pulp, NO RED)                                     Popsicles (NO RED)                                                               Sports drinks like Gatorade (NO RED)    The day of surgery:  Drink ONE (1) Pre-Surgery G2 at 4:15 AM the morning of surgery. Drink in one sitting. Do not sip.  This drink was given to you during your hospital  pre-op appointment visit. Nothing else to drink after completing the  Pre-Surgery G2.          If you have questions,  please contact your surgeon's office.   FOLLOW BOWEL PREP AND ANY ADDITIONAL PRE OP INSTRUCTIONS YOU RECEIVED FROM YOUR SURGEON'S OFFICE!!!     Oral Hygiene is also important to reduce your risk of infection.                                    Remember - BRUSH YOUR TEETH THE MORNING OF SURGERY WITH YOUR REGULAR TOOTHPASTE  DENTURES WILL BE REMOVED PRIOR TO SURGERY PLEASE DO NOT APPLY "Poly grip" OR ADHESIVES!!!  Take these medicines the morning of surgery with A SIP OF WATER: Tylenol, Amlodipine, Zyrtec, Levothyroxine, Meclizine, Metoprolol, Rabeprazole, Rosuvastatin   How to Manage Your Diabetes Before and After Surgery  Why is it important to control my blood sugar before and after surgery? Improving blood sugar levels before and after surgery helps healing and can limit problems. A way of improving blood sugar control is eating a healthy diet by:  Eating less sugar and carbohydrates  Increasing activity/exercise  Talking with your doctor about reaching your blood sugar goals High blood sugars (greater than 180 mg/dL) can raise your risk of infections and slow your recovery, so you will need to focus on controlling your diabetes during the weeks before surgery. Make sure that the doctor who takes care of your diabetes knows about your planned surgery including the date and location.  How do I manage my blood sugar before surgery? Check your blood sugar at least 4 times a day, starting 2 days before surgery, to make sure that the level is not too high or low. Check your blood sugar the morning of your surgery when you wake up and every 2 hours until you get to the Short Stay unit. If your blood sugar is less than 70 mg/dL, you will need to treat for low blood sugar: Do not take insulin. Treat a low blood sugar (less than 70 mg/dL) with  cup of clear juice (cranberry or apple), 4 glucose tablets, OR glucose gel. Recheck blood sugar in 15 minutes after treatment (to make sure it is  greater than 70 mg/dL). If your blood sugar is not greater than 70 mg/dL on recheck, call [phone] for further instructions. Report your blood sugar to the short stay nurse when you get to Short Stay.  If you are admitted to the hospital after surgery: Your blood sugar will be checked by the staff and you will probably be given insulin after surgery (instead of oral diabetes medicines) to make sure you have good blood sugar levels. The goal for blood sugar control after surgery is 80-180 mg/dL.   WHAT DO I DO ABOUT MY DIABETES MEDICATION?  Do not take oral diabetes medicines (pills) the morning of surgery.  THE DAY BEFORE SURGERY, take Janumet as prescribed.      THE MORNING OF SURGERY, do not take Janumet.  Reviewed and Endorsed by [facility] Patient Education Committee, August 2015                              You may not have any metal on your body including hair pins, jewelry, and body piercing             Do not wear make-up, lotions, powders, perfumes, or deodorant  Do not wear nail polish including gel and S&S, artificial/acrylic nails, or any other type of covering on natural nails including finger and toenails. If you have artificial nails, gel coating, etc. that needs to be removed by a nail salon please have this removed prior to surgery or surgery may need to be canceled/ delayed if the surgeon/ anesthesia feels like they are unable to be safely monitored.   Do not shave  48 hours prior to surgery.    Do not bring valuables to the hospital. [place].   Contacts, glasses, dentures or bridgework may not be  worn into surgery.   Bring small overnight bag day of surgery.   DO NOT [place]. PHARMACY WILL DISPENSE MEDICATIONS LISTED ON YOUR MEDICATION LIST TO YOU DURING YOUR ADMISSION [place]!   Special Instructions: Bring a copy of your healthcare power of attorney and living  will documents the day of surgery if you haven't scanned them before.              Please read over the following fact sheets you were given: IF [place] [phone] Schneiders    If you received a COVID test during your pre-op visit  it is requested that you wear a mask when out in public, stay away from anyone that may not be feeling well and notify your surgeon if you develop symptoms. If you test positive for Covid or have been in contact with anyone that has tested positive in the last 10 days please notify you surgeon.    [place] - Preparing for Surgery Before surgery, you can play an important role.  Because skin is not sterile, your skin needs to be as free of germs as possible.  You can reduce the number of germs on your skin by washing with CHG (chlorahexidine gluconate) soap before surgery.  CHG is an antiseptic cleaner which kills germs and bonds with the skin to continue killing germs even after washing. Please DO NOT use if you have an allergy to CHG or antibacterial soaps.  If your skin becomes reddened/irritated stop using the CHG and inform your nurse when you arrive at Short Stay. Do not shave (including legs and underarms) for at least 48 hours prior to the first CHG shower.  You may shave your face/neck.  Please follow these instructions carefully:  1.  Shower with CHG Soap the night before surgery and the  morning of surgery.  2.  If you choose to wash your hair, wash your hair first as usual with your normal  shampoo.  3.  After you shampoo, rinse your hair and body thoroughly to remove the shampoo.                             4.  Use CHG as you would any other liquid soap.  You can apply chg directly to the skin and wash.  Gently with a scrungie or clean washcloth.  5.  Apply the CHG Soap to your body ONLY FROM THE NECK DOWN.   Do   not use on face/ open                           Wound or open sores. Avoid contact with  eyes, ears mouth and   genitals (private parts).                       Wash face,  Genitals (private parts) with your normal soap.             6.  Wash thoroughly, paying special attention to the area where your    surgery  will be performed.  7.  Thoroughly rinse your body with warm water from the neck down.  8.  DO NOT shower/wash with your normal soap after using and rinsing off the CHG Soap.  9.  Pat yourself dry with a clean towel.            10.  Wear clean pajamas.            11.  Place clean sheets on your bed the night of your first shower and do not  sleep with pets. Day of Surgery : Do not apply any lotions/deodorants the morning of surgery.  Please wear clean clothes to the hospital/surgery center.  FAILURE TO FOLLOW THESE INSTRUCTIONS MAY RESULT IN THE CANCELLATION OF YOUR SURGERY  PATIENT SIGNATURE_________________________________  NURSE SIGNATURE__________________________________  ________________________________________________________________________  Adam Phenix  An incentive spirometer is a tool that can help keep your lungs clear and active. This tool measures how well you are filling your lungs with each breath. Taking long deep breaths may help reverse or decrease the chance of developing breathing (pulmonary) problems (especially infection) following: A long period of time when you are unable to move or be active. BEFORE THE PROCEDURE  If the spirometer includes an indicator to show your best effort, your nurse or respiratory therapist will set it to a desired goal. If possible, sit up straight or lean slightly forward. Try not to slouch. Hold the incentive spirometer in an upright position. INSTRUCTIONS FOR USE  Sit on the edge of your bed if possible, or sit up as far as you can in bed or on a chair. Hold the incentive spirometer in an upright position. Breathe out normally. Place the mouthpiece in your mouth and seal your lips tightly  around it. Breathe in slowly and as deeply as possible, raising the piston or the ball toward the top of the column. Hold your breath for 3-5 seconds or for as long as possible. Allow the piston or ball to fall to the bottom of the column. Remove the mouthpiece from your mouth and breathe out normally. Rest for a few seconds and repeat Steps 1 through 7 at least 10 times every 1-2 hours when you are awake. Take your time and take a few normal breaths between deep breaths. The spirometer may include an indicator to show your best effort. Use the indicator as a goal to work toward during each repetition. After each set of 10 deep breaths, practice coughing to be sure your lungs are clear. If you have an incision (the cut made at the time of surgery), support your incision when coughing by placing a pillow or rolled up towels firmly against it. Once you are able to get out of bed, walk around indoors and cough well. You may stop using the incentive spirometer when instructed by your caregiver.  RISKS AND COMPLICATIONS Take your time so you do not get dizzy or light-headed. If you are in pain, you may need to take or ask for pain medication before doing incentive spirometry. It is harder to take a deep breath if you are having pain. AFTER USE Rest and breathe slowly and easily. It can be helpful to keep track of a log of your progress. Your caregiver can provide you with a simple table to help with this. If you are using the spirometer at home, follow these instructions: [place] IF:  You are having difficultly using the spirometer. You have trouble using the spirometer as often as instructed. Your pain medication is not giving enough relief while using the spirometer. You develop fever of 100.5 F (38.1 C) or higher. SEEK IMMEDIATE MEDICAL CARE IF:  You cough up bloody sputum that had not  been present before. You develop fever of 102 F (38.9 C) or greater. You develop worsening pain  at or near the incision site. MAKE SURE YOU:  Understand these instructions. Will watch your condition. Will get help right away if you are not doing well or get worse. Document Released: 05/21/2006 Document Revised: 04/02/2011 Document Reviewed: 07/22/2006 [facility] Patient Information 2014 [place], [place].   ________________________________________________________________________

## 2022-03-05 NOTE — Progress Notes (Signed)
COVID Vaccine Completed: yes  Date of COVID positive in last 90 days:  PCP - Carolann Littler, MD Cardiologist - Carlyle Dolly, MD  Cardiac clearance by Finis Bud 01/29/22 in [place] clearance by Bruce Burchette 01/01/22 in Epic   Chest x-ray -  EKG - 01/29/22 Epic Stress Test - 04/11/11 Epic ECHO - 06/28/11 Epic Cardiac Cath -  Pacemaker/ICD device last checked: Spinal Cord Stimulator:  Bowel Prep -   Sleep Study -  CPAP -   Fasting Blood Sugar -  Checks Blood Sugar _____ times a day  Last dose of GLP1 agonist-  N/A GLP1 instructions:  N/A   Last dose of SGLT-2 inhibitors-  N/A SGLT-2 instructions: N/A   Blood Thinner Instructions: Aspirin Instructions: Last Dose:  Activity level:  Can go up a flight of stairs and perform activities of daily living without stopping and without symptoms of chest pain or shortness of breath.  Able to exercise without symptoms  Unable to go up a flight of stairs without symptoms of     Anesthesia review: OSA, HTN, palpitations, brain and spine meningiomas, DM2  Patient denies shortness of breath, fever, cough and chest pain at PAT appointment  Patient verbalized understanding of instructions that were given to them at the PAT appointment. Patient was also instructed that they will need to review over the PAT instructions again at home before surgery.

## 2022-03-06 ENCOUNTER — Encounter (HOSPITAL_COMMUNITY): Payer: Self-pay

## 2022-03-06 ENCOUNTER — Encounter (HOSPITAL_COMMUNITY)
Admission: RE | Admit: 2022-03-06 | Discharge: 2022-03-06 | Disposition: A | Discharge status: Home / Self Care | Payer: Medicare Other | Source: Ambulatory Visit | Attending: Orthopedic Surgery | Admitting: Orthopedic Surgery

## 2022-03-06 VITALS — BP 147/72 | HR 67 | Temp 97.9°F | Resp 16 | Ht 64.0 in | Wt 225.0 lb

## 2022-03-06 DIAGNOSIS — G4733 Obstructive sleep apnea (adult) (pediatric): Secondary | ICD-10-CM | POA: Diagnosis not present

## 2022-03-06 DIAGNOSIS — Z01812 Encounter for preprocedural laboratory examination: Secondary | ICD-10-CM | POA: Diagnosis present

## 2022-03-06 DIAGNOSIS — M1711 Unilateral primary osteoarthritis, right knee: Secondary | ICD-10-CM | POA: Insufficient documentation

## 2022-03-06 DIAGNOSIS — Z7984 Long term (current) use of oral hypoglycemic drugs: Secondary | ICD-10-CM | POA: Insufficient documentation

## 2022-03-06 DIAGNOSIS — I129 Hypertensive chronic kidney disease with stage 1 through stage 4 chronic kidney disease, or unspecified chronic kidney disease: Secondary | ICD-10-CM | POA: Diagnosis not present

## 2022-03-06 DIAGNOSIS — N181 Chronic kidney disease, stage 1: Secondary | ICD-10-CM | POA: Diagnosis not present

## 2022-03-06 DIAGNOSIS — E1122 Type 2 diabetes mellitus with diabetic chronic kidney disease: Secondary | ICD-10-CM | POA: Insufficient documentation

## 2022-03-06 DIAGNOSIS — K219 Gastro-esophageal reflux disease without esophagitis: Secondary | ICD-10-CM | POA: Diagnosis not present

## 2022-03-06 DIAGNOSIS — Z01818 Encounter for other preprocedural examination: Secondary | ICD-10-CM

## 2022-03-06 DIAGNOSIS — I10 Essential (primary) hypertension: Secondary | ICD-10-CM

## 2022-03-06 HISTORY — DX: Dizziness and giddiness: R42

## 2022-03-06 LAB — BASIC METABOLIC PANEL
Anion gap: 9 (ref 5–15)
BUN: 17 mg/dL (ref 8–23)
CO2: 26 mmol/L (ref 22–32)
Calcium: 8.9 mg/dL (ref 8.9–10.3)
Chloride: 104 mmol/L (ref 98–111)
Creatinine, Ser: 1.01 mg/dL — ABNORMAL HIGH (ref 0.44–1.00)
GFR, Estimated: 57 mL/min — ABNORMAL LOW (ref >60)
Glucose, Bld: 116 mg/dL — ABNORMAL HIGH (ref 70–99)
Potassium: 4.1 mmol/L (ref 3.5–5.1)
Sodium: 139 mmol/L (ref 135–145)

## 2022-03-06 LAB — CBC
HCT: 39 % (ref 36.0–46.0)
Hemoglobin: 12.3 g/dL (ref 12.0–15.0)
MCH: 26.4 pg (ref 26.0–34.0)
MCHC: 31.5 g/dL (ref 30.0–36.0)
MCV: 83.7 fL (ref 80.0–100.0)
Platelets: 168 10*3/uL (ref 150–400)
RBC: 4.66 MIL/uL (ref 3.87–5.11)
RDW: 15.1 % (ref 11.5–15.5)
WBC: 4.4 10*3/uL (ref 4.0–10.5)
nRBC: 0 % (ref 0.0–0.2)

## 2022-03-06 LAB — HEMOGLOBIN A1C
Hgb A1c MFr Bld: 5.9 % — ABNORMAL HIGH (ref 4.8–5.6)
Mean Plasma Glucose: 122.63 mg/dL

## 2022-03-06 LAB — SURGICAL PCR SCREEN
MRSA, PCR: NEGATIVE
Staphylococcus aureus: NEGATIVE

## 2022-03-06 LAB — GLUCOSE, CAPILLARY: Glucose-Capillary: 120 mg/dL — ABNORMAL HIGH (ref 70–99)

## 2022-03-07 NOTE — Progress Notes (Signed)
Anesthesia Chart Review   Case: [id number] Date/Time: 03/19/22 0700   Procedure: TOTAL KNEE ARTHROPLASTY (Right: Knee)   Anesthesia type: Choice   Pre-op diagnosis: right knee osteoarthritis   Location: [place] [facility] / [facility]   Surgeons: Gaynelle Arabian, MD       DISCUSSION:81 y.o. never smoker with h/o HTN, GERD, DM II, OSA, right knee OA scheduled for above procedure 03/19/2022 with Dr. Gaynelle Arabian.   Pt seen by cardiology 01/29/2022 for preoperative evaluation.  Per OV note, "Ms. Sanfilippo's perioperative risk of a major cardiac event is 0.4% according to the Revised Cardiac Risk Index (RCRI).  Therefore, she is at low risk for perioperative complications.   Her functional capacity is fair at 5.07 METs according to the Duke Activity Status Index (DASI). Recommendations: According to ACC/AHA guidelines, no further cardiovascular testing needed.  The patient may proceed to surgery at acceptable risk.   Antiplatelet and/or Anticoagulation Recommendations: She is not on any antiplatelet or anticoagulation therapy.  Does not require SBE prophylaxis prior to dental surgery.  Will route this note to requesting party."  Pt cleared by PCP, on chart.   Anticipate pt can proceed with planned procedure barring acute status change.   VS: BP (!) 147/72   Pulse 67   Temp 36.6 C (Oral)   Resp 16   Ht 5' 4"$  (1.626 m)   Wt 102.1 kg   SpO2 98%   BMI 38.62 kg/m   PROVIDERS: Eulas Post, MD is PCP   Cardiologist:  Carlyle Dolly, MD     LABS: Labs reviewed: Acceptable for surgery. (all labs ordered are listed, but only abnormal results are displayed)  Labs Reviewed  HEMOGLOBIN A1C - Abnormal; Notable for the following components:      Result Value   Hgb A1c MFr Bld 5.9 (*)    All other components within normal limits  BASIC METABOLIC PANEL - Abnormal; Notable for the following components:   Glucose, Bld 116 (*)    Creatinine, Ser 1.01 (*)    GFR, Estimated 57 (*)    All other  components within normal limits  GLUCOSE, CAPILLARY - Abnormal; Notable for the following components:   Glucose-Capillary 120 (*)    All other components within normal limits  SURGICAL PCR SCREEN  CBC     IMAGES:   EKG:   CV:  Past Medical History:  Diagnosis Date   Allergy    Anxiety    Arthritis    Asthma    patient denies    Cataract    bilateral lens implants    Colon polyps    Depression    Diabetes mellitus    Type two   Diverticulitis    Double vision 2019   left eye   Family history of adverse reaction to anesthesia    daughter- N/V    Family history of ovarian cancer    Family history of pancreatic cancer    Frequency of urination    GERD (gastroesophageal reflux disease)    History of hiatal hernia    Hyperglycemia    Hyperlipidemia    Hypertension    Hyperthyroidism    PT HAD BIPOSY -NEGATIVE.Marland Kitchen AND NOW JUST GETS CHECKED EACH YEAR .Marland Kitchen 2013 LAST TEST   SEES DR. [name].    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 DOES NOT USE C PAP   Palpitations    Peptic ulcer disease  Pneumonia    "walking pneumonia" hx of   Seizures ([place]) 2015   one siezure with brain surgery none since 2015    Sleep apnea    no CPAP   Urinary tract infection 03/2020   Vertigo     Past Surgical History:  Procedure Laterality Date   ABDOMINAL HYSTERECTOMY     ANTERIOR CERVICAL DECOMP/DISCECTOMY FUSION N/A 04/19/2021   Procedure: Anterior Cervical Discectomy and Fusion with Interbody Prosthesis, Plates/Screws Cervical Three-Four/Cervical Four-Five REMoval CERVICAL PLATE;  Surgeon: Newman Pies, MD;  Location: [facility];  Service: Neurosurgery;  Laterality: N/A;  3C   BACK SURGERY     L SPIN 2003   NECK 2004   BRAIN MENINGIOMA EXCISION  08/2008   X 2   C5-C6 neck fusion     COLONOSCOPY  05/05/2009   pt states multiple polyps removed in both colonoscopies she has had   CRANIOTOMY  08/13/2011   Procedure: CRANIOTOMY  TUMOR EXCISION;  Surgeon: [name], MD;  Location: [place] [facility]UR[facility] ORS;  Service: Neurosurgery;  Laterality: Bilateral;  Bifrontal craniotomy for tumor   DILATION AND CURETTAGE OF UTERUS     YEARS AGO   L4-L5 posterior la  2021   L3-L5 fusion   left foot plantar and hammertoe     right foot bunectomy     right knee arthroscopy     x 3   right shoulder rotator cuff     ROBOTIC ASSISTED BILATERAL SALPINGO OOPHERECTOMY N/A 08/25/2015   Procedure: XI ROBOTIC ASSISTED BILATERAL SALPINGO OOPHORECTOMY;  Surgeon: [name], MD;  Location: [facility];  Service: Gynecology;  Laterality: N/A;   TONSILLECTOMY     TUBAL LIGATION     WRIST SURGERY Right     MEDICATIONS:  Flaxseed, Linseed, (FLAX SEED OIL) 1000 MG CAPS   Accu-Chek Softclix Lancets lancets   acetaminophen (TYLENOL) 650 MG CR tablet   amLODipine (NORVASC) 5 MG tablet   ascorbic acid (VITAMIN C) 500 MG tablet   cetirizine (ZYRTEC) 10 MG tablet   furosemide (LASIX) 40 MG tablet   glucose blood (ACCU-CHEK AVIVA PLUS) test strip   Iron-Vitamins (GERITOL COMPLETE PO)   Lancet Devices (ACCU-CHEK SOFTCLIX) lancets   levothyroxine (SYNTHROID) 75 MCG tablet   losartan-hydrochlorothiazide (HYZAAR) 100-12.5 MG tablet   Magnesium 250 MG TABS   meclizine (ANTIVERT) 25 MG tablet   metoprolol succinate (TOPROL-XL) 50 MG 24 hr tablet   potassium chloride (KLOR-CON) 10 MEQ tablet   Propylene Glycol (SYSTANE BALANCE) 0.6 % SOLN   RABEprazole (ACIPHEX) 20 MG tablet   rosuvastatin (CRESTOR) 10 MG tablet   SitaGLIPtin-MetFORMIN HCl (JANUMET XR) 50-500 MG TB24   solifenacin (VESICARE) 5 MG tablet   valACYclovir (VALTREX) 1000 MG tablet   No current facility-administered medications for this encounter.     Konrad Felix Ward, PA-C [facility] Pre-Surgical Testing [phone]

## 2022-03-17 NOTE — Anesthesia Preprocedure Evaluation (Addendum)
Anesthesia Evaluation  Patient identified by MRN, date of birth, ID band Patient awake    Reviewed: Allergy & Precautions, H&P , NPO status , Patient's Chart, lab work & pertinent test results  History of Anesthesia Complications (+) Family history of anesthesia reaction  Airway Mallampati: II  TM Distance: >3 FB Neck ROM: Full    Dental no notable dental hx. (+) Edentulous Upper, Missing, Dental Advisory Given,    Pulmonary neg pulmonary ROS, asthma , sleep apnea , pneumonia   Pulmonary exam normal breath sounds clear to auscultation       Cardiovascular Exercise Tolerance: Good hypertension, Pt. on medications and Pt. on home beta blockers negative cardio ROS Normal cardiovascular exam Rhythm:Regular Rate:Normal     Neuro/Psych Seizures -,  PSYCHIATRIC DISORDERS Anxiety Depression     Neuromuscular disease negative neurological ROS  negative psych ROS   GI/Hepatic negative GI ROS, Neg liver ROS, hiatal hernia, PUD,GERD  Medicated,,  Endo/Other  negative endocrine ROSdiabetes, Type 2Hypothyroidism Hyperthyroidism Morbid obesity  Renal/GU negative Renal ROS  negative genitourinary   Musculoskeletal negative musculoskeletal ROS (+) Arthritis ,    Abdominal   Peds negative pediatric ROS (+)  Hematology negative hematology ROS (+)   Anesthesia Other Findings If patient wants regional, review of Lumbar spine films show L5-S1 may have a clear pass for spinal.   Reproductive/Obstetrics negative OB ROS                             Anesthesia Physical Anesthesia Plan  ASA: 3  Anesthesia Plan: General and Regional   Post-op Pain Management: Minimal or no pain anticipated, Tylenol PO (pre-op)* and Regional block*   Induction: Intravenous  PONV Risk Score and Plan: 3 and Ondansetron, Dexamethasone and Treatment may vary due to age or medical condition  Airway Management Planned: LMA and Oral  ETT  Additional Equipment: None  Intra-op Plan:   Post-operative Plan: Extubation in OR  Informed Consent: I have reviewed the patients History and Physical, chart, labs and discussed the procedure including the risks, benefits and alternatives for the proposed anesthesia with the patient or authorized representative who has indicated his/her understanding and acceptance.     Dental advisory given  Plan Discussed with: CRNA and Surgeon  Anesthesia Plan Comments: ( DISCUSSION:82 y.o. never smoker with h/o HTN, GERD, DM II, OSA, right knee OA scheduled for above procedure 03/19/2022 with Dr. Gaynelle Arabian.    Pt seen by cardiology 01/29/2022 for preoperative evaluation.  Per OV note, "Ms. Caskey's perioperative risk of a major cardiac event is 0.4% according to the Revised Cardiac Risk Index (RCRI).  Therefore, she is at low risk for perioperative complications.   Her functional capacity is fair at 5.07 METs according to the Duke Activity Status Index (DASI). Recommendations: According to ACC/AHA guidelines, no further cardiovascular testing needed.  The patient may proceed to surgery at acceptable risk.   Antiplatelet and/or Anticoagulation Recommendations: She is not on any antiplatelet or anticoagulation therapy.  Does not require SBE prophylaxis prior to dental surgery.  Will route this note to requesting party."   )       Anesthesia Quick Evaluation

## 2022-03-19 ENCOUNTER — Other Ambulatory Visit: Payer: Self-pay

## 2022-03-19 ENCOUNTER — Encounter (HOSPITAL_COMMUNITY)
Admission: RE | Disposition: A | Discharge status: Home / Self Care | Payer: Self-pay | Source: Ambulatory Visit | Attending: Orthopedic Surgery

## 2022-03-19 ENCOUNTER — Ambulatory Visit (HOSPITAL_COMMUNITY)
Admission: RE | Admit: 2022-03-19 | Discharge: 2022-03-20 | Disposition: A | Discharge status: Home / Self Care | Payer: Medicare Other | Source: Ambulatory Visit | Attending: Orthopedic Surgery | Admitting: Orthopedic Surgery

## 2022-03-19 ENCOUNTER — Ambulatory Visit (HOSPITAL_BASED_OUTPATIENT_CLINIC_OR_DEPARTMENT_OTHER): Payer: Medicare Other | Admitting: Anesthesiology

## 2022-03-19 ENCOUNTER — Encounter (HOSPITAL_COMMUNITY): Payer: Self-pay | Admitting: Orthopedic Surgery

## 2022-03-19 ENCOUNTER — Ambulatory Visit (HOSPITAL_COMMUNITY): Payer: Medicare Other | Admitting: Physician Assistant

## 2022-03-19 DIAGNOSIS — Z8711 Personal history of peptic ulcer disease: Secondary | ICD-10-CM | POA: Insufficient documentation

## 2022-03-19 DIAGNOSIS — I129 Hypertensive chronic kidney disease with stage 1 through stage 4 chronic kidney disease, or unspecified chronic kidney disease: Secondary | ICD-10-CM | POA: Diagnosis not present

## 2022-03-19 DIAGNOSIS — N183 Chronic kidney disease, stage 3 unspecified: Secondary | ICD-10-CM | POA: Diagnosis not present

## 2022-03-19 DIAGNOSIS — M1711 Unilateral primary osteoarthritis, right knee: Secondary | ICD-10-CM | POA: Diagnosis not present

## 2022-03-19 DIAGNOSIS — K449 Diaphragmatic hernia without obstruction or gangrene: Secondary | ICD-10-CM | POA: Insufficient documentation

## 2022-03-19 DIAGNOSIS — Z9289 Personal history of other medical treatment: Secondary | ICD-10-CM

## 2022-03-19 DIAGNOSIS — F418 Other specified anxiety disorders: Secondary | ICD-10-CM | POA: Diagnosis not present

## 2022-03-19 DIAGNOSIS — N181 Chronic kidney disease, stage 1: Secondary | ICD-10-CM

## 2022-03-19 DIAGNOSIS — E119 Type 2 diabetes mellitus without complications: Secondary | ICD-10-CM

## 2022-03-19 DIAGNOSIS — I10 Essential (primary) hypertension: Secondary | ICD-10-CM

## 2022-03-19 DIAGNOSIS — M179 Osteoarthritis of knee, unspecified: Secondary | ICD-10-CM

## 2022-03-19 DIAGNOSIS — Z7984 Long term (current) use of oral hypoglycemic drugs: Secondary | ICD-10-CM

## 2022-03-19 DIAGNOSIS — G4733 Obstructive sleep apnea (adult) (pediatric): Secondary | ICD-10-CM | POA: Diagnosis not present

## 2022-03-19 DIAGNOSIS — E1122 Type 2 diabetes mellitus with diabetic chronic kidney disease: Secondary | ICD-10-CM | POA: Diagnosis not present

## 2022-03-19 DIAGNOSIS — K219 Gastro-esophageal reflux disease without esophagitis: Secondary | ICD-10-CM | POA: Diagnosis not present

## 2022-03-19 HISTORY — PX: TOTAL KNEE ARTHROPLASTY: SHX125

## 2022-03-19 HISTORY — DX: Personal history of other medical treatment: Z92.89

## 2022-03-19 LAB — GLUCOSE, CAPILLARY
Glucose-Capillary: 113 mg/dL — ABNORMAL HIGH (ref 70–99)
Glucose-Capillary: 136 mg/dL — ABNORMAL HIGH (ref 70–99)
Glucose-Capillary: 183 mg/dL — ABNORMAL HIGH (ref 70–99)
Glucose-Capillary: 177 mg/dL — ABNORMAL HIGH (ref 70–99)
Glucose-Capillary: 153 mg/dL — ABNORMAL HIGH (ref 70–99)

## 2022-03-19 SURGERY — ARTHROPLASTY, KNEE, TOTAL
Anesthesia: Regional | Site: Knee | Laterality: Right

## 2022-03-19 MED ORDER — PROPYLENE GLYCOL 0.6 % OP SOLN: 1.0000 [drp] | Freq: Every day | OPHTHALMIC | Status: DC | PRN

## 2022-03-19 MED ORDER — HYDROMORPHONE HCL 1 MG/ML IJ SOLN: 1.0000 mg | INTRAMUSCULAR | Status: DC | PRN

## 2022-03-19 MED ORDER — BUPIVACAINE LIPOSOME 1.3 % IJ SUSP: 20.0000 mL | Freq: Once | INTRAMUSCULAR | Status: DC

## 2022-03-19 MED ORDER — LACTATED RINGERS IV SOLN: INTRAVENOUS | Status: DC

## 2022-03-19 MED ORDER — PANTOPRAZOLE SODIUM 40 MG PO TBEC
40.0000 mg | DELAYED_RELEASE_TABLET | Freq: Every day | ORAL | Status: DC
  Administered 2022-03-20: 40 mg via ORAL
  Filled 2022-03-19: qty 1

## 2022-03-19 MED ORDER — POTASSIUM CHLORIDE ER 10 MEQ PO TBCR
10.0000 meq | EXTENDED_RELEASE_TABLET | Freq: Every day | ORAL | Status: DC
  Administered 2022-03-19 – 2022-03-20 (×2): 10 meq via ORAL
  Filled 2022-03-19 (×4): qty 1

## 2022-03-19 MED ORDER — ONDANSETRON HCL 4 MG/2ML IJ SOLN
INTRAMUSCULAR | Status: AC
  Filled 2022-03-19: qty 2

## 2022-03-19 MED ORDER — DEXAMETHASONE SODIUM PHOSPHATE 10 MG/ML IJ SOLN
8.0000 mg | Freq: Once | INTRAMUSCULAR | Status: AC
  Administered 2022-03-19: 8 mg via INTRAVENOUS

## 2022-03-19 MED ORDER — PROPOFOL 10 MG/ML IV BOLUS
INTRAVENOUS | Status: AC
  Filled 2022-03-19: qty 20

## 2022-03-19 MED ORDER — METHOCARBAMOL 500 MG IVPB - SIMPLE MED: 500.0000 mg | Freq: Four times a day (QID) | INTRAVENOUS | Status: DC | PRN

## 2022-03-19 MED ORDER — INSULIN ASPART 100 UNIT/ML IJ SOLN
0.0000 [IU] | Freq: Three times a day (TID) | INTRAMUSCULAR | Status: DC
  Administered 2022-03-19 (×2): 3 [IU] via SUBCUTANEOUS
  Administered 2022-03-20: 2 [IU] via SUBCUTANEOUS

## 2022-03-19 MED ORDER — BUPIVACAINE LIPOSOME 1.3 % IJ SUSP
INTRAMUSCULAR | Status: AC
  Filled 2022-03-19: qty 20

## 2022-03-19 MED ORDER — ORAL CARE MOUTH RINSE: 15.0000 mL | Freq: Once | OROMUCOSAL | Status: AC

## 2022-03-19 MED ORDER — SODIUM CHLORIDE 0.9 % IR SOLN
Status: DC | PRN
  Administered 2022-03-19: 3000 mL

## 2022-03-19 MED ORDER — ACETAMINOPHEN 10 MG/ML IV SOLN
1000.0000 mg | Freq: Four times a day (QID) | INTRAVENOUS | Status: DC
  Administered 2022-03-19: 1000 mg via INTRAVENOUS
  Filled 2022-03-19: qty 100

## 2022-03-19 MED ORDER — ONDANSETRON HCL 4 MG/2ML IJ SOLN: 4.0000 mg | Freq: Four times a day (QID) | INTRAMUSCULAR | Status: DC | PRN

## 2022-03-19 MED ORDER — MENTHOL 3 MG MT LOZG: 1.0000 | LOZENGE | OROMUCOSAL | Status: DC | PRN

## 2022-03-19 MED ORDER — FENTANYL CITRATE (PF) 100 MCG/2ML IJ SOLN
INTRAMUSCULAR | Status: DC | PRN
  Administered 2022-03-19 (×2): 25 ug via INTRAVENOUS
  Administered 2022-03-19 (×2): 50 ug via INTRAVENOUS
  Administered 2022-03-19 (×2): 25 ug via INTRAVENOUS

## 2022-03-19 MED ORDER — SODIUM CHLORIDE 0.9 % IV SOLN: INTRAVENOUS | Status: DC

## 2022-03-19 MED ORDER — LOSARTAN POTASSIUM-HCTZ 100-12.5 MG PO TABS: 0.5000 | ORAL_TABLET | Freq: Every day | ORAL | Status: DC

## 2022-03-19 MED ORDER — FENTANYL CITRATE PF 50 MCG/ML IJ SOSY
25.0000 ug | PREFILLED_SYRINGE | INTRAMUSCULAR | Status: DC | PRN
  Administered 2022-03-19 (×2): 25 ug via INTRAVENOUS

## 2022-03-19 MED ORDER — DIPHENHYDRAMINE HCL 12.5 MG/5ML PO ELIX: 12.5000 mg | ORAL_SOLUTION | ORAL | Status: DC | PRN

## 2022-03-19 MED ORDER — METHOCARBAMOL 500 MG IVPB - SIMPLE MED
INTRAVENOUS | Status: AC
  Administered 2022-03-19: 500 mg via INTRAVENOUS
  Filled 2022-03-19: qty 55

## 2022-03-19 MED ORDER — AMLODIPINE BESYLATE 5 MG PO TABS
5.0000 mg | ORAL_TABLET | Freq: Every day | ORAL | Status: DC
  Administered 2022-03-20: 5 mg via ORAL
  Filled 2022-03-19: qty 1

## 2022-03-19 MED ORDER — SUGAMMADEX SODIUM 200 MG/2ML IV SOLN
INTRAVENOUS | Status: DC | PRN
  Administered 2022-03-19: 200 mg via INTRAVENOUS

## 2022-03-19 MED ORDER — METOCLOPRAMIDE HCL 5 MG/ML IJ SOLN: 5.0000 mg | Freq: Three times a day (TID) | INTRAMUSCULAR | Status: DC | PRN

## 2022-03-19 MED ORDER — HYDROCHLOROTHIAZIDE 12.5 MG PO TABS
12.5000 mg | ORAL_TABLET | Freq: Every day | ORAL | Status: DC
  Administered 2022-03-20: 12.5 mg via ORAL
  Filled 2022-03-19: qty 1

## 2022-03-19 MED ORDER — ACETAMINOPHEN 500 MG PO TABS
1000.0000 mg | ORAL_TABLET | Freq: Four times a day (QID) | ORAL | Status: AC
  Administered 2022-03-19 – 2022-03-20 (×3): 1000 mg via ORAL
  Filled 2022-03-19 (×3): qty 2

## 2022-03-19 MED ORDER — LABETALOL HCL 5 MG/ML IV SOLN
INTRAVENOUS | Status: AC
  Filled 2022-03-19: qty 4

## 2022-03-19 MED ORDER — SODIUM CHLORIDE (PF) 0.9 % IJ SOLN
INTRAMUSCULAR | Status: DC | PRN
  Administered 2022-03-19: 10 mL
  Administered 2022-03-19: 50 mL

## 2022-03-19 MED ORDER — PROPOFOL 10 MG/ML IV BOLUS
INTRAVENOUS | Status: DC | PRN
  Administered 2022-03-19: 160 mg via INTRAVENOUS

## 2022-03-19 MED ORDER — FENTANYL CITRATE (PF) 100 MCG/2ML IJ SOLN
INTRAMUSCULAR | Status: AC
  Filled 2022-03-19: qty 2

## 2022-03-19 MED ORDER — ROCURONIUM BROMIDE 100 MG/10ML IV SOLN
INTRAVENOUS | Status: DC | PRN
  Administered 2022-03-19: 50 mg via INTRAVENOUS

## 2022-03-19 MED ORDER — SODIUM CHLORIDE (PF) 0.9 % IJ SOLN
INTRAMUSCULAR | Status: AC
  Filled 2022-03-19: qty 10

## 2022-03-19 MED ORDER — LIDOCAINE HCL (PF) 2 % IJ SOLN
INTRAMUSCULAR | Status: AC
  Filled 2022-03-19: qty 5

## 2022-03-19 MED ORDER — MEPERIDINE HCL 50 MG/ML IJ SOLN: 6.2500 mg | INTRAMUSCULAR | Status: DC | PRN

## 2022-03-19 MED ORDER — METOCLOPRAMIDE HCL 5 MG PO TABS: 5.0000 mg | ORAL_TABLET | Freq: Three times a day (TID) | ORAL | Status: DC | PRN

## 2022-03-19 MED ORDER — OXYCODONE HCL 5 MG/5ML PO SOLN: 5.0000 mg | Freq: Once | ORAL | Status: DC | PRN

## 2022-03-19 MED ORDER — ACETAMINOPHEN 325 MG PO TABS
325.0000 mg | ORAL_TABLET | Freq: Four times a day (QID) | ORAL | Status: DC | PRN
  Administered 2022-03-20: 650 mg via ORAL
  Filled 2022-03-19: qty 2

## 2022-03-19 MED ORDER — LIDOCAINE HCL (CARDIAC) PF 100 MG/5ML IV SOSY
PREFILLED_SYRINGE | INTRAVENOUS | Status: DC | PRN
  Administered 2022-03-19: 60 mg via INTRAVENOUS

## 2022-03-19 MED ORDER — RIVAROXABAN 10 MG PO TABS
10.0000 mg | ORAL_TABLET | Freq: Every day | ORAL | Status: DC
  Administered 2022-03-20: 10 mg via ORAL
  Filled 2022-03-19: qty 1

## 2022-03-19 MED ORDER — CEFAZOLIN SODIUM-DEXTROSE 2-4 GM/100ML-% IV SOLN
2.0000 g | Freq: Four times a day (QID) | INTRAVENOUS | Status: AC
  Administered 2022-03-19 (×2): 2 g via INTRAVENOUS
  Filled 2022-03-19 (×2): qty 100

## 2022-03-19 MED ORDER — ONDANSETRON HCL 4 MG/2ML IJ SOLN: 4.0000 mg | Freq: Once | INTRAMUSCULAR | Status: DC | PRN

## 2022-03-19 MED ORDER — CEFAZOLIN SODIUM-DEXTROSE 2-4 GM/100ML-% IV SOLN
2.0000 g | INTRAVENOUS | Status: AC
  Administered 2022-03-19: 2 g via INTRAVENOUS
  Filled 2022-03-19: qty 100

## 2022-03-19 MED ORDER — MECLIZINE HCL 25 MG PO TABS: 25.0000 mg | ORAL_TABLET | Freq: Three times a day (TID) | ORAL | Status: DC | PRN

## 2022-03-19 MED ORDER — LABETALOL HCL 5 MG/ML IV SOLN
INTRAVENOUS | Status: DC | PRN
  Administered 2022-03-19 (×3): 2.5 mg via INTRAVENOUS
  Administered 2022-03-19: 5 mg via INTRAVENOUS

## 2022-03-19 MED ORDER — METHOCARBAMOL 500 MG PO TABS
500.0000 mg | ORAL_TABLET | Freq: Four times a day (QID) | ORAL | Status: DC | PRN
  Administered 2022-03-19: 500 mg via ORAL
  Filled 2022-03-19: qty 1

## 2022-03-19 MED ORDER — ONDANSETRON HCL 4 MG PO TABS: 4.0000 mg | ORAL_TABLET | Freq: Four times a day (QID) | ORAL | Status: DC | PRN

## 2022-03-19 MED ORDER — LOSARTAN POTASSIUM 50 MG PO TABS
100.0000 mg | ORAL_TABLET | Freq: Every day | ORAL | Status: DC
  Administered 2022-03-20: 100 mg via ORAL
  Filled 2022-03-19: qty 2

## 2022-03-19 MED ORDER — TRAMADOL HCL 50 MG PO TABS
50.0000 mg | ORAL_TABLET | Freq: Four times a day (QID) | ORAL | Status: DC | PRN
  Administered 2022-03-19 – 2022-03-20 (×4): 100 mg via ORAL
  Filled 2022-03-19 (×4): qty 2

## 2022-03-19 MED ORDER — MIDAZOLAM HCL 2 MG/2ML IJ SOLN
INTRAMUSCULAR | Status: AC
  Filled 2022-03-19: qty 2

## 2022-03-19 MED ORDER — ROSUVASTATIN CALCIUM 10 MG PO TABS
10.0000 mg | ORAL_TABLET | Freq: Every day | ORAL | Status: DC
  Administered 2022-03-20: 10 mg via ORAL
  Filled 2022-03-19: qty 1

## 2022-03-19 MED ORDER — DOCUSATE SODIUM 100 MG PO CAPS
100.0000 mg | ORAL_CAPSULE | Freq: Two times a day (BID) | ORAL | Status: DC
  Administered 2022-03-19 – 2022-03-20 (×2): 100 mg via ORAL
  Filled 2022-03-19 (×3): qty 1

## 2022-03-19 MED ORDER — LEVOTHYROXINE SODIUM 75 MCG PO TABS
75.0000 ug | ORAL_TABLET | Freq: Every day | ORAL | Status: DC
  Administered 2022-03-20: 75 ug via ORAL
  Filled 2022-03-19: qty 1

## 2022-03-19 MED ORDER — ACETAMINOPHEN 500 MG PO TABS: 1000.0000 mg | ORAL_TABLET | Freq: Once | ORAL | Status: DC

## 2022-03-19 MED ORDER — OXYCODONE HCL 5 MG PO TABS: 5.0000 mg | ORAL_TABLET | Freq: Once | ORAL | Status: DC | PRN

## 2022-03-19 MED ORDER — POLYVINYL ALCOHOL 1.4 % OP SOLN: 1.0000 [drp] | OPHTHALMIC | Status: DC | PRN

## 2022-03-19 MED ORDER — POVIDONE-IODINE 10 % EX SWAB: 2.0000 | Freq: Once | CUTANEOUS | Status: DC

## 2022-03-19 MED ORDER — TRANEXAMIC ACID-NACL 1000-0.7 MG/100ML-% IV SOLN
1000.0000 mg | INTRAVENOUS | Status: DC
  Filled 2022-03-19: qty 100

## 2022-03-19 MED ORDER — PHENOL 1.4 % MT LIQD: 1.0000 | OROMUCOSAL | Status: DC | PRN

## 2022-03-19 MED ORDER — OXYCODONE HCL 5 MG PO TABS
5.0000 mg | ORAL_TABLET | ORAL | Status: DC | PRN
  Administered 2022-03-19: 5 mg via ORAL

## 2022-03-19 MED ORDER — DEXMEDETOMIDINE HCL IN NACL 80 MCG/20ML IV SOLN
INTRAVENOUS | Status: DC | PRN
  Administered 2022-03-19: 4 ug via BUCCAL

## 2022-03-19 MED ORDER — METOPROLOL SUCCINATE ER 50 MG PO TB24
50.0000 mg | ORAL_TABLET | Freq: Every day | ORAL | Status: DC
  Administered 2022-03-20: 50 mg via ORAL
  Filled 2022-03-19: qty 1

## 2022-03-19 MED ORDER — ONDANSETRON HCL 4 MG/2ML IJ SOLN
INTRAMUSCULAR | Status: DC | PRN
  Administered 2022-03-19: 4 mg via INTRAVENOUS

## 2022-03-19 MED ORDER — BUPIVACAINE LIPOSOME 1.3 % IJ SUSP
INTRAMUSCULAR | Status: DC | PRN
  Administered 2022-03-19: 20 mL

## 2022-03-19 MED ORDER — FENTANYL CITRATE PF 50 MCG/ML IJ SOSY
PREFILLED_SYRINGE | INTRAMUSCULAR | Status: AC
  Administered 2022-03-19: 25 ug via INTRAVENOUS
  Filled 2022-03-19: qty 2

## 2022-03-19 MED ORDER — FLEET ENEMA 7-19 GM/118ML RE ENEM: 1.0000 | ENEMA | Freq: Once | RECTAL | Status: DC | PRN

## 2022-03-19 MED ORDER — CHLORHEXIDINE GLUCONATE 0.12 % MT SOLN
15.0000 mL | Freq: Once | OROMUCOSAL | Status: AC
  Administered 2022-03-19: 15 mL via OROMUCOSAL

## 2022-03-19 MED ORDER — ACETAMINOPHEN 325 MG PO TABS: 325.0000 mg | ORAL_TABLET | ORAL | Status: DC | PRN

## 2022-03-19 MED ORDER — BISACODYL 10 MG RE SUPP: 10.0000 mg | Freq: Every day | RECTAL | Status: DC | PRN

## 2022-03-19 MED ORDER — ACETAMINOPHEN 160 MG/5ML PO SOLN: 325.0000 mg | ORAL | Status: DC | PRN

## 2022-03-19 MED ORDER — SODIUM CHLORIDE (PF) 0.9 % IJ SOLN
INTRAMUSCULAR | Status: AC
  Filled 2022-03-19: qty 50

## 2022-03-19 MED ORDER — DEXAMETHASONE SODIUM PHOSPHATE 10 MG/ML IJ SOLN
10.0000 mg | Freq: Once | INTRAMUSCULAR | Status: AC
  Administered 2022-03-20: 10 mg via INTRAVENOUS
  Filled 2022-03-19: qty 1

## 2022-03-19 MED ORDER — POLYETHYLENE GLYCOL 3350 17 G PO PACK: 17.0000 g | PACK | Freq: Every day | ORAL | Status: DC | PRN

## 2022-03-19 SURGICAL SUPPLY — 60 items
ATTUNE MED DOME PAT 38 KNEE (Knees) IMPLANT
ATTUNE PS FEM RT SZ 6 CEM KNEE (Femur) IMPLANT
ATTUNE PSRP INSR SZ6 10 KNEE (Insert) IMPLANT
BAG COUNTER SPONGE SURGICOUNT (BAG) IMPLANT
BAG SPEC THK2 15X12 ZIP CLS (MISCELLANEOUS) ×1
BAG SPNG CNTER NS LX DISP (BAG)
BAG ZIPLOCK 12X15 (MISCELLANEOUS) ×1 IMPLANT
BASE TIBIAL ROT PLAT SZ 5 KNEE (Knees) IMPLANT
BLADE SAG 18X100X1.27 (BLADE) ×1 IMPLANT
BLADE SAW SGTL 11.0X1.19X90.0M (BLADE) ×1 IMPLANT
BNDG CMPR 5X62 HK CLSR LF (GAUZE/BANDAGES/DRESSINGS) ×1
BNDG CMPR MED 10X6 ELC LF (GAUZE/BANDAGES/DRESSINGS) ×1
BNDG ELASTIC 6INX 5YD STR LF (GAUZE/BANDAGES/DRESSINGS) ×1 IMPLANT
BNDG ELASTIC 6X10 VLCR STRL LF (GAUZE/BANDAGES/DRESSINGS) IMPLANT
BOWL SMART MIX CTS (DISPOSABLE) ×1 IMPLANT
BSPLAT TIB 5 CMNT ROT PLAT STR (Knees) ×1 IMPLANT
CEMENT HV SMART SET (Cement) ×2 IMPLANT
COVER SURGICAL LIGHT HANDLE (MISCELLANEOUS) ×1 IMPLANT
CUFF TOURN SGL QUICK 34 (TOURNIQUET CUFF) ×1
CUFF TRNQT CYL 34X4.125X (TOURNIQUET CUFF) ×1 IMPLANT
DRAPE INCISE IOBAN 66X45 STRL (DRAPES) ×1 IMPLANT
DRAPE U-SHAPE 47X51 STRL (DRAPES) ×1 IMPLANT
DRSG AQUACEL AG ADV 3.5X10 (GAUZE/BANDAGES/DRESSINGS) ×1 IMPLANT
DURAPREP 26ML APPLICATOR (WOUND CARE) ×1 IMPLANT
ELECT REM PT RETURN 15FT ADLT (MISCELLANEOUS) ×1 IMPLANT
GLOVE BIO SURGEON STRL SZ 6.5 (GLOVE) IMPLANT
GLOVE BIO SURGEON STRL SZ7.5 (GLOVE) IMPLANT
GLOVE BIO SURGEON STRL SZ8 (GLOVE) ×1 IMPLANT
GLOVE BIOGEL PI IND STRL 6.5 (GLOVE) IMPLANT
GLOVE BIOGEL PI IND STRL 7.0 (GLOVE) IMPLANT
GLOVE BIOGEL PI IND STRL 8 (GLOVE) ×1 IMPLANT
GOWN STRL REUS W/ TWL LRG LVL3 (GOWN DISPOSABLE) ×1 IMPLANT
GOWN STRL REUS W/ TWL XL LVL3 (GOWN DISPOSABLE) IMPLANT
GOWN STRL REUS W/TWL LRG LVL3 (GOWN DISPOSABLE) ×1
GOWN STRL REUS W/TWL XL LVL3 (GOWN DISPOSABLE)
HANDPIECE INTERPULSE COAX TIP (DISPOSABLE) ×1
HOLDER FOLEY CATH W/STRAP (MISCELLANEOUS) IMPLANT
IMMOBILIZER KNEE 20 (SOFTGOODS) ×1
IMMOBILIZER KNEE 20 THIGH 36 (SOFTGOODS) ×2 IMPLANT
KIT TURNOVER KIT A (KITS) IMPLANT
MANIFOLD NEPTUNE II (INSTRUMENTS) ×1 IMPLANT
NS IRRIG 1000ML POUR BTL (IV SOLUTION) ×2 IMPLANT
PACK TOTAL KNEE CUSTOM (KITS) ×1 IMPLANT
PADDING CAST COTTON 6X4 STRL (CAST SUPPLIES) ×2 IMPLANT
PIN STEINMAN FIXATION KNEE (PIN) IMPLANT
PROTECTOR NERVE ULNAR (MISCELLANEOUS) ×1 IMPLANT
SET HNDPC FAN SPRY TIP SCT (DISPOSABLE) ×1 IMPLANT
SPIKE FLUID TRANSFER (MISCELLANEOUS) ×2 IMPLANT
STRIP CLOSURE SKIN 1/2X4 (GAUZE/BANDAGES/DRESSINGS) ×4 IMPLANT
SUT ETHIBOND NAB CT1 #1 30IN (SUTURE) IMPLANT
SUT MNCRL AB 4-0 PS2 18 (SUTURE) ×1 IMPLANT
SUT STRATAFIX 0 PDS 27 VIOLET (SUTURE) ×1
SUT VIC AB 2-0 CT1 27 (SUTURE) ×3
SUT VIC AB 2-0 CT1 TAPERPNT 27 (SUTURE) ×3 IMPLANT
SUTURE STRATFX 0 PDS 27 VIOLET (SUTURE) ×1 IMPLANT
TIBIAL BASE ROT PLAT SZ 5 KNEE (Knees) ×1 IMPLANT
TRAY FOLEY MTR SLVR 16FR STAT (SET/KITS/TRAYS/PACK) ×1 IMPLANT
TUBE SUCTION HIGH CAP CLEAR NV (SUCTIONS) ×1 IMPLANT
WATER STERILE IRR 1000ML POUR (IV SOLUTION) ×4 IMPLANT
WRAP KNEE MAXI GEL POST OP (GAUZE/BANDAGES/DRESSINGS) ×1 IMPLANT

## 2022-03-19 NOTE — Op Note (Signed)
OPERATIVE REPORT-TOTAL KNEE ARTHROPLASTY   Pre-operative diagnosis- Osteoarthritis  Right knee(s)  Post-operative diagnosis- Osteoarthritis Right knee(s)  Procedure-  Right  Total Knee Arthroplasty  Surgeon- Dione Plover. Yanitza Shvartsman, MD  Assistant- Laurice Record, PA-C   Anesthesia-  General and Regional  EBL-25 ml   Drains None  Tourniquet time-  Total Tourniquet Time Documented: Thigh (Right) - 46 minutes Total: Thigh (Right) - 46 minutes     Complications- None  Condition-PACU - hemodynamically stable.   Brief Clinical Note  Dawn Salas is a 82 y.o. year old female with end stage OA of her right knee with progressively worsening pain and dysfunction. She has constant pain, with activity and at rest and significant functional deficits with difficulties even with ADLs. She has had extensive non-op management including analgesics, injections of cortisone and viscosupplements, and home exercise program, but remains in significant pain with significant dysfunction.Radiographs show bone on bone arthritis medial and patellofemoral. She presents now for right Total Knee Arthroplasty.     Procedure in detail---   The patient is brought into the operating room and positioned supine on the operating table. After successful administration of  General and Regional,   a tourniquet is placed high on the  Right thigh(s) and the lower extremity is prepped and draped in the usual sterile fashion. Time out is performed by the operating team and then the  Right lower extremity is wrapped in Esmarch, knee flexed and the tourniquet inflated to 300 mmHg.       A midline incision is made with a ten blade through the subcutaneous tissue to the level of the extensor mechanism. A fresh blade is used to make a medial parapatellar arthrotomy. Soft tissue over the proximal medial tibia is subperiosteally elevated to the joint line with a knife and into the semimembranosus bursa with a Cobb elevator. Soft tissue  over the proximal lateral tibia is elevated with attention being paid to avoiding the patellar tendon on the tibial tubercle. The patella is everted, knee flexed 90 degrees and the ACL and PCL are removed. Findings are bone on bone medial and patellofemoral with massive global osteophytes        The drill is used to create a starting hole in the distal femur and the canal is thoroughly irrigated with sterile saline to remove the fatty contents. The 5 degree Right  valgus alignment guide is placed into the femoral canal and the distal femoral cutting block is pinned to remove 9 mm off the distal femur. Resection is made with an oscillating saw.      The tibia is subluxed forward and the menisci are removed. The extramedullary alignment guide is placed referencing proximally at the medial aspect of the tibial tubercle and distally along the second metatarsal axis and tibial crest. The block is pinned to remove 22m off the more deficient medial  side. Resection is made with an oscillating saw. Size 5is the most appropriate size for the tibia and the proximal tibia is prepared with the modular drill and keel punch for that size.      The femoral sizing guide is placed and size 6 is most appropriate. Rotation is marked off the epicondylar axis and confirmed by creating a rectangular flexion gap at 90 degrees. The size 6 cutting block is pinned in this rotation and the anterior, posterior and chamfer cuts are made with the oscillating saw. The intercondylar block is then placed and that cut is made.  Trial size 5 tibial component, trial size 6 posterior stabilized femur and a 10  mm posterior stabilized rotating platform insert trial is placed. Full extension is achieved with excellent varus/valgus and anterior/posterior balance throughout full range of motion. The patella is everted and thickness measured to be 24  mm. Free hand resection is taken to 14 mm, a 38 template is placed, lug holes are drilled, trial  patella is placed, and it tracks normally. Osteophytes are removed off the posterior femur with the trial in place. All trials are removed and the cut bone surfaces prepared with pulsatile lavage. Cement is mixed and once ready for implantation, the size 5 tibial implant, size  6 posterior stabilized femoral component, and the size 38 patella are cemented in place and the patella is held with the clamp. The trial insert is placed and the knee held in full extension. The Exparel (20 ml mixed with 60 ml saline) is injected into the extensor mechanism, posterior capsule, medial and lateral gutters and subcutaneous tissues.  All extruded cement is removed and once the cement is hard the permanent 10 mm posterior stabilized rotating platform insert is placed into the tibial tray.      The wound is copiously irrigated with saline solution and the extensor mechanism closed with # 0 Stratofix suture. The tourniquet is released for a total tourniquet time of 46  minutes. Flexion against gravity is 140 degrees and the patella tracks normally. Subcutaneous tissue is closed with 2.0 vicryl and subcuticular with running 4.0 Monocryl. The incision is cleaned and dried and steri-strips and a bulky sterile dressing are applied. The limb is placed into a knee immobilizer and the patient is awakened and transported to recovery in stable condition.      Please note that a surgical assistant was a medical necessity for this procedure in order to perform it in a safe and expeditious manner. Surgical assistant was necessary to retract the ligaments and vital neurovascular structures to prevent injury to them and also necessary for proper positioning of the limb to allow for anatomic placement of the prosthesis.   Dione Plover Meika Earll, MD    03/19/2022, 8:33 AM

## 2022-03-19 NOTE — Anesthesia Procedure Notes (Signed)
Anesthesia Regional Block: Adductor canal block   Pre-Anesthetic Checklist: , timeout performed,  Correct Patient, Correct Site, Correct Laterality,  Correct Procedure, Correct Position, site marked,  Risks and benefits discussed,  Surgical consent,  Pre-op evaluation,  At surgeon's request and post-op pain management  Laterality: Right  Prep: chloraprep       Needles:  Injection technique: Single-shot  Needle Type: Echogenic Stimulator Needle     Needle Length: 5cm  Needle Gauge: 22     Additional Needles:   Procedures:, nerve stimulator,,, ultrasound used (permanent image in chart),,    Narrative:  Start time: 03/19/2022 7:35 AM End time: 03/19/2022 7:40 AM Injection made incrementally with aspirations every 5 mL.  Performed by: Personally  Anesthesiologist: Janeece Riggers, MD  Additional Notes: Functioning IV was confirmed and monitors were applied.  A 65m 22ga Arrow echogenic stimulator needle was used. Sterile prep and drape,hand hygiene and sterile gloves were used. Ultrasound guidance: relevant anatomy identified, needle position confirmed, local anesthetic spread visualized around nerve(s)., vascular puncture avoided.  Image printed for medical record. Negative aspiration and negative test dose prior to incremental administration of local anesthetic. The patient tolerated the procedure well.

## 2022-03-19 NOTE — Evaluation (Signed)
Physical Therapy Evaluation Patient Details Name: Dawn Salas MRN: [id number] DOB: 1940-05-17 Today's Date: 03/19/2022  History of Present Illness  82 yo female s/p R TKA on 03/19/22.  PMH: ACDF, arthritis, HTN, depression, craniotomy, meningioma x2, lumbar surgery, cervical sugery.  Clinical Impression  Pt is s/p TKA resulting in the deficits listed below (see PT Problem List).   Pt motivated to work with PT. Amb ~ 82' with RW and min assist. Anticipate steady progress in acute setting. Pt reports excellent home support from her 6 children with her 2 dtrs Letta Median and Helene Kelp being her 1* caregivers.    Pt will benefit from skilled PT to increase their independence and safety with mobility to allow discharge to the venue listed below.         Recommendations for follow up therapy are one component of a multi-disciplinary discharge planning process, led by the attending physician.  Recommendations may be updated based on patient status, additional functional criteria and insurance authorization.  Follow Up Recommendations Follow physician's recommendations for discharge plan and follow up therapies      Assistance Recommended at Discharge Intermittent Supervision/Assistance  Patient can return home with the following  A little help with walking and/or transfers;A little help with bathing/dressing/bathroom;Help with stairs or ramp for entrance;Assist for transportation;Assistance with cooking/housework    Equipment Recommendations None recommended by PT  Recommendations for Other Services       Functional Status Assessment Patient has had a recent decline in their functional status and demonstrates the ability to make significant improvements in function in a reasonable and predictable amount of time.     Precautions / Restrictions Precautions Precautions: Fall;Knee Restrictions Weight Bearing Restrictions: No Other Position/Activity Restrictions: WBAT      Mobility  Bed  Mobility Overal bed mobility: Needs Assistance Bed Mobility: Supine to Sit     Supine to sit: Min assist     General bed mobility comments: assist to initiate RLE off bed, able to then self assist an d complete with incr time    Transfers Overall transfer level: Needs assistance Equipment used: Rolling walker (2 wheels) Transfers: Sit to/from Stand Sit to Stand: Min assist           General transfer comment: cues for hand placement and RLE position    Ambulation/Gait Ambulation/Gait assistance: Min assist, Min guard Gait Distance (Feet): 55 Feet Assistive device: Rolling walker (2 wheels) Gait Pattern/deviations: Step-to pattern       General Gait Details: verbal cues for sequence, RW position and posture  Stairs            Wheelchair Mobility    Modified Rankin (Stroke Patients Only)       Balance                                             Pertinent Vitals/Pain Pain Assessment Pain Assessment: 0-10 Pain Score: 6  Pain Location: right knee Pain Descriptors / Indicators: Discomfort, Sore Pain Intervention(s): Limited activity within patient's tolerance, Monitored during session, Premedicated before session, Repositioned    Home Living Family/patient expects to be discharged to:: Private residence Living Arrangements: Spouse/significant other;Children Available Help at Discharge: Family;Available 24 hours/day Type of Home: House Home Access: Stairs to enter   Entrance Stairs-Number of Steps: 1 +1   Home Layout: Two level;Able to live on main level with bedroom/bathroom Home Equipment:  Cane - single point;Hand held shower head;Grab bars - tub/shower;Shower Land (2 wheels)      Prior Function Prior Level of Function : Independent/Modified Independent                     Hand Dominance        Extremity/Trunk Assessment   Upper Extremity Assessment Upper Extremity Assessment: Overall WFL for tasks  assessed    Lower Extremity Assessment Lower Extremity Assessment: RLE deficits/detail RLE Deficits / Details: ankle WFL, knee extension and hip flexion 2/5, limited by post op pain and weakness       Communication   Communication: No difficulties  Cognition Arousal/Alertness: Awake/alert Behavior During Therapy: WFL for tasks assessed/performed Overall Cognitive Status: Within Functional Limits for tasks assessed                                          General Comments      Exercises Total Joint Exercises Ankle Circles/Pumps: AROM, Both, 10 reps   Assessment/Plan    PT Assessment Patient needs continued PT services  PT Problem List Decreased strength;Decreased range of motion;Decreased activity tolerance;Decreased knowledge of use of DME;Decreased mobility;Decreased knowledge of precautions;Pain       PT Treatment Interventions DME instruction;Functional mobility training;Gait training;Stair training;Therapeutic exercise;Patient/family education;Therapeutic activities    PT Goals (Current goals can be found in the Care Plan section)  Acute Rehab PT Goals PT Goal Formulation: With patient Time For Goal Achievement: 04/02/22 Potential to Achieve Goals: Good    Frequency 7X/week     Co-evaluation               AM-PAC PT "6 Clicks" Mobility  Outcome Measure Help needed turning from your back to your side while in a flat bed without using bedrails?: A Little Help needed moving from lying on your back to sitting on the side of a flat bed without using bedrails?: A Little Help needed moving to and from a bed to a chair (including a wheelchair)?: A Little Help needed standing up from a chair using your arms (e.g., wheelchair or bedside chair)?: A Little Help needed to walk in hospital room?: A Little Help needed climbing 3-5 steps with a railing? : A Lot 6 Click Score: 17    End of Session Equipment Utilized During Treatment: Gait belt;Right  knee immobilizer Activity Tolerance: Patient tolerated treatment well Patient left: with call bell/phone within reach;with chair alarm set;in chair Nurse Communication: Mobility status PT Visit Diagnosis: Other abnormalities of gait and mobility (R26.89);Muscle weakness (generalized) (M62.81);Difficulty in walking, not elsewhere classified (R26.2)    Time: [id number] PT Time Calculation (min) (ACUTE ONLY): 26 min   Charges:   PT Evaluation $PT Eval Low Complexity: 1 Low PT Treatments $Gait Training: 8-22 mins        Baxter Flattery, PT  Acute Rehab Dept [facility]) [phone]  [facility] Weekend Pager [facility] only)  [phone]  03/19/2022   [facility] 03/19/2022, 3:56 PM

## 2022-03-19 NOTE — Plan of Care (Signed)
  Problem: Activity: Goal: Ability to avoid complications of mobility impairment will improve Outcome: Progressing Goal: Range of joint motion will improve Outcome: Progressing   Problem: Nutritional: Goal: Maintenance of adequate nutrition will improve Outcome: Progressing Goal: Progress toward achieving an optimal weight will improve Outcome: Progressing   Problem: Safety: Goal: Ability to remain free from injury will improve Outcome: Progressing   Problem: Pain Managment: Goal: General experience of comfort will improve Outcome: Progressing

## 2022-03-19 NOTE — Discharge Instructions (Addendum)
Information on my medicine - XARELTO (Rivaroxaban)  This medication education was reviewed with me or my healthcare representative as part of my discharge preparation.  T Why was Xarelto prescribed for you? Xarelto was prescribed for you to reduce the risk of blood clots forming after orthopedic surgery. The medical term for these abnormal blood clots is venous thromboembolism (VTE).  What do you need to know about xarelto ? Take your Xarelto ONCE DAILY at the same time every day. You may take it either with or without food.  If you have difficulty swallowing the tablet whole, you may crush it and mix in applesauce just prior to taking your dose.  Take Xarelto exactly as prescribed by your doctor and DO NOT stop taking Xarelto without talking to the doctor who prescribed the medication.  Stopping without other VTE prevention medication to take the place of Xarelto may increase your risk of developing a clot.  After discharge, you should have regular check-up appointments with your healthcare provider that is prescribing your Xarelto.    What do you do if you miss a dose? If you miss a dose, take it as soon as you remember on the same day then continue your regularly scheduled once daily regimen the next day. Do not take two doses of Xarelto on the same day.   Important Safety Information A possible side effect of Xarelto is bleeding. You should call your healthcare provider right away if you experience any of the following: Bleeding from an injury or your nose that does not stop. Unusual colored urine (red or dark brown) or unusual colored stools (red or black). Unusual bruising for unknown reasons. A serious fall or if you hit your head (even if there is no bleeding).  Some medicines may interact with Xarelto and might increase your risk of bleeding while on Xarelto. To help avoid this, consult your healthcare provider or pharmacist prior to using any new prescription or  non-prescription medications, including herbals, vitamins, non-steroidal anti-inflammatory drugs (NSAIDs) and supplements.  This website has more information on Xarelto: [email or web address]-benson.[email or web address]    Gaynelle Arabian, MD Total Joint Specialist [facility] [place] [address], [address] [place],  [address] [phone]  TOTAL KNEE REPLACEMENT POSTOPERATIVE DIRECTIONS    Knee Rehabilitation, Guidelines Following Surgery  Results after knee surgery are often greatly improved when you follow the exercise, range of motion and muscle strengthening exercises prescribed by your doctor. Safety measures are also important to protect the knee from further injury. If any of these exercises cause you to have increased pain or swelling in your knee joint, decrease the amount until you are comfortable again and slowly increase them. If you have problems or questions, call your caregiver or physical therapist for advice.   BLOOD CLOT PREVENTION Take a 10 mg Xarelto once a day for three weeks following surgery.  You may resume your vitamins/supplements once you have discontinued the Xarelto. Do not take any NSAIDs (Advil, Aleve, Ibuprofen, Meloxicam, etc.) until you have discontinued the Xarelto.   HOME CARE INSTRUCTIONS  Remove items at home which could result in a fall. This includes throw rugs or furniture in walking pathways.  ICE to the affected knee as much as tolerated. Icing helps control swelling. If the swelling is well controlled you will be more comfortable and rehab easier. Continue to use ice on the knee for pain and swelling from surgery. You may notice swelling that will progress down to the foot and ankle. This is normal after  surgery. Elevate the leg when you are not up walking on it.    Continue to use the breathing machine which will help keep your temperature down. It is common for your temperature to cycle up and down following surgery, especially at night when you are not up  moving around and exerting yourself. The breathing machine keeps your lungs expanded and your temperature down. Do not place pillow under the operative knee, focus on keeping the knee straight while resting  DIET You may resume your previous home diet once you are discharged from the hospital.  DRESSING / WOUND CARE / SHOWERING Keep your bulky bandage on for 2 days. On the third post-operative day you may remove the Ace bandage and gauze. There is a waterproof adhesive bandage on your skin which will stay in place until your first follow-up appointment. Once you remove this you will not need to place another bandage You may begin showering 3 days following surgery, but do not submerge the incision under water.  ACTIVITY For the first 5 days, the key is rest and control of pain and swelling Do your home exercises twice a day starting on post-operative day 3. On the days you go to physical therapy, just do the home exercises once that day. You should rest, ice and elevate the leg for 50 minutes out of every hour. Get up and walk/stretch for 10 minutes per hour. After 5 days you can increase your activity slowly as tolerated. Walk with your walker as instructed. Use the walker until you are comfortable transitioning to a cane. Walk with the cane in the opposite hand of the operative leg. You may discontinue the cane once you are comfortable and walking steadily. Avoid periods of inactivity such as sitting longer than an hour when not asleep. This helps prevent blood clots.  You may discontinue the knee immobilizer once you are able to perform a straight leg raise while lying down. You may resume a sexual relationship in one month or when given the OK by your doctor.  You may return to work once you are cleared by your doctor.  Do not drive a car for 6 weeks or until released by your surgeon.  Do not drive while taking narcotics.  TED HOSE STOCKINGS Wear the elastic stockings on both legs for  three weeks following surgery during the day. You may remove them at night for sleeping.  WEIGHT BEARING Weight bearing as tolerated with assist device (walker, cane, etc) as directed, use it as long as suggested by your surgeon or therapist, typically at least 4-6 weeks.  POSTOPERATIVE CONSTIPATION PROTOCOL Constipation - defined medically as fewer than three stools per week and severe constipation as less than one stool per week.  One of the most common issues patients have following surgery is constipation.  Even if you have a regular bowel pattern at home, your normal regimen is likely to be disrupted due to multiple reasons following surgery.  Combination of anesthesia, postoperative narcotics, change in appetite and fluid intake all can affect your bowels.  In order to avoid complications following surgery, here are some recommendations in order to help you during your recovery period.  Colace (docusate) - Pick up an over-the-counter form of Colace or another stool softener and take twice a day as long as you are requiring postoperative pain medications.  Take with a full glass of water daily.  If you experience loose stools or diarrhea, hold the colace until you stool forms back up.  If your symptoms do not get better within 1 week or if they get worse, check with your doctor. Dulcolax (bisacodyl) - Pick up over-the-counter and take as directed by the product packaging as needed to assist with the movement of your bowels.  Take with a full glass of water.  Use this product as needed if not relieved by Colace only.  MiraLax (polyethylene glycol) - Pick up over-the-counter to have on hand. MiraLax is a solution that will increase the amount of water in your bowels to assist with bowel movements.  Take as directed and can mix with a glass of water, juice, soda, coffee, or tea. Take if you go more than two days without a movement. Do not use MiraLax more than once per day. Call your doctor if you are  still constipated or irregular after using this medication for 7 days in a row.  If you continue to have problems with postoperative constipation, please contact the office for further assistance and recommendations.  If you experience "the worst abdominal pain ever" or develop nausea or vomiting, please contact the office immediatly for further recommendations for treatment.  ITCHING If you experience itching with your medications, try taking only a single pain pill, or even half a pain pill at a time.  You can also use Benadryl over the counter for itching or also to help with sleep.   MEDICATIONS See your medication summary on the "After Visit Summary" that the nursing staff will review with you prior to discharge.  You may have some home medications which will be placed on hold until you complete the course of blood thinner medication.  It is important for you to complete the blood thinner medication as prescribed by your surgeon.  Continue your approved medications as instructed at time of discharge.  PRECAUTIONS If you experience chest pain or shortness of breath - call 911 immediately for transfer to the hospital emergency department.  If you develop a fever greater that 101 F, purulent drainage from wound, increased redness or drainage from wound, foul odor from the wound/dressing, or calf pain - CONTACT YOUR SURGEON.                                                   FOLLOW-UP APPOINTMENTS Make sure you keep all of your appointments after your operation with your surgeon and caregivers. You should call the office at the above phone number and make an appointment for approximately two weeks after the date of your surgery or on the date instructed by your surgeon outlined in the "After Visit Summary".  RANGE OF MOTION AND STRENGTHENING EXERCISES  Rehabilitation of the knee is important following a knee injury or an operation. After just a few days of immobilization, the muscles of the thigh  which control the knee become weakened and shrink (atrophy). Knee exercises are designed to build up the tone and strength of the thigh muscles and to improve knee motion. Often times heat used for twenty to thirty minutes before working out will loosen up your tissues and help with improving the range of motion but do not use heat for the first two weeks following surgery. These exercises can be done on a training (exercise) mat, on the floor, on a table or on a bed. Use what ever works the best and is most comfortable  for you Knee exercises include:  Leg Lifts - While your knee is still immobilized in a splint or cast, you can do straight leg raises. Lift the leg to 60 degrees, hold for 3 sec, and slowly lower the leg. Repeat 10-20 times 2-3 times daily. Perform this exercise against resistance later as your knee gets better.  Quad and Hamstring Sets - Tighten up the muscle on the front of the thigh (Quad) and hold for 5-10 sec. Repeat this 10-20 times hourly. Hamstring sets are done by pushing the foot backward against an object and holding for 5-10 sec. Repeat as with quad sets.  Leg Slides: Lying on your back, slowly slide your foot toward your buttocks, bending your knee up off the floor (only go as far as is comfortable). Then slowly slide your foot back down until your leg is flat on the floor again. Angel Wings: Lying on your back spread your legs to the side as far apart as you can without causing discomfort.  A rehabilitation program following serious knee injuries can speed recovery and prevent re-injury in the future due to weakened muscles. Contact your doctor or a physical therapist for more information on knee rehabilitation.   POST-OPERATIVE OPIOID TAPER INSTRUCTIONS: It is important to wean off of your opioid medication as soon as possible. If you do not need pain medication after your surgery it is ok to stop day one. Opioids include: Codeine, Hydrocodone(Norco, Vicodin),  Oxycodone(Percocet, oxycontin) and hydromorphone amongst others.  Long term and even short term use of opiods can cause: Increased pain response Dependence Constipation Depression Respiratory depression And more.  Withdrawal symptoms can include Flu like symptoms Nausea, vomiting And more Techniques to manage these symptoms Hydrate well Eat regular healthy meals Stay active Use relaxation techniques(deep breathing, meditating, yoga) Do Not substitute Alcohol to help with tapering If you have been on opioids for less than two weeks and do not have pain than it is ok to stop all together.  Plan to wean off of opioids This plan should start within one week post op of your joint replacement. Maintain the same interval or time between taking each dose and first decrease the dose.  Cut the total daily intake of opioids by one tablet each day Next start to increase the time between doses. The last dose that should be eliminated is the evening dose.   IF YOU ARE TRANSFERRED TO A SKILLED REHAB FACILITY If the patient is transferred to a skilled rehab facility following release from the hospital, a list of the current medications will be sent to the facility for the patient to continue.  When discharged from the skilled rehab facility, please have the facility set up the patient's [place] prior to being released. Also, the skilled facility will be responsible for providing the patient with their medications at time of release from the facility to include their pain medication, the muscle relaxants, and their blood thinner medication. If the patient is still at the rehab facility at time of the two week follow up appointment, the skilled rehab facility will also need to assist the patient in arranging follow up appointment in our office and any transportation needs.  MAKE SURE YOU:  Understand these instructions.  Get help right away if you are not doing well or get worse.    DENTAL ANTIBIOTICS:  In most cases prophylactic antibiotics for Dental procdeures after total joint surgery are not necessary.  Exceptions are as follows:  1. History  of prior total joint infection  2. Severely immunocompromised (Organ Transplant, cancer chemotherapy, Rheumatoid biologic meds such as Jeffers)  3. Poorly controlled diabetes (A1C &gt; 8.0, blood glucose over 200)  If you have one of these conditions, contact your surgeon for an antibiotic prescription, prior to your dental procedure.    Pick up stool softner and laxative for home use following surgery while on pain medications. Do not submerge incision under water. Please use good hand washing techniques while changing dressing each day. May shower starting three days after surgery. Please use a clean towel to pat the incision dry following showers. Continue to use ice for pain and swelling after surgery. Do not use any lotions or creams on the incision until instructed by your surgeon.

## 2022-03-19 NOTE — Transfer of Care (Signed)
Immediate Anesthesia Transfer of Care Note  Patient: Westly Pam  Procedure(s) Performed: TOTAL KNEE ARTHROPLASTY (Right: Knee)  Patient Location: PACU  Anesthesia Type:General  Level of Consciousness: awake, alert , oriented, and patient cooperative  Airway & Oxygen Therapy: Patient Spontanous Breathing and Patient connected to face mask oxygen  Post-op Assessment: Report given to RN and Post -op Vital signs reviewed and stable  Post vital signs: Reviewed and stable  Last Vitals:  Vitals Value Taken Time  BP 183/91 03/19/22 0903  Temp    Pulse 88 03/19/22 0910  Resp 17 03/19/22 0910  SpO2 100 % 03/19/22 0910  Vitals shown include unvalidated device data.  Last Pain:  Vitals:   03/19/22 0553  TempSrc:   PainSc: 0-No pain      Patients Stated Pain Goal: 4 ([phone])  Complications: No notable events documented.

## 2022-03-19 NOTE — Interval H&P Note (Signed)
History and Physical Interval Note:  03/19/2022 6:28 AM  Dawn Salas  has presented today for surgery, with the diagnosis of right knee osteoarthritis.  The various methods of treatment have been discussed with the patient and family. After consideration of risks, benefits and other options for treatment, the patient has consented to  Procedure(s): TOTAL KNEE ARTHROPLASTY (Right) as a surgical intervention.  The patient's history has been reviewed, patient examined, no change in status, stable for surgery.  I have reviewed the patient's chart and labs.  Questions were answered to the patient's satisfaction.     [name]

## 2022-03-19 NOTE — Anesthesia Procedure Notes (Signed)
Procedure Name: Intubation Date/Time: 03/19/2022 7:30 AM  Performed by: Garrel Ridgel, CRNAPre-anesthesia Checklist: Patient identified, Emergency Drugs available, Suction available and Patient being monitored Patient Re-evaluated:Patient Re-evaluated prior to induction Oxygen Delivery Method: Circle system utilized Preoxygenation: Pre-oxygenation with 100% oxygen Induction Type: IV induction Ventilation: Mask ventilation without difficulty Laryngoscope Size: Mac and 3 Grade View: Grade II Tube type: Oral Tube size: 7.0 mm Number of attempts: 1 Airway Equipment and Method: Stylet Placement Confirmation: ETT inserted through vocal cords under direct vision, positive ETCO2 and breath sounds checked- equal and bilateral Secured at: 22 cm Tube secured with: Tape Dental Injury: Teeth and Oropharynx as per pre-operative assessment

## 2022-03-19 NOTE — Progress Notes (Signed)
Orthopedic Tech Progress Note Patient Details:  Dawn Salas 03-22-1940 [id number]  CPM Right Knee CPM Right Knee: Off Right Knee Flexion (Degrees): 40 Right Knee Extension (Degrees): 10  Post Interventions Patient Tolerated: Well Patient stated they were removed from the CPM by 3W staff around 10:30 this morning, but has requested to not be placed back into the CPM until after their therapy session. [name] 03/19/2022, 1:12 PM

## 2022-03-19 NOTE — Progress Notes (Signed)
Orthopedic Tech Progress Note Patient Details:  Dawn Salas 10/14/1940 [id number]  CPM Right Knee CPM Right Knee: On Right Knee Flexion (Degrees): 40 Right Knee Extension (Degrees): 10  Post Interventions Patient Tolerated: Well  [name] 03/19/2022, 9:19 AM

## 2022-03-20 ENCOUNTER — Encounter (HOSPITAL_COMMUNITY): Payer: Self-pay | Admitting: Orthopedic Surgery

## 2022-03-20 DIAGNOSIS — M1711 Unilateral primary osteoarthritis, right knee: Secondary | ICD-10-CM | POA: Diagnosis not present

## 2022-03-20 LAB — CBC
HCT: 28.9 % — ABNORMAL LOW (ref 36.0–46.0)
Hemoglobin: 9.4 g/dL — ABNORMAL LOW (ref 12.0–15.0)
MCH: 26.8 pg (ref 26.0–34.0)
MCHC: 32.5 g/dL (ref 30.0–36.0)
MCV: 82.3 fL (ref 80.0–100.0)
Platelets: 134 10*3/uL — ABNORMAL LOW (ref 150–400)
RBC: 3.51 MIL/uL — ABNORMAL LOW (ref 3.87–5.11)
RDW: 14.8 % (ref 11.5–15.5)
WBC: 7.5 10*3/uL (ref 4.0–10.5)
nRBC: 0 % (ref 0.0–0.2)

## 2022-03-20 LAB — BASIC METABOLIC PANEL
Anion gap: 7 (ref 5–15)
BUN: 15 mg/dL (ref 8–23)
CO2: 22 mmol/L (ref 22–32)
Calcium: 8 mg/dL — ABNORMAL LOW (ref 8.9–10.3)
Chloride: 105 mmol/L (ref 98–111)
Creatinine, Ser: 1.08 mg/dL — ABNORMAL HIGH (ref 0.44–1.00)
GFR, Estimated: 52 mL/min — ABNORMAL LOW (ref >60)
Glucose, Bld: 160 mg/dL — ABNORMAL HIGH (ref 70–99)
Potassium: 3.8 mmol/L (ref 3.5–5.1)
Sodium: 134 mmol/L — ABNORMAL LOW (ref 135–145)

## 2022-03-20 LAB — GLUCOSE, CAPILLARY
Glucose-Capillary: 124 mg/dL — ABNORMAL HIGH (ref 70–99)
Glucose-Capillary: 116 mg/dL — ABNORMAL HIGH (ref 70–99)

## 2022-03-20 MED ORDER — RIVAROXABAN 10 MG PO TABS: 10.0000 mg | ORAL_TABLET | Freq: Every day | ORAL | 0 refills | Status: AC

## 2022-03-20 MED ORDER — TRAMADOL HCL 50 MG PO TABS: 50.0000 mg | ORAL_TABLET | Freq: Four times a day (QID) | ORAL | 0 refills | Status: DC | PRN

## 2022-03-20 MED ORDER — OXYCODONE HCL 5 MG PO TABS: 5.0000 mg | ORAL_TABLET | Freq: Four times a day (QID) | ORAL | 0 refills | Status: DC | PRN

## 2022-03-20 MED ORDER — METHOCARBAMOL 500 MG PO TABS: 500.0000 mg | ORAL_TABLET | Freq: Four times a day (QID) | ORAL | 0 refills | Status: DC | PRN

## 2022-03-20 NOTE — Progress Notes (Signed)
Subjective: 1 Day Post-Op Procedure(s) (LRB): TOTAL KNEE ARTHROPLASTY (Right) Patient seen in rounds by Dr. Wynelle Link. Patient is well, and has had no acute complaints or problems. Denies SOB or chest pain. Denies calf pain. Voiding without difficulty. Patient reports pain as mild. Worked with physical therapy yesterday and ambulated 55'. We will continue physical therapy today.  Objective: Vital signs in last 24 hours: Temp:  [97.5 F (36.4 C)-98.5 F (36.9 C)] 98.4 F (36.9 C) (02/27 0607) Pulse Rate:  [78-98] 86 (02/27 0607) Resp:  [13-19] 16 (02/27 0607) BP: (136-193)/(65-99) 136/65 (02/27 0607) SpO2:  [94 %-100 %] 99 % (02/27 0607)  Intake/Output from previous day:  Intake/Output Summary (Last 24 hours) at 03/20/2022 0725 Last data filed at 03/19/2022 2200 Gross per 24 hour  Intake 2132.22 ml  Output 230 ml  Net 1902.22 ml     Intake/Output this shift: No intake/output data recorded.  Labs: Recent Labs    03/20/22 0304  HGB 9.4*   Recent Labs    03/20/22 0304  WBC 7.5  RBC 3.51*  HCT 28.9*  PLT 134*   Recent Labs    03/20/22 0304  NA 134*  K 3.8  CL 105  CO2 22  BUN 15  CREATININE 1.08*  GLUCOSE 160*  CALCIUM 8.0*   No results for input(s): "LABPT", "INR" in the last 72 hours.  Exam: General - Patient is Alert and Oriented Extremity - Neurologically intact Neurovascular intact Sensation intact distally Dorsiflexion/Plantar flexion intact Dressing - dressing C/D/I Motor Function - intact, moving foot and toes well on exam.  Past Medical History:  Diagnosis Date   Allergy    Anxiety    Arthritis    Asthma    patient denies    Cataract    bilateral lens implants    Colon polyps    Depression    Diabetes mellitus    Type two   Diverticulitis    Double vision 2019   left eye   Family history of adverse reaction to anesthesia    daughter- N/V    Family history of ovarian cancer    Family history of pancreatic cancer    Frequency  of urination    GERD (gastroesophageal reflux disease)    History of hiatal hernia    Hyperglycemia    Hyperlipidemia    Hypertension    Hyperthyroidism    PT HAD BIPOSY -NEGATIVE.Marland Kitchen AND NOW JUST GETS CHECKED EACH YEAR .Marland Kitchen 2013 LAST TEST   SEES DR. [name].    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 DOES NOT USE C PAP   Palpitations    Peptic ulcer disease    Pneumonia    "walking pneumonia" hx of   Seizures ([place]) 2015   one siezure with brain surgery none since 2015    Sleep apnea    no CPAP   Urinary tract infection 03/2020   Vertigo     Assessment/Plan: 1 Day Post-Op Procedure(s) (LRB): TOTAL KNEE ARTHROPLASTY (Right) Principal Problem:   OA (osteoarthritis) of knee  Estimated body mass index is 38.62 kg/m as calculated from the following:   Height as of this encounter: '5\' 4"'$  (1.626 m).   Weight as of this encounter: 102.1 kg. Advance diet Up with therapy D/C IV fluids  Patient's anticipated LOS is less than 2 midnights, meeting these requirements: - Lives within 1 hour of care - Has a competent adult at  home to recover with post-op  - NO history of  - Chronic pain requiring opiods  - Coronary Artery Disease  - Heart failure  - Heart attack  - Stroke  - DVT/VTE  - Cardiac arrhythmia  - Respiratory Failure/COPD  - Renal failure  - Anemia  - Advanced Liver disease  DVT Prophylaxis - Xarelto Weight bearing as tolerated.  Continue physical therapy. Expected discharge home today pending progress and if meeting patient goals. Scheduled for [facility]. Follow-up in clinic in 2 weeks.  The PDMP database was reviewed today prior to any opioid medications being prescribed to this patient.  R. Jaynie Bream, PA-C Orthopedic Surgery [phone] 03/20/2022, 7:25 AM \

## 2022-03-20 NOTE — Anesthesia Postprocedure Evaluation (Signed)
Anesthesia Post Note  Patient: Dawn Salas  Procedure(s) Performed: TOTAL KNEE ARTHROPLASTY (Right: Knee)     Patient location during evaluation: PACU Anesthesia Type: Regional and General Level of consciousness: awake and alert Pain management: pain level controlled Vital Signs Assessment: post-procedure vital signs reviewed and stable Respiratory status: spontaneous breathing, nonlabored ventilation, respiratory function stable and patient connected to nasal cannula oxygen Cardiovascular status: blood pressure returned to baseline and stable Postop Assessment: no apparent nausea or vomiting Anesthetic complications: no   No notable events documented.  Last Vitals:  Vitals:   03/20/22 0905 03/20/22 1216  BP: (!) 130/59 (!) 160/77  Pulse: 88 88  Resp: 16 18  Temp: 36.9 C 36.9 C  SpO2: 100% 98%    Last Pain:  Vitals:   03/20/22 1344  TempSrc:   PainSc: 3    Pain Goal: Patients Stated Pain Goal: 3 (03/20/22 1300)                 Daxx Tiggs

## 2022-03-20 NOTE — TOC Transition Note (Signed)
Transition of Care [facility]) - CM/SW Discharge Note   Patient Details  Name: Dawn Salas MRN: [id number] Date of Birth: 1940-02-22  [facility] CM/SW Contact:  Lennart Pall, LCSW Phone Number: 03/20/2022, 9:38 AM   Clinical Narrative:    Met with pt and confirming she has all needed DME at home.  OPPT already set up with [facility]).  No TOC needs.   Final next level of care: OP Rehab Barriers to Discharge: No Barriers Identified   Patient Goals and CMS Choice      Discharge Placement                         Discharge Plan and Services Additional resources added to the After Visit Summary for                  DME Arranged: N/A DME Agency: NA                  Social Determinants of Health (SDOH) Interventions SDOH Screenings   Food Insecurity: No Food Insecurity (03/19/2022)  Housing: Low Risk  (03/19/2022)  Transportation Needs: No Transportation Needs (03/19/2022)  Utilities: Not At Risk (03/19/2022)  Alcohol Screen: Low Risk  (09/23/2020)  Depression (PHQ2-9): Low Risk  (11/13/2021)  Recent Concern: Depression (PHQ2-9) - Medium Risk (10/06/2021)  Financial Resource Strain: Low Risk  (11/13/2021)  Physical Activity: Sufficiently Active (11/13/2021)  Social Connections: Socially Integrated (09/23/2020)  Stress: No Stress Concern Present (11/13/2021)  Tobacco Use: Low Risk  (03/19/2022)     Readmission Risk Interventions     No data to display

## 2022-03-20 NOTE — Progress Notes (Signed)
Physical Therapy Treatment Patient Details Name: Dawn Salas MRN: [id number] DOB: [birth year] Today's Date: 03/20/2022   History of Present Illness 82 yo female s/p R TKA on 03/19/22.  PMH: ACDF, arthritis, HTN, depression, craniotomy, meningioma x2, lumbar surgery, cervical sugery.    PT Comments    Pt progressing well this session. Will see again this pm to review stairs and pt will likely be ready for d/c later today   Recommendations for follow up therapy are one component of a multi-disciplinary discharge planning process, led by the attending physician.  Recommendations may be updated based on patient status, additional functional criteria and insurance authorization.  Follow Up Recommendations  Follow physician's recommendations for discharge plan and follow up therapies     Assistance Recommended at Discharge Intermittent Supervision/Assistance  Patient can return home with the following A little help with walking and/or transfers;A little help with bathing/dressing/bathroom;Help with stairs or ramp for entrance;Assist for transportation;Assistance with cooking/housework   Equipment Recommendations  None recommended by PT    Recommendations for Other Services       Precautions / Restrictions Precautions Precautions: Fall;Knee Restrictions Weight Bearing Restrictions: No Other Position/Activity Restrictions: WBAT     Mobility  Bed Mobility Overal bed mobility: Needs Assistance Bed Mobility: Sit to Supine     Supine to sit: Min guard     General bed mobility comments: able to  self assist and complete with incr time, KI in place    Transfers Overall transfer level: Needs assistance Equipment used: Rolling walker (2 wheels) Transfers: Sit to/from Stand Sit to Stand: Min guard           General transfer comment: cues for hand placement and RLE position    Ambulation/Gait Ambulation/Gait assistance: Min guard, Supervision Gait Distance (Feet): 140  Feet Assistive device: Rolling walker (2 wheels) Gait Pattern/deviations: Step-to pattern, Step-through pattern, Decreased stride length, Decreased stance time - right       General Gait Details: verbal cues for sequence, RW position and posture   Stairs             Wheelchair Mobility    Modified Rankin (Stroke Patients Only)       Balance                                            Cognition Arousal/Alertness: Awake/alert Behavior During Therapy: WFL for tasks assessed/performed Overall Cognitive Status: Within Functional Limits for tasks assessed                                          Exercises Total Joint Exercises Ankle Circles/Pumps: AROM, Both, 10 reps Heel Slides: AAROM, Right, 10 reps Straight Leg Raises: Right, 10 reps, AAROM (improved quad activation)    General Comments        Pertinent Vitals/Pain Pain Assessment Pain Assessment: 0-10 Pain Score: 3  Pain Location: right knee Pain Descriptors / Indicators: Discomfort, Sore Pain Intervention(s): Limited activity within patient's tolerance, Monitored during session, Premedicated before session, Repositioned, Ice applied    Home Living                          Prior Function            PT Goals (  current goals can now be found in the care plan section) Acute Rehab PT Goals PT Goal Formulation: With patient Time For Goal Achievement: 04/02/22 Potential to Achieve Goals: Good Progress towards PT goals: Progressing toward goals    Frequency    7X/week      PT Plan Current plan remains appropriate    Co-evaluation              AM-PAC PT "6 Clicks" Mobility   Outcome Measure  Help needed turning from your back to your side while in a flat bed without using bedrails?: A Little Help needed moving from lying on your back to sitting on the side of a flat bed without using bedrails?: A Little Help needed moving to and from a bed to a  chair (including a wheelchair)?: A Little Help needed standing up from a chair using your arms (e.g., wheelchair or bedside chair)?: A Little Help needed to walk in hospital room?: A Little Help needed climbing 3-5 steps with a railing? : A Little 6 Click Score: 18    End of Session Equipment Utilized During Treatment: Gait belt;Right knee immobilizer Activity Tolerance: Patient tolerated treatment well     PT Visit Diagnosis: Other abnormalities of gait and mobility (R26.89);Muscle weakness (generalized) (M62.81);Difficulty in walking, not elsewhere classified (R26.2)     Time: 1019-1040 PT Time Calculation (min) (ACUTE ONLY): 21 min  Charges:  $Gait Training: 8-22 mins                     Baxter Flattery, PT  Acute Rehab Dept [facility]) [phone]  [facility] Weekend Pager [facility] only)  [phone]  03/20/2022    [facility] 03/20/2022, 10:51 AM

## 2022-03-20 NOTE — Plan of Care (Signed)
Plan of care reviewed and discussed. 

## 2022-03-20 NOTE — Progress Notes (Signed)
Physical Therapy Treatment Patient Details Name: Dawn Salas MRN: [id number] DOB: 21-Apr-1940 Today's Date: 03/20/2022   History of Present Illness 82 yo female s/p R TKA on 03/19/22.  PMH: ACDF, arthritis, HTN, depression, craniotomy, meningioma x2, lumbar surgery, cervical sugery.    PT Comments    Progressing well, meeting PT goals; pt feels ready to d/c, has 24/7 family assist at home dtrs/son-in-law   Recommendations for follow up therapy are one component of a multi-disciplinary discharge planning process, led by the attending physician.  Recommendations may be updated based on patient status, additional functional criteria and insurance authorization.  Follow Up Recommendations  Follow physician's recommendations for discharge plan and follow up therapies     Assistance Recommended at Discharge Intermittent Supervision/Assistance  Patient can return home with the following A little help with walking and/or transfers;A little help with bathing/dressing/bathroom;Help with stairs or ramp for entrance;Assist for transportation;Assistance with cooking/housework   Equipment Recommendations  None recommended by PT    Recommendations for Other Services       Precautions / Restrictions Precautions Precautions: Fall;Knee Restrictions Weight Bearing Restrictions: No Other Position/Activity Restrictions: WBAT     Mobility  Bed Mobility Overal bed mobility: Needs Assistance Bed Mobility: Supine to Sit     Supine to sit: Min guard     General bed mobility comments: able to  self assist and complete with incr time, KI in place    Transfers Overall transfer level: Needs assistance Equipment used: Rolling walker (2 wheels) Transfers: Sit to/from Stand Sit to Stand: Min guard           General transfer comment: cues for hand placement and RLE position    Ambulation/Gait Ambulation/Gait assistance: Min guard, Supervision Gait Distance (Feet): 80 Feet Assistive  device: Rolling walker (2 wheels) Gait Pattern/deviations: Step-to pattern, Step-through pattern, Decreased stride length, Decreased stance time - right       General Gait Details: verbal cues for sequence, RW position and posture   Stairs Stairs: Yes Stairs assistance: Min guard Stair Management: Step to pattern, Backwards, Forwards, With walker Number of Stairs: 1 (x2) General stair comments: cues for sequence and technique, good stabiilty, no knee buckling; reviewed backward and forward with improved pain control with  backward technique; handout given and reviewed   Wheelchair Mobility    Modified Rankin (Stroke Patients Only)       Balance                                            Cognition Arousal/Alertness: Awake/alert Behavior During Therapy: WFL for tasks assessed/performed Overall Cognitive Status: Within Functional Limits for tasks assessed                                          Exercises Total Joint Exercises Ankle Circles/Pumps: AROM, Both, 10 reps Heel Slides: AAROM, Right, 10 reps Straight Leg Raises:  (improved quad activation)    General Comments        Pertinent Vitals/Pain Pain Assessment Pain Assessment: 0-10 Pain Score: 4  Pain Location: right knee Pain Descriptors / Indicators: Discomfort, Sore Pain Intervention(s): Limited activity within patient's tolerance, Monitored during session, Premedicated before session, Repositioned, Ice applied    Home Living  Prior Function            PT Goals (current goals can now be found in the care plan section) Acute Rehab PT Goals PT Goal Formulation: With patient Time For Goal Achievement: 04/02/22 Potential to Achieve Goals: Good Progress towards PT goals: Progressing toward goals    Frequency    7X/week      PT Plan Current plan remains appropriate    Co-evaluation              AM-PAC PT "6 Clicks"  Mobility   Outcome Measure  Help needed turning from your back to your side while in a flat bed without using bedrails?: A Little Help needed moving from lying on your back to sitting on the side of a flat bed without using bedrails?: None Help needed moving to and from a bed to a chair (including a wheelchair)?: A Little Help needed standing up from a chair using your arms (e.g., wheelchair or bedside chair)?: None Help needed to walk in hospital room?: A Little Help needed climbing 3-5 steps with a railing? : A Little 6 Click Score: 20    End of Session Equipment Utilized During Treatment: Gait belt;Right knee immobilizer (educated KI don/doffing and padding at posterior lower leg) Activity Tolerance: Patient tolerated treatment well     PT Visit Diagnosis: Other abnormalities of gait and mobility (R26.89);Muscle weakness (generalized) (M62.81);Difficulty in walking, not elsewhere classified (R26.2)     Time: [id number] PT Time Calculation (min) (ACUTE ONLY): 25 min  Charges:  $Gait Training: 23-37 mins                     Baxter Flattery, PT  Acute Rehab Dept [facility]) [phone]  [facility] Weekend Pager [facility] only)  [phone]  03/20/2022    [facility] 03/20/2022, 2:49 PM

## 2022-03-22 ENCOUNTER — Other Ambulatory Visit: Payer: Self-pay

## 2022-03-22 ENCOUNTER — Ambulatory Visit: Payer: Medicare Other | Attending: Student

## 2022-03-22 DIAGNOSIS — M25561 Pain in right knee: Secondary | ICD-10-CM | POA: Insufficient documentation

## 2022-03-22 DIAGNOSIS — R600 Localized edema: Secondary | ICD-10-CM | POA: Diagnosis present

## 2022-03-22 DIAGNOSIS — M25661 Stiffness of right knee, not elsewhere classified: Secondary | ICD-10-CM | POA: Insufficient documentation

## 2022-03-22 NOTE — Therapy (Signed)
OUTPATIENT PHYSICAL THERAPY LOWER EXTREMITY EVALUATION   Patient Name: Dawn Salas MRN: [id number] DOB:November 06, 1940, 82 y.o., female Today's Date: 03/22/2022  END OF SESSION:  PT End of Session - 03/22/22 1111     Visit Number 1    Number of Visits 12    Date for PT Re-Evaluation 05/18/22    PT Start Time 1115    PT Stop Time 1206    PT Time Calculation (min) 51 min    Activity Tolerance Patient tolerated treatment well    Behavior During Therapy [facility] for tasks assessed/performed             Past Medical History:  Diagnosis Date   Allergy    Anxiety    Arthritis    Asthma    patient denies    Cataract    bilateral lens implants    Colon polyps    Depression    Diabetes mellitus    Type two   Diverticulitis    Double vision 2019   left eye   Family history of adverse reaction to anesthesia    daughter- N/V    Family history of ovarian cancer    Family history of pancreatic cancer    Frequency of urination    GERD (gastroesophageal reflux disease)    History of adverse reaction to anesthesia 03/19/2022   After regional block for TKA pt states she had uncontrollable frequent blinking and "zoned out" was unable to speak more than one or two words for about 10 minutes   History of hiatal hernia    Hyperglycemia    Hyperlipidemia    Hypertension    Hyperthyroidism    PT HAD BIPOSY -NEGATIVE.Marland Kitchen AND NOW JUST GETS CHECKED EACH YEAR .Marland Kitchen 2013 LAST TEST   SEES DR. [name].    Multiple meningiomas of spine and brain ([place])    Obstructive sleep apnea    STUDY AT [facility] DOES NOT USE C PAP   Palpitations    Peptic ulcer disease    Pneumonia    "walking pneumonia" hx of   Seizures ([place]) 2015   one siezure with brain surgery none since 2015    Sleep apnea    no CPAP   Urinary tract infection 03/2020   Vertigo    Past Surgical History:  Procedure Laterality Date   ABDOMINAL HYSTERECTOMY     ANTERIOR CERVICAL DECOMP/DISCECTOMY FUSION N/A 04/19/2021   Procedure:  Anterior Cervical Discectomy and Fusion with Interbody Prosthesis, Plates/Screws Cervical Three-Four/Cervical Four-Five REMoval CERVICAL PLATE;  Surgeon: Newman Pies, MD;  Location: [place];  Service: Neurosurgery;  Laterality: N/A;  3C   BACK SURGERY     L SPIN 2003   NECK 2004   BRAIN MENINGIOMA EXCISION  08/2008   X 2   C5-C6 neck fusion     COLONOSCOPY  05/05/2009   pt states multiple polyps removed in both colonoscopies she has had   CRANIOTOMY  08/13/2011   Procedure: CRANIOTOMY TUMOR EXCISION;  Surgeon: [name], MD;  Location: [facility];  Service: Neurosurgery;  Laterality: Bilateral;  Bifrontal craniotomy for tumor   DILATION AND CURETTAGE OF UTERUS     YEARS AGO   L4-L5 posterior la  2021   L3-L5 fusion   left foot plantar and hammertoe     right foot bunectomy     right knee arthroscopy     x 3   right shoulder rotator cuff     ROBOTIC ASSISTED BILATERAL SALPINGO OOPHERECTOMY N/A  08/25/2015   Procedure: XI ROBOTIC ASSISTED BILATERAL SALPINGO OOPHORECTOMY;  Surgeon: [name], MD;  [facility];  Service: Gynecology;  Laterality: N/A;   TONSILLECTOMY     TOTAL KNEE ARTHROPLASTY Right 03/19/2022   Procedure: TOTAL KNEE ARTHROPLASTY;  Surgeon: Gaynelle Arabian, MD;  [facility];  Service: Orthopedics;  Laterality: Right;   TUBAL LIGATION     WRIST SURGERY Right    Patient Active Problem List   Diagnosis Date Noted   OA (osteoarthritis) of knee 03/19/2022   Cervical spondylosis with myelopathy and radiculopathy 04/19/2021   Genetic testing 08/01/2020   Family history of ovarian cancer 07/14/2020   Colon polyps 07/14/2020   Family history of pancreatic cancer 07/14/2020   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   CKD (chronic kidney disease) stage 3, GFR 30-59 ml/min (HCC) 10/01/2017   History of normocytic normochromic anemia 11/25/2015   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   Postablative hypothyroidism  03/21/2015   Spondylolisthesis of lumbar region 12/23/2013   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   Hyperthyroidism 05/25/2013   Obesity (BMI 30-39.9) 12/30/2012   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[place]) 08/21/2011   Meningioma ([place]) 08/13/2011   DIZZINESS 11/02/2009   Type 2 diabetes mellitus, controlled ([place]) 05/20/2009   HYPOKALEMIA 05/02/2009   DEPRESSION 06/24/2008   GERD 06/24/2008   PEPTIC ULCER DISEASE 06/24/2008   TMJ SYNDROME 06/09/2008   Hyperlipemia 11/08/2006   OBSTRUCTIVE SLEEP APNEA 11/08/2006   Essential hypertension 11/08/2006   ALLERGIC RHINITIS 11/08/2006   VOCAL CORD DISORDER 11/08/2006   ASTHMA 11/08/2006   TACHYARRHYTHMIA 11/08/2006    PCP: Eulas Post, MD  REFERRING PROVIDER: Jearld Lesch, PA   REFERRING DIAG: Unilateral primary osteoarthritis, right knee   THERAPY DIAG:  Acute pain of right knee  Stiffness of right knee, not elsewhere classified  Localized edema  Rationale for Evaluation and Treatment: Rehabilitation  ONSET DATE: 03/19/22  SUBJECTIVE:   SUBJECTIVE STATEMENT: Patient reports that she had a right knee replacement on 03/19/22. She stayed in the hospital overnight and went home on 03/20/22. She has noticed that her knee primarily bothering her when she is moving. She was given a HEP by the PT in the hospital and she has been doing those exercises at home.   PERTINENT HISTORY: Hypertension, diabetes, history of seizures, osteoarthritis, chronic low back pain, depression, asthma, allergies, and anxiety PAIN:  Are you having pain? Yes: NPRS scale: 6-7/10 Pain location: anterior right knee Pain description: sore, stiff, and throbbing Aggravating factors: activity Relieving factors: ice and medication   PRECAUTIONS: None  WEIGHT BEARING RESTRICTIONS: No  FALLS:  Has patient fallen in last 6 months?  No, but she has lost her balance and fell up against her wall in the bathroom.  LIVING ENVIRONMENT: Lives with: lives  with their son and lives with their daughter Lives in: House/apartment Stairs: Yes: Internal: 13 steps; can reach both and External: 3 steps; can reach both Has following equipment at home: Walker - 2 wheeled  OCCUPATION: retired  PLOF: Independent  PATIENT GOALS: be able to walk with a cane, reduced pain , improved mobility, and return to swimming  NEXT MD VISIT: 04/03/22  OBJECTIVE:   PATIENT SURVEYS:  FOTO 26.12  COGNITION: Overall cognitive status: Within functional limits for tasks assessed     SENSATION: Patient reports intermittent tingling in her right foot since surgery, but none currently  EDEMA:  Circumferential: Right knee (joint line): 52.5 cm Left knee (joint line): 48 cm  PALPATION:  TTP: right quadriceps, hip adductors, and lateral joint line  LOWER EXTREMITY ROM:  Active ROM Right eval Left eval  Hip flexion    Hip extension    Hip abduction    Hip adduction    Hip internal rotation    Hip external rotation    Knee flexion 57/ 77 (PROM)  122  Knee extension 17 0  Ankle dorsiflexion    Ankle plantarflexion    Ankle inversion    Ankle eversion     (Blank rows = not tested)  LOWER EXTREMITY MMT: not tested due to surgical condition  LOWER EXTREMITY SPECIAL TESTS: not tested due to surgical condition  FUNCTIONAL TESTS:  Significant difficulty with sit to stand and stand to sit transfers while avoiding WB on RLE  GAIT: Assistive device utilized: Environmental consultant - 2 wheeled Level of assistance: Modified independence Comments: Step through pattern with decreased gait speed and stride length   TODAY'S TREATMENT:                                                                                                                              DATE:   Modalities  Date:  [place]aso: Knee, 34 degrees; low pressure, 15 mins, Pain and Edema  PATIENT EDUCATION:  Education details: HEP, healing, prognosis, plan of care, and goals for therapy Person educated:  Patient Education method: Explanation Education comprehension: verbalized understanding  HOME EXERCISE PROGRAM: Reviewed HEP provided by PT at the hospital  ASSESSMENT:  CLINICAL IMPRESSION: Patient is a 82 y.o. female who was seen today for physical therapy evaluation and treatment following a right total knee arthroplasty on 03/19/22. She presented with moderate pain severity and irritability with knee ROM aggravating her familiar pain. She exhibited increased right knee edema, but she exhibited no signs or symptoms of a DVT. Her HEP was reviewed and she was able to recall these interventions. Recommend that she continue with skilled physical therapy to address her remaining impairments to return to her prior level of function.    OBJECTIVE IMPAIRMENTS: Abnormal gait, decreased activity tolerance, decreased balance, decreased mobility, difficulty walking, decreased ROM, decreased strength, hypomobility, increased edema, impaired flexibility, impaired sensation, and pain.   ACTIVITY LIMITATIONS: carrying, lifting, bending, standing, squatting, sleeping, stairs, transfers, bed mobility, bathing, dressing, and locomotion level  PARTICIPATION LIMITATIONS: meal prep, cleaning, laundry, driving, shopping, community activity, occupation, yard work, and church  PERSONAL FACTORS: Transportation and 3+ comorbidities: Hypertension, diabetes, history of seizures, osteoarthritis, chronic low back pain, depression, asthma, allergies, and anxiety  are also affecting patient's functional outcome.   REHAB POTENTIAL: Good  CLINICAL DECISION MAKING: Evolving/moderate complexity  EVALUATION COMPLEXITY: Moderate   GOALS: Goals reviewed with patient? Yes  SHORT TERM GOALS: Target date: 04/12/22 Patient will be independent with her initial HEP. Baseline: Goal status: INITIAL  2.  Patient will be able to demonstrate at least 90 degrees of active right knee flexion for improved knee mobility. Baseline:   Goal status: INITIAL  3.  Patient will be able to demonstrate active right knee extension within 10 degrees of neutral for improved gait mechanics. Baseline:  Goal status: INITIAL  4.  Patient will be able to transfer from sitting to standing with equal weight distribution throughout her lower extremities for improved functional transfers. Baseline:  Goal status: INITIAL  LONG TERM GOALS: Target date: 05/03/22  Patient will be independent with her advanced HEP. Baseline:  Goal status: INITIAL  2.  Patient will be able to demonstrate at least 120 degrees of active right knee flexion for improved function navigating stairs. Baseline:  Goal status: INITIAL  3.  Patient will be able to demonstrate active right knee extension within 5 degrees of neutral for improved gait mechanics. Baseline:  Goal status: INITIAL  4.  Patient will be able to navigate at least 3 steps with a reciprocal pattern for improved function with household mobility. Baseline:  Goal status: INITIAL  5.  Patient will be able to safely ambulate with a cane or the least restrictive assistive device for improved mobility. Baseline:  Goal status: INITIAL  PLAN:  PT FREQUENCY: 2-3x/week  PT DURATION: 6 weeks  PLANNED INTERVENTIONS: Therapeutic exercises, Therapeutic activity, Neuromuscular re-education, Balance training, Gait training, Patient/Family education, Self Care, Joint mobilization, Stair training, Electrical stimulation, Cryotherapy, Moist heat, Vasopneumatic device, Manual therapy, and Re-evaluation  PLAN FOR NEXT SESSION: nustep, heel slides, quad sets, SLR, PROM, and modalities as needed   Darlin Coco, PT 03/22/2022, 5:13 PM

## 2022-03-28 ENCOUNTER — Ambulatory Visit: Payer: Medicare Other | Attending: Student | Admitting: Physical Therapy

## 2022-03-28 ENCOUNTER — Encounter: Payer: Self-pay | Admitting: Physical Therapy

## 2022-03-28 DIAGNOSIS — R600 Localized edema: Secondary | ICD-10-CM

## 2022-03-28 DIAGNOSIS — M25561 Pain in right knee: Secondary | ICD-10-CM

## 2022-03-28 DIAGNOSIS — M25661 Stiffness of right knee, not elsewhere classified: Secondary | ICD-10-CM

## 2022-03-28 NOTE — Therapy (Signed)
OUTPATIENT PHYSICAL THERAPY LOWER EXTREMITY EVALUATION   Patient Name: Dawn Salas MRN: [id number] DOB:1940-07-31, 82 y.o., female Today's Date: 03/28/2022  END OF SESSION:  PT End of Session - 03/28/22 1321     Visit Number 2    Number of Visits 12    Date for PT Re-Evaluation 05/18/22    PT Start Time 1307    PT Stop Time [id number]    PT Time Calculation (min) 48 min    Activity Tolerance Patient tolerated treatment well    Behavior During Therapy [facility] for tasks assessed/performed            Past Medical History:  Diagnosis Date   Allergy    Anxiety    Arthritis    Asthma    patient denies    Cataract    bilateral lens implants    Colon polyps    Depression    Diabetes mellitus    Type two   Diverticulitis    Double vision 2019   left eye   Family history of adverse reaction to anesthesia    daughter- N/V    Family history of ovarian cancer    Family history of pancreatic cancer    Frequency of urination    GERD (gastroesophageal reflux disease)    History of adverse reaction to anesthesia 03/19/2022   After regional block for TKA pt states she had uncontrollable frequent blinking and "zoned out" was unable to speak more than one or two words for about 10 minutes   History of hiatal hernia    Hyperglycemia    Hyperlipidemia    Hypertension    Hyperthyroidism    PT HAD BIPOSY -NEGATIVE.Marland Kitchen AND NOW JUST GETS CHECKED EACH YEAR .Marland Kitchen 2013 LAST TEST   SEES DR. [name].    Multiple meningiomas of spine and brain ([place])    Obstructive sleep apnea    [facility]UDY [facility] DOES NOT USE C PAP   Palpitations    Peptic ulcer disease    Pneumonia    "walking pneumonia" hx of   Seizures ([place]) 2015   one siezure with brain surgery none since 2015    Sleep apnea    no CPAP   Urinary tract infection 03/2020   Vertigo    Past Surgical History:  Procedure Laterality Date   ABDOMINAL HYSTERECTOMY     ANTERIOR CERVICAL DECOMP/DISCECTOMY FUSION N/A 04/19/2021   Procedure: Anterior  Cervical Discectomy and Fusion with Interbody Prosthesis, Plates/Screws Cervical Three-Four/Cervical Four-Five REMoval CERVICAL PLATE;  Surgeon: Newman Pies, MD;  Location: [place];  Service: Neurosurgery;  Laterality: N/A;  3C   BACK SURGERY     L SPIN 2003   NECK 2004   BRAIN MENINGIOMA EXCISION  08/2008   X 2   C5-C6 neck fusion     COLONOSCOPY  05/05/2009   pt states multiple polyps removed in both colonoscopies she has had   CRANIOTOMY  08/13/2011   Procedure: CRANIOTOMY TUMOR EXCISION;  Surgeon: [name], MD;  Location: [place] [facility];  Service: Neurosurgery;  Laterality: Bilateral;  Bifrontal craniotomy for tumor   DILATION AND CURETTAGE OF UTERUS     YEARS AGO   L4-L5 posterior la  2021   L3-L5 fusion   left foot plantar and hammertoe     right foot bunectomy     right knee arthroscopy     x 3   right shoulder rotator cuff     ROBOTIC ASSISTED BILATERAL SALPINGO OOPHERECTOMY N/A 08/25/2015  Procedure: XI ROBOTIC ASSISTED BILATERAL SALPINGO OOPHORECTOMY;  Surgeon: [name], MD;  Location[facility];  Service: Gynecology;  Laterality: N/A;   TONSILLECTOMY     TOTAL KNEE ARTHROPLASTY Right 03/19/2022   Procedure: TOTAL KNEE ARTHROPLASTY;  Surgeon: Gaynelle Arabian, MD;  Location: [facility];  Service: Orthopedics;  Laterality: Right;   TUBAL LIGATION     WRIST SURGERY Right    Patient Active Problem List   Diagnosis Date Noted   OA (osteoarthritis) of knee 03/19/2022   Cervical spondylosis with myelopathy and radiculopathy 04/19/2021   Genetic testing 08/01/2020   Family history of ovarian cancer 07/14/2020   Colon polyps 07/14/2020   Family history of pancreatic cancer 07/14/2020   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   CKD (chronic kidney disease) stage 3, GFR 30-59 ml/min (HCC) 10/01/2017   History of normocytic normochromic anemia 11/25/2015   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    Spondylolisthesis of lumbar region 12/23/2013   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   Hyperthyroidism 05/25/2013   Obesity (BMI 30-39.9) 12/30/2012   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[place]) 08/21/2011   Meningioma ([place]) 08/13/2011   DIZZINESS 11/02/2009   Type 2 diabetes mellitus, controlled ([place]) 05/20/2009   HYPOKALEMIA 05/02/2009   DEPRESSION 06/24/2008   GERD 06/24/2008   PEPTIC ULCER DISEASE 06/24/2008   TMJ SYNDROME 06/09/2008   Hyperlipemia 11/08/2006   OBSTRUCTIVE SLEEP APNEA 11/08/2006   Essential hypertension 11/08/2006   ALLERGIC RHINITIS 11/08/2006   VOCAL CORD DISORDER 11/08/2006   ASTHMA 11/08/2006   TACHYARRHYTHMIA 11/08/2006   PCP: Eulas Post, MD  REFERRING PROVIDER: Jearld Lesch, PA   REFERRING DIAG: Unilateral primary osteoarthritis, right knee   THERAPY DIAG:  Acute pain of right knee  Stiffness of right knee, not elsewhere classified  Localized edema  Rationale for Evaluation and Treatment: Rehabilitation  ONSET DATE: 03/19/22  SUBJECTIVE:   SUBJECTIVE STATEMENT: Patient reports a lot of pain today.  PERTINENT HISTORY: Hypertension, diabetes, history of seizures, osteoarthritis, chronic low back pain, depression, asthma, allergies, and anxiety PAIN:  Are you having pain? Yes: NPRS scale: 7/10 Pain location: anterior right knee Pain description: sore, stiff, and throbbing Aggravating factors: activity Relieving factors: ice and medication   PRECAUTIONS: None  PATIENT GOALS: be able to walk with a cane, reduced pain , improved mobility, and return to swimming  NEXT MD VISIT: 04/03/22  OBJECTIVE:   PATIENT SURVEYS:  FOTO 26.12   SENSATION: Patient reports intermittent tingling in her right foot since surgery, but none currently  EDEMA:  Circumferential: Right knee (joint line): 52.5 cm Left knee (joint line): 48 cm  PALPATION: TTP: right quadriceps, hip adductors, and lateral joint line  LOWER EXTREMITY  ROM:  Active ROM Right eval Left eval  Hip flexion    Hip extension    Hip abduction    Hip adduction    Hip internal rotation    Hip external rotation    Knee flexion 57/ 77 (PROM)  122  Knee extension 17 0  Ankle dorsiflexion    Ankle plantarflexion    Ankle inversion    Ankle eversion     (Blank rows = not tested)  LOWER EXTREMITY MMT: not tested due to surgical condition  LOWER EXTREMITY SPECIAL TESTS: not tested due to surgical condition  FUNCTIONAL TESTS:  Significant difficulty with sit to stand and stand to sit transfers while avoiding WB on RLE  GAIT: Assistive device utilized: Walker - 2 wheeled Level of assistance:  Modified independence Comments: Step through pattern with decreased gait speed and stride length   TODAY'S TREATMENT:                                                                                                                              DATE:            03/28/22   EXERCISE LOG  Exercise Repetitions and Resistance Comments  Nustep L4, seat 13 x15 min   Rockerboard X2 min   LAQ X15 reps   Heel slides X15 reps seated         Blank cell = exercise not performed today   Manual Therapy Soft Tissue Mobilization: RLE, edema massage to alleviate the increased edema of RLE    Modalities  Date: 03/28/22 Unattended Estim: Knee, IFC, 10 mins, Pain and Edema Vaso: Knee, Low, 10 mins, Pain and Edema  PATIENT EDUCATION:  Education details: HEP, healing, prognosis, plan of care, and goals for therapy Person educated: Patient Education method: Explanation Education comprehension: verbalized understanding  HOME EXERCISE PROGRAM: Reviewed HEP provided by PT at the hospital  ASSESSMENT:  CLINICAL IMPRESSION: Patient presented in clinic with reports of high level R knee pain and increased edema of RLE. R knee flexion noted in stance phase of gait with FWW. Patient able to tolerate light therex for ROM but deficient quad activation which may be  correlated to surgical procedure or excessive edema. Edema massage completed of RLE to assist with edema control. Normal modalities response noted following removal of the modalities.  OBJECTIVE IMPAIRMENTS: Abnormal gait, decreased activity tolerance, decreased balance, decreased mobility, difficulty walking, decreased ROM, decreased strength, hypomobility, increased edema, impaired flexibility, impaired sensation, and pain.   ACTIVITY LIMITATIONS: carrying, lifting, bending, standing, squatting, sleeping, stairs, transfers, bed mobility, bathing, dressing, and locomotion level  PARTICIPATION LIMITATIONS: meal prep, cleaning, laundry, driving, shopping, community activity, occupation, yard work, and church  PERSONAL FACTORS: Transportation and 3+ comorbidities: Hypertension, diabetes, history of seizures, osteoarthritis, chronic low back pain, depression, asthma, allergies, and anxiety  are also affecting patient's functional outcome.   REHAB POTENTIAL: Good  CLINICAL DECISION MAKING: Evolving/moderate complexity  EVALUATION COMPLEXITY: Moderate  GOALS: Goals reviewed with patient? Yes  SHORT TERM GOALS: Target date: 04/12/22 Patient will be independent with her initial HEP. Baseline: Goal status: INITIAL  2.  Patient will be able to demonstrate at least 90 degrees of active right knee flexion for improved knee mobility. Baseline:  Goal status: INITIAL  3.  Patient will be able to demonstrate active right knee extension within 10 degrees of neutral for improved gait mechanics. Baseline:  Goal status: INITIAL  4.  Patient will be able to transfer from sitting to standing with equal weight distribution throughout her lower extremities for improved functional transfers. Baseline:  Goal status: INITIAL  LONG TERM GOALS: Target date: 05/03/22  Patient will be independent with her advanced HEP. Baseline:  Goal status: INITIAL  2.  Patient will  be able to demonstrate at least 120  degrees of active right knee flexion for improved function navigating stairs. Baseline:  Goal status: INITIAL  3.  Patient will be able to demonstrate active right knee extension within 5 degrees of neutral for improved gait mechanics. Baseline:  Goal status: INITIAL  4.  Patient will be able to navigate at least 3 steps with a reciprocal pattern for improved function with household mobility. Baseline:  Goal status: INITIAL  5.  Patient will be able to safely ambulate with a cane or the least restrictive assistive device for improved mobility. Baseline:  Goal status: INITIAL  PLAN:  PT FREQUENCY: 2-3x/week  PT DURATION: 6 weeks  PLANNED INTERVENTIONS: Therapeutic exercises, Therapeutic activity, Neuromuscular re-education, Balance training, Gait training, Patient/Family education, Self Care, Joint mobilization, Stair training, Electrical stimulation, Cryotherapy, Moist heat, Vasopneumatic device, Manual therapy, and Re-evaluation  PLAN FOR NEXT SESSION: nustep, heel slides, quad sets, SLR, PROM, and modalities as needed  Standley Brooking, PTA 03/28/2022, 3:06 PM

## 2022-03-30 ENCOUNTER — Ambulatory Visit: Payer: Medicare Other

## 2022-03-30 DIAGNOSIS — M25561 Pain in right knee: Secondary | ICD-10-CM | POA: Diagnosis not present

## 2022-03-30 DIAGNOSIS — R600 Localized edema: Secondary | ICD-10-CM

## 2022-03-30 DIAGNOSIS — M25661 Stiffness of right knee, not elsewhere classified: Secondary | ICD-10-CM

## 2022-03-30 NOTE — Therapy (Signed)
OUTPATIENT PHYSICAL THERAPY LOWER EXTREMITY TREATMENT   Patient Name: Dawn Salas MRN: [id number] DOB:12-11-1940, 82 y.o., female Today's Date: 03/30/2022  END OF SESSION:  PT End of Session - 03/30/22 1136     Visit Number 3    Number of Visits 12    Date for PT Re-Evaluation 05/18/22    PT Start Time 1115    PT Stop Time 1209    PT Time Calculation (min) 54 min    Activity Tolerance Patient tolerated treatment well    Behavior During Therapy [facility] for tasks assessed/performed            Past Medical History:  Diagnosis Date   Allergy    Anxiety    Arthritis    Asthma    patient denies    Cataract    bilateral lens implants    Colon polyps    Depression    Diabetes mellitus    Type two   Diverticulitis    Double vision 2019   left eye   Family history of adverse reaction to anesthesia    daughter- N/V    Family history of ovarian cancer    Family history of pancreatic cancer    Frequency of urination    GERD (gastroesophageal reflux disease)    History of adverse reaction to anesthesia 03/19/2022   After regional block for TKA pt states she had uncontrollable frequent blinking and "zoned out" was unable to speak more than one or two words for about 10 minutes   History of hiatal hernia    Hyperglycemia    Hyperlipidemia    Hypertension    Hyperthyroidism    PT HAD BIPOSY -NEGATIVE.Marland Kitchen AND NOW JUST GETS CHECKED EACH YEAR .Marland Kitchen 2013 LAST TEST   SEES DR. [name].    Multiple meningiomas of spine and brain ([place])    Obstructive sleep apnea    [facility]UDY [facility] DOES NOT USE C PAP   Palpitations    Peptic ulcer disease    Pneumonia    "walking pneumonia" hx of   Seizures ([place]) 2015   one siezure with brain surgery none since 2015    Sleep apnea    no CPAP   Urinary tract infection 03/2020   Vertigo    Past Surgical History:  Procedure Laterality Date   ABDOMINAL HYSTERECTOMY     ANTERIOR CERVICAL DECOMP/DISCECTOMY FUSION N/A 04/19/2021   Procedure: Anterior  Cervical Discectomy and Fusion with Interbody Prosthesis, Plates/Screws Cervical Three-Four/Cervical Four-Five REMoval CERVICAL PLATE;  Surgeon: Newman Pies, MD;  Location: [place];  Service: Neurosurgery;  Laterality: N/A;  3C   BACK SURGERY     L SPIN 2003   NECK 2004   BRAIN MENINGIOMA EXCISION  08/2008   X 2   C5-C6 neck fusion     COLONOSCOPY  05/05/2009   pt states multiple polyps removed in both colonoscopies she has had   CRANIOTOMY  08/13/2011   Procedure: CRANIOTOMY TUMOR EXCISION;  Surgeon: [name], MD;  Location: [place] [facility];  Service: Neurosurgery;  Laterality: Bilateral;  Bifrontal craniotomy for tumor   DILATION AND CURETTAGE OF UTERUS     YEARS AGO   L4-L5 posterior la  2021   L3-L5 fusion   left foot plantar and hammertoe     right foot bunectomy     right knee arthroscopy     x 3   right shoulder rotator cuff     ROBOTIC ASSISTED BILATERAL SALPINGO OOPHERECTOMY N/A 08/25/2015  Procedure: XI ROBOTIC ASSISTED BILATERAL SALPINGO OOPHORECTOMY;  Surgeon: [name], MD;  [facility];  Service: Gynecology;  Laterality: N/A;   TONSILLECTOMY     TOTAL KNEE ARTHROPLASTY Right 03/19/2022   Procedure: TOTAL KNEE ARTHROPLASTY;  Surgeon: Gaynelle Arabian, MD;  [facility];  Service: Orthopedics;  Laterality: Right;   TUBAL LIGATION     WRIST SURGERY Right    Patient Active Problem List   Diagnosis Date Noted   OA (osteoarthritis) of knee 03/19/2022   Cervical spondylosis with myelopathy and radiculopathy 04/19/2021   Genetic testing 08/01/2020   Family history of ovarian cancer 07/14/2020   Colon polyps 07/14/2020   Family history of pancreatic cancer 07/14/2020   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   CKD (chronic kidney disease) stage 3, GFR 30-59 ml/min (HCC) 10/01/2017   History of normocytic normochromic anemia 11/25/2015   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    Spondylolisthesis of lumbar region 12/23/2013   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   Hyperthyroidism 05/25/2013   Obesity (BMI 30-39.9) 12/30/2012   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[place]) 08/21/2011   Meningioma ([place]) 08/13/2011   DIZZINESS 11/02/2009   Type 2 diabetes mellitus, controlled ([place]) 05/20/2009   HYPOKALEMIA 05/02/2009   DEPRESSION 06/24/2008   GERD 06/24/2008   PEPTIC ULCER DISEASE 06/24/2008   TMJ SYNDROME 06/09/2008   Hyperlipemia 11/08/2006   OBSTRUCTIVE SLEEP APNEA 11/08/2006   Essential hypertension 11/08/2006   ALLERGIC RHINITIS 11/08/2006   VOCAL CORD DISORDER 11/08/2006   ASTHMA 11/08/2006   TACHYARRHYTHMIA 11/08/2006   PCP: Eulas Post, MD  REFERRING PROVIDER: Jearld Lesch, PA   REFERRING DIAG: Unilateral primary osteoarthritis, right knee   THERAPY DIAG:  Acute pain of right knee  Stiffness of right knee, not elsewhere classified  Localized edema  Rationale for Evaluation and Treatment: Rehabilitation  ONSET DATE: 03/19/22  SUBJECTIVE:   SUBJECTIVE STATEMENT: Patient reports feeling better today.  PERTINENT HISTORY: Hypertension, diabetes, history of seizures, osteoarthritis, chronic low back pain, depression, asthma, allergies, and anxiety PAIN:  Are you having pain? Yes: NPRS scale: 4/10 Pain location: anterior right knee Pain description: sore, stiff, and throbbing Aggravating factors: activity Relieving factors: ice and medication   PRECAUTIONS: None  PATIENT GOALS: be able to walk with a cane, reduced pain , improved mobility, and return to swimming  NEXT MD VISIT: 04/03/22  OBJECTIVE:   PATIENT SURVEYS:  FOTO 26.12   SENSATION: Patient reports intermittent tingling in her right foot since surgery, but none currently  EDEMA:  Circumferential: Right knee (joint line): 52.5 cm Left knee (joint line): 48 cm  PALPATION: TTP: right quadriceps, hip adductors, and lateral joint line  LOWER EXTREMITY  ROM:  Active ROM Right eval Left eval  Hip flexion    Hip extension    Hip abduction    Hip adduction    Hip internal rotation    Hip external rotation    Knee flexion 57/ 77 (PROM)  122  Knee extension 17 0  Ankle dorsiflexion    Ankle plantarflexion    Ankle inversion    Ankle eversion     (Blank rows = not tested)  LOWER EXTREMITY MMT: not tested due to surgical condition  LOWER EXTREMITY SPECIAL TESTS: not tested due to surgical condition  FUNCTIONAL TESTS:  Significant difficulty with sit to stand and stand to sit transfers while avoiding WB on RLE  GAIT: Assistive device utilized: Walker - 2 wheeled Level of assistance: Modified independence  Comments: Step through pattern with decreased gait speed and stride length   TODAY'S TREATMENT:                                                                                                                              DATE:            03/30/22   EXERCISE LOG  Exercise Repetitions and Resistance Comments  Nustep L4, seat 12-11 x15 min   Rockerboard X2 min   LAQ X20 reps   Steated [employer] reps   Heel slides X20 reps seated    Ball Squeezes X3 mins   Ham Curls Red x 20 reps    Blank cell = exercise not performed today   Modalities  Date: 03/30/22 Unattended Estim: Knee, IFC, 10 mins, Pain and Edema Vaso: Knee, Low, 10 mins, Pain and Edema  PATIENT EDUCATION:  Education details: HEP, healing, prognosis, plan of care, and goals for therapy Person educated: Patient Education method: Explanation Education comprehension: verbalized understanding  HOME EXERCISE PROGRAM: Reviewed HEP provided by PT at the hospital  ASSESSMENT:  CLINICAL IMPRESSION: Pt arrives for today's treatment session reporting 4/10 right knee pain.  Pt able to tolerate increased reps today with all previously performed exercises with minimal discomfort.  Pt introduced to seated ham curls and ball squeezes with min cues required for proper  technique and hold time.  Normal responses to estim and vaso noted upon removal.  Pt reported decreased pain at completion of today's treatment session.   OBJECTIVE IMPAIRMENTS: Abnormal gait, decreased activity tolerance, decreased balance, decreased mobility, difficulty walking, decreased ROM, decreased strength, hypomobility, increased edema, impaired flexibility, impaired sensation, and pain.   ACTIVITY LIMITATIONS: carrying, lifting, bending, standing, squatting, sleeping, stairs, transfers, bed mobility, bathing, dressing, and locomotion level  PARTICIPATION LIMITATIONS: meal prep, cleaning, laundry, driving, shopping, community activity, occupation, yard work, and church  PERSONAL FACTORS: Transportation and 3+ comorbidities: Hypertension, diabetes, history of seizures, osteoarthritis, chronic low back pain, depression, asthma, allergies, and anxiety  are also affecting patient's functional outcome.   REHAB POTENTIAL: Good  CLINICAL DECISION MAKING: Evolving/moderate complexity  EVALUATION COMPLEXITY: Moderate  GOALS: Goals reviewed with patient? Yes  SHORT TERM GOALS: Target date: 04/12/22 Patient will be independent with her initial HEP. Baseline: Goal status: INITIAL  2.  Patient will be able to demonstrate at least 90 degrees of active right knee flexion for improved knee mobility. Baseline:  Goal status: INITIAL  3.  Patient will be able to demonstrate active right knee extension within 10 degrees of neutral for improved gait mechanics. Baseline:  Goal status: INITIAL  4.  Patient will be able to transfer from sitting to standing with equal weight distribution throughout her lower extremities for improved functional transfers. Baseline:  Goal status: INITIAL  LONG TERM GOALS: Target date: 05/03/22  Patient will be independent with her advanced HEP. Baseline:  Goal status: INITIAL  2.  Patient will be able to demonstrate  at least 120 degrees of active right knee  flexion for improved function navigating stairs. Baseline:  Goal status: INITIAL  3.  Patient will be able to demonstrate active right knee extension within 5 degrees of neutral for improved gait mechanics. Baseline:  Goal status: INITIAL  4.  Patient will be able to navigate at least 3 steps with a reciprocal pattern for improved function with household mobility. Baseline:  Goal status: INITIAL  5.  Patient will be able to safely ambulate with a cane or the least restrictive assistive device for improved mobility. Baseline:  Goal status: INITIAL  PLAN:  PT FREQUENCY: 2-3x/week  PT DURATION: 6 weeks  PLANNED INTERVENTIONS: Therapeutic exercises, Therapeutic activity, Neuromuscular re-education, Balance training, Gait training, Patient/Family education, Self Care, Joint mobilization, Stair training, Electrical stimulation, Cryotherapy, Moist heat, Vasopneumatic device, Manual therapy, and Re-evaluation  PLAN FOR NEXT SESSION: nustep, heel slides, quad sets, SLR, PROM, and modalities as needed  Kathrynn Ducking, PTA 03/30/2022, 12:11 PM

## 2022-04-02 ENCOUNTER — Ambulatory Visit: Payer: Medicare Other

## 2022-04-02 DIAGNOSIS — M25661 Stiffness of right knee, not elsewhere classified: Secondary | ICD-10-CM

## 2022-04-02 DIAGNOSIS — M25561 Pain in right knee: Secondary | ICD-10-CM

## 2022-04-02 DIAGNOSIS — R600 Localized edema: Secondary | ICD-10-CM

## 2022-04-02 NOTE — Therapy (Signed)
OUTPATIENT PHYSICAL THERAPY LOWER EXTREMITY TREATMENT   Patient Name: Dawn Salas MRN: [id number] DOB:May 11, 1940, 82 y.o., female Today's Date: 04/02/2022  END OF SESSION:  PT End of Session - 04/02/22 1430     Visit Number 4    Number of Visits 12    Date for PT Re-Evaluation 05/18/22    PT Start Time 1428    Activity Tolerance Patient tolerated treatment well    Behavior During Therapy [facility] for tasks assessed/performed            Past Medical History:  Diagnosis Date   Allergy    Anxiety    Arthritis    Asthma    patient denies    Cataract    bilateral lens implants    Colon polyps    Depression    Diabetes mellitus    Type two   Diverticulitis    Double vision 2019   left eye   Family history of adverse reaction to anesthesia    daughter- N/V    Family history of ovarian cancer    Family history of pancreatic cancer    Frequency of urination    GERD (gastroesophageal reflux disease)    History of adverse reaction to anesthesia 03/19/2022   After regional block for TKA pt states she had uncontrollable frequent blinking and "zoned out" was unable to speak more than one or two words for about 10 minutes   History of hiatal hernia    Hyperglycemia    Hyperlipidemia    Hypertension    Hyperthyroidism    PT HAD BIPOSY -NEGATIVE.Marland Kitchen AND NOW JUST GETS CHECKED EACH YEAR .Marland Kitchen 2013 LAST TEST   SEES DR. [name].    Multiple meningiomas of spine and brain ([place])    Obstructive sleep apnea    [facility]Y [facility] DOES NOT USE [facility] PAP   Palpitations    Peptic ulcer disease    Pneumonia    "walking pneumonia" hx of   Seizures ([place]) 2015   one siezure with brain surgery none since 2015    Sleep apnea    no CPAP   Urinary tract infection 03/2020   Vertigo    Past Surgical History:  Procedure Laterality Date   ABDOMINAL HYSTERECTOMY     ANTERIOR CERVICAL DECOMP/DISCECTOMY FUSION N/A 04/19/2021   Procedure: Anterior Cervical Discectomy and Fusion with Interbody Prosthesis,  Plates/Screws Cervical Three-Four/Cervical Four-Five REMoval CERVICAL PLATE;  Surgeon: Newman Pies, MD;  Location: [place];  Service: Neurosurgery;  Laterality: N/A;  3C   BACK SURGERY     L SPIN 2003   NECK 2004   BRAIN MENINGIOMA EXCISION  08/2008   X 2   C5-C6 neck fusion     COLONOSCOPY  05/05/2009   pt states multiple polyps removed in both colonoscopies she has had   CRANIOTOMY  08/13/2011   Procedure: CRANIOTOMY TUMOR EXCISION;  Surgeon: [name], MD;  Location: [place] [facility];  Service: Neurosurgery;  Laterality: Bilateral;  Bifrontal craniotomy for tumor   DILATION AND CURETTAGE OF UTERUS     YEARS AGO   L4-L5 posterior la  2021   L3-L5 fusion   left foot plantar and hammertoe     right foot bunectomy     right knee arthroscopy     x 3   right shoulder rotator cuff     ROBOTIC ASSISTED BILATERAL SALPINGO OOPHERECTOMY N/A 08/25/2015   Procedure: XI ROBOTIC ASSISTED BILATERAL SALPINGO OOPHORECTOMY;  Surgeon: [name], MD;  Location:  [facility];  Service: Gynecology;  Laterality: N/A;   TONSILLECTOMY     TOTAL KNEE ARTHROPLASTY Right 03/19/2022   Procedure: TOTAL KNEE ARTHROPLASTY;  Surgeon: Gaynelle Arabian, MD;  Location: [facility];  Service: Orthopedics;  Laterality: Right;   TUBAL LIGATION     WRIST SURGERY Right    Patient Active Problem List   Diagnosis Date Noted   OA (osteoarthritis) of knee 03/19/2022   Cervical spondylosis with myelopathy and radiculopathy 04/19/2021   Genetic testing 08/01/2020   Family history of ovarian cancer 07/14/2020   Colon polyps 07/14/2020   Family history of pancreatic cancer 07/14/2020   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   CKD (chronic kidney disease) stage 3, GFR 30-59 ml/min (HCC) 10/01/2017   History of normocytic normochromic anemia 11/25/2015   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   Spondylolisthesis of lumbar region 12/23/2013    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   Hyperthyroidism 05/25/2013   Obesity (BMI 30-39.9) 12/30/2012   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[place]) 08/21/2011   Meningioma ([place]) 08/13/2011   DIZZINESS 11/02/2009   Type 2 diabetes mellitus, controlled ([place]) 05/20/2009   HYPOKALEMIA 05/02/2009   DEPRESSION 06/24/2008   GERD 06/24/2008   PEPTIC ULCER DISEASE 06/24/2008   TMJ SYNDROME 06/09/2008   Hyperlipemia 11/08/2006   OBSTRUCTIVE SLEEP APNEA 11/08/2006   Essential hypertension 11/08/2006   ALLERGIC RHINITIS 11/08/2006   VOCAL CORD DISORDER 11/08/2006   ASTHMA 11/08/2006   TACHYARRHYTHMIA 11/08/2006   PCP: Eulas Post, MD  REFERRING PROVIDER: Jearld Lesch, PA   REFERRING DIAG: Unilateral primary osteoarthritis, right knee   THERAPY DIAG:  Acute pain of right knee  Stiffness of right knee, not elsewhere classified  Localized edema  Rationale for Evaluation and Treatment: Rehabilitation  ONSET DATE: 03/19/22  SUBJECTIVE:   SUBJECTIVE STATEMENT: Patient reports feeling better today. Pt denies any pain.  PERTINENT HISTORY: Hypertension, diabetes, history of seizures, osteoarthritis, chronic low back pain, depression, asthma, allergies, and anxiety PAIN:  Are you having pain? No  PRECAUTIONS: None  PATIENT GOALS: be able to walk with a cane, reduced pain , improved mobility, and return to swimming  NEXT MD VISIT: 04/03/22  OBJECTIVE:   PATIENT SURVEYS:  FOTO 26.12   SENSATION: Patient reports intermittent tingling in her right foot since surgery, but none currently  EDEMA:  Circumferential: Right knee (joint line): 52.5 cm Left knee (joint line): 48 cm  PALPATION: TTP: right quadriceps, hip adductors, and lateral joint line  LOWER EXTREMITY ROM:  Active ROM Right eval Left eval  Hip flexion    Hip extension    Hip abduction    Hip adduction    Hip internal rotation    Hip external rotation    Knee flexion 57/ 77 (PROM)  122  Knee extension  17 0  Ankle dorsiflexion    Ankle plantarflexion    Ankle inversion    Ankle eversion     (Blank rows = not tested)  LOWER EXTREMITY MMT: not tested due to surgical condition  LOWER EXTREMITY SPECIAL TESTS: not tested due to surgical condition  FUNCTIONAL TESTS:  Significant difficulty with sit to stand and stand to sit transfers while avoiding WB on RLE  GAIT: Assistive device utilized: Walker - 2 wheeled Level of assistance: Modified independence Comments: Step through pattern with decreased gait speed and stride length   TODAY'S TREATMENT:  DATE:            04/02/22   EXERCISE LOG  Exercise Repetitions and Resistance Comments  Nustep L4, seat 10-9 x15 min   Lunges 8" box x 15 reps   Forward Step Ups 4" box x 15 reps   Rockerboard X3 min   LAQ X20 reps   Steated [employer] reps   Heel slides X20 reps seated    Ball Squeezes X3 mins   Ham Curls Red x 20 reps    Blank cell = exercise not performed today   Modalities  Date: 04/02/22 Unattended Estim: Knee, IFC, 10 mins, Pain and Edema Vaso: Knee, Low, 10 mins, Pain and Edema  PATIENT EDUCATION:  Education details: HEP, healing, prognosis, plan of care, and goals for therapy Person educated: Patient Education method: Explanation Education comprehension: verbalized understanding  HOME EXERCISE PROGRAM: Reviewed HEP provided by PT at the hospital  ASSESSMENT:  CLINICAL IMPRESSION: Pt arrives for today's treatment session denying any pain.  Pt able to tolerate progress to seat 9 today on the Nustep without issue.  Pt able to demonstrate 79 degrees of active knee flexion and -16 degrees of active knee extension, making good progress towards her goals.  Pt able to tolerate introduction to forward step ups today with min cues to avoid pulling with BUEs.  Normal responses to estim and vaso noted upon  removal.  Pt would benefit from work on extension at next treatment session.  Pt denied any pain at completion of today's treatment session.   OBJECTIVE IMPAIRMENTS: Abnormal gait, decreased activity tolerance, decreased balance, decreased mobility, difficulty walking, decreased ROM, decreased strength, hypomobility, increased edema, impaired flexibility, impaired sensation, and pain.   ACTIVITY LIMITATIONS: carrying, lifting, bending, standing, squatting, sleeping, stairs, transfers, bed mobility, bathing, dressing, and locomotion level  PARTICIPATION LIMITATIONS: meal prep, cleaning, laundry, driving, shopping, community activity, occupation, yard work, and church  PERSONAL FACTORS: Transportation and 3+ comorbidities: Hypertension, diabetes, history of seizures, osteoarthritis, chronic low back pain, depression, asthma, allergies, and anxiety  are also affecting patient's functional outcome.   REHAB POTENTIAL: Good  CLINICAL DECISION MAKING: Evolving/moderate complexity  EVALUATION COMPLEXITY: Moderate  GOALS: Goals reviewed with patient? Yes  SHORT TERM GOALS: Target date: 04/12/22 Patient will be independent with her initial HEP. Baseline: Goal status: IN PROGRESS  2.  Patient will be able to demonstrate at least 90 degrees of active right knee flexion for improved knee mobility. Baseline: 3/11: 79 degrees Goal status: IN PROGRESS  3.  Patient will be able to demonstrate active right knee extension within 10 degrees of neutral for improved gait mechanics. Baseline: 3/11: -16 degrees Goal status: IN PROGRESS  4.  Patient will be able to transfer from sitting to standing with equal weight distribution throughout her lower extremities for improved functional transfers. Baseline:  Goal status: MET  LONG TERM GOALS: Target date: 05/03/22  Patient will be independent with her advanced HEP. Baseline:  Goal status: IN PROGRESS  2.  Patient will be able to demonstrate at least 120  degrees of active right knee flexion for improved function navigating stairs. Baseline:  Goal status: IN PROGRESS  3.  Patient will be able to demonstrate active right knee extension within 5 degrees of neutral for improved gait mechanics. Baseline:  Goal status: IN PROGRESS  4.  Patient will be able to navigate at least 3 steps with a reciprocal pattern for improved function with household mobility. Baseline:  Goal status: IN PROGRESS  5.  Patient  will be able to safely ambulate with a cane or the least restrictive assistive device for improved mobility. Baseline:  Goal status: IN PROGRESS  PLAN:  PT FREQUENCY: 2-3x/week  PT DURATION: 6 weeks  PLANNED INTERVENTIONS: Therapeutic exercises, Therapeutic activity, Neuromuscular re-education, Balance training, Gait training, Patient/Family education, Self Care, Joint mobilization, Stair training, Electrical stimulation, Cryotherapy, Moist heat, Vasopneumatic device, Manual therapy, and Re-evaluation  PLAN FOR NEXT SESSION: nustep, heel slides, quad sets, SLR, PROM, and modalities as needed  Kathrynn Ducking, PTA 04/02/2022, 3:21 PM

## 2022-04-06 ENCOUNTER — Ambulatory Visit: Payer: Medicare Other | Admitting: *Deleted

## 2022-04-06 DIAGNOSIS — M25561 Pain in right knee: Secondary | ICD-10-CM

## 2022-04-06 DIAGNOSIS — M25661 Stiffness of right knee, not elsewhere classified: Secondary | ICD-10-CM

## 2022-04-06 DIAGNOSIS — R600 Localized edema: Secondary | ICD-10-CM

## 2022-04-06 NOTE — Therapy (Signed)
OUTPATIENT PHYSICAL THERAPY LOWER EXTREMITY TREATMENT   Patient Name: Dawn Salas MRN: [id number] DOB:1940/08/06, 82 y.o., female Today's Date: 04/06/2022  END OF SESSION:  PT End of Session - 04/06/22 1120     Visit Number 5    Number of Visits 12    Date for PT Re-Evaluation 05/18/22    PT Start Time 1115            Past Medical History:  Diagnosis Date   Allergy    Anxiety    Arthritis    Asthma    patient denies    Cataract    bilateral lens implants    Colon polyps    Depression    Diabetes mellitus    Type two   Diverticulitis    Double vision 2019   left eye   Family history of adverse reaction to anesthesia    daughter- N/V    Family history of ovarian cancer    Family history of pancreatic cancer    Frequency of urination    GERD (gastroesophageal reflux disease)    History of adverse reaction to anesthesia 03/19/2022   After regional block for TKA pt states she had uncontrollable frequent blinking and "zoned out" was unable to speak more than one or two words for about 10 minutes   History of hiatal hernia    Hyperglycemia    Hyperlipidemia    Hypertension    Hyperthyroidism    PT HAD BIPOSY -NEGATIVE.Marland Kitchen AND NOW JUST GETS CHECKED EACH YEAR .Marland Kitchen 2013 LAST TEST   SEES DR. [name].    Multiple meningiomas of spine and brain ([place])    Obstructive sleep apnea    [facility]Y [facility] DOES NOT USE [facility] PAP   Palpitations    Peptic ulcer disease    Pneumonia    "walking pneumonia" hx of   Seizures ([place]) 2015   one siezure with brain surgery none since 2015    Sleep apnea    no CPAP   Urinary tract infection 03/2020   Vertigo    Past Surgical History:  Procedure Laterality Date   ABDOMINAL HYSTERECTOMY     ANTERIOR CERVICAL DECOMP/DISCECTOMY FUSION N/A 04/19/2021   Procedure: Anterior Cervical Discectomy and Fusion with Interbody Prosthesis, Plates/Screws Cervical Three-Four/Cervical Four-Five REMoval CERVICAL PLATE;  Surgeon: Newman Pies, MD;  Location:  [facility];  Service: Neurosurgery;  Laterality: N/A;  3C   BACK SURGERY     L SPIN 2003   NECK 2004   BRAIN MENINGIOMA EXCISION  08/2008   X 2   C5-C6 neck fusion     COLONOSCOPY  05/05/2009   pt states multiple polyps removed in both colonoscopies she has had   CRANIOTOMY  08/13/2011   Procedure: CRANIOTOMY TUMOR EXCISION;  Surgeon: [name], MD;  Location: [place] [facility];  Service: Neurosurgery;  Laterality: Bilateral;  Bifrontal craniotomy for tumor   DILATION AND CURETTAGE OF UTERUS     YEARS AGO   L4-L5 posterior la  2021   L3-L5 fusion   left foot plantar and hammertoe     right foot bunectomy     right knee arthroscopy     x 3   right shoulder rotator cuff     ROBOTIC ASSISTED BILATERAL SALPINGO OOPHERECTOMY N/A 08/25/2015   Procedure: XI ROBOTIC ASSISTED BILATERAL SALPINGO OOPHORECTOMY;  Surgeon: [name], MD;  [facility];  Service: Gynecology;  Laterality: N/A;   TONSILLECTOMY     TOTAL KNEE ARTHROPLASTY Right  03/19/2022   Procedure: TOTAL KNEE ARTHROPLASTY;  Surgeon: Gaynelle Arabian, MD;  [facility];  Service: Orthopedics;  Laterality: Right;   TUBAL LIGATION     WRIST SURGERY Right    Patient Active Problem List   Diagnosis Date Noted   OA (osteoarthritis) of knee 03/19/2022   Cervical spondylosis with myelopathy and radiculopathy 04/19/2021   Genetic testing 08/01/2020   Family history of ovarian cancer 07/14/2020   Colon polyps 07/14/2020   Family history of pancreatic cancer 07/14/2020   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   CKD (chronic kidney disease) stage 3, GFR 30-59 ml/min (HCC) 10/01/2017   History of normocytic normochromic anemia 11/25/2015   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   Spondylolisthesis of lumbar region 12/23/2013   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   Hyperthyroidism 05/25/2013   Obesity (BMI 30-39.9) 12/30/2012   Partial  seizure disorder ([place]) 08/21/2011   Meningioma ([place]) 08/13/2011   DIZZINESS 11/02/2009   Type 2 diabetes mellitus, controlled ([place]) 05/20/2009   HYPOKALEMIA 05/02/2009   DEPRESSION 06/24/2008   GERD 06/24/2008   PEPTIC ULCER DISEASE 06/24/2008   TMJ SYNDROME 06/09/2008   Hyperlipemia 11/08/2006   OBSTRUCTIVE SLEEP APNEA 11/08/2006   Essential hypertension 11/08/2006   ALLERGIC RHINITIS 11/08/2006   VOCAL CORD DISORDER 11/08/2006   ASTHMA 11/08/2006   TACHYARRHYTHMIA 11/08/2006   PCP: Eulas Post, MD  REFERRING PROVIDER: Jearld Lesch, PA   REFERRING DIAG: Unilateral primary osteoarthritis, right knee   THERAPY DIAG:  Acute pain of right knee  Stiffness of right knee, not elsewhere classified  Localized edema  Rationale for Evaluation and Treatment: Rehabilitation  ONSET DATE: 03/19/22  SUBJECTIVE:   SUBJECTIVE STATEMENT: Patient reports feeling better today. Pt denies any pain.MD pleased and  Removed bandage  PERTINENT HISTORY: Hypertension, diabetes, history of seizures, osteoarthritis, chronic low back pain, depression, asthma, allergies, and anxiety PAIN:  Are you having pain? No  PRECAUTIONS: None  PATIENT GOALS: be able to walk with a cane, reduced pain , improved mobility, and return to swimming  NEXT MD VISIT: 04/03/22  OBJECTIVE:   PATIENT SURVEYS:  FOTO 26.12   SENSATION: Patient reports intermittent tingling in her right foot since surgery, but none currently  EDEMA:  Circumferential: Right knee (joint line): 52.5 cm Left knee (joint line): 48 cm  PALPATION: TTP: right quadriceps, hip adductors, and lateral joint line  LOWER EXTREMITY ROM:  Active ROM Right eval Left eval  Hip flexion    Hip extension    Hip abduction    Hip adduction    Hip internal rotation    Hip external rotation    Knee flexion 57/ 77 (PROM)  122  Knee extension 17 0  Ankle dorsiflexion    Ankle plantarflexion    Ankle inversion    Ankle eversion      (Blank rows = not tested)  LOWER EXTREMITY MMT: not tested due to surgical condition  LOWER EXTREMITY SPECIAL TESTS: not tested due to surgical condition  FUNCTIONAL TESTS:  Significant difficulty with sit to stand and stand to sit transfers while avoiding WB on RLE  GAIT: Assistive device utilized: Walker - 2 wheeled Level of assistance: Modified independence Comments: Step through pattern with decreased gait speed and stride length   TODAY'S TREATMENT:  DATE:            04/06/22   EXERCISE LOG  Exercise Repetitions and Resistance Comments  Nustep L4, seat 10-9 x15 min   Lunges 8" box x 15 reps   Forward Step Ups 6" box x 15 reps   Rockerboard X3 min   LAQ X20 reps   Seated [employer] X20 reps   Heel slides X20 reps    Ball Squeezes X3 mins   Ham Curls     Blank cell = exercise not performed today   PROM to 88 degrees today Modalities  Date: 04/02/22 Unattended Estim: Knee, IFC, 15 mins, Pain and Edema Vaso: Knee, Low, 15 mins, Pain and Edema  PATIENT EDUCATION:  Education details: HEP, healing, prognosis, plan of care, and goals for therapy Person educated: Patient Education method: Explanation Education comprehension: verbalized understanding  HOME EXERCISE PROGRAM: Reviewed HEP provided by PT at the hospital  ASSESSMENT:  CLINICAL IMPRESSION: Pt arrives for today's treatment session denying any pain and reporting doing well. They removed bandage and said everything looked good. Rx focused on sitting and standing therex for ROM progression as well as strengthening. Pt did well  with RX. PROM for flexion 88 degrees today with greatest deficit being extension at -15 degrees. Vaso and IFC end of session tolerated well.      OBJECTIVE IMPAIRMENTS: Abnormal gait, decreased activity tolerance, decreased balance, decreased mobility, difficulty  walking, decreased ROM, decreased strength, hypomobility, increased edema, impaired flexibility, impaired sensation, and pain.   ACTIVITY LIMITATIONS: carrying, lifting, bending, standing, squatting, sleeping, stairs, transfers, bed mobility, bathing, dressing, and locomotion level  PARTICIPATION LIMITATIONS: meal prep, cleaning, laundry, driving, shopping, community activity, occupation, yard work, and church  PERSONAL FACTORS: Transportation and 3+ comorbidities: Hypertension, diabetes, history of seizures, osteoarthritis, chronic low back pain, depression, asthma, allergies, and anxiety  are also affecting patient's functional outcome.   REHAB POTENTIAL: Good  CLINICAL DECISION MAKING: Evolving/moderate complexity  EVALUATION COMPLEXITY: Moderate  GOALS: Goals reviewed with patient? Yes  SHORT TERM GOALS: Target date: 04/12/22 Patient will be independent with her initial HEP. Baseline: Goal status: IN PROGRESS  2.  Patient will be able to demonstrate at least 90 degrees of active right knee flexion for improved knee mobility. Baseline: 3/11: 79 degrees Goal status: IN PROGRESS  3.  Patient will be able to demonstrate active right knee extension within 10 degrees of neutral for improved gait mechanics. Baseline: 3/11: -16 degrees Goal status: IN PROGRESS  4.  Patient will be able to transfer from sitting to standing with equal weight distribution throughout her lower extremities for improved functional transfers. Baseline:  Goal status: MET  LONG TERM GOALS: Target date: 05/03/22  Patient will be independent with her advanced HEP. Baseline:  Goal status: IN PROGRESS  2.  Patient will be able to demonstrate at least 120 degrees of active right knee flexion for improved function navigating stairs. Baseline:  Goal status: IN PROGRESS  3.  Patient will be able to demonstrate active right knee extension within 5 degrees of neutral for improved gait mechanics. Baseline:  Goal  status: IN PROGRESS  4.  Patient will be able to navigate at least 3 steps with a reciprocal pattern for improved function with household mobility. Baseline:  Goal status: IN PROGRESS  5.  Patient will be able to safely ambulate with a cane or the least restrictive assistive device for improved mobility. Baseline:  Goal status: IN PROGRESS  PLAN:  PT FREQUENCY: 2-3x/week  PT DURATION:  6 weeks  PLANNED INTERVENTIONS: Therapeutic exercises, Therapeutic activity, Neuromuscular re-education, Balance training, Gait training, Patient/Family education, Self Care, Joint mobilization, Stair training, Electrical stimulation, Cryotherapy, Moist heat, Vasopneumatic device, Manual therapy, and Re-evaluation  PLAN FOR NEXT SESSION: nustep, heel slides, quad sets, SLR, PROM, and modalities as needed  Valaree Fresquez,CHRIS, PTA 04/06/2022, 12:45 PM

## 2022-04-10 ENCOUNTER — Ambulatory Visit: Payer: Medicare Other | Admitting: Physical Therapy

## 2022-04-10 ENCOUNTER — Encounter: Payer: Self-pay | Admitting: Physical Therapy

## 2022-04-10 DIAGNOSIS — M25561 Pain in right knee: Secondary | ICD-10-CM

## 2022-04-10 DIAGNOSIS — R600 Localized edema: Secondary | ICD-10-CM

## 2022-04-10 DIAGNOSIS — M25661 Stiffness of right knee, not elsewhere classified: Secondary | ICD-10-CM

## 2022-04-10 NOTE — Therapy (Signed)
OUTPATIENT PHYSICAL THERAPY LOWER EXTREMITY TREATMENT   Patient Name: Dawn Salas MRN: [id number] DOB:March 13, 1940, 82 y.o., female Today's Date: 04/10/2022  END OF SESSION:  PT End of Session - 04/10/22 1348     Visit Number 6    Number of Visits 12    Date for PT Re-Evaluation 05/18/22    PT Start Time 1346    PT Stop Time 1435    PT Time Calculation (min) 49 min    Equipment Utilized During Treatment Other (comment)   [place]   Activity Tolerance Patient tolerated treatment well    Behavior During Therapy WFL for tasks assessed/performed            Past Medical History:  Diagnosis Date   Allergy    Anxiety    Arthritis    Asthma    patient denies    Cataract    bilateral lens implants    Colon polyps    Depression    Diabetes mellitus    Type two   Diverticulitis    Double vision 2019   left eye   Family history of adverse reaction to anesthesia    daughter- N/V    Family history of ovarian cancer    Family history of pancreatic cancer    Frequency of urination    GERD (gastroesophageal reflux disease)    History of adverse reaction to anesthesia 03/19/2022   After regional block for TKA pt states she had uncontrollable frequent blinking and "zoned out" was unable to speak more than one or two words for about 10 minutes   History of hiatal hernia    Hyperglycemia    Hyperlipidemia    Hypertension    Hyperthyroidism    PT HAD BIPOSY -NEGATIVE.Marland Kitchen AND NOW JUST GETS CHECKED EACH YEAR .Marland Kitchen 2013 LAST TEST   SEES DR. [name].    Multiple meningiomas of spine and brain ([place])    Obstructive sleep apnea    STUDY AT [facility] DOES NOT USE C PAP   Palpitations    Peptic ulcer disease    Pneumonia    "walking pneumonia" hx of   Seizures ([place]) 2015   one siezure with brain surgery none since 2015    Sleep apnea    no CPAP   Urinary tract infection 03/2020   Vertigo    Past Surgical History:  Procedure Laterality Date   ABDOMINAL HYSTERECTOMY     ANTERIOR CERVICAL  DECOMP/DISCECTOMY FUSION N/A 04/19/2021   Procedure: Anterior Cervical Discectomy and Fusion with Interbody Prosthesis, Plates/Screws Cervical Three-Four/Cervical Four-Five REMoval CERVICAL PLATE;  Surgeon: Newman Pies, MD;  Location: [facility];  Service: Neurosurgery;  Laterality: N/A;  3C   BACK SURGERY     L SPIN 2003   NECK 2004   BRAIN MENINGIOMA EXCISION  08/2008   X 2   C5-C6 neck fusion     COLONOSCOPY  05/05/2009   pt states multiple polyps removed in both colonoscopies she has had   CRANIOTOMY  08/13/2011   Procedure: CRANIOTOMY TUMOR EXCISION;  Surgeon: [name], MD;  Location: [facility];  Service: Neurosurgery;  Laterality: Bilateral;  Bifrontal craniotomy for tumor   DILATION AND CURETTAGE OF UTERUS     YEARS AGO   L4-L5 posterior la  2021   L3-L5 fusion   left foot plantar and hammertoe     right foot bunectomy     right knee arthroscopy     x 3   right shoulder rotator cuff  ROBOTIC ASSISTED BILATERAL SALPINGO OOPHERECTOMY N/A 08/25/2015   Procedure: XI ROBOTIC ASSISTED BILATERAL SALPINGO OOPHORECTOMY;  Surgeon: [name], MD;  Location: [facility];  Service: Gynecology;  Laterality: N/A;   TONSILLECTOMY     TOTAL KNEE ARTHROPLASTY Right 03/19/2022   Procedure: TOTAL KNEE ARTHROPLASTY;  Surgeon: Gaynelle Arabian, MD;  Location: [facility];  Service: Orthopedics;  Laterality: Right;   TUBAL LIGATION     WRIST SURGERY Right    Patient Active Problem List   Diagnosis Date Noted   OA (osteoarthritis) of knee 03/19/2022   Cervical spondylosis with myelopathy and radiculopathy 04/19/2021   Genetic testing 08/01/2020   Family history of ovarian cancer 07/14/2020   Colon polyps 07/14/2020   Family history of pancreatic cancer 07/14/2020   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   CKD (chronic kidney disease) stage 3, GFR 30-59 ml/min (HCC) 10/01/2017   History of normocytic normochromic anemia 11/25/2015   Multinodular goiter  (nontoxic) 03/21/2015   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   Spondylolisthesis of lumbar region 12/23/2013   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   Hyperthyroidism 05/25/2013   Obesity (BMI 30-39.9) 12/30/2012   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[place]) 08/21/2011   Meningioma ([place]) 08/13/2011   DIZZINESS 11/02/2009   Type 2 diabetes mellitus, controlled ([place]) 05/20/2009   HYPOKALEMIA 05/02/2009   DEPRESSION 06/24/2008   GERD 06/24/2008   PEPTIC ULCER DISEASE 06/24/2008   TMJ SYNDROME 06/09/2008   Hyperlipemia 11/08/2006   OBSTRUCTIVE SLEEP APNEA 11/08/2006   Essential hypertension 11/08/2006   ALLERGIC RHINITIS 11/08/2006   VOCAL CORD DISORDER 11/08/2006   ASTHMA 11/08/2006   TACHYARRHYTHMIA 11/08/2006   PCP: Eulas Post, MD  REFERRING PROVIDER: Jearld Lesch, PA   REFERRING DIAG: Unilateral primary osteoarthritis, right knee   THERAPY DIAG:  Acute pain of right knee  Stiffness of right knee, not elsewhere classified  Localized edema  Rationale for Evaluation and Treatment: Rehabilitation  ONSET DATE: 03/19/22  SUBJECTIVE:   SUBJECTIVE STATEMENT: Reports feeling a pulling sensation in inferior incision. Compliant with HEP.  PERTINENT HISTORY: Hypertension, diabetes, history of seizures, osteoarthritis, chronic low back pain, depression, asthma, allergies, and anxiety  PAIN: Are you having pain? No  PRECAUTIONS: None  PATIENT GOALS: be able to walk with a cane, reduced pain , improved mobility, and return to swimming  NEXT MD VISIT: 04/25/2022  OBJECTIVE:   PATIENT SURVEYS:  FOTO 26.12   SENSATION: Patient reports intermittent tingling in her right foot since surgery, but none currently  EDEMA:  Circumferential: Right knee (joint line): 52.5 cm Left knee (joint line): 48 cm  PALPATION: TTP: right quadriceps, hip adductors, and lateral joint line  LOWER EXTREMITY ROM:  Active ROM Right eval Left eval  Hip flexion    Hip extension     Hip abduction    Hip adduction    Hip internal rotation    Hip external rotation    Knee flexion 57/ 77 (PROM)  122  Knee extension 17 0  Ankle dorsiflexion    Ankle plantarflexion    Ankle inversion    Ankle eversion     (Blank rows = not tested)  FUNCTIONAL TESTS:  Significant difficulty with sit to stand and stand to sit transfers while avoiding WB on RLE  GAIT: Assistive device utilized: SPC Level of assistance: Modified independence Comments: Step through pattern with decreased gait speed and stride length   TODAY'S TREATMENT:  DATE:            04/10/22   EXERCISE LOG  Exercise Repetitions and Resistance Comments  Nustep L4, seat 10-9 x15 min   Lunges 6" box x 15 reps   Forward Step Ups 6" box x 15 reps   Slant board X2 min   LAQ X20 reps   Knee extension stretch X3 min    Blank cell = exercise not performed today   Modalities  Date: 04/10/22 Unattended Estim: Knee, IFC, 10 mins, Pain and Edema Vaso: Knee, Low, 10 mins, Pain and Edema  PATIENT EDUCATION:  Education details: HEP, healing, prognosis, plan of care, and goals for therapy Person educated: Patient Education method: Explanation Education comprehension: verbalized understanding  HOME EXERCISE PROGRAM: Reviewed HEP provided by PT at the hospital  ASSESSMENT:  CLINICAL IMPRESSION: Patient presented in clinic with reports of no R knee pain. Patient able to tolerate therex well as see improvements with R knee flexion. Patient has 13 stairs at home to get to her second level where her bedroom is normally where she sleeps but is currently downstairs. Patient now using [facility] for ambulation and using SPC in RUE for comfort and percieved safety. AROM R knee flexion improved to 91 deg. Normal modalities response noted following removal of the modalities.  OBJECTIVE IMPAIRMENTS: Abnormal  gait, decreased activity tolerance, decreased balance, decreased mobility, difficulty walking, decreased ROM, decreased strength, hypomobility, increased edema, impaired flexibility, impaired sensation, and pain.   ACTIVITY LIMITATIONS: carrying, lifting, bending, standing, squatting, sleeping, stairs, transfers, bed mobility, bathing, dressing, and locomotion level  PARTICIPATION LIMITATIONS: meal prep, cleaning, laundry, driving, shopping, community activity, occupation, yard work, and church  PERSONAL FACTORS: Transportation and 3+ comorbidities: Hypertension, diabetes, history of seizures, osteoarthritis, chronic low back pain, depression, asthma, allergies, and anxiety  are also affecting patient's functional outcome.   REHAB POTENTIAL: Good  CLINICAL DECISION MAKING: Evolving/moderate complexity  EVALUATION COMPLEXITY: Moderate  GOALS: Goals reviewed with patient? Yes  SHORT TERM GOALS: Target date: 04/12/22 Patient will be independent with her initial HEP. Baseline: Goal status: IN PROGRESS  2.  Patient will be able to demonstrate at least 90 degrees of active right knee flexion for improved knee mobility. Baseline: 3/11: 79 degrees Goal status: IN PROGRESS  3.  Patient will be able to demonstrate active right knee extension within 10 degrees of neutral for improved gait mechanics. Baseline: 3/11: -16 degrees Goal status: IN PROGRESS  4.  Patient will be able to transfer from sitting to standing with equal weight distribution throughout her lower extremities for improved functional transfers. Baseline:  Goal status: MET  LONG TERM GOALS: Target date: 05/03/22  Patient will be independent with her advanced HEP. Baseline:  Goal status: IN PROGRESS  2.  Patient will be able to demonstrate at least 120 degrees of active right knee flexion for improved function navigating stairs. Baseline:  Goal status: IN PROGRESS  3.  Patient will be able to demonstrate active right knee  extension within 5 degrees of neutral for improved gait mechanics. Baseline:  Goal status: IN PROGRESS  4.  Patient will be able to navigate at least 3 steps with a reciprocal pattern for improved function with household mobility. Baseline:  Goal status: IN PROGRESS  5.  Patient will be able to safely ambulate with a cane or the least restrictive assistive device for improved mobility. Baseline:  Goal status: IN PROGRESS  PLAN:  PT FREQUENCY: 2-3x/week  PT DURATION: 6 weeks  PLANNED INTERVENTIONS: Therapeutic exercises,  Therapeutic activity, Neuromuscular re-education, Balance training, Gait training, Patient/Family education, Self Care, Joint mobilization, Stair training, Electrical stimulation, Cryotherapy, Moist heat, Vasopneumatic device, Manual therapy, and Re-evaluation  PLAN FOR NEXT SESSION: nustep, heel slides, quad sets, SLR, PROM, and modalities as needed  Standley Brooking, PTA 04/10/2022, 2:44 PM

## 2022-04-13 ENCOUNTER — Encounter: Payer: Self-pay | Admitting: Physical Therapy

## 2022-04-13 ENCOUNTER — Ambulatory Visit: Payer: Medicare Other | Admitting: Physical Therapy

## 2022-04-13 DIAGNOSIS — M25561 Pain in right knee: Secondary | ICD-10-CM | POA: Diagnosis not present

## 2022-04-13 DIAGNOSIS — R600 Localized edema: Secondary | ICD-10-CM

## 2022-04-13 DIAGNOSIS — M25661 Stiffness of right knee, not elsewhere classified: Secondary | ICD-10-CM

## 2022-04-13 NOTE — Therapy (Signed)
OUTPATIENT PHYSICAL THERAPY LOWER EXTREMITY TREATMENT   Patient Name: Dawn Salas MRN: [id number] DOB:Mar 11, 1940, 82 y.o., female Today's Date: 04/13/2022  END OF SESSION:  PT End of Session - 04/13/22 1157     Visit Number 7    Number of Visits 12    PT Start Time 1050    PT Stop Time 1153    PT Time Calculation (min) 63 min    Activity Tolerance Patient tolerated treatment well    Behavior During Therapy WFL for tasks assessed/performed            Past Medical History:  Diagnosis Date   Allergy    Anxiety    Arthritis    Asthma    patient denies    Cataract    bilateral lens implants    Colon polyps    Depression    Diabetes mellitus    Type two   Diverticulitis    Double vision 2019   left eye   Family history of adverse reaction to anesthesia    daughter- N/V    Family history of ovarian cancer    Family history of pancreatic cancer    Frequency of urination    GERD (gastroesophageal reflux disease)    History of adverse reaction to anesthesia 03/19/2022   After regional block for TKA pt states she had uncontrollable frequent blinking and "zoned out" was unable to speak more than one or two words for about 10 minutes   History of hiatal hernia    Hyperglycemia    Hyperlipidemia    Hypertension    Hyperthyroidism    PT HAD BIPOSY -NEGATIVE.Marland Kitchen AND NOW JUST GETS CHECKED EACH YEAR .Marland Kitchen 2013 LAST TEST   SEES DR. [name].    Multiple meningiomas of spine and brain ([place])    Obstructive sleep apnea    [facility]Y [facility] DOES NOT USE [facility] PAP   Palpitations    Peptic ulcer disease    Pneumonia    "walking pneumonia" hx of   Seizures ([place]) 2015   one siezure with brain surgery none since 2015    Sleep apnea    no CPAP   Urinary tract infection 03/2020   Vertigo    Past Surgical History:  Procedure Laterality Date   ABDOMINAL HYSTERECTOMY     ANTERIOR CERVICAL DECOMP/DISCECTOMY FUSION N/A 04/19/2021   Procedure: Anterior Cervical Discectomy and Fusion with  Interbody Prosthesis, Plates/Screws Cervical Three-Four/Cervical Four-Five REMoval CERVICAL PLATE;  Surgeon: Newman Pies, MD;  Location: [facility];  Service: Neurosurgery;  Laterality: N/A;  3C   BACK SURGERY     L SPIN 2003   NECK 2004   BRAIN MENINGIOMA EXCISION  08/2008   X 2   C5-C6 neck fusion     COLONOSCOPY  05/05/2009   pt states multiple polyps removed in both colonoscopies she has had   CRANIOTOMY  08/13/2011   Procedure: CRANIOTOMY TUMOR EXCISION;  Surgeon: [name], MD;  Location: [facility];  Service: Neurosurgery;  Laterality: Bilateral;  Bifrontal craniotomy for tumor   DILATION AND CURETTAGE OF UTERUS     YEARS AGO   L4-L5 posterior la  2021   L3-L5 fusion   left foot plantar and hammertoe     right foot bunectomy     right knee arthroscopy     x 3   right shoulder rotator cuff     ROBOTIC ASSISTED BILATERAL SALPINGO OOPHERECTOMY N/A 08/25/2015   Procedure: XI ROBOTIC ASSISTED BILATERAL SALPINGO  OOPHORECTOMY;  Surgeon: [name], MD;  [facility];  Service: Gynecology;  Laterality: N/A;   TONSILLECTOMY     TOTAL KNEE ARTHROPLASTY Right 03/19/2022   Procedure: TOTAL KNEE ARTHROPLASTY;  Surgeon: Gaynelle Arabian, MD;  [facility];  Service: Orthopedics;  Laterality: Right;   TUBAL LIGATION     WRIST SURGERY Right    Patient Active Problem List   Diagnosis Date Noted   OA (osteoarthritis) of knee 03/19/2022   Cervical spondylosis with myelopathy and radiculopathy 04/19/2021   Genetic testing 08/01/2020   Family history of ovarian cancer 07/14/2020   Colon polyps 07/14/2020   Family history of pancreatic cancer 07/14/2020   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   CKD (chronic kidney disease) stage 3, GFR 30-59 ml/min (HCC) 10/01/2017   History of normocytic normochromic anemia 11/25/2015   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   Spondylolisthesis of lumbar region  12/23/2013   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   Hyperthyroidism 05/25/2013   Obesity (BMI 30-39.9) 12/30/2012   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[place]) 08/21/2011   Meningioma ([place]) 08/13/2011   DIZZINESS 11/02/2009   Type 2 diabetes mellitus, controlled ([place]) 05/20/2009   HYPOKALEMIA 05/02/2009   DEPRESSION 06/24/2008   GERD 06/24/2008   PEPTIC ULCER DISEASE 06/24/2008   TMJ SYNDROME 06/09/2008   Hyperlipemia 11/08/2006   OBSTRUCTIVE SLEEP APNEA 11/08/2006   Essential hypertension 11/08/2006   ALLERGIC RHINITIS 11/08/2006   VOCAL CORD DISORDER 11/08/2006   ASTHMA 11/08/2006   TACHYARRHYTHMIA 11/08/2006   PCP: Eulas Post, MD  REFERRING PROVIDER: Jearld Lesch, PA   REFERRING DIAG: Unilateral primary osteoarthritis, right knee   THERAPY DIAG:  Acute pain of right knee  Stiffness of right knee, not elsewhere classified  Localized edema  Rationale for Evaluation and Treatment: Rehabilitation  ONSET DATE: 03/19/22  SUBJECTIVE:   SUBJECTIVE STATEMENT: Inside part of knee hurts. PERTINENT HISTORY: Hypertension, diabetes, history of seizures, osteoarthritis, chronic low back pain, depression, asthma, allergies, and anxiety  PAIN: Are you having pain? 3-4/10  PRECAUTIONS: None  PATIENT GOALS: be able to walk with a cane, reduced pain , improved mobility, and return to swimming  NEXT MD VISIT: 04/25/2022  OBJECTIVE:   PATIENT SURVEYS:  FOTO 26.12   SENSATION: Patient reports intermittent tingling in her right foot since surgery, but none currently  EDEMA:  Circumferential: Right knee (joint line): 52.5 cm Left knee (joint line): 48 cm  PALPATION: TTP: right quadriceps, hip adductors, and lateral joint line  LOWER EXTREMITY ROM:  Active ROM Right eval Left eval  Hip flexion    Hip extension    Hip abduction    Hip adduction    Hip internal rotation    Hip external rotation    Knee flexion 57/ 77 (PROM)  122  Knee extension 17 0  Ankle  dorsiflexion    Ankle plantarflexion    Ankle inversion    Ankle eversion     (Blank rows = not tested)  FUNCTIONAL TESTS:  Significant difficulty with sit to stand and stand to sit transfers while avoiding WB on RLE  GAIT: Assistive device utilized: SPC Level of assistance: Modified independence Comments: Step through pattern with decreased gait speed and stride length   TODAY'S TREATMENT:  DATE:            04/13/22   EXERCISE LOG  Exercise Repetitions and Resistance Comments  Nustep L3 x 15 minutes moving seat forward x 2 to increase flexion.                        In supine: 1-1 right knee PROM into extension x 15 minutes with LLLDS technique utilized f/b elevation with IFC at 80-150 Hz on 40% scan to patient's right Pes Anserine region with Vasopneumatic x 20 minutes  PATIENT EDUCATION:  Education details: HEP, healing, prognosis, plan of care, and goals for therapy Person educated: Patient Education method: Explanation Education comprehension: verbalized understanding  HOME EXERCISE PROGRAM: Reviewed HEP provided by PT at the hospital  ASSESSMENT:  CLINICAL IMPRESSION: Patient with increased pain and tenderness over her right Pes Anserine bursa.  She did well with treatment today.  Encouraged compliance to HEP and esp work on extension. OBJECTIVE IMPAIRMENTS: Abnormal gait, decreased activity tolerance, decreased balance, decreased mobility, difficulty walking, decreased ROM, decreased strength, hypomobility, increased edema, impaired flexibility, impaired sensation, and pain.   ACTIVITY LIMITATIONS: carrying, lifting, bending, standing, squatting, sleeping, stairs, transfers, bed mobility, bathing, dressing, and locomotion level  PARTICIPATION LIMITATIONS: meal prep, cleaning, laundry, driving, shopping, community activity, occupation, yard work,  and church  PERSONAL FACTORS: Transportation and 3+ comorbidities: Hypertension, diabetes, history of seizures, osteoarthritis, chronic low back pain, depression, asthma, allergies, and anxiety  are also affecting patient's functional outcome.   REHAB POTENTIAL: Good  CLINICAL DECISION MAKING: Evolving/moderate complexity  EVALUATION COMPLEXITY: Moderate  GOALS: Goals reviewed with patient? Yes  SHORT TERM GOALS: Target date: 04/12/22 Patient will be independent with her initial HEP. Baseline: Goal status: IN PROGRESS  2.  Patient will be able to demonstrate at least 90 degrees of active right knee flexion for improved knee mobility. Baseline: 3/11: 79 degrees Goal status: IN PROGRESS  3.  Patient will be able to demonstrate active right knee extension within 10 degrees of neutral for improved gait mechanics. Baseline: 3/11: -16 degrees Goal status: IN PROGRESS  4.  Patient will be able to transfer from sitting to standing with equal weight distribution throughout her lower extremities for improved functional transfers. Baseline:  Goal status: MET  LONG TERM GOALS: Target date: 05/03/22  Patient will be independent with her advanced HEP. Baseline:  Goal status: IN PROGRESS  2.  Patient will be able to demonstrate at least 120 degrees of active right knee flexion for improved function navigating stairs. Baseline:  Goal status: IN PROGRESS  3.  Patient will be able to demonstrate active right knee extension within 5 degrees of neutral for improved gait mechanics. Baseline:  Goal status: IN PROGRESS  4.  Patient will be able to navigate at least 3 steps with a reciprocal pattern for improved function with household mobility. Baseline:  Goal status: IN PROGRESS  5.  Patient will be able to safely ambulate with a cane or the least restrictive assistive device for improved mobility. Baseline:  Goal status: IN PROGRESS  PLAN:  PT FREQUENCY: 2-3x/week  PT DURATION: 6  weeks  PLANNED INTERVENTIONS: Therapeutic exercises, Therapeutic activity, Neuromuscular re-education, Balance training, Gait training, Patient/Family education, Self Care, Joint mobilization, Stair training, Electrical stimulation, Cryotherapy, Moist heat, Vasopneumatic device, Manual therapy, and Re-evaluation  PLAN FOR NEXT SESSION: nustep, heel slides, quad sets, SLR, PROM, and modalities as needed  Yvette Roark, [place], PT 04/13/2022, 1:18 PM

## 2022-04-16 ENCOUNTER — Other Ambulatory Visit: Payer: Self-pay | Admitting: Family Medicine

## 2022-04-16 ENCOUNTER — Ambulatory Visit: Payer: Medicare Other

## 2022-04-16 DIAGNOSIS — M25661 Stiffness of right knee, not elsewhere classified: Secondary | ICD-10-CM

## 2022-04-16 DIAGNOSIS — M25561 Pain in right knee: Secondary | ICD-10-CM | POA: Diagnosis not present

## 2022-04-16 DIAGNOSIS — R600 Localized edema: Secondary | ICD-10-CM

## 2022-04-16 NOTE — Therapy (Signed)
OUTPATIENT PHYSICAL THERAPY LOWER EXTREMITY TREATMENT   Patient Name: Dawn Salas MRN: [id number] DOB:Nov 11, 1940, 82 y.o., female Today's Date: 04/16/2022  END OF SESSION:  PT End of Session - 04/16/22 1347     Visit Number 8    Number of Visits 12    Date for PT Re-Evaluation 05/18/22    PT Start Time 1345    PT Stop Time 1440    PT Time Calculation (min) 55 min    Activity Tolerance Patient tolerated treatment well    Behavior During Therapy [facility] for tasks assessed/performed            Past Medical History:  Diagnosis Date   Allergy    Anxiety    Arthritis    Asthma    patient denies    Cataract    bilateral lens implants    Colon polyps    Depression    Diabetes mellitus    Type two   Diverticulitis    Double vision 2019   left eye   Family history of adverse reaction to anesthesia    daughter- N/V    Family history of ovarian cancer    Family history of pancreatic cancer    Frequency of urination    GERD (gastroesophageal reflux disease)    History of adverse reaction to anesthesia 03/19/2022   After regional block for TKA pt states she had uncontrollable frequent blinking and "zoned out" was unable to speak more than one or two words for about 10 minutes   History of hiatal hernia    Hyperglycemia    Hyperlipidemia    Hypertension    Hyperthyroidism    PT HAD BIPOSY -NEGATIVE.Marland Kitchen AND NOW JUST GETS CHECKED EACH YEAR .Marland Kitchen 2013 LAST TEST   SEES DR. [name].    Multiple meningiomas of spine and brain ([place])    Obstructive sleep apnea    STUDY [facility] DOES NOT USE C PAP   Palpitations    Peptic ulcer disease    Pneumonia    "walking pneumonia" hx of   Seizures ([place]) 2015   one siezure with brain surgery none since 2015    Sleep apnea    no CPAP   Urinary tract infection 03/2020   Vertigo    Past Surgical History:  Procedure Laterality Date   ABDOMINAL HYSTERECTOMY     ANTERIOR CERVICAL DECOMP/DISCECTOMY FUSION N/A 04/19/2021   Procedure: Anterior  Cervical Discectomy and Fusion with Interbody Prosthesis, Plates/Screws Cervical Three-Four/Cervical Four-Five REMoval CERVICAL PLATE;  Surgeon: Newman Pies, MD;  Location: [place];  Service: Neurosurgery;  Laterality: N/A;  3C   BACK SURGERY     L SPIN 2003   NECK 2004   BRAIN MENINGIOMA EXCISION  08/2008   X 2   C5-C6 neck fusion     COLONOSCOPY  05/05/2009   pt states multiple polyps removed in both colonoscopies she has had   CRANIOTOMY  08/13/2011   Procedure: CRANIOTOMY TUMOR EXCISION;  Surgeon: [name], MD;  Location: [place] [facility];  Service: Neurosurgery;  Laterality: Bilateral;  Bifrontal craniotomy for tumor   DILATION AND CURETTAGE OF UTERUS     YEARS AGO   L4-L5 posterior la  2021   L3-L5 fusion   left foot plantar and hammertoe     right foot bunectomy     right knee arthroscopy     x 3   right shoulder rotator cuff     ROBOTIC ASSISTED BILATERAL SALPINGO OOPHERECTOMY N/A 08/25/2015  Procedure: XI ROBOTIC ASSISTED BILATERAL SALPINGO OOPHORECTOMY;  Surgeon: [name], MD;  Location[facility];  Service: Gynecology;  Laterality: N/A;   TONSILLECTOMY     TOTAL KNEE ARTHROPLASTY Right 03/19/2022   Procedure: TOTAL KNEE ARTHROPLASTY;  Surgeon: Gaynelle Arabian, MD;  Location: [facility];  Service: Orthopedics;  Laterality: Right;   TUBAL LIGATION     WRIST SURGERY Right    Patient Active Problem List   Diagnosis Date Noted   OA (osteoarthritis) of knee 03/19/2022   Cervical spondylosis with myelopathy and radiculopathy 04/19/2021   Genetic testing 08/01/2020   Family history of ovarian cancer 07/14/2020   Colon polyps 07/14/2020   Family history of pancreatic cancer 07/14/2020   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   CKD (chronic kidney disease) stage 3, GFR 30-59 ml/min (HCC) 10/01/2017   History of normocytic normochromic anemia 11/25/2015   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    Spondylolisthesis of lumbar region 12/23/2013   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   Hyperthyroidism 05/25/2013   Obesity (BMI 30-39.9) 12/30/2012   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[place]) 08/21/2011   Meningioma ([place]) 08/13/2011   DIZZINESS 11/02/2009   Type 2 diabetes mellitus, controlled ([place]) 05/20/2009   HYPOKALEMIA 05/02/2009   DEPRESSION 06/24/2008   GERD 06/24/2008   PEPTIC ULCER DISEASE 06/24/2008   TMJ SYNDROME 06/09/2008   Hyperlipemia 11/08/2006   OBSTRUCTIVE SLEEP APNEA 11/08/2006   Essential hypertension 11/08/2006   ALLERGIC RHINITIS 11/08/2006   VOCAL CORD DISORDER 11/08/2006   ASTHMA 11/08/2006   TACHYARRHYTHMIA 11/08/2006   PCP: Eulas Post, MD  REFERRING PROVIDER: Jearld Lesch, PA   REFERRING DIAG: Unilateral primary osteoarthritis, right knee   THERAPY DIAG:  Acute pain of right knee  Stiffness of right knee, not elsewhere classified  Localized edema  Rationale for Evaluation and Treatment: Rehabilitation  ONSET DATE: 03/19/22  SUBJECTIVE:   SUBJECTIVE STATEMENT:  Pt denies any pain today, but does report right knee stiffness.    PERTINENT HISTORY: Hypertension, diabetes, history of seizures, osteoarthritis, chronic low back pain, depression, asthma, allergies, and anxiety  PAIN: Are you having pain? No  PRECAUTIONS: None  PATIENT GOALS: be able to walk with a cane, reduced pain , improved mobility, and return to swimming  NEXT MD VISIT: 04/25/2022  OBJECTIVE:   PATIENT SURVEYS:  FOTO 26.12   SENSATION: Patient reports intermittent tingling in her right foot since surgery, but none currently  EDEMA:  Circumferential: Right knee (joint line): 52.5 cm Left knee (joint line): 48 cm  PALPATION: TTP: right quadriceps, hip adductors, and lateral joint line  LOWER EXTREMITY ROM:  Active ROM Right eval Left eval  Hip flexion    Hip extension    Hip abduction    Hip adduction    Hip internal rotation    Hip external  rotation    Knee flexion 57/ 77 (PROM)  122  Knee extension 17 0  Ankle dorsiflexion    Ankle plantarflexion    Ankle inversion    Ankle eversion     (Blank rows = not tested)  FUNCTIONAL TESTS:  Significant difficulty with sit to stand and stand to sit transfers while avoiding WB on RLE  GAIT: Assistive device utilized: SPC Level of assistance: Modified independence Comments: Step through pattern with decreased gait speed and stride length   TODAY'S TREATMENT:  DATE:            04/16/22   EXERCISE LOG  Exercise Repetitions and Resistance Comments  Nustep L3 x 15 minutes    Lunges 14" box x 3 mins   Forward Step Ups 6" x 20 reps   Rockerboard X3 mins   Static Extension Stretch 2# x 3 mins; zero knee             Modalities  Date:  Unattended Estim: Knee, IFC 80-150 Hz, 15 mins, Pain Vaso: Knee, 34 degrees, low pressure, 15 mins, Pain and Edema   PATIENT EDUCATION:  Education details: HEP, healing, prognosis, plan of care, and goals for therapy Person educated: Patient Education method: Explanation Education comprehension: verbalized understanding  HOME EXERCISE PROGRAM: Reviewed HEP provided by PT at the hospital  ASSESSMENT:  CLINICAL IMPRESSION: Pt arrives for today's treatment session denying any pain, but does report stiffness.  Pt reports using zero knee at home and has gotten up to a 10 min stretch at this time.  Pt encouraged to add weight to static stretch at home.  Normal responses to estim and vaso noted upon removal.  Pt denied any pain at completion of today's treatment session.   OBJECTIVE IMPAIRMENTS: Abnormal gait, decreased activity tolerance, decreased balance, decreased mobility, difficulty walking, decreased ROM, decreased strength, hypomobility, increased edema, impaired flexibility, impaired sensation, and pain.   ACTIVITY  LIMITATIONS: carrying, lifting, bending, standing, squatting, sleeping, stairs, transfers, bed mobility, bathing, dressing, and locomotion level  PARTICIPATION LIMITATIONS: meal prep, cleaning, laundry, driving, shopping, community activity, occupation, yard work, and church  PERSONAL FACTORS: Transportation and 3+ comorbidities: Hypertension, diabetes, history of seizures, osteoarthritis, chronic low back pain, depression, asthma, allergies, and anxiety  are also affecting patient's functional outcome.   REHAB POTENTIAL: Good  CLINICAL DECISION MAKING: Evolving/moderate complexity  EVALUATION COMPLEXITY: Moderate  GOALS: Goals reviewed with patient? Yes  SHORT TERM GOALS: Target date: 04/12/22 Patient will be independent with her initial HEP. Baseline: Goal status: IN PROGRESS  2.  Patient will be able to demonstrate at least 90 degrees of active right knee flexion for improved knee mobility. Baseline: 3/11: 79 degrees Goal status: IN PROGRESS  3.  Patient will be able to demonstrate active right knee extension within 10 degrees of neutral for improved gait mechanics. Baseline: 3/11: -16 degrees Goal status: IN PROGRESS  4.  Patient will be able to transfer from sitting to standing with equal weight distribution throughout her lower extremities for improved functional transfers. Baseline:  Goal status: MET  LONG TERM GOALS: Target date: 05/03/22  Patient will be independent with her advanced HEP. Baseline:  Goal status: IN PROGRESS  2.  Patient will be able to demonstrate at least 120 degrees of active right knee flexion for improved function navigating stairs. Baseline:  Goal status: IN PROGRESS  3.  Patient will be able to demonstrate active right knee extension within 5 degrees of neutral for improved gait mechanics. Baseline:  Goal status: IN PROGRESS  4.  Patient will be able to navigate at least 3 steps with a reciprocal pattern for improved function with household  mobility. Baseline:  Goal status: IN PROGRESS  5.  Patient will be able to safely ambulate with a cane or the least restrictive assistive device for improved mobility. Baseline:  Goal status: IN PROGRESS  PLAN:  PT FREQUENCY: 2-3x/week  PT DURATION: 6 weeks  PLANNED INTERVENTIONS: Therapeutic exercises, Therapeutic activity, Neuromuscular re-education, Balance training, Gait training, Patient/Family education, Self Care, Joint mobilization,  Stair training, Electrical stimulation, Cryotherapy, Moist heat, Vasopneumatic device, Manual therapy, and Re-evaluation  PLAN FOR NEXT SESSION: nustep, heel slides, quad sets, SLR, PROM, and modalities as needed  Kathrynn Ducking, PTA 04/16/2022, 2:40 PM

## 2022-04-19 ENCOUNTER — Encounter: Payer: Self-pay | Admitting: Physical Therapy

## 2022-04-19 ENCOUNTER — Ambulatory Visit: Payer: Medicare Other | Admitting: Physical Therapy

## 2022-04-19 DIAGNOSIS — M25661 Stiffness of right knee, not elsewhere classified: Secondary | ICD-10-CM

## 2022-04-19 DIAGNOSIS — R600 Localized edema: Secondary | ICD-10-CM

## 2022-04-19 DIAGNOSIS — M25561 Pain in right knee: Secondary | ICD-10-CM | POA: Diagnosis not present

## 2022-04-19 NOTE — Therapy (Addendum)
OUTPATIENT PHYSICAL THERAPY LOWER EXTREMITY TREATMENT   Patient Name: Dawn Salas MRN: [id number] DOB:08/12/1940, 82 y.o., female Today's Date: 04/19/2022  END OF SESSION:  PT End of Session - 04/19/22 1345     Visit Number 9    Number of Visits 12    Date for PT Re-Evaluation 05/18/22    PT Start Time 1347    PT Stop Time 1430    PT Time Calculation (min) 43 min    Equipment Utilized During Treatment Other (comment)   [place]   Activity Tolerance Patient tolerated treatment well    Behavior During Therapy [facility] for tasks assessed/performed            Past Medical History:  Diagnosis Date   Allergy    Anxiety    Arthritis    Asthma    patient denies    Cataract    bilateral lens implants    Colon polyps    Depression    Diabetes mellitus    Type two   Diverticulitis    Double vision 2019   left eye   Family history of adverse reaction to anesthesia    daughter- N/V    Family history of ovarian cancer    Family history of pancreatic cancer    Frequency of urination    GERD (gastroesophageal reflux disease)    History of adverse reaction to anesthesia 03/19/2022   After regional block for TKA pt states she had uncontrollable frequent blinking and "zoned out" was unable to speak more than one or two words for about 10 minutes   History of hiatal hernia    Hyperglycemia    Hyperlipidemia    Hypertension    Hyperthyroidism    PT HAD BIPOSY -NEGATIVE.Marland Kitchen AND NOW JUST GETS CHECKED EACH YEAR .Marland Kitchen 2013 LAST TEST   SEES DR. [name].    Multiple meningiomas of spine and brain ([place])    Obstructive sleep apnea    [facility]UDY [facility] DOES NOT USE [facility] PAP   Palpitations    Peptic ulcer disease    Pneumonia    "walking pneumonia" hx of   Seizures ([place]) 2015   one siezure with brain surgery none since 2015    Sleep apnea    no CPAP   Urinary tract infection 03/2020   Vertigo    Past Surgical History:  Procedure Laterality Date   ABDOMINAL HYSTERECTOMY     ANTERIOR CERVICAL  DECOMP/DISCECTOMY FUSION N/A 04/19/2021   Procedure: Anterior Cervical Discectomy and Fusion with Interbody Prosthesis, Plates/Screws Cervical Three-Four/Cervical Four-Five REMoval CERVICAL PLATE;  Surgeon: Newman Pies, MD;  Location: [place];  Service: Neurosurgery;  Laterality: N/A;  3C   BACK SURGERY     L SPIN 2003   NECK 2004   BRAIN MENINGIOMA EXCISION  08/2008   X 2   C5-C6 neck fusion     COLONOSCOPY  05/05/2009   pt states multiple polyps removed in both colonoscopies she has had   CRANIOTOMY  08/13/2011   Procedure: CRANIOTOMY TUMOR EXCISION;  Surgeon: [name], MD;  Location: [place] [facility];  Service: Neurosurgery;  Laterality: Bilateral;  Bifrontal craniotomy for tumor   DILATION AND CURETTAGE OF UTERUS     YEARS AGO   L4-L5 posterior la  2021   L3-L5 fusion   left foot plantar and hammertoe     right foot bunectomy     right knee arthroscopy     x 3   right shoulder rotator cuff  ROBOTIC ASSISTED BILATERAL SALPINGO OOPHERECTOMY N/A 08/25/2015   Procedure: XI ROBOTIC ASSISTED BILATERAL SALPINGO OOPHORECTOMY;  Surgeon: [name], MD;  [facility];  Service: Gynecology;  Laterality: N/A;   TONSILLECTOMY     TOTAL KNEE ARTHROPLASTY Right 03/19/2022   Procedure: TOTAL KNEE ARTHROPLASTY;  Surgeon: Gaynelle Arabian, MD;  [facility];  Service: Orthopedics;  Laterality: Right;   TUBAL LIGATION     WRIST SURGERY Right    Patient Active Problem List   Diagnosis Date Noted   OA (osteoarthritis) of knee 03/19/2022   Cervical spondylosis with myelopathy and radiculopathy 04/19/2021   Genetic testing 08/01/2020   Family history of ovarian cancer 07/14/2020   Colon polyps 07/14/2020   Family history of pancreatic cancer 07/14/2020   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   CKD (chronic kidney disease) stage 3, GFR 30-59 ml/min (HCC) 10/01/2017   History of normocytic normochromic anemia 11/25/2015   Multinodular goiter  (nontoxic) 03/21/2015   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   Spondylolisthesis of lumbar region 12/23/2013   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   Hyperthyroidism 05/25/2013   Obesity (BMI 30-39.9) 12/30/2012   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[place]) 08/21/2011   Meningioma ([place]) 08/13/2011   DIZZINESS 11/02/2009   Type 2 diabetes mellitus, controlled ([place]) 05/20/2009   HYPOKALEMIA 05/02/2009   DEPRESSION 06/24/2008   GERD 06/24/2008   PEPTIC ULCER DISEASE 06/24/2008   TMJ SYNDROME 06/09/2008   Hyperlipemia 11/08/2006   OBSTRUCTIVE SLEEP APNEA 11/08/2006   Essential hypertension 11/08/2006   ALLERGIC RHINITIS 11/08/2006   VOCAL CORD DISORDER 11/08/2006   ASTHMA 11/08/2006   TACHYARRHYTHMIA 11/08/2006   PCP: Eulas Post, MD  REFERRING PROVIDER: Jearld Lesch, PA   REFERRING DIAG: Unilateral primary osteoarthritis, right knee   THERAPY DIAG:  Acute pain of right knee  Stiffness of right knee, not elsewhere classified  Localized edema  Rationale for Evaluation and Treatment: Rehabilitation  ONSET DATE: 03/19/22  SUBJECTIVE:   SUBJECTIVE STATEMENT:  Reports stiffness with sitting for prolonged periods but loosens.  PERTINENT HISTORY: Hypertension, diabetes, history of seizures, osteoarthritis, chronic low back pain, depression, asthma, allergies, and anxiety  PAIN: Are you having pain? No  PRECAUTIONS: None  PATIENT GOALS: be able to walk with a cane, reduced pain , improved mobility, and return to swimming  NEXT MD VISIT: 04/25/2022  OBJECTIVE:   PATIENT SURVEYS:  FOTO 63  EDEMA:  Circumferential: Right knee (joint line): 52.5 cm Left knee (joint line): 48 cm  PALPATION: TTP: right quadriceps, hip adductors, and lateral joint line  LOWER EXTREMITY ROM:  Active ROM Right eval Left eval  Hip flexion    Hip extension    Hip abduction    Hip adduction    Hip internal rotation    Hip external rotation    Knee flexion 57/ 77 (PROM)  122   Knee extension 17 0  Ankle dorsiflexion    Ankle plantarflexion    Ankle inversion    Ankle eversion     (Blank rows = not tested)  FUNCTIONAL TESTS:  Significant difficulty with sit to stand and stand to sit transfers while avoiding WB on RLE  GAIT: Assistive device utilized: SPC Level of assistance: Modified independence Comments: Step through pattern with decreased gait speed and stride length   TODAY'S TREATMENT:  DATE:            04/19/22   EXERCISE LOG  Exercise Repetitions and Resistance Comments  Nustep L3 x 15 minutes    Lunges 14" box x 3 mins   Knee extensors 10# x20 reps   Knee flexors 30# x20 reps   Lunges X20 reps            Manual Therapy Passive ROM: R knee, to improve knee extension with light overpressure   Modalities  Date: 04/19/2022 Vaso: Knee, Low, 10 mins, Pain and Edema  PATIENT EDUCATION:  Education details: HEP, healing, prognosis, plan of care, and goals for therapy Person educated: Patient Education method: Explanation Education comprehension: verbalized understanding  HOME EXERCISE PROGRAM: Reviewed HEP provided by PT at the hospital  ASSESSMENT:  CLINICAL IMPRESSION: Patient presented in clinic with only reports of stiffness. Patient able to tolerate progression to machine strengthening. Knee flexion still notable with stance phase of gait and at rest. PROM into knee extension with light overpressure completed as well. Increased inferior knee edema noted in R knee as well. Normal vasopnuematic response noted following removal of the modality.  OBJECTIVE IMPAIRMENTS: Abnormal gait, decreased activity tolerance, decreased balance, decreased mobility, difficulty walking, decreased ROM, decreased strength, hypomobility, increased edema, impaired flexibility, impaired sensation, and pain.   ACTIVITY LIMITATIONS:  carrying, lifting, bending, standing, squatting, sleeping, stairs, transfers, bed mobility, bathing, dressing, and locomotion level  PARTICIPATION LIMITATIONS: meal prep, cleaning, laundry, driving, shopping, community activity, occupation, yard work, and church  PERSONAL FACTORS: Transportation and 3+ comorbidities: Hypertension, diabetes, history of seizures, osteoarthritis, chronic low back pain, depression, asthma, allergies, and anxiety  are also affecting patient's functional outcome.   REHAB POTENTIAL: Good  CLINICAL DECISION MAKING: Evolving/moderate complexity  EVALUATION COMPLEXITY: Moderate  GOALS: Goals reviewed with patient? Yes  SHORT TERM GOALS: Target date: 04/12/22 Patient will be independent with her initial HEP. Baseline: Goal status: IN PROGRESS  2.  Patient will be able to demonstrate at least 90 degrees of active right knee flexion for improved knee mobility. Baseline: 3/11: 79 degrees Goal status: IN PROGRESS  3.  Patient will be able to demonstrate active right knee extension within 10 degrees of neutral for improved gait mechanics. Baseline: 3/11: -16 degrees Goal status: IN PROGRESS  4.  Patient will be able to transfer from sitting to standing with equal weight distribution throughout her lower extremities for improved functional transfers. Baseline:  Goal status: MET  LONG TERM GOALS: Target date: 05/03/22  Patient will be independent with her advanced HEP. Baseline:  Goal status: IN PROGRESS  2.  Patient will be able to demonstrate at least 120 degrees of active right knee flexion for improved function navigating stairs. Baseline:  Goal status: IN PROGRESS  3.  Patient will be able to demonstrate active right knee extension within 5 degrees of neutral for improved gait mechanics. Baseline:  Goal status: IN PROGRESS  4.  Patient will be able to navigate at least 3 steps with a reciprocal pattern for improved function with household  mobility. Baseline:  Goal status: IN PROGRESS  5.  Patient will be able to safely ambulate with a cane or the least restrictive assistive device for improved mobility. Baseline:  Goal status: IN PROGRESS  PLAN:  PT FREQUENCY: 2-3x/week  PT DURATION: 6 weeks  PLANNED INTERVENTIONS: Therapeutic exercises, Therapeutic activity, Neuromuscular re-education, Balance training, Gait training, Patient/Family education, Self Care, Joint mobilization, Stair training, Electrical stimulation, Cryotherapy, Moist heat, Vasopneumatic device, Manual therapy, and Re-evaluation  PLAN FOR NEXT SESSION: nustep, heel slides, quad sets, SLR, PROM, and modalities as needed  Standley Brooking, PTA 04/19/2022, 3:33 PM

## 2022-04-23 ENCOUNTER — Ambulatory Visit: Payer: Medicare Other | Admitting: Physical Therapy

## 2022-04-23 ENCOUNTER — Other Ambulatory Visit: Payer: Self-pay | Admitting: Family Medicine

## 2022-04-26 ENCOUNTER — Ambulatory Visit: Payer: Medicare Other | Attending: Student

## 2022-04-26 DIAGNOSIS — M25661 Stiffness of right knee, not elsewhere classified: Secondary | ICD-10-CM | POA: Diagnosis present

## 2022-04-26 DIAGNOSIS — R600 Localized edema: Secondary | ICD-10-CM | POA: Diagnosis present

## 2022-04-26 DIAGNOSIS — M25561 Pain in right knee: Secondary | ICD-10-CM | POA: Insufficient documentation

## 2022-04-26 NOTE — Therapy (Addendum)
OUTPATIENT PHYSICAL THERAPY LOWER EXTREMITY TREATMENT   Patient Name: Dawn Salas MRN: [id number] DOB:February 28, 1940, 82 y.o., female Today's Date: 04/26/2022  END OF SESSION:  PT End of Session - 04/26/22 1349     Visit Number 10    Number of Visits 12    Date for PT Re-Evaluation 05/18/22    PT Start Time 1345    PT Stop Time 1430    PT Time Calculation (min) 45 min    Equipment Utilized During Treatment Other (comment)   [place]   Activity Tolerance Patient tolerated treatment well    Behavior During Therapy [facility] for tasks assessed/performed            Past Medical History:  Diagnosis Date   Allergy    Anxiety    Arthritis    Asthma    patient denies    Cataract    bilateral lens implants    Colon polyps    Depression    Diabetes mellitus    Type two   Diverticulitis    Double vision 2019   left eye   Family history of adverse reaction to anesthesia    daughter- N/V    Family history of ovarian cancer    Family history of pancreatic cancer    Frequency of urination    GERD (gastroesophageal reflux disease)    History of adverse reaction to anesthesia 03/19/2022   After regional block for TKA pt states she had uncontrollable frequent blinking and "zoned out" was unable to speak more than one or two words for about 10 minutes   History of hiatal hernia    Hyperglycemia    Hyperlipidemia    Hypertension    Hyperthyroidism    PT HAD BIPOSY -NEGATIVE.Marland Kitchen AND NOW JUST GETS CHECKED EACH YEAR .Marland Kitchen 2013 LAST TEST   SEES DR. [name].    Multiple meningiomas of spine and brain    Obstructive sleep apnea    STUDY [facility] DOES NOT USE C PAP   Palpitations    Peptic ulcer disease    Pneumonia    "walking pneumonia" hx of   Seizures 2015   one siezure with brain surgery none since 2015    Sleep apnea    no CPAP   Urinary tract infection 03/2020   Vertigo    Past Surgical History:  Procedure Laterality Date   ABDOMINAL HYSTERECTOMY     ANTERIOR CERVICAL  DECOMP/DISCECTOMY FUSION N/A 04/19/2021   Procedure: Anterior Cervical Discectomy and Fusion with Interbody Prosthesis, Plates/Screws Cervical Three-Four/Cervical Four-Five REMoval CERVICAL PLATE;  Surgeon: Newman Pies, MD;  Location: [place];  Service: Neurosurgery;  Laterality: N/A;  3C   BACK SURGERY     L SPIN 2003   NECK 2004   BRAIN MENINGIOMA EXCISION  08/2008   X 2   C5-C6 neck fusion     COLONOSCOPY  05/05/2009   pt states multiple polyps removed in both colonoscopies she has had   CRANIOTOMY  08/13/2011   Procedure: CRANIOTOMY TUMOR EXCISION;  Surgeon: [name], MD;  Location: [place];  Service: Neurosurgery;  Laterality: Bilateral;  Bifrontal craniotomy for tumor   DILATION AND CURETTAGE OF UTERUS     YEARS AGO   L4-L5 posterior la  2021   L3-L5 fusion   left foot plantar and hammertoe     right foot bunectomy     right knee arthroscopy     x 3   right shoulder rotator cuff  ROBOTIC ASSISTED BILATERAL SALPINGO OOPHERECTOMY N/A 08/25/2015   Procedure: XI ROBOTIC ASSISTED BILATERAL SALPINGO OOPHORECTOMY;  Surgeon: [name], MD;  Location: [facility];  Service: Gynecology;  Laterality: N/A;   TONSILLECTOMY     TOTAL KNEE ARTHROPLASTY Right 03/19/2022   Procedure: TOTAL KNEE ARTHROPLASTY;  Surgeon: Gaynelle Arabian, MD;  Location: [facility];  Service: Orthopedics;  Laterality: Right;   TUBAL LIGATION     WRIST SURGERY Right    Patient Active Problem List   Diagnosis Date Noted   OA (osteoarthritis) of knee 03/19/2022   Cervical spondylosis with myelopathy and radiculopathy 04/19/2021   Genetic testing 08/01/2020   Family history of ovarian cancer 07/14/2020   Colon polyps 07/14/2020   Family history of pancreatic cancer 07/14/2020   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   CKD (chronic kidney disease) stage 3, GFR 30-59 ml/min 10/01/2017   History of normocytic normochromic anemia 11/25/2015   Multinodular goiter  (nontoxic) 03/21/2015   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   Spondylolisthesis of lumbar region 12/23/2013   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   Hyperthyroidism 05/25/2013   Obesity (BMI 30-39.9) 12/30/2012   Partial seizure disorder 08/21/2011   Meningioma 08/13/2011   DIZZINESS 11/02/2009   Type 2 diabetes mellitus, controlled 05/20/2009   HYPOKALEMIA 05/02/2009   DEPRESSION 06/24/2008   GERD 06/24/2008   PEPTIC ULCER DISEASE 06/24/2008   TMJ SYNDROME 06/09/2008   Hyperlipemia 11/08/2006   OBSTRUCTIVE SLEEP APNEA 11/08/2006   Essential hypertension 11/08/2006   ALLERGIC RHINITIS 11/08/2006   VOCAL CORD DISORDER 11/08/2006   ASTHMA 11/08/2006   TACHYARRHYTHMIA 11/08/2006   PCP: Eulas Post, MD  REFERRING PROVIDER: Jearld Lesch, PA   REFERRING DIAG: Unilateral primary osteoarthritis, right knee   THERAPY DIAG:  Acute pain of right knee  Stiffness of right knee, not elsewhere classified  Localized edema  Rationale for Evaluation and Treatment: Rehabilitation  ONSET DATE: 03/19/22  SUBJECTIVE:   SUBJECTIVE STATEMENT:  Pt denies any pain, but does endorse some minor stiffness.  PERTINENT HISTORY: Hypertension, diabetes, history of seizures, osteoarthritis, chronic low back pain, depression, asthma, allergies, and anxiety  PAIN: Are you having pain? No  PRECAUTIONS: None  PATIENT GOALS: be able to walk with a cane, reduced pain , improved mobility, and return to swimming  NEXT MD VISIT: 04/25/2022  OBJECTIVE:   PATIENT SURVEYS:  FOTO 63  EDEMA:  Circumferential: Right knee (joint line): 52.5 cm Left knee (joint line): 48 cm  PALPATION: TTP: right quadriceps, hip adductors, and lateral joint line  LOWER EXTREMITY ROM:  Active ROM Right eval Left eval  Hip flexion    Hip extension    Hip abduction    Hip adduction    Hip internal rotation    Hip external rotation    Knee flexion 57/ 77 (PROM)  122  Knee extension 17 0   Ankle dorsiflexion    Ankle plantarflexion    Ankle inversion    Ankle eversion     (Blank rows = not tested)  FUNCTIONAL TESTS:  Significant difficulty with sit to stand and stand to sit transfers while avoiding WB on RLE  GAIT: Assistive device utilized: SPC Level of assistance: Modified independence Comments: Step through pattern with decreased gait speed and stride length   TODAY'S TREATMENT:  DATE:            04/26/22   EXERCISE LOG  Exercise Repetitions and Resistance Comments  Nustep L4 x 15 minutes    Lunges 14" box x 3 mins   Knee extensors 10# x25 reps   Knee flexors 40# x25 reps   Prone hang         Manual Therapy Passive ROM: R knee, to improve knee extension with light overpressure    PATIENT EDUCATION:  Education details: HEP, healing, prognosis, plan of care, and goals for therapy Person educated: Patient Education method: Explanation Education comprehension: verbalized understanding  HOME EXERCISE PROGRAM: Reviewed HEP provided by PT at the hospital  ASSESSMENT:  CLINICAL IMPRESSION: Pt arrives for today's treatment session denying any pain, but does endorse stiffness.  Pt able to increase FOTO score to 70 today.  Pt instructed to add prone hang to exercises at home to assist with right knee extension.  Pt reports using zero knee at home multiple times a day.  PROM performed with pt in prone to increase ROM and function.  Pt declined vaso today stating that she would ice at home.  Pt denied any pain at completion of today's treatment session.   04/26/22 PROGRESS REPORT: Patient is making good progress with skilled physical therapy as evidenced by her subjective reports, objective measures, functional mobility, and progress toward her goals. She has met most of her short-term goals for therapy. However, she has yet to meet her long-term  goals with right knee extension being her primary impairment. Recommend that she continue with skilled physical therapy to address her remaining impairments to maximize her functional mobility.  Jacqulynn Cadet, PT, DPT   OBJECTIVE IMPAIRMENTS: Abnormal gait, decreased activity tolerance, decreased balance, decreased mobility, difficulty walking, decreased ROM, decreased strength, hypomobility, increased edema, impaired flexibility, impaired sensation, and pain.   ACTIVITY LIMITATIONS: carrying, lifting, bending, standing, squatting, sleeping, stairs, transfers, bed mobility, bathing, dressing, and locomotion level  PARTICIPATION LIMITATIONS: meal prep, cleaning, laundry, driving, shopping, community activity, occupation, yard work, and church  PERSONAL FACTORS: Transportation and 3+ comorbidities: Hypertension, diabetes, history of seizures, osteoarthritis, chronic low back pain, depression, asthma, allergies, and anxiety  are also affecting patient's functional outcome.   REHAB POTENTIAL: Good  CLINICAL DECISION MAKING: Evolving/moderate complexity  EVALUATION COMPLEXITY: Moderate  GOALS: Goals reviewed with patient? Yes  SHORT TERM GOALS: Target date: 04/12/22 Patient will be independent with her initial HEP. Baseline: Goal status: MET  2.  Patient will be able to demonstrate at least 90 degrees of active right knee flexion for improved knee mobility. Baseline: 3/11: 79 degrees; 4/4: 110 degrees Goal status: MET  3.  Patient will be able to demonstrate active right knee extension within 10 degrees of neutral for improved gait mechanics. Baseline: 3/11: -16 degrees Goal status: IN PROGRESS  4.  Patient will be able to transfer from sitting to standing with equal weight distribution throughout her lower extremities for improved functional transfers. Baseline:  Goal status: MET  LONG TERM GOALS: Target date: 05/03/22  Patient will be independent with her advanced HEP. Baseline:   Goal status: IN PROGRESS  2.  Patient will be able to demonstrate at least 120 degrees of active right knee flexion for improved function navigating stairs. Baseline: 4/4: 110 degrees Goal status: IN PROGRESS  3.  Patient will be able to demonstrate active right knee extension within 5 degrees of neutral for improved gait mechanics. Baseline:  Goal status: IN PROGRESS  4.  Patient  will be able to navigate at least 3 steps with a reciprocal pattern for improved function with household mobility. Baseline:  Goal status: IN PROGRESS  5.  Patient will be able to safely ambulate with a cane or the least restrictive assistive device for improved mobility. Baseline:  Goal status: MET  PLAN:  PT FREQUENCY: 2-3x/week  PT DURATION: 6 weeks  PLANNED INTERVENTIONS: Therapeutic exercises, Therapeutic activity, Neuromuscular re-education, Balance training, Gait training, Patient/Family education, Self Care, Joint mobilization, Stair training, Electrical stimulation, Cryotherapy, Moist heat, Vasopneumatic device, Manual therapy, and Re-evaluation  PLAN FOR NEXT SESSION: nustep, heel slides, quad sets, SLR, PROM, and modalities as needed  Kathrynn Ducking, PTA 04/26/2022, 2:45 PM

## 2022-04-30 ENCOUNTER — Ambulatory Visit: Payer: Medicare Other | Admitting: Physical Therapy

## 2022-04-30 ENCOUNTER — Encounter: Payer: Self-pay | Admitting: Physical Therapy

## 2022-04-30 DIAGNOSIS — M25561 Pain in right knee: Secondary | ICD-10-CM

## 2022-04-30 DIAGNOSIS — M25661 Stiffness of right knee, not elsewhere classified: Secondary | ICD-10-CM

## 2022-04-30 DIAGNOSIS — R600 Localized edema: Secondary | ICD-10-CM

## 2022-04-30 NOTE — Therapy (Signed)
OUTPATIENT PHYSICAL THERAPY LOWER EXTREMITY TREATMENT   Patient Name: Dawn Salas MRN: [id number] DOB:[birth year], 82 y.o., female Today's Date: 04/30/2022  END OF SESSION:  PT End of Session - 04/30/22 1347     Visit Number 11    Number of Visits 12    Date for PT Re-Evaluation 05/18/22    PT Start Time 1345    PT Stop Time 1431    PT Time Calculation (min) 46 min    Activity Tolerance Patient tolerated treatment well    Behavior During Therapy [facility] for tasks assessed/performed            Past Medical History:  Diagnosis Date   Allergy    Anxiety    Arthritis    Asthma    patient denies    Cataract    bilateral lens implants    Colon polyps    Depression    Diabetes mellitus    Type two   Diverticulitis    Double vision 2019   left eye   Family history of adverse reaction to anesthesia    daughter- N/V    Family history of ovarian cancer    Family history of pancreatic cancer    Frequency of urination    GERD (gastroesophageal reflux disease)    History of adverse reaction to anesthesia 03/19/2022   After regional block for TKA pt states she had uncontrollable frequent blinking and "zoned out" was unable to speak more than one or two words for about 10 minutes   History of hiatal hernia    Hyperglycemia    Hyperlipidemia    Hypertension    Hyperthyroidism    PT HAD BIPOSY -NEGATIVE.Marland Kitchen AND NOW JUST GETS CHECKED EACH YEAR .Marland Kitchen 2013 LAST TEST   SEES DR. [name].    Multiple meningiomas of spine and brain    Obstructive sleep apnea    STUDY AT [facility] DOES NOT USE C PAP   Palpitations    Peptic ulcer disease    Pneumonia    "walking pneumonia" hx of   Seizures 2015   one siezure with brain surgery none since 2015    Sleep apnea    no CPAP   Urinary tract infection 03/2020   Vertigo    Past Surgical History:  Procedure Laterality Date   ABDOMINAL HYSTERECTOMY     ANTERIOR CERVICAL DECOMP/DISCECTOMY FUSION N/A 04/19/2021   Procedure: Anterior Cervical  Discectomy and Fusion with Interbody Prosthesis, Plates/Screws Cervical Three-Four/Cervical Four-Five REMoval CERVICAL PLATE;  Surgeon: Tressie Stalker, MD;  [facility];  Service: Neurosurgery;  Laterality: N/A;  3C   BACK SURGERY     L SPIN 2003   NECK 2004   BRAIN MENINGIOMA EXCISION  08/2008   X 2   C5-C6 neck fusion     COLONOSCOPY  05/05/2009   pt states multiple polyps removed in both colonoscopies she has had   CRANIOTOMY  08/13/2011   Procedure: CRANIOTOMY TUMOR EXCISION;  Surgeon: Cristi Loron, MD;  Location: [facility];  Service: Neurosurgery;  Laterality: Bilateral;  Bifrontal craniotomy for tumor   DILATION AND CURETTAGE OF UTERUS     YEARS AGO   L4-L5 posterior la  2021   L3-L5 fusion   left foot plantar and hammertoe     right foot bunectomy     right knee arthroscopy     x 3   right shoulder rotator cuff     ROBOTIC ASSISTED BILATERAL SALPINGO OOPHERECTOMY N/A 08/25/2015  Procedure: XI ROBOTIC ASSISTED BILATERAL SALPINGO OOPHORECTOMY;  Surgeon: Laurette Schimke, MD;  Location: [facility];  Service: Gynecology;  Laterality: N/A;   TONSILLECTOMY     TOTAL KNEE ARTHROPLASTY Right 03/19/2022   Procedure: TOTAL KNEE ARTHROPLASTY;  Surgeon: Ollen Gross, MD;  Location: [facility];  Service: Orthopedics;  Laterality: Right;   TUBAL LIGATION     WRIST SURGERY Right    Patient Active Problem List   Diagnosis Date Noted   OA (osteoarthritis) of knee 03/19/2022   Cervical spondylosis with myelopathy and radiculopathy 04/19/2021   Genetic testing 08/01/2020   Family history of ovarian cancer 07/14/2020   Colon polyps 07/14/2020   Family history of pancreatic cancer 07/14/2020   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   CKD (chronic kidney disease) stage 3, GFR 30-59 ml/min 10/01/2017   History of normocytic normochromic anemia 11/25/2015   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    Spondylolisthesis of lumbar region 12/23/2013   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   Hyperthyroidism 05/25/2013   Obesity (BMI 30-39.9) 12/30/2012   Partial seizure disorder 08/21/2011   Meningioma 08/13/2011   DIZZINESS 11/02/2009   Type 2 diabetes mellitus, controlled 05/20/2009   HYPOKALEMIA 05/02/2009   DEPRESSION 06/24/2008   GERD 06/24/2008   PEPTIC ULCER DISEASE 06/24/2008   TMJ SYNDROME 06/09/2008   Hyperlipemia 11/08/2006   OBSTRUCTIVE SLEEP APNEA 11/08/2006   Essential hypertension 11/08/2006   ALLERGIC RHINITIS 11/08/2006   VOCAL CORD DISORDER 11/08/2006   ASTHMA 11/08/2006   TACHYARRHYTHMIA 11/08/2006   PCP: Kristian Covey, MD  REFERRING PROVIDER: Eartha Inch, PA   REFERRING DIAG: Unilateral primary osteoarthritis, right knee   THERAPY DIAG:  Acute pain of right knee  Stiffness of right knee, not elsewhere classified  Localized edema  Rationale for Evaluation and Treatment: Rehabilitation  ONSET DATE: 03/19/22  SUBJECTIVE:   SUBJECTIVE STATEMENT:  Was carrying her AD in clinic but not using it. Patient reports she has been doing prone hangs at home for up to 6 minutes.  PERTINENT HISTORY: Hypertension, diabetes, history of seizures, osteoarthritis, chronic low back pain, depression, asthma, allergies, and anxiety  PAIN: Are you having pain? No  PRECAUTIONS: None  PATIENT GOALS: be able to walk with a cane, reduced pain , improved mobility, and return to swimming  NEXT MD VISIT: 04/25/2022  OBJECTIVE:   PATIENT SURVEYS:  FOTO 63  EDEMA:  Circumferential: Right knee (joint line): 52.5 cm Left knee (joint line): 48 cm  PALPATION: TTP: right quadriceps, hip adductors, and lateral joint line  LOWER EXTREMITY ROM:  Active ROM Right eval Left eval Right 04/30/22  Hip flexion     Hip extension     Hip abduction     Hip adduction     Hip internal rotation     Hip external rotation     Knee flexion 57/ 77 (PROM)  122   Knee  extension 17 0 9  Ankle dorsiflexion     Ankle plantarflexion     Ankle inversion     Ankle eversion      (Blank rows = not tested)  GAIT: Assistive device utilized: SPC Level of assistance: Modified independence Comments: Step through pattern with decreased gait speed and stride length  TODAY'S TREATMENT:  DATE:            04/30/22   EXERCISE LOG  Exercise Repetitions and Resistance Comments  Nustep L3 x 17 minutes , seat 8   Knee extensors 20# x30 reps   Knee flexors 40# x30 reps            Manual Therapy Passive ROM: R knee, to improve knee extension with light overpressure with STW to HS  Modalities  Date: 04/30/22 Vaso: Knee, Low, 10 mins, Pain and Edema  PATIENT EDUCATION:  Education details: HEP, healing, prognosis, plan of care, and goals for therapy Person educated: Patient Education method: Explanation Education comprehension: verbalized understanding  HOME EXERCISE PROGRAM: Reviewed HEP provided by PT at the hospital Prone hangs  ASSESSMENT:  CLINICAL IMPRESSION: Patient presented in clinic with reports of being diligent with prone hangs at home and still very diligent with other exercises. Patient fatigued somewhat with therex session as she stated she had done her exercises prior to coming to PT. Patient observed with equal WB with low sit to stands. Patient able to demonstrate improved knee extension and achieved STG for knee extension. Normal vasopneumatic response noted following removal of the modality.  OBJECTIVE IMPAIRMENTS: Abnormal gait, decreased activity tolerance, decreased balance, decreased mobility, difficulty walking, decreased ROM, decreased strength, hypomobility, increased edema, impaired flexibility, impaired sensation, and pain.   ACTIVITY LIMITATIONS: carrying, lifting, bending, standing, squatting, sleeping, stairs,  transfers, bed mobility, bathing, dressing, and locomotion level  PARTICIPATION LIMITATIONS: meal prep, cleaning, laundry, driving, shopping, community activity, occupation, yard work, and church  PERSONAL FACTORS: Transportation and 3+ comorbidities: Hypertension, diabetes, history of seizures, osteoarthritis, chronic low back pain, depression, asthma, allergies, and anxiety  are also affecting patient's functional outcome.   REHAB POTENTIAL: Good  CLINICAL DECISION MAKING: Evolving/moderate complexity  EVALUATION COMPLEXITY: Moderate  GOALS: Goals reviewed with patient? Yes  SHORT TERM GOALS: Target date: 04/12/22 Patient will be independent with her initial HEP. Baseline: Goal status: MET  2.  Patient will be able to demonstrate at least 90 degrees of active right knee flexion for improved knee mobility. Baseline: 3/11: 79 degrees; 4/4: 110 degrees Goal status: MET  3.  Patient will be able to demonstrate active right knee extension within 10 degrees of neutral for improved gait mechanics. Baseline: 3/11: -16 degrees Goal status: MET  4.  Patient will be able to transfer from sitting to standing with equal weight distribution throughout her lower extremities for improved functional transfers. Baseline:  Goal status: MET  LONG TERM GOALS: Target date: 05/03/22  Patient will be independent with her advanced HEP. Baseline:  Goal status: MET  2.  Patient will be able to demonstrate at least 120 degrees of active right knee flexion for improved function navigating stairs. Baseline: 4/4: 110 degrees Goal status: IN PROGRESS  3.  Patient will be able to demonstrate active right knee extension within 5 degrees of neutral for improved gait mechanics. Baseline:  Goal status: IN PROGRESS  4.  Patient will be able to navigate at least 3 steps with a reciprocal pattern for improved function with household mobility. Baseline:  Goal status: IN PROGRESS  5.  Patient will be able to  safely ambulate with a cane or the least restrictive assistive device for improved mobility. Baseline:  Goal status: MET  PLAN:  PT FREQUENCY: 2-3x/week  PT DURATION: 6 weeks  PLANNED INTERVENTIONS: Therapeutic exercises, Therapeutic activity, Neuromuscular re-education, Balance training, Gait training, Patient/Family education, Self Care, Joint mobilization, Stair training, Electrical stimulation, Cryotherapy,  Moist heat, Vasopneumatic device, Manual therapy, and Re-evaluation  PLAN FOR NEXT SESSION: nustep, heel slides, quad sets, SLR, PROM, and modalities as needed  Marvell Fuller, PTA 04/30/2022, 2:37 PM

## 2022-05-03 ENCOUNTER — Ambulatory Visit: Payer: Medicare Other | Admitting: Physical Therapy

## 2022-05-03 ENCOUNTER — Encounter: Payer: Self-pay | Admitting: Physical Therapy

## 2022-05-03 DIAGNOSIS — M25661 Stiffness of right knee, not elsewhere classified: Secondary | ICD-10-CM

## 2022-05-03 DIAGNOSIS — M25561 Pain in right knee: Secondary | ICD-10-CM | POA: Diagnosis not present

## 2022-05-03 DIAGNOSIS — R600 Localized edema: Secondary | ICD-10-CM

## 2022-05-03 NOTE — Therapy (Signed)
OUTPATIENT PHYSICAL THERAPY LOWER EXTREMITY TREATMENT   Patient Name: Dawn Salas MRN: [id number] DOB:[birth year], 82 y.o., female Today's Date: 05/03/2022  END OF SESSION:  PT End of Session - 05/03/22 1337     Visit Number 12    Number of Visits 12    Date for PT Re-Evaluation 05/18/22    PT Start Time 1346    PT Stop Time 1435    PT Time Calculation (min) 49 min    Activity Tolerance Patient tolerated treatment well    Behavior During Therapy [facility] for tasks assessed/performed            Past Medical History:  Diagnosis Date   Allergy    Anxiety    Arthritis    Asthma    patient denies    Cataract    bilateral lens implants    Colon polyps    Depression    Diabetes mellitus    Type two   Diverticulitis    Double vision 2019   left eye   Family history of adverse reaction to anesthesia    daughter- N/V    Family history of ovarian cancer    Family history of pancreatic cancer    Frequency of urination    GERD (gastroesophageal reflux disease)    History of adverse reaction to anesthesia 03/19/2022   After regional block for TKA pt states she had uncontrollable frequent blinking and "zoned out" was unable to speak more than one or two words for about 10 minutes   History of hiatal hernia    Hyperglycemia    Hyperlipidemia    Hypertension    Hyperthyroidism    PT HAD BIPOSY -NEGATIVE.Marland Kitchen AND NOW JUST GETS CHECKED EACH YEAR .Marland Kitchen 2013 LAST TEST   SEES DR. [name].    Multiple meningiomas of spine and brain    Obstructive sleep apnea    STUDY [facility] DOES NOT USE C PAP   Palpitations    Peptic ulcer disease    Pneumonia    "walking pneumonia" hx of   Seizures 2015   one siezure with brain surgery none since 2015    Sleep apnea    no CPAP   Urinary tract infection 03/2020   Vertigo    Past Surgical History:  Procedure Laterality Date   ABDOMINAL HYSTERECTOMY     ANTERIOR CERVICAL DECOMP/DISCECTOMY FUSION N/A 04/19/2021   Procedure: Anterior Cervical  Discectomy and Fusion with Interbody Prosthesis, Plates/Screws Cervical Three-Four/Cervical Four-Five REMoval CERVICAL PLATE;  Surgeon: Tressie Stalker, MD;  Location: [facility];  Service: Neurosurgery;  Laterality: N/A;  3C   BACK SURGERY     L SPIN 2003   NECK 2004   BRAIN MENINGIOMA EXCISION  08/2008   X 2   C5-C6 neck fusion     COLONOSCOPY  05/05/2009   pt states multiple polyps removed in both colonoscopies she has had   CRANIOTOMY  08/13/2011   Procedure: CRANIOTOMY TUMOR EXCISION;  Surgeon: Cristi Loron, MD;  Location: [facility];  Service: Neurosurgery;  Laterality: Bilateral;  Bifrontal craniotomy for tumor   DILATION AND CURETTAGE OF UTERUS     YEARS AGO   L4-L5 posterior la  2021   L3-L5 fusion   left foot plantar and hammertoe     right foot bunectomy     right knee arthroscopy     x 3   right shoulder rotator cuff     ROBOTIC ASSISTED BILATERAL SALPINGO OOPHERECTOMY N/A 08/25/2015  Procedure: XI ROBOTIC ASSISTED BILATERAL SALPINGO OOPHORECTOMY;  Surgeon: Laurette Schimke, MD;  Location: [facility] ORS;  Service: Gynecology;  Laterality: N/A;   TONSILLECTOMY     TOTAL KNEE ARTHROPLASTY Right 03/19/2022   Procedure: TOTAL KNEE ARTHROPLASTY;  Surgeon: Ollen Gross, MD;  Location: [facility] ORS;  Service: Orthopedics;  Laterality: Right;   TUBAL LIGATION     WRIST SURGERY Right    Patient Active Problem List   Diagnosis Date Noted   OA (osteoarthritis) of knee 03/19/2022   Cervical spondylosis with myelopathy and radiculopathy 04/19/2021   Genetic testing 08/01/2020   Family history of ovarian cancer 07/14/2020   Colon polyps 07/14/2020   Family history of pancreatic cancer 07/14/2020   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   CKD (chronic kidney disease) stage 3, GFR 30-59 ml/min 10/01/2017   History of normocytic normochromic anemia 11/25/2015   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    Spondylolisthesis of lumbar region 12/23/2013   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   Hyperthyroidism 05/25/2013   Obesity (BMI 30-39.9) 12/30/2012   Partial seizure disorder 08/21/2011   Meningioma 08/13/2011   DIZZINESS 11/02/2009   Type 2 diabetes mellitus, controlled 05/20/2009   HYPOKALEMIA 05/02/2009   DEPRESSION 06/24/2008   GERD 06/24/2008   PEPTIC ULCER DISEASE 06/24/2008   TMJ SYNDROME 06/09/2008   Hyperlipemia 11/08/2006   OBSTRUCTIVE SLEEP APNEA 11/08/2006   Essential hypertension 11/08/2006   ALLERGIC RHINITIS 11/08/2006   VOCAL CORD DISORDER 11/08/2006   ASTHMA 11/08/2006   TACHYARRHYTHMIA 11/08/2006   PCP: Kristian Covey, MD  REFERRING PROVIDER: Eartha Inch, PA   REFERRING DIAG: Unilateral primary osteoarthritis, right knee   THERAPY DIAG:  Acute pain of right knee  Stiffness of right knee, not elsewhere classified  Localized edema  Rationale for Evaluation and Treatment: Rehabilitation  ONSET DATE: 03/19/22  SUBJECTIVE:   SUBJECTIVE STATEMENT:  Using SPC only due to sciatica pain that will grab her occasionally.  PERTINENT HISTORY: Hypertension, diabetes, history of seizures, osteoarthritis, chronic low back pain, depression, asthma, allergies, and anxiety  PAIN: Are you having pain? No  PRECAUTIONS: None  PATIENT GOALS: be able to walk with a cane, reduced pain , improved mobility, and return to swimming  NEXT MD VISIT: 04/25/2022  OBJECTIVE:   PATIENT SURVEYS:  FOTO 63  EDEMA:  Circumferential: Right knee (joint line): 52.5 cm Left knee (joint line): 48 cm  PALPATION: TTP: right quadriceps, hip adductors, and lateral joint line  LOWER EXTREMITY ROM:  Active ROM Right eval Left eval Right 04/30/22 Right 05/03/22  Hip flexion      Hip extension      Hip abduction      Hip adduction      Hip internal rotation      Hip external rotation      Knee flexion 57/ 77 (PROM)  122  110  Knee extension 17 0 9 10  Ankle  dorsiflexion      Ankle plantarflexion      Ankle inversion      Ankle eversion       (Blank rows = not tested)  GAIT: Assistive device utilized: SPC due to sciatic pain Level of assistance: Modified independence Comments: Step through pattern with decreased gait speed and stride length  TODAY'S TREATMENT:  DATE:            05/03/22   EXERCISE LOG  Exercise Repetitions and Resistance Comments  Nustep L4 x 15 minutes , seat 8   Knee extensors 20# x30 reps   Knee flexors 40# x30 reps   Stair training Reciprical x4 stairs/ x2 RT unilateral handrail use   Figure 4 stretch 2x15 sec    Modalities  Date: 05/03/2022 Vaso: Knee, Low, 10 mins, Pain and Edema  PATIENT EDUCATION:  Education details: HEP, healing, prognosis, plan of care, and goals for therapy Person educated: Patient Education method: Explanation Education comprehension: verbalized understanding  HOME EXERCISE PROGRAM: Reviewed HEP provided by PT at the hospital Prone hangs  ASSESSMENT:  CLINICAL IMPRESSION: Patient presented in clinic with reports of no pain but now starting to experience sciatic related pain and states that that is the reason she still uses the [facility]. Patient able to tolerate therex and demonstrating great stair technique. Patient also reporting that sciatica symptoms are starting as well as hip stretching was instructed to manage those symptoms. Patient's RLE more swollen today which limits ROM. Patient also still diligent with knee extension with overpressure, extension bolster and prone hangs at home. Patient instructed to use her stationary bike, another device she has for knee flexion as well as the other activities to continue ROM progression as patient wished to try to continue from home. Normal vasopneumatic response noted following removal of the modality.  OBJECTIVE  IMPAIRMENTS: Abnormal gait, decreased activity tolerance, decreased balance, decreased mobility, difficulty walking, decreased ROM, decreased strength, hypomobility, increased edema, impaired flexibility, impaired sensation, and pain.   ACTIVITY LIMITATIONS: carrying, lifting, bending, standing, squatting, sleeping, stairs, transfers, bed mobility, bathing, dressing, and locomotion level  PARTICIPATION LIMITATIONS: meal prep, cleaning, laundry, driving, shopping, community activity, occupation, yard work, and church  PERSONAL FACTORS: Transportation and 3+ comorbidities: Hypertension, diabetes, history of seizures, osteoarthritis, chronic low back pain, depression, asthma, allergies, and anxiety  are also affecting patient's functional outcome.   REHAB POTENTIAL: Good  CLINICAL DECISION MAKING: Evolving/moderate complexity  EVALUATION COMPLEXITY: Moderate  GOALS: Goals reviewed with patient? Yes  SHORT TERM GOALS: Target date: 04/12/22 Patient will be independent with her initial HEP. Baseline: Goal status: MET  2.  Patient will be able to demonstrate at least 90 degrees of active right knee flexion for improved knee mobility. Baseline: 3/11: 79 degrees; 4/4: 110 degrees Goal status: MET  3.  Patient will be able to demonstrate active right knee extension within 10 degrees of neutral for improved gait mechanics. Baseline: 3/11: -16 degrees Goal status: MET  4.  Patient will be able to transfer from sitting to standing with equal weight distribution throughout her lower extremities for improved functional transfers. Baseline:  Goal status: MET  LONG TERM GOALS: Target date: 05/03/22  Patient will be independent with her advanced HEP. Baseline:  Goal status: MET  2.  Patient will be able to demonstrate at least 120 degrees of active right knee flexion for improved function navigating stairs. Baseline: 4/4: 110 degrees Goal status: NOT MET  3.  Patient will be able to  demonstrate active right knee extension within 5 degrees of neutral for improved gait mechanics. Baseline:  Goal status: NOT MET  4.  Patient will be able to navigate at least 3 steps with a reciprocal pattern for improved function with household mobility. Baseline:  Goal status: MET  5.  Patient will be able to safely ambulate with a cane or the least restrictive assistive  device for improved mobility. Baseline:  Goal status: MET  PLAN:  PT FREQUENCY: 2-3x/week  PT DURATION: 6 weeks  PLANNED INTERVENTIONS: Therapeutic exercises, Therapeutic activity, Neuromuscular re-education, Balance training, Gait training, Patient/Family education, Self Care, Joint mobilization, Stair training, Electrical stimulation, Cryotherapy, Moist heat, Vasopneumatic device, Manual therapy, and Re-evaluation  PLAN FOR NEXT SESSION: On hold  Marvell FullerKelsey P Latravion Graves, PTA 05/03/2022, 3:01 PM

## 2022-05-05 ENCOUNTER — Other Ambulatory Visit: Payer: Self-pay | Admitting: Family Medicine

## 2022-05-07 ENCOUNTER — Ambulatory Visit: Payer: Medicare Other

## 2022-05-10 ENCOUNTER — Encounter: Payer: Medicare Other | Admitting: *Deleted

## 2022-05-14 ENCOUNTER — Encounter: Payer: Medicare Other | Admitting: Physical Therapy

## 2022-05-17 ENCOUNTER — Encounter: Payer: Medicare Other | Admitting: Physical Therapy

## 2022-05-26 ENCOUNTER — Other Ambulatory Visit: Payer: Self-pay | Admitting: Family Medicine

## 2022-06-30 ENCOUNTER — Other Ambulatory Visit: Payer: Self-pay | Admitting: Endocrinology

## 2022-06-30 ENCOUNTER — Other Ambulatory Visit: Payer: Self-pay | Admitting: Family Medicine

## 2022-07-10 ENCOUNTER — Other Ambulatory Visit: Payer: Self-pay | Admitting: Family Medicine

## 2022-07-23 ENCOUNTER — Other Ambulatory Visit: Payer: Self-pay | Admitting: Family Medicine

## 2022-07-28 ENCOUNTER — Other Ambulatory Visit: Payer: Self-pay | Admitting: Endocrinology

## 2022-08-04 ENCOUNTER — Other Ambulatory Visit: Payer: Self-pay | Admitting: Family Medicine

## 2022-08-09 ENCOUNTER — Ambulatory Visit (INDEPENDENT_AMBULATORY_CARE_PROVIDER_SITE_OTHER): Payer: Medicare Other | Admitting: Endocrinology

## 2022-08-09 ENCOUNTER — Encounter: Payer: Self-pay | Admitting: Endocrinology

## 2022-08-09 VITALS — BP 120/80 | HR 82 | Ht 64.0 in | Wt 232.0 lb

## 2022-08-09 DIAGNOSIS — E890 Postprocedural hypothyroidism: Secondary | ICD-10-CM

## 2022-08-09 DIAGNOSIS — E1169 Type 2 diabetes mellitus with other specified complication: Secondary | ICD-10-CM

## 2022-08-09 DIAGNOSIS — E669 Obesity, unspecified: Secondary | ICD-10-CM | POA: Diagnosis not present

## 2022-08-09 LAB — BASIC METABOLIC PANEL
BUN: 17 mg/dL (ref 6–23)
CO2: 25 mEq/L (ref 19–32)
Calcium: 9.4 mg/dL (ref 8.4–10.5)
Chloride: 101 mEq/L (ref 96–112)
Creatinine, Ser: 1.22 mg/dL — ABNORMAL HIGH (ref 0.40–1.20)
GFR: 42.15 mL/min — ABNORMAL LOW (ref >60.00)
Glucose, Bld: 93 mg/dL (ref 70–99)
Potassium: 4.5 mEq/L (ref 3.5–5.1)
Sodium: 137 mEq/L (ref 135–145)

## 2022-08-09 LAB — CBC WITH DIFFERENTIAL/PLATELET
Basophils Absolute: 0 10*3/uL (ref 0.0–0.1)
Basophils Relative: 0.5 % (ref 0.0–3.0)
Eosinophils Absolute: 0.1 10*3/uL (ref 0.0–0.7)
Eosinophils Relative: 1.4 % (ref 0.0–5.0)
HCT: 39.3 % (ref 36.0–46.0)
Hemoglobin: 12.4 g/dL (ref 12.0–15.0)
Lymphocytes Relative: 31.5 % (ref 12.0–46.0)
Lymphs Abs: 1.5 10*3/uL (ref 0.7–4.0)
MCHC: 31.7 g/dL (ref 30.0–36.0)
MCV: 78.9 fl (ref 78.0–100.0)
Monocytes Absolute: 0.3 10*3/uL (ref 0.1–1.0)
Monocytes Relative: 6.1 % (ref 3.0–12.0)
Neutro Abs: 2.8 10*3/uL (ref 1.4–7.7)
Neutrophils Relative %: 60.5 % (ref 43.0–77.0)
Platelets: 174 10*3/uL (ref 150.0–400.0)
RBC: 4.98 Mil/uL (ref 3.87–5.11)
RDW: 16.8 % — ABNORMAL HIGH (ref 11.5–15.5)
WBC: 4.7 10*3/uL (ref 4.0–10.5)

## 2022-08-09 LAB — POCT GLYCOSYLATED HEMOGLOBIN (HGB A1C): Hemoglobin A1C: 6.3 % — AB (ref 4.0–5.6)

## 2022-08-09 LAB — T4, FREE: Free T4: 0.96 ng/dL (ref 0.60–1.60)

## 2022-08-09 LAB — TSH: TSH: 10.04 u[IU]/mL — ABNORMAL HIGH (ref 0.35–5.50)

## 2022-08-09 NOTE — Progress Notes (Signed)
Patient ID: Dawn Salas, female   DOB: 11-Feb-1940, 82 y.o.   MRN: [id number]                                                                                                                  Chief complaint: Endocrinology follow-up   History of Present Illness:   HYPOTHYROIDISM/goiter:  She had a multinodular goiter evaluated with a fine-needle aspiration in 09/2010 She was subsequently diagnosed as having Hyperthyroidism in 2015 when she was having palpitations, also had a goiter. At that time she was having the symptoms of fatigue, shakiness, palpitations, heat intolerance and some weight loss She was treated with I-131, unknown dose which was prescribed in March 2015 However subsequently her hyperthyroidism recurred and methimazole restarted ?  In 4/17 Also the dose was increased up to 7.5 mg in 10/2015  Since she was significantly hyperthyroid on her initial exam in February and free T3 was 5.0 and was treated with methimazole Because of persistent hyperthyroidism and very high thyrotropin receptor antibody she was given I-131 treatment with 20.3 mCi on 06/04/16  RECENT history:  She has been HYPOTHYROID as of 07/2016 with a low free T4 level  She was also having some fatigue and cold sensitivity as well as the muscle cramps With starting levothyroxine 125 g she felt better, however subsequently TSH was low again the dose was reduced progressively  She is taking 75g of levothyroxine, 7 1/2 tablets weekly since 01/2021 As before she complained of feeling tired  She has taken her levothyroxine before breakfast consistently and has not missed any doses   She takes her Geritol with iron after breakfast half an hour later  TSH has been usually consistently normal  Wt Readings from Last 3 Encounters:  08/09/22 232 lb (105.2 kg)  03/19/22 225 lb (102.1 kg)  03/06/22 225 lb (102.1 kg)     Thyroid function tests as follows:     Lab Results  Component Value Date   TSH 2.40  02/07/2022   TSH 2.46 08/04/2021   TSH 2.94 03/31/2021   FREET4 1.10 02/07/2022   FREET4 1.04 08/04/2021   FREET4 1.32 03/31/2021   Graves' ophthalmopathy:  History of diplopia treated by ophthalmologist with wearing prism lenses Has some tearing of the eyes and has been given drops by the ophthalmologist She is followed by ophthalmologist regularly Previous MRI had shown mild thickening of the rectus muscle on the left in 2018  DIABETES: See review of systems    Allergies as of 08/09/2022       Reactions   Flagyl [metronidazole] Other (See Comments)   Caused increased heart rate   Aspirin Effervescent Swelling   Alka-Seltzer gel caps- sore mouth, face swollen, turned hands white   Morphine Sulfate Hives   Hallucinations   Penicillins Swelling, Rash   Previously tolerated cephalexin        Medication List        Accurate as of August 09, 2022 11:16 AM. If you have any questions, ask  your nurse or doctor.          Accu-Chek Aviva Plus test strip Generic drug: glucose blood TEST BLOOD SUGAR TWICE DAILY.   accu-chek softclix lancets 1 each by Other route 2 (two) times daily. E11.9   Accu-Chek Softclix Lancets lancets TEST BLOOD SUGAR TWICE DAILY.   acetaminophen 650 MG CR tablet Commonly known as: TYLENOL Take 1,300 mg by mouth every 8 (eight) hours as needed for pain.   amLODipine 5 MG tablet Commonly known as: NORVASC TAKE ONE (1) TABLET EACH DAY   ascorbic acid 500 MG tablet Commonly known as: VITAMIN C Take 500 mg by mouth daily.   cetirizine 10 MG tablet Commonly known as: ZYRTEC Take 10 mg by mouth daily as needed for allergies.   Flax Seed Oil 1000 MG Caps Take 1,000 mg by mouth daily.   furosemide 40 MG tablet Commonly known as: LASIX Take 0.5 tablets (20 mg total) by mouth daily as needed. For fluid   GERITOL COMPLETE PO Take 1 tablet by mouth daily.   Janumet XR 50-500 MG Tb24 Generic drug: SitaGLIPtin-MetFORMIN HCl TAKE ONE (1)  TABLET EACH DAY   levothyroxine 75 MCG tablet Commonly known as: SYNTHROID TAKE ONE (1) TABLET EACH DAY   losartan-hydrochlorothiazide 100-12.5 MG tablet Commonly known as: HYZAAR TAKE ONE (1) TABLET EACH DAY What changed: See the new instructions.   Magnesium 250 MG Tabs Take 250 mg by mouth daily.   meclizine 25 MG tablet Commonly known as: ANTIVERT Take 25 mg by mouth 3 (three) times daily as needed for dizziness.   methocarbamol 500 MG tablet Commonly known as: ROBAXIN Take 1 tablet (500 mg total) by mouth every 6 (six) hours as needed for muscle spasms.   metoprolol succinate 50 MG 24 hr tablet Commonly known as: TOPROL-XL TAKE ONE (1) TABLET BY MOUTH EVERY DAY   oxyCODONE 5 MG immediate release tablet Commonly known as: Oxy IR/ROXICODONE Take 1-2 tablets (5-10 mg total) by mouth every 6 (six) hours as needed for severe pain.   potassium chloride 10 MEQ tablet Commonly known as: KLOR-CON TAKE ONE (1) TABLET EACH DAY   RABEprazole 20 MG tablet Commonly known as: ACIPHEX TAKE ONE (1) TABLET EACH DAY   rosuvastatin 10 MG tablet Commonly known as: CRESTOR Take 10 mg by mouth daily.   solifenacin 5 MG tablet Commonly known as: VESICARE TAKE ONE (1) TABLET BY MOUTH EVERY DAY   Systane Balance 0.6 % Soln Generic drug: Propylene Glycol Place 1 drop into both eyes daily as needed (dry eyes).   traMADol 50 MG tablet Commonly known as: ULTRAM Take 1-2 tablets (50-100 mg total) by mouth every 6 (six) hours as needed for moderate pain.   valACYclovir 1000 MG tablet Commonly known as: VALTREX TAKE 2 TABS TWICE DAILY FOR 1 DAY THEN AS NEEDED What changed: See the new instructions.            Past Medical History:  Diagnosis Date   Allergy    Anxiety    Arthritis    Asthma    patient denies    Cataract    bilateral lens implants    Colon polyps    Depression    Diabetes mellitus    Type two   Diverticulitis    Double vision 2019   left eye   Family  history of adverse reaction to anesthesia    daughter- N/V    Family history of ovarian cancer    Family history of pancreatic  cancer    Frequency of urination    GERD (gastroesophageal reflux disease)    History of adverse reaction to anesthesia 03/19/2022   After regional block for TKA pt states she had uncontrollable frequent blinking and "zoned out" was unable to speak more than one or two words for about 10 minutes   History of hiatal hernia    Hyperglycemia    Hyperlipidemia    Hypertension    Hyperthyroidism    PT HAD BIPOSY -NEGATIVE.Marland Kitchen AND NOW JUST GETS CHECKED EACH YEAR .Marland Kitchen 2013 LAST TEST   SEES DR. [name].    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 DOES NOT USE C PAP   Palpitations    Peptic ulcer disease    Pneumonia    "walking pneumonia" hx of   Seizures (HCC) 2015   one siezure with brain surgery none since 2015    Sleep apnea    no CPAP   Urinary tract infection 03/2020   Vertigo     Past Surgical History:  Procedure Laterality Date   ABDOMINAL HYSTERECTOMY     ANTERIOR CERVICAL DECOMP/DISCECTOMY FUSION N/A 04/19/2021   Procedure: Anterior Cervical Discectomy and Fusion with Interbody Prosthesis, Plates/Screws Cervical Three-Four/Cervical Four-Five REMoval CERVICAL PLATE;  Surgeon: Tressie Stalker, MD;  [facility] OR;  Service: Neurosurgery;  Laterality: N/A;  3C   BACK SURGERY     L SPIN 2003   NECK 2004   BRAIN MENINGIOMA EXCISION  08/2008   X 2   C5-C6 neck fusion     COLONOSCOPY  05/05/2009   pt states multiple polyps removed in both colonoscopies she has had   CRANIOTOMY  08/13/2011   Procedure: CRANIOTOMY TUMOR EXCISION;  Surgeon: Cristi Loron, MD;  Location: [facility];  Service: Neurosurgery;  Laterality: Bilateral;  Bifrontal craniotomy for tumor   DILATION AND CURETTAGE OF UTERUS     YEARS AGO   L4-L5 posterior la  2021   L3-L5 fusion   left foot plantar and hammertoe     right foot  bunectomy     right knee arthroscopy     x 3   right shoulder rotator cuff     ROBOTIC ASSISTED BILATERAL SALPINGO OOPHERECTOMY N/A 08/25/2015   Procedure: XI ROBOTIC ASSISTED BILATERAL SALPINGO OOPHORECTOMY;  Surgeon: Laurette Schimke, MD;  Location: [facility] ORS;  Service: Gynecology;  Laterality: N/A;   TONSILLECTOMY     TOTAL KNEE ARTHROPLASTY Right 03/19/2022   Procedure: TOTAL KNEE ARTHROPLASTY;  Surgeon: Ollen Gross, MD;  Location: [facility] ORS;  Service: Orthopedics;  Laterality: Right;   TUBAL LIGATION     WRIST SURGERY Right     Family History  Problem Relation Age of Onset   Ovarian cancer Mother    Heart disease Father 8   Pancreatic cancer Maternal Aunt    Diabetes Maternal Grandmother    Anesthesia problems Daughter        NAUSEA AND VOMITING POST OP   Arthritis Other    Hyperlipidemia Other    Hypertension Other    Diabetes Other    Breast cancer Neg Hx    Colon cancer Neg Hx    Colon polyps Neg Hx    Esophageal cancer Neg Hx    Stomach cancer Neg Hx    Rectal cancer Neg Hx     Social History:  reports that she has never smoked. She has never used smokeless tobacco. She reports that  she does not drink alcohol and does not use drugs.  Allergies:  Allergies  Allergen Reactions   Flagyl [Metronidazole] Other (See Comments)    Caused increased heart rate   Aspirin Effervescent Swelling    Alka-Seltzer gel caps- sore mouth, face swollen, turned hands white   Morphine Sulfate Hives    Hallucinations   Penicillins Swelling and Rash    Previously tolerated cephalexin      Review of Systems    DIABETES type II  She has had longstanding diabetes, with mild obesity  Previously with relatively higher A1c of 7% and increasing blood sugars she was switched from metformin to Janumet in 6/21  She was also feeling shaky sometimes when taking metformin in the morning  She takes JANUMET, now taking only 5/500 once daily at lunchtime  She has good control with A1c still  excellent at 6.3  Usually tries to eat healthy meals  Has egg/meat for protein at breakfast and avoiding cereal now Weight is however going up  Again not exercising since her knee surgery She has mostly checked fasting blood sugars and some in the late afternoon She thinks her blood sugars may sometimes be low at about noon with occasional symptoms of low sugars but has only 1 documented low sugar about 5 weeks ago of 68   Blood sugars from review of the monitor:  FASTING recently: 92-114 Midday 82-182  Wt Readings from Last 3 Encounters:  08/09/22 232 lb (105.2 kg)  03/19/22 225 lb (102.1 kg)  03/06/22 225 lb (102.1 kg)    Lab Results  Component Value Date   HGBA1C 5.9 (H) 03/06/2022   HGBA1C 6.0 (A) 01/01/2022   HGBA1C 6.5 (A) 08/04/2021   Lab Results  Component Value Date   MICROALBUR 1.2 02/02/2021   LDLCALC 73 10/06/2021   CREATININE 1.08 (H) 03/20/2022   HYPERTENSION: Followed by PCP, treated with losartan   BP Readings from Last 3 Encounters:  08/09/22 120/80  03/20/22 (!) 160/77  03/06/22 (!) 147/72   LIPIDS: She is taking 40 mg simvastatin from her PCP for hypercholesterolemia    Examination:   BP 120/80 (BP Location: Left Arm, Patient Position: Sitting, Cuff Size: Large)   Pulse 82   Ht 5\' 4"  (1.626 m)   Wt 232 lb (105.2 kg)   SpO2 94%   BMI 39.82 kg/m      Assessment/Plan:  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-131  Last visit was in 1/24 She has had fatigue chronically even though has had normal thyroid levels  Currently taking 7-1/2 tablets a week of the 75 mcg  Thyroid labs pending  DIABETES with severe obesity: She has hypertension, diabetes, dyslipidemia  Her blood sugars are relatively well controlled with Janumet low doses of 50/500 only A1c is 6.3 She has increased her protein at breakfast but still sometimes has mild hypoglycemic symptoms midday which she thinks occurred when her blood sugars are below  100  Discussed need to have a midmorning snack especially if she is eating breakfast early and will not eat till after 12 noon Hopefully she can start home exercise when she is recovering from her knee surgery and discussed need for weight loss  Follow-up in 6 months    There are no Patient Instructions on file for this visit.  Reather Littler 08/09/2022, 11:16 AM

## 2022-08-09 NOTE — Patient Instructions (Signed)
Check blood sugars on waking up 2 days a week  Also check blood sugars about 2 hours after meals and do this after different meals by rotation  Recommended blood sugar levels on waking up are 90-130 and about 2 hours after meal is 130-160  Please bring your blood sugar monitor to each visit, thank you   Snack at 11 am

## 2022-08-10 ENCOUNTER — Other Ambulatory Visit: Payer: Self-pay | Admitting: Endocrinology

## 2022-08-10 MED ORDER — LEVOTHYROXINE SODIUM 100 MCG PO TABS: 100.0000 ug | ORAL_TABLET | Freq: Every day | ORAL | 3 refills | Status: DC

## 2022-08-10 NOTE — Progress Notes (Signed)
Thyroid level is low I am now sending a prescription for levothyroxine 100 mcg daily, we will need to set up your follow-up for 59-month instead of 6 months.  Other tests are okay except slight worsening of kidney test

## 2022-08-20 ENCOUNTER — Other Ambulatory Visit: Payer: Self-pay | Admitting: Family Medicine

## 2022-09-15 ENCOUNTER — Other Ambulatory Visit: Payer: Self-pay | Admitting: Family Medicine

## 2022-09-19 ENCOUNTER — Other Ambulatory Visit: Payer: Self-pay | Admitting: Family Medicine

## 2022-09-19 DIAGNOSIS — Z1231 Encounter for screening mammogram for malignant neoplasm of breast: Secondary | ICD-10-CM

## 2022-09-25 ENCOUNTER — Other Ambulatory Visit: Payer: Self-pay | Admitting: Family Medicine

## 2022-10-10 ENCOUNTER — Ambulatory Visit: Payer: Medicare Other

## 2022-10-15 ENCOUNTER — Other Ambulatory Visit: Payer: Self-pay | Admitting: Family Medicine

## 2022-10-15 ENCOUNTER — Ambulatory Visit: Payer: Medicare Other | Admitting: Endocrinology

## 2022-10-22 ENCOUNTER — Other Ambulatory Visit: Payer: Self-pay

## 2022-10-22 ENCOUNTER — Ambulatory Visit: Payer: Medicare Other | Attending: Student | Admitting: Physical Therapy

## 2022-10-22 DIAGNOSIS — M62830 Muscle spasm of back: Secondary | ICD-10-CM | POA: Diagnosis present

## 2022-10-22 DIAGNOSIS — M5459 Other low back pain: Secondary | ICD-10-CM | POA: Diagnosis present

## 2022-10-22 DIAGNOSIS — R293 Abnormal posture: Secondary | ICD-10-CM | POA: Diagnosis present

## 2022-10-22 NOTE — Therapy (Signed)
OUTPATIENT PHYSICAL THERAPY THORACOLUMBAR EVALUATION   Patient Name: Dawn Salas MRN: [id number] DOB:[birth year], 82 y.o., female Today's Date: 10/22/2022  END OF SESSION:  PT End of Session - 10/22/22 1128     Visit Number 1    Number of Visits 12    Date for PT Re-Evaluation 12/03/22    Authorization Type FOTO.    PT Start Time 1056    Activity Tolerance Patient tolerated treatment well    Behavior During Therapy [facility] for tasks assessed/performed             Past Medical History:  Diagnosis Date   Allergy    Anxiety    Arthritis    Asthma    patient denies    Cataract    bilateral lens implants    Colon polyps    Depression    Diabetes mellitus    Type two   Diverticulitis    Double vision 2019   left eye   Family history of adverse reaction to anesthesia    daughter- N/V    Family history of ovarian cancer    Family history of pancreatic cancer    Frequency of urination    GERD (gastroesophageal reflux disease)    History of adverse reaction to anesthesia 03/19/2022   After regional block for TKA pt states she had uncontrollable frequent blinking and "zoned out" was unable to speak more than one or two words for about 10 minutes   History of hiatal hernia    Hyperglycemia    Hyperlipidemia    Hypertension    Hyperthyroidism    PT HAD BIPOSY -NEGATIVE.Marland Kitchen AND NOW JUST GETS CHECKED EACH YEAR .Marland Kitchen 2013 LAST TEST   SEES DR. [name].    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 DOES NOT USE C PAP   Palpitations    Peptic ulcer disease    Pneumonia    "walking pneumonia" hx of   Seizures (HCC) 2015   one siezure with brain surgery none since 2015    Sleep apnea    no CPAP   Urinary tract infection 03/2020   Vertigo    Past Surgical History:  Procedure Laterality Date   ABDOMINAL HYSTERECTOMY     ANTERIOR CERVICAL DECOMP/DISCECTOMY FUSION N/A 04/19/2021   Procedure: Anterior Cervical Discectomy and Fusion  with Interbody Prosthesis, Plates/Screws Cervical Three-Four/Cervical Four-Five REMoval CERVICAL PLATE;  Surgeon: Tressie Stalker, MD;  Location: [facility] OR;  Service: Neurosurgery;  Laterality: N/A;  3C   BACK SURGERY     L SPIN 2003   NECK 2004   BRAIN MENINGIOMA EXCISION  08/2008   X 2   C5-C6 neck fusion     COLONOSCOPY  05/05/2009   pt states multiple polyps removed in both colonoscopies she has had   CRANIOTOMY  08/13/2011   Procedure: CRANIOTOMY TUMOR EXCISION;  Surgeon: Cristi Loron, MD;  Location: [facility];  Service: Neurosurgery;  Laterality: Bilateral;  Bifrontal craniotomy for tumor   DILATION AND CURETTAGE OF UTERUS     YEARS AGO   L4-L5 posterior la  2021   L3-L5 fusion   left foot plantar and hammertoe     right foot bunectomy     right knee arthroscopy     x 3   right shoulder rotator cuff     ROBOTIC ASSISTED BILATERAL SALPINGO OOPHERECTOMY N/A 08/25/2015   Procedure: XI ROBOTIC ASSISTED BILATERAL SALPINGO OOPHORECTOMY;  Surgeon: Laurette Schimke, MD;  Location: [facility] ORS;  Service: Gynecology;  Laterality: N/A;   TONSILLECTOMY     TOTAL KNEE ARTHROPLASTY Right 03/19/2022   Procedure: TOTAL KNEE ARTHROPLASTY;  Surgeon: Ollen Gross, MD;  Location: [facility] ORS;  Service: Orthopedics;  Laterality: Right;   TUBAL LIGATION     WRIST SURGERY Right    Patient Active Problem List   Diagnosis Date Noted   OA (osteoarthritis) of knee 03/19/2022   Cervical spondylosis with myelopathy and radiculopathy 04/19/2021   Genetic testing 08/01/2020   Family history of ovarian cancer 07/14/2020   Colon polyps 07/14/2020   Family history of pancreatic cancer 07/14/2020   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   CKD (chronic kidney disease) stage 3, GFR 30-59 ml/min (HCC) 10/01/2017   History of normocytic normochromic anemia 11/25/2015   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   Spondylolisthesis of lumbar  region 12/23/2013   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   Hyperthyroidism 05/25/2013   Obesity (BMI 30-39.9) 12/30/2012   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HCC) 08/21/2011   Meningioma (HCC) 08/13/2011   DIZZINESS 11/02/2009   Type 2 diabetes mellitus, controlled (HCC) 05/20/2009   HYPOKALEMIA 05/02/2009   DEPRESSION 06/24/2008   GERD 06/24/2008   PEPTIC ULCER DISEASE 06/24/2008   TMJ SYNDROME 06/09/2008   Hyperlipemia 11/08/2006   OBSTRUCTIVE SLEEP APNEA 11/08/2006   Essential hypertension 11/08/2006   ALLERGIC RHINITIS 11/08/2006   VOCAL CORD DISORDER 11/08/2006   ASTHMA 11/08/2006   TACHYARRHYTHMIA 11/08/2006    REFERRING PROVIDER: Val Eagle  REFERRING DIAG: Spinal stenosis of lumbar region with neurogenic claudication.  Rationale for Evaluation and Treatment: Rehabilitation  THERAPY DIAG:  Other low back pain  Abnormal posture  Muscle spasm of back  ONSET DATE: ~February of 2024 (after a right TKA).  SUBJECTIVE:                                                                                                                                                                                           SUBJECTIVE STATEMENT: The patient presents to the clinic with c/o right-sided low pain that has gotten quite bad since having undergone a right total knee replacement on 03/19/22.  She has had some low back pain previously but usually could take a Tylenol and it would go away.  She states she is okay while seated but going from sit to stand is very pain-level and prolonged standing and walking increase her pain to high levels.  She occasionally feels a "catch" in her right low back region.  It is difficult for her to perform her ADL's such has  cooking due to high pain-levels.    PERTINENT HISTORY:  Right total knee replacement, 2 previous lumbar surgeries.  PAIN:  Are you having pain? Yes: NPRS scale: 8/10 Pain location: Right low back. Pain description: Sore and  numb. Aggravating factors: See above. Relieving factors: Sitting.  PRECAUTIONS: None  RED FLAGS: None   WEIGHT BEARING RESTRICTIONS: No  FALLS:  Has patient fallen in last 6 months? Yes. Number of falls 1.  Flipped out of a Rocker (saw her MD).  LIVING ENVIRONMENT: Lives in: House/apartment Stairs: Uses railing. Has following equipment at home: Single point cane  OCCUPATION: Retired.  PLOF: Independent with basic ADLs  PATIENT GOALS: Get pain down and do more around the house like she was doing before.  OBJECTIVE:   PATIENT SURVEYS:  FOTO 37.  POSTURE: rounded shoulders, forward head, decreased lumbar lordosis, and flexed trunk .   PALPATION:    Tender and taut to palpation over right lower lumbar musculature and right SIJ and proximal gluteal musculature.  LUMBAR/LE ROM:   The patient stand in 10 degrees of lumbar flexion and she can extend to 5 degrees.  Her active trunk flexion is near normal.  Her right knee is ~5 degrees from being equal to her left knee.  We discussed her continuing to use her Zero Knee to equalize her knee extension.     LOWER EXTREMITY MMT:    She has no bilateral LE strength deficits.  LUMBAR SPECIAL TESTS:  Negative right SLR test.    GAIT: Her gait is antalgic in nature and she walks in a flexed trunk posture with a straight cane.    TODAY'S TREATMENT:                                                                                                                              DATE: HMP and IFC at 80-150 Hz on 40% scan x 20 minutes to patient's right low back/SIJ region.   Normal modality response following removal of modality.   PATIENT EDUCATION:  Education details: We discussed correct posture and trying to achieve an upright posture as well as using her Zero Knee to work on increasing her right knee extension.   Person educated: Patient Education method: Explanation Education comprehension: verbalized understanding  HOME  EXERCISE PROGRAM: Draw-in technique  ASSESSMENT:  CLINICAL IMPRESSION: The patient patient presents to OPPT with c/o right-sided low back pain that has gotten to high levels since having a right total knee replacement on 10/18/22.  Her pain rises to high levels when going from sit to stand, standing and walking and performing ADL's such as cooking.  She is tender to palpation and taut over her right lower lumbar musculature, SIJ and proximal gluteal musculature.  Her giat is natlagic in nature and she walks in some trunk flexion.  She uses a straight cane.  Her FOTO limitation score is 37.  Patient will benefit from skilled physical therapy intervention to address  pain and deficits.  OBJECTIVE IMPAIRMENTS: Abnormal gait, decreased activity tolerance, difficulty walking, decreased ROM, increased muscle spasms, postural dysfunction, and pain.   ACTIVITY LIMITATIONS: carrying, lifting, bending, standing, transfers, and locomotion level  PARTICIPATION LIMITATIONS: meal prep, cleaning, and laundry  PERSONAL FACTORS: Time since onset of injury/illness/exacerbation and 1-2 comorbidities: 2 prior lumbar surgeries and a right total knee replacement  are also affecting patient's functional outcome.   REHAB POTENTIAL: Good  CLINICAL DECISION MAKING: Stable/uncomplicated  EVALUATION COMPLEXITY: Low   GOALS:  SHORT TERM GOALS: Target date: 11/05/22  Ind with a initial HEP. Goal status: INITIAL  LONG TERM GOALS: Target date: 12/03/22  Ind with an advanced HEP.  Goal status: INITIAL  2.  Stand 20 minutes with pain not > 3-4/10.  Goal status: INITIAL  3.  Walk without antalgia.  Goal status: INITIAL  4.  Perform ADL's with pain not > 3-4/10.  Goal status: INITIAL   PLAN:  PT FREQUENCY: 2x/week  PT DURATION: 6 weeks  PLANNED INTERVENTIONS: Therapeutic exercises, Therapeutic activity, Gait training, Patient/Family education, Self Care, Dry Needling, Electrical stimulation,  Cryotherapy, Moist heat, Ultrasound, and Manual therapy.  PLAN FOR NEXT SESSION: Combo e'stim/US, STW/M , draw-in progression.  Core exercise progression.  Postural improvement exercises. Draw-in progression,    Lynette Noah, [place], PT 10/22/2022, 12:37 PM

## 2022-10-24 ENCOUNTER — Ambulatory Visit
Admission: RE | Admit: 2022-10-24 | Discharge: 2022-10-24 | Disposition: A | Discharge status: Home / Self Care | Payer: Medicare Other | Source: Ambulatory Visit | Attending: Family Medicine | Admitting: Family Medicine

## 2022-10-24 ENCOUNTER — Encounter: Payer: Self-pay | Admitting: Endocrinology

## 2022-10-24 ENCOUNTER — Ambulatory Visit: Payer: Medicare Other | Admitting: Endocrinology

## 2022-10-24 VITALS — BP 120/90 | HR 84 | Ht 64.0 in | Wt 236.4 lb

## 2022-10-24 DIAGNOSIS — E890 Postprocedural hypothyroidism: Secondary | ICD-10-CM

## 2022-10-24 DIAGNOSIS — E1169 Type 2 diabetes mellitus with other specified complication: Secondary | ICD-10-CM

## 2022-10-24 DIAGNOSIS — Z7984 Long term (current) use of oral hypoglycemic drugs: Secondary | ICD-10-CM | POA: Diagnosis not present

## 2022-10-24 DIAGNOSIS — Z8639 Personal history of other endocrine, nutritional and metabolic disease: Secondary | ICD-10-CM | POA: Diagnosis not present

## 2022-10-24 DIAGNOSIS — E669 Obesity, unspecified: Secondary | ICD-10-CM | POA: Diagnosis not present

## 2022-10-24 DIAGNOSIS — Z1231 Encounter for screening mammogram for malignant neoplasm of breast: Secondary | ICD-10-CM

## 2022-10-24 LAB — TSH: TSH: 1.2 u[IU]/mL (ref 0.35–5.50)

## 2022-10-24 LAB — POCT GLYCOSYLATED HEMOGLOBIN (HGB A1C): Hemoglobin A1C: 6.3 % — AB (ref 4.0–5.6)

## 2022-10-24 LAB — T4, FREE: Free T4: 1.35 ng/dL (ref 0.60–1.60)

## 2022-10-24 NOTE — Progress Notes (Unsigned)
Outpatient Endocrinology Note Dawn Berish Bohman, MD  10/25/22  Patient's Name: Dawn Salas    DOB: [birth year]    MRN: [id number]                                                    REASON OF VISIT: Follow up for type 2 diabetes mellitus / hypothyroidism  PCP: Kristian Covey, MD  HISTORY OF PRESENT ILLNESS:   Dawn Salas is a 82 y.o. old female with past medical history listed below, is here for follow up of type 2 diabetes mellitus /postablative hypothyroidism.   Pertinent Diabetes History: Patient has longstanding history of type 2 diabetes mellitus with mild obesity.  She has controlled type 2 diabetes mellitus.  Chronic Diabetes Complications : Retinopathy: no. Last ophthalmology exam was done on annually, reportedly.  Nephropathy: no, on losartan Peripheral neuropathy: no Coronary artery disease: no Stroke: no  Relevant comorbidities and cardiovascular risk factors: Obesity: yes Body mass index is 40.58 kg/m.  Hypertension: yes Hyperlipidemia. Yes, on statin.   Current / Home Diabetic regimen includes:  Janumet XR 5/500 1 tablet daily at lunchtime.  Prior diabetic medications: Metformin alone in the past.  Glycemic data:   Not able to download glucometer in the clinic today.  However blood sugar reviewed directly from the meter as follows.  All the blood sugars are acceptable. -  111, 110, 112, 106,114,102, 113, 93,109.  Hypoglycemia: Patient has no hypoglycemic episodes. Patient has hypoglycemia awareness.  She feels hypoglycemic symptoms when blood sugar is below 90.  # Postablative hypothyroidism -Patient had multinodular goiter evaluated with fine-needle aspiration in September 2012.  She was subsequently diagnosed with having hyperthyroidism in 2015 when she had palpitations and had a goiter.  At that time she was having symptoms of fatigue and shakiness and palpitation, heat intolerance and and some weight loss.  She was treated with I-131 unknown dose  radioactive iodine ablation in 2015 however subsequently her hyperthyroidism reoccurred and methimazole was restarted in ?  2017.  Due to persistent significant hyperthyroidism and frequently elevated thyrotropin receptor antibodies she was treated with RAI I-131 with 20.3 mCi on Jun 04, 2016.  Patient had Graves' disease status post RAI ablation x 2 , now postablative hypothyroidism.  Patient is currently on thyroid hormone replacement /levothyroxine and dose had been adjusted multiple times in the past.  In July 2020.  Patient had TSH 10, levothyroxine dose was increased from 75 to 100 mcg daily.   Graves' ophthalmopathy:  History of diplopia treated by ophthalmologist with wearing prism lenses. Has some tearing of the eyes and has been given drops by the ophthalmologist. She is followed by ophthalmologist regularly Previous MRI had shown mild thickening of the rectus muscle on the left in 2018.  Interval history 10/25/22 Patient has been taking levothyroxine 100 mg daily.  Dose was increased in July.  Patient denies palpitation or heat intolerance.  Overall she has not felt any difference in terms of symptoms.  Patient has been taking Janumet 1 tablet daily.  Glucometer data as reviewed above.  No other complaints today.  REVIEW OF SYSTEMS As per history of present illness.   PAST MEDICAL HISTORY: Past Medical History:  Diagnosis Date   Allergy    Anxiety    Arthritis    Asthma    patient denies  Cataract    bilateral lens implants    Colon polyps    Depression    Diabetes mellitus    Type two   Diverticulitis    Double vision 2019   left eye   Family history of adverse reaction to anesthesia    daughter- N/V    Family history of ovarian cancer    Family history of pancreatic cancer    Frequency of urination    GERD (gastroesophageal reflux disease)    History of adverse reaction to anesthesia 03/19/2022   After regional block for TKA pt states she had uncontrollable  frequent blinking and "zoned out" was unable to speak more than one or two words for about 10 minutes   History of hiatal hernia    Hyperglycemia    Hyperlipidemia    Hypertension    Hyperthyroidism    PT HAD BIPOSY -NEGATIVE.Marland Kitchen AND NOW JUST GETS CHECKED EACH YEAR .Marland Kitchen 2013 LAST TEST   SEES DR. [name].    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 DOES NOT USE C PAP   Palpitations    Peptic ulcer disease    Pneumonia    "walking pneumonia" hx of   Seizures (HCC) 2015   one siezure with brain surgery none since 2015    Sleep apnea    no CPAP   Urinary tract infection 03/2020   Vertigo     PAST SURGICAL HISTORY: Past Surgical History:  Procedure Laterality Date   ABDOMINAL HYSTERECTOMY     ANTERIOR CERVICAL DECOMP/DISCECTOMY FUSION N/A 04/19/2021   Procedure: Anterior Cervical Discectomy and Fusion with Interbody Prosthesis, Plates/Screws Cervical Three-Four/Cervical Four-Five REMoval CERVICAL PLATE;  Surgeon: Tressie Stalker, MD;  Location: [facility] OR;  Service: Neurosurgery;  Laterality: N/A;  3C   BACK SURGERY     L SPIN 2003   NECK 2004   BRAIN MENINGIOMA EXCISION  08/2008   X 2   C5-C6 neck fusion     COLONOSCOPY  05/05/2009   pt states multiple polyps removed in both colonoscopies she has had   CRANIOTOMY  08/13/2011   Procedure: CRANIOTOMY TUMOR EXCISION;  Surgeon: Cristi Loron, MD;  Location: [facility];  Service: Neurosurgery;  Laterality: Bilateral;  Bifrontal craniotomy for tumor   DILATION AND CURETTAGE OF UTERUS     YEARS AGO   L4-L5 posterior la  2021   L3-L5 fusion   left foot plantar and hammertoe     right foot bunectomy     right knee arthroscopy     x 3   right shoulder rotator cuff     ROBOTIC ASSISTED BILATERAL SALPINGO OOPHERECTOMY N/A 08/25/2015   Procedure: XI ROBOTIC ASSISTED BILATERAL SALPINGO OOPHORECTOMY;  Surgeon: Laurette Schimke, MD;  Location: [facility] ORS;  Service: Gynecology;  Laterality: N/A;    TONSILLECTOMY     TOTAL KNEE ARTHROPLASTY Right 03/19/2022   Procedure: TOTAL KNEE ARTHROPLASTY;  Surgeon: Ollen Gross, MD;  Location: [facility] ORS;  Service: Orthopedics;  Laterality: Right;   TUBAL LIGATION     WRIST SURGERY Right     ALLERGIES: Allergies  Allergen Reactions   Flagyl [Metronidazole] Other (See Comments)    Caused increased heart rate   Aspirin Effervescent Swelling    Alka-Seltzer gel caps- sore mouth, face swollen, turned hands white   Morphine Sulfate Hives    Hallucinations   Penicillins Swelling and Rash    Previously tolerated cephalexin  FAMILY HISTORY:  Family History  Problem Relation Age of Onset   Ovarian cancer Mother    Heart disease Father 69   Pancreatic cancer Maternal Aunt    Diabetes Maternal Grandmother    Anesthesia problems Daughter        NAUSEA AND VOMITING POST OP   Arthritis Other    Hyperlipidemia Other    Hypertension Other    Diabetes Other    Breast cancer Neg Hx    Colon cancer Neg Hx    Colon polyps Neg Hx    Esophageal cancer Neg Hx    Stomach cancer Neg Hx    Rectal cancer Neg Hx     SOCIAL HISTORY: Social History   Socioeconomic History   Marital status: Married    Spouse name: Not on file   Number of children: 6   Years of education: Not on file   Highest education level: Not on file  Occupational History   Not on file  Tobacco Use   Smoking status: Never   Smokeless tobacco: Never  Vaping Use   Vaping status: Never Used  Substance and Sexual Activity   Alcohol use: No   Drug use: No   Sexual activity: Never  Other Topics Concern   Not on file  Social History Narrative   Not on file   Social Determinants of Health   Financial Resource Strain: Low Risk  (11/13/2021)   Overall Financial Resource Strain (CARDIA)    Difficulty of Paying Living Expenses: Not hard at all  Food Insecurity: No Food Insecurity (03/19/2022)   Hunger Vital Sign    Worried About Running Out of Food in the Last Year: Never  true    Ran Out of Food in the Last Year: Never true  Transportation Needs: No Transportation Needs (03/19/2022)   PRAPARE - Administrator, Civil Service (Medical): No    Lack of Transportation (Non-Medical): No  Physical Activity: Sufficiently Active (11/13/2021)   Exercise Vital Sign    Days of Exercise per Week: 3 days    Minutes of Exercise per Session: 60 min  Stress: No Stress Concern Present (11/13/2021)   [employer] of Occupational Health - Occupational Stress Questionnaire    Feeling of Stress : Only a little  Social Connections: Socially Integrated (09/23/2020)   Social Connection and Isolation Panel [NHANES]    Frequency of Communication with Friends and Family: Three times a week    Frequency of Social Gatherings with Friends and Family: Three times a week    Attends Religious Services: More than 4 times per year    Active Member of Clubs or Organizations: Yes    Attends Banker Meetings: 1 to 4 times per year    Marital Status: Married    MEDICATIONS:  Current Outpatient Medications  Medication Sig Dispense Refill   Accu-Chek Softclix Lancets lancets TEST BLOOD SUGAR TWICE DAILY. 100 each 3   acetaminophen (TYLENOL) 650 MG CR tablet Take 1,300 mg by mouth every 8 (eight) hours as needed for pain.     amLODipine (NORVASC) 5 MG tablet TAKE ONE (1) TABLET EACH DAY 90 tablet 1   ascorbic acid (VITAMIN C) 500 MG tablet Take 500 mg by mouth daily.     cetirizine (ZYRTEC) 10 MG tablet Take 10 mg by mouth daily as needed for allergies.      Flaxseed, Linseed, (FLAX SEED OIL) 1000 MG CAPS Take 1,000 mg by mouth daily.     furosemide (  LASIX) 40 MG tablet Take 0.5 tablets (20 mg total) by mouth daily as needed. For fluid 30 tablet 3   glucose blood (ACCU-CHEK AVIVA PLUS) test strip TEST BLOOD SUGAR TWICE DAILY. 50 strip 1   Iron-Vitamins (GERITOL COMPLETE PO) Take 1 tablet by mouth daily.     Lancet Devices (ACCU-CHEK SOFTCLIX) lancets 1 each by Other  route 2 (two) times daily. E11.9 100 each 3   levothyroxine (SYNTHROID) 100 MCG tablet Take 1 tablet (100 mcg total) by mouth daily. 90 tablet 3   losartan-hydrochlorothiazide (HYZAAR) 100-12.5 MG tablet TAKE ONE (1) TABLET EACH DAY 90 tablet 0   Magnesium 250 MG TABS Take 250 mg by mouth daily.     meclizine (ANTIVERT) 25 MG tablet Take 25 mg by mouth 3 (three) times daily as needed for dizziness.      metoprolol succinate (TOPROL-XL) 50 MG 24 hr tablet TAKE ONE (1) TABLET BY MOUTH EVERY DAY 90 tablet 1   potassium chloride (KLOR-CON) 10 MEQ tablet TAKE ONE (1) TABLET EACH DAY 90 tablet 0   Propylene Glycol (SYSTANE BALANCE) 0.6 % SOLN Place 1 drop into both eyes daily as needed (dry eyes).     RABEprazole (ACIPHEX) 20 MG tablet TAKE ONE (1) TABLET EACH DAY 90 tablet 0   rosuvastatin (CRESTOR) 10 MG tablet Take 10 mg by mouth daily.     rosuvastatin (CRESTOR) 40 MG tablet TAKE ONE (1) TABLET EACH DAY 90 tablet 1   SitaGLIPtin-MetFORMIN HCl (JANUMET XR) 50-500 MG TB24 TAKE ONE (1) TABLET EACH DAY 30 tablet 5   solifenacin (VESICARE) 5 MG tablet TAKE ONE (1) TABLET BY MOUTH EVERY DAY 90 tablet 0   valACYclovir (VALTREX) 1000 MG tablet TAKE 2 TABS TWICE DAILY FOR 1 DAY THEN AS NEEDED (Patient taking differently: Take 1,000-2,000 mg by mouth daily as needed (cold sores).) 30 tablet 1   methocarbamol (ROBAXIN) 500 MG tablet Take 1 tablet (500 mg total) by mouth every 6 (six) hours as needed for muscle spasms. (Patient not taking: Reported on 10/24/2022) 40 tablet 0   oxyCODONE (OXY IR/ROXICODONE) 5 MG immediate release tablet Take 1-2 tablets (5-10 mg total) by mouth every 6 (six) hours as needed for severe pain. (Patient not taking: Reported on 08/09/2022) 42 tablet 0   traMADol (ULTRAM) 50 MG tablet Take 1-2 tablets (50-100 mg total) by mouth every 6 (six) hours as needed for moderate pain. (Patient not taking: Reported on 10/24/2022) 40 tablet 0   No current facility-administered medications for this  visit.    PHYSICAL EXAM: Vitals:   10/24/22 1111  BP: (!) 120/90  Pulse: 84  SpO2: 99%  Weight: 236 lb 6.4 oz (107.2 kg)  Height: 5\' 4"  (1.626 m)   Body mass index is 40.58 kg/m.  Wt Readings from Last 3 Encounters:  10/24/22 236 lb 6.4 oz (107.2 kg)  08/09/22 232 lb (105.2 kg)  03/19/22 225 lb (102.1 kg)    General: Well developed, well nourished female in no apparent distress.  HEENT: AT/[place], no external lesions.  Eyes: Conjunctiva clear and no icterus. Neck: Neck supple  Lungs: Respirations not labored Neurologic: Alert, oriented, normal speech Extremities / Skin: Dry. No sores or rashes noted.  Psychiatric: Does not appear depressed or anxious  Diabetic Foot Exam - Simple   No data filed    LABS Reviewed Lab Results  Component Value Date   HGBA1C 6.3 (A) 10/24/2022   HGBA1C 6.3 (A) 08/09/2022   HGBA1C 5.9 (H) 03/06/2022  No results found for: "FRUCTOSAMINE" Lab Results  Component Value Date   CHOL 172 10/06/2021   HDL 84.30 10/06/2021   LDLCALC 73 10/06/2021   LDLDIRECT 195.4 04/15/2012   TRIG 71.0 10/06/2021   CHOLHDL 2 10/06/2021   Lab Results  Component Value Date   MICRALBCREAT 0.8 02/02/2021   MICRALBCREAT 0.7 02/02/2020   Lab Results  Component Value Date   CREATININE 1.22 (H) 08/09/2022   Lab Results  Component Value Date   GFR 42.15 (L) 08/09/2022    ASSESSMENT / PLAN  1. Hypothyroidism, postablative   2. Type 2 diabetes mellitus with obesity (HCC)   3. H/O Graves' disease     Diabetes Mellitus type 2, complicated by no known complications. - Diabetic status / severity: Controlled.  Lab Results  Component Value Date   HGBA1C 6.3 (A) 10/24/2022    - Hemoglobin A1c goal : <7%  - Medications: No change  I) Janumet XR 50/500 mg 1 tablet daily.  - Home glucose testing: Daily in the morning fasting. - Discussed/ Gave Hypoglycemia treatment plan.  # Consult : not required at this time.   # Annual urine for microalbuminuria/  creatinine ratio, no microalbuminuria currently, continue ACE/ARB /losartan. Last  Lab Results  Component Value Date   MICRALBCREAT 0.8 02/02/2021    # Foot check nightly.  # Annual dilated diabetic eye exams.   - Diet: Make healthy diabetic food choices - Life style / activity / exercise: Discussed.  2. Blood pressure  -  BP Readings from Last 1 Encounters:  10/24/22 (!) 120/90    - Control is in target.  - No change in current plans.  3. Lipid status / Hyperlipidemia - Last  Lab Results  Component Value Date   LDLCALC 73 10/06/2021   - Continue rosuvastatin 40 mg daily.  Managed by primary care provider.  # Postablative hypothyroidism -Currently taking levothyroxine 100 mcg daily. -She is clinically euthyroid today. -Check thyroid function test.  Diagnoses and all orders for this visit:  Hypothyroidism, postablative -     T4, free; Future -     TSH; Future -     POCT glycosylated hemoglobin (Hb A1C) -     TSH -     T4, free  Type 2 diabetes mellitus with obesity (HCC)  H/O Graves' disease    DISPOSITION Follow up in clinic in 3 months suggested.   All questions answered and patient verbalized understanding of the plan.  Dawn Shamyah Stantz, MD [facility] Endocrinology [facility] [address] [place], [address] [place], [place] [address] Phone # [phone]  At least part of this note was generated using voice recognition software. Inadvertent word errors may have occurred, which were not recognized during the proofreading process.

## 2022-10-26 ENCOUNTER — Ambulatory Visit: Payer: Medicare Other | Attending: Student | Admitting: Physical Therapy

## 2022-10-26 DIAGNOSIS — M62830 Muscle spasm of back: Secondary | ICD-10-CM

## 2022-10-26 DIAGNOSIS — R293 Abnormal posture: Secondary | ICD-10-CM | POA: Diagnosis present

## 2022-10-26 DIAGNOSIS — M5459 Other low back pain: Secondary | ICD-10-CM | POA: Diagnosis present

## 2022-10-26 NOTE — Therapy (Signed)
OUTPATIENT PHYSICAL THERAPY THORACOLUMBAR EVALUATION   Patient Name: Dawn Salas MRN: [id number] DOB:December 06, 1940, 82 y.o., female Today's Date: 10/26/2022  END OF SESSION:  PT End of Session - 10/26/22 1239     Visit Number 2    Number of Visits 12    Date for PT Re-Evaluation 12/03/22    Authorization Type FOTO.    PT Start Time 1145    PT Stop Time 1239    PT Time Calculation (min) 54 min    Activity Tolerance Patient tolerated treatment well    Behavior During Therapy WFL for tasks assessed/performed              Past Medical History:  Diagnosis Date   Allergy    Anxiety    Arthritis    Asthma    patient denies    Cataract    bilateral lens implants    Colon polyps    Depression    Diabetes mellitus    Type two   Diverticulitis    Double vision 2019   left eye   Family history of adverse reaction to anesthesia    daughter- N/V    Family history of ovarian cancer    Family history of pancreatic cancer    Frequency of urination    GERD (gastroesophageal reflux disease)    History of adverse reaction to anesthesia 03/19/2022   After regional block for TKA pt states she had uncontrollable frequent blinking and "zoned out" was unable to speak more than one or two words for about 10 minutes   History of hiatal hernia    Hyperglycemia    Hyperlipidemia    Hypertension    Hyperthyroidism    PT HAD BIPOSY -NEGATIVE.Marland Kitchen AND NOW JUST GETS CHECKED EACH YEAR .Marland Kitchen 2013 LAST TEST   SEES DR. [name].    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 DOES NOT USE C PAP   Palpitations    Peptic ulcer disease    Pneumonia    "walking pneumonia" hx of   Seizures (HCC) 2015   one siezure with brain surgery none since 2015    Sleep apnea    no CPAP   Urinary tract infection 03/2020   Vertigo    Past Surgical History:  Procedure Laterality Date   ABDOMINAL HYSTERECTOMY     ANTERIOR CERVICAL DECOMP/DISCECTOMY FUSION N/A  04/19/2021   Procedure: Anterior Cervical Discectomy and Fusion with Interbody Prosthesis, Plates/Screws Cervical Three-Four/Cervical Four-Five REMoval CERVICAL PLATE;  Surgeon: Tressie Stalker, MD;  Location: [facility] OR;  Service: Neurosurgery;  Laterality: N/A;  3C   BACK SURGERY     L SPIN 2003   NECK 2004   BRAIN MENINGIOMA EXCISION  08/2008   X 2   C5-C6 neck fusion     COLONOSCOPY  05/05/2009   pt states multiple polyps removed in both colonoscopies she has had   CRANIOTOMY  08/13/2011   Procedure: CRANIOTOMY TUMOR EXCISION;  Surgeon: Cristi Loron, MD;  Location: [facility];  Service: Neurosurgery;  Laterality: Bilateral;  Bifrontal craniotomy for tumor   DILATION AND CURETTAGE OF UTERUS     YEARS AGO   L4-L5 posterior la  2021   L3-L5 fusion   left foot plantar and hammertoe     right foot bunectomy     right knee arthroscopy     x 3   right shoulder rotator cuff  ROBOTIC ASSISTED BILATERAL SALPINGO OOPHERECTOMY N/A 08/25/2015   Procedure: XI ROBOTIC ASSISTED BILATERAL SALPINGO OOPHORECTOMY;  Surgeon: Laurette Schimke, MD;  Location: [facility] ORS;  Service: Gynecology;  Laterality: N/A;   TONSILLECTOMY     TOTAL KNEE ARTHROPLASTY Right 03/19/2022   Procedure: TOTAL KNEE ARTHROPLASTY;  Surgeon: Ollen Gross, MD;  Location: [facility] ORS;  Service: Orthopedics;  Laterality: Right;   TUBAL LIGATION     WRIST SURGERY Right    Patient Active Problem List   Diagnosis Date Noted   OA (osteoarthritis) of knee 03/19/2022   Cervical spondylosis with myelopathy and radiculopathy 04/19/2021   Genetic testing 08/01/2020   Family history of ovarian cancer 07/14/2020   Colon polyps 07/14/2020   Family history of pancreatic cancer 07/14/2020   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   CKD (chronic kidney disease) stage 3, GFR 30-59 ml/min (HCC) 10/01/2017   History of normocytic normochromic anemia 11/25/2015   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   Spondylolisthesis of lumbar region 12/23/2013   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   Hyperthyroidism 05/25/2013   Obesity (BMI 30-39.9) 12/30/2012   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HCC) 08/21/2011   Meningioma (HCC) 08/13/2011   DIZZINESS 11/02/2009   Type 2 diabetes mellitus, controlled (HCC) 05/20/2009   HYPOKALEMIA 05/02/2009   DEPRESSION 06/24/2008   GERD 06/24/2008   PEPTIC ULCER DISEASE 06/24/2008   TMJ SYNDROME 06/09/2008   Hyperlipemia 11/08/2006   OBSTRUCTIVE SLEEP APNEA 11/08/2006   Essential hypertension 11/08/2006   ALLERGIC RHINITIS 11/08/2006   VOCAL CORD DISORDER 11/08/2006   ASTHMA 11/08/2006   TACHYARRHYTHMIA 11/08/2006    REFERRING PROVIDER: Val Eagle  REFERRING DIAG: Spinal stenosis of lumbar region with neurogenic claudication.  Rationale for Evaluation and Treatment: Rehabilitation  THERAPY DIAG:  Other low back pain  Abnormal posture  Muscle spasm of back  ONSET DATE: ~February of 2024 (after a right TKA).  SUBJECTIVE:                                                                                                                                                                                           SUBJECTIVE STATEMENT: Felt good after treatment.  Pain at a 5.  PERTINENT HISTORY:  Right total knee replacement, 2 previous lumbar surgeries.  PAIN:  Are you having pain? Yes: NPRS scale: 5/10 Pain location: Right low back. Pain description: Sore and numb. Aggravating factors: See above. Relieving factors: Sitting.  PRECAUTIONS: None  RED FLAGS: None   WEIGHT BEARING RESTRICTIONS: No  FALLS:  Has patient fallen in last 6 months? Yes. Number of falls 1.  Flipped out of  a Rocker (saw her MD).  LIVING ENVIRONMENT: Lives in: House/apartment Stairs: Uses railing. Has following equipment at home: Single point cane  OCCUPATION: Retired.  PLOF: Independent with basic ADLs  PATIENT  GOALS: Get pain down and do more around the house like she was doing before.  OBJECTIVE:   PATIENT SURVEYS:  FOTO 37.  POSTURE: rounded shoulders, forward head, decreased lumbar lordosis, and flexed trunk .   PALPATION:    Tender and taut to palpation over right lower lumbar musculature and right SIJ and proximal gluteal musculature.  LUMBAR/LE ROM:   The patient stand in 10 degrees of lumbar flexion and she can extend to 5 degrees.  Her active trunk flexion is near normal.  Her right knee is ~5 degrees from being equal to her left knee.  We discussed her continuing to use her Zero Knee to equalize her knee extension.     LOWER EXTREMITY MMT:    She has no bilateral LE strength deficits.  LUMBAR SPECIAL TESTS:  Negative right SLR test.    GAIT: Her gait is antalgic in nature and she walks in a flexed trunk posture with a straight cane.    TODAY'S TREATMENT:                                                                                                                              DATE:   10/26/22:                                     EXERCISE LOG  Exercise Repetitions and Resistance Comments  Nustep  Level 3 x 11 minutes.                    Patient in left SDLY position with folded pillow between  knees for comfort:  Combo e'stim/US at 1.50 W/CM2 x 7 minutes to patient's right SIJ/proximal gluteal musculature region f/b STW/M x 7 minutes f/b HMP and IFC at 80-150 Hz on 40% scan x 20 minutes. Normal modality response following removal of modality.    PATIENT EDUCATION:  Education details: Hip bridges.  Person educated: Patient Education method: Explanation Education comprehension: verbalized understanding, handout  HOME EXERCISE PROGRAM: HOME EXERCISE PROGRAM Created by [place] Phyliss Hulick Oct 4th, 2024 View at [email or web address] using code: [email or web address]  Page 1 of 1 1 Exercise Bridges While laying on your back, contract the low abdominals and lift from the  hips. Repeat 15 Times Hold 2 Seconds Complete 2 Sets Perform 2 Times a Day  ASSESSMENT:  CLINICAL IMPRESSION: Patient with decreased pain since last treatment.  She states the home exercise is helping.  Added hip bridges to HEP.  She did very well with treatment today though her right SIJ region remains tender to palpation.  OBJECTIVE IMPAIRMENTS: Abnormal gait, decreased activity tolerance, difficulty walking, decreased ROM, increased muscle spasms, postural dysfunction, and pain.  ACTIVITY LIMITATIONS: carrying, lifting, bending, standing, transfers, and locomotion level  PARTICIPATION LIMITATIONS: meal prep, cleaning, and laundry  PERSONAL FACTORS: Time since onset of injury/illness/exacerbation and 1-2 comorbidities: 2 prior lumbar surgeries and a right total knee replacement  are also affecting patient's functional outcome.   REHAB POTENTIAL: Good  CLINICAL DECISION MAKING: Stable/uncomplicated  EVALUATION COMPLEXITY: Low   GOALS:  SHORT TERM GOALS: Target date: 11/05/22  Ind with a initial HEP. Goal status: INITIAL  LONG TERM GOALS: Target date: 12/03/22  Ind with an advanced HEP.  Goal status: INITIAL  2.  Stand 20 minutes with pain not > 3-4/10.  Goal status: INITIAL  3.  Walk without antalgia.  Goal status: INITIAL  4.  Perform ADL's with pain not > 3-4/10.  Goal status: INITIAL   PLAN:  PT FREQUENCY: 2x/week  PT DURATION: 6 weeks  PLANNED INTERVENTIONS: Therapeutic exercises, Therapeutic activity, Gait training, Patient/Family education, Self Care, Dry Needling, Electrical stimulation, Cryotherapy, Moist heat, Ultrasound, and Manual therapy.  PLAN FOR NEXT SESSION: Combo e'stim/US, STW/M , draw-in progression.  Core exercise progression.  Postural improvement exercises. Draw-in progression,    Zaydenn Balaguer, [place], PT 10/26/2022, 1:24 PM

## 2022-11-01 ENCOUNTER — Ambulatory Visit: Payer: Medicare Other | Admitting: Physical Therapy

## 2022-11-01 DIAGNOSIS — M5459 Other low back pain: Secondary | ICD-10-CM | POA: Diagnosis not present

## 2022-11-01 DIAGNOSIS — R293 Abnormal posture: Secondary | ICD-10-CM

## 2022-11-01 DIAGNOSIS — M62830 Muscle spasm of back: Secondary | ICD-10-CM

## 2022-11-01 NOTE — Therapy (Signed)
OUTPATIENT PHYSICAL THERAPY THORACOLUMBAR EVALUATION   Patient Name: Dawn Salas MRN: [id number] DOB:Jan 20, 1941, 82 y.o., female Today's Date: 11/01/2022  END OF SESSION:  PT End of Session - 11/01/22 1235     Visit Number 3    Number of Visits 12    Date for PT Re-Evaluation 12/03/22    Authorization Type FOTO.    PT Start Time 1100    PT Stop Time 1159    PT Time Calculation (min) 59 min    Activity Tolerance Patient tolerated treatment well    Behavior During Therapy WFL for tasks assessed/performed              Past Medical History:  Diagnosis Date   Allergy    Anxiety    Arthritis    Asthma    patient denies    Cataract    bilateral lens implants    Colon polyps    Depression    Diabetes mellitus    Type two   Diverticulitis    Double vision 2019   left eye   Family history of adverse reaction to anesthesia    daughter- N/V    Family history of ovarian cancer    Family history of pancreatic cancer    Frequency of urination    GERD (gastroesophageal reflux disease)    History of adverse reaction to anesthesia 03/19/2022   After regional block for TKA pt states she had uncontrollable frequent blinking and "zoned out" was unable to speak more than one or two words for about 10 minutes   History of hiatal hernia    Hyperglycemia    Hyperlipidemia    Hypertension    Hyperthyroidism    PT HAD BIPOSY -NEGATIVE.Marland Kitchen AND NOW JUST GETS CHECKED EACH YEAR .Marland Kitchen 2013 LAST TEST   SEES DR. [name].    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 DOES NOT USE C PAP   Palpitations    Peptic ulcer disease    Pneumonia    "walking pneumonia" hx of   Seizures (HCC) 2015   one siezure with brain surgery none since 2015    Sleep apnea    no CPAP   Urinary tract infection 03/2020   Vertigo    Past Surgical History:  Procedure Laterality Date   ABDOMINAL HYSTERECTOMY     ANTERIOR CERVICAL DECOMP/DISCECTOMY FUSION N/A  04/19/2021   Procedure: Anterior Cervical Discectomy and Fusion with Interbody Prosthesis, Plates/Screws Cervical Three-Four/Cervical Four-Five REMoval CERVICAL PLATE;  Surgeon: Tressie Stalker, MD;  Location: [facility] OR;  Service: Neurosurgery;  Laterality: N/A;  3C   BACK SURGERY     L SPIN 2003   NECK 2004   BRAIN MENINGIOMA EXCISION  08/2008   X 2   C5-C6 neck fusion     COLONOSCOPY  05/05/2009   pt states multiple polyps removed in both colonoscopies she has had   CRANIOTOMY  08/13/2011   Procedure: CRANIOTOMY TUMOR EXCISION;  Surgeon: Cristi Loron, MD;  Location: [facility];  Service: Neurosurgery;  Laterality: Bilateral;  Bifrontal craniotomy for tumor   DILATION AND CURETTAGE OF UTERUS     YEARS AGO   L4-L5 posterior la  2021   L3-L5 fusion   left foot plantar and hammertoe     right foot bunectomy     right knee arthroscopy     x 3   right shoulder rotator cuff  ROBOTIC ASSISTED BILATERAL SALPINGO OOPHERECTOMY N/A 08/25/2015   Procedure: XI ROBOTIC ASSISTED BILATERAL SALPINGO OOPHORECTOMY;  Surgeon: Laurette Schimke, MD;  Location: [facility];  Service: Gynecology;  Laterality: N/A;   TONSILLECTOMY     TOTAL KNEE ARTHROPLASTY Right 03/19/2022   Procedure: TOTAL KNEE ARTHROPLASTY;  Surgeon: Ollen Gross, MD;  Location: [facility];  Service: Orthopedics;  Laterality: Right;   TUBAL LIGATION     WRIST SURGERY Right    Patient Active Problem List   Diagnosis Date Noted   OA (osteoarthritis) of knee 03/19/2022   Cervical spondylosis with myelopathy and radiculopathy 04/19/2021   Genetic testing 08/01/2020   Family history of ovarian cancer 07/14/2020   Colon polyps 07/14/2020   Family history of pancreatic cancer 07/14/2020   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   CKD (chronic kidney disease) stage 3, GFR 30-59 ml/min (HCC) 10/01/2017   History of normocytic normochromic anemia 11/25/2015   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   Spondylolisthesis of lumbar region 12/23/2013   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   Hyperthyroidism 05/25/2013   Obesity (BMI 30-39.9) 12/30/2012   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HCC) 08/21/2011   Meningioma (HCC) 08/13/2011   DIZZINESS 11/02/2009   Type 2 diabetes mellitus, controlled (HCC) 05/20/2009   HYPOKALEMIA 05/02/2009   DEPRESSION 06/24/2008   GERD 06/24/2008   PEPTIC ULCER DISEASE 06/24/2008   TMJ SYNDROME 06/09/2008   Hyperlipemia 11/08/2006   OBSTRUCTIVE SLEEP APNEA 11/08/2006   Essential hypertension 11/08/2006   ALLERGIC RHINITIS 11/08/2006   VOCAL CORD DISORDER 11/08/2006   ASTHMA 11/08/2006   TACHYARRHYTHMIA 11/08/2006    REFERRING PROVIDER: Val Eagle  REFERRING DIAG: Spinal stenosis of lumbar region with neurogenic claudication.  Rationale for Evaluation and Treatment: Rehabilitation  THERAPY DIAG:  Other low back pain  Abnormal posture  Muscle spasm of back  ONSET DATE: ~February of 2024 (after a right TKA).  SUBJECTIVE:                                                                                                                                                                                           SUBJECTIVE STATEMENT: Doing better.  Pain at a 3 today. PERTINENT HISTORY:  Right total knee replacement, 2 previous lumbar surgeries.  PAIN:  Are you having pain? Yes: NPRS scale: 3/10 Pain location: Right low back. Pain description: Sore and numb. Aggravating factors: See above. Relieving factors: Sitting.  PRECAUTIONS: None  RED FLAGS: None   WEIGHT BEARING RESTRICTIONS: No  FALLS:  Has patient fallen in last 6 months? Yes. Number of falls 1.  Flipped out of a Rocker (  saw her MD).  LIVING ENVIRONMENT: Lives in: House/apartment Stairs: Uses railing. Has following equipment at home: Single point cane  OCCUPATION: Retired.  PLOF: Independent with basic ADLs  PATIENT GOALS: Get  pain down and do more around the house like she was doing before.  OBJECTIVE:   PATIENT SURVEYS:  FOTO 37.  POSTURE: rounded shoulders, forward head, decreased lumbar lordosis, and flexed trunk .   PALPATION:    Tender and taut to palpation over right lower lumbar musculature and right SIJ and proximal gluteal musculature.  LUMBAR/LE ROM:   The patient stand in 10 degrees of lumbar flexion and she can extend to 5 degrees.  Her active trunk flexion is near normal.  Her right knee is ~5 degrees from being equal to her left knee.  We discussed her continuing to use her Zero Knee to equalize her knee extension.     LOWER EXTREMITY MMT:    She has no bilateral LE strength deficits.  LUMBAR SPECIAL TESTS:  Negative right SLR test.    GAIT: Her gait is antalgic in nature and she walks in a flexed trunk posture with a straight cane.    TODAY'S TREATMENT:                                                                                                                              DATE:    11/01/22:                                       EXERCISE LOG  Exercise Repetitions and Resistance Comments  Nustep Level 3 x 15 minutes   Standing with bilateral UE support on countertop and draw-in Bilateral LE extension x 10 reps each side.   Patient in left SDLY position with folded pillow between  knees for comfort:   STW/M x 10 minutes f/b HMP and IFC at 80-150 Hz on 40% scan x 20 minutes. Normal modality response following removal of modality.  10/26/22:                                     EXERCISE LOG  Exercise Repetitions and Resistance Comments  Nustep  Level 3 x 11 minutes.                    Patient in left SDLY position with folded pillow between  knees for comfort:  Combo e'stim/US at 1.50 W/CM2 x 7 minutes to patient's right SIJ/proximal gluteal musculature region f/b STW/M x 7 minutes f/b HMP and IFC at 80-150 Hz on 40% scan x 20 minutes. Normal modality response following  removal of modality.    PATIENT EDUCATION:  Education details: As below. Person educated: Patient Education method: Explanation Education comprehension: verbalized understanding.  HOME EXERCISE PROGRAM: Bilateral LE  extension with draw-in with bilateral UE support on countertop.  ASSESSMENT:  CLINICAL IMPRESSION: Patient is progressing very well with a significant reduction in her pain-level.  She is compliant to her HEP.  Added bilateral LE extension with draw-in with bilateral UE support on countertop which she performed with excellent technique and without complaint.   OBJECTIVE IMPAIRMENTS: Abnormal gait, decreased activity tolerance, difficulty walking, decreased ROM, increased muscle spasms, postural dysfunction, and pain.   ACTIVITY LIMITATIONS: carrying, lifting, bending, standing, transfers, and locomotion level  PARTICIPATION LIMITATIONS: meal prep, cleaning, and laundry  PERSONAL FACTORS: Time since onset of injury/illness/exacerbation and 1-2 comorbidities: 2 prior lumbar surgeries and a right total knee replacement  are also affecting patient's functional outcome.   REHAB POTENTIAL: Good  CLINICAL DECISION MAKING: Stable/uncomplicated  EVALUATION COMPLEXITY: Low   GOALS:  SHORT TERM GOALS: Target date: 11/05/22  Ind with a initial HEP. Goal status: INITIAL  LONG TERM GOALS: Target date: 12/03/22  Ind with an advanced HEP.  Goal status: INITIAL  2.  Stand 20 minutes with pain not > 3-4/10.  Goal status: INITIAL  3.  Walk without antalgia.  Goal status: INITIAL  4.  Perform ADL's with pain not > 3-4/10.  Goal status: INITIAL   PLAN:  PT FREQUENCY: 2x/week  PT DURATION: 6 weeks  PLANNED INTERVENTIONS: Therapeutic exercises, Therapeutic activity, Gait training, Patient/Family education, Self Care, Dry Needling, Electrical stimulation, Cryotherapy, Moist heat, Ultrasound, and Manual therapy.  PLAN FOR NEXT SESSION: Combo e'stim/US, STW/M ,  draw-in progression.  Core exercise progression.  Postural improvement exercises. Draw-in progression,    Onie Kasparek, [place], PT 11/01/2022, 12:44 PM

## 2022-11-05 ENCOUNTER — Ambulatory Visit: Payer: Medicare Other | Admitting: Physical Therapy

## 2022-11-05 DIAGNOSIS — M62830 Muscle spasm of back: Secondary | ICD-10-CM

## 2022-11-05 DIAGNOSIS — M5459 Other low back pain: Secondary | ICD-10-CM | POA: Diagnosis not present

## 2022-11-05 DIAGNOSIS — R293 Abnormal posture: Secondary | ICD-10-CM

## 2022-11-05 NOTE — Therapy (Signed)
OUTPATIENT PHYSICAL THERAPY THORACOLUMBAR EVALUATION   Patient Name: AVANA JANIAK MRN: [id number] DOB:03-12-1940, 82 y.o., female Today's Date: 11/05/2022  END OF SESSION:  PT End of Session - 11/05/22 1241     Visit Number 4    Number of Visits 12    Date for PT Re-Evaluation 12/03/22    Authorization Type FOTO.    PT Start Time 1100    PT Stop Time 1157    PT Time Calculation (min) 57 min    Activity Tolerance Patient tolerated treatment well    Behavior During Therapy WFL for tasks assessed/performed              Past Medical History:  Diagnosis Date   Allergy    Anxiety    Arthritis    Asthma    patient denies    Cataract    bilateral lens implants    Colon polyps    Depression    Diabetes mellitus    Type two   Diverticulitis    Double vision 2019   left eye   Family history of adverse reaction to anesthesia    daughter- N/V    Family history of ovarian cancer    Family history of pancreatic cancer    Frequency of urination    GERD (gastroesophageal reflux disease)    History of adverse reaction to anesthesia 03/19/2022   After regional block for TKA pt states she had uncontrollable frequent blinking and "zoned out" was unable to speak more than one or two words for about 10 minutes   History of hiatal hernia    Hyperglycemia    Hyperlipidemia    Hypertension    Hyperthyroidism    PT HAD BIPOSY -NEGATIVE.Marland Kitchen AND NOW JUST GETS CHECKED EACH YEAR .Marland Kitchen 2013 LAST TEST   SEES DR. [name].    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 DOES NOT USE C PAP   Palpitations    Peptic ulcer disease    Pneumonia    "walking pneumonia" hx of   Seizures (HCC) 2015   one siezure with brain surgery none since 2015    Sleep apnea    no CPAP   Urinary tract infection 03/2020   Vertigo    Past Surgical History:  Procedure Laterality Date   ABDOMINAL HYSTERECTOMY     ANTERIOR CERVICAL DECOMP/DISCECTOMY FUSION N/A  04/19/2021   Procedure: Anterior Cervical Discectomy and Fusion with Interbody Prosthesis, Plates/Screws Cervical Three-Four/Cervical Four-Five REMoval CERVICAL PLATE;  Surgeon: Tressie Stalker, MD;  Location: [facility] OR;  Service: Neurosurgery;  Laterality: N/A;  3C   BACK SURGERY     L SPIN 2003   NECK 2004   BRAIN MENINGIOMA EXCISION  08/2008   X 2   C5-C6 neck fusion     COLONOSCOPY  05/05/2009   pt states multiple polyps removed in both colonoscopies she has had   CRANIOTOMY  08/13/2011   Procedure: CRANIOTOMY TUMOR EXCISION;  Surgeon: Cristi Loron, MD;  Location: [facility];  Service: Neurosurgery;  Laterality: Bilateral;  Bifrontal craniotomy for tumor   DILATION AND CURETTAGE OF UTERUS     YEARS AGO   L4-L5 posterior la  2021   L3-L5 fusion   left foot plantar and hammertoe     right foot bunectomy     right knee arthroscopy     x 3   right shoulder rotator cuff  ROBOTIC ASSISTED BILATERAL SALPINGO OOPHERECTOMY N/A 08/25/2015   Procedure: XI ROBOTIC ASSISTED BILATERAL SALPINGO OOPHORECTOMY;  Surgeon: Laurette Schimke, MD;  Location: [facility] ORS;  Service: Gynecology;  Laterality: N/A;   TONSILLECTOMY     TOTAL KNEE ARTHROPLASTY Right 03/19/2022   Procedure: TOTAL KNEE ARTHROPLASTY;  Surgeon: Ollen Gross, MD;  Location: [facility] ORS;  Service: Orthopedics;  Laterality: Right;   TUBAL LIGATION     WRIST SURGERY Right    Patient Active Problem List   Diagnosis Date Noted   OA (osteoarthritis) of knee 03/19/2022   Cervical spondylosis with myelopathy and radiculopathy 04/19/2021   Genetic testing 08/01/2020   Family history of ovarian cancer 07/14/2020   Colon polyps 07/14/2020   Family history of pancreatic cancer 07/14/2020   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   CKD (chronic kidney disease) stage 3, GFR 30-59 ml/min (HCC) 10/01/2017   History of normocytic normochromic anemia 11/25/2015   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   Spondylolisthesis of lumbar region 12/23/2013   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   Hyperthyroidism 05/25/2013   Obesity (BMI 30-39.9) 12/30/2012   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HCC) 08/21/2011   Meningioma (HCC) 08/13/2011   DIZZINESS 11/02/2009   Type 2 diabetes mellitus, controlled (HCC) 05/20/2009   HYPOKALEMIA 05/02/2009   DEPRESSION 06/24/2008   GERD 06/24/2008   PEPTIC ULCER DISEASE 06/24/2008   TMJ SYNDROME 06/09/2008   Hyperlipemia 11/08/2006   OBSTRUCTIVE SLEEP APNEA 11/08/2006   Essential hypertension 11/08/2006   ALLERGIC RHINITIS 11/08/2006   VOCAL CORD DISORDER 11/08/2006   ASTHMA 11/08/2006   TACHYARRHYTHMIA 11/08/2006    REFERRING PROVIDER: Val Eagle  REFERRING DIAG: Spinal stenosis of lumbar region with neurogenic claudication.  Rationale for Evaluation and Treatment: Rehabilitation  THERAPY DIAG:  Other low back pain  Abnormal posture  Muscle spasm of back  ONSET DATE: ~February of 2024 (after a right TKA).  SUBJECTIVE:                                                                                                                                                                                           SUBJECTIVE STATEMENT: Those exercises are really helping.  Pain is a 2.  PERTINENT HISTORY:  Right total knee replacement, 2 previous lumbar surgeries.  PAIN:  Are you having pain? Yes: NPRS scale: 2/10 Pain location: Right low back. Pain description: Sore and numb. Aggravating factors: See above. Relieving factors: Sitting.  PRECAUTIONS: None  RED FLAGS: None   WEIGHT BEARING RESTRICTIONS: No  FALLS:  Has patient fallen in last 6 months? Yes. Number of falls 1.  Flipped out  of a Rocker (saw her MD).  LIVING ENVIRONMENT: Lives in: House/apartment Stairs: Uses railing. Has following equipment at home: Single point cane  OCCUPATION: Retired.  PLOF: Independent with basic  ADLs  PATIENT GOALS: Get pain down and do more around the house like she was doing before.  OBJECTIVE:   PATIENT SURVEYS:  FOTO 37.  POSTURE: rounded shoulders, forward head, decreased lumbar lordosis, and flexed trunk .   PALPATION:    Tender and taut to palpation over right lower lumbar musculature and right SIJ and proximal gluteal musculature.  LUMBAR/LE ROM:   The patient stand in 10 degrees of lumbar flexion and she can extend to 5 degrees.  Her active trunk flexion is near normal.  Her right knee is ~5 degrees from being equal to her left knee.  We discussed her continuing to use her Zero Knee to equalize her knee extension.     LOWER EXTREMITY MMT:    She has no bilateral LE strength deficits.  LUMBAR SPECIAL TESTS:  Negative right SLR test.    GAIT: Her gait is antalgic in nature and she walks in a flexed trunk posture with a straight cane.    TODAY'S TREATMENT:                                                                                                                              DATE:   11/05/22:                                       EXERCISE LOG  Exercise Repetitions and Resistance Comments  Nustep  Level 4 x 15 minutes.                    Patient in left SDLY position with folded pillow between  knees for comfort:   STW/M x 10 minutes f/b HMP and IFC at 80-150 Hz on 40% scan x 20 minutes. Normal modality response following removal of modality.   11/01/22:                                       EXERCISE LOG  Exercise Repetitions and Resistance Comments  Nustep Level 3 x 15 minutes   Standing with bilateral UE support on countertop and draw-in Bilateral LE extension x 10 reps each side.   Patient in left SDLY position with folded pillow between  knees for comfort:   STW/M x 10 minutes f/b HMP and IFC at 80-150 Hz on 40% scan x 20 minutes. Normal modality response following removal of modality.  10/26/22:  EXERCISE LOG  Exercise Repetitions and Resistance Comments  Nustep  Level 3 x 11 minutes.                    Patient in left SDLY position with folded pillow between  knees for comfort:  Combo e'stim/US at 1.50 W/CM2 x 7 minutes to patient's right SIJ/proximal gluteal musculature region f/b STW/M x 7 minutes f/b HMP and IFC at 80-150 Hz on 40% scan x 20 minutes. Normal modality response following removal of modality.    PATIENT EDUCATION:  Education details: As below. Person educated: Patient Education method: Explanation Education comprehension: verbalized understanding.  HOME EXERCISE PROGRAM: Bilateral LE extension with draw-in with bilateral UE support on countertop.  ASSESSMENT:  CLINICAL IMPRESSION: Patient is doing very well.  She is compliant to her HEP and remarks how effective they are.  She has some continued palpable pain in her right SIJ/gluteal musculature with excellent response to treatment and no pain complaints at the conclusion of treatment today.  OBJECTIVE IMPAIRMENTS: Abnormal gait, decreased activity tolerance, difficulty walking, decreased ROM, increased muscle spasms, postural dysfunction, and pain.   ACTIVITY LIMITATIONS: carrying, lifting, bending, standing, transfers, and locomotion level  PARTICIPATION LIMITATIONS: meal prep, cleaning, and laundry  PERSONAL FACTORS: Time since onset of injury/illness/exacerbation and 1-2 comorbidities: 2 prior lumbar surgeries and a right total knee replacement  are also affecting patient's functional outcome.   REHAB POTENTIAL: Good  CLINICAL DECISION MAKING: Stable/uncomplicated  EVALUATION COMPLEXITY: Low   GOALS:  SHORT TERM GOALS: Target date: 11/05/22  Ind with a initial HEP. Goal status: INITIAL  LONG TERM GOALS: Target date: 12/03/22  Ind with an advanced HEP.  Goal status: INITIAL  2.  Stand 20 minutes with pain not > 3-4/10.  Goal status: INITIAL  3.  Walk without antalgia.  Goal status:  INITIAL  4.  Perform ADL's with pain not > 3-4/10.  Goal status: INITIAL   PLAN:  PT FREQUENCY: 2x/week  PT DURATION: 6 weeks  PLANNED INTERVENTIONS: Therapeutic exercises, Therapeutic activity, Gait training, Patient/Family education, Self Care, Dry Needling, Electrical stimulation, Cryotherapy, Moist heat, Ultrasound, and Manual therapy.  PLAN FOR NEXT SESSION: Combo e'stim/US, STW/M , draw-in progression.  Core exercise progression.  Postural improvement exercises. Draw-in progression,    Marvine Encalade, [place], PT 11/05/2022, 12:50 PM

## 2022-11-12 ENCOUNTER — Ambulatory Visit: Payer: Medicare Other | Attending: Nurse Practitioner | Admitting: Nurse Practitioner

## 2022-11-12 ENCOUNTER — Encounter: Payer: Self-pay | Admitting: Nurse Practitioner

## 2022-11-12 VITALS — BP 130/80 | HR 76 | Ht 64.0 in | Wt 239.2 lb

## 2022-11-12 DIAGNOSIS — E785 Hyperlipidemia, unspecified: Secondary | ICD-10-CM

## 2022-11-12 DIAGNOSIS — R0989 Other specified symptoms and signs involving the circulatory and respiratory systems: Secondary | ICD-10-CM | POA: Diagnosis not present

## 2022-11-12 DIAGNOSIS — E669 Obesity, unspecified: Secondary | ICD-10-CM

## 2022-11-12 DIAGNOSIS — R600 Localized edema: Secondary | ICD-10-CM

## 2022-11-12 DIAGNOSIS — I10 Essential (primary) hypertension: Secondary | ICD-10-CM | POA: Diagnosis not present

## 2022-11-12 NOTE — Patient Instructions (Addendum)

## 2022-11-12 NOTE — Progress Notes (Unsigned)
Cardiology Office Note:    Date:  11/12/2022 ID:  Dawn Salas, DOB 1940-03-27, MRN [id number] PCP:  Kristian Covey, MD [facility] Providers Cardiologist:  Dina Rich, MD    Referring MD: Kristian Covey, MD  CC: Here for follow-up   History of Present Illness:    Dawn Salas is a 82 y.o. female with a PMH of palpitations, HTN, HLD, hypothyroidism, OSA, brain meningiomas (non-cancerous - follows Neurosurgery), who presents today for 6 month follow-up.   Past cardiovascular history includes nuclear stress test in 2013 at [facility], no ischemia noted.  Echocardiogram that same year revealed EF 60 to 65%, no significant valvular abnormality, no WMA's.  First evaluated by Dr. Dina Rich in February 2023.  Was doing well at that time.   Underwent right TKA on March 19, 2022.  Today she presents for follow-up.  Overall doing well from a cardiac perspective. Denies any chest pain, shortness of breath, palpitations, syncope, presyncope, dizziness, orthopnea, PND, swelling or significant weight changes, acute bleeding, or claudication.  Says she is looking to possibly start water aerobics after her surgery and get more active.  Please see the history of present illness.    All other systems reviewed and are negative.  EKGs/Labs/Other Studies Reviewed:    The following studies were reviewed today:  EKG:  EKG is not ordered today.    Echocardiogram stress exercise test on June 28, 2011: Normal study.  Echocardiogram on Jun 20, 2011: Normal EF, no significant valvular abnormalities.  Risk Assessment/Calculations:    The 10-year ASCVD risk score (Arnett DK, et al., 2019) is: 43.1%   Values used to calculate the score:     Age: 36 years     Sex: Female     Is Non-Hispanic African American: Yes     Diabetic: Yes     Tobacco smoker: No     Systolic Blood Pressure: 130 mmHg     Is BP treated: Yes     HDL Cholesterol: 84.3 mg/dL     Total  Cholesterol: 172 mg/dL       Physical Exam:    VS:  BP 130/80   Pulse 76   Ht 5\' 4"  (1.626 m)   Wt 239 lb 3.2 oz (108.5 kg)   SpO2 99%   BMI 41.06 kg/m     Wt Readings from Last 3 Encounters:  11/12/22 239 lb 3.2 oz (108.5 kg)  10/24/22 236 lb 6.4 oz (107.2 kg)  08/09/22 232 lb (105.2 kg)     GEN: Obese, 82 year old female in no acute distress HEENT: Normal NECK: No JVD; minimal left sided carotid bruit noted on exam, no right sided carotid bruit noted CARDIAC: S1/S2, RRR, no murmurs, rubs, gallops; 2+ pulses along radials, equal bilaterally.  1+ PT throughout RESPIRATORY:  Clear to auscultation without rales, wheezing or rhonchi  MUSCULOSKELETAL: Nonpitting, lower extremity edema bilaterally; No deformity  SKIN: Warm and dry NEUROLOGIC:  Alert and oriented x 3 PSYCHIATRIC:  Normal affect   ASSESSMENT & PLAN:     1.  Hypertension BP today stable and at goal.  BP well-controlled at home.  Continue current medication regimen. Discussed to monitor BP at home at least 2 hours after medications and sitting for 5-10 minutes. Heart healthy diet and regular cardiovascular exercise encouraged.   2. Hyperlipidemia Lipid panel in September 2023 unremarkable.  Continue rosuvastatin.  Offered to repeat labs, but patient requests to have these done with her endocrinologist at upcoming  office appointment.  Requested to have these faxed to our office when these are available.  Heart healthy diet and regular cardiovascular exercise encouraged.   3.  Carotid bruit Left-sided carotid bruit noted on exam.  Denies any symptoms.  She states she had a carotid Dopplers/lower extremity vascular ultrasounds performed in 2012/2013 at [facility] in [place].  Will request these records.  Continue current medication regimen.  Heart healthy diet and regular cardiovascular exercise recommended.  At next visit, we will discuss/revisit arranging carotid Dopplers.  4.  Leg edema Chronic,  stable bilaterally.  Continue Lasix as needed for leg swelling.  Continue compression stockings.  Low-salt, heart healthy diet and regular cardiovascular exercise encouraged.   5. Obesity She is currently working with therapy.  Weight loss via diet and exercise encouraged. Discussed the impact being overweight would have on cardiovascular risk.  Recommended to discuss with surgeon about if she is allowed to participate in water aerobics.  She verbalized understanding.  6.  Disposition: Follow-up with Dr. Dina Rich or APP in 6 months or sooner if anything changes.  Medication Adjustments/Labs and Tests Ordered: Current medicines are reviewed at length with the patient today.  Concerns regarding medicines are outlined above.  No orders of the defined types were placed in this encounter.  No orders of the defined types were placed in this encounter.   Patient Instructions  Medication Instructions:  Your physician recommends that you continue on your current medications as directed. Please refer to the Current Medication list given to you today.  Labwork: None   Testing/Procedures: None   Follow-Up: Your physician recommends that you schedule a follow-up appointment in: 6 Months   Any Other Special Instructions Will Be Listed Below (If Applicable).  If you need a refill on your cardiac medications before your next appointment, please call your pharmacy.   Signed, Sharlene Dory, NP

## 2022-11-16 ENCOUNTER — Ambulatory Visit: Payer: Medicare Other

## 2022-11-16 DIAGNOSIS — R293 Abnormal posture: Secondary | ICD-10-CM

## 2022-11-16 DIAGNOSIS — M5459 Other low back pain: Secondary | ICD-10-CM | POA: Diagnosis not present

## 2022-11-16 DIAGNOSIS — M62830 Muscle spasm of back: Secondary | ICD-10-CM

## 2022-11-16 NOTE — Therapy (Signed)
OUTPATIENT PHYSICAL THERAPY THORACOLUMBAR TREATMENT   Patient Name: Dawn Salas MRN: [id number] DOB:15-Apr-1940, 82 y.o., female Today's Date: 11/16/2022  END OF SESSION:  PT End of Session - 11/16/22 1102     Visit Number 5    Number of Visits 12    Date for PT Re-Evaluation 12/03/22    Authorization Type FOTO.    PT Start Time 1100    PT Stop Time 1202    PT Time Calculation (min) 62 min    Activity Tolerance Patient tolerated treatment well    Behavior During Therapy WFL for tasks assessed/performed              Past Medical History:  Diagnosis Date   Allergy    Anxiety    Arthritis    Asthma    patient denies    Cataract    bilateral lens implants    Colon polyps    Depression    Diabetes mellitus    Type two   Diverticulitis    Double vision 2019   left eye   Family history of adverse reaction to anesthesia    daughter- N/V    Family history of ovarian cancer    Family history of pancreatic cancer    Frequency of urination    GERD (gastroesophageal reflux disease)    History of adverse reaction to anesthesia 03/19/2022   After regional block for TKA pt states she had uncontrollable frequent blinking and "zoned out" was unable to speak more than one or two words for about 10 minutes   History of hiatal hernia    Hyperglycemia    Hyperlipidemia    Hypertension    Hyperthyroidism    PT HAD BIPOSY -NEGATIVE.Marland Kitchen AND NOW JUST GETS CHECKED EACH YEAR .Marland Kitchen 2013 LAST TEST   SEES DR. [name].    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 DOES NOT USE C PAP   Palpitations    Peptic ulcer disease    Pneumonia    "walking pneumonia" hx of   Seizures (HCC) 2015   one siezure with brain surgery none since 2015    Sleep apnea    no CPAP   Urinary tract infection 03/2020   Vertigo    Past Surgical History:  Procedure Laterality Date   ABDOMINAL HYSTERECTOMY     ANTERIOR CERVICAL DECOMP/DISCECTOMY FUSION N/A  04/19/2021   Procedure: Anterior Cervical Discectomy and Fusion with Interbody Prosthesis, Plates/Screws Cervical Three-Four/Cervical Four-Five REMoval CERVICAL PLATE;  Surgeon: Tressie Stalker, MD;  Location: [facility] OR;  Service: Neurosurgery;  Laterality: N/A;  3C   BACK SURGERY     L SPIN 2003   NECK 2004   BRAIN MENINGIOMA EXCISION  08/2008   X 2   C5-C6 neck fusion     COLONOSCOPY  05/05/2009   pt states multiple polyps removed in both colonoscopies she has had   CRANIOTOMY  08/13/2011   Procedure: CRANIOTOMY TUMOR EXCISION;  Surgeon: Cristi Loron, MD;  Location: [facility];  Service: Neurosurgery;  Laterality: Bilateral;  Bifrontal craniotomy for tumor   DILATION AND CURETTAGE OF UTERUS     YEARS AGO   L4-L5 posterior la  2021   L3-L5 fusion   left foot plantar and hammertoe     right foot bunectomy     right knee arthroscopy     x 3   right shoulder rotator cuff  ROBOTIC ASSISTED BILATERAL SALPINGO OOPHERECTOMY N/A 08/25/2015   Procedure: XI ROBOTIC ASSISTED BILATERAL SALPINGO OOPHORECTOMY;  Surgeon: Laurette Schimke, MD;  Location: [facility] ORS;  Service: Gynecology;  Laterality: N/A;   TONSILLECTOMY     TOTAL KNEE ARTHROPLASTY Right 03/19/2022   Procedure: TOTAL KNEE ARTHROPLASTY;  Surgeon: Ollen Gross, MD;  Location: [facility] ORS;  Service: Orthopedics;  Laterality: Right;   TUBAL LIGATION     WRIST SURGERY Right    Patient Active Problem List   Diagnosis Date Noted   OA (osteoarthritis) of knee 03/19/2022   Cervical spondylosis with myelopathy and radiculopathy 04/19/2021   Genetic testing 08/01/2020   Family history of ovarian cancer 07/14/2020   Colon polyps 07/14/2020   Family history of pancreatic cancer 07/14/2020   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   CKD (chronic kidney disease) stage 3, GFR 30-59 ml/min (HCC) 10/01/2017   History of normocytic normochromic anemia 11/25/2015   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   Spondylolisthesis of lumbar region 12/23/2013   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   Hyperthyroidism 05/25/2013   Obesity (BMI 30-39.9) 12/30/2012   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HCC) 08/21/2011   Meningioma (HCC) 08/13/2011   DIZZINESS 11/02/2009   Type 2 diabetes mellitus, controlled (HCC) 05/20/2009   HYPOKALEMIA 05/02/2009   DEPRESSION 06/24/2008   GERD 06/24/2008   PEPTIC ULCER DISEASE 06/24/2008   TMJ SYNDROME 06/09/2008   Hyperlipemia 11/08/2006   OBSTRUCTIVE SLEEP APNEA 11/08/2006   Essential hypertension 11/08/2006   ALLERGIC RHINITIS 11/08/2006   VOCAL CORD DISORDER 11/08/2006   ASTHMA 11/08/2006   TACHYARRHYTHMIA 11/08/2006    REFERRING PROVIDER: Val Eagle  REFERRING DIAG: Spinal stenosis of lumbar region with neurogenic claudication.  Rationale for Evaluation and Treatment: Rehabilitation  THERAPY DIAG:  Other low back pain  Abnormal posture  Muscle spasm of back  ONSET DATE: ~February of 2024 (after a right TKA).  SUBJECTIVE:                                                                                                                                                                                           SUBJECTIVE STATEMENT: Those exercises are really helping.  Pain is a 2.  PERTINENT HISTORY:  Right total knee replacement, 2 previous lumbar surgeries.  PAIN:  Are you having pain? Yes: NPRS scale: 2/10 Pain location: Right low back. Pain description: Sore and numb. Aggravating factors: See above. Relieving factors: Sitting.  PRECAUTIONS: None  RED FLAGS: None   WEIGHT BEARING RESTRICTIONS: No  FALLS:  Has patient fallen in last 6 months? Yes. Number of falls 1.  Flipped out  of a Rocker (saw her MD).  LIVING ENVIRONMENT: Lives in: House/apartment Stairs: Uses railing. Has following equipment at home: Single point cane  OCCUPATION: Retired.  PLOF: Independent with basic  ADLs  PATIENT GOALS: Get pain down and do more around the house like she was doing before.  OBJECTIVE:   PATIENT SURVEYS:  FOTO 37.  POSTURE: rounded shoulders, forward head, decreased lumbar lordosis, and flexed trunk .   PALPATION:    Tender and taut to palpation over right lower lumbar musculature and right SIJ and proximal gluteal musculature.  LUMBAR/LE ROM:   The patient stand in 10 degrees of lumbar flexion and she can extend to 5 degrees.  Her active trunk flexion is near normal.  Her right knee is ~5 degrees from being equal to her left knee.  We discussed her continuing to use her Zero Knee to equalize her knee extension.     LOWER EXTREMITY MMT:    She has no bilateral LE strength deficits.  LUMBAR SPECIAL TESTS:  Negative right SLR test.    GAIT: Her gait is antalgic in nature and she walks in a flexed trunk posture with a straight cane.    TODAY'S TREATMENT:                                                                                                                              DATE:  11/16/22:                                    EXERCISE LOG  Exercise Repetitions and Resistance Comments  Nustep Lvl 4 x 20 mins   [employer] 3 mins   Ball Rollouts 3 mins   LAQs 3# x 25 reps   Seated Marches 3# x 25 reps   Seated Hip Abduction Red x 3 mins   Seated Hip Adduction 3 mins    Blank cell = exercise not performed today   Modalities  Date:  Unattended Estim: Lumbar, IFC 80-150 Hz, 15 mins, Pain Hot Pack: Lumbar, 15 mins, Pain and Tone   11/05/22                                   EXERCISE LOG  Exercise Repetitions and Resistance Comments  Nustep  Level 4 x 15 minutes.                    Patient in left SDLY position with folded pillow between  knees for comfort:   STW/M x 10 minutes f/b HMP and IFC at 80-150 Hz on 40% scan x 20 minutes. Normal modality response following removal of modality.  11/01/22:  EXERCISE  LOG  Exercise Repetitions and Resistance Comments  Nustep Level 3 x 15 minutes   Standing with bilateral UE support on countertop and draw-in Bilateral LE extension x 10 reps each side.   Patient in left SDLY position with folded pillow between  knees for comfort:   STW/M x 10 minutes f/b HMP and IFC at 80-150 Hz on 40% scan x 20 minutes. Normal modality response following removal of modality.  PATIENT EDUCATION:  Education details: As below. Person educated: Patient Education method: Explanation Education comprehension: verbalized understanding.  HOME EXERCISE PROGRAM: Bilateral LE extension with draw-in with bilateral UE support on countertop.  ASSESSMENT:  CLINICAL IMPRESSION: Pt arrives for today's treatment session reporting 2/10 right low back pain.  Pt reports that the exercises she has been performing from last session has made her back feel better.  Pt increased FOTO score to 63 today.  Pt introduced to seated exercises today to increase strength and function.  Pt requiring min cues for proper technique.  Normal responses to estim and MH noted upon removal.  Pt denied any pain at completion of today's treatment session.  OBJECTIVE IMPAIRMENTS: Abnormal gait, decreased activity tolerance, difficulty walking, decreased ROM, increased muscle spasms, postural dysfunction, and pain.   ACTIVITY LIMITATIONS: carrying, lifting, bending, standing, transfers, and locomotion level  PARTICIPATION LIMITATIONS: meal prep, cleaning, and laundry  PERSONAL FACTORS: Time since onset of injury/illness/exacerbation and 1-2 comorbidities: 2 prior lumbar surgeries and a right total knee replacement  are also affecting patient's functional outcome.   REHAB POTENTIAL: Good  CLINICAL DECISION MAKING: Stable/uncomplicated  EVALUATION COMPLEXITY: Low   GOALS:  SHORT TERM GOALS: Target date: 11/05/22  Ind with a initial HEP. Goal status: MET  LONG TERM GOALS: Target date: 12/03/22  Ind with  an advanced HEP.  Goal status: IN PROGRESS  2.  Stand 20 minutes with pain not > 3-4/10.  Goal status: IN PROGRESS  3.  Walk without antalgia.  Goal status: IN PROGRESS  4.  Perform ADL's with pain not > 3-4/10.  Goal status: IN PROGRESS   PLAN:  PT FREQUENCY: 2x/week  PT DURATION: 6 weeks  PLANNED INTERVENTIONS: Therapeutic exercises, Therapeutic activity, Gait training, Patient/Family education, Self Care, Dry Needling, Electrical stimulation, Cryotherapy, Moist heat, Ultrasound, and Manual therapy.  PLAN FOR NEXT SESSION: Combo e'stim/US, STW/M , draw-in progression.  Core exercise progression.  Postural improvement exercises. Draw-in progression,    Newman Pies, PTA 11/16/2022, 12:07 PM

## 2022-11-17 ENCOUNTER — Other Ambulatory Visit: Payer: Self-pay | Admitting: Family Medicine

## 2022-11-19 ENCOUNTER — Ambulatory Visit (INDEPENDENT_AMBULATORY_CARE_PROVIDER_SITE_OTHER): Payer: Medicare Other

## 2022-11-19 VITALS — Ht 64.0 in | Wt 239.0 lb

## 2022-11-19 DIAGNOSIS — Z0000 Encounter for general adult medical examination without abnormal findings: Secondary | ICD-10-CM

## 2022-11-19 NOTE — Progress Notes (Signed)
Subjective:   Dawn Salas is a 82 y.o. female who presents for Medicare Annual (Subsequent) preventive examination.  Visit Complete: Virtual I connected with  Jeannett Senior on 11/19/22 by a audio enabled telemedicine application and verified that I am speaking with the correct person using two identifiers.  Patient Location: Home  Provider Location: Home Office  I discussed the limitations of evaluation and management by telemedicine. The patient expressed understanding and agreed to proceed.  Vital Signs: Because this visit was a virtual/telehealth visit, some criteria may be missing or patient reported. Any vitals not documented were not able to be obtained and vitals that have been documented are patient reported.    Cardiac Risk Factors include: advanced age (>69men, >97 women);diabetes mellitus;hypertension     Objective:    Today's Vitals   11/19/22 1433  Weight: 239 lb (108.4 kg)  Height: 5\' 4"  (1.626 m)   Body mass index is 41.02 kg/m.     11/19/2022    2:40 PM 03/22/2022    5:12 PM 03/19/2022   11:00 AM 03/19/2022   10:41 AM 03/06/2022   11:16 AM 11/13/2021   11:04 AM 04/12/2021   11:17 AM  Advanced Directives  Does Patient Have a Medical Advance Directive? No Yes Yes Yes Yes No No  Type of Chief of Staff of [employer] Power of Attorney    Does patient want to make changes to medical advance directive?   No - Patient declined      Copy of Healthcare Power of Attorney in Chart?   No - copy requested No - copy requested No - copy requested    Would patient like information on creating a medical advance directive? No - Patient declined      No - Patient declined    Current Medications (verified) Outpatient Encounter Medications as of 11/19/2022  Medication Sig   Accu-Chek Softclix Lancets lancets TEST BLOOD SUGAR TWICE DAILY.   acetaminophen (TYLENOL) 650 MG CR tablet Take 1,300 mg by mouth every 8  (eight) hours as needed for pain.   amLODipine (NORVASC) 5 MG tablet TAKE ONE (1) TABLET EACH DAY   ascorbic acid (VITAMIN C) 500 MG tablet Take 500 mg by mouth daily.   cetirizine (ZYRTEC) 10 MG tablet Take 10 mg by mouth daily as needed for allergies.    Flaxseed, Linseed, (FLAX SEED OIL) 1000 MG CAPS Take 1,000 mg by mouth daily.   furosemide (LASIX) 40 MG tablet Take 0.5 tablets (20 mg total) by mouth daily as needed. For fluid   glucose blood (ACCU-CHEK AVIVA PLUS) test strip TEST BLOOD SUGAR TWICE DAILY.   Iron-Vitamins (GERITOL COMPLETE PO) Take 1 tablet by mouth daily.   Lancet Devices (ACCU-CHEK SOFTCLIX) lancets 1 each by Other route 2 (two) times daily. E11.9   levothyroxine (SYNTHROID) 100 MCG tablet Take 1 tablet (100 mcg total) by mouth daily.   losartan-hydrochlorothiazide (HYZAAR) 100-12.5 MG tablet TAKE ONE (1) TABLET EACH DAY   Magnesium 250 MG TABS Take 250 mg by mouth daily.   meclizine (ANTIVERT) 25 MG tablet Take 25 mg by mouth 3 (three) times daily as needed for dizziness.    metoprolol succinate (TOPROL-XL) 50 MG 24 hr tablet TAKE ONE (1) TABLET BY MOUTH EVERY DAY   potassium chloride (KLOR-CON) 10 MEQ tablet TAKE ONE (1) TABLET EACH DAY   Propylene Glycol (SYSTANE BALANCE) 0.6 % SOLN Place 1 drop into both eyes daily as needed (dry  eyes).   RABEprazole (ACIPHEX) 20 MG tablet TAKE ONE (1) TABLET EACH DAY   rosuvastatin (CRESTOR) 10 MG tablet Take 10 mg by mouth daily.   SitaGLIPtin-MetFORMIN HCl (JANUMET XR) 50-500 MG TB24 TAKE ONE (1) TABLET EACH DAY   solifenacin (VESICARE) 5 MG tablet TAKE ONE (1) TABLET BY MOUTH EVERY DAY   valACYclovir (VALTREX) 1000 MG tablet TAKE 2 TABS TWICE DAILY FOR 1 DAY THEN AS NEEDED (Patient taking differently: Take 1,000-2,000 mg by mouth daily as needed (cold sores).)   [DISCONTINUED] venlafaxine (EFFEXOR XR) 37.5 MG 24 hr capsule Take 1 capsule (37.5 mg total) by mouth daily.   No facility-administered encounter medications on file as  of 11/19/2022.    Allergies (verified) Flagyl [metronidazole], Aspirin effervescent, Morphine sulfate, and Penicillins   History: Past Medical History:  Diagnosis Date   Allergy    Anxiety    Arthritis    Asthma    patient denies    Cataract    bilateral lens implants    Colon polyps    Depression    Diabetes mellitus    Type two   Diverticulitis    Double vision 2019   left eye   Family history of adverse reaction to anesthesia    daughter- N/V    Family history of ovarian cancer    Family history of pancreatic cancer    Frequency of urination    GERD (gastroesophageal reflux disease)    History of adverse reaction to anesthesia 03/19/2022   After regional block for TKA pt states she had uncontrollable frequent blinking and "zoned out" was unable to speak more than one or two words for about 10 minutes   History of hiatal hernia    Hyperglycemia    Hyperlipidemia    Hypertension    Hyperthyroidism    PT HAD BIPOSY -NEGATIVE.Marland Kitchen AND NOW JUST GETS CHECKED EACH YEAR .Marland Kitchen 2013 LAST TEST   SEES DR. [name].    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 DOES NOT USE C PAP   Palpitations    Peptic ulcer disease    Pneumonia    "walking pneumonia" hx of   Seizures (HCC) 2015   one siezure with brain surgery none since 2015    Sleep apnea    no CPAP   Urinary tract infection 03/2020   Vertigo    Past Surgical History:  Procedure Laterality Date   ABDOMINAL HYSTERECTOMY     ANTERIOR CERVICAL DECOMP/DISCECTOMY FUSION N/A 04/19/2021   Procedure: Anterior Cervical Discectomy and Fusion with Interbody Prosthesis, Plates/Screws Cervical Three-Four/Cervical Four-Five REMoval CERVICAL PLATE;  Surgeon: Tressie Stalker, MD;  Location: [facility] OR;  Service: Neurosurgery;  Laterality: N/A;  3C   BACK SURGERY     L SPIN 2003   NECK 2004   BRAIN MENINGIOMA EXCISION  08/2008   X 2   C5-C6 neck fusion     COLONOSCOPY  05/05/2009   pt states  multiple polyps removed in both colonoscopies she has had   CRANIOTOMY  08/13/2011   Procedure: CRANIOTOMY TUMOR EXCISION;  Surgeon: Cristi Loron, MD;  Location: [facility];  Service: Neurosurgery;  Laterality: Bilateral;  Bifrontal craniotomy for tumor   DILATION AND CURETTAGE OF UTERUS     YEARS AGO   L4-L5 posterior la  2021   L3-L5 fusion   left foot plantar and hammertoe     right foot bunectomy  right knee arthroscopy     x 3   right shoulder rotator cuff     ROBOTIC ASSISTED BILATERAL SALPINGO OOPHERECTOMY N/A 08/25/2015   Procedure: XI ROBOTIC ASSISTED BILATERAL SALPINGO OOPHORECTOMY;  Surgeon: Laurette Schimke, MD;  Location: [facility] ORS;  Service: Gynecology;  Laterality: N/A;   TONSILLECTOMY     TOTAL KNEE ARTHROPLASTY Right 03/19/2022   Procedure: TOTAL KNEE ARTHROPLASTY;  Surgeon: Ollen Gross, MD;  Location: [facility] ORS;  Service: Orthopedics;  Laterality: Right;   TUBAL LIGATION     WRIST SURGERY Right    Family History  Problem Relation Age of Onset   Ovarian cancer Mother    Heart disease Father 33   Pancreatic cancer Maternal Aunt    Diabetes Maternal Grandmother    Anesthesia problems Daughter        NAUSEA AND VOMITING POST OP   Arthritis Other    Hyperlipidemia Other    Hypertension Other    Diabetes Other    Breast cancer Neg Hx    Colon cancer Neg Hx    Colon polyps Neg Hx    Esophageal cancer Neg Hx    Stomach cancer Neg Hx    Rectal cancer Neg Hx    Social History   Socioeconomic History   Marital status: Married    Spouse name: Not on file   Number of children: 6   Years of education: Not on file   Highest education level: Not on file  Occupational History   Not on file  Tobacco Use   Smoking status: Never   Smokeless tobacco: Never  Vaping Use   Vaping status: Never Used  Substance and Sexual Activity   Alcohol use: No   Drug use: No   Sexual activity: Never  Other Topics Concern   Not on file  Social History Narrative   Not on  file   Social Determinants of Health   Financial Resource Strain: Low Risk  (11/19/2022)   Overall Financial Resource Strain (CARDIA)    Difficulty of Paying Living Expenses: Not hard at all  Food Insecurity: No Food Insecurity (11/19/2022)   Hunger Vital Sign    Worried About Running Out of Food in the Last Year: Never true    Ran Out of Food in the Last Year: Never true  Transportation Needs: No Transportation Needs (11/19/2022)   PRAPARE - Administrator, Civil Service (Medical): No    Lack of Transportation (Non-Medical): No  Physical Activity: Insufficiently Active (11/19/2022)   Exercise Vital Sign    Days of Exercise per Week: 7 days    Minutes of Exercise per Session: 20 min  Stress: No Stress Concern Present (11/19/2022)   [employer] of Occupational Health - Occupational Stress Questionnaire    Feeling of Stress : Not at all  Social Connections: Socially Integrated (11/19/2022)   Social Connection and Isolation Panel [NHANES]    Frequency of Communication with Friends and Family: More than three times a week    Frequency of Social Gatherings with Friends and Family: More than three times a week    Attends Religious Services: More than 4 times per year    Active Member of [employer] or Organizations: Yes    Attends Engineer, structural: More than 4 times per year    Marital Status: Married    Tobacco Counseling Counseling given: Not Answered   Clinical Intake:  Pre-visit preparation completed: Yes  Pain : No/denies pain     BMI -  recorded: 41.02 Nutritional Status: BMI > 30  Obese Nutritional Risks: None Diabetes: Yes CBG done?: No Did pt. bring in CBG monitor from home?: No  How often do you need to have someone help you when you read instructions, pamphlets, or other written materials from your doctor or pharmacy?: 1 - Never  Interpreter Needed?: No  Information entered by :: Theresa Mulligan LPN   Activities of Daily Living     11/19/2022    2:38 PM 03/19/2022   10:41 AM  In your present state of health, do you have any difficulty performing the following activities:  Hearing? 0 0  Vision? 0 0  Difficulty concentrating or making decisions? 0 0  Walking or climbing stairs? 0 0  Dressing or bathing? 0 0  Doing errands, shopping? 0 0  Preparing Food and eating ? N   Using the Toilet? N   In the past six months, have you accidently leaked urine? N   Do you have problems with loss of bowel control? N   Managing your Medications? N   Managing your Finances? N   Housekeeping or managing your Housekeeping? N     Patient Care Team: Kristian Covey, MD as PCP - General (Family Medicine) Wyline Mood Dorothe Pea, MD as PCP - Cardiology (Cardiology)  Indicate any recent Medical Services you may have received from other than [facility] providers in the past year (date may be approximate).     Assessment:   This is a routine wellness examination for Lenya.  Hearing/Vision screen Hearing Screening - Comments:: Denies hearing difficulties   Vision Screening - Comments:: Wears rx glasses - up to date with routine eye exams with  Dr Elmer Picker   Goals Addressed               This Visit's Progress     Increase physical activity (pt-stated)         Depression Screen    11/19/2022    2:37 PM 11/13/2021   11:06 AM 10/06/2021    9:29 AM 01/30/2021    3:41 PM 09/23/2020    2:50 PM 09/23/2020    2:46 PM 02/03/2020   10:42 AM  PHQ 2/9 Scores  PHQ - 2 Score 0 0 2 0 0 0 0  PHQ- 9 Score   8        Fall Risk    11/19/2022    2:39 PM 11/13/2021   11:05 AM 10/06/2021    9:28 AM 01/30/2021    3:41 PM 09/23/2020    2:49 PM  Fall Risk   Falls in the past year? 1 0 0 1 0  Number falls in past yr: 0 0 0 1 0  Injury with Fall? 0 0 0  0  Risk for fall due to : No Fall Risks Medication side effect;Impaired vision No Fall Risks  No Fall Risks  Risk for fall due to: Comment     uses cane  Follow up Falls prevention discussed Falls  prevention discussed;Falls evaluation completed;Education provided Falls evaluation completed  Falls evaluation completed    MEDICARE RISK AT HOME: Medicare Risk at Home Any stairs in or around the home?: Yes If so, are there any without handrails?: No Home free of loose throw rugs in walkways, pet beds, electrical cords, etc?: Yes Adequate lighting in your home to reduce risk of falls?: Yes Life alert?: No Use of a cane, walker or w/c?: No Grab bars in the bathroom?: Yes Shower chair or bench in  shower?: Yes Elevated toilet seat or a handicapped toilet?: Yes  TIMED UP AND GO:  Was the test performed?  No    Cognitive Function:        11/19/2022    2:40 PM 11/13/2021   11:08 AM  6CIT Screen  What Year? 0 points 0 points  What month? 0 points 0 points  What time? 0 points 0 points  Count back from 20 0 points 0 points  Months in reverse 4 points 0 points  Repeat phrase 0 points 0 points  Total Score 4 points 0 points    Immunizations Immunization History  Administered Date(s) Administered   Fluad Quad(high Dose 65+) 09/30/2018, 11/02/2019, 11/01/2020, 10/06/2021   Influenza Split 10/30/2010, 10/17/2011, 10/22/2016   Influenza Whole 12/06/2008, 11/02/2009   Influenza, High Dose Seasonal PF 10/03/2015, 10/22/2017, 11/01/2019   Influenza,inj,Quad PF,6+ Mos 11/10/2012, 11/10/2013   Moderna Sars-Covid-2 Vaccination 02/03/2019, 03/16/2019, 09/14/2019   Pneumococcal Conjugate-13 06/30/2013   Pneumococcal Polysaccharide-23 08/23/2008   Td 10/26/2003   Tdap 06/30/2013    TDAP status: Up to date  Flu Vaccine status: Due, Education has been provided regarding the importance of this vaccine. Advised may receive this vaccine at local pharmacy or Health Dept. Aware to provide a copy of the vaccination record if obtained from local pharmacy or Health Dept. Verbalized acceptance and understanding.  Pneumococcal vaccine status: Up to date  Covid-19 vaccine status: Declined,  Education has been provided regarding the importance of this vaccine but patient still declined. Advised may receive this vaccine at local pharmacy or Health Dept.or vaccine clinic. Aware to provide a copy of the vaccination record if obtained from local pharmacy or Health Dept. Verbalized acceptance and understanding.  Qualifies for Shingles Vaccine? Yes   Zostavax completed No   Shingrix Completed?: No.    Education has been provided regarding the importance of this vaccine. Patient has been advised to call insurance company to determine out of pocket expense if they have not yet received this vaccine. Advised may also receive vaccine at local pharmacy or Health Dept. Verbalized acceptance and understanding.  Screening Tests Health Maintenance  Topic Date Due   Hepatitis C Screening  Never done   Zoster Vaccines- Shingrix (1 of 2) Never done   Diabetic kidney evaluation - Urine ACR  02/02/2022   INFLUENZA VACCINE  08/23/2022   COVID-19 Vaccine (4 - 2023-24 season) 09/23/2022   FOOT EXAM  02/08/2023   OPHTHALMOLOGY EXAM  02/13/2023   HEMOGLOBIN A1C  04/24/2023   Colonoscopy  06/16/2023   DTaP/Tdap/Td (3 - Td or Tdap) 07/01/2023   Diabetic kidney evaluation - eGFR measurement  08/09/2023   Medicare Annual Wellness (AWV)  11/19/2023   Pneumonia Vaccine 73+ Years old  Completed   DEXA SCAN  Completed   HPV VACCINES  Aged Out    Health Maintenance  Health Maintenance Due  Topic Date Due   Hepatitis C Screening  Never done   Zoster Vaccines- Shingrix (1 of 2) Never done   Diabetic kidney evaluation - Urine ACR  02/02/2022   INFLUENZA VACCINE  08/23/2022   COVID-19 Vaccine (4 - 2023-24 season) 09/23/2022        Bone Density status: Completed 10/14/19. Results reflect: Bone density results: OSTEOPOROSIS. Repeat every   years.   Additional Screening:    Vision Screening: Recommended annual ophthalmology exams for early detection of glaucoma and other disorders of the eye. Is  the patient up to date with their annual eye exam?  Yes  Who is the provider or what is the name of the office in which the patient attends annual eye exams? Dr Elmer Picker If pt is not established with a provider, would they like to be referred to a provider to establish care? No .   Dental Screening: Recommended annual dental exams for proper oral hygiene     Community Resource Referral / Chronic Care Management:  CRR required this visit?  No   CCM required this visit?  No     Plan:     I have personally reviewed and noted the following in the patient's chart:   Medical and social history Use of alcohol, tobacco or illicit drugs  Current medications and supplements including opioid prescriptions. Patient is not currently taking opioid prescriptions. Functional ability and status Nutritional status Physical activity Advanced directives List of other physicians Hospitalizations, surgeries, and ER visits in previous 12 months Vitals Screenings to include cognitive, depression, and falls Referrals and appointments  In addition, I have reviewed and discussed with patient certain preventive protocols, quality metrics, and best practice recommendations. A written personalized care plan for preventive services as well as general preventive health recommendations were provided to patient.     Tillie Rung, LPN   [id number]   After Visit Summary: (MyChart) Due to this being a telephonic visit, the after visit summary with patients personalized plan was offered to patient via MyChart   Nurse Notes: None

## 2022-11-19 NOTE — Patient Instructions (Addendum)
Dawn Salas , Thank you for taking time to come for your Medicare Wellness Visit. I appreciate your ongoing commitment to your health goals. Please review the following plan we discussed and let me know if I can assist you in the future.   Referrals/Orders/Follow-Ups/Clinician Recommendations:   This is a list of the screening recommended for you and due dates:  Health Maintenance  Topic Date Due   Hepatitis C Screening  Never done   Zoster (Shingles) Vaccine (1 of 2) Never done   Yearly kidney health urinalysis for diabetes  02/02/2022   Flu Shot  08/23/2022   COVID-19 Vaccine (4 - 2023-24 season) 09/23/2022   Complete foot exam   02/08/2023   Eye exam for diabetics  02/13/2023   Hemoglobin A1C  04/24/2023   Colon Cancer Screening  06/16/2023   DTaP/Tdap/Td vaccine (3 - Td or Tdap) 07/01/2023   Yearly kidney function blood test for diabetes  08/09/2023   Medicare Annual Wellness Visit  11/19/2023   Pneumonia Vaccine  Completed   DEXA scan (bone density measurement)  Completed   HPV Vaccine  Aged Out    Advanced directives: (Declined) Advance directive discussed with you today. Even though you declined this today, please call our office should you change your mind, and we can give you the proper paperwork for you to fill out.  Next Medicare Annual Wellness Visit scheduled for next year: Yes

## 2022-11-22 ENCOUNTER — Ambulatory Visit: Payer: Medicare Other

## 2022-11-22 DIAGNOSIS — M5459 Other low back pain: Secondary | ICD-10-CM

## 2022-11-22 DIAGNOSIS — R293 Abnormal posture: Secondary | ICD-10-CM

## 2022-11-22 DIAGNOSIS — M62830 Muscle spasm of back: Secondary | ICD-10-CM

## 2022-11-22 NOTE — Therapy (Signed)
OUTPATIENT PHYSICAL THERAPY THORACOLUMBAR TREATMENT   Patient Name: Dawn Salas MRN: [id number] DOB:September 27, 1940, 82 y.o., female Today's Date: 11/22/2022  END OF SESSION:  PT End of Session - 11/22/22 1104     Visit Number 6    Number of Visits 12    Date for PT Re-Evaluation 12/03/22    Authorization Type FOTO.    PT Start Time 1100    PT Stop Time 1157    PT Time Calculation (min) 57 min    Activity Tolerance Patient tolerated treatment well    Behavior During Therapy WFL for tasks assessed/performed              Past Medical History:  Diagnosis Date   Allergy    Anxiety    Arthritis    Asthma    patient denies    Cataract    bilateral lens implants    Colon polyps    Depression    Diabetes mellitus    Type two   Diverticulitis    Double vision 2019   left eye   Family history of adverse reaction to anesthesia    daughter- N/V    Family history of ovarian cancer    Family history of pancreatic cancer    Frequency of urination    GERD (gastroesophageal reflux disease)    History of adverse reaction to anesthesia 03/19/2022   After regional block for TKA pt states she had uncontrollable frequent blinking and "zoned out" was unable to speak more than one or two words for about 10 minutes   History of hiatal hernia    Hyperglycemia    Hyperlipidemia    Hypertension    Hyperthyroidism    PT HAD BIPOSY -NEGATIVE.Marland Kitchen AND NOW JUST GETS CHECKED EACH YEAR .Marland Kitchen 2013 LAST TEST   SEES DR. [name].    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 DOES NOT USE C PAP   Palpitations    Peptic ulcer disease    Pneumonia    "walking pneumonia" hx of   Seizures (HCC) 2015   one siezure with brain surgery none since 2015    Sleep apnea    no CPAP   Urinary tract infection 03/2020   Vertigo    Past Surgical History:  Procedure Laterality Date   ABDOMINAL HYSTERECTOMY     ANTERIOR CERVICAL DECOMP/DISCECTOMY FUSION N/A  04/19/2021   Procedure: Anterior Cervical Discectomy and Fusion with Interbody Prosthesis, Plates/Screws Cervical Three-Four/Cervical Four-Five REMoval CERVICAL PLATE;  Surgeon: Tressie Stalker, MD;  Location: [facility] OR;  Service: Neurosurgery;  Laterality: N/A;  3C   BACK SURGERY     L SPIN 2003   NECK 2004   BRAIN MENINGIOMA EXCISION  08/2008   X 2   C5-C6 neck fusion     COLONOSCOPY  05/05/2009   pt states multiple polyps removed in both colonoscopies she has had   CRANIOTOMY  08/13/2011   Procedure: CRANIOTOMY TUMOR EXCISION;  Surgeon: Cristi Loron, MD;  Location: [facility];  Service: Neurosurgery;  Laterality: Bilateral;  Bifrontal craniotomy for tumor   DILATION AND CURETTAGE OF UTERUS     YEARS AGO   L4-L5 posterior la  2021   L3-L5 fusion   left foot plantar and hammertoe     right foot bunectomy     right knee arthroscopy     x 3   right shoulder rotator cuff  ROBOTIC ASSISTED BILATERAL SALPINGO OOPHERECTOMY N/A 08/25/2015   Procedure: XI ROBOTIC ASSISTED BILATERAL SALPINGO OOPHORECTOMY;  Surgeon: Laurette Schimke, MD;  Location: [facility];  Service: Gynecology;  Laterality: N/A;   TONSILLECTOMY     TOTAL KNEE ARTHROPLASTY Right 03/19/2022   Procedure: TOTAL KNEE ARTHROPLASTY;  Surgeon: Ollen Gross, MD;  Location: [facility];  Service: Orthopedics;  Laterality: Right;   TUBAL LIGATION     WRIST SURGERY Right    Patient Active Problem List   Diagnosis Date Noted   OA (osteoarthritis) of knee 03/19/2022   Cervical spondylosis with myelopathy and radiculopathy 04/19/2021   Genetic testing 08/01/2020   Family history of ovarian cancer 07/14/2020   Colon polyps 07/14/2020   Family history of pancreatic cancer 07/14/2020   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   CKD (chronic kidney disease) stage 3, GFR 30-59 ml/min (HCC) 10/01/2017   History of normocytic normochromic anemia 11/25/2015   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   Spondylolisthesis of lumbar region 12/23/2013   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   Hyperthyroidism 05/25/2013   Obesity (BMI 30-39.9) 12/30/2012   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HCC) 08/21/2011   Meningioma (HCC) 08/13/2011   DIZZINESS 11/02/2009   Type 2 diabetes mellitus, controlled (HCC) 05/20/2009   HYPOKALEMIA 05/02/2009   DEPRESSION 06/24/2008   GERD 06/24/2008   PEPTIC ULCER DISEASE 06/24/2008   TMJ SYNDROME 06/09/2008   Hyperlipemia 11/08/2006   OBSTRUCTIVE SLEEP APNEA 11/08/2006   Essential hypertension 11/08/2006   ALLERGIC RHINITIS 11/08/2006   VOCAL CORD DISORDER 11/08/2006   ASTHMA 11/08/2006   TACHYARRHYTHMIA 11/08/2006    REFERRING PROVIDER: Val Eagle  REFERRING DIAG: Spinal stenosis of lumbar region with neurogenic claudication.  Rationale for Evaluation and Treatment: Rehabilitation  THERAPY DIAG:  Other low back pain  Abnormal posture  Muscle spasm of back  ONSET DATE: ~February of 2024 (after a right TKA).  SUBJECTIVE:                                                                                                                                                                                           SUBJECTIVE STATEMENT: Pt reports 5/10 right low back and left leg pain.   PERTINENT HISTORY:  Right total knee replacement, 2 previous lumbar surgeries.  PAIN:  Are you having pain? Yes: NPRS scale: 5/10 Pain location: right low back and left leg Pain description: Sore and numb. Aggravating factors: See above. Relieving factors: Sitting.  PRECAUTIONS: None  RED FLAGS: None   WEIGHT BEARING RESTRICTIONS: No  FALLS:  Has patient fallen in last 6 months? Yes. Number of falls  1.  Flipped out of a Rocker (saw her MD).  LIVING ENVIRONMENT: Lives in: House/apartment Stairs: Uses railing. Has following equipment at home: Single point cane  OCCUPATION: Retired.  PLOF: Independent with  basic ADLs  PATIENT GOALS: Get pain down and do more around the house like she was doing before.  OBJECTIVE:   PATIENT SURVEYS:  FOTO 37.  POSTURE: rounded shoulders, forward head, decreased lumbar lordosis, and flexed trunk .   PALPATION:    Tender and taut to palpation over right lower lumbar musculature and right SIJ and proximal gluteal musculature.  LUMBAR/LE ROM:   The patient stand in 10 degrees of lumbar flexion and she can extend to 5 degrees.  Her active trunk flexion is near normal.  Her right knee is ~5 degrees from being equal to her left knee.  We discussed her continuing to use her Zero Knee to equalize her knee extension.     LOWER EXTREMITY MMT:    She has no bilateral LE strength deficits.  LUMBAR SPECIAL TESTS:  Negative right SLR test.    GAIT: Her gait is antalgic in nature and she walks in a flexed trunk posture with a straight cane.    TODAY'S TREATMENT:                                                                                                                              DATE:  11/22/22:                                    EXERCISE LOG  Exercise Repetitions and Resistance Comments  Nustep Lvl 4 x 20 mins   Rockerboard 4 mins   LAQs 3# x 3 mins   Seated Marches    Seated Hip Abduction    Seated Hip Adduction     Blank cell = exercise not performed today   Modalities  Date:  Unattended Estim: Lumbar, IFC 80-150 Hz, 15 mins, Pain Hot Pack: Lumbar, 15 mins, Pain and Tone   11/05/22                                   EXERCISE LOG  Exercise Repetitions and Resistance Comments  Nustep  Level 4 x 15 minutes.                    Patient in left SDLY position with folded pillow between  knees for comfort:   STW/M x 10 minutes f/b HMP and IFC at 80-150 Hz on 40% scan x 20 minutes. Normal modality response following removal of modality.  PATIENT EDUCATION:  Education details: As below. Person educated: Patient Education method:  Explanation Education comprehension: verbalized understanding.  HOME EXERCISE PROGRAM: Bilateral LE extension with draw-in with bilateral UE support on countertop.  ASSESSMENT:  CLINICAL IMPRESSION:  Pt arrives for today's treatment session reporting 5/10 right low back and left LE pain.  Pt with increased pain during ambulation post Nustep performance.  Pt able to tolerate standing rockerboard today with intermittent use of BUEs required for safety and stability.  Normal responses to estim and MH noted upon removal.  Pt reported 3/10 pain at completion of today's treatment session.   OBJECTIVE IMPAIRMENTS: Abnormal gait, decreased activity tolerance, difficulty walking, decreased ROM, increased muscle spasms, postural dysfunction, and pain.   ACTIVITY LIMITATIONS: carrying, lifting, bending, standing, transfers, and locomotion level  PARTICIPATION LIMITATIONS: meal prep, cleaning, and laundry  PERSONAL FACTORS: Time since onset of injury/illness/exacerbation and 1-2 comorbidities: 2 prior lumbar surgeries and a right total knee replacement  are also affecting patient's functional outcome.   REHAB POTENTIAL: Good  CLINICAL DECISION MAKING: Stable/uncomplicated  EVALUATION COMPLEXITY: Low   GOALS:  SHORT TERM GOALS: Target date: 11/05/22  Ind with a initial HEP. Goal status: MET  LONG TERM GOALS: Target date: 12/03/22  Ind with an advanced HEP.  Goal status: IN PROGRESS  2.  Stand 20 minutes with pain not > 3-4/10.  Goal status: IN PROGRESS  3.  Walk without antalgia.  Goal status: IN PROGRESS  4.  Perform ADL's with pain not > 3-4/10.  Goal status: IN PROGRESS   PLAN:  PT FREQUENCY: 2x/week  PT DURATION: 6 weeks  PLANNED INTERVENTIONS: Therapeutic exercises, Therapeutic activity, Gait training, Patient/Family education, Self Care, Dry Needling, Electrical stimulation, Cryotherapy, Moist heat, Ultrasound, and Manual therapy.  PLAN FOR NEXT SESSION: Combo  e'stim/US, STW/M , draw-in progression.  Core exercise progression.  Postural improvement exercises. Draw-in progression,    Newman Pies, PTA 11/22/2022, 11:59 AM

## 2022-11-30 ENCOUNTER — Ambulatory Visit: Payer: Medicare Other | Attending: Student | Admitting: *Deleted

## 2022-11-30 DIAGNOSIS — M5459 Other low back pain: Secondary | ICD-10-CM | POA: Insufficient documentation

## 2022-11-30 DIAGNOSIS — R293 Abnormal posture: Secondary | ICD-10-CM | POA: Diagnosis present

## 2022-11-30 DIAGNOSIS — M62830 Muscle spasm of back: Secondary | ICD-10-CM | POA: Diagnosis present

## 2022-11-30 NOTE — Addendum Note (Signed)
Addended by: Jacoya Bauman, [place] W on: 11/30/2022 01:06 PM   Modules accepted: Orders

## 2022-11-30 NOTE — Therapy (Signed)
OUTPATIENT PHYSICAL THERAPY THORACOLUMBAR TREATMENT   Patient Name: Dawn Salas MRN: [id number] DOB:[birth year], 82 y.o., female Today's Date: 11/30/2022  END OF SESSION:  PT End of Session - 11/30/22 1057     Visit Number 7    Number of Visits 12    Date for PT Re-Evaluation 12/03/22    Authorization Type FOTO.    PT Start Time 1058    PT Stop Time 1150    PT Time Calculation (min) 52 min              Past Medical History:  Diagnosis Date   Allergy    Anxiety    Arthritis    Asthma    patient denies    Cataract    bilateral lens implants    Colon polyps    Depression    Diabetes mellitus    Type two   Diverticulitis    Double vision 2019   left eye   Family history of adverse reaction to anesthesia    daughter- N/V    Family history of ovarian cancer    Family history of pancreatic cancer    Frequency of urination    GERD (gastroesophageal reflux disease)    History of adverse reaction to anesthesia 03/19/2022   After regional block for TKA pt states she had uncontrollable frequent blinking and "zoned out" was unable to speak more than one or two words for about 10 minutes   History of hiatal hernia    Hyperglycemia    Hyperlipidemia    Hypertension    Hyperthyroidism    PT HAD BIPOSY -NEGATIVE.Marland Kitchen AND NOW JUST GETS CHECKED EACH YEAR .Marland Kitchen 2013 LAST TEST   SEES DR. [name].    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 DOES NOT USE C PAP   Palpitations    Peptic ulcer disease    Pneumonia    "walking pneumonia" hx of   Seizures (HCC) 2015   one siezure with brain surgery none since 2015    Sleep apnea    no CPAP   Urinary tract infection 03/2020   Vertigo    Past Surgical History:  Procedure Laterality Date   ABDOMINAL HYSTERECTOMY     ANTERIOR CERVICAL DECOMP/DISCECTOMY FUSION N/A 04/19/2021   Procedure: Anterior Cervical Discectomy and Fusion with Interbody Prosthesis, Plates/Screws Cervical  Three-Four/Cervical Four-Five REMoval CERVICAL PLATE;  Surgeon: Tressie Stalker, MD;  Location: [facility] OR;  Service: Neurosurgery;  Laterality: N/A;  3C   BACK SURGERY     L SPIN 2003   NECK 2004   BRAIN MENINGIOMA EXCISION  08/2008   X 2   C5-C6 neck fusion     COLONOSCOPY  05/05/2009   pt states multiple polyps removed in both colonoscopies she has had   CRANIOTOMY  08/13/2011   Procedure: CRANIOTOMY TUMOR EXCISION;  Surgeon: Cristi Loron, MD;  Location: [facility];  Service: Neurosurgery;  Laterality: Bilateral;  Bifrontal craniotomy for tumor   DILATION AND CURETTAGE OF UTERUS     YEARS AGO   L4-L5 posterior la  2021   L3-L5 fusion   left foot plantar and hammertoe     right foot bunectomy     right knee arthroscopy     x 3   right shoulder rotator cuff     ROBOTIC ASSISTED BILATERAL SALPINGO OOPHERECTOMY N/A 08/25/2015   Procedure: XI ROBOTIC ASSISTED BILATERAL SALPINGO OOPHORECTOMY;  Surgeon: Toniann Fail  Nelly Rout, MD;  Location: [facility];  Service: Gynecology;  Laterality: N/A;   TONSILLECTOMY     TOTAL KNEE ARTHROPLASTY Right 03/19/2022   Procedure: TOTAL KNEE ARTHROPLASTY;  Surgeon: Ollen Gross, MD;  Location: [facility];  Service: Orthopedics;  Laterality: Right;   TUBAL LIGATION     WRIST SURGERY Right    Patient Active Problem List   Diagnosis Date Noted   OA (osteoarthritis) of knee 03/19/2022   Cervical spondylosis with myelopathy and radiculopathy 04/19/2021   Genetic testing 08/01/2020   Family history of ovarian cancer 07/14/2020   Colon polyps 07/14/2020   Family history of pancreatic cancer 07/14/2020   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   CKD (chronic kidney disease) stage 3, GFR 30-59 ml/min (HCC) 10/01/2017   History of normocytic normochromic anemia 11/25/2015   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   Spondylolisthesis of lumbar region 12/23/2013   Idiopathic guttate  hypomelanosis 06/30/2013   Hyperthyroidism 05/25/2013   Obesity (BMI 30-39.9) 12/30/2012   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HCC) 08/21/2011   Meningioma (HCC) 08/13/2011   DIZZINESS 11/02/2009   Type 2 diabetes mellitus, controlled (HCC) 05/20/2009   HYPOKALEMIA 05/02/2009   DEPRESSION 06/24/2008   GERD 06/24/2008   PEPTIC ULCER DISEASE 06/24/2008   TMJ SYNDROME 06/09/2008   Hyperlipemia 11/08/2006   OBSTRUCTIVE SLEEP APNEA 11/08/2006   Essential hypertension 11/08/2006   ALLERGIC RHINITIS 11/08/2006   VOCAL CORD DISORDER 11/08/2006   ASTHMA 11/08/2006   TACHYARRHYTHMIA 11/08/2006    REFERRING PROVIDER: Val Eagle  REFERRING DIAG: Spinal stenosis of lumbar region with neurogenic claudication.  Rationale for Evaluation and Treatment: Rehabilitation  THERAPY DIAG:  Other low back pain  Abnormal posture  Muscle spasm of back  ONSET DATE: ~February 26th of 2024 (after a right TKA).  SUBJECTIVE:                                                                                                                                                                                           SUBJECTIVE STATEMENT: Pt reports 5-6/10 right low back and left leg pain still. PT has helped  PERTINENT HISTORY:  Right total knee replacement, 2 previous lumbar surgeries.  PAIN:  Are you having pain? Yes: NPRS scale: 5/10 Pain location: right low back and left leg Pain description: Sore and numb. Aggravating factors: See above. Relieving factors: Sitting.  PRECAUTIONS: None  RED FLAGS: None   WEIGHT BEARING RESTRICTIONS: No  FALLS:  Has patient fallen in last 6 months? Yes. Number of falls 1.  Flipped out of a Rocker (saw her MD).  LIVING ENVIRONMENT: Lives in:  House/apartment Stairs: Uses railing. Has following equipment at home: Single point cane  OCCUPATION: Retired.  PLOF: Independent with basic ADLs  PATIENT GOALS: Get pain down and do more around the house like she was  doing before.  OBJECTIVE:   PATIENT SURVEYS:  FOTO 37.  POSTURE: rounded shoulders, forward head, decreased lumbar lordosis, and flexed trunk .   PALPATION:    Tender and taut to palpation over right lower lumbar musculature and right SIJ and proximal gluteal musculature.  LUMBAR/LE ROM:   The patient stand in 10 degrees of lumbar flexion and she can extend to 5 degrees.  Her active trunk flexion is near normal.  Her right knee is ~5 degrees from being equal to her left knee.  We discussed her continuing to use her Zero Knee to equalize her knee extension.     LOWER EXTREMITY MMT:    She has no bilateral LE strength deficits.  LUMBAR SPECIAL TESTS:  Negative right SLR test.    GAIT: Her gait is antalgic in nature and she walks in a flexed trunk posture with a straight cane.    TODAY'S TREATMENT:                                                                                                                              DATE:                                                                            11/30/22:                                      EXERCISE LOG  RT side   LB  Exercise Repetitions and Resistance Comments  Nustep Lvl 4 x 15 mins   Rockerboard 4 mins   LAQs 3# 3x10 BIL   Seated Marches 3# 3x10   BIL   Seated Hip Abduction    Seated Hip Adduction     Blank cell = exercise not performed today   Modalities  Date:  Unattended Estim: Lumbar, IFC 80-150 Hz, 15 mins, Pain Hot Pack: Lumbar, 15 mins, Pain and Tone   11/05/22                                   EXERCISE LOG  Exercise Repetitions and Resistance Comments  Nustep  Level 4 x 15 minutes.                    Patient in left SDLY position with folded pillow  between  knees for comfort:   STW/M x 10 minutes f/b HMP and IFC at 80-150 Hz on 40% scan x 20 minutes. Normal modality response following removal of modality.  PATIENT EDUCATION:  Education details: As below. Person educated:  Patient Education method: Explanation Education comprehension: verbalized understanding.  HOME EXERCISE PROGRAM: Bilateral LE extension with draw-in with bilateral UE support on countertop.  ASSESSMENT:  CLINICAL IMPRESSION: Pt arrives for today's treatment session reporting 5/10 right low back and left LE pain. Pt reports at least 30% better since stating PT. And usually feel good after Rx's. She reports increased pain if over doing it with ADL's. Rx focused on therex for core and LE strengthening.LTG #2 met today       OBJECTIVE IMPAIRMENTS: Abnormal gait, decreased activity tolerance, difficulty walking, decreased ROM, increased muscle spasms, postural dysfunction, and pain.   ACTIVITY LIMITATIONS: carrying, lifting, bending, standing, transfers, and locomotion level  PARTICIPATION LIMITATIONS: meal prep, cleaning, and laundry  PERSONAL FACTORS: Time since onset of injury/illness/exacerbation and 1-2 comorbidities: 2 prior lumbar surgeries and a right total knee replacement  are also affecting patient's functional outcome.   REHAB POTENTIAL: Good  CLINICAL DECISION MAKING: Stable/uncomplicated  EVALUATION COMPLEXITY: Low   GOALS:  SHORT TERM GOALS: Target date: 11/05/22  Ind with a initial HEP. Goal status: MET  LONG TERM GOALS: Target date: 12/03/22  Ind with an advanced HEP.  Goal status: IN PROGRESS  2.  Stand 20 minutes with pain not > 3-4/10.  Goal status: MET  3.  Walk without antalgia.  Goal status: IN PROGRESS  4.  Perform ADL's with pain not > 3-4/10.  Goal status: IN PROGRESS   PLAN:  PT FREQUENCY: 2x/week  PT DURATION: 6 weeks  PLANNED INTERVENTIONS: Therapeutic exercises, Therapeutic activity, Gait training, Patient/Family education, Self Care, Dry Needling, Electrical stimulation, Cryotherapy, Moist heat, Ultrasound, and Manual therapy.  PLAN FOR NEXT SESSION: Combo e'stim/US, STW/M , draw-in progression.  Core exercise progression.   Postural improvement exercises. Draw-in progression,    Effie Janoski,CHRIS, PTA 11/30/2022, 12:51 PM

## 2022-12-04 ENCOUNTER — Encounter: Payer: Medicare Other | Admitting: *Deleted

## 2022-12-04 ENCOUNTER — Encounter: Payer: Self-pay | Admitting: Family Medicine

## 2022-12-04 ENCOUNTER — Ambulatory Visit (INDEPENDENT_AMBULATORY_CARE_PROVIDER_SITE_OTHER): Payer: Medicare Other | Admitting: Family Medicine

## 2022-12-04 VITALS — BP 132/68 | HR 82 | Temp 98.2°F | Ht 64.0 in | Wt 239.2 lb

## 2022-12-04 DIAGNOSIS — M7582 Other shoulder lesions, left shoulder: Secondary | ICD-10-CM | POA: Diagnosis not present

## 2022-12-04 DIAGNOSIS — Z23 Encounter for immunization: Secondary | ICD-10-CM

## 2022-12-04 MED ORDER — METHYLPREDNISOLONE ACETATE 80 MG/ML IJ SUSP
80.0000 mg | Freq: Once | INTRAMUSCULAR | Status: AC
Start: 2022-12-04 — End: 2022-12-04
  Administered 2022-12-04: 80 mg via INTRA_ARTICULAR

## 2022-12-04 NOTE — Addendum Note (Signed)
Addended by: Christy Sartorius on: 12/04/2022 03:13 PM   Modules accepted: Orders

## 2022-12-04 NOTE — Progress Notes (Signed)
Established Patient Office Visit  Subjective   Patient ID: Dawn Salas, female    DOB: 1940/04/28  Age: 82 y.o. MRN: [id number]  Chief Complaint  Patient presents with   Shoulder Pain    HPI   Odyssey is seen with at least 75-month history of progressive left shoulder pain.  Denies any specific injury.  Pain is worse with internal rotation and abduction.  Starting to limit range of motion significantly.  No radiculitis symptoms.  No weakness.  Does not recall any falls.  Has taken some Tylenol without much relief.  Starting to have more night pain.  She has had some knee difficulties and has to push-up significantly with her upper extremities and thinks that may be exacerbating.  This is starting to interfere with day-to-day activities such as getting dressed.  Past Medical History:  Diagnosis Date   Allergy    Anxiety    Arthritis    Asthma    patient denies    Cataract    bilateral lens implants    Colon polyps    Depression    Diabetes mellitus    Type two   Diverticulitis    Double vision 2019   left eye   Family history of adverse reaction to anesthesia    daughter- N/V    Family history of ovarian cancer    Family history of pancreatic cancer    Frequency of urination    GERD (gastroesophageal reflux disease)    History of adverse reaction to anesthesia 03/19/2022   After regional block for TKA pt states she had uncontrollable frequent blinking and "zoned out" was unable to speak more than one or two words for about 10 minutes   History of hiatal hernia    Hyperglycemia    Hyperlipidemia    Hypertension    Hyperthyroidism    PT HAD BIPOSY -NEGATIVE.Marland Kitchen AND NOW JUST GETS CHECKED EACH YEAR .Marland Kitchen 2013 LAST TEST   SEES DR. [name].    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 DOES NOT USE C PAP   Palpitations    Peptic ulcer disease    Pneumonia    "walking pneumonia" hx of   Seizures (HCC) 2015   one siezure with  brain surgery none since 2015    Sleep apnea    no CPAP   Urinary tract infection 03/2020   Vertigo    Past Surgical History:  Procedure Laterality Date   ABDOMINAL HYSTERECTOMY     ANTERIOR CERVICAL DECOMP/DISCECTOMY FUSION N/A 04/19/2021   Procedure: Anterior Cervical Discectomy and Fusion with Interbody Prosthesis, Plates/Screws Cervical Three-Four/Cervical Four-Five REMoval CERVICAL PLATE;  Surgeon: Tressie Stalker, MD;  Location: [facility];  Service: Neurosurgery;  Laterality: N/A;  3C   BACK SURGERY     L SPIN 2003   NECK 2004   BRAIN MENINGIOMA EXCISION  08/2008   X 2   C5-C6 neck fusion     COLONOSCOPY  05/05/2009   pt states multiple polyps removed in both colonoscopies she has had   CRANIOTOMY  08/13/2011   Procedure: CRANIOTOMY TUMOR EXCISION;  Surgeon: Cristi Loron, MD;  Location: [facility];  Service: Neurosurgery;  Laterality: Bilateral;  Bifrontal craniotomy for tumor   DILATION AND CURETTAGE OF UTERUS     YEARS AGO   L4-L5 posterior la  2021   L3-L5 fusion   left foot plantar and hammertoe  right foot bunectomy     right knee arthroscopy     x 3   right shoulder rotator cuff     ROBOTIC ASSISTED BILATERAL SALPINGO OOPHERECTOMY N/A 08/25/2015   Procedure: XI ROBOTIC ASSISTED BILATERAL SALPINGO OOPHORECTOMY;  Surgeon: Laurette Schimke, MD;  Location: [facility];  Service: Gynecology;  Laterality: N/A;   TONSILLECTOMY     TOTAL KNEE ARTHROPLASTY Right 03/19/2022   Procedure: TOTAL KNEE ARTHROPLASTY;  Surgeon: Ollen Gross, MD;  Location: [facility];  Service: Orthopedics;  Laterality: Right;   TUBAL LIGATION     WRIST SURGERY Right     reports that she has never smoked. She has never used smokeless tobacco. She reports that she does not drink alcohol and does not use drugs. family history includes Anesthesia problems in her daughter; Arthritis in an other family member; Diabetes in her maternal grandmother and another family member; Heart disease (age of onset: 50) in  her father; Hyperlipidemia in an other family member; Hypertension in an other family member; Ovarian cancer in her mother; Pancreatic cancer in her maternal aunt. Allergies  Allergen Reactions   Flagyl [Metronidazole] Other (See Comments)    Caused increased heart rate   Aspirin Effervescent Swelling    Alka-Seltzer gel caps- sore mouth, face swollen, turned hands white   Morphine Sulfate Hives    Hallucinations   Penicillins Swelling and Rash    Previously tolerated cephalexin    Review of Systems  Constitutional:  Negative for chills and fever.  Cardiovascular:  Negative for chest pain.  Neurological:  Negative for focal weakness.      Objective:     BP 132/68 (BP Location: Right Arm, Patient Position: Sitting, Cuff Size: Large)   Pulse 82   Temp 98.2 F (36.8 C) (Oral)   Ht 5\' 4"  (1.626 m)   Wt 239 lb 3.2 oz (108.5 kg)   SpO2 98%   BMI 41.06 kg/m  BP Readings from Last 3 Encounters:  12/04/22 132/68  11/12/22 130/80  10/24/22 (!) 120/90   Wt Readings from Last 3 Encounters:  12/04/22 239 lb 3.2 oz (108.5 kg)  11/19/22 239 lb (108.4 kg)  11/12/22 239 lb 3.2 oz (108.5 kg)      Physical Exam Vitals reviewed.  Constitutional:      General: She is not in acute distress.    Appearance: Normal appearance.  Cardiovascular:     Rate and Rhythm: Normal rate and regular rhythm.  Pulmonary:     Effort: Pulmonary effort is normal.     Breath sounds: Normal breath sounds. No wheezing or rales.  Musculoskeletal:     Comments: Shoulder reveals no acromioclavicular tenderness.  She has some mild left distal biceps tenderness.  Mild subacromial tenderness posteriorly.  She has pain with abduction greater than 80 degrees against resistance and also internal rotation.  Does have some limitation of range of motion with internal rotation and abduction compared to the right side.  Neurological:     Mental Status: She is alert.      No results found for any visits on  12/04/22.    The ASCVD Risk score (Arnett DK, et al., 2019) failed to calculate for the following reasons:   The 2019 ASCVD risk score is only valid for ages 40 to 32    Assessment & Plan:   Problem List Items Addressed This Visit   None Visit Diagnoses     Rotator cuff tendinitis, left    -  Primary   Relevant  Orders   Ambulatory referral to Physical Therapy     32-month history of left progressive shoulder pain.  Suspect rotator cuff tendinitis.  At risk for developing adhesive capsulitis   Discussed risks and benefits of corticosteroid injection and patient consented.  After prepping skin with betadine, injected 80 mg depomedrol and 2 cc of plain xylocaine with 25 gauge one and one half inch needle using posterior lateral approach and pt tolerated well. -Recommend gentle range of motion as tolerated -Set up physical therapy for left shoulder and if not continuing to improve over the next several weeks be in touch   No follow-ups on file.    Evelena Peat, MD

## 2022-12-10 ENCOUNTER — Other Ambulatory Visit: Payer: Self-pay | Admitting: Otolaryngology

## 2022-12-10 DIAGNOSIS — L989 Disorder of the skin and subcutaneous tissue, unspecified: Secondary | ICD-10-CM

## 2022-12-12 ENCOUNTER — Other Ambulatory Visit: Payer: Self-pay | Admitting: Family Medicine

## 2022-12-13 ENCOUNTER — Ambulatory Visit
Admission: RE | Admit: 2022-12-13 | Discharge: 2022-12-13 | Disposition: A | Discharge status: Home / Self Care | Payer: Medicare Other | Source: Ambulatory Visit | Attending: Otolaryngology | Admitting: Otolaryngology

## 2022-12-13 DIAGNOSIS — L989 Disorder of the skin and subcutaneous tissue, unspecified: Secondary | ICD-10-CM

## 2022-12-14 ENCOUNTER — Ambulatory Visit: Payer: Medicare Other

## 2022-12-14 DIAGNOSIS — M5459 Other low back pain: Secondary | ICD-10-CM | POA: Diagnosis not present

## 2022-12-14 DIAGNOSIS — R293 Abnormal posture: Secondary | ICD-10-CM

## 2022-12-14 DIAGNOSIS — M62830 Muscle spasm of back: Secondary | ICD-10-CM

## 2022-12-14 NOTE — Therapy (Signed)
OUTPATIENT PHYSICAL THERAPY THORACOLUMBAR TREATMENT   Patient Name: Dawn Salas MRN: [id number] DOB:01/07/1941, 82 y.o., female Today's Date: 12/14/2022  END OF SESSION:  PT End of Session - 12/14/22 1103     Visit Number 8    Number of Visits 12    Date for PT Re-Evaluation 12/24/22    Authorization Type FOTO.    PT Start Time 1100    PT Stop Time 1158    PT Time Calculation (min) 58 min              Past Medical History:  Diagnosis Date   Allergy    Anxiety    Arthritis    Asthma    patient denies    Cataract    bilateral lens implants    Colon polyps    Depression    Diabetes mellitus    Type two   Diverticulitis    Double vision 2019   left eye   Family history of adverse reaction to anesthesia    daughter- N/V    Family history of ovarian cancer    Family history of pancreatic cancer    Frequency of urination    GERD (gastroesophageal reflux disease)    History of adverse reaction to anesthesia 03/19/2022   After regional block for TKA pt states she had uncontrollable frequent blinking and "zoned out" was unable to speak more than one or two words for about 10 minutes   History of hiatal hernia    Hyperglycemia    Hyperlipidemia    Hypertension    Hyperthyroidism    PT HAD BIPOSY -NEGATIVE.Marland Kitchen AND NOW JUST GETS CHECKED EACH YEAR .Marland Kitchen 2013 LAST TEST   SEES DR. [name].    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 DOES NOT USE C PAP   Palpitations    Peptic ulcer disease    Pneumonia    "walking pneumonia" hx of   Seizures (HCC) 2015   one siezure with brain surgery none since 2015    Sleep apnea    no CPAP   Urinary tract infection 03/2020   Vertigo    Past Surgical History:  Procedure Laterality Date   ABDOMINAL HYSTERECTOMY     ANTERIOR CERVICAL DECOMP/DISCECTOMY FUSION N/A 04/19/2021   Procedure: Anterior Cervical Discectomy and Fusion with Interbody Prosthesis, Plates/Screws Cervical  Three-Four/Cervical Four-Five REMoval CERVICAL PLATE;  Surgeon: Tressie Stalker, MD;  Location: [facility] OR;  Service: Neurosurgery;  Laterality: N/A;  3C   BACK SURGERY     L SPIN 2003   NECK 2004   BRAIN MENINGIOMA EXCISION  08/2008   X 2   C5-C6 neck fusion     COLONOSCOPY  05/05/2009   pt states multiple polyps removed in both colonoscopies she has had   CRANIOTOMY  08/13/2011   Procedure: CRANIOTOMY TUMOR EXCISION;  Surgeon: Cristi Loron, MD;  Location: [facility];  Service: Neurosurgery;  Laterality: Bilateral;  Bifrontal craniotomy for tumor   DILATION AND CURETTAGE OF UTERUS     YEARS AGO   L4-L5 posterior la  2021   L3-L5 fusion   left foot plantar and hammertoe     right foot bunectomy     right knee arthroscopy     x 3   right shoulder rotator cuff     ROBOTIC ASSISTED BILATERAL SALPINGO OOPHERECTOMY N/A 08/25/2015   Procedure: XI ROBOTIC ASSISTED BILATERAL SALPINGO OOPHORECTOMY;  Surgeon: Toniann Fail  Nelly Rout, MD;  Location: [facility];  Service: Gynecology;  Laterality: N/A;   TONSILLECTOMY     TOTAL KNEE ARTHROPLASTY Right 03/19/2022   Procedure: TOTAL KNEE ARTHROPLASTY;  Surgeon: Ollen Gross, MD;  Location: [facility];  Service: Orthopedics;  Laterality: Right;   TUBAL LIGATION     WRIST SURGERY Right    Patient Active Problem List   Diagnosis Date Noted   OA (osteoarthritis) of knee 03/19/2022   Cervical spondylosis with myelopathy and radiculopathy 04/19/2021   Genetic testing 08/01/2020   Family history of ovarian cancer 07/14/2020   Colon polyps 07/14/2020   Family history of pancreatic cancer 07/14/2020   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   CKD (chronic kidney disease) stage 3, GFR 30-59 ml/min (HCC) 10/01/2017   History of normocytic normochromic anemia 11/25/2015   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   Spondylolisthesis of lumbar region 12/23/2013   Idiopathic guttate  hypomelanosis 06/30/2013   Hyperthyroidism 05/25/2013   Obesity (BMI 30-39.9) 12/30/2012   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HCC) 08/21/2011   Meningioma (HCC) 08/13/2011   DIZZINESS 11/02/2009   Type 2 diabetes mellitus, controlled (HCC) 05/20/2009   HYPOKALEMIA 05/02/2009   DEPRESSION 06/24/2008   GERD 06/24/2008   PEPTIC ULCER DISEASE 06/24/2008   TMJ SYNDROME 06/09/2008   Hyperlipemia 11/08/2006   OBSTRUCTIVE SLEEP APNEA 11/08/2006   Essential hypertension 11/08/2006   ALLERGIC RHINITIS 11/08/2006   VOCAL CORD DISORDER 11/08/2006   ASTHMA 11/08/2006   TACHYARRHYTHMIA 11/08/2006    REFERRING PROVIDER: Val Eagle  REFERRING DIAG: Spinal stenosis of lumbar region with neurogenic claudication.  Rationale for Evaluation and Treatment: Rehabilitation  THERAPY DIAG:  Other low back pain  Abnormal posture  Muscle spasm of back  ONSET DATE: ~February 26th of 2024 (after a right TKA).  SUBJECTIVE:                                                                                                                                                                                           SUBJECTIVE STATEMENT: Pt reports 4/10 right low back pain today.   PERTINENT HISTORY:  Right total knee replacement, 2 previous lumbar surgeries.  PAIN:  Are you having pain? Yes: NPRS scale: 4/10 Pain location: right low back Pain description: Sore and numb. Aggravating factors: See above. Relieving factors: Sitting.  PRECAUTIONS: None  RED FLAGS: None   WEIGHT BEARING RESTRICTIONS: No  FALLS:  Has patient fallen in last 6 months? Yes. Number of falls 1.  Flipped out of a Rocker (saw her MD).  LIVING ENVIRONMENT: Lives in: House/apartment Stairs: Uses railing. Has following equipment at  home: Single point cane  OCCUPATION: Retired.  PLOF: Independent with basic ADLs  PATIENT GOALS: Get pain down and do more around the house like she was doing before.  OBJECTIVE:   PATIENT  SURVEYS:  FOTO 37.  POSTURE: rounded shoulders, forward head, decreased lumbar lordosis, and flexed trunk .   PALPATION:    Tender and taut to palpation over right lower lumbar musculature and right SIJ and proximal gluteal musculature.  LUMBAR/LE ROM:   The patient stand in 10 degrees of lumbar flexion and she can extend to 5 degrees.  Her active trunk flexion is near normal.  Her right knee is ~5 degrees from being equal to her left knee.  We discussed her continuing to use her Zero Knee to equalize her knee extension.     LOWER EXTREMITY MMT:    She has no bilateral LE strength deficits.  LUMBAR SPECIAL TESTS:  Negative right SLR test.    GAIT: Her gait is antalgic in nature and she walks in a flexed trunk posture with a straight cane.    TODAY'S TREATMENT:                                                                                                                              DATE:  12/14/22:                                    EXERCISE LOG  RT side   LB  Exercise Repetitions and Resistance Comments  Nustep Lvl 4 x 15 mins   Rockerboard    LAQs    Seated Marches    Seated Hip Abduction    Seated Hip Adduction     Blank cell = exercise not performed today   Manual Therapy Soft Tissue Mobilization: Lumbar, STW/M to right lumbar paraspinals and QL to decrease pain and tone with pt in left side-lying for comfort     Modalities  Date:  Unattended Estim: Lumbar, IFC 80-150 Hz, 15 mins, Pain Combo: Lumbar, 1.5 w/cm2, 15 mins, Pain Hot Pack: Lumbar, 15 mins, Pain and Tone   11/05/22                                   EXERCISE LOG  Exercise Repetitions and Resistance Comments  Nustep  Level 4 x 15 minutes.        Patient in left SDLY position with folded pillow between  knees for comfort:   STW/M x 10 minutes f/b HMP and IFC at 80-150 Hz on 40% scan x 20 minutes. Normal modality response following removal of modality.  PATIENT EDUCATION:  Education details: As  below. Person educated: Patient Education method: Explanation Education comprehension: verbalized understanding.  HOME EXERCISE PROGRAM: Bilateral LE extension with draw-in with bilateral UE support on countertop.  ASSESSMENT:  CLINICAL IMPRESSION: Pt arrives for today's treatment session reporting 4/10 right low back pain.  Normal responses to all modalities performed today.  Pt reports performing exercises at home with good results.  Pt reported 2/10 right low back pain at completion of today's treatment session.  OBJECTIVE IMPAIRMENTS: Abnormal gait, decreased activity tolerance, difficulty walking, decreased ROM, increased muscle spasms, postural dysfunction, and pain.   ACTIVITY LIMITATIONS: carrying, lifting, bending, standing, transfers, and locomotion level  PARTICIPATION LIMITATIONS: meal prep, cleaning, and laundry  PERSONAL FACTORS: Time since onset of injury/illness/exacerbation and 1-2 comorbidities: 2 prior lumbar surgeries and a right total knee replacement  are also affecting patient's functional outcome.   REHAB POTENTIAL: Good  CLINICAL DECISION MAKING: Stable/uncomplicated  EVALUATION COMPLEXITY: Low   GOALS:  SHORT TERM GOALS: Target date: 11/05/22  Ind with a initial HEP. Goal status: MET  LONG TERM GOALS: Target date: 12/03/22  Ind with an advanced HEP.  Goal status: IN PROGRESS  2.  Stand 20 minutes with pain not > 3-4/10.  Goal status: MET  3.  Walk without antalgia.  Goal status: IN PROGRESS  4.  Perform ADL's with pain not > 3-4/10.  Goal status: IN PROGRESS   PLAN:  PT FREQUENCY: 2x/week  PT DURATION: 6 weeks  PLANNED INTERVENTIONS: Therapeutic exercises, Therapeutic activity, Gait training, Patient/Family education, Self Care, Dry Needling, Electrical stimulation, Cryotherapy, Moist heat, Ultrasound, and Manual therapy.  PLAN FOR NEXT SESSION: Combo e'stim/US, STW/M , draw-in progression.  Core exercise progression.  Postural  improvement exercises. Draw-in progression,    Newman Pies, PTA 12/14/2022, 12:06 PM

## 2022-12-17 ENCOUNTER — Other Ambulatory Visit: Payer: Self-pay | Admitting: Neurosurgery

## 2022-12-17 ENCOUNTER — Ambulatory Visit: Payer: Medicare Other | Admitting: Physical Therapy

## 2022-12-17 DIAGNOSIS — M62830 Muscle spasm of back: Secondary | ICD-10-CM

## 2022-12-17 DIAGNOSIS — M5459 Other low back pain: Secondary | ICD-10-CM

## 2022-12-17 DIAGNOSIS — R293 Abnormal posture: Secondary | ICD-10-CM

## 2022-12-17 NOTE — Therapy (Signed)
OUTPATIENT PHYSICAL THERAPY THORACOLUMBAR TREATMENT   Patient Name: Dawn Salas MRN: [id number] DOB:1940/10/09, 82 y.o., female Today's Date: 12/17/2022  END OF SESSION:  PT End of Session - 12/17/22 1154     Visit Number 9    Number of Visits 12    Date for PT Re-Evaluation 12/24/22    Authorization Type FOTO.    PT Start Time 1100    Activity Tolerance Patient tolerated treatment well    Behavior During Therapy [facility] for tasks assessed/performed               Past Medical History:  Diagnosis Date   Allergy    Anxiety    Arthritis    Asthma    patient denies    Cataract    bilateral lens implants    Colon polyps    Depression    Diabetes mellitus    Type two   Diverticulitis    Double vision 2019   left eye   Family history of adverse reaction to anesthesia    daughter- N/V    Family history of ovarian cancer    Family history of pancreatic cancer    Frequency of urination    GERD (gastroesophageal reflux disease)    History of adverse reaction to anesthesia 03/19/2022   After regional block for TKA pt states she had uncontrollable frequent blinking and "zoned out" was unable to speak more than one or two words for about 10 minutes   History of hiatal hernia    Hyperglycemia    Hyperlipidemia    Hypertension    Hyperthyroidism    PT HAD BIPOSY -NEGATIVE.Marland Kitchen AND NOW JUST GETS CHECKED EACH YEAR .Marland Kitchen 2013 LAST TEST   SEES DR. [name].    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 DOES NOT USE C PAP   Palpitations    Peptic ulcer disease    Pneumonia    "walking pneumonia" hx of   Seizures (HCC) 2015   one siezure with brain surgery none since 2015    Sleep apnea    no CPAP   Urinary tract infection 03/2020   Vertigo    Past Surgical History:  Procedure Laterality Date   ABDOMINAL HYSTERECTOMY     ANTERIOR CERVICAL DECOMP/DISCECTOMY FUSION N/A 04/19/2021   Procedure: Anterior Cervical Discectomy and  Fusion with Interbody Prosthesis, Plates/Screws Cervical Three-Four/Cervical Four-Five REMoval CERVICAL PLATE;  Surgeon: Tressie Stalker, MD;  Location: [facility] OR;  Service: Neurosurgery;  Laterality: N/A;  3C   BACK SURGERY     L SPIN 2003   NECK 2004   BRAIN MENINGIOMA EXCISION  08/2008   X 2   C5-C6 neck fusion     COLONOSCOPY  05/05/2009   pt states multiple polyps removed in both colonoscopies she has had   CRANIOTOMY  08/13/2011   Procedure: CRANIOTOMY TUMOR EXCISION;  Surgeon: Cristi Loron, MD;  Location: [facility];  Service: Neurosurgery;  Laterality: Bilateral;  Bifrontal craniotomy for tumor   DILATION AND CURETTAGE OF UTERUS     YEARS AGO   L4-L5 posterior la  2021   L3-L5 fusion   left foot plantar and hammertoe     right foot bunectomy     right knee arthroscopy     x 3   right shoulder rotator cuff     ROBOTIC ASSISTED BILATERAL SALPINGO OOPHERECTOMY N/A 08/25/2015   Procedure: XI ROBOTIC ASSISTED BILATERAL SALPINGO  OOPHORECTOMY;  Surgeon: Laurette Schimke, MD;  Location: [facility] ORS;  Service: Gynecology;  Laterality: N/A;   TONSILLECTOMY     TOTAL KNEE ARTHROPLASTY Right 03/19/2022   Procedure: TOTAL KNEE ARTHROPLASTY;  Surgeon: Ollen Gross, MD;  Location: [facility] ORS;  Service: Orthopedics;  Laterality: Right;   TUBAL LIGATION     WRIST SURGERY Right    Patient Active Problem List   Diagnosis Date Noted   OA (osteoarthritis) of knee 03/19/2022   Cervical spondylosis with myelopathy and radiculopathy 04/19/2021   Genetic testing 08/01/2020   Family history of ovarian cancer 07/14/2020   Colon polyps 07/14/2020   Family history of pancreatic cancer 07/14/2020   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   CKD (chronic kidney disease) stage 3, GFR 30-59 ml/min (HCC) 10/01/2017   History of normocytic normochromic anemia 11/25/2015   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   Spondylolisthesis of  lumbar region 12/23/2013   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   Hyperthyroidism 05/25/2013   Obesity (BMI 30-39.9) 12/30/2012   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HCC) 08/21/2011   Meningioma (HCC) 08/13/2011   DIZZINESS 11/02/2009   Type 2 diabetes mellitus, controlled (HCC) 05/20/2009   HYPOKALEMIA 05/02/2009   DEPRESSION 06/24/2008   GERD 06/24/2008   PEPTIC ULCER DISEASE 06/24/2008   TMJ SYNDROME 06/09/2008   Hyperlipemia 11/08/2006   OBSTRUCTIVE SLEEP APNEA 11/08/2006   Essential hypertension 11/08/2006   ALLERGIC RHINITIS 11/08/2006   VOCAL CORD DISORDER 11/08/2006   ASTHMA 11/08/2006   TACHYARRHYTHMIA 11/08/2006    REFERRING PROVIDER: Val Eagle  REFERRING DIAG: Spinal stenosis of lumbar region with neurogenic claudication.  Rationale for Evaluation and Treatment: Rehabilitation  THERAPY DIAG:  Other low back pain  Abnormal posture  Muscle spasm of back  ONSET DATE: ~February 26th of 2024 (after a right TKA).  SUBJECTIVE:                                                                                                                                                                                           SUBJECTIVE STATEMENT: Pt reports 3/10 right low back pain today.   PERTINENT HISTORY:  Right total knee replacement, 2 previous lumbar surgeries.  PAIN:  Are you having pain? Yes: NPRS scale: 3/10 Pain location: right low back Pain description: Sore and numb. Aggravating factors: See above. Relieving factors: Sitting.  PRECAUTIONS: None  RED FLAGS: None   WEIGHT BEARING RESTRICTIONS: No  FALLS:  Has patient fallen in last 6 months? Yes. Number of falls 1.  Flipped out of a Rocker (saw her MD).  LIVING ENVIRONMENT: Lives in: House/apartment Stairs: Uses railing.  Has following equipment at home: Single point cane  OCCUPATION: Retired.  PLOF: Independent with basic ADLs  PATIENT GOALS: Get pain down and do more around the house like she  was doing before.  OBJECTIVE:   PATIENT SURVEYS:  FOTO 37.  POSTURE: rounded shoulders, forward head, decreased lumbar lordosis, and flexed trunk .   PALPATION:    Tender and taut to palpation over right lower lumbar musculature and right SIJ and proximal gluteal musculature.  LUMBAR/LE ROM:   The patient stand in 10 degrees of lumbar flexion and she can extend to 5 degrees.  Her active trunk flexion is near normal.  Her right knee is ~5 degrees from being equal to her left knee.  We discussed her continuing to use her Zero Knee to equalize her knee extension.     LOWER EXTREMITY MMT:    She has no bilateral LE strength deficits.  LUMBAR SPECIAL TESTS:  Negative right SLR test.    GAIT: Her gait is antalgic in nature and she walks in a flexed trunk posture with a straight cane.    TODAY'S TREATMENT:                                                                                                                              DATE:   12/17/22:  Nustep level 4 x 17 minutes f/b STW/M x 6 minutes f/b  HMP and IFC at 80-150 Hz on 40% scan x 20 minutes.  Normal modality response following removal of modality.    12/14/22:                                    EXERCISE LOG  RT side   LB  Exercise Repetitions and Resistance Comments  Nustep Lvl 4 x 15 mins   Rockerboard    LAQs    Seated Marches    Seated Hip Abduction    Seated Hip Adduction     Blank cell = exercise not performed today   Manual Therapy Soft Tissue Mobilization: Lumbar, STW/M to right lumbar paraspinals and QL to decrease pain and tone with pt in left side-lying for comfort     Modalities  Date:  Unattended Estim: Lumbar, IFC 80-150 Hz, 15 mins, Pain Combo: Lumbar, 1.5 w/cm2, 15 mins, Pain Hot Pack: Lumbar, 15 mins, Pain and Tone   11/05/22                                   EXERCISE LOG  Exercise Repetitions and Resistance Comments  Nustep  Level 4 x 15 minutes.        Patient in left SDLY  position with folded pillow between  knees for comfort:   STW/M x 10 minutes f/b HMP and IFC at 80-150 Hz on 40% scan x 20  minutes. Normal modality response following removal of modality.  PATIENT EDUCATION:  Education details: As below. Person educated: Patient Education method: Explanation Education comprehension: verbalized understanding.  HOME EXERCISE PROGRAM: Bilateral LE extension with draw-in with bilateral UE support on countertop.  ASSESSMENT:  CLINICAL IMPRESSION: Patient presents to the clinic today with a 3/10 pain-level.  See discharge summary below.  OBJECTIVE IMPAIRMENTS: Abnormal gait, decreased activity tolerance, difficulty walking, decreased ROM, increased muscle spasms, postural dysfunction, and pain.   ACTIVITY LIMITATIONS: carrying, lifting, bending, standing, transfers, and locomotion level  PARTICIPATION LIMITATIONS: meal prep, cleaning, and laundry  PERSONAL FACTORS: Time since onset of injury/illness/exacerbation and 1-2 comorbidities: 2 prior lumbar surgeries and a right total knee replacement  are also affecting patient's functional outcome.   REHAB POTENTIAL: Good  CLINICAL DECISION MAKING: Stable/uncomplicated  EVALUATION COMPLEXITY: Low   GOALS:  SHORT TERM GOALS: Target date: 11/05/22  Ind with a initial HEP. Goal status: MET  LONG TERM GOALS: Target date: 12/03/22  Ind with an advanced HEP.  Goal status: IN PROGRESS  2.  Stand 20 minutes with pain not > 3-4/10.  Goal status: MET  3.  Walk without antalgia.  Goal status: Partially met.  4.  Perform ADL's with pain not > 3-4/10.  Goal status: Partially met.  PLAN:  PT FREQUENCY: 2x/week  PT DURATION: 6 weeks  PLANNED INTERVENTIONS: Therapeutic exercises, Therapeutic activity, Gait training, Patient/Family education, Self Care, Dry Needling, Electrical stimulation, Cryotherapy, Moist heat, Ultrasound, and Manual therapy.  PLAN FOR NEXT SESSION: Combo e'stim/US, STW/M , draw-in  progression.  Core exercise progression.  Postural improvement exercises. Draw-in progression,   PHYSICAL THERAPY DISCHARGE SUMMARY  Visits from Start of Care: 9.  Current functional level related to goals / functional outcomes: See above.   Remaining deficits: Patient reports overall she has improved from her PT sessions.  She does, however, continue to have some remaining right-sided low back pain with goals partially met.   Education / Equipment: HEP.   Patient agrees to discharge. Patient goals were partially met. Patient is being discharged due to being pleased with the current functional level.   Mahlani Berninger, [place], PT 12/17/2022, 11:55 AM

## 2022-12-19 ENCOUNTER — Ambulatory Visit: Payer: Medicare Other | Admitting: Physical Therapy

## 2022-12-19 ENCOUNTER — Other Ambulatory Visit: Payer: Self-pay | Admitting: Neurosurgery

## 2022-12-24 ENCOUNTER — Other Ambulatory Visit: Payer: Self-pay | Admitting: Family Medicine

## 2023-01-02 ENCOUNTER — Other Ambulatory Visit: Payer: Self-pay | Admitting: Otolaryngology

## 2023-01-02 DIAGNOSIS — E079 Disorder of thyroid, unspecified: Secondary | ICD-10-CM

## 2023-01-02 DIAGNOSIS — E892 Postprocedural hypoparathyroidism: Secondary | ICD-10-CM

## 2023-01-02 NOTE — Pre-Procedure Instructions (Signed)
Surgical Instructions    Your procedure is scheduled on Wednesday, December 18th  Report to [facility] Main Entrance "A" at 0830 A.M., then check in with the Admitting office.  Call this number if you have problems the morning of surgery:  [phone]  If you have any questions prior to your surgery date call [phone]: Open Monday-Friday 8am-4pm If you experience any cold or flu symptoms such as cough, fever, chills, shortness of breath, etc. between now and your scheduled surgery, please notify us at the above number.     Remember:  Do not eat or drink after midnight the night before your surgery     Take these medicines the morning of surgery with A SIP OF WATER :            acetaminophen (TYLENOL)             amLODipine (NORVASC)             levothyroxine (SYNTHROID)             metoprolol succinate (TOPROL-XL)             RABEprazole (ACIPHEX)             rosuvastatin (CRESTOR)             solifenacin (VESICARE)   As needed:          cetirizine (ZYRTEC)           meclizine (ANTIVERT)           Propylene Glycol (SYSTANE BALANCE) eye drops          valACYclovir (VALTREX)   WHAT DO I DO ABOUT MY DIABETES MEDICATION?   Do not take oral diabetes medicines (pills) -SitaGLIPtin-MetFORMIN HCl  the morning of surgery. Last dose- 01/08/2023    HOW TO MANAGE YOUR DIABETES BEFORE AND AFTER SURGERY  Why is it important to control my blood sugar before and after surgery? Improving blood sugar levels before and after surgery helps healing and can limit problems. A way of improving blood sugar control is eating a healthy diet by:  Eating less sugar and carbohydrates  Increasing activity/exercise  Talking with your doctor about reaching your blood sugar goals High blood sugars (greater than 180 mg/dL) can raise your risk of infections and slow your recovery, so you will need to focus on controlling your diabetes during the weeks before surgery. Make sure that the doctor who  takes care of your diabetes knows about your planned surgery including the date and location.  How do I manage my blood sugar before surgery? Check your blood sugar at least 4 times a day, starting 2 days before surgery, to make sure that the level is not too high or low.  Check your blood sugar the morning of your surgery when you wake up and every 2 hours until you get to the Short Stay unit.  If your blood sugar is less than 70 mg/dL, you will need to treat for low blood sugar: Do not take insulin. Treat a low blood sugar (less than 70 mg/dL) with  cup of clear juice (cranberry or apple), 4 glucose tablets, OR glucose gel. Recheck blood sugar in 15 minutes after treatment (to make sure it is greater than 70 mg/dL). If your blood sugar is not greater than 70 mg/dL on recheck, call [phone] for further instructions. Report your blood sugar to the short stay nurse when you get to Short Stay.  If you are admitted to the  hospital after surgery: Your blood sugar will be checked by the staff and you will probably be given insulin after surgery (instead of oral diabetes medicines) to make sure you have good blood sugar levels. The goal for blood sugar control after surgery is 80-180 mg/dL.    As of today, STOP taking any Aspirin (unless otherwise instructed by your surgeon) Aleve, Naproxen, Ibuprofen, Motrin, Advil, Goody's, BC's, all herbal medications, fish oil, and all vitamins.                     Do NOT Smoke (Tobacco/Vaping) for 24 hours prior to your procedure.  If you use a CPAP at night, you may bring your mask/headgear for your overnight stay.   Contacts, glasses, piercing's, hearing aid's, dentures or partials may not be worn into surgery, please bring cases for these belongings.    For patients admitted to the hospital, discharge time will be determined by your treatment team.   Patients discharged the day of surgery will not be allowed to drive home, and someone needs to  stay with them for 24 hours.  SURGICAL WAITING ROOM VISITATION Patients having surgery or a procedure may have no more than 2 support people in the waiting area - these visitors may rotate.   Children under the age of 26 must have an adult with them who is not the patient. If the patient needs to stay at the hospital during part of their recovery, the visitor guidelines for inpatient rooms apply. Pre-op nurse will coordinate an appropriate time for 1 support person to accompany patient in pre-op.  This support person may not rotate.   Please refer to the [facility] website for the visitor guidelines for Inpatients (after your surgery is over and you are in a regular room).    Special instructions:   [facility]- Preparing For Surgery  Before surgery, you can play an important role. Because skin is not sterile, your skin needs to be as free of germs as possible. You can reduce the number of germs on your skin by washing with CHG (chlorahexidine gluconate) Soap before surgery.  CHG is an antiseptic cleaner which kills germs and bonds with the skin to continue killing germs even after washing.    Oral Hygiene is also important to reduce your risk of infection.  Remember - BRUSH YOUR TEETH THE MORNING OF SURGERY WITH YOUR REGULAR TOOTHPASTE  Please do not use if you have an allergy to CHG or antibacterial soaps. If your skin becomes reddened/irritated stop using the CHG.  Do not shave (including legs and underarms) for at least 48 hours prior to first CHG shower. It is OK to shave your face.  Please follow these instructions carefully.   Shower the NIGHT BEFORE SURGERY and the MORNING OF SURGERY  If you chose to wash your hair, wash your hair first as usual with your normal shampoo.  After you shampoo, rinse your hair and body thoroughly to remove the shampoo.  Use CHG Soap as you would any other liquid soap. You can apply CHG directly to the skin and wash gently with a scrungie or a clean  washcloth.   Apply the CHG Soap to your body ONLY FROM THE NECK DOWN.  Do not use on open wounds or open sores. Avoid contact with your eyes, ears, mouth and genitals (private parts). Wash Face and genitals (private parts)  with your normal soap.   Wash thoroughly, paying special attention to the area where your  surgery will be performed.  Thoroughly rinse your body with warm water from the neck down.  DO NOT shower/wash with your normal soap after using and rinsing off the CHG Soap.  Pat yourself dry with a CLEAN TOWEL.  Wear CLEAN PAJAMAS to bed the night before surgery  Place CLEAN SHEETS on your bed the night before your surgery  DO NOT SLEEP WITH PETS.   Day of Surgery: Take a shower with CHG soap. Do not wear jewelry or makeup Do not wear lotions, powders, perfumes/colognes, or deodorant. Do not shave 48 hours prior to surgery.  Men may shave face and neck. Do not bring valuables to the hospital.  [facility] is not responsible for any belongings or valuables. Do not wear nail polish, gel polish, artificial nails, or any other type of covering on natural nails (fingers and toes) If you have artificial nails or gel coating that need to be removed by a nail salon, please have this removed prior to surgery. Artificial nails or gel coating may interfere with anesthesia's ability to adequately monitor your vital signs. Wear Clean/Comfortable clothing the morning of surgery Remember to brush your teeth WITH YOUR REGULAR TOOTHPASTE.   Please read over the following fact sheets that you were given.    If you received a COVID test during your pre-op visit  it is requested that you wear a mask when out in public, stay away from anyone that may not be feeling well and notify your surgeon if you develop symptoms. If you have been in contact with anyone that has tested positive in the last 10 days please notify you surgeon.

## 2023-01-03 ENCOUNTER — Encounter (HOSPITAL_COMMUNITY): Payer: Self-pay

## 2023-01-03 ENCOUNTER — Encounter (HOSPITAL_COMMUNITY)
Admission: RE | Admit: 2023-01-03 | Discharge: 2023-01-03 | Disposition: A | Discharge status: Home / Self Care | Payer: Medicare Other | Source: Ambulatory Visit | Attending: Neurosurgery | Admitting: Neurosurgery

## 2023-01-03 ENCOUNTER — Other Ambulatory Visit: Payer: Self-pay

## 2023-01-03 VITALS — BP 154/77 | Temp 98.5°F | Resp 18 | Ht 64.0 in | Wt 240.1 lb

## 2023-01-03 DIAGNOSIS — K219 Gastro-esophageal reflux disease without esophagitis: Secondary | ICD-10-CM | POA: Diagnosis not present

## 2023-01-03 DIAGNOSIS — E039 Hypothyroidism, unspecified: Secondary | ICD-10-CM | POA: Diagnosis not present

## 2023-01-03 DIAGNOSIS — I129 Hypertensive chronic kidney disease with stage 1 through stage 4 chronic kidney disease, or unspecified chronic kidney disease: Secondary | ICD-10-CM | POA: Insufficient documentation

## 2023-01-03 DIAGNOSIS — E1122 Type 2 diabetes mellitus with diabetic chronic kidney disease: Secondary | ICD-10-CM | POA: Insufficient documentation

## 2023-01-03 DIAGNOSIS — G4733 Obstructive sleep apnea (adult) (pediatric): Secondary | ICD-10-CM | POA: Insufficient documentation

## 2023-01-03 DIAGNOSIS — N189 Chronic kidney disease, unspecified: Secondary | ICD-10-CM | POA: Insufficient documentation

## 2023-01-03 DIAGNOSIS — Z01812 Encounter for preprocedural laboratory examination: Secondary | ICD-10-CM | POA: Insufficient documentation

## 2023-01-03 DIAGNOSIS — M51369 Other intervertebral disc degeneration, lumbar region without mention of lumbar back pain or lower extremity pain: Secondary | ICD-10-CM | POA: Diagnosis not present

## 2023-01-03 DIAGNOSIS — Z01818 Encounter for other preprocedural examination: Secondary | ICD-10-CM

## 2023-01-03 DIAGNOSIS — E119 Type 2 diabetes mellitus without complications: Secondary | ICD-10-CM

## 2023-01-03 DIAGNOSIS — I10 Essential (primary) hypertension: Secondary | ICD-10-CM

## 2023-01-03 LAB — HEMOGLOBIN A1C
Hgb A1c MFr Bld: 6.6 % — ABNORMAL HIGH (ref 4.8–5.6)
Mean Plasma Glucose: 142.72 mg/dL

## 2023-01-03 LAB — CBC
HCT: 37.6 % (ref 36.0–46.0)
Hemoglobin: 12.1 g/dL (ref 12.0–15.0)
MCH: 26 pg (ref 26.0–34.0)
MCHC: 32.2 g/dL (ref 30.0–36.0)
MCV: 80.9 fL (ref 80.0–100.0)
Platelets: 184 10*3/uL (ref 150–400)
RBC: 4.65 MIL/uL (ref 3.87–5.11)
RDW: 14.3 % (ref 11.5–15.5)
WBC: 4.8 10*3/uL (ref 4.0–10.5)
nRBC: 0 % (ref 0.0–0.2)

## 2023-01-03 LAB — BASIC METABOLIC PANEL
Anion gap: 10 (ref 5–15)
BUN: 21 mg/dL (ref 8–23)
CO2: 24 mmol/L (ref 22–32)
Calcium: 8.8 mg/dL — ABNORMAL LOW (ref 8.9–10.3)
Chloride: 100 mmol/L (ref 98–111)
Creatinine, Ser: 1.19 mg/dL — ABNORMAL HIGH (ref 0.44–1.00)
GFR, Estimated: 46 mL/min — ABNORMAL LOW (ref >60)
Glucose, Bld: 108 mg/dL — ABNORMAL HIGH (ref 70–99)
Potassium: 4.1 mmol/L (ref 3.5–5.1)
Sodium: 134 mmol/L — ABNORMAL LOW (ref 135–145)

## 2023-01-03 LAB — GLUCOSE, CAPILLARY: Glucose-Capillary: 102 mg/dL — ABNORMAL HIGH (ref 70–99)

## 2023-01-03 LAB — TYPE AND SCREEN
ABO/RH(D): O POS
Antibody Screen: NEGATIVE

## 2023-01-03 NOTE — Progress Notes (Signed)
SDW CALL  Patient was given pre-op instructions over the phone. The opportunity was given for the patient to ask questions. No further questions asked. Patient verbalized understanding of instructions given.   PCP - Dr. Evelena Peat Cardiologist - Sharlene Dory  PPM/ICD - denies Device Orders - n/a Rep Notified - n/a  Chest x-ray - denies EKG - 01/29/22 Stress Test - 2013 ECHO - 2013 Cardiac Cath -denies   Sleep Study - OSA+ CPAP - denies  Fasting Blood Sugar - low 100's Checks Blood Sugar __1-2___ times a day  Last dose of GLP1 agonist-  n/a GLP1 instructions: n/a  Blood Thinner Instructions:  n/a Aspirin Instructions: n/a  ERAS Protcol - NPO   COVID TEST- n/a   Anesthesia review: yes- OSA, HTN  Patient denies shortness of breath, fever, cough and chest pain over the phone call   All instructions explained to the patient, with a verbal understanding of the material. Patient agrees to go over the instructions while at home for a better understanding.

## 2023-01-04 NOTE — Progress Notes (Addendum)
DISCUSSION: Dawn Salas is an 82 yo female who is SDW for FRONTAL CRANIOTOMY and APPLICATION OF CRANIAL NAVIGATION on 01/09/23 with Dr. Lovell Sheehan. PMH of HTN, OSA (no CPAP use), GERD, T2DM, hypothyroidism, CKD, benign meningiomas, cervical DDD s/p cervical fusion (03/2021), lumbar DDD s/p lumbar fusion (2015, 2021).  Patient has had multiple surgeries for intracranial meningiomas with Dr. Lovell Sheehan. 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 She was advised to remain on Keppra long term however does not appear she currently takes any AEDs.  Patient most recently had R TKA on 03/19/22 which was uncomplicated.  Patient follows with cardiology for palpitations, HTN, HLD. Does not have cardiac hx. Last seen on 11/12/22. Prior testing includes nuclear stress test in 2013 at [facility], no ischemia noted.  Echocardiogram that same year revealed EF 60 to 65%, no significant valvular abnormality, no WMA's.   VS: BP (!) 154/77   Temp 36.9 C   Resp 18   Ht 5\' 4"  (1.626 m)   Wt 108.9 kg   SpO2 100%   BMI 41.21 kg/m   PROVIDERS: Kristian Covey, MD Cardiologist:  Dina Rich, MD     LABS: Labs reviewed: Acceptable for surgery. Kidney function at baseline. (all labs ordered are listed, but only abnormal results are displayed)  Labs Reviewed  HEMOGLOBIN A1C - Abnormal; Notable for the following components:      Result Value   Hgb A1c MFr Bld 6.6 (*)    All other components within normal limits  BASIC METABOLIC PANEL - Abnormal; Notable for the following components:   Sodium 134 (*)    Glucose, Bld 108 (*)    Creatinine, Ser 1.19 (*)    Calcium 8.8 (*)    GFR, Estimated 46 (*)    All other components within normal limits  GLUCOSE, CAPILLARY - Abnormal; Notable for the following components:   Glucose-Capillary 102 (*)    All other components within normal  limits  CBC  TYPE AND SCREEN     IMAGES:   EKG:   CV:  Stress test 06/28/2011:  The stress electrocardiogram was normal. Myocardial perfusion imaging is normal. There is no evidence of ischemia or scar. SPECT images demonstrate homogeneous tracer distribution throughout the myocardium. Gated SPECT images reveals normal myocardial thickening and wall motion. The left ventricular ejection fracture was normal.  Echo 06/20/2011:  Conclusions:  Sinus rhythm This was a technically adequate study LV size, wall thickness and systolic function are normal Overall LV systolic function is norma with an EF between 60-65% The RV is normal in size and function The aortic valve is trileaflet, and appears structurally normal. No aortic stenosis or regurgitation There is trace mitral regurgitation Trace tricuspid regurgitation present There is no evidence of pulmonary HTN There is no pericardial effusion  Past Medical History:  Diagnosis Date   Allergy    Anxiety    Arthritis    Asthma    patient denies    Cataract    bilateral lens implants    Colon polyps    Depression    Diabetes mellitus    Type two   Diverticulitis    Double vision 2019   left eye   Family history of adverse reaction to anesthesia    daughter- N/V    Family history of ovarian cancer    Family history of pancreatic cancer    Frequency  of urination    GERD (gastroesophageal reflux disease)    History of adverse reaction to anesthesia 03/19/2022   After regional block for TKA pt states she had uncontrollable frequent blinking and "zoned out" was unable to speak more than one or two words for about 10 minutes   History of hiatal hernia    Hyperglycemia    Hyperlipidemia    Hypertension    Hyperthyroidism    PT HAD BIPOSY -NEGATIVE.Marland Kitchen AND NOW JUST GETS CHECKED EACH YEAR .Marland Kitchen 2013 LAST TEST   SEES DR. [name].    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 DOES  NOT USE C PAP   Palpitations    Peptic ulcer disease    Pneumonia    "walking pneumonia" hx of   Seizures (HCC) 2015   one siezure with brain surgery none since 2015    Sleep apnea    no CPAP   Urinary tract infection 03/2020   Vertigo     Past Surgical History:  Procedure Laterality Date   ABDOMINAL HYSTERECTOMY     ANTERIOR CERVICAL DECOMP/DISCECTOMY FUSION N/A 04/19/2021   Procedure: Anterior Cervical Discectomy and Fusion with Interbody Prosthesis, Plates/Screws Cervical Three-Four/Cervical Four-Five REMoval CERVICAL PLATE;  Surgeon: Tressie Stalker, MD;  Location: [facility] OR;  Service: Neurosurgery;  Laterality: N/A;  3C   BACK SURGERY     L SPIN 2003   NECK 2004   BRAIN MENINGIOMA EXCISION  08/2008   X 2   C5-C6 neck fusion     CATARACT EXTRACTION Bilateral    COLONOSCOPY  05/05/2009   pt states multiple polyps removed in both colonoscopies she has had   CRANIOTOMY  08/13/2011   Procedure: CRANIOTOMY TUMOR EXCISION;  Surgeon: Cristi Loron, MD;  Location: [facility];  Service: Neurosurgery;  Laterality: Bilateral;  Bifrontal craniotomy for tumor   DILATION AND CURETTAGE OF UTERUS     YEARS AGO   L4-L5 posterior la  2021   L3-L5 fusion   left foot plantar and hammertoe     right foot bunectomy     right knee arthroscopy     x 3   right shoulder rotator cuff     ROBOTIC ASSISTED BILATERAL SALPINGO OOPHERECTOMY N/A 08/25/2015   Procedure: XI ROBOTIC ASSISTED BILATERAL SALPINGO OOPHORECTOMY;  Surgeon: Laurette Schimke, MD;  Location: [facility];  Service: Gynecology;  Laterality: N/A;   TONSILLECTOMY     TOTAL KNEE ARTHROPLASTY Right 03/19/2022   Procedure: TOTAL KNEE ARTHROPLASTY;  Surgeon: Ollen Gross, MD;  Location: [facility];  Service: Orthopedics;  Laterality: Right;   TUBAL LIGATION     WRIST SURGERY Right     MEDICATIONS:  ACCU-CHEK AVIVA PLUS test strip   Accu-Chek Softclix Lancets lancets   acetaminophen (TYLENOL) 650 MG CR tablet   amLODipine (NORVASC) 5 MG  tablet   ascorbic acid (VITAMIN C) 500 MG tablet   cetirizine (ZYRTEC) 10 MG tablet   Flaxseed, Linseed, (FLAX SEED OIL) 1000 MG CAPS   Iron-Vitamins (GERITOL COMPLETE PO)   Lancet Devices (ACCU-CHEK SOFTCLIX) lancets   levothyroxine (SYNTHROID) 100 MCG tablet   losartan-hydrochlorothiazide (HYZAAR) 100-12.5 MG tablet   Magnesium 250 MG TABS   meclizine (ANTIVERT) 25 MG tablet   metoprolol succinate (TOPROL-XL) 50 MG 24 hr tablet   potassium chloride (KLOR-CON) 10 MEQ tablet   Propylene Glycol (SYSTANE BALANCE) 0.6 % SOLN   RABEprazole (ACIPHEX) 20 MG tablet   rosuvastatin (  CRESTOR) 40 MG tablet   SitaGLIPtin-MetFORMIN HCl (JANUMET XR) 50-500 MG TB24   solifenacin (VESICARE) 5 MG tablet   valACYclovir (VALTREX) 1000 MG tablet   No current facility-administered medications for this encounter.   Marcille Blanco [facility] Short Stay/Anesthesiology [facility] Phone [phone] 01/04/2023 11:53 AM

## 2023-01-04 NOTE — Anesthesia Preprocedure Evaluation (Signed)
Anesthesia Evaluation  Patient identified by MRN, date of birth, ID band Patient awake    Reviewed: Allergy & Precautions, NPO status , Patient's Chart, lab work & pertinent test results  History of Anesthesia Complications (+) history of anesthetic complications (had issue after nerve block for TKA in 02/2022- zoned out for about )  Airway Mallampati: III  TM Distance: >3 FB Neck ROM: Full    Dental  (+) Edentulous Upper, Partial Lower   Pulmonary asthma , sleep apnea (noncompliant w/ CPAP)    Pulmonary exam normal breath sounds clear to auscultation       Cardiovascular hypertension (150/74 preop, per pt normally 130-140SBP), Normal cardiovascular exam Rhythm:Regular Rate:Normal     Neuro/Psych Seizures -, Well Controlled,  PSYCHIATRIC DISORDERS Anxiety Depression    Meningioma     GI/Hepatic Neg liver ROS, hiatal hernia, PUD,GERD  Controlled,,  Endo/Other  diabetes, Well Controlled, Type 2, Oral Hypoglycemic Agents    Renal/GU negative Renal ROS  negative genitourinary   Musculoskeletal  (+) Arthritis , Osteoarthritis,    Abdominal  (+) + obese  Peds  Hematology negative hematology ROS (+)   Anesthesia Other Findings   Reproductive/Obstetrics negative OB ROS                             Anesthesia Physical Anesthesia Plan  ASA: 3  Anesthesia Plan: General   Post-op Pain Management: Tylenol PO (pre-op)*   Induction: Intravenous  PONV Risk Score and Plan: 3 and Ondansetron, Dexamethasone, Midazolam and Treatment may vary due to age or medical condition  Airway Management Planned: Oral ETT  Additional Equipment: Arterial line  Intra-op Plan:   Post-operative Plan: Extubation in OR  Informed Consent: I have reviewed the patients History and Physical, chart, labs and discussed the procedure including the risks, benefits and alternatives for the proposed anesthesia with the  patient or authorized representative who has indicated his/her understanding and acceptance.     Dental advisory given  Plan Discussed with: CRNA  Anesthesia Plan Comments: ( )        Anesthesia Quick Evaluation

## 2023-01-08 ENCOUNTER — Telehealth: Payer: Self-pay

## 2023-01-08 ENCOUNTER — Encounter: Payer: Self-pay | Admitting: Endocrinology

## 2023-01-08 NOTE — Progress Notes (Signed)
Patient voiced understanding of new arrival time of 0800 tomorrow

## 2023-01-08 NOTE — Telephone Encounter (Signed)
Generally the medication you have been taking for diabetes Janumet does not cause low sugar problem.  If you have been experiencing low sugar and if you are not eating much you you can hold the medicine.  I see that you have plan for hospitalization for surgery tomorrow.  You will be probably monitored for blood sugar and treat with insulin as needed by hospital providers.  After you are discharged from hospital I would recommend to continue 1 tablet daily as it is important to keep blood sugar control after the surgery to avoid infection as well.  If you continue to have problem at that time please call our clinic.  We can discuss or make a plan at that time or at the follow-up visit in January 8.  [place] Sophia Cubero, MD [facility] [address] [place], [address] [place], [place] [address] Phone # [phone]

## 2023-01-08 NOTE — Telephone Encounter (Signed)
Patient wondering if she should decrease her Janumet to 1/2 tab due blood glucose levels running from 74-116. RN made patient aware that the numbers are normal and questioned if she was feeling symptomatic, stated she feels a little different when it drops below 100. Spoke with MD advised patient to stay on same regimen due to normal glucose numbers and patient will be having surgery tomorrow which may result in increase of numbers at that time. No further questions at this time from patient.

## 2023-01-09 ENCOUNTER — Other Ambulatory Visit: Payer: Self-pay

## 2023-01-09 ENCOUNTER — Encounter (HOSPITAL_COMMUNITY)
Admission: RE | Disposition: A | Discharge status: Home / Self Care | Payer: Self-pay | Source: Home / Self Care | Attending: Neurosurgery

## 2023-01-09 ENCOUNTER — Inpatient Hospital Stay (HOSPITAL_COMMUNITY): Payer: Medicare Other | Admitting: Medical

## 2023-01-09 ENCOUNTER — Encounter (HOSPITAL_COMMUNITY): Payer: Self-pay | Admitting: Neurosurgery

## 2023-01-09 ENCOUNTER — Inpatient Hospital Stay (HOSPITAL_COMMUNITY)
Admission: RE | Admit: 2023-01-09 | Discharge: 2023-01-11 | DRG: 027 | Disposition: A | Discharge status: Home / Self Care | Payer: Medicare Other | Attending: Neurosurgery | Admitting: Neurosurgery

## 2023-01-09 ENCOUNTER — Inpatient Hospital Stay (HOSPITAL_COMMUNITY): Payer: Medicare Other

## 2023-01-09 DIAGNOSIS — G4733 Obstructive sleep apnea (adult) (pediatric): Secondary | ICD-10-CM

## 2023-01-09 DIAGNOSIS — Z7984 Long term (current) use of oral hypoglycemic drugs: Secondary | ICD-10-CM | POA: Diagnosis not present

## 2023-01-09 DIAGNOSIS — E785 Hyperlipidemia, unspecified: Secondary | ICD-10-CM | POA: Diagnosis present

## 2023-01-09 DIAGNOSIS — F32A Depression, unspecified: Secondary | ICD-10-CM | POA: Diagnosis present

## 2023-01-09 DIAGNOSIS — Z885 Allergy status to narcotic agent status: Secondary | ICD-10-CM | POA: Diagnosis not present

## 2023-01-09 DIAGNOSIS — Z833 Family history of diabetes mellitus: Secondary | ICD-10-CM | POA: Diagnosis not present

## 2023-01-09 DIAGNOSIS — K219 Gastro-esophageal reflux disease without esophagitis: Secondary | ICD-10-CM | POA: Diagnosis present

## 2023-01-09 DIAGNOSIS — Z8261 Family history of arthritis: Secondary | ICD-10-CM

## 2023-01-09 DIAGNOSIS — Z96651 Presence of right artificial knee joint: Secondary | ICD-10-CM | POA: Diagnosis present

## 2023-01-09 DIAGNOSIS — Z79899 Other long term (current) drug therapy: Secondary | ICD-10-CM | POA: Diagnosis not present

## 2023-01-09 DIAGNOSIS — Z888 Allergy status to other drugs, medicaments and biological substances status: Secondary | ICD-10-CM

## 2023-01-09 DIAGNOSIS — Z8041 Family history of malignant neoplasm of ovary: Secondary | ICD-10-CM

## 2023-01-09 DIAGNOSIS — Z8249 Family history of ischemic heart disease and other diseases of the circulatory system: Secondary | ICD-10-CM | POA: Diagnosis not present

## 2023-01-09 DIAGNOSIS — D320 Benign neoplasm of cerebral meninges: Secondary | ICD-10-CM

## 2023-01-09 DIAGNOSIS — E119 Type 2 diabetes mellitus without complications: Secondary | ICD-10-CM | POA: Diagnosis present

## 2023-01-09 DIAGNOSIS — Z79890 Hormone replacement therapy (postmenopausal): Secondary | ICD-10-CM

## 2023-01-09 DIAGNOSIS — Z9889 Other specified postprocedural states: Principal | ICD-10-CM

## 2023-01-09 DIAGNOSIS — Z886 Allergy status to analgesic agent status: Secondary | ICD-10-CM

## 2023-01-09 DIAGNOSIS — Z981 Arthrodesis status: Secondary | ICD-10-CM | POA: Diagnosis not present

## 2023-01-09 DIAGNOSIS — Z800 Family history of malignant neoplasm of digestive organs: Secondary | ICD-10-CM | POA: Diagnosis not present

## 2023-01-09 DIAGNOSIS — E039 Hypothyroidism, unspecified: Secondary | ICD-10-CM | POA: Diagnosis present

## 2023-01-09 DIAGNOSIS — Z880 Allergy status to penicillin: Secondary | ICD-10-CM | POA: Diagnosis not present

## 2023-01-09 DIAGNOSIS — I10 Essential (primary) hypertension: Secondary | ICD-10-CM | POA: Diagnosis present

## 2023-01-09 DIAGNOSIS — D496 Neoplasm of unspecified behavior of brain: Secondary | ICD-10-CM | POA: Diagnosis present

## 2023-01-09 HISTORY — PX: CRANIOTOMY: SHX93

## 2023-01-09 HISTORY — PX: APPLICATION OF CRANIAL NAVIGATION: SHX6578

## 2023-01-09 LAB — GLUCOSE, CAPILLARY
Glucose-Capillary: 106 mg/dL — ABNORMAL HIGH (ref 70–99)
Glucose-Capillary: 104 mg/dL — ABNORMAL HIGH (ref 70–99)
Glucose-Capillary: 168 mg/dL — ABNORMAL HIGH (ref 70–99)
Glucose-Capillary: 171 mg/dL — ABNORMAL HIGH (ref 70–99)
Glucose-Capillary: 269 mg/dL — ABNORMAL HIGH (ref 70–99)

## 2023-01-09 SURGERY — CRANIOTOMY TUMOR EXCISION
Anesthesia: General | Laterality: Left

## 2023-01-09 MED ORDER — SUGAMMADEX SODIUM 200 MG/2ML IV SOLN
INTRAVENOUS | Status: DC | PRN
  Administered 2023-01-09: 400 mg via INTRAVENOUS

## 2023-01-09 MED ORDER — MORPHINE SULFATE (PF) 2 MG/ML IV SOLN: 2.0000 mg | INTRAVENOUS | Status: DC | PRN

## 2023-01-09 MED ORDER — CLEVIDIPINE BUTYRATE 0.5 MG/ML IV EMUL
INTRAVENOUS | Status: AC
  Administered 2023-01-09: 2 mg/h via INTRAVENOUS
  Filled 2023-01-09: qty 50

## 2023-01-09 MED ORDER — ARTIFICIAL TEARS OPHTHALMIC OINT
TOPICAL_OINTMENT | OPHTHALMIC | Status: AC
  Filled 2023-01-09: qty 3.5

## 2023-01-09 MED ORDER — PROPOFOL 1000 MG/100ML IV EMUL
INTRAVENOUS | Status: AC
  Filled 2023-01-09: qty 100

## 2023-01-09 MED ORDER — BUPIVACAINE-EPINEPHRINE (PF) 0.5% -1:200000 IJ SOLN
INTRAMUSCULAR | Status: AC
  Filled 2023-01-09: qty 30

## 2023-01-09 MED ORDER — LOSARTAN POTASSIUM-HCTZ 100-12.5 MG PO TABS
0.5000 | ORAL_TABLET | Freq: Every day | ORAL | Status: DC
Start: 2023-01-09 — End: 2023-01-09

## 2023-01-09 MED ORDER — THROMBIN 20000 UNITS EX SOLR
CUTANEOUS | Status: AC
  Filled 2023-01-09: qty 20000

## 2023-01-09 MED ORDER — ONDANSETRON HCL 4 MG/2ML IJ SOLN
INTRAMUSCULAR | Status: AC
  Filled 2023-01-09: qty 2

## 2023-01-09 MED ORDER — THROMBIN 5000 UNITS EX SOLR
CUTANEOUS | Status: AC
  Filled 2023-01-09: qty 5000

## 2023-01-09 MED ORDER — THROMBIN 5000 UNITS EX SOLR
OROMUCOSAL | Status: DC | PRN
  Administered 2023-01-09: 5 mL via TOPICAL

## 2023-01-09 MED ORDER — SODIUM CHLORIDE 0.9 % IR SOLN
Status: DC | PRN
  Administered 2023-01-09: 1000 mL

## 2023-01-09 MED ORDER — SODIUM CHLORIDE 0.9 % IV SOLN: INTRAVENOUS | Status: DC | PRN

## 2023-01-09 MED ORDER — PHENYLEPHRINE HCL-NACL 20-0.9 MG/250ML-% IV SOLN
INTRAVENOUS | Status: DC | PRN
  Administered 2023-01-09: 30 ug/min via INTRAVENOUS

## 2023-01-09 MED ORDER — HEMOSTATIC AGENTS (NO CHARGE) OPTIME
TOPICAL | Status: DC | PRN
Start: 2023-01-09 — End: 2023-01-09
  Administered 2023-01-09: 1 via TOPICAL

## 2023-01-09 MED ORDER — CHLORHEXIDINE GLUCONATE CLOTH 2 % EX PADS
6.0000 | MEDICATED_PAD | Freq: Once | CUTANEOUS | Status: DC
  Administered 2023-01-09: 6 via TOPICAL

## 2023-01-09 MED ORDER — ONDANSETRON HCL 4 MG PO TABS: 4.0000 mg | ORAL_TABLET | ORAL | Status: DC | PRN

## 2023-01-09 MED ORDER — LABETALOL HCL 5 MG/ML IV SOLN
INTRAVENOUS | Status: AC
  Administered 2023-01-09: 20 mg via INTRAVENOUS
  Filled 2023-01-09: qty 4

## 2023-01-09 MED ORDER — LIDOCAINE 2% (20 MG/ML) 5 ML SYRINGE
INTRAMUSCULAR | Status: DC | PRN
  Administered 2023-01-09: 60 mg via INTRAVENOUS

## 2023-01-09 MED ORDER — INSULIN ASPART 100 UNIT/ML IJ SOLN: 0.0000 [IU] | INTRAMUSCULAR | Status: DC | PRN

## 2023-01-09 MED ORDER — AMLODIPINE BESYLATE 5 MG PO TABS: 5.0000 mg | ORAL_TABLET | Freq: Every day | ORAL | Status: DC

## 2023-01-09 MED ORDER — BACITRACIN ZINC 500 UNIT/GM EX OINT
TOPICAL_OINTMENT | CUTANEOUS | Status: AC
  Filled 2023-01-09: qty 28.35

## 2023-01-09 MED ORDER — INSULIN ASPART 100 UNIT/ML IJ SOLN
0.0000 [IU] | Freq: Three times a day (TID) | INTRAMUSCULAR | Status: DC
Start: 2023-01-10 — End: 2023-01-11
  Administered 2023-01-10: 1 [IU] via SUBCUTANEOUS
  Administered 2023-01-10 (×2): 2 [IU] via SUBCUTANEOUS
  Administered 2023-01-11: 3 [IU] via SUBCUTANEOUS

## 2023-01-09 MED ORDER — LACTATED RINGERS IV SOLN: INTRAVENOUS | Status: DC

## 2023-01-09 MED ORDER — LABETALOL HCL 5 MG/ML IV SOLN
10.0000 mg | INTRAVENOUS | Status: DC | PRN
Start: 2023-01-09 — End: 2023-01-11
  Administered 2023-01-09 (×3): 10 mg via INTRAVENOUS
  Administered 2023-01-10: 20 mg via INTRAVENOUS
  Filled 2023-01-09 (×2): qty 8

## 2023-01-09 MED ORDER — ACETAMINOPHEN 325 MG PO TABS
650.0000 mg | ORAL_TABLET | ORAL | Status: DC | PRN
  Administered 2023-01-10 – 2023-01-11 (×2): 650 mg via ORAL
  Filled 2023-01-09: qty 2

## 2023-01-09 MED ORDER — LEVOTHYROXINE SODIUM 75 MCG PO TABS
75.0000 ug | ORAL_TABLET | Freq: Every day | ORAL | Status: DC
  Administered 2023-01-10 – 2023-01-11 (×2): 75 ug via ORAL
  Filled 2023-01-09 (×2): qty 1

## 2023-01-09 MED ORDER — CLEVIDIPINE BUTYRATE 0.5 MG/ML IV EMUL
0.0000 mg/h | INTRAVENOUS | Status: AC
  Administered 2023-01-09: 2 mg/h via INTRAVENOUS
  Filled 2023-01-09: qty 50

## 2023-01-09 MED ORDER — POTASSIUM CHLORIDE IN NACL 20-0.9 MEQ/L-% IV SOLN
INTRAVENOUS | Status: AC
  Filled 2023-01-09: qty 1000

## 2023-01-09 MED ORDER — PANTOPRAZOLE SODIUM 40 MG PO TBEC
40.0000 mg | DELAYED_RELEASE_TABLET | Freq: Every day | ORAL | Status: DC
Start: 2023-01-10 — End: 2023-01-11
  Administered 2023-01-10 – 2023-01-11 (×2): 40 mg via ORAL
  Filled 2023-01-09 (×2): qty 1

## 2023-01-09 MED ORDER — HEMOSTATIC AGENTS (NO CHARGE) OPTIME
TOPICAL | Status: DC | PRN
  Administered 2023-01-09: 1 via TOPICAL

## 2023-01-09 MED ORDER — ROCURONIUM BROMIDE 10 MG/ML (PF) SYRINGE
PREFILLED_SYRINGE | INTRAVENOUS | Status: DC | PRN
  Administered 2023-01-09: 100 mg via INTRAVENOUS

## 2023-01-09 MED ORDER — LEVETIRACETAM IN NACL 500 MG/100ML IV SOLN
500.0000 mg | Freq: Two times a day (BID) | INTRAVENOUS | Status: DC
Start: 2023-01-09 — End: 2023-01-10
  Administered 2023-01-09 – 2023-01-10 (×2): 500 mg via INTRAVENOUS
  Filled 2023-01-09 (×2): qty 100

## 2023-01-09 MED ORDER — ONDANSETRON HCL 4 MG/2ML IJ SOLN: 4.0000 mg | INTRAMUSCULAR | Status: DC | PRN

## 2023-01-09 MED ORDER — ORAL CARE MOUTH RINSE: 15.0000 mL | Freq: Once | OROMUCOSAL | Status: AC

## 2023-01-09 MED ORDER — LIDOCAINE-EPINEPHRINE 1 %-1:100000 IJ SOLN
INTRAMUSCULAR | Status: AC
  Filled 2023-01-09: qty 1

## 2023-01-09 MED ORDER — CHLORHEXIDINE GLUCONATE CLOTH 2 % EX PADS: 6.0000 | MEDICATED_PAD | Freq: Once | CUTANEOUS | Status: DC

## 2023-01-09 MED ORDER — ACETAMINOPHEN 500 MG PO TABS
1000.0000 mg | ORAL_TABLET | Freq: Once | ORAL | Status: DC
  Filled 2023-01-09: qty 2

## 2023-01-09 MED ORDER — HYDROMORPHONE HCL 1 MG/ML IJ SOLN
0.5000 mg | INTRAMUSCULAR | Status: DC | PRN
  Administered 2023-01-10: 0.5 mg via INTRAVENOUS
  Filled 2023-01-09: qty 1

## 2023-01-09 MED ORDER — DOCUSATE SODIUM 100 MG PO CAPS
100.0000 mg | ORAL_CAPSULE | Freq: Two times a day (BID) | ORAL | Status: DC
  Administered 2023-01-09 – 2023-01-11 (×4): 100 mg via ORAL
  Filled 2023-01-09 (×4): qty 1

## 2023-01-09 MED ORDER — LIDOCAINE 2% (20 MG/ML) 5 ML SYRINGE
INTRAMUSCULAR | Status: AC
  Filled 2023-01-09: qty 5

## 2023-01-09 MED ORDER — ONDANSETRON HCL 4 MG/2ML IJ SOLN
INTRAMUSCULAR | Status: DC | PRN
  Administered 2023-01-09: 4 mg via INTRAVENOUS

## 2023-01-09 MED ORDER — VANCOMYCIN HCL IN DEXTROSE 1-5 GM/200ML-% IV SOLN
1000.0000 mg | INTRAVENOUS | Status: AC
  Administered 2023-01-09: 1000 mg via INTRAVENOUS
  Filled 2023-01-09: qty 200

## 2023-01-09 MED ORDER — ROSUVASTATIN CALCIUM 20 MG PO TABS
40.0000 mg | ORAL_TABLET | Freq: Every day | ORAL | Status: DC
  Administered 2023-01-10 – 2023-01-11 (×2): 40 mg via ORAL
  Filled 2023-01-09 (×2): qty 2

## 2023-01-09 MED ORDER — ROCURONIUM BROMIDE 10 MG/ML (PF) SYRINGE
PREFILLED_SYRINGE | INTRAVENOUS | Status: AC
  Filled 2023-01-09: qty 10

## 2023-01-09 MED ORDER — SODIUM CHLORIDE 0.9 % IV SOLN
INTRAVENOUS | Status: DC | PRN
  Administered 2023-01-09: 500 mg via INTRAVENOUS

## 2023-01-09 MED ORDER — CEFAZOLIN SODIUM-DEXTROSE 2-4 GM/100ML-% IV SOLN
2.0000 g | Freq: Three times a day (TID) | INTRAVENOUS | Status: AC
  Administered 2023-01-09 (×2): 2 g via INTRAVENOUS
  Filled 2023-01-09 (×2): qty 100

## 2023-01-09 MED ORDER — 0.9 % SODIUM CHLORIDE (POUR BTL) OPTIME
TOPICAL | Status: DC | PRN
  Administered 2023-01-09: 3000 mL

## 2023-01-09 MED ORDER — PROPOFOL 10 MG/ML IV BOLUS
INTRAVENOUS | Status: AC
  Filled 2023-01-09: qty 20

## 2023-01-09 MED ORDER — FENTANYL CITRATE (PF) 250 MCG/5ML IJ SOLN
INTRAMUSCULAR | Status: DC | PRN
  Administered 2023-01-09 (×3): 50 ug via INTRAVENOUS

## 2023-01-09 MED ORDER — THROMBIN 20000 UNITS EX SOLR
CUTANEOUS | Status: DC | PRN
  Administered 2023-01-09: 20 mL via TOPICAL

## 2023-01-09 MED ORDER — CHLORHEXIDINE GLUCONATE 0.12 % MT SOLN
15.0000 mL | Freq: Once | OROMUCOSAL | Status: AC
Start: 2023-01-09 — End: 2023-01-09
  Administered 2023-01-09: 15 mL via OROMUCOSAL
  Filled 2023-01-09: qty 15

## 2023-01-09 MED ORDER — HYDROCODONE-ACETAMINOPHEN 5-325 MG PO TABS
1.0000 | ORAL_TABLET | ORAL | Status: DC | PRN
  Administered 2023-01-09 – 2023-01-10 (×4): 1 via ORAL
  Filled 2023-01-09 (×5): qty 1

## 2023-01-09 MED ORDER — ORAL CARE MOUTH RINSE: 15.0000 mL | OROMUCOSAL | Status: DC | PRN

## 2023-01-09 MED ORDER — PROPOFOL 10 MG/ML IV BOLUS
INTRAVENOUS | Status: DC | PRN
  Administered 2023-01-09: 150 mg via INTRAVENOUS
  Administered 2023-01-09: 125 ug/kg/min via INTRAVENOUS
  Administered 2023-01-09: 50 mg via INTRAVENOUS

## 2023-01-09 MED ORDER — FENTANYL CITRATE (PF) 250 MCG/5ML IJ SOLN
INTRAMUSCULAR | Status: AC
  Filled 2023-01-09: qty 5

## 2023-01-09 MED ORDER — BACITRACIN ZINC 500 UNIT/GM EX OINT
TOPICAL_OINTMENT | CUTANEOUS | Status: DC | PRN
  Administered 2023-01-09 (×2): 1 via TOPICAL

## 2023-01-09 MED ORDER — DEXAMETHASONE SODIUM PHOSPHATE 10 MG/ML IJ SOLN
INTRAMUSCULAR | Status: DC | PRN
  Administered 2023-01-09: 20 mg via INTRAVENOUS

## 2023-01-09 MED ORDER — FESOTERODINE FUMARATE ER 4 MG PO TB24
4.0000 mg | ORAL_TABLET | Freq: Every day | ORAL | Status: DC
  Administered 2023-01-10 – 2023-01-11 (×2): 4 mg via ORAL
  Filled 2023-01-09 (×2): qty 1

## 2023-01-09 MED ORDER — ACETAMINOPHEN 650 MG RE SUPP
650.0000 mg | RECTAL | Status: DC | PRN
Start: 2023-01-09 — End: 2023-01-11

## 2023-01-09 MED ORDER — INSULIN ASPART 100 UNIT/ML IJ SOLN
0.0000 [IU] | Freq: Every day | INTRAMUSCULAR | Status: DC
  Administered 2023-01-09: 3 [IU] via SUBCUTANEOUS

## 2023-01-09 MED ORDER — AMLODIPINE BESYLATE 5 MG PO TABS
5.0000 mg | ORAL_TABLET | Freq: Every day | ORAL | Status: DC
  Administered 2023-01-10 – 2023-01-11 (×2): 5 mg via ORAL
  Filled 2023-01-09 (×2): qty 1

## 2023-01-09 MED ORDER — METOPROLOL SUCCINATE ER 25 MG PO TB24: 25.0000 mg | ORAL_TABLET | Freq: Every day | ORAL | Status: DC

## 2023-01-09 MED ORDER — PROMETHAZINE HCL 25 MG PO TABS: 12.5000 mg | ORAL_TABLET | ORAL | Status: DC | PRN

## 2023-01-09 MED ORDER — POTASSIUM CHLORIDE ER 10 MEQ PO TBCR
10.0000 meq | EXTENDED_RELEASE_TABLET | Freq: Every day | ORAL | Status: DC
Start: 2023-01-09 — End: 2023-01-09

## 2023-01-09 MED ORDER — LIDOCAINE-EPINEPHRINE 1 %-1:100000 IJ SOLN
INTRAMUSCULAR | Status: DC | PRN
  Administered 2023-01-09: 10 mL

## 2023-01-09 MED ORDER — PHENYLEPHRINE 80 MCG/ML (10ML) SYRINGE FOR IV PUSH (FOR BLOOD PRESSURE SUPPORT)
PREFILLED_SYRINGE | INTRAVENOUS | Status: DC | PRN
  Administered 2023-01-09 (×2): 80 ug via INTRAVENOUS

## 2023-01-09 MED ORDER — ARTIFICIAL TEARS OPHTHALMIC OINT
TOPICAL_OINTMENT | OPHTHALMIC | Status: DC | PRN
  Administered 2023-01-09: 1 via OPHTHALMIC

## 2023-01-09 MED ORDER — DEXAMETHASONE SODIUM PHOSPHATE 10 MG/ML IJ SOLN
INTRAMUSCULAR | Status: AC
  Filled 2023-01-09: qty 1

## 2023-01-09 SURGICAL SUPPLY — 80 items
BAG COUNTER SPONGE SURGICOUNT (BAG) ×1 IMPLANT
BIT DRILL WIRE PASS 1.3MM (BIT) IMPLANT
BLADE CLIPPER SPEC (BLADE) IMPLANT
BLADE ULTRA TIP 2M (BLADE) IMPLANT
BUR PRECISION FLUTE 6.0 (BURR) ×1 IMPLANT
BUR SPIRAL ROUTER 2.3 (BUR) IMPLANT
BUR TAPERED ROUTER 3.0 (BURR) IMPLANT
CANISTER SUCT 3000ML PPV (MISCELLANEOUS) ×4 IMPLANT
CASSETTE SUCT IRRIG SONOPET IQ (MISCELLANEOUS) IMPLANT
CATH VENTRIC 35X38 W/TROCAR LG (CATHETERS) IMPLANT
CLIP TI MEDIUM 6 (CLIP) IMPLANT
CNTNR URN SCR LID CUP LEK RST (MISCELLANEOUS) ×1 IMPLANT
COVER BACK TABLE 60X90IN (DRAPES) IMPLANT
COVERAGE SUPPORT O-ARM STEALTH (MISCELLANEOUS) ×1 IMPLANT
DRAIN 10X20 3/4 PERF LF SIL ST (DRAIN) IMPLANT
DRAPE MICROSCOPE SLANT 54X150 (MISCELLANEOUS) ×1 IMPLANT
DRAPE NEUROLOGICAL W/INCISE (DRAPES) ×1 IMPLANT
DRAPE SURG 17X23 STRL (DRAPES) IMPLANT
DRAPE WARM FLUID 44X44 (DRAPES) ×1 IMPLANT
DRILL WIRE PASS 1.3MM (BIT)
DRSG OPSITE POSTOP 4X10 (GAUZE/BANDAGES/DRESSINGS) IMPLANT
DRSG OPSITE POSTOP 4X6 (GAUZE/BANDAGES/DRESSINGS) IMPLANT
ELECT REM PT RETURN 9FT ADLT (ELECTROSURGICAL) ×1
ELECTRODE REM PT RTRN 9FT ADLT (ELECTROSURGICAL) ×2 IMPLANT
EVACUATOR 1/8 PVC DRAIN (DRAIN) IMPLANT
EVACUATOR SILICONE 100CC (DRAIN) IMPLANT
FEE COVERAGE SUPPORT O-ARM (MISCELLANEOUS) ×1 IMPLANT
FORCEPS BIPOLAR SPETZLER 8 1.0 (NEUROSURGERY SUPPLIES) IMPLANT
GAUZE 4X4 16PLY ~~LOC~~+RFID DBL (SPONGE) IMPLANT
GAUZE SPONGE 4X4 12PLY STRL (GAUZE/BANDAGES/DRESSINGS) IMPLANT
GLOVE BIO SURGEON STRL SZ 6.5 (GLOVE) ×1 IMPLANT
GLOVE BIO SURGEON STRL SZ8 (GLOVE) ×2 IMPLANT
GLOVE BIO SURGEON STRL SZ8.5 (GLOVE) ×2 IMPLANT
GLOVE BIOGEL PI IND STRL 6.5 (GLOVE) ×1 IMPLANT
GLOVE EXAM NITRILE XL STR (GLOVE) IMPLANT
GOWN STRL REUS W/ TWL LRG LVL3 (GOWN DISPOSABLE) IMPLANT
GOWN STRL REUS W/ TWL XL LVL3 (GOWN DISPOSABLE) ×1 IMPLANT
HEMOSTAT POWDER KIT SURGIFOAM (HEMOSTASIS) ×1 IMPLANT
HEMOSTAT SURGICEL 2X14 (HEMOSTASIS) ×1 IMPLANT
IV NS 1000ML BAXH (IV SOLUTION) IMPLANT
KIT BASIN OR (CUSTOM PROCEDURE TRAY) ×1 IMPLANT
KIT DRAIN CSF ACCUDRAIN (MISCELLANEOUS) IMPLANT
KIT TURNOVER KIT B (KITS) ×1 IMPLANT
MARKER SKIN DUAL TIP RULER LAB (MISCELLANEOUS) IMPLANT
MARKER SPHERE PSV REFLC NDI (MISCELLANEOUS) IMPLANT
MESH DYNAMIC STD MED 1.7X90 (Mesh General) IMPLANT
NDL HYPO 22X1.5 SAFETY MO (MISCELLANEOUS) ×1 IMPLANT
NEEDLE HYPO 22X1.5 SAFETY MO (MISCELLANEOUS) ×1 IMPLANT
NS IRRIG 1000ML POUR BTL (IV SOLUTION) ×1 IMPLANT
PACK BATTERY CMF DISP FOR DVR (ORTHOPEDIC DISPOSABLE SUPPLIES) ×1 IMPLANT
PACK CRANIOTOMY CUSTOM (CUSTOM PROCEDURE TRAY) ×1 IMPLANT
PAD ARMBOARD 7.5X6 YLW CONV (MISCELLANEOUS) ×1 IMPLANT
PATTIES SURGICAL .25X.25 (GAUZE/BANDAGES/DRESSINGS) IMPLANT
PATTIES SURGICAL .5 X.5 (GAUZE/BANDAGES/DRESSINGS) IMPLANT
PATTIES SURGICAL .5 X3 (DISPOSABLE) IMPLANT
PATTIES SURGICAL 1X1 (DISPOSABLE) IMPLANT
PIN MAYFIELD SKULL DISP (PIN) ×1 IMPLANT
PLATE CRANIAL 12 2H RIGID UNI (Plate) IMPLANT
SCREW UNIII AXS SD 1.5X4 (Screw) IMPLANT
SPECIMEN JAR SMALL (MISCELLANEOUS) IMPLANT
SPONGE NEURO XRAY DETECT 1X3 (DISPOSABLE) IMPLANT
SPONGE SURGIFOAM ABS GEL 100 (HEMOSTASIS) ×1 IMPLANT
STAPLER SKIN PROX WIDE 3.9 (STAPLE) ×1 IMPLANT
STOCKINETTE 6 STRL (DRAPES) IMPLANT
SUT ETHILON 3 0 FSL (SUTURE) IMPLANT
SUT ETHILON 3 0 PS 1 (SUTURE) IMPLANT
SUT NURALON 4 0 TR CR/8 (SUTURE) ×2 IMPLANT
SUT PROLENE 6 0 BV (SUTURE) IMPLANT
SUT SILK 0 TIES 10X30 (SUTURE) IMPLANT
SUT VIC AB 2-0 CP2 18 (SUTURE) ×2 IMPLANT
SUT VIC AB 3-0 FS2 27 (SUTURE) IMPLANT
SUT VICRYL 4-0 PS2 18IN ABS (SUTURE) IMPLANT
TIP TISSUE SONOPET IQ STD 12 (TIP) IMPLANT
TOWEL GREEN STERILE (TOWEL DISPOSABLE) ×1 IMPLANT
TOWEL GREEN STERILE FF (TOWEL DISPOSABLE) ×1 IMPLANT
TRAY FOLEY MTR SLVR 16FR STAT (SET/KITS/TRAYS/PACK) ×1 IMPLANT
TUBE CONNECTING 12X1/4 (SUCTIONS) IMPLANT
TUBING FEATHERFLOW (TUBING) IMPLANT
UNDERPAD 30X36 HEAVY ABSORB (UNDERPADS AND DIAPERS) IMPLANT
WATER STERILE IRR 1000ML POUR (IV SOLUTION) ×1 IMPLANT

## 2023-01-09 NOTE — Op Note (Signed)
Brief history: The patient is an 82 year old black female with a history of multiple intracranial meningiomas.  She is already had a craniotomy and resection of tumor.  We have been following other meningiomas over the years with MRIs.  Her left frontal lesion has progressively gotten bigger.  We discussed the various treatment options.  She has decided proceed with surgery.  Preop diagnosis: Left frontal brain tumor  Postop diagnosis: The same  Procedure: Left frontal craniotomy for gross total resection of meningioma using microdissection and cranial navigation; cranioplasty  Surgeon: Dr. Delma Officer  Assistant: Dr. Lisbeth Renshaw and Hildred Priest, NP  Anesthesia: General Tracheal  Estimated blood loss: 200 cc  Specimens: Tumor  Complications: None  Drains: Subgaleal Jackson-Pratt drain  Description of procedure: The patient was brought to the operating room by the anesthesia team.  General endotracheal anesthesia was induced.  The patient remained in the supine position.  I applied the Mayfield 3 point headrest to her calvarium.  A roll was placed on her left shoulder.  Her head was turned to the right exposing her left scalp.  We then entered the surface coordinates into the Stealth computer.  The patient's left scalp was then shaved with clippers and prepared with Betadine scrub and Betadine solution.  Sterile drapes were applied.  I then injected there to be sites with lidocaine with epinephrine solution.  I used a scalpel to make incision partially in her old craniotomy incision and curving to the midline.  We used Raney clips for wound edge hemostasis.  I then used the periosteal elevators and electrocautery to expose the underlying calvarium.  We inserted the cerebellar retractor for exposure.  We encountered one of her old plates from her previous craniotomy.  We removed it as the old craniotomy flap had fused.  We then confirmed the position of the tumor using the Stealth  neuronavigation.  I then used the high-speed drill to create a left frontal burr hole.  We then used the footplate device to treat a craniotomy flap..  The patient's skull was quite thick and the underlying dura was somewhat adherent.  We elevated the craniotomy flap with the Penfield #1 and the periosteal elevator.  The cephalad aspect of the craniotomy flap was hyperostotic the underlying tumor.  We resected this part of the calvarium from the craniotomy flap and did not replace it at the end of the surgery.  I then used a 15 blade scalpel and the Metzenbaum scissors to create a durotomy.  We countered the tumor as expected.  It appeared to be a typical meningioma.  We resected the dura rounding the tumor.  We brought the operative microscope into the field under its magnification and illumination completed the tumor resection/microdissection.  We internally debulked the tumor using suction and the sono PET.  This gave [place] room to work around the edges of the tumor dissected away from the brain.  We encountered a few involving this which we coagulated with bipolar cautery and ligated with a microscissors.  We attain multiple specimens for the pathologist.  We are able to get a gross total resection of the tumor.  We then lined the tumor resection cavity with Surgicel.  We irrigated the resection cavity out with saline solution.  Irrigant came back crystal-clear.  We then laid a large piece of Gelfoam over the resected dura defect we then replaced part of the patient's craniotomy flap, i.e. the part that was not involved prostatic/involved with tumor, with titanium mini plates  and screws.  We covered over the craniectomy defect with a titanium mesh and secured it with screws.  We then placed a 10 mm flat Jackson-Pratt drain in the subgaleal space.  We removed the retractor.  We then reapproximated the patient's temporalis fascia and galea with interrupted 2-0 Vicryl suture.  We reapproximated the patient's skin  with stainless steel staples.  The wound was then coated with bacitracin ointment.  A sterile dressing was applied.  The drapes were removed.  I then removed the Mayfield 3 point headrest from the patient's calvarium.  By report all sponge, instrument, and needle counts were correct at the end of this case.

## 2023-01-09 NOTE — H&P (Signed)
Subjective: The patient is a 82 year old black female who has a history of multiple intracranial meningiomas.  She has had a previous craniotomy.  We have been following her tumors with serial MRIs.  Her left frontal lesion has been progressively larger.  We discussed the various treatment options with her.  She has decided proceed with surgery.  Past Medical History:  Diagnosis Date   Allergy    Anxiety    Arthritis    Asthma    patient denies    Cataract    bilateral lens implants    Colon polyps    Depression    Diabetes mellitus    Type two   Diverticulitis    Double vision 2019   left eye   Family history of adverse reaction to anesthesia    daughter- N/V    Family history of ovarian cancer    Family history of pancreatic cancer    Frequency of urination    GERD (gastroesophageal reflux disease)    History of adverse reaction to anesthesia 03/19/2022   After regional block for TKA pt states she had uncontrollable frequent blinking and "zoned out" was unable to speak more than one or two words for about 10 minutes   History of hiatal hernia    Hyperglycemia    Hyperlipidemia    Hypertension    Hyperthyroidism    PT HAD BIPOSY -NEGATIVE.Marland Kitchen AND NOW JUST GETS CHECKED EACH YEAR .Marland Kitchen 2013 LAST TEST   SEES DR. [name].    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 DOES NOT USE C PAP   Palpitations    Peptic ulcer disease    Pneumonia    "walking pneumonia" hx of   Seizures (HCC) 2015   one siezure with brain surgery none since 2015    Sleep apnea    no CPAP   Urinary tract infection 03/2020   Vertigo     Past Surgical History:  Procedure Laterality Date   ABDOMINAL HYSTERECTOMY     ANTERIOR CERVICAL DECOMP/DISCECTOMY FUSION N/A 04/19/2021   Procedure: Anterior Cervical Discectomy and Fusion with Interbody Prosthesis, Plates/Screws Cervical Three-Four/Cervical Four-Five REMoval CERVICAL PLATE;  Surgeon: Tressie Stalker, MD;   Location: [facility] OR;  Service: Neurosurgery;  Laterality: N/A;  3C   BACK SURGERY     L SPIN 2003   NECK 2004   BRAIN MENINGIOMA EXCISION  08/2008   X 2   C5-C6 neck fusion     CATARACT EXTRACTION Bilateral    COLONOSCOPY  05/05/2009   pt states multiple polyps removed in both colonoscopies she has had   CRANIOTOMY  08/13/2011   Procedure: CRANIOTOMY TUMOR EXCISION;  Surgeon: Cristi Loron, MD;  Location: [facility];  Service: Neurosurgery;  Laterality: Bilateral;  Bifrontal craniotomy for tumor   DILATION AND CURETTAGE OF UTERUS     YEARS AGO   L4-L5 posterior la  2021   L3-L5 fusion   left foot plantar and hammertoe     right foot bunectomy     right knee arthroscopy     x 3   right shoulder rotator cuff     ROBOTIC ASSISTED BILATERAL SALPINGO OOPHERECTOMY N/A 08/25/2015   Procedure: XI ROBOTIC ASSISTED BILATERAL SALPINGO OOPHORECTOMY;  Surgeon: Laurette Schimke, MD;  Location: [facility] ORS;  Service: Gynecology;  Laterality: N/A;   TONSILLECTOMY     TOTAL KNEE ARTHROPLASTY Right 03/19/2022   Procedure: TOTAL KNEE ARTHROPLASTY;  Surgeon: Ollen Gross, MD;  Location: [facility] ORS;  Service: Orthopedics;  Laterality: Right;   TUBAL LIGATION     WRIST SURGERY Right     Allergies  Allergen Reactions   Flagyl [Metronidazole] Other (See Comments)    Caused increased heart rate   Aspirin Effervescent Swelling    Alka-Seltzer gel caps- sore mouth, face swollen, turned hands white   Bupivacaine Other (See Comments)    Eye Batting really fast, Could not speak normally, Words were long   Morphine Sulfate Hives    Hallucinations   Penicillins Swelling and Rash    Previously tolerated cephalexin    Social History   Tobacco Use   Smoking status: Never   Smokeless tobacco: Never  Substance Use Topics   Alcohol use: No    Family History  Problem Relation Age of Onset   Ovarian cancer Mother    Heart disease Father 24   Pancreatic cancer Maternal Aunt    Diabetes Maternal Grandmother     Anesthesia problems Daughter        NAUSEA AND VOMITING POST OP   Arthritis Other    Hyperlipidemia Other    Hypertension Other    Diabetes Other    Breast cancer Neg Hx    Colon cancer Neg Hx    Colon polyps Neg Hx    Esophageal cancer Neg Hx    Stomach cancer Neg Hx    Rectal cancer Neg Hx    Prior to Admission medications   Medication Sig Start Date End Date Taking? Authorizing Provider  ACCU-CHEK AVIVA PLUS test strip TEST BLOOD SUGAR TWICE DAILY. 12/12/22  Yes Burchette, Elberta Fortis, MD  acetaminophen (TYLENOL) 650 MG CR tablet Take 1,300 mg by mouth every morning.   Yes [provider]  amLODipine (NORVASC) 5 MG tablet TAKE ONE (1) TABLET EACH DAY 08/06/22  Yes Burchette, Elberta Fortis, MD  ascorbic acid (VITAMIN C) 500 MG tablet Take 500 mg by mouth daily.   Yes [provider]  cetirizine (ZYRTEC) 10 MG tablet Take 10 mg by mouth daily as needed for allergies.    Yes [provider]  Flaxseed, Linseed, (FLAX SEED OIL) 1000 MG CAPS Take 1,000 mg by mouth daily.   Yes [provider]  Iron-Vitamins (GERITOL COMPLETE PO) Take 1 tablet by mouth daily.   Yes [provider]  levothyroxine (SYNTHROID) 100 MCG tablet Take 1 tablet (100 mcg total) by mouth daily. Patient taking differently: Take 75 mcg by mouth daily. 08/10/22  Yes Reather Littler, MD  losartan-hydrochlorothiazide (HYZAAR) 100-12.5 MG tablet TAKE ONE (1) TABLET EACH DAY Patient taking differently: Take 0.5 tablets by mouth daily. 08/22/22  Yes Burchette, Elberta Fortis, MD  Magnesium 250 MG TABS Take 250 mg by mouth daily.   Yes [provider]  meclizine (ANTIVERT) 25 MG tablet Take 25 mg by mouth 3 (three) times daily as needed for dizziness.    Yes [provider]  metoprolol succinate (TOPROL-XL) 50 MG 24 hr tablet TAKE ONE (1) TABLET BY MOUTH EVERY DAY 11/19/22  Yes Burchette, Elberta Fortis, MD  potassium chloride (KLOR-CON) 10 MEQ tablet TAKE ONE (1) TABLET EACH DAY 12/25/22  Yes  Burchette, Elberta Fortis, MD  Propylene Glycol (SYSTANE BALANCE) 0.6 % SOLN Place 1 drop into both eyes daily as needed (dry eyes).   Yes [provider]  RABEprazole (ACIPHEX) 20 MG tablet TAKE ONE (1) TABLET EACH DAY Patient taking differently: Take 20 mg by mouth daily. 10/17/22  Yes  Burchette, Elberta Fortis, MD  rosuvastatin (CRESTOR) 40 MG tablet Take 40 mg by mouth daily.   Yes [provider]  SitaGLIPtin-MetFORMIN HCl (JANUMET XR) 50-500 MG TB24 TAKE ONE (1) TABLET EACH DAY 07/30/22  Yes Reather Littler, MD  solifenacin (VESICARE) 5 MG tablet TAKE ONE (1) TABLET BY MOUTH EVERY DAY 10/17/22  Yes Burchette, Elberta Fortis, MD  valACYclovir (VALTREX) 1000 MG tablet TAKE 2 TABS TWICE DAILY FOR 1 DAY THEN AS NEEDED Patient taking differently: Take 500-1,000 mg by mouth daily as needed (cold sores). 04/11/20  Yes Burchette, Elberta Fortis, MD  Accu-Chek Softclix Lancets lancets TEST BLOOD SUGAR TWICE DAILY. 08/29/21   Burchette, Elberta Fortis, MD  Lancet Devices [facility]) lancets 1 each by Other route 2 (two) times daily. E11.9 03/31/15   Burchette, Elberta Fortis, MD  venlafaxine (EFFEXOR XR) 37.5 MG 24 hr capsule Take 1 capsule (37.5 mg total) by mouth daily. 02/15/11 04/11/11  Burchette, Elberta Fortis, MD     Review of Systems  Positive ROS: As above  All other systems have been reviewed and were otherwise negative with the exception of those mentioned in the HPI and as above.  Objective: Vital signs in last 24 hours: Temp:  [97.9 F (36.6 C)] 97.9 F (36.6 C) (12/18 0846) Pulse Rate:  [80] 80 (12/18 0846) Resp:  [18] 18 (12/18 0846) BP: (150)/(74) 150/74 (12/18 0846) SpO2:  [99 %] 99 % (12/18 0846) Weight:  [108.9 kg] 108.9 kg (12/18 0846) Estimated body mass index is 41.2 kg/m as calculated from the following:   Height as of this encounter: 5\' 4"  (1.626 m).   Weight as of this encounter: 108.9 kg.   General Appearance: Alert Head: Normocephalic, without obvious abnormality, atraumatic Eyes: PERRL,  conjunctiva/corneas clear, EOM's intact,    Ears: Normal  Throat: Normal  Neck: Her incision is well-healed. Back: Her incision is well-healed. Lungs: Clear to auscultation bilaterally, respirations unlabored Heart: Regular rate and rhythm, no murmur, rub or gallop Abdomen: Soft, non-tender Extremities: Extremities normal, atraumatic, no cyanosis or edema Skin: unremarkable  NEUROLOGIC:   Mental status: alert and oriented,Motor Exam - grossly normal Sensory Exam - grossly normal Reflexes:  Coordination - grossly normal Gait - grossly normal Balance - grossly normal Cranial Nerves: I: smell Not tested  II: visual acuity  OS: Normal  OD: Normal   II: visual fields Full to confrontation  II: pupils Equal, round, reactive to light  III,VII: ptosis None  III,IV,VI: extraocular muscles  Full ROM  V: mastication Normal  V: facial light touch sensation  Normal  V,VII: corneal reflex  Present  VII: facial muscle function - upper  Normal  VII: facial muscle function - lower Normal  VIII: hearing Not tested  IX: soft palate elevation  Normal  IX,X: gag reflex Present  XI: trapezius strength  5/5  XI: sternocleidomastoid strength 5/5  XI: neck flexion strength  5/5  XII: tongue strength  Normal    Data Review Lab Results  Component Value Date   WBC 4.8 01/03/2023   HGB 12.1 01/03/2023   HCT 37.6 01/03/2023   MCV 80.9 01/03/2023   PLT 184 01/03/2023   Lab Results  Component Value Date   NA 134 (L) 01/03/2023   K 4.1 01/03/2023   CL 100 01/03/2023   CO2 24 01/03/2023   BUN 21 01/03/2023   CREATININE 1.19 (H) 01/03/2023   GLUCOSE 108 (H) 01/03/2023   Lab Results  Component Value Date   INR 1.01 08/19/2011  Assessment/Plan: Left frontal meningioma: I have discussed the situation with the patient.  I reviewed her MRI scans with her and pointed out the abnormalities.  We have discussed the various treatment options including surgery.  I described the surgical  treatment option of a left frontal craniotomy for resection of her left frontal meningioma.  We have discussed the risk, benefits, alternatives, expected postop course, and likelihood of achieving our goals with surgery.  I have answered all the patient's questions.  She has decided proceed with surgery.   Cristi Loron 01/09/2023 10:37 AM

## 2023-01-09 NOTE — Anesthesia Postprocedure Evaluation (Signed)
Anesthesia Post Note  Patient: Dawn Salas  Procedure(s) Performed: FRONTAL CRANIOTOMY (Left) APPLICATION OF CRANIAL NAVIGATION (Left)     Patient location during evaluation: PACU Anesthesia Type: General Level of consciousness: awake and alert Pain management: pain level controlled Vital Signs Assessment: post-procedure vital signs reviewed and stable Respiratory status: spontaneous breathing, nonlabored ventilation, respiratory function stable and patient connected to nasal cannula oxygen Cardiovascular status: blood pressure returned to baseline and stable Postop Assessment: no apparent nausea or vomiting Anesthetic complications: no  No notable events documented.  Last Vitals:  Vitals:   01/09/23 1545 01/09/23 1600  BP:    Pulse: 84   Resp: 15   Temp:  36.6 C  SpO2: 98%     Last Pain:  Vitals:   01/09/23 1600  TempSrc: Oral  PainSc:    Pain Goal:                   Brenyn Petrey L Aeva Posey

## 2023-01-09 NOTE — Transfer of Care (Signed)
Immediate Anesthesia Transfer of Care Note  Patient: Dawn Salas  Procedure(s) Performed: FRONTAL CRANIOTOMY (Left) APPLICATION OF CRANIAL NAVIGATION (Left)  Patient Location: PACU  Anesthesia Type:General  Level of Consciousness: awake, alert , and oriented  Airway & Oxygen Therapy: Patient Spontanous Breathing and Patient connected to nasal cannula oxygen  Post-op Assessment: Report given to RN and Post -op Vital signs reviewed and stable  Post vital signs: Reviewed and stable  Last Vitals:  Vitals Value Taken Time  BP 179/81 01/09/23 1340  Temp 97.4   Pulse 81 01/09/23 1341  Resp 20 01/09/23 1341  SpO2 100 % 01/09/23 1341  Vitals shown include unfiled device data.  Last Pain:  Vitals:   01/09/23 0941  TempSrc:   PainSc: 0-No pain         Complications: No notable events documented.

## 2023-01-09 NOTE — Consult Note (Signed)
NAME:  Dawn Salas, MRN:  [id number], DOB:  02-19-1940, LOS: 0 ADMISSION DATE:  01/09/2023 CONSULTATION DATE:  01/09/2023 REFERRING MD:  Lovell Sheehan - NSGY CHIEF COMPLAINT:  S/p craniotomy   History of Present Illness:  82 year old woman who presented to [facility] 12/18 for scheduled craniotomy for L frontal meningioma resection. PMHx significant for HTN, HLD, OSA, T2DM, multiple intracranial meningiomas (s/p prior craniotomy), PUD/GERD, diverticulitis, anxiety/depression.  Ms. Shipps underwent scheduled L frontal craniotomy (Dr. Lovell Sheehan) 12/18. Intraoperative course was uncomplicated, EBL .Patient was transferred to 4N Neuro ICU postoperatively, extubated and in stable condition.  PCCM consulted for assistance with medical management.  Pertinent Medical History:   Past Medical History:  Diagnosis Date   Allergy    Anxiety    Arthritis    Asthma    patient denies    Cataract    bilateral lens implants    Colon polyps    Depression    Diabetes mellitus    Type two   Diverticulitis    Double vision 2019   left eye   Family history of adverse reaction to anesthesia    daughter- N/V    Family history of ovarian cancer    Family history of pancreatic cancer    Frequency of urination    GERD (gastroesophageal reflux disease)    History of adverse reaction to anesthesia 03/19/2022   After regional block for TKA pt states she had uncontrollable frequent blinking and "zoned out" was unable to speak more than one or two words for about 10 minutes   History of hiatal hernia    Hyperglycemia    Hyperlipidemia    Hypertension    Hyperthyroidism    PT HAD BIPOSY -NEGATIVE.Marland Kitchen AND NOW JUST GETS CHECKED EACH YEAR .Marland Kitchen 2013 LAST TEST   SEES DR. [name].    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 DOES NOT USE C PAP   Palpitations    Peptic ulcer disease    Pneumonia    "walking pneumonia" hx of   Seizures (HCC) 2015   one siezure with  brain surgery none since 2015    Sleep apnea    no CPAP   Urinary tract infection 03/2020   Vertigo    Significant Hospital Events: Including procedures, antibiotic start and stop dates in addition to other pertinent events   12/18 - Presented to [facility] for scheduled L frontal craniotomy for enlarging L frontal meningioma. Taken to OR, procedure uncomplicated. Transferred to 4N postoperatively. PCCM consulted for medical management.  Interim History / Subjective:  PCCM consulted for medical management.  Objective:  Blood pressure (!) 128/55, pulse 84, temperature 97.8 F (36.6 C), temperature source Oral, resp. rate 17, height 5\' 4"  (1.626 m), weight 108.9 kg, SpO2 93%.        Intake/Output Summary (Last 24 hours) at 01/09/2023 1937 Last data filed at 01/09/2023 1900 Gross per 24 hour  Intake 1195.22 ml  Output 985 ml  Net 210.22 ml   Filed Weights   01/09/23 0846  Weight: 108.9 kg   Physical Examination: General: Overall well-appearing elderly woman in NAD. HEENT: S/p L frontal craniotomy with honeycomb dressing in place, L frontal JP with serosanguinous output, anicteric sclera, PERRL, moist mucous membranes. Neuro: Awake, oriented x 4. Responds to verbal stimuli. Following commands consistently. Moves all 4 extremities spontaneously. No focal neurologic deficits.  CV: RRR, no m/g/r. PULM: Breathing even and unlabored on RA.  Lung fields CTAB. GI: Soft, nontender, nondistended. Normoactive bowel sounds. Extremities: No LE edema noted. Skin: Warm/dry, no rashes.  Resolved Hospital Problem List:    Assessment & Plan:  L frontal meningioma S/p L frontal craniotomy 12/18 History of multiple meningiomas - NSGY team primary - Postoperative management per NSGY - Goal SBP 130-150 - Cleviprex titrated to goal SBP, wean off as able - Keppra for seizure ppx - Monitor JP drain output - PT/OT/SLP as indicated - Neuroprotective measures: HOB > 30 degrees, normoglycemia,  normothermia, electrolytes WNL  HTN HLD - Resume home Norvasc (12/19), Toprol-XL, Hyzaar (in stepwise fashion), ensure hemodynamic stability postoperatively prior to resumption of all medications - Resume statin - Cardiac monitoring/telemetry  OSA Not on CPAP at home. - Supplemental O2 support as needed - Wean O2 for sat > 90% - Pulmonary hygiene  T2DM - SSI - CBGs ACHS - Goal CBG 140-180 - Hold home  Hypothyroidism - Resume home Synthroid  PUD/GERD Diverticulitis - PPI - Bowel regimen postoperatively  Anxiety/depression - Not on medications for this at home  Best Practice: (right click and "Reselect all SmartList Selections" daily)   Diet/type: Regular consistency (see orders) DVT prophylaxis: SCDs GI prophylaxis: PPI Lines: N/A Foley:  Yes, and it is still needed, consider removal 12/19 Code Status:  full code Last date of multidisciplinary goals of care discussion [Per Primary Team]  Labs:  CBC: Recent Labs  Lab 01/03/23 1356  WBC 4.8  HGB 12.1  HCT 37.6  MCV 80.9  PLT 184   Basic Metabolic Panel: Recent Labs  Lab 01/03/23 1356  NA 134*  K 4.1  CL 100  CO2 24  GLUCOSE 108*  BUN 21  CREATININE 1.19*  CALCIUM 8.8*   GFR: Estimated Creatinine Clearance: 45.5 mL/min (A) (by C-G formula based on SCr of 1.19 mg/dL (H)). Recent Labs  Lab 01/03/23 1356  WBC 4.8   Liver Function Tests: No results for input(s): "AST", "ALT", "ALKPHOS", "BILITOT", "PROT", "ALBUMIN" in the last 168 hours. No results for input(s): "LIPASE", "AMYLASE" in the last 168 hours. No results for input(s): "AMMONIA" in the last 168 hours.  ABG: No results found for: "PHART", "PCO2ART", "PO2ART", "HCO3", "TCO2", "ACIDBASEDEF", "O2SAT"   Coagulation Profile: No results for input(s): "INR", "PROTIME" in the last 168 hours.  Cardiac Enzymes: No results for input(s): "CKTOTAL", "CKMB", "CKMBINDEX", "TROPONINI" in the last 168 hours.  HbA1C: Hemoglobin A1C  Date/Time  Value Ref Range Status  10/24/2022 11:38 AM 6.3 (A) 4.0 - 5.6 % Final  08/09/2022 11:19 AM 6.3 (A) 4.0 - 5.6 % Final   Hgb A1c MFr Bld  Date/Time Value Ref Range Status  01/03/2023 01:56 PM 6.6 (H) 4.8 - 5.6 % Final    Comment:    (NOTE) Pre diabetes:          5.7%-6.4%  Diabetes:              >6.4%  Glycemic control for   <7.0% adults with diabetes   03/06/2022 11:28 AM 5.9 (H) 4.8 - 5.6 % Final    Comment:    (NOTE) Pre diabetes:          5.7%-6.4%  Diabetes:              >6.4%  Glycemic control for   <7.0% adults with diabetes    CBG: Recent Labs  Lab 01/03/23 1336 01/09/23 0853 01/09/23 1403 01/09/23 1554 01/09/23 1910  GLUCAP 102* 106* 104* 168* 171*   Review of Systems:  Review of Systems  Constitutional:  Negative for chills, fever and malaise/fatigue.  HENT:  Negative for congestion and sore throat.   Eyes:  Negative for blurred vision, double vision and photophobia.  Respiratory:  Negative for cough and shortness of breath.   Cardiovascular:  Negative for chest pain, palpitations and leg swelling.  Gastrointestinal:  Negative for abdominal pain, diarrhea, nausea and vomiting.  Genitourinary:  Negative for dysuria.  Musculoskeletal:  Negative for myalgias.  Neurological:  Positive for headaches. Negative for dizziness and weakness.   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   Surgical History:   Past Surgical History:  Procedure Laterality Date   ABDOMINAL HYSTERECTOMY     ANTERIOR CERVICAL DECOMP/DISCECTOMY FUSION N/A 04/19/2021   Procedure: Anterior Cervical Discectomy and Fusion with Interbody Prosthesis, Plates/Screws Cervical Three-Four/Cervical Four-Five REMoval CERVICAL PLATE;  Surgeon: Tressie Stalker, MD;  Location: [facility] OR;  Service: Neurosurgery;  Laterality: N/A;  3C   BACK SURGERY     L SPIN 2003   NECK 2004   BRAIN MENINGIOMA EXCISION  08/2008   X 2   C5-C6 neck fusion     CATARACT EXTRACTION Bilateral    COLONOSCOPY  05/05/2009   pt states multiple polyps removed in both colonoscopies she has had   CRANIOTOMY  08/13/2011   Procedure: CRANIOTOMY TUMOR EXCISION;  Surgeon: Cristi Loron, MD;  Location: [facility] ORS;  Service: Neurosurgery;  Laterality: Bilateral;  Bifrontal craniotomy for tumor   DILATION AND CURETTAGE OF UTERUS     YEARS AGO   L4-L5 posterior la  2021   L3-L5 fusion   left foot plantar and hammertoe     right foot bunectomy     right knee arthroscopy     x 3   right shoulder rotator cuff     ROBOTIC ASSISTED BILATERAL SALPINGO OOPHERECTOMY N/A 08/25/2015   Procedure: XI ROBOTIC ASSISTED BILATERAL SALPINGO OOPHORECTOMY;  Surgeon: Laurette Schimke, MD;  Location: [facility] ORS;  Service: Gynecology;  Laterality: N/A;   TONSILLECTOMY     TOTAL KNEE ARTHROPLASTY Right 03/19/2022   Procedure: TOTAL KNEE ARTHROPLASTY;  Surgeon: Ollen Gross, MD;  Location: [facility] ORS;  Service: Orthopedics;  Laterality: Right;   TUBAL LIGATION     WRIST SURGERY Right    Social History:   reports that she has never smoked. She has never used smokeless tobacco. She reports that she does not drink alcohol and does not use drugs.   Family History:  Her family history includes Anesthesia problems in her daughter; Arthritis in an other family member; Diabetes in her  maternal grandmother and another family member; Heart disease (age of onset: 79) in her father; Hyperlipidemia in an other family member; Hypertension in an other family member; Ovarian cancer in her mother; Pancreatic cancer in her maternal aunt. There is no history of Breast cancer, Colon cancer, Colon polyps, Esophageal cancer, Stomach cancer, or Rectal cancer.   Allergies: Allergies  Allergen Reactions   Flagyl [Metronidazole] Other (See Comments)  Caused increased heart rate   Aspirin Effervescent Swelling    Alka-Seltzer gel caps- sore mouth, face swollen, turned hands white   Bupivacaine Other (See Comments)    Eye Batting really fast, Could not speak normally, Words were long   Morphine Sulfate Hives    Hallucinations   Penicillins Swelling and Rash    Previously tolerated cephalexin, cefazolin 03/19/22    Home Medications: Prior to Admission medications   Medication Sig Start Date End Date Taking? Authorizing Provider  ACCU-CHEK AVIVA PLUS test strip TEST BLOOD SUGAR TWICE DAILY. 12/12/22  Yes Burchette, Elberta Fortis, MD  acetaminophen (TYLENOL) 650 MG CR tablet Take 1,300 mg by mouth every morning.   Yes [provider]  amLODipine (NORVASC) 5 MG tablet TAKE ONE (1) TABLET EACH DAY 08/06/22  Yes Burchette, Elberta Fortis, MD  ascorbic acid (VITAMIN C) 500 MG tablet Take 500 mg by mouth daily.   Yes [provider]  cetirizine (ZYRTEC) 10 MG tablet Take 10 mg by mouth daily as needed for allergies.    Yes [provider]  Flaxseed, Linseed, (FLAX SEED OIL) 1000 MG CAPS Take 1,000 mg by mouth daily.   Yes [provider]  Iron-Vitamins (GERITOL COMPLETE PO) Take 1 tablet by mouth daily.   Yes [provider]  levothyroxine (SYNTHROID) 100 MCG tablet Take 1 tablet (100 mcg total) by mouth daily. Patient taking differently: Take 75 mcg by mouth daily. 08/10/22  Yes Reather Littler, MD  losartan-hydrochlorothiazide (HYZAAR) 100-12.5 MG tablet TAKE ONE (1)  TABLET EACH DAY Patient taking differently: Take 0.5 tablets by mouth daily. 08/22/22  Yes Burchette, Elberta Fortis, MD  Magnesium 250 MG TABS Take 250 mg by mouth daily.   Yes [provider]  meclizine (ANTIVERT) 25 MG tablet Take 25 mg by mouth 3 (three) times daily as needed for dizziness.    Yes [provider]  metoprolol succinate (TOPROL-XL) 50 MG 24 hr tablet TAKE ONE (1) TABLET BY MOUTH EVERY DAY 11/19/22  Yes Burchette, Elberta Fortis, MD  potassium chloride (KLOR-CON) 10 MEQ tablet TAKE ONE (1) TABLET EACH DAY 12/25/22  Yes Burchette, Elberta Fortis, MD  Propylene Glycol (SYSTANE BALANCE) 0.6 % SOLN Place 1 drop into both eyes daily as needed (dry eyes).   Yes [provider]  RABEprazole (ACIPHEX) 20 MG tablet TAKE ONE (1) TABLET EACH DAY Patient taking differently: Take 20 mg by mouth daily. 10/17/22  Yes Burchette, Elberta Fortis, MD  rosuvastatin (CRESTOR) 40 MG tablet Take 40 mg by mouth daily.   Yes [provider]  SitaGLIPtin-MetFORMIN HCl (JANUMET XR) 50-500 MG TB24 TAKE ONE (1) TABLET EACH DAY 07/30/22  Yes Reather Littler, MD  solifenacin (VESICARE) 5 MG tablet TAKE ONE (1) TABLET BY MOUTH EVERY DAY 10/17/22  Yes Burchette, Elberta Fortis, MD  valACYclovir (VALTREX) 1000 MG tablet TAKE 2 TABS TWICE DAILY FOR 1 DAY THEN AS NEEDED Patient taking differently: Take 500-1,000 mg by mouth daily as needed (cold sores). 04/11/20  Yes Burchette, Elberta Fortis, MD  Accu-Chek Softclix Lancets lancets TEST BLOOD SUGAR TWICE DAILY. 08/29/21   Burchette, Elberta Fortis, MD  Lancet Devices [facility]) lancets 1 each by Other route 2 (two) times daily. E11.9 03/31/15   Burchette, Elberta Fortis, MD  venlafaxine (EFFEXOR XR) 37.5 MG 24 hr capsule Take 1 capsule (37.5 mg total) by mouth daily. 02/15/11 04/11/11  Burchette, Elberta Fortis, MD    Signature:   Tim Lair, PA-C [facility] 01/09/23 7:37  PM  Please see [email or web address] for pager details.  From 7A-7P if no response, please call  [phone] After hours, please call ELink [phone]

## 2023-01-09 NOTE — Anesthesia Procedure Notes (Signed)
Procedure Name: Intubation Date/Time: 01/09/2023 10:52 AM  Performed by: Pincus Large, CRNAPre-anesthesia Checklist: Patient identified, Emergency Drugs available, Suction available and Patient being monitored Patient Re-evaluated:Patient Re-evaluated prior to induction Oxygen Delivery Method: Circle System Utilized Preoxygenation: Pre-oxygenation with 100% oxygen Induction Type: IV induction Ventilation: Mask ventilation without difficulty Laryngoscope Size: Glidescope and 3 Grade View: Grade I Tube type: Oral Tube size: 7.0 mm Number of attempts: 1 Airway Equipment and Method: Stylet and Oral airway Placement Confirmation: ETT inserted through vocal cords under direct vision, positive ETCO2 and breath sounds checked- equal and bilateral Secured at: 22 cm Tube secured with: Tape Dental Injury: Teeth and Oropharynx as per pre-operative assessment

## 2023-01-09 NOTE — Progress Notes (Signed)
Subjective: The patient is alert and pleasant.  She is in no apparent distress.  She looks well.  Objective: Vital signs in last 24 hours: Temp:  [97.1 F (36.2 C)-97.9 F (36.6 C)] 97.8 F (36.6 C) (12/18 1600) Pulse Rate:  [80-88] 83 (12/18 1645) Resp:  [14-24] 19 (12/18 1700) BP: (141-191)/(63-88) 141/81 (12/18 1530) SpO2:  [93 %-100 %] 94 % (12/18 1700) Arterial Line BP: (145-194)/(59-74) 145/59 (12/18 1700) Weight:  [108.9 kg] 108.9 kg (12/18 0846) Estimated body mass index is 41.2 kg/m as calculated from the following:   Height as of this encounter: 5\' 4"  (1.626 m).   Weight as of this encounter: 108.9 kg.   Intake/Output from previous day: No intake/output data recorded. Intake/Output this shift: Total I/O In: 1005.1 [I.V.:856.6; IV Piggyback:148.5] Out: 610 [Urine:475; Drains:95; Blood:40]  Physical exam the patient is alert and pleasant.  She is oriented.  She is moving all 4 extremities well.  Her speech is unremarkable.  Her dressing is clean and dry.  Lab Results: No results for input(s): "WBC", "HGB", "HCT", "PLT" in the last 72 hours. BMET No results for input(s): "NA", "K", "CL", "CO2", "GLUCOSE", "BUN", "CREATININE", "CALCIUM" in the last 72 hours.  Studies/Results: No results found.  Assessment/Plan: Status post craniotomy: The patient is doing well.  LOS: 0 days     Cristi Loron 01/09/2023, 5:29 PM

## 2023-01-10 ENCOUNTER — Encounter (HOSPITAL_COMMUNITY): Payer: Self-pay | Admitting: Neurosurgery

## 2023-01-10 LAB — GLUCOSE, CAPILLARY
Glucose-Capillary: 163 mg/dL — ABNORMAL HIGH (ref 70–99)
Glucose-Capillary: 161 mg/dL — ABNORMAL HIGH (ref 70–99)
Glucose-Capillary: 150 mg/dL — ABNORMAL HIGH (ref 70–99)
Glucose-Capillary: 176 mg/dL — ABNORMAL HIGH (ref 70–99)

## 2023-01-10 LAB — SURGICAL PATHOLOGY

## 2023-01-10 MED ORDER — LEVETIRACETAM 500 MG PO TABS
500.0000 mg | ORAL_TABLET | Freq: Two times a day (BID) | ORAL | Status: DC
Start: 2023-01-10 — End: 2023-01-11
  Administered 2023-01-10 – 2023-01-11 (×2): 500 mg via ORAL
  Filled 2023-01-10 (×2): qty 1

## 2023-01-10 MED ORDER — CHLORHEXIDINE GLUCONATE CLOTH 2 % EX PADS
6.0000 | MEDICATED_PAD | Freq: Every day | CUTANEOUS | Status: DC
  Administered 2023-01-10: 6 via TOPICAL

## 2023-01-10 NOTE — Progress Notes (Signed)
Pt Tx from [facility]. Pt A&Ox4. No skin issues ,Pt on tele NSR. Bed in lowest position, 2/4 rails up. Pt ambulates with cane with miniaml supervision. Pt is finger stick.  BP 133/82   Pulse 93   Temp 99 F (37.2 C) (Oral)   Resp 19   Ht 5\' 4"  (1.626 m)   Wt 108.9 kg   SpO2 99%   BMI 41.20 kg/m  Reva Bores 01/10/23.6:42 PM

## 2023-01-10 NOTE — Progress Notes (Signed)
Subjective: The patient is alert and pleasant.  She looks well.  Objective: Vital signs in last 24 hours: Temp:  [97.1 F (36.2 C)-98.8 F (37.1 C)] 98.6 F (37 C) (12/19 0800) Pulse Rate:  [80-93] 91 (12/19 1000) Resp:  [13-26] 19 (12/19 1000) BP: (98-191)/(55-88) 128/55 (12/18 1800) SpO2:  [88 %-100 %] 98 % (12/19 1000) Arterial Line BP: (111-194)/(48-74) 144/58 (12/19 1000) Estimated body mass index is 41.2 kg/m as calculated from the following:   Height as of this encounter: 5\' 4"  (1.626 m).   Weight as of this encounter: 108.9 kg.   Intake/Output from previous day: 12/18 0701 - 12/19 0700 In: 1263.8 [I.V.:958.6; IV Piggyback:305.2] Out: 1585 [Urine:1225; Drains:320; Blood:40] Intake/Output this shift: Total I/O In: 338.4 [I.V.:238.4; IV Piggyback:100] Out: 50 [Drains:50]  Physical exam the patient is alert and oriented.  Her speech and strength is normal.  Her dressing is clean and dry.  I removed the drain.  Lab Results: No results for input(s): "WBC", "HGB", "HCT", "PLT" in the last 72 hours. BMET No results for input(s): "NA", "K", "CL", "CO2", "GLUCOSE", "BUN", "CREATININE", "CALCIUM" in the last 72 hours.  Studies/Results: No results found.  Assessment/Plan: Postop day #1: The patient is doing well.  We will mobilize her and transfer to the progressive unit.  She will likely go home tomorrow.  We will ask PT and OT to see her.  LOS: 1 day     Cristi Loron 01/10/2023, 10:59 AM

## 2023-01-10 NOTE — Evaluation (Signed)
Physical Therapy Evaluation Patient Details Name: Dawn Salas MRN: [id number] DOB: 1940-06-05 Today's Date: 01/10/2023  History of Present Illness  Pt is a 82 y.o. F who presents 01/09/2023 with history of multiple intracranial meningiomas now s/p left frontal craniotomy. Significant PMH: craniotomy (2013), ACDF, lumbar fusion, R TKA.  Clinical Impression  PTA, pt lives with her family and is independent with ADL's and mobility using a cane. Pt appears to be close to her functional baseline. Reports good pain control overall. Recently woke up from a nap and has mild word finding difficulties, but overall A&O x 4 and answers questions appropriately. Pt ambulating 250 ft with a cane and negotiated 4 steps without physical difficulty. BP initially soft upon return to room sitting in recliner 114/45 (58), but increased to 142/71 (92) after 2 minutes. No further acute PT or follow up needs recommended. Thank you for this consult.      If plan is discharge home, recommend the following: Assistance with cooking/housework;Assist for transportation;Help with stairs or ramp for entrance   Can travel by private vehicle        Equipment Recommendations None recommended by PT  Recommendations for Other Services       Functional Status Assessment Patient has had a recent decline in their functional status and demonstrates the ability to make significant improvements in function in a reasonable and predictable amount of time.     Precautions / Restrictions Precautions Precautions: Fall Restrictions Weight Bearing Restrictions Per Provider Order: No      Mobility  Bed Mobility Overal bed mobility: Modified Independent                  Transfers Overall transfer level: Modified independent Equipment used: Straight cane                    Ambulation/Gait Ambulation/Gait assistance: Supervision Gait Distance (Feet): 250 Feet Assistive device: Straight cane Gait  Pattern/deviations: Step-through pattern, Decreased stride length       General Gait Details: Mild dynamic imbalance, uses cane for balance, supervision for line management  Stairs Stairs: Yes Stairs assistance: Supervision Stair Management: One rail Left Number of Stairs: 4    Wheelchair Mobility     Tilt Bed    Modified Rankin (Stroke Patients Only)       Balance Overall balance assessment: Mild deficits observed, not formally tested                                           Pertinent Vitals/Pain Pain Assessment Pain Assessment: 0-10 Pain Score: 2  Pain Location: headache Pain Descriptors / Indicators: Headache Pain Intervention(s): Monitored during session    Home Living Family/patient expects to be discharged to:: Private residence Living Arrangements: Spouse/significant other;Children (husband and 2 children) Available Help at Discharge: Family;Available 24 hours/day Type of Home: House Home Access: Stairs to enter   Entrance Stairs-Number of Steps: 1 +1 Alternate Level Stairs-Number of Steps: 13 Home Layout: Two level;Able to live on main level with bedroom/bathroom Home Equipment: Cane - single point;Hand held shower head;Grab bars - tub/shower;Rolling Walker (2 wheels);Shower seat - built in      Prior Function Prior Level of Function : Independent/Modified Independent;Driving             Mobility Comments: cane, reports fall in May where she fell off a chair on porch ADLs Comments:  independent ADL's/IADL's     Extremity/Trunk Assessment   Upper Extremity Assessment Upper Extremity Assessment: Defer to OT evaluation    Lower Extremity Assessment Lower Extremity Assessment: RLE deficits/detail;LLE deficits/detail RLE Deficits / Details: Increased edema distally, which pt states is baseline LLE Deficits / Details: Increased edema distally, which pt states is baseline       Communication   Communication Communication:  Other (comment) (mild word finding difficulties)  Cognition Arousal: Alert Behavior During Therapy: WFL for tasks assessed/performed Overall Cognitive Status: Within Functional Limits for tasks assessed                                          General Comments      Exercises     Assessment/Plan    PT Assessment Patient does not need any further PT services  PT Problem List         PT Treatment Interventions      PT Goals (Current goals can be found in the Care Plan section)  Acute Rehab PT Goals Patient Stated Goal: go home tomorrow PT Goal Formulation: All assessment and education complete, DC therapy    Frequency       Co-evaluation               AM-PAC PT "6 Clicks" Mobility  Outcome Measure Help needed turning from your back to your side while in a flat bed without using bedrails?: None Help needed moving from lying on your back to sitting on the side of a flat bed without using bedrails?: None Help needed moving to and from a bed to a chair (including a wheelchair)?: None Help needed standing up from a chair using your arms (e.g., wheelchair or bedside chair)?: None Help needed to walk in hospital room?: A Little Help needed climbing 3-5 steps with a railing? : A Little 6 Click Score: 22    End of Session Equipment Utilized During Treatment: Gait belt Activity Tolerance: Patient tolerated treatment well Patient left: in chair;with call bell/phone within reach;with chair alarm set Nurse Communication: Mobility status PT Visit Diagnosis: Unsteadiness on feet (R26.81)    Time: 9811-9147 PT Time Calculation (min) (ACUTE ONLY): 28 min   Charges:   PT Evaluation $PT Eval Low Complexity: 1 Low PT Treatments $Therapeutic Activity: 8-22 mins PT General Charges $$ ACUTE PT VISIT: 1 Visit         Dawn Salas, PT, DPT Acute Rehabilitation Services Office [phone]   Dawn Salas 01/10/2023, 3:40 PM

## 2023-01-10 NOTE — Evaluation (Signed)
Occupational Therapy Evaluation Patient Details Name: Dawn Salas MRN: [id number] DOB: Feb 04, 1940 Today's Date: 01/10/2023   History of Present Illness Pt is a 82 y.o. F who presents 01/09/2023 with history of multiple intracranial meningiomas now s/p left frontal craniotomy. Significant PMH: craniotomy (2013), ACDF, lumbar fusion, R TKA.   Clinical Impression   Dawn Salas was evaluated s/p the above admission list. She is indep at baseline with intermittent use of SPC. Upon evaluation, pt demonstrated mod I ability to complete mobility and ADLs. She tolerated a full wash up while standing at the sink, no adverse symptoms, drop in BP in increase in pain. Pt does not require further acute, or follow up OT services. Recommend discharge back to pt's environment with assist as needed. OT to sign off with appreciation of order, please re-consult if needed.          If plan is discharge home, recommend the following: Assist for transportation;Assistance with cooking/housework    Functional Status Assessment  Patient has had a recent decline in their functional status and demonstrates the ability to make significant improvements in function in a reasonable and predictable amount of time.  Equipment Recommendations  None recommended by OT       Precautions / Restrictions Precautions Precautions: Fall Precaution Comments: crani Restrictions Weight Bearing Restrictions Per Provider Order: No      Mobility Bed Mobility Overal bed mobility: Modified Independent                  Transfers Overall transfer level: Modified independent Equipment used: Straight cane                      Balance Overall balance assessment: Mild deficits observed, not formally tested                                         ADL either performed or assessed with clinical judgement   ADL Overall ADL's : Modified independent                                        General ADL Comments: pt completed full sponge bath at sink     Vision Baseline Vision/History: 1 Wears glasses Patient Visual Report: No change from baseline Vision Assessment?: Vision impaired- to be further tested in functional context Additional Comments: diplopia at baseline, wears lenses to correct     Perception Perception: Within Functional Limits       Praxis Praxis: [facility]       Pertinent Vitals/Pain Pain Assessment Pain Assessment: No/denies pain     Extremity/Trunk Assessment Upper Extremity Assessment Upper Extremity Assessment: Overall WFL for tasks assessed   Lower Extremity Assessment Lower Extremity Assessment: Defer to PT evaluation RLE Deficits / Details: Increased edema distally, which pt states is baseline LLE Deficits / Details: Increased edema distally, which pt states is baseline   Cervical / Trunk Assessment Cervical / Trunk Assessment: Normal   Communication Communication Communication: No apparent difficulties   Cognition Arousal: Alert Behavior During Therapy: WFL for tasks assessed/performed Overall Cognitive Status: Within Functional Limits for tasks assessed  General Comments  VSS on RA, no drop in pressure    Exercises     Shoulder Instructions      Home Living Family/patient expects to be discharged to:: Private residence Living Arrangements: Spouse/significant other;Children Available Help at Discharge: Family;Available 24 hours/day Type of Home: House Home Access: Stairs to enter Entrance Stairs-Number of Steps: 1 +1   Home Layout: Two level;Able to live on main level with bedroom/bathroom Alternate Level Stairs-Number of Steps: 13   Bathroom Shower/Tub: Walk-in shower         Home Equipment: Cane - single point;Hand held shower head;Grab bars - tub/shower;Rolling Walker (2 wheels);Shower seat - built in          Prior Functioning/Environment Prior Level of  Function : Independent/Modified Independent;Driving             Mobility Comments: cane, reports fall in May where she fell off a chair on porch ADLs Comments: independent ADL's/IADL's        OT Problem List: Decreased activity tolerance;Decreased safety awareness      OT Treatment/Interventions:      OT Goals(Current goals can be found in the care plan section) Acute Rehab OT Goals Patient Stated Goal: home tomorrow OT Goal Formulation: With patient Time For Goal Achievement: 01/10/23 Potential to Achieve Goals: Good  OT Frequency:      Co-evaluation              AM-PAC OT "6 Clicks" Daily Activity     Outcome Measure Help from another person eating meals?: None Help from another person taking care of personal grooming?: None Help from another person toileting, which includes using toliet, bedpan, or urinal?: None Help from another person bathing (including washing, rinsing, drying)?: None Help from another person to put on and taking off regular upper body clothing?: None Help from another person to put on and taking off regular lower body clothing?: None 6 Click Score: 24   End of Session Nurse Communication: Mobility status  Activity Tolerance: Patient tolerated treatment well Patient left: in bed;with call bell/phone within reach;with bed alarm set  OT Visit Diagnosis: Other abnormalities of gait and mobility (R26.89);Muscle weakness (generalized) (M62.81)                Time: 8657-8469 OT Time Calculation (min): 24 min Charges:  OT General Charges $OT Visit: 1 Visit OT Evaluation $OT Eval Moderate Complexity: 1 Mod OT Treatments $Self Care/Home Management : 8-22 mins  Derenda Mis, OTR/L Acute Rehabilitation Services Office [phone] Secure Chat Communication Preferred   Donia Pounds 01/10/2023, 4:39 PM

## 2023-01-10 NOTE — Progress Notes (Signed)
Ms Dawn Salas ambulated to [facility], report given to Dawn Salas, who was at bedside and help orient patient to the room. All of her belongings were brought to room 6 with the patient. Her daughter, Aggie Cosier was informed of her move, all questions answered. Safety maintained.

## 2023-01-11 LAB — GLUCOSE, CAPILLARY: Glucose-Capillary: 201 mg/dL — ABNORMAL HIGH (ref 70–99)

## 2023-01-11 MED ORDER — LEVETIRACETAM 500 MG PO TABS: 500.0000 mg | ORAL_TABLET | Freq: Two times a day (BID) | ORAL | 0 refills | Status: DC

## 2023-01-11 MED ORDER — HYDROCODONE-ACETAMINOPHEN 5-325 MG PO TABS: 1.0000 | ORAL_TABLET | ORAL | 0 refills | Status: DC | PRN

## 2023-01-11 MED ORDER — DOCUSATE SODIUM 100 MG PO CAPS: 100.0000 mg | ORAL_CAPSULE | Freq: Two times a day (BID) | ORAL | 0 refills | Status: DC

## 2023-01-11 NOTE — Discharge Summary (Signed)
Physician Discharge Summary  Patient ID: Dawn Salas MRN: [id number] DOB/AGE: 1940-10-08 82 y.o.  Admit date: 01/09/2023 Discharge date: 01/11/2023  Admission Diagnoses: Brain tumor  Discharge Diagnoses: Brain tumor Principal Problem:   S/P craniotomy Active Problems:   Brain tumor [facility])   Discharged Condition: good  Hospital Course: I performed a left frontal craniotomy on the patient on 01/09/2023 for resection of intracranial meningioma.  The surgery went well.  The patient's postoperative course was unremarkable.  On postoperative day #2 the patient felt well and requested discharge to home.  She was given verbal and written discharge instructions.  We discussed seizure precautions.  I have answered all her questions.  Consults: PT, OT, care management Significant Diagnostic Studies: None Treatments: Left frontal craniotomy for gross total resection of tumor using microdissection and Stealth neuronavigation Discharge Exam: Blood pressure (!) 158/87, pulse 89, temperature 97.9 F (36.6 C), temperature source Oral, resp. rate 16, height 5\' 4"  (1.626 m), weight 108.9 kg, SpO2 100%. The patient is alert and pleasant.  She looks well.  Her strength and speech are normal.  His dressing is clean and dry.  Disposition: Home  Discharge Instructions     Call MD for:  difficulty breathing, headache or visual disturbances   Complete by: As directed    Call MD for:  extreme fatigue   Complete by: As directed    Call MD for:  hives   Complete by: As directed    Call MD for:  persistant dizziness or light-headedness   Complete by: As directed    Call MD for:  persistant nausea and vomiting   Complete by: As directed    Call MD for:  redness, tenderness, or signs of infection (pain, swelling, redness, odor or green/yellow discharge around incision site)   Complete by: As directed    Call MD for:  severe uncontrolled pain   Complete by: As directed    Call MD for:  temperature  >100.4   Complete by: As directed    Diet - low sodium heart healthy   Complete by: As directed    Discharge instructions   Complete by: As directed    Call [phone] for a followup appointment. Take a stool softener while you are using pain medications.   Driving Restrictions   Complete by: As directed    Do not drive for 2 weeks.   Increase activity slowly   Complete by: As directed    Lifting restrictions   Complete by: As directed    Do not lift more than 5 pounds. No excessive bending or twisting.   May shower / Bathe   Complete by: As directed    Remove the dressing for 3 days after surgery.  You may shower, but leave the incision alone.   Remove dressing in 24 hours   Complete by: As directed       Allergies as of 01/11/2023       Reactions   Flagyl [metronidazole] Other (See Comments)   Caused increased heart rate   Aspirin Effervescent Swelling   Alka-Seltzer gel caps- sore mouth, face swollen, turned hands white   Bupivacaine Other (See Comments)   Eye Batting really fast, Could not speak normally, Words were long   Morphine Sulfate Hives   Hallucinations   Penicillins Swelling, Rash   Previously tolerated cephalexin, cefazolin 03/19/22        Medication List     TAKE these medications    Accu-Chek Aviva Plus test strip  Generic drug: glucose blood TEST BLOOD SUGAR TWICE DAILY.   accu-chek softclix lancets 1 each by Other route 2 (two) times daily. E11.9   Accu-Chek Softclix Lancets lancets TEST BLOOD SUGAR TWICE DAILY.   acetaminophen 650 MG CR tablet Commonly known as: TYLENOL Take 1,300 mg by mouth every morning.   amLODipine 5 MG tablet Commonly known as: NORVASC TAKE ONE (1) TABLET EACH DAY   ascorbic acid 500 MG tablet Commonly known as: VITAMIN C Take 500 mg by mouth daily.   cetirizine 10 MG tablet Commonly known as: ZYRTEC Take 10 mg by mouth daily as needed for allergies.   docusate sodium 100 MG capsule Commonly known as:  COLACE Take 1 capsule (100 mg total) by mouth 2 (two) times daily.   Flax Seed Oil 1000 MG Caps Take 1,000 mg by mouth daily.   GERITOL COMPLETE PO Take 1 tablet by mouth daily.   HYDROcodone-acetaminophen 5-325 MG tablet Commonly known as: NORCO/VICODIN Take 1 tablet by mouth every 4 (four) hours as needed for moderate pain (pain score 4-6).   Janumet XR 50-500 MG Tb24 Generic drug: SitaGLIPtin-MetFORMIN HCl TAKE ONE (1) TABLET EACH DAY   levETIRAcetam 500 MG tablet Commonly known as: KEPPRA Take 1 tablet (500 mg total) by mouth 2 (two) times daily.   levothyroxine 100 MCG tablet Commonly known as: SYNTHROID Take 1 tablet (100 mcg total) by mouth daily. What changed: how much to take   losartan-hydrochlorothiazide 100-12.5 MG tablet Commonly known as: HYZAAR TAKE ONE (1) TABLET EACH DAY What changed: See the new instructions.   Magnesium 250 MG Tabs Take 250 mg by mouth daily.   meclizine 25 MG tablet Commonly known as: ANTIVERT Take 25 mg by mouth 3 (three) times daily as needed for dizziness.   metoprolol succinate 50 MG 24 hr tablet Commonly known as: TOPROL-XL TAKE ONE (1) TABLET BY MOUTH EVERY DAY   potassium chloride 10 MEQ tablet Commonly known as: KLOR-CON TAKE ONE (1) TABLET EACH DAY   RABEprazole 20 MG tablet Commonly known as: ACIPHEX TAKE ONE (1) TABLET EACH DAY What changed: See the new instructions.   rosuvastatin 40 MG tablet Commonly known as: CRESTOR Take 40 mg by mouth daily.   solifenacin 5 MG tablet Commonly known as: VESICARE TAKE ONE (1) TABLET BY MOUTH EVERY DAY   Systane Balance 0.6 % Soln Generic drug: Propylene Glycol Place 1 drop into both eyes daily as needed (dry eyes).   valACYclovir 1000 MG tablet Commonly known as: VALTREX TAKE 2 TABS TWICE DAILY FOR 1 DAY THEN AS NEEDED What changed: See the new instructions.         Signed: Cristi Loron 01/11/2023, 7:38 AM

## 2023-01-11 NOTE — Progress Notes (Signed)
Pt discharge education and instructions completed with pt; pt voices understanding and denies any questions. Pt IV removed; pt discharge home with daughter to transport her home. Pt offered assistance in dressing but pt refuses stating "I can get it myself". Pt to pick electronically sent prescriptions from preferred pharmacy on file. Pt incision to left head remains unremarkable with honeycomb dsg, clean, dry and intact with no drainage or bleeding noted. Pt transported off unit via wheelchair with belongings to the side. Dionne Bucy RN

## 2023-01-14 ENCOUNTER — Telehealth: Payer: Self-pay | Admitting: *Deleted

## 2023-01-14 NOTE — Transitions of Care (Post Inpatient/ED Visit) (Signed)
01/14/2023  Name: Dawn Salas MRN: [id number] DOB: 1940/07/27  Today's TOC FU Call Status: Today's TOC FU Call Status:: Unsuccessful Call (1st Attempt) Unsuccessful Call (1st Attempt) Date: 01/14/23  [id number] incoming call from patient Today's TOC FU status Successful call  Patient's Name and Date of Birth confirmed.   Transition Care Management Follow-up Telephone Call Discharge Facility: [facility]) Type of Discharge: Inpatient Admission Primary Inpatient Discharge Diagnosis:: mmeningioma/ craniotomy How have you been since you were released from the hospital?: Better (I am doing really good)  Items Reviewed: Did you receive and understand the discharge instructions provided?: Yes Medications obtained,verified, and reconciled?: Yes (Medications Reviewed) Any new allergies since your discharge?: No Dietary orders reviewed?: Yes Type of Diet Ordered:: low sodium carb modified heart healthy Do you have support at home?: Yes People in Home: child(ren), adult Name of Support/Comfort Primary Source: faye and Rosey Bath  Medications Reviewed Today: Medications Reviewed Today     Reviewed by Luella Cook, RN (Case Manager) on 01/14/23 at 1110  Med List Status: <None>   Medication Order Taking? Sig Documenting Provider Last Dose Status Informant  ACCU-CHEK AVIVA PLUS test strip [id number] Yes TEST BLOOD SUGAR TWICE DAILY. Kristian Covey, MD Taking Active Self  Accu-Chek Softclix Lancets lancets [id number] Yes TEST BLOOD SUGAR TWICE DAILY. Kristian Covey, MD Taking Active Self  acetaminophen (TYLENOL) 650 MG CR tablet [id number] Yes Take 1,300 mg by mouth every morning. [provider] Taking Active Self  amLODipine (NORVASC) 5 MG tablet [id number] Yes TAKE ONE (1) TABLET EACH DAY Burchette, Elberta Fortis, MD Taking Active Self  ascorbic acid (VITAMIN C) 500 MG tablet [id number] Yes Take 500 mg by mouth daily. [provider] Taking Active Self  cetirizine  (ZYRTEC) 10 MG tablet [id number] Yes Take 10 mg by mouth daily as needed for allergies.  [provider] Taking Active Self  docusate sodium (COLACE) 100 MG capsule [id number] Yes Take 1 capsule (100 mg total) by mouth 2 (two) times daily. Tressie Stalker, MD Taking Active   Flaxseed, Linseed, (FLAX SEED OIL) 1000 MG CAPS [id number] Yes Take 1,000 mg by mouth daily. [provider] Taking Active Self  HYDROcodone-acetaminophen (NORCO/VICODIN) 5-325 MG tablet [id number] Yes Take 1 tablet by mouth every 4 (four) hours as needed for moderate pain (pain score 4-6). Tressie Stalker, MD Taking Active   Iron-Vitamins [facility] COMPLETE PO) [id number] Yes Take 1 tablet by mouth daily. [provider] Taking Active Self  Lancet Devices (ACCU-CHEK [facility]) lancets [id number] Yes 1 each by Other route 2 (two) times daily. E11.9 Kristian Covey, MD Taking Active Self  levETIRAcetam (KEPPRA) 500 MG tablet [id number] Yes Take 1 tablet (500 mg total) by mouth 2 (two) times daily. Tressie Stalker, MD Taking Active   levothyroxine (SYNTHROID) 100 MCG tablet [id number] Yes Take 1 tablet (100 mcg total) by mouth daily.  Patient taking differently: Take 75 mcg by mouth daily.   Reather Littler, MD Taking Active Self  losartan-hydrochlorothiazide [facility]) 100-12.5 MG tablet [id number] Yes TAKE ONE (1) TABLET EACH DAY  Patient taking differently: Take 0.5 tablets by mouth daily.   Kristian Covey, MD Taking Active Self  Magnesium 250 MG TABS [id number] Yes Take 250 mg by mouth daily. [provider] Taking Active Self  meclizine (ANTIVERT) 25 MG tablet [id number] Yes Take 25 mg by mouth 3 (three) times daily as needed for dizziness.  [provider] Taking Active Self  metoprolol succinate (TOPROL-XL) 50 MG 24 hr tablet [id number]  Yes TAKE ONE (1) TABLET BY MOUTH EVERY DAY Burchette, Elberta Fortis, MD Taking Active Self  potassium chloride (KLOR-CON) 10 MEQ tablet [id number] Yes TAKE ONE (1)  TABLET EACH DAY Burchette, Elberta Fortis, MD Taking Active Self  Propylene Glycol (SYSTANE BALANCE) 0.6 % SOLN [id number] Yes Place 1 drop into both eyes daily as needed (dry eyes). [provider] Taking Active Self  RABEprazole (ACIPHEX) 20 MG tablet [id number] Yes TAKE ONE (1) TABLET EACH DAY  Patient taking differently: Take 20 mg by mouth daily.   Kristian Covey, MD Taking Active Self  rosuvastatin (CRESTOR) 40 MG tablet [id number] Yes Take 40 mg by mouth daily. [provider] Taking Active Self  SitaGLIPtin-MetFORMIN HCl (JANUMET XR) 50-500 MG TB24 [id number] Yes TAKE ONE (1) TABLET EACH DAY Reather Littler, MD Taking Active Self  solifenacin (VESICARE) 5 MG tablet [id number] Yes TAKE ONE (1) TABLET BY MOUTH EVERY DAY Burchette, Elberta Fortis, MD Taking Active Self  valACYclovir (VALTREX) 1000 MG tablet [id number] Yes TAKE 2 TABS TWICE DAILY FOR 1 DAY THEN AS NEEDED  Patient taking differently: Take 500-1,000 mg by mouth daily as needed (cold sores).   Kristian Covey, MD Taking Active Self    Discontinued 04/11/11 1204 (Error)             Home Care and Equipment/Supplies: Were Home Health Services Ordered?: NA Any new equipment or medical supplies ordered?: NA  Functional Questionnaire: Do you need assistance with bathing/showering or dressing?: No Do you need assistance with meal preparation?: Yes Do you need assistance with eating?: No Do you have difficulty maintaining continence: No Do you need assistance with getting out of bed/getting out of a chair/moving?: No Do you have difficulty managing or taking your medications?: No  Follow up appointments reviewed: PCP Follow-up appointment confirmed?: NA Specialist Hospital Follow-up appointment confirmed?: No Reason Specialist Follow-Up Not Confirmed: Patient has Specialist Provider Number and will Call for Appointment (Patient is to call today to set up the appointment) Do you need transportation to your follow-up  appointment?: No Do you understand care options if your condition(s) worsen?: Yes-patient verbalized understanding  SDOH Interventions Today    Flowsheet Row Most Recent Value  SDOH Interventions   Food Insecurity Interventions Intervention Not Indicated  Housing Interventions Intervention Not Indicated  Transportation Interventions Intervention Not Indicated, Patient Resources (Friends/Family)  Utilities Interventions Intervention Not Indicated      Interventions Today    Flowsheet Row Most Recent Value  Chronic Disease   Chronic disease during today's visit Other  [meningioma/ craniotomy]  General Interventions   General Interventions Discussed/Reviewed General Interventions Discussed, General Interventions Reviewed  Nutrition Interventions   Nutrition Discussed/Reviewed Nutrition Discussed, Nutrition Reviewed  [discussed [employer] meal Plan and how to initiate]  Pharmacy Interventions   Pharmacy Dicussed/Reviewed Pharmacy Topics Discussed, Pharmacy Topics Reviewed      Patient declined follow up Care Coordination calls.  RN discussed [facility] Meal Plan and how to initiate.  Gean Maidens BSN RN Population Health- Transition of Care Team.  [facility] [phone]

## 2023-01-17 ENCOUNTER — Other Ambulatory Visit: Payer: Self-pay | Admitting: Family Medicine

## 2023-01-30 ENCOUNTER — Encounter: Payer: Self-pay | Admitting: Endocrinology

## 2023-01-30 ENCOUNTER — Ambulatory Visit (INDEPENDENT_AMBULATORY_CARE_PROVIDER_SITE_OTHER): Payer: Medicare Other | Admitting: Endocrinology

## 2023-01-30 VITALS — BP 118/80 | HR 90 | Resp 20 | Ht 64.0 in | Wt 240.2 lb

## 2023-01-30 DIAGNOSIS — E890 Postprocedural hypothyroidism: Secondary | ICD-10-CM

## 2023-01-30 DIAGNOSIS — Z8639 Personal history of other endocrine, nutritional and metabolic disease: Secondary | ICD-10-CM

## 2023-01-30 DIAGNOSIS — Z7984 Long term (current) use of oral hypoglycemic drugs: Secondary | ICD-10-CM

## 2023-01-30 DIAGNOSIS — E119 Type 2 diabetes mellitus without complications: Secondary | ICD-10-CM | POA: Diagnosis not present

## 2023-01-30 MED ORDER — JANUMET XR 50-500 MG PO TB24: ORAL_TABLET | ORAL | 5 refills | Status: DC

## 2023-01-30 MED ORDER — JANUMET XR 50-500 MG PO TB24: ORAL_TABLET | ORAL | 3 refills | Status: DC

## 2023-01-30 MED ORDER — LEVOTHYROXINE SODIUM 100 MCG PO TABS: 100.0000 ug | ORAL_TABLET | Freq: Every day | ORAL | 3 refills | Status: DC

## 2023-01-30 NOTE — Progress Notes (Signed)
 Outpatient Endocrinology Note Dawn Sia, MD  01/30/23  Patient's Name: Dawn Salas    DOB: 08/12/1940    MRN: [id number]                                                    REASON OF VISIT: Follow up for type 2 diabetes mellitus / hypothyroidism  PCP: Micheal Wolm ORN, MD  HISTORY OF PRESENT ILLNESS:   Dawn Salas is a 83 y.o. 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   Pertinent Diabetes History: Patient has longstanding history of type 2 diabetes mellitus with mild obesity.  She has controlled type 2 diabetes mellitus.  Chronic Diabetes Complications : Retinopathy: no. Last ophthalmology exam was done on annually, reportedly.  Nephropathy: no, on losartan  Peripheral neuropathy: no Coronary artery disease: no Stroke: no  Relevant comorbidities and cardiovascular risk factors: Obesity: yes Body mass index is 41.23 kg/m.  Hypertension: yes Hyperlipidemia. Yes, on statin.   Current / Home Diabetic regimen includes:  Janumet  XR 5/500 1 tablet daily at lunchtime.  Prior diabetic medications: Metformin  alone in the past.  Glycemic data:   Not able to download glucometer in the clinic today.  However blood sugar reviewed directly from the meter as follows.  All the blood sugars are acceptable : 116, 111, 124, 118, 123, 125, 119, 169.  Hypoglycemia: Patient has no hypoglycemic episodes. Patient has hypoglycemia awareness.  She feels hypoglycemic symptoms when blood sugar is below 90.  # Postablative hypothyroidism -Patient had multinodular goiter evaluated with fine-needle aspiration in September 2012.  She was subsequently diagnosed with having hyperthyroidism in 2015 when she had palpitations and had a goiter.  At that time she was having symptoms of fatigue and shakiness and palpitation, heat intolerance and and some weight loss.  She was treated with I-131 unknown dose  radioactive iodine  ablation in 2015 however subsequently her hyperthyroidism reoccurred and methimazole  was restarted in ?  2017.  Due to persistent significant hyperthyroidism and frequently elevated thyrotropin receptor antibodies she was treated with RAI I-131 with 20.3 mCi on Jun 04, 2016.  Patient had Graves' disease status post RAI ablation x 2 , now postablative hypothyroidism.  Patient is currently on thyroid  hormone replacement /levothyroxine  and dose had been adjusted multiple times in the past.  In July 2022.  Patient had TSH 10, levothyroxine  dose was increased from 75 to 100 mcg daily.   Taking levothyroxine  100 mcg daily.  Graves' ophthalmopathy:  History of diplopia treated by ophthalmologist with wearing prism  lenses. Has some tearing of the eyes and has been given drops by the ophthalmologist. She is followed by ophthalmologist regularly Previous MRI had shown mild thickening of the rectus muscle on the left in 2018.  Interval history  She has been on Janumet .  She got a letter from her medical insurance formulary is being changed to saxagliptin/metformin  combination.  Patient is concerned about potential allergic reaction, as she is allergic with multiple medications.  She would like to continue on Janumet .  Glucose data as reviewed above.  Recent hemoglobin A1c 6.6%.  She has been taking levothyroxine  100 mcg daily, confirmed with calling her pharmacy, not 75 mcg.  Denies hypo and hyperthyroid symptoms.  Denies palpitation.  No other complaints today.  REVIEW OF SYSTEMS As per history of present illness.   PAST MEDICAL HISTORY: Past Medical History:  Diagnosis Date   Allergy    Anxiety    Arthritis    Asthma    patient denies    Cataract    bilateral lens implants    Colon polyps    Depression    Diabetes mellitus    Type two   Diverticulitis    Double vision 2019   left eye   Family history of adverse reaction to anesthesia    daughter- N/V    Family history  of ovarian cancer    Family history of pancreatic cancer    Frequency of urination    GERD (gastroesophageal reflux disease)    History of adverse reaction to anesthesia 03/19/2022   After regional block for TKA pt states she had uncontrollable frequent blinking and zoned out was unable to speak more than one or two words for about 10 minutes   History of hiatal hernia    Hyperglycemia    Hyperlipidemia    Hypertension    Hyperthyroidism    PT HAD BIPOSY -NEGATIVE.SABRA AND NOW JUST GETS CHECKED EACH YEAR .SABRA 2013 LAST TEST   SEES DR. [name].    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 DOES NOT USE C PAP   Palpitations    Peptic ulcer disease    Pneumonia    walking pneumonia hx of   Seizures (HCC) 2015   one siezure with brain surgery none since 2015    Sleep apnea    no CPAP   Urinary tract infection 03/2020   Vertigo     PAST SURGICAL HISTORY: Past Surgical History:  Procedure Laterality Date   ABDOMINAL HYSTERECTOMY     ANTERIOR CERVICAL DECOMP/DISCECTOMY FUSION N/A 04/19/2021   Procedure: Anterior Cervical Discectomy and Fusion with Interbody Prosthesis, Plates/Screws Cervical Three-Four/Cervical Four-Five REMoval CERVICAL PLATE;  Surgeon: Mavis Purchase, MD;  Location: [facility] OR;  Service: Neurosurgery;  Laterality: N/A;  3C   APPLICATION OF CRANIAL NAVIGATION Left 01/09/2023   Procedure: APPLICATION OF CRANIAL NAVIGATION;  Surgeon: Mavis Purchase, MD;  Location: [facility] OR;  Service: Neurosurgery;  Laterality: Left;   BACK SURGERY     L SPIN 2003   NECK 2004   BRAIN MENINGIOMA EXCISION  08/2008   X 2   C5-C6 neck fusion     CATARACT EXTRACTION Bilateral    COLONOSCOPY  05/05/2009   pt states multiple polyps removed in both colonoscopies she has had   CRANIOTOMY  08/13/2011   Procedure: CRANIOTOMY TUMOR EXCISION;  Surgeon: Purchase JONETTA Mavis, MD;  Location: [facility];  Service: Neurosurgery;  Laterality: Bilateral;   Bifrontal craniotomy for tumor   CRANIOTOMY Left 01/09/2023   Procedure: FRONTAL CRANIOTOMY;  Surgeon: Mavis Purchase, MD;  Location: [facility] OR;  Service: Neurosurgery;  Laterality: Left;   DILATION AND CURETTAGE OF UTERUS     YEARS AGO   L4-L5 posterior la  2021   L3-L5 fusion   left foot plantar and hammertoe     right foot bunectomy     right knee arthroscopy     x 3   right shoulder rotator cuff     ROBOTIC ASSISTED BILATERAL SALPINGO OOPHERECTOMY N/A 08/25/2015   Procedure: XI ROBOTIC ASSISTED BILATERAL SALPINGO OOPHORECTOMY;  Surgeon: Sari Bachelor, MD;  Location: [facility] ORS;  Service: Gynecology;  Laterality: N/A;   TONSILLECTOMY  TOTAL KNEE ARTHROPLASTY Right 03/19/2022   Procedure: TOTAL KNEE ARTHROPLASTY;  Surgeon: Melodi Lerner, MD;  Location: [facility] ORS;  Service: Orthopedics;  Laterality: Right;   TUBAL LIGATION     WRIST SURGERY Right     ALLERGIES: Allergies  Allergen Reactions   Flagyl  [Metronidazole ] Other (See Comments)    Caused increased heart rate   Aspirin Effervescent Swelling    Alka-Seltzer gel caps- sore mouth, face swollen, turned hands white   Bupivacaine  Other (See Comments)    Eye Batting really fast, Could not speak normally, Words were long   Morphine  Sulfate Hives    Hallucinations   Penicillins Swelling and Rash    Previously tolerated cephalexin , cefazolin  03/19/22    FAMILY HISTORY:  Family History  Problem Relation Age of Onset   Ovarian cancer Mother    Heart disease Father 32   Pancreatic cancer Maternal Aunt    Diabetes Maternal Grandmother    Anesthesia problems Daughter        NAUSEA AND VOMITING POST OP   Arthritis Other    Hyperlipidemia Other    Hypertension Other    Diabetes Other    Breast cancer Neg Hx    Colon cancer Neg Hx    Colon polyps Neg Hx    Esophageal cancer Neg Hx    Stomach cancer Neg Hx    Rectal cancer Neg Hx     SOCIAL HISTORY: Social History   Socioeconomic History   Marital status: Married     Spouse name: Not on file   Number of children: 6   Years of education: Not on file   Highest education level: Not on file  Occupational History   Not on file  Tobacco Use   Smoking status: Never   Smokeless tobacco: Never  Vaping Use   Vaping status: Never Used  Substance and Sexual Activity   Alcohol  use: No   Drug use: No   Sexual activity: Never  Other Topics Concern   Not on file  Social History Narrative   Not on file   Social Drivers of Health   Financial Resource Strain: Low Risk  (11/19/2022)   Overall Financial Resource Strain (CARDIA)    Difficulty of Paying Living Expenses: Not hard at all  Food Insecurity: No Food Insecurity (01/14/2023)   Hunger Vital Sign    Worried About Running Out of Food in the Last Year: Never true    Ran Out of Food in the Last Year: Never true  Transportation Needs: No Transportation Needs (01/14/2023)   PRAPARE - Administrator, Civil Service (Medical): No    Lack of Transportation (Non-Medical): No  Physical Activity: Insufficiently Active (11/19/2022)   Exercise Vital Sign    Days of Exercise per Week: 7 days    Minutes of Exercise per Session: 20 min  Stress: No Stress Concern Present (11/19/2022)   [employer] of Occupational Health - Occupational Stress Questionnaire    Feeling of Stress : Not at all  Social Connections: Socially Integrated (11/19/2022)   Social Connection and Isolation Panel [NHANES]    Frequency of Communication with Friends and Family: More than three times a week    Frequency of Social Gatherings with Friends and Family: More than three times a week    Attends Religious Services: More than 4 times per year    Active Member of [employer] or Organizations: Yes    Attends Banker Meetings: More than 4 times per year  Marital Status: Married    MEDICATIONS:  Current Outpatient Medications  Medication Sig Dispense Refill   ACCU-CHEK AVIVA PLUS test strip TEST BLOOD SUGAR TWICE  DAILY. 50 strip 2   Accu-Chek Softclix Lancets lancets TEST BLOOD SUGAR TWICE DAILY. 100 each 3   acetaminophen  (TYLENOL ) 650 MG CR tablet Take 1,300 mg by mouth every morning.     amLODipine  (NORVASC ) 5 MG tablet TAKE ONE (1) TABLET EACH DAY 90 tablet 1   ascorbic acid (VITAMIN C) 500 MG tablet Take 500 mg by mouth daily.     cetirizine (ZYRTEC) 10 MG tablet Take 10 mg by mouth daily as needed for allergies.      docusate sodium  (COLACE) 100 MG capsule Take 1 capsule (100 mg total) by mouth 2 (two) times daily. 30 capsule 0   Flaxseed, Linseed, (FLAX SEED OIL) 1000 MG CAPS Take 1,000 mg by mouth daily.     HYDROcodone -acetaminophen  (NORCO/VICODIN) 5-325 MG tablet Take 1 tablet by mouth every 4 (four) hours as needed for moderate pain (pain score 4-6). 30 tablet 0   Iron-Vitamins (GERITOL COMPLETE PO) Take 1 tablet by mouth daily.     Lancet Devices (ACCU-CHEK SOFTCLIX) lancets 1 each by Other route 2 (two) times daily. E11.9 100 each 3   levETIRAcetam  (KEPPRA ) 500 MG tablet Take 1 tablet (500 mg total) by mouth 2 (two) times daily. 60 tablet 0   losartan -hydrochlorothiazide  (HYZAAR) 100-12.5 MG tablet TAKE ONE (1) TABLET EACH DAY 90 tablet 0   Magnesium  250 MG TABS Take 250 mg by mouth daily.     meclizine  (ANTIVERT ) 25 MG tablet Take 25 mg by mouth 3 (three) times daily as needed for dizziness.      metoprolol  succinate (TOPROL -XL) 50 MG 24 hr tablet TAKE ONE (1) TABLET BY MOUTH EVERY DAY 90 tablet 0   potassium chloride  (KLOR-CON ) 10 MEQ tablet TAKE ONE (1) TABLET EACH DAY 90 tablet 0   Propylene Glycol (SYSTANE BALANCE) 0.6 % SOLN Place 1 drop into both eyes daily as needed (dry eyes).     RABEprazole  (ACIPHEX ) 20 MG tablet TAKE ONE (1) TABLET EACH DAY 90 tablet 0   rosuvastatin  (CRESTOR ) 40 MG tablet Take 40 mg by mouth daily.     solifenacin  (VESICARE ) 5 MG tablet TAKE ONE (1) TABLET BY MOUTH EVERY DAY 90 tablet 0   valACYclovir  (VALTREX ) 1000 MG tablet TAKE 2 TABS TWICE DAILY FOR 1 DAY  THEN AS NEEDED (Patient taking differently: Take 500-1,000 mg by mouth daily as needed (cold sores).) 30 tablet 1   levothyroxine  (SYNTHROID ) 100 MCG tablet Take 1 tablet (100 mcg total) by mouth daily. 90 tablet 3   SitaGLIPtin-MetFORMIN  HCl (JANUMET  XR) 50-500 MG TB24 TAKE ONE (1) TABLET EACH DAY 90 tablet 3   No current facility-administered medications for this visit.    PHYSICAL EXAM: Vitals:   01/30/23 1059  BP: 118/80  Pulse: 90  Resp: 20  SpO2: 97%  Weight: 240 lb 3.2 oz (109 kg)  Height: 5' 4 (1.626 m)   Body mass index is 41.23 kg/m.  Wt Readings from Last 3 Encounters:  01/30/23 240 lb 3.2 oz (109 kg)  01/09/23 240 lb (108.9 kg)  01/03/23 240 lb 1.6 oz (108.9 kg)    General: Well developed, well nourished female in no apparent distress.  HEENT: AT/[place], no external lesions.  Eyes: Conjunctiva clear and no icterus. Neck: Neck supple  Lungs: Respirations not labored Neurologic: Alert, oriented, normal speech Extremities / Skin: Dry. No  sores or rashes noted.  Psychiatric: Does not appear depressed or anxious  Diabetic Foot Exam - Simple   Simple Foot Form Visual Inspection See comments: Yes Sensation Testing See comments: Yes Pulse Check Comments DP palpable bilaterally.  Hammer toes on right feet. Callus bilaterally. No ulcer.  Monofilament sensation mildly decreased bilaterally.     LABS Reviewed Lab Results  Component Value Date   HGBA1C 6.6 (H) 01/03/2023   HGBA1C 6.3 (A) 10/24/2022   HGBA1C 6.3 (A) 08/09/2022   No results found for: FRUCTOSAMINE Lab Results  Component Value Date   CHOL 172 10/06/2021   HDL 84.30 10/06/2021   LDLCALC 73 10/06/2021   LDLDIRECT 195.4 04/15/2012   TRIG 71.0 10/06/2021   CHOLHDL 2 10/06/2021   Lab Results  Component Value Date   MICRALBCREAT 0.8 02/02/2021   MICRALBCREAT 0.7 02/02/2020   Lab Results  Component Value Date   CREATININE 1.19 (H) 01/03/2023   Lab Results  Component Value Date   GFR  42.15 (L) 08/09/2022    ASSESSMENT / PLAN  1. Controlled type 2 diabetes mellitus without complication, without long-term current use of insulin  (HCC)   2. Hypothyroidism, postablative   3. H/O Graves' disease      Diabetes Mellitus type 2, complicated by no known complications. - Diabetic status / severity: Controlled.  Lab Results  Component Value Date   HGBA1C 6.6 (H) 01/03/2023    - Hemoglobin A1c goal : <7%  - Medications: No change  I) Janumet  XR 50/500 mg 1 tablet daily.  Patient is concerned about getting allergic reaction with new medication as she has allergy with multiple medication in the past.  She wants to stay on Janumet  and does not want to change to saxagliptin/metformin  combination as per her medical insurance formulary.  It is reasonable to stay on Janumet  in this context.  - Home glucose testing: Daily in the morning fasting. - Discussed/ Gave Hypoglycemia treatment plan.  # Consult : not required at this time.   # Annual urine for microalbuminuria/ creatinine ratio, no microalbuminuria currently, continue ACE/ARB /losartan .  Will check in next follow-up visit. Last  Lab Results  Component Value Date   MICRALBCREAT 0.8 02/02/2021    # Foot check nightly.  # Annual dilated diabetic eye exams.   - Diet: Make healthy diabetic food choices - Life style / activity / exercise: Discussed.  2. Blood pressure  -  BP Readings from Last 1 Encounters:  01/30/23 118/80    - Control is in target.  - No change in current plans.  3. Lipid status / Hyperlipidemia - Last  Lab Results  Component Value Date   LDLCALC 73 10/06/2021   - Continue rosuvastatin  40 mg daily.  Managed by primary care provider.  # Postablative hypothyroidism -Continue current dose of levothyroxine  100 mcg daily.   Diagnoses and all orders for this visit:  Controlled type 2 diabetes mellitus without complication, without long-term current use of insulin  (HCC) -     BASIC  METABOLIC PANEL WITH GFR -     Hemoglobin A1c -     Microalbumin / creatinine urine ratio  Hypothyroidism, postablative -     T4, free -     TSH  H/O Graves' disease  Other orders -     Discontinue: SitaGLIPtin-MetFORMIN  HCl (JANUMET  XR) 50-500 MG TB24; TAKE ONE (1) TABLET EACH DAY -     SitaGLIPtin-MetFORMIN  HCl (JANUMET  XR) 50-500 MG TB24; TAKE ONE (1) TABLET EACH DAY -  levothyroxine  (SYNTHROID ) 100 MCG tablet; Take 1 tablet (100 mcg total) by mouth daily.     DISPOSITION Follow up in clinic in 3 months suggested.  Labs prior to follow-up visit if possible.   All questions answered and patient verbalized understanding of the plan.  Eustacio Ellen, MD [facility] Endocrinology [facility] [address] [place], [address] [place], [place] [address] Phone # [phone]  At least part of this note was generated using voice recognition software. Inadvertent word errors may have occurred, which were not recognized during the proofreading process.

## 2023-01-31 MED ORDER — ZITUVIMET XR 50-500 MG PO TB24: 1.0000 | ORAL_TABLET | Freq: Every day | ORAL | 3 refills | Status: DC

## 2023-01-31 NOTE — Telephone Encounter (Signed)
 Sent prescription

## 2023-02-01 ENCOUNTER — Other Ambulatory Visit: Payer: Self-pay

## 2023-02-01 ENCOUNTER — Ambulatory Visit: Payer: Medicare Other | Admitting: Endocrinology

## 2023-02-01 DIAGNOSIS — E119 Type 2 diabetes mellitus without complications: Secondary | ICD-10-CM

## 2023-02-01 MED ORDER — ZITUVIMET XR 50-500 MG PO TB24: 1.0000 | ORAL_TABLET | Freq: Every day | ORAL | 3 refills | Status: AC

## 2023-02-05 ENCOUNTER — Other Ambulatory Visit: Payer: Self-pay | Admitting: Family Medicine

## 2023-02-06 ENCOUNTER — Other Ambulatory Visit: Payer: Self-pay

## 2023-02-06 MED ORDER — ACCU-CHEK SOFTCLIX LANCETS MISC: 2 refills | Status: DC

## 2023-02-07 ENCOUNTER — Other Ambulatory Visit: Payer: Self-pay | Admitting: Family Medicine

## 2023-02-12 ENCOUNTER — Ambulatory Visit
Admission: RE | Admit: 2023-02-12 | Discharge: 2023-02-12 | Disposition: A | Discharge status: Home / Self Care | Payer: Medicare Other | Source: Ambulatory Visit | Attending: Otolaryngology | Admitting: Otolaryngology

## 2023-02-12 DIAGNOSIS — E079 Disorder of thyroid, unspecified: Secondary | ICD-10-CM

## 2023-02-12 DIAGNOSIS — E892 Postprocedural hypoparathyroidism: Secondary | ICD-10-CM

## 2023-02-20 ENCOUNTER — Other Ambulatory Visit: Payer: Self-pay | Admitting: Family Medicine

## 2023-02-20 DIAGNOSIS — E785 Hyperlipidemia, unspecified: Secondary | ICD-10-CM

## 2023-02-21 ENCOUNTER — Encounter: Payer: Self-pay | Admitting: Family Medicine

## 2023-02-21 LAB — HM DIABETES EYE EXAM

## 2023-02-22 NOTE — Telephone Encounter (Signed)
Rx sent and labs placed with appt scheduled

## 2023-02-25 ENCOUNTER — Other Ambulatory Visit: Payer: Medicare Other

## 2023-02-25 DIAGNOSIS — E785 Hyperlipidemia, unspecified: Secondary | ICD-10-CM

## 2023-02-25 LAB — COMPREHENSIVE METABOLIC PANEL
ALT: 11 U/L (ref 0–35)
AST: 21 U/L (ref 0–37)
Albumin: 4.2 g/dL (ref 3.5–5.2)
Alkaline Phosphatase: 90 U/L (ref 39–117)
BUN: 14 mg/dL (ref 6–23)
CO2: 26 meq/L (ref 19–32)
Calcium: 9.1 mg/dL (ref 8.4–10.5)
Chloride: 104 meq/L (ref 96–112)
Creatinine, Ser: 1.23 mg/dL — ABNORMAL HIGH (ref 0.40–1.20)
GFR: 41.58 mL/min — ABNORMAL LOW (ref >60.00)
Glucose, Bld: 105 mg/dL — ABNORMAL HIGH (ref 70–99)
Potassium: 4 meq/L (ref 3.5–5.1)
Sodium: 140 meq/L (ref 135–145)
Total Bilirubin: 0.8 mg/dL (ref 0.2–1.2)
Total Protein: 6.7 g/dL (ref 6.0–8.3)

## 2023-02-25 LAB — LIPID PANEL
Cholesterol: 171 mg/dL (ref 0–200)
HDL: 87.9 mg/dL (ref >39.00)
LDL Cholesterol: 69 mg/dL (ref 0–99)
NonHDL: 83.52
Total CHOL/HDL Ratio: 2
Triglycerides: 72 mg/dL (ref 0.0–149.0)
VLDL: 14.4 mg/dL (ref 0.0–40.0)

## 2023-03-14 ENCOUNTER — Other Ambulatory Visit: Payer: Self-pay | Admitting: Family Medicine

## 2023-03-14 ENCOUNTER — Encounter: Payer: Self-pay | Admitting: Family Medicine

## 2023-04-01 ENCOUNTER — Encounter (INDEPENDENT_AMBULATORY_CARE_PROVIDER_SITE_OTHER): Payer: Medicare Other | Admitting: Ophthalmology

## 2023-04-01 DIAGNOSIS — Z7984 Long term (current) use of oral hypoglycemic drugs: Secondary | ICD-10-CM | POA: Diagnosis not present

## 2023-04-01 DIAGNOSIS — E113293 Type 2 diabetes mellitus with mild nonproliferative diabetic retinopathy without macular edema, bilateral: Secondary | ICD-10-CM

## 2023-04-01 DIAGNOSIS — H35033 Hypertensive retinopathy, bilateral: Secondary | ICD-10-CM | POA: Diagnosis not present

## 2023-04-01 DIAGNOSIS — H43813 Vitreous degeneration, bilateral: Secondary | ICD-10-CM

## 2023-04-01 DIAGNOSIS — I10 Essential (primary) hypertension: Secondary | ICD-10-CM

## 2023-04-04 ENCOUNTER — Other Ambulatory Visit: Payer: Self-pay | Admitting: Family Medicine

## 2023-04-16 ENCOUNTER — Other Ambulatory Visit: Payer: Self-pay

## 2023-04-18 ENCOUNTER — Other Ambulatory Visit: Payer: Self-pay | Admitting: Family Medicine

## 2023-04-25 ENCOUNTER — Other Ambulatory Visit: Payer: Medicare Other

## 2023-04-26 ENCOUNTER — Encounter: Payer: Self-pay | Admitting: Endocrinology

## 2023-04-26 LAB — BASIC METABOLIC PANEL WITH GFR
BUN/Creatinine Ratio: 13 (calc) (ref 6–22)
BUN: 15 mg/dL (ref 7–25)
CO2: 26 mmol/L (ref 20–32)
Calcium: 9 mg/dL (ref 8.6–10.4)
Chloride: 98 mmol/L (ref 98–110)
Creat: 1.19 mg/dL — ABNORMAL HIGH (ref 0.60–0.95)
Glucose, Bld: 93 mg/dL (ref 65–99)
Potassium: 4.5 mmol/L (ref 3.5–5.3)
Sodium: 136 mmol/L (ref 135–146)
eGFR: 46 mL/min/{1.73_m2} — ABNORMAL LOW (ref >=60)

## 2023-04-26 LAB — HEMOGLOBIN A1C
Hgb A1c MFr Bld: 6.9 %{Hb} — ABNORMAL HIGH (ref <5.7)
Mean Plasma Glucose: 151 mg/dL
eAG (mmol/L): 8.4 mmol/L

## 2023-04-26 LAB — MICROALBUMIN / CREATININE URINE RATIO
Creatinine, Urine: 168 mg/dL (ref 20–275)
Microalb Creat Ratio: 6 mg/g{creat} (ref <30)
Microalb, Ur: 1 mg/dL

## 2023-04-26 LAB — TSH: TSH: 2.41 m[IU]/L (ref 0.40–4.50)

## 2023-04-26 LAB — T4, FREE: Free T4: 1.6 ng/dL (ref 0.8–1.8)

## 2023-05-01 ENCOUNTER — Ambulatory Visit (INDEPENDENT_AMBULATORY_CARE_PROVIDER_SITE_OTHER): Payer: Medicare Other | Admitting: Endocrinology

## 2023-05-01 ENCOUNTER — Encounter: Payer: Self-pay | Admitting: Family Medicine

## 2023-05-01 ENCOUNTER — Encounter: Payer: Self-pay | Admitting: Endocrinology

## 2023-05-01 VITALS — BP 138/70 | HR 54 | Resp 20 | Ht 64.0 in | Wt 233.2 lb

## 2023-05-01 DIAGNOSIS — E890 Postprocedural hypothyroidism: Secondary | ICD-10-CM

## 2023-05-01 DIAGNOSIS — Z8639 Personal history of other endocrine, nutritional and metabolic disease: Secondary | ICD-10-CM | POA: Diagnosis not present

## 2023-05-01 DIAGNOSIS — Z7984 Long term (current) use of oral hypoglycemic drugs: Secondary | ICD-10-CM | POA: Diagnosis not present

## 2023-05-01 DIAGNOSIS — E119 Type 2 diabetes mellitus without complications: Secondary | ICD-10-CM

## 2023-05-01 NOTE — Progress Notes (Signed)
 Patient heart rate  slightly low, patient asymptomatic.

## 2023-05-01 NOTE — Progress Notes (Signed)
 Outpatient Endocrinology Note [place] Bertie Simien, MD  05/01/23  Patient's Name: Dawn Salas    DOB: January 03, 1941    MRN: [id number]                                                    REASON OF VISIT: Follow up for type 2 diabetes mellitus / hypothyroidism  PCP: Kristian Covey, MD  HISTORY OF PRESENT ILLNESS:   Dawn Salas is a 83 y.o. 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   Pertinent Diabetes History: Patient has longstanding history of type 2 diabetes mellitus with mild obesity.  She has controlled type 2 diabetes mellitus.  Chronic Diabetes Complications : Retinopathy: no. Last ophthalmology exam was done on annually, reportedly.  Nephropathy: no, on losartan Peripheral neuropathy: no Coronary artery disease: no Stroke: no  Relevant comorbidities and cardiovascular risk factors: Obesity: yes Body mass index is 40.03 kg/m.  Hypertension: yes Hyperlipidemia. Yes, on statin.   Current / Home Diabetic regimen includes:  Zituvimet  (  saxagliptin/metformin)  XR 50/500 1 tablet daily.  Prior diabetic medications: Metformin alone in the past.  Janumet switched to [employer] for insurance formulary.  Glycemic data:   She has been checking blood sugar at different times of the day, glucose data reviewed directly from the meter some of the blood sugar are 135, 117, 115, 113,   Hypoglycemia: Patient has no hypoglycemic episodes. Patient has hypoglycemia awareness.  She feels hypoglycemic symptoms when blood sugar is below 90.  # Postablative hypothyroidism -Patient had multinodular goiter evaluated with fine-needle aspiration in September 2012.  She was subsequently diagnosed with having hyperthyroidism in 2015 when she had palpitations and had a goiter.  At that time she was having symptoms of fatigue and shakiness and palpitation, heat intolerance and and some weight loss.  She was  treated with I-131 unknown dose radioactive iodine ablation in 2015 however subsequently her hyperthyroidism reoccurred and methimazole was restarted in ?  2017.  Due to persistent significant hyperthyroidism and frequently elevated thyrotropin receptor antibodies she was treated with RAI I-131 with 20.3 mCi on Jun 04, 2016.  Patient had Graves' disease status post RAI ablation x 2 , now postablative hypothyroidism.  Patient is currently on thyroid hormone replacement /levothyroxine and dose had been adjusted multiple times in the past.  In July 2022.  Patient had TSH 10, levothyroxine dose was increased from 75 to 100 mcg daily.   Taking levothyroxine 100 mcg daily.  Graves' ophthalmopathy:  History of diplopia treated by ophthalmologist with wearing prism lenses. Has some tearing of the eyes and has been given drops by the ophthalmologist. She is followed by ophthalmologist regularly Previous MRI had shown mild thickening of the rectus muscle on the left in 2018.  Interval history  She has been taking saxagliptin/metformin and tolerating well, no allergic reaction.  Hemoglobin A1c 6.9%.  Recent laboratory results reviewed, normal thyroid function test, normal urine microalbumin creatinine ratio, normal lites and stable renal function.  She has no hypo and hyperthyroid symptoms.  Glucometer data as reviewed above.  Denies palpitation.  No other complaints today.  REVIEW OF SYSTEMS As per history of present illness.   PAST MEDICAL HISTORY: Past Medical History:  Diagnosis Date   Allergy  Anxiety    Arthritis    Asthma    patient denies    Cataract    bilateral lens implants    Colon polyps    Depression    Diabetes mellitus    Type two   Diverticulitis    Double vision 2019   left eye   Family history of adverse reaction to anesthesia    daughter- N/V    Family history of ovarian cancer    Family history of pancreatic cancer    Frequency of urination    GERD (gastroesophageal  reflux disease)    History of adverse reaction to anesthesia 03/19/2022   After regional block for TKA pt states she had uncontrollable frequent blinking and "zoned out" was unable to speak more than one or two words for about 10 minutes   History of hiatal hernia    Hyperglycemia    Hyperlipidemia    Hypertension    Hyperthyroidism    PT HAD BIPOSY -NEGATIVE.Marland Kitchen AND NOW JUST GETS CHECKED EACH YEAR .Marland Kitchen 2013 LAST TEST   SEES DR. [name].    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 DOES NOT USE C PAP   Palpitations    Peptic ulcer disease    Pneumonia    "walking pneumonia" hx of   Seizures (HCC) 2015   one siezure with brain surgery none since 2015    Sleep apnea    no CPAP   Urinary tract infection 03/2020   Vertigo     PAST SURGICAL HISTORY: Past Surgical History:  Procedure Laterality Date   ABDOMINAL HYSTERECTOMY     ANTERIOR CERVICAL DECOMP/DISCECTOMY FUSION N/A 04/19/2021   Procedure: Anterior Cervical Discectomy and Fusion with Interbody Prosthesis, Plates/Screws Cervical Three-Four/Cervical Four-Five REMoval CERVICAL PLATE;  Surgeon: Tressie Stalker, MD;  Location: [facility] OR;  Service: Neurosurgery;  Laterality: N/A;  3C   APPLICATION OF CRANIAL NAVIGATION Left 01/09/2023   Procedure: APPLICATION OF CRANIAL NAVIGATION;  Surgeon: Tressie Stalker, MD;  Location: [facility] OR;  Service: Neurosurgery;  Laterality: Left;   BACK SURGERY     L SPIN 2003   NECK 2004   BRAIN MENINGIOMA EXCISION  08/2008   X 2   C5-C6 neck fusion     CATARACT EXTRACTION Bilateral    COLONOSCOPY  05/05/2009   pt states multiple polyps removed in both colonoscopies she has had   CRANIOTOMY  08/13/2011   Procedure: CRANIOTOMY TUMOR EXCISION;  Surgeon: Cristi Loron, MD;  Location: [facility];  Service: Neurosurgery;  Laterality: Bilateral;  Bifrontal craniotomy for tumor   CRANIOTOMY Left 01/09/2023   Procedure: FRONTAL CRANIOTOMY;  Surgeon: Tressie Stalker, MD;  Location: [facility] OR;  Service: Neurosurgery;  Laterality: Left;   DILATION AND CURETTAGE OF UTERUS     YEARS AGO   L4-L5 posterior la  2021   L3-L5 fusion   left foot plantar and hammertoe     right foot bunectomy     right knee arthroscopy     x 3   right shoulder rotator cuff     ROBOTIC ASSISTED BILATERAL SALPINGO OOPHERECTOMY N/A 08/25/2015   Procedure: XI ROBOTIC ASSISTED BILATERAL SALPINGO OOPHORECTOMY;  Surgeon: Laurette Schimke, MD;  Location: [facility] ORS;  Service: Gynecology;  Laterality: N/A;   TONSILLECTOMY     TOTAL KNEE ARTHROPLASTY Right 03/19/2022   Procedure: TOTAL KNEE ARTHROPLASTY;  Surgeon: Ollen Gross, MD;  Location: [facility] ORS;  Service: Orthopedics;  Laterality: Right;   TUBAL LIGATION     WRIST SURGERY Right     ALLERGIES: Allergies  Allergen Reactions   Flagyl [Metronidazole] Other (See Comments)    Caused increased heart rate   Aspirin Effervescent Swelling    Alka-Seltzer gel caps- sore mouth, face swollen, turned hands white   Bupivacaine Other (See Comments)    Eye Batting really fast, Could not speak normally, Words were long   Morphine Sulfate Hives    Hallucinations   Penicillins Swelling and Rash    Previously tolerated cephalexin, cefazolin 03/19/22    FAMILY HISTORY:  Family History  Problem Relation Age of Onset   Ovarian cancer Mother    Heart disease Father 52   Pancreatic cancer Maternal Aunt    Diabetes Maternal Grandmother    Anesthesia problems Daughter        NAUSEA AND VOMITING POST OP   Arthritis Other    Hyperlipidemia Other    Hypertension Other    Diabetes Other    Breast cancer Neg Hx    Colon cancer Neg Hx    Colon polyps Neg Hx    Esophageal cancer Neg Hx    Stomach cancer Neg Hx    Rectal cancer Neg Hx     SOCIAL HISTORY: Social History   Socioeconomic History   Marital status: Married    Spouse name: Not on file   Number of children: 6   Years of education: Not on file   Highest education level: Not  on file  Occupational History   Not on file  Tobacco Use   Smoking status: Never   Smokeless tobacco: Never  Vaping Use   Vaping status: Never Used  Substance and Sexual Activity   Alcohol use: No   Drug use: No   Sexual activity: Never  Other Topics Concern   Not on file  Social History Narrative   Not on file   Social Drivers of Health   Financial Resource Strain: Low Risk  (11/19/2022)   Overall Financial Resource Strain (CARDIA)    Difficulty of Paying Living Expenses: Not hard at all  Food Insecurity: No Food Insecurity (01/14/2023)   Hunger Vital Sign    Worried About Running Out of Food in the Last Year: Never true    Ran Out of Food in the Last Year: Never true  Transportation Needs: No Transportation Needs (01/14/2023)   PRAPARE - Administrator, Civil Service (Medical): No    Lack of Transportation (Non-Medical): No  Physical Activity: Insufficiently Active (11/19/2022)   Exercise Vital Sign    Days of Exercise per Week: 7 days    Minutes of Exercise per Session: 20 min  Stress: No Stress Concern Present (11/19/2022)   [employer] of Occupational Health - Occupational Stress Questionnaire    Feeling of Stress : Not at all  Social Connections: Socially Integrated (11/19/2022)   Social Connection and Isolation Panel [NHANES]    Frequency of Communication with Friends and Family: More than three times a week    Frequency of Social Gatherings with Friends and Family: More than three times a week    Attends Religious Services: More than 4 times per year    Active Member of [employer] or Organizations: Yes    Attends Engineer, structural: More than 4 times per year    Marital Status: Married    MEDICATIONS:  Current Outpatient Medications  Medication Sig Dispense Refill   ACCU-CHEK AVIVA PLUS test strip  TEST BLOOD SUGAR TWICE DAILY. 50 strip 2   Accu-Chek Softclix Lancets lancets TEST BLOOD SUGAR TWICE DAILY. 100 each 0   acetaminophen  (TYLENOL) 650 MG CR tablet Take 1,300 mg by mouth every morning.     amLODipine (NORVASC) 5 MG tablet TAKE ONE (1) TABLET EACH DAY 90 tablet 0   ascorbic acid (VITAMIN C) 500 MG tablet Take 500 mg by mouth daily.     cetirizine (ZYRTEC) 10 MG tablet Take 10 mg by mouth daily as needed for allergies.      docusate sodium (COLACE) 100 MG capsule Take 1 capsule (100 mg total) by mouth 2 (two) times daily. 30 capsule 0   Flaxseed, Linseed, (FLAX SEED OIL) 1000 MG CAPS Take 1,000 mg by mouth daily.     HYDROcodone-acetaminophen (NORCO/VICODIN) 5-325 MG tablet Take 1 tablet by mouth every 4 (four) hours as needed for moderate pain (pain score 4-6). 30 tablet 0   Iron-Vitamins (GERITOL COMPLETE PO) Take 1 tablet by mouth daily.     Lancet Devices (ACCU-CHEK SOFTCLIX) lancets 1 each by Other route 2 (two) times daily. E11.9 100 each 3   levETIRAcetam (KEPPRA) 500 MG tablet Take 1 tablet (500 mg total) by mouth 2 (two) times daily. 60 tablet 0   levothyroxine (SYNTHROID) 100 MCG tablet Take 1 tablet (100 mcg total) by mouth daily. 90 tablet 3   losartan-hydrochlorothiazide (HYZAAR) 100-12.5 MG tablet TAKE ONE (1) TABLET EACH DAY 90 tablet 0   Magnesium 250 MG TABS Take 250 mg by mouth daily.     meclizine (ANTIVERT) 25 MG tablet Take 25 mg by mouth 3 (three) times daily as needed for dizziness.      metoprolol succinate (TOPROL-XL) 50 MG 24 hr tablet TAKE ONE (1) TABLET BY MOUTH EVERY DAY 90 tablet 0   potassium chloride (KLOR-CON) 10 MEQ tablet TAKE ONE (1) TABLET EACH DAY 90 tablet 0   Propylene Glycol (SYSTANE BALANCE) 0.6 % SOLN Place 1 drop into both eyes daily as needed (dry eyes).     RABEprazole (ACIPHEX) 20 MG tablet TAKE ONE (1) TABLET EACH DAY 90 tablet 0   rosuvastatin (CRESTOR) 40 MG tablet TAKE ONE (1) TABLET EACH DAY 90 tablet 3   Sitaglipt Base-Metform HCl ER (ZITUVIMET XR) 50-500 MG TB24 Take 1 tablet by mouth daily. 90 tablet 3   solifenacin (VESICARE) 5 MG tablet TAKE ONE (1) TABLET BY  MOUTH EVERY DAY 90 tablet 0   valACYclovir (VALTREX) 1000 MG tablet Take two tablets at onset of cold sore and repeat two tablets in 12 hours. 30 tablet 1   SitaGLIPtin-MetFORMIN HCl (JANUMET XR) 50-500 MG TB24 TAKE ONE (1) TABLET EACH DAY (Patient not taking: Reported on 05/01/2023) 90 tablet 3   No current facility-administered medications for this visit.    PHYSICAL EXAM: Vitals:   05/01/23 0959  BP: 138/70  Pulse: (!) 54  Resp: 20  SpO2: 97%  Weight: 233 lb 3.2 oz (105.8 kg)  Height: 5\' 4"  (1.626 m)    Body mass index is 40.03 kg/m.  Wt Readings from Last 3 Encounters:  05/01/23 233 lb 3.2 oz (105.8 kg)  01/30/23 240 lb 3.2 oz (109 kg)  01/09/23 240 lb (108.9 kg)    General: Well developed, well nourished female in no apparent distress.  HEENT: AT/[place], no external lesions.  Eyes: Conjunctiva clear and no icterus. Neck: Neck supple  Lungs: Respirations not labored Neurologic: Alert, oriented, normal speech Extremities / Skin: Dry.  Psychiatric: Does not  appear depressed or anxious  Diabetic Foot Exam - Simple   No data filed    LABS Reviewed Lab Results  Component Value Date   HGBA1C 6.9 (H) 04/25/2023   HGBA1C 6.6 (H) 01/03/2023   HGBA1C 6.3 (A) 10/24/2022   No results found for: "FRUCTOSAMINE" Lab Results  Component Value Date   CHOL 171 02/25/2023   HDL 87.90 02/25/2023   LDLCALC 69 02/25/2023   LDLDIRECT 195.4 04/15/2012   TRIG 72.0 02/25/2023   CHOLHDL 2 02/25/2023   Lab Results  Component Value Date   MICRALBCREAT 6 04/25/2023   MICRALBCREAT 0.8 02/02/2021   Lab Results  Component Value Date   CREATININE 1.19 (H) 04/25/2023   Lab Results  Component Value Date   GFR 41.58 (L) 02/25/2023    ASSESSMENT / PLAN  1. Controlled type 2 diabetes mellitus without complication, without long-term current use of insulin (HCC)   2. Hypothyroidism, postablative   3. H/O Graves' disease     Diabetes Mellitus type 2, complicated by no known  complications. - Diabetic status / severity: Controlled.  Lab Results  Component Value Date   HGBA1C 6.9 (H) 04/25/2023    - Hemoglobin A1c goal : <7%  - Medications: No change  I) continue Zituvimet  (  saxagliptin/metformin)  XR 50/500 1 tablet daily.  - Home glucose testing: Daily in the morning fasting. - Discussed/ Gave Hypoglycemia treatment plan.  # Consult : not required at this time.   # Annual urine for microalbuminuria/ creatinine ratio, no microalbuminuria currently, continue ACE/ARB /losartan.   Last  Lab Results  Component Value Date   MICRALBCREAT 6 04/25/2023    # Foot check nightly.  # Annual dilated diabetic eye exams.   - Diet: Make healthy diabetic food choices - Life style / activity / exercise: Discussed.  2. Blood pressure  -  BP Readings from Last 1 Encounters:  05/01/23 138/70    - Control is in target.  - No change in current plans.  3. Lipid status / Hyperlipidemia - Last  Lab Results  Component Value Date   LDLCALC 69 02/25/2023   - Continue rosuvastatin 40 mg daily.  Managed by primary care provider.  # Postablative hypothyroidism -Continue current dose of levothyroxine 100 mcg daily.  Recent thyroid lab normal.   Diagnoses and all orders for this visit:  Controlled type 2 diabetes mellitus without complication, without long-term current use of insulin (HCC)  Hypothyroidism, postablative  H/O Graves' disease    DISPOSITION Follow up in clinic in 4 months suggested.     All questions answered and patient verbalized understanding of the plan.  [place] Monaca Wadas, MD [facility] [address] [place], [address] [place], [place] [address] Phone # [phone]  At least part of this note was generated using voice recognition software. Inadvertent word errors may have occurred, which were not recognized during the proofreading process.

## 2023-05-11 ENCOUNTER — Other Ambulatory Visit: Payer: Self-pay | Admitting: Family Medicine

## 2023-05-18 LAB — AMB RESULTS CONSOLE CBG: Glucose: 106

## 2023-05-21 NOTE — Progress Notes (Signed)
 SDOH not assessed

## 2023-05-23 ENCOUNTER — Other Ambulatory Visit: Payer: Self-pay | Admitting: Family Medicine

## 2023-05-29 ENCOUNTER — Encounter: Payer: Self-pay | Admitting: Cardiology

## 2023-05-29 ENCOUNTER — Ambulatory Visit: Attending: Cardiology | Admitting: Cardiology

## 2023-05-29 VITALS — BP 122/74 | HR 72 | Ht 64.0 in | Wt 234.0 lb

## 2023-05-29 DIAGNOSIS — R9431 Abnormal electrocardiogram [ECG] [EKG]: Secondary | ICD-10-CM | POA: Diagnosis not present

## 2023-05-29 DIAGNOSIS — E782 Mixed hyperlipidemia: Secondary | ICD-10-CM | POA: Diagnosis not present

## 2023-05-29 DIAGNOSIS — I10 Essential (primary) hypertension: Secondary | ICD-10-CM

## 2023-05-29 NOTE — Progress Notes (Signed)
 Clinical Summary Ms. Dampier is a 83 y.o.female seen today for follow up of the following medical problems.   1. HTN - compliant with meds     2. Hyperlipidemia - labs followed by pcp - she is on crestor   02/2023 TC 171 TG 72 HDL 88 LDL 69   3. 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      Reports recent screening vascular US  with pcp,will request results Past Medical History:  Diagnosis Date   Allergy    Anxiety    Arthritis    Asthma    patient denies    Cataract    bilateral lens implants    Colon polyps    Depression    Diabetes mellitus    Type two   Diverticulitis    Double vision 2019   left eye   Family history of adverse reaction to anesthesia    daughter- N/V    Family history of ovarian cancer    Family history of pancreatic cancer    Frequency of urination    GERD (gastroesophageal reflux disease)    History of adverse reaction to anesthesia 03/19/2022   After regional block for TKA pt states she had uncontrollable frequent blinking and "zoned out" was unable to speak more than one or two words for about 10 minutes   History of hiatal hernia    Hyperglycemia    Hyperlipidemia    Hypertension    Hyperthyroidism    PT HAD BIPOSY -NEGATIVE.Aaron Aas AND NOW JUST GETS CHECKED EACH YEAR .Aaron Aas 2013 LAST TEST   SEES DR. [name].    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 DOES NOT USE C PAP   Palpitations    Peptic ulcer disease    Pneumonia    "walking pneumonia" hx of   Seizures (HCC) 2015   one siezure with brain surgery none since 2015    Sleep apnea    no CPAP   Urinary tract infection 03/2020   Vertigo      Allergies  Allergen Reactions   Flagyl  [Metronidazole ] Other (See Comments)    Caused increased heart rate   Aspirin Effervescent Swelling    Alka-Seltzer gel caps- sore mouth, face swollen, turned hands white   Bupivacaine  Other (See Comments)     Eye Batting really fast, Could not speak normally, Words were long   Morphine  Sulfate Hives    Hallucinations   Penicillins Swelling and Rash    Previously tolerated cephalexin , cefazolin  03/19/22     Current Outpatient Medications  Medication Sig Dispense Refill   ACCU-CHEK AVIVA PLUS test strip TEST BLOOD SUGAR TWICE DAILY. 50 strip 2   Accu-Chek Softclix Lancets lancets TEST BLOOD SUGAR TWICE DAILY. 100 each 0   acetaminophen  (TYLENOL ) 650 MG CR tablet Take 1,300 mg by mouth every morning.     amLODipine  (NORVASC ) 5 MG tablet TAKE ONE (1) TABLET EACH DAY 90 tablet 0   ascorbic acid (VITAMIN C) 500 MG tablet Take 500 mg by mouth daily.     cetirizine (ZYRTEC) 10 MG tablet Take 10 mg by mouth daily as needed for allergies.      docusate sodium  (COLACE) 100 MG capsule Take 1 capsule (100 mg total) by mouth 2 (two) times daily. 30 capsule 0   Flaxseed, Linseed, (FLAX SEED OIL) 1000 MG CAPS Take 1,000 mg by mouth daily.  HYDROcodone -acetaminophen  (NORCO/VICODIN) 5-325 MG tablet Take 1 tablet by mouth every 4 (four) hours as needed for moderate pain (pain score 4-6). 30 tablet 0   Iron-Vitamins (GERITOL COMPLETE PO) Take 1 tablet by mouth daily.     Lancet Devices (ACCU-CHEK SOFTCLIX) lancets 1 each by Other route 2 (two) times daily. E11.9 100 each 3   levETIRAcetam  (KEPPRA ) 500 MG tablet Take 1 tablet (500 mg total) by mouth 2 (two) times daily. 60 tablet 0   levothyroxine  (SYNTHROID ) 100 MCG tablet Take 1 tablet (100 mcg total) by mouth daily. 90 tablet 3   losartan -hydrochlorothiazide  (HYZAAR) 100-12.5 MG tablet TAKE ONE (1) TABLET EACH DAY 90 tablet 0   Magnesium  250 MG TABS Take 250 mg by mouth daily.     meclizine  (ANTIVERT ) 25 MG tablet Take 25 mg by mouth 3 (three) times daily as needed for dizziness.      metoprolol  succinate (TOPROL -XL) 50 MG 24 hr tablet TAKE ONE (1) TABLET BY MOUTH EVERY DAY 90 tablet 0   potassium chloride  (KLOR-CON ) 10 MEQ tablet TAKE ONE (1) TABLET EACH DAY  90 tablet 0   Propylene Glycol (SYSTANE BALANCE) 0.6 % SOLN Place 1 drop into both eyes daily as needed (dry eyes).     RABEprazole  (ACIPHEX ) 20 MG tablet TAKE ONE (1) TABLET EACH DAY 90 tablet 0   rosuvastatin  (CRESTOR ) 40 MG tablet TAKE ONE (1) TABLET EACH DAY 90 tablet 3   Sitaglipt Base-Metform HCl ER (ZITUVIMET XR) 50-500 MG TB24 Take 1 tablet by mouth daily. 90 tablet 3   SitaGLIPtin-MetFORMIN  HCl (JANUMET  XR) 50-500 MG TB24 TAKE ONE (1) TABLET EACH DAY (Patient not taking: Reported on 05/01/2023) 90 tablet 3   solifenacin  (VESICARE ) 5 MG tablet TAKE ONE (1) TABLET BY MOUTH EVERY DAY 90 tablet 0   valACYclovir  (VALTREX ) 1000 MG tablet Take two tablets at onset of cold sore and repeat two tablets in 12 hours. 30 tablet 1   No current facility-administered medications for this visit.     Past Surgical History:  Procedure Laterality Date   ABDOMINAL HYSTERECTOMY     ANTERIOR CERVICAL DECOMP/DISCECTOMY FUSION N/A 04/19/2021   Procedure: Anterior Cervical Discectomy and Fusion with Interbody Prosthesis, Plates/Screws Cervical Three-Four/Cervical Four-Five REMoval CERVICAL PLATE;  Surgeon: Garry Kansas, MD;  Location: [facility] OR;  Service: Neurosurgery;  Laterality: N/A;  3C   APPLICATION OF CRANIAL NAVIGATION Left 01/09/2023   Procedure: APPLICATION OF CRANIAL NAVIGATION;  Surgeon: Garry Kansas, MD;  Location: [facility] OR;  Service: Neurosurgery;  Laterality: Left;   BACK SURGERY     L SPIN 2003   NECK 2004   BRAIN MENINGIOMA EXCISION  08/2008   X 2   C5-C6 neck fusion     CATARACT EXTRACTION Bilateral    COLONOSCOPY  05/05/2009   pt states multiple polyps removed in both colonoscopies she has had   CRANIOTOMY  08/13/2011   Procedure: CRANIOTOMY TUMOR EXCISION;  Surgeon: Elder Greening, MD;  Location: [facility];  Service: Neurosurgery;  Laterality: Bilateral;  Bifrontal craniotomy for tumor   CRANIOTOMY Left 01/09/2023   Procedure: FRONTAL CRANIOTOMY;  Surgeon: Garry Kansas, MD;   Location: [facility] OR;  Service: Neurosurgery;  Laterality: Left;   DILATION AND CURETTAGE OF UTERUS     YEARS AGO   L4-L5 posterior la  2021   L3-L5 fusion   left foot plantar and hammertoe     right foot bunectomy     right knee arthroscopy     x 3  right shoulder rotator cuff     ROBOTIC ASSISTED BILATERAL SALPINGO OOPHERECTOMY N/A 08/25/2015   Procedure: XI ROBOTIC ASSISTED BILATERAL SALPINGO OOPHORECTOMY;  Surgeon: Terry Ficks, MD;  Location: [facility] ORS;  Service: Gynecology;  Laterality: N/A;   TONSILLECTOMY     TOTAL KNEE ARTHROPLASTY Right 03/19/2022   Procedure: TOTAL KNEE ARTHROPLASTY;  Surgeon: Liliane Rei, MD;  Location: [facility] ORS;  Service: Orthopedics;  Laterality: Right;   TUBAL LIGATION     WRIST SURGERY Right      Allergies  Allergen Reactions   Flagyl  [Metronidazole ] Other (See Comments)    Caused increased heart rate   Aspirin Effervescent Swelling    Alka-Seltzer gel caps- sore mouth, face swollen, turned hands white   Bupivacaine  Other (See Comments)    Eye Batting really fast, Could not speak normally, Words were long   Morphine  Sulfate Hives    Hallucinations   Penicillins Swelling and Rash    Previously tolerated cephalexin , cefazolin  03/19/22      Family History  Problem Relation Age of Onset   Ovarian cancer Mother    Heart disease Father 72   Pancreatic cancer Maternal Aunt    Diabetes Maternal Grandmother    Anesthesia problems Daughter        NAUSEA AND VOMITING POST OP   Arthritis Other    Hyperlipidemia Other    Hypertension Other    Diabetes Other    Breast cancer Neg Hx    Colon cancer Neg Hx    Colon polyps Neg Hx    Esophageal cancer Neg Hx    Stomach cancer Neg Hx    Rectal cancer Neg Hx      Social History Ms. Roda reports that she has never smoked. She has never used smokeless tobacco. Ms. Weathers reports no history of alcohol  use.   Physical Examination Today's Vitals   05/29/23 1350  BP: 122/74  Pulse: 72  SpO2: 98%   Weight: 234 lb (106.1 kg)  Height: 5\' 4"  (1.626 m)   Body mass index is 40.17 kg/m.  Gen: resting comfortably, no acute distress HEENT: no scleral icterus, pupils equal round and reactive, no palptable cervical adenopathy,  CV: RRR, no m/rg, no jvd Resp: Clear to auscultation bilaterally GI: abdomen is soft, non-tender, non-distended, normal bowel sounds, no hepatosplenomegaly MSK: extremities are warm, no edema.  Skin: warm, no rash Neuro:  no focal deficits Psych: appropriate affect   Diagnostic Studies  2013 echo [facility]: LVEF 60-65%, no WMAs, no significant valve pathology 2013 nuclear stress [facility]: no ischemia   Assessment and Plan   1.HTN -at goal, continue current meds  2. HLD - she is at goal, continue current meds   Initial EKG done question of lateral Qwaves, repeat EKG is normal and confirms this was due to lead misplacement.    F/u as needed Laurann Pollock, M.D.

## 2023-05-29 NOTE — Patient Instructions (Addendum)
 Medication Instructions:  Your physician recommends that you continue on your current medications as directed. Please refer to the Current Medication list given to you today.   Labwork: None  Testing/Procedures: None  Follow-Up: Your physician recommends that you schedule a follow-up appointment in: Follow up as needed  Any Other Special Instructions Will Be Listed Below (If Applicable). Thank you for choosing [facility]!     If you need a refill on your cardiac medications before your next appointment, please call your pharmacy.

## 2023-06-06 ENCOUNTER — Telehealth: Payer: Self-pay

## 2023-06-06 NOTE — Telephone Encounter (Signed)
-----   Message from Ace Holder sent at 06/05/2023  4:57 PM EDT ----- Regarding: RE: A note on a patient of yours Yes, that is fine.  Thank you. ----- Message ----- From: Alwyn Baas, CMA Sent: 06/05/2023   4:27 PM EDT To: Ace Holder, MD Subject: RE: A note on a patient of yours               Hello. You don't have any openings. Ok to see an APP in your Pod? ----- Message ----- From: Ace Holder, MD Sent: 06/04/2023   5:46 PM EDT To: Alwyn Baas, CMA Subject: FW: A note on a patient of yours               Jan can you book this patient a routine follow up with me to discuss potential colonoscopy. Thanks ----- Message ----- From: Albertina Hugger, MD Sent: 06/04/2023   3:28 PM EDT To: Ace Holder, MD Subject: A note on a patient of yours                   Siegfried Dress,  This nice lady's husband is a patient of mine and I saw him for a procedure today. She said I had done her previous colonoscopies and she believes she was due for a recall but she had not yet heard from anyone. I looked her up and it turns out she is your patient and had a lot of polyps on 2021 and 2022 colonoscopies and you were planning to bring her back in 3 years.  (Seems she mistook me for you) She may already be in the works under your recalls, but I just wanted you to know because she was asking.  FYI, she will turn 79 this year.  HD

## 2023-06-06 NOTE — Telephone Encounter (Signed)
 Great, thank you!

## 2023-06-06 NOTE — Telephone Encounter (Signed)
 Called and scheduled patient for OV with Colleen in June.

## 2023-07-01 ENCOUNTER — Ambulatory Visit (INDEPENDENT_AMBULATORY_CARE_PROVIDER_SITE_OTHER): Admitting: Nurse Practitioner

## 2023-07-01 ENCOUNTER — Encounter: Payer: Self-pay | Admitting: Nurse Practitioner

## 2023-07-01 VITALS — BP 116/62 | HR 88 | Ht 64.0 in | Wt 234.1 lb

## 2023-07-01 DIAGNOSIS — Z860100 Personal history of colon polyps, unspecified: Secondary | ICD-10-CM

## 2023-07-01 DIAGNOSIS — K5900 Constipation, unspecified: Secondary | ICD-10-CM | POA: Diagnosis not present

## 2023-07-01 DIAGNOSIS — Z860101 Personal history of adenomatous and serrated colon polyps: Secondary | ICD-10-CM

## 2023-07-01 DIAGNOSIS — E1122 Type 2 diabetes mellitus with diabetic chronic kidney disease: Secondary | ICD-10-CM

## 2023-07-01 DIAGNOSIS — K219 Gastro-esophageal reflux disease without esophagitis: Secondary | ICD-10-CM | POA: Diagnosis not present

## 2023-07-01 MED ORDER — NA SULFATE-K SULFATE-MG SULF 17.5-3.13-1.6 GM/177ML PO SOLN: 1.0000 | Freq: Once | ORAL | 0 refills | Status: AC

## 2023-07-01 NOTE — Progress Notes (Unsigned)
 07/01/2023 Dawn Salas [id number] May 16, 1940   CHIEF COMPLAINT: Discuss scheduling a colonoscopy   HISTORY OF PRESENT ILLNESS:    colon polyps. She is known by Dr. General Kenner.   She has occasional lower abdominal pain which she attributes 2 times weekly. Lasts 3 to 4 hours. Goes A BM.  She passes a BM 2 or 3 daily, might mss a d day or tow.  Diverticlitis 2 to 3 years ago, ED,  No GERD pr dus[ja/  She said I had done her previous colonoscopies and she believes she was due for a recall but she had not yet heard from anyone. I looked her up and it turns out she is your patient and had a lot of polyps on 2021 and 2022 colonoscopies and you were planning to bring her back in 3 years.  (Seems she mistook me for you) She may already be in the works under your recalls, but I just wanted you to know because she was asking.  FYI, she will turn 18 this year.    No familly hx of colon ca Mother had ovarian cancer        Latest Ref Rng & Units 01/03/2023    1:56 PM 08/09/2022   11:43 AM 03/20/2022    3:04 AM  CBC  WBC 4.0 - 10.5 K/uL 4.8  4.7  7.5   Hemoglobin 12.0 - 15.0 g/dL 40.9  81.1  9.4   Hematocrit 36.0 - 46.0 % 37.6  39.3  28.9   Platelets 150 - 400 K/uL 184  174.0  134         Latest Ref Rng & Units 04/25/2023    2:02 PM 02/25/2023    7:53 AM 01/03/2023    1:56 PM  CMP  Glucose 65 - 99 mg/dL 93  914  782   BUN 7 - 25 mg/dL 15  14  21    Creatinine 0.60 - 0.95 mg/dL 9.56  2.13  0.86   Sodium 135 - 146 mmol/L 136  140  134   Potassium 3.5 - 5.3 mmol/L 4.5  4.0  4.1   Chloride 98 - 110 mmol/L 98  104  100   CO2 20 - 32 mmol/L 26  26  24    Calcium  8.6 - 10.4 mg/dL 9.0  9.1  8.8   Total Protein 6.0 - 8.3 g/dL  6.7    Total Bilirubin 0.2 - 1.2 mg/dL  0.8    Alkaline Phos 39 - 117 U/L  90    AST 0 - 37 U/L  21    ALT 0 - 35 U/L  11           Past Medical History:  Diagnosis Date   Allergy    Anxiety    Arthritis    Asthma    patient denies    Cataract     bilateral lens implants    Colon polyps    Depression    Diabetes mellitus    Type two   Diverticulitis    Double vision 2019   left eye   Family history of adverse reaction to anesthesia    daughter- N/V    Family history of ovarian cancer    Family history of pancreatic cancer    Frequency of urination    GERD (gastroesophageal reflux disease)    History of adverse reaction to anesthesia 03/19/2022   After regional block for TKA pt states she had uncontrollable frequent blinking and "zoned  out" was unable to speak more than one or two words for about 10 minutes   History of hiatal hernia    Hyperglycemia    Hyperlipidemia    Hypertension    Hyperthyroidism    PT HAD BIPOSY -NEGATIVE.Aaron Aas AND NOW JUST GETS CHECKED EACH YEAR .Aaron Aas 2013 LAST TEST   SEES DR. [name].    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 DOES NOT USE C PAP   Palpitations    Peptic ulcer disease    Pneumonia    "walking pneumonia" hx of   Seizures (HCC) 2015   one siezure with brain surgery none since 2015    Sleep apnea    no CPAP   Urinary tract infection 03/2020   Vertigo    Past Surgical History:  Procedure Laterality Date   ABDOMINAL HYSTERECTOMY     ANTERIOR CERVICAL DECOMP/DISCECTOMY FUSION N/A 04/19/2021   Procedure: Anterior Cervical Discectomy and Fusion with Interbody Prosthesis, Plates/Screws Cervical Three-Four/Cervical Four-Five REMoval CERVICAL PLATE;  Surgeon: Garry Kansas, MD;  Location: [facility] OR;  Service: Neurosurgery;  Laterality: N/A;  3C   APPLICATION OF CRANIAL NAVIGATION Left 01/09/2023   Procedure: APPLICATION OF CRANIAL NAVIGATION;  Surgeon: Garry Kansas, MD;  Location: [facility] OR;  Service: Neurosurgery;  Laterality: Left;   BACK SURGERY     L SPIN 2003   NECK 2004   BRAIN MENINGIOMA EXCISION  08/2008   X 2   C5-C6 neck fusion     CATARACT EXTRACTION Bilateral    COLONOSCOPY  05/05/2009   pt states multiple polyps removed in  both colonoscopies she has had   CRANIOTOMY  08/13/2011   Procedure: CRANIOTOMY TUMOR EXCISION;  Surgeon: Elder Greening, MD;  Location: [facility];  Service: Neurosurgery;  Laterality: Bilateral;  Bifrontal craniotomy for tumor   CRANIOTOMY Left 01/09/2023   Procedure: FRONTAL CRANIOTOMY;  Surgeon: Garry Kansas, MD;  Location: [facility] OR;  Service: Neurosurgery;  Laterality: Left;   DILATION AND CURETTAGE OF UTERUS     YEARS AGO   L4-L5 posterior la  2021   L3-L5 fusion   left foot plantar and hammertoe     right foot bunectomy     right knee arthroscopy     x 3   right shoulder rotator cuff     ROBOTIC ASSISTED BILATERAL SALPINGO OOPHERECTOMY N/A 08/25/2015   Procedure: XI ROBOTIC ASSISTED BILATERAL SALPINGO OOPHORECTOMY;  Surgeon: Terry Ficks, MD;  Location: [facility] ORS;  Service: Gynecology;  Laterality: N/A;   TONSILLECTOMY     TOTAL KNEE ARTHROPLASTY Right 03/19/2022   Procedure: TOTAL KNEE ARTHROPLASTY;  Surgeon: Liliane Rei, MD;  Location: [facility] ORS;  Service: Orthopedics;  Laterality: Right;   TUBAL LIGATION     WRIST SURGERY Right    Social History:  Family History:    reports that she has never smoked. She has never used smokeless tobacco. She reports that she does not drink alcohol  and does not use drugs. family history includes Anesthesia problems in her daughter; Arthritis in an other family member; Diabetes in her maternal grandmother and another family member; Heart disease (age of onset: 63) in her father; Hyperlipidemia in an other family member; Hypertension in an other family member; Ovarian cancer in her mother; Pancreatic cancer in her maternal aunt. Allergies  Allergen Reactions   Flagyl  [Metronidazole ] Other (See Comments)    Caused increased heart rate   Aspirin Effervescent Swelling  Alka-Seltzer gel caps- sore mouth, face swollen, turned hands white   Bupivacaine  Other (See Comments)    Eye Batting really fast, Could not speak normally, Words were  long   Morphine  Sulfate Hives    Hallucinations   Penicillins Swelling and Rash    Previously tolerated cephalexin , cefazolin  03/19/22      Outpatient Encounter Medications as of 07/01/2023  Medication Sig   ACCU-CHEK AVIVA PLUS test strip TEST BLOOD SUGAR TWICE DAILY.   Accu-Chek Softclix Lancets lancets TEST BLOOD SUGAR TWICE DAILY.   acetaminophen  (TYLENOL ) 650 MG CR tablet Take 1,300 mg by mouth every morning.   amLODipine  (NORVASC ) 5 MG tablet TAKE ONE (1) TABLET EACH DAY   ascorbic acid (VITAMIN C) 500 MG tablet Take 500 mg by mouth daily.   cetirizine (ZYRTEC) 10 MG tablet Take 10 mg by mouth daily as needed for allergies.    docusate sodium  (COLACE) 100 MG capsule Take 1 capsule (100 mg total) by mouth 2 (two) times daily.   Flaxseed, Linseed, (FLAX SEED OIL) 1000 MG CAPS Take 1,000 mg by mouth daily.   HYDROcodone -acetaminophen  (NORCO/VICODIN) 5-325 MG tablet Take 1 tablet by mouth every 4 (four) hours as needed for moderate pain (pain score 4-6).   Iron-Vitamins (GERITOL COMPLETE PO) Take 1 tablet by mouth daily.   Lancet Devices (ACCU-CHEK SOFTCLIX) lancets 1 each by Other route 2 (two) times daily. E11.9   levETIRAcetam  (KEPPRA ) 500 MG tablet Take 1 tablet (500 mg total) by mouth 2 (two) times daily.   levothyroxine  (SYNTHROID ) 100 MCG tablet Take 1 tablet (100 mcg total) by mouth daily.   losartan -hydrochlorothiazide  (HYZAAR) 100-12.5 MG tablet TAKE ONE (1) TABLET EACH DAY   Magnesium  250 MG TABS Take 250 mg by mouth daily.   meclizine  (ANTIVERT ) 25 MG tablet Take 25 mg by mouth 3 (three) times daily as needed for dizziness.    metoprolol  succinate (TOPROL -XL) 50 MG 24 hr tablet TAKE ONE (1) TABLET BY MOUTH EVERY DAY   potassium chloride  (KLOR-CON ) 10 MEQ tablet TAKE ONE (1) TABLET EACH DAY   Propylene Glycol (SYSTANE BALANCE) 0.6 % SOLN Place 1 drop into both eyes daily as needed (dry eyes).   RABEprazole  (ACIPHEX ) 20 MG tablet TAKE ONE (1) TABLET EACH DAY   rosuvastatin   (CRESTOR ) 40 MG tablet TAKE ONE (1) TABLET EACH DAY   Sitaglipt Base-Metform HCl ER (ZITUVIMET XR) 50-500 MG TB24 Take 1 tablet by mouth daily.   SitaGLIPtin-MetFORMIN  HCl (JANUMET  XR) 50-500 MG TB24 TAKE ONE (1) TABLET EACH DAY   solifenacin  (VESICARE ) 5 MG tablet TAKE ONE (1) TABLET BY MOUTH EVERY DAY   valACYclovir  (VALTREX ) 1000 MG tablet Take two tablets at onset of cold sore and repeat two tablets in 12 hours.   [DISCONTINUED] venlafaxine  (EFFEXOR  XR) 37.5 MG 24 hr capsule Take 1 capsule (37.5 mg total) by mouth daily.   No facility-administered encounter medications on file as of 07/01/2023.     REVIEW OF SYSTEMS:  Gen: Denies fever, sweats or chills. No weight loss.  CV: Denies chest pain, palpitations or edema. Resp: Denies cough, shortness of breath of hemoptysis.  GI: Denies heartburn, dysphagia, stomach or lower abdominal pain. No diarrhea or constipation.  GU: Denies urinary burning, blood in urine, increased urinary frequency or incontinence. MS: Denies joint pain, muscles aches or weakness. Derm: Denies rash, itchiness, skin lesions or unhealing ulcers. Psych: Denies depression, anxiety, memory loss or confusion. Heme: Denies bruising, easy bleeding. Neuro:  Denies headaches, dizziness or paresthesias. Endo:  Denies any problems with DM, thyroid  or adrenal function.  PHYSICAL EXAM: There were no vitals taken for this visit. General: in no acute distress. Head: Normocephalic and atraumatic. Eyes:  Sclerae non-icteric, conjunctive pink. Ears: Normal auditory acuity. Mouth: Dentition intact. No ulcers or lesions.  Neck: Supple, no lymphadenopathy or thyromegaly.  Lungs: Clear bilaterally to auscultation without wheezes, crackles or rhonchi. Heart: Regular rate and rhythm. No murmur, rub or gallop appreciated.  Abdomen: Soft, nontender, nondistended. No masses. No hepatosplenomegaly. Normoactive bowel sounds x 4 quadrants.  Rectal:  Musculoskeletal: Symmetrical with no  gross deformities. Skin: Warm and dry. No rash or lesions on visible extremities. Extremities: No edema. Neurological: Alert oriented x 4, no focal deficits.  Psychological: Alert and cooperative. Normal mood and affect.  ASSESSMENT AND PLAN:    CC:  Burchette, Marijean Shouts, MD

## 2023-07-01 NOTE — Patient Instructions (Addendum)
 Please purchase Benefiber over the counter. Take 1 tablespoon once daily.  Take Miralax  at night as needed for constipation.  You have been scheduled for a colonoscopy. Please follow written instructions given to you at your visit today.   If you use inhalers (even only as needed), please bring them with you on the day of your procedure.  DO NOT TAKE 7 DAYS PRIOR TO TEST- Trulicity (dulaglutide) Ozempic, Wegovy (semaglutide) Mounjaro (tirzepatide) Bydureon Bcise (exanatide extended release)  DO NOT TAKE 1 DAY PRIOR TO YOUR TEST Rybelsus (semaglutide) Adlyxin (lixisenatide) Victoza (liraglutide) Byetta (exanatide) ___________________________________________________________________________   Thank you for trusting me with your gastrointestinal care!   Everett Hitt, CRNP    _______________________________________________________  If your blood pressure at your visit was 140/90 or greater, please contact your primary care physician to follow up on this.  _______________________________________________________  If you are age 30 or older, your body mass index should be between 23-30. Your Body mass index is 40.19 kg/m. If this is out of the aforementioned range listed, please consider follow up with your Primary Care Provider.  If you are age 43 or younger, your body mass index should be between 19-25. Your Body mass index is 40.19 kg/m. If this is out of the aformentioned range listed, please consider follow up with your Primary Care Provider.   ________________________________________________________  The [facility] [employer] providers would like to encourage you to use MYCHART to communicate with providers for non-urgent requests or questions.  Due to long hold times on the telephone, sending your provider a message by [facility] may be a faster and more efficient way to get a response.  Please allow 48 business hours for a response.  Please remember that this is for non-urgent  requests.  _______________________________________________________

## 2023-07-02 ENCOUNTER — Telehealth: Payer: Self-pay | Admitting: Nurse Practitioner

## 2023-07-02 NOTE — Telephone Encounter (Signed)
 Printed and mailed instructions to patient's address.

## 2023-07-02 NOTE — Progress Notes (Signed)
 Agree with assessment and plan as outlined.

## 2023-07-02 NOTE — Telephone Encounter (Signed)
 Patient called and stated that yesterday when she was leaving she stop by our bathroom on the 1 st floor and left her after visit summary in the bathroom. Patient is requesting we send to her address her after visit summary due to it containing information about her colonoscopy. Patient is requesting a call back. Please advise.

## 2023-07-03 ENCOUNTER — Other Ambulatory Visit: Payer: Self-pay | Admitting: Family Medicine

## 2023-07-18 ENCOUNTER — Other Ambulatory Visit: Payer: Self-pay | Admitting: Family Medicine

## 2023-08-03 ENCOUNTER — Other Ambulatory Visit: Payer: Self-pay | Admitting: Family Medicine

## 2023-08-09 NOTE — Telephone Encounter (Signed)
 Printed instruction for pick up.

## 2023-08-12 NOTE — Progress Notes (Signed)
 The patient attended a screening event on 05/18/2023, where her blood pressure was measured at 122/82 mmHg, and her non-fasting blood glucose was 106 mg/dl. During the event, the patient reported that she does not smoke, has [employer], is established with a primary care provider (Dr. Micheal), and did not complete the SDOH assessment.   A chart review confirmed that the patient has an active PCP, Dr. Wolm Median - [facility] [place]. Chart review further confirms that the pt has Medicare. No SDOHs needs were indicated. Chart review further indicates that the pt's most recent office visit was on 12/04/2022. She has an upcoming appt with her PCP on 08/12/2023 at 2:30 PM. The patient demonstrates consistent engagement in her care.   At this time, no additional support from the [facility] Team is indicated.

## 2023-08-13 ENCOUNTER — Ambulatory Visit: Payer: Self-pay | Admitting: Family Medicine

## 2023-08-13 ENCOUNTER — Encounter: Payer: Self-pay | Admitting: Family Medicine

## 2023-08-13 ENCOUNTER — Encounter: Payer: Self-pay | Admitting: Gastroenterology

## 2023-08-13 ENCOUNTER — Ambulatory Visit (INDEPENDENT_AMBULATORY_CARE_PROVIDER_SITE_OTHER): Admitting: Family Medicine

## 2023-08-13 VITALS — BP 138/80 | HR 84 | Temp 98.1°F | Wt 236.2 lb

## 2023-08-13 DIAGNOSIS — R252 Cramp and spasm: Secondary | ICD-10-CM | POA: Diagnosis not present

## 2023-08-13 DIAGNOSIS — R202 Paresthesia of skin: Secondary | ICD-10-CM

## 2023-08-13 DIAGNOSIS — E1122 Type 2 diabetes mellitus with diabetic chronic kidney disease: Secondary | ICD-10-CM | POA: Diagnosis not present

## 2023-08-13 LAB — BASIC METABOLIC PANEL WITH GFR
BUN: 13 mg/dL (ref 6–23)
CO2: 31 meq/L (ref 19–32)
Calcium: 9.1 mg/dL (ref 8.4–10.5)
Chloride: 99 meq/L (ref 96–112)
Creatinine, Ser: 1.17 mg/dL (ref 0.40–1.20)
GFR: 44.01 mL/min — ABNORMAL LOW (ref >60.00)
Glucose, Bld: 122 mg/dL — ABNORMAL HIGH (ref 70–99)
Potassium: 4.7 meq/L (ref 3.5–5.1)
Sodium: 138 meq/L (ref 135–145)

## 2023-08-13 LAB — MAGNESIUM: Magnesium: 1.8 mg/dL (ref 1.5–2.5)

## 2023-08-13 LAB — HEMOGLOBIN A1C: Hgb A1c MFr Bld: 7.1 % — ABNORMAL HIGH (ref 4.6–6.5)

## 2023-08-13 LAB — VITAMIN B12: Vitamin B-12: >1500 pg/mL — ABNORMAL HIGH (ref 211–911)

## 2023-08-13 NOTE — Progress Notes (Signed)
 Established Patient Office Visit  Subjective   Patient ID: Dawn Salas, female    DOB: 1940-02-04  Age: 83 y.o. MRN: [id number]  Chief Complaint  Patient presents with   Tingling    HPI   Dawn Salas is seen today with increased cramps feet and particularly hands past couple weeks.  She is taken some mustard which seems to help.  Feels like she is hydrating fairly well.  Takes magnesium  250 mg daily.  Also takes multivitamin which is Geritol and B12 supplement.  Has had history of hypokalemia in the past.  Does take losartan  HCTZ.  Also relates tingling sensation on the left lower leg mostly laterally.  None involving the right lower extremity and no tingling in the hands.  Denies any lower extremity weakness.  No claudication symptoms.  No increased edema.  Does have type 2 diabetes.  Has had some weight gain this year.  Currently not exercising.  Has done a lot of swimming in the past.  Past Medical History:  Diagnosis Date   Allergy    Anxiety    Arthritis    Asthma    patient denies    Cataract    bilateral lens implants    Colon polyps    Depression    Diabetes mellitus    Type two   Diverticulitis    Double vision 2019   left eye   Family history of adverse reaction to anesthesia    daughter- N/V    Family history of ovarian cancer    Family history of pancreatic cancer    Frequency of urination    GERD (gastroesophageal reflux disease)    History of adverse reaction to anesthesia 03/19/2022   After regional block for TKA pt states she had uncontrollable frequent blinking and zoned out was unable to speak more than one or two words for about 10 minutes   History of hiatal hernia    Hyperglycemia    Hyperlipidemia    Hypertension    Hyperthyroidism    PT HAD BIPOSY -NEGATIVE.SABRA AND NOW JUST GETS CHECKED EACH YEAR .SABRA 2013 LAST TEST   SEES DR. [name].    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 DOES NOT USE  C PAP   Palpitations    Peptic ulcer disease    Pneumonia    walking pneumonia hx of   Seizures (HCC) 2015   one siezure with brain surgery none since 2015    Sleep apnea    no CPAP   Urinary tract infection 03/2020   Vertigo    Past Surgical History:  Procedure Laterality Date   ABDOMINAL HYSTERECTOMY     ANTERIOR CERVICAL DECOMP/DISCECTOMY FUSION N/A 04/19/2021   Procedure: Anterior Cervical Discectomy and Fusion with Interbody Prosthesis, Plates/Screws Cervical Three-Four/Cervical Four-Five REMoval CERVICAL PLATE;  Surgeon: Mavis Purchase, MD;  Location: [facility] OR;  Service: Neurosurgery;  Laterality: N/A;  3C   APPLICATION OF CRANIAL NAVIGATION Left 01/09/2023   Procedure: APPLICATION OF CRANIAL NAVIGATION;  Surgeon: Mavis Purchase, MD;  Location: [facility] OR;  Service: Neurosurgery;  Laterality: Left;   BACK SURGERY     L SPIN 2003   NECK 2004   BRAIN MENINGIOMA EXCISION  08/2008   X 2   C5-C6 neck fusion     CATARACT EXTRACTION Bilateral    COLONOSCOPY  05/05/2009   pt states multiple polyps removed in both colonoscopies she has had  CRANIOTOMY  08/13/2011   Procedure: CRANIOTOMY TUMOR EXCISION;  Surgeon: Reyes JONETTA Budge, MD;  Location: [facility];  Service: Neurosurgery;  Laterality: Bilateral;  Bifrontal craniotomy for tumor   CRANIOTOMY Left 01/09/2023   Procedure: FRONTAL CRANIOTOMY;  Surgeon: Budge Reyes, MD;  Location: [facility] [place] OR;  Service: Neurosurgery;  Laterality: Left;   DILATION AND CURETTAGE OF UTERUS     YEARS AGO   L4-L5 posterior la  2021   L3-L5 fusion   left foot plantar and hammertoe     right foot bunectomy     right knee arthroscopy     x 3   right shoulder rotator cuff     ROBOTIC ASSISTED BILATERAL SALPINGO OOPHERECTOMY N/A 08/25/2015   Procedure: XI ROBOTIC ASSISTED BILATERAL SALPINGO OOPHORECTOMY;  Surgeon: Sari Bachelor, MD;  Location: [facility] ORS;  Service: Gynecology;  Laterality: N/A;   TONSILLECTOMY     TOTAL KNEE ARTHROPLASTY Right  03/19/2022   Procedure: TOTAL KNEE ARTHROPLASTY;  Surgeon: Melodi Lerner, MD;  Location: [facility] ORS;  Service: Orthopedics;  Laterality: Right;   TUBAL LIGATION     WRIST SURGERY Right     reports that she has never smoked. She has never used smokeless tobacco. She reports that she does not drink alcohol  and does not use drugs. family history includes Anesthesia problems in her daughter; Arthritis in an other family member; Diabetes in her maternal grandmother and another family member; Heart disease (age of onset: 51) in her father; Hyperlipidemia in an other family member; Hypertension in an other family member; Ovarian cancer in her mother; Pancreatic cancer in her maternal aunt. Allergies  Allergen Reactions   Flagyl  [Metronidazole ] Other (See Comments)    Caused increased heart rate   Aspirin Effervescent Swelling    Alka-Seltzer gel caps- sore mouth, face swollen, turned hands white   Bupivacaine  Other (See Comments)    Eye Batting really fast, Could not speak normally, Words were long   Morphine  Sulfate Hives    Hallucinations   Penicillins Swelling and Rash    Previously tolerated cephalexin , cefazolin  03/19/22    Review of Systems  Constitutional:  Negative for chills, fever and malaise/fatigue.  Eyes:  Negative for blurred vision.  Respiratory:  Negative for shortness of breath.   Cardiovascular:  Negative for chest pain.  Gastrointestinal:  Negative for abdominal pain.  Neurological:  Negative for dizziness, focal weakness, weakness and headaches.      Objective:     BP 138/80 (BP Location: Left Arm, Cuff Size: Large)   Pulse 84   Temp 98.1 F (36.7 C) (Oral)   Wt 236 lb 3.2 oz (107.1 kg)   SpO2 97%   BMI 40.54 kg/m  BP Readings from Last 3 Encounters:  08/13/23 138/80  07/01/23 116/62  05/29/23 122/74   Wt Readings from Last 3 Encounters:  08/13/23 236 lb 3.2 oz (107.1 kg)  07/01/23 234 lb 2 oz (106.2 kg)  05/29/23 234 lb (106.1 kg)      Physical  Exam Vitals reviewed.  Constitutional:      General: She is not in acute distress.    Appearance: She is well-developed. She is not ill-appearing.  Eyes:     Pupils: Pupils are equal, round, and reactive to light.  Neck:     Thyroid : No thyromegaly.     Vascular: No JVD.  Cardiovascular:     Rate and Rhythm: Normal rate and regular rhythm.     Heart sounds:     No gallop.  Comments: Foot is warm to touch with good capillary refill and easily palpable dorsalis pedis pulse. Pulmonary:     Effort: Pulmonary effort is normal. No respiratory distress.     Breath sounds: Normal breath sounds. No wheezing or rales.  Musculoskeletal:     Cervical back: Neck supple.     Right lower leg: No edema.     Left lower leg: No edema.  Neurological:     Mental Status: She is alert.     Comments: Left leg normal sensory function to touch.  Full strength.      Results for orders placed or performed in visit on 08/13/23  Hemoglobin A1c  Result Value Ref Range   Hgb A1c MFr Bld 7.1 (H) 4.6 - 6.5 %  Magnesium   Result Value Ref Range   Magnesium  1.8 1.5 - 2.5 mg/dL  Vitamin B12  Result Value Ref Range   Vitamin B-12 >1500 (H) 211 - 911 pg/mL  Basic metabolic panel with GFR  Result Value Ref Range   Sodium 138 135 - 145 mEq/L   Potassium 4.7 3.5 - 5.1 mEq/L   Chloride 99 96 - 112 mEq/L   CO2 31 19 - 32 mEq/L   Glucose, Bld 122 (H) 70 - 99 mg/dL   BUN 13 6 - 23 mg/dL   Creatinine, Ser 8.82 0.40 - 1.20 mg/dL   GFR 55.98 (L) >39.99 mL/min   Calcium  9.1 8.4 - 10.5 mg/dL      The ASCVD Risk score (Arnett DK, et al., 2019) failed to calculate for the following reasons:   The 2019 ASCVD risk score is only valid for ages 50 to 64    Assessment & Plan:   Problem List Items Addressed This Visit       Unprioritized   Type 2 diabetes mellitus, controlled (HCC) - Primary   Relevant Orders   Hemoglobin A1c (Completed)   Other Visit Diagnoses       Muscle cramps       Relevant  Orders   Basic metabolic panel with GFR (Completed)   Magnesium  (Completed)     Paresthesia       Relevant Orders   Vitamin B12 (Completed)     She has type 2 diabetes with history of fairly good control.  Recheck A1c  Describing muscle cramps mostly hands.  Discussed importance of good hydration.  She is already taking low-dose magnesium  supplement.  Will check basic metabolic panel and magnesium  level.  She has had some benefit from mustard and might consider turmeric supplement.  Mild paresthesia localized left leg.  History of B12 deficiency.  Recheck B12 level.  Watch for any weakness or progressive paresthesia  No follow-ups on file.    Wolm Scarlet, MD

## 2023-08-21 ENCOUNTER — Ambulatory Visit (AMBULATORY_SURGERY_CENTER): Admitting: Gastroenterology

## 2023-08-21 ENCOUNTER — Encounter: Payer: Self-pay | Admitting: Gastroenterology

## 2023-08-21 VITALS — BP 169/94 | HR 77 | Temp 97.2°F | Resp 14 | Ht 64.0 in | Wt 234.0 lb

## 2023-08-21 DIAGNOSIS — Z1211 Encounter for screening for malignant neoplasm of colon: Secondary | ICD-10-CM | POA: Diagnosis not present

## 2023-08-21 DIAGNOSIS — K5730 Diverticulosis of large intestine without perforation or abscess without bleeding: Secondary | ICD-10-CM

## 2023-08-21 DIAGNOSIS — Z860100 Personal history of colon polyps, unspecified: Secondary | ICD-10-CM

## 2023-08-21 DIAGNOSIS — K648 Other hemorrhoids: Secondary | ICD-10-CM

## 2023-08-21 MED ORDER — SODIUM CHLORIDE 0.9 % IV SOLN: 500.0000 mL | Freq: Once | INTRAVENOUS | Status: DC

## 2023-08-21 NOTE — Progress Notes (Signed)
 Pt's states no medical or surgical changes since previsit or office visit.

## 2023-08-21 NOTE — Op Note (Signed)
 [facility] Patient Name: Dawn Salas Procedure Date: 08/21/2023 11:03 AM MRN: [id number] Endoscopist: Elspeth P. Leigh , MD, [id number] Age: 83 Referring MD:  Date of Birth: Jan 09, 1941 Gender: Female Account #: [id number] Procedure:                Colonoscopy Indications:              High risk colon cancer surveillance: Personal                            history of colonic polyps - numerous polyps removed                            in the past, 2021 / 2022, negative genetic testing Medicines:                Monitored Anesthesia Care Procedure:                Pre-Anesthesia Assessment:                           - Prior to the procedure, a History and Physical                            was performed, and patient medications and                            allergies were reviewed. The patient's tolerance of                            previous anesthesia was also reviewed. The risks                            and benefits of the procedure and the sedation                            options and risks were discussed with the patient.                            All questions were answered, and informed consent                            was obtained. Prior Anticoagulants: The patient has                            taken no anticoagulant or antiplatelet agents. ASA                            Grade Assessment: III - A patient with severe                            systemic disease. After reviewing the risks and                            benefits, the patient was deemed in satisfactory  condition to undergo the procedure.                           After obtaining informed consent, the colonoscope                            was passed under direct vision. Throughout the                            procedure, the patient's blood pressure, Salas, and                            oxygen saturations were monitored continuously. The                             CF [id number] #[id number] was introduced through the anus                            and advanced to the the cecum, identified by                            appendiceal orifice and ileocecal valve. The                            colonoscopy was technically difficult and complex                            due to restricted mobility of the colon. The                            patient tolerated the procedure well. The quality                            of the bowel preparation was good. The ileocecal                            valve, appendiceal orifice, and rectum were                            photographed. Scope In: 11:17:32 AM Scope Out: 11:36:13 AM Scope Withdrawal Time: 0 hours 8 minutes 54 seconds  Total Procedure Duration: 0 hours 18 minutes 41 seconds  Findings:                 The perianal and digital rectal examinations were                            normal.                           Multiple diverticula were found in the transverse                            colon and left colon. This caused restricted  mobility in the left colon which was difficult to                            traverse. Water  immersion used to slowly navigate                            the left colon and traverse this area.                           Internal hemorrhoids were found.                           The exam was otherwise without abnormality. No                            polyps. Complications:            No immediate complications. Estimated blood loss:                            None. Estimated Blood Loss:     Estimated blood loss: none. Impression:               - Diverticulosis in the transverse colon and in the                            left colon, causing restricted mobility of the left                            colon.                           - Internal hemorrhoids.                           - The examination was otherwise normal.                           - No  polyps. Recommendation:           - Patient has a contact number available for                            emergencies. The signs and symptoms of potential                            delayed complications were discussed with the                            patient. Return to normal activities tomorrow.                            Written discharge instructions were provided to the                            patient.                           -  Resume previous diet.                           - Continue present medications.                           - Patient would not be due for colonoscopy for                            another 5 years given this finding. At that time                            she will be 83 years old, an age at which most                            people stop routine surveillance. Given no polyps                            on this exam, no further surveillance is                            recommended. Elspeth P. Rickie Gutierres, MD 08/21/2023 11:41:31 AM This report has been signed electronically.

## 2023-08-21 NOTE — Progress Notes (Signed)
 Report to PACU, RN, vss, BBS= Clear.

## 2023-08-21 NOTE — Patient Instructions (Addendum)
 Resume previous diet. Continue present medications.  Handouts provided on diverticulosis and hemorrhoids.   YOU HAD AN ENDOSCOPIC PROCEDURE TODAY AT THE [facility]:   Refer to the procedure report that was given to you for any specific questions about what was found during the examination.  If the procedure report does not answer your questions, please call your gastroenterologist to clarify.  If you requested that your care partner not be given the details of your procedure findings, then the procedure report has been included in a sealed envelope for you to review at your convenience later.  YOU SHOULD EXPECT: Some feelings of bloating in the abdomen. Passage of more gas than usual.  Walking can help get rid of the air that was put into your GI tract during the procedure and reduce the bloating. If you had a lower endoscopy (such as a colonoscopy or flexible sigmoidoscopy) you may notice spotting of blood in your stool or on the toilet paper. If you underwent a bowel prep for your procedure, you may not have a normal bowel movement for a few days.  Please Note:  You might notice some irritation and congestion in your nose or some drainage.  This is from the oxygen used during your procedure.  There is no need for concern and it should clear up in a day or so.  SYMPTOMS TO REPORT IMMEDIATELY:  Following lower endoscopy (colonoscopy or flexible sigmoidoscopy):  Excessive amounts of blood in the stool  Significant tenderness or worsening of abdominal pains  Swelling of the abdomen that is new, acute  Fever of 100F or higher  For urgent or emergent issues, a gastroenterologist can be reached at any hour by calling [phone]. Do not use MyChart messaging for urgent concerns.    DIET:  We do recommend a small meal at first, but then you may proceed to your regular diet.  Drink plenty of fluids but you should avoid alcoholic beverages for 24 hours.  ACTIVITY:  You should plan  to take it easy for the rest of today and you should NOT DRIVE or use heavy machinery until tomorrow (because of the sedation medicines used during the test).    FOLLOW UP: Our staff will call the number listed on your records the next business day following your procedure.  We will call around 7:15- 8:00 am to check on you and address any questions or concerns that you may have regarding the information given to you following your procedure. If we do not reach you, we will leave a message.     If any biopsies were taken you will be contacted by phone or by letter within the next 1-3 weeks.  Please call us  at [phone] if you have not heard about the biopsies in 3 weeks.    SIGNATURES/CONFIDENTIALITY: You and/or your care partner have signed paperwork which will be entered into your electronic medical record.  These signatures attest to the fact that that the information above on your After Visit Summary has been reviewed and is understood.  Full responsibility of the confidentiality of this discharge information lies with you and/or your care-partner.

## 2023-08-21 NOTE — Progress Notes (Signed)
 [facility] History and Physical   Primary Care Physician:  Micheal Wolm ORN, MD   Reason for Procedure:   History of colon polyps  Plan:    colonoscopy     HPI: Dawn Salas is a 83 y.o. female  here for colonoscopy surveillance - numerous polyps removed 2021 (14) and then another 8 polyps removed 05/2020. Negative genetic testing.   Patient denies any bowel symptoms at this time. No family history of colon cancer known. Otherwise feels well without any cardiopulmonary symptoms.   I have discussed risks / benefits of anesthesia and endoscopic procedure with Zoa E Peifer and they wish to proceed with the exams as outlined today.    Past Medical History:  Diagnosis Date   Allergy    Anxiety    Arthritis    Asthma    patient denies    Cataract    bilateral lens implants    Colon polyps    Depression    Diabetes mellitus    Type two   Diverticulitis    Double vision 2019   left eye   Family history of adverse reaction to anesthesia    daughter- N/V    Family history of ovarian cancer    Family history of pancreatic cancer    Frequency of urination    GERD (gastroesophageal reflux disease)    History of adverse reaction to anesthesia 03/19/2022   After regional block for TKA pt states she had uncontrollable frequent blinking and zoned out was unable to speak more than one or two words for about 10 minutes   History of hiatal hernia    Hyperglycemia    Hyperlipidemia    Hypertension    Hyperthyroidism    PT HAD BIPOSY -NEGATIVE.SABRA AND NOW JUST GETS CHECKED EACH YEAR .SABRA 2013 LAST TEST   SEES DR. [name].    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 DOES NOT USE C PAP   Palpitations    Peptic ulcer disease    Pneumonia    walking pneumonia hx of   Seizures (HCC) 2015   one siezure with brain surgery none since 2015    Sleep apnea    no CPAP   Urinary tract infection 03/2020   Vertigo      Past Surgical History:  Procedure Laterality Date   ABDOMINAL HYSTERECTOMY     ANTERIOR CERVICAL DECOMP/DISCECTOMY FUSION N/A 04/19/2021   Procedure: Anterior Cervical Discectomy and Fusion with Interbody Prosthesis, Plates/Screws Cervical Three-Four/Cervical Four-Five REMoval CERVICAL PLATE;  Surgeon: Mavis Purchase, MD;  Location: [facility] OR;  Service: Neurosurgery;  Laterality: N/A;  3C   APPLICATION OF CRANIAL NAVIGATION Left 01/09/2023   Procedure: APPLICATION OF CRANIAL NAVIGATION;  Surgeon: Mavis Purchase, MD;  Location: [facility] OR;  Service: Neurosurgery;  Laterality: Left;   BACK SURGERY     L SPIN 2003   NECK 2004   BRAIN MENINGIOMA EXCISION  08/2008   X 2   C5-C6 neck fusion     CATARACT EXTRACTION Bilateral    COLONOSCOPY  05/05/2009   pt states multiple polyps removed in both colonoscopies she has had   CRANIOTOMY  08/13/2011   Procedure: CRANIOTOMY TUMOR EXCISION;  Surgeon: Purchase JONETTA Mavis, MD;  Location: [facility];  Service: Neurosurgery;  Laterality: Bilateral;  Bifrontal craniotomy for tumor   CRANIOTOMY Left 01/09/2023   Procedure: FRONTAL CRANIOTOMY;  Surgeon: Mavis Purchase, MD;  Location: [facility] OR;  Service: Neurosurgery;  Laterality: Left;   DILATION AND CURETTAGE OF UTERUS     YEARS AGO   L4-L5 posterior la  2021   L3-L5 fusion   left foot plantar and hammertoe     right foot bunectomy     right knee arthroscopy     x 3   right shoulder rotator cuff     ROBOTIC ASSISTED BILATERAL SALPINGO OOPHERECTOMY N/A 08/25/2015   Procedure: XI ROBOTIC ASSISTED BILATERAL SALPINGO OOPHORECTOMY;  Surgeon: Sari Bachelor, MD;  Location: [facility] ORS;  Service: Gynecology;  Laterality: N/A;   TONSILLECTOMY     TOTAL KNEE ARTHROPLASTY Right 03/19/2022   Procedure: TOTAL KNEE ARTHROPLASTY;  Surgeon: Melodi Lerner, MD;  Location: [facility] ORS;  Service: Orthopedics;  Laterality: Right;   TUBAL LIGATION     WRIST SURGERY Right     Prior to Admission medications   Medication Sig Start  Date End Date Taking? Authorizing Provider  ACCU-CHEK AVIVA PLUS test strip TEST BLOOD SUGAR TWICE DAILY. 12/12/22  Yes Burchette, Wolm ORN, MD  Accu-Chek Softclix Lancets lancets TEST BLOOD SUGAR TWICE DAILY. 02/07/23  Yes Burchette, Wolm ORN, MD  amLODipine  (NORVASC ) 5 MG tablet TAKE ONE (1) TABLET EACH DAY 08/05/23  Yes Burchette, Wolm ORN, MD  Lancet Devices [facility]) lancets 1 each by Other route 2 (two) times daily. E11.9 03/31/15  Yes Burchette, Wolm ORN, MD  losartan -hydrochlorothiazide  (HYZAAR) 100-12.5 MG tablet TAKE ONE (1) TABLET EACH DAY 07/19/23  Yes Burchette, Wolm ORN, MD  meclizine  (ANTIVERT ) 25 MG tablet Take 25 mg by mouth 3 (three) times daily as needed for dizziness.    Yes [provider]  metoprolol  succinate (TOPROL -XL) 50 MG 24 hr tablet TAKE ONE (1) TABLET BY MOUTH EVERY DAY 05/23/23  Yes Burchette, Wolm ORN, MD  RABEprazole  (ACIPHEX ) 20 MG tablet TAKE ONE (1) TABLET EACH DAY 07/19/23  Yes Burchette, Wolm ORN, MD  rosuvastatin  (CRESTOR ) 40 MG tablet TAKE ONE (1) TABLET EACH DAY 02/22/23  Yes Burchette, Wolm ORN, MD  Sitaglipt Base-Metform HCl ER (ZITUVIMET  XR) 50-500 MG TB24 Take 1 tablet by mouth daily. 02/01/23  Yes Thapa, [place], MD  solifenacin  (VESICARE ) 5 MG tablet TAKE ONE (1) TABLET BY MOUTH EVERY DAY 10/17/22  Yes Burchette, Wolm ORN, MD  acetaminophen  (TYLENOL ) 650 MG CR tablet Take 1,300 mg by mouth every morning.    [provider]  ascorbic acid (VITAMIN C) 500 MG tablet Take 500 mg by mouth daily.    [provider]  cetirizine (ZYRTEC) 10 MG tablet Take 10 mg by mouth daily as needed for allergies.     [provider]  Flaxseed, Linseed, (FLAX SEED OIL) 1000 MG CAPS Take 1,000 mg by mouth daily.    [provider]  Iron-Vitamins (GERITOL COMPLETE PO) Take 1 tablet by mouth daily.    [provider]  levETIRAcetam  (KEPPRA ) 500 MG tablet Take 1 tablet (500 mg total) by mouth 2 (two) times daily. Patient not taking:  Reported on 08/21/2023 01/11/23   Mavis Purchase, MD  levothyroxine  (SYNTHROID ) 100 MCG tablet Take 1 tablet (100 mcg total) by mouth daily. 01/30/23   Thapa, [place], MD  Magnesium  250 MG TABS Take 250 mg by mouth daily.    [provider]  potassium chloride  (KLOR-CON ) 10 MEQ tablet TAKE ONE (1) TABLET EACH DAY 07/03/23   Burchette, Wolm ORN, MD  Propylene Glycol (SYSTANE BALANCE) 0.6 % SOLN Place 1 drop into both eyes daily as needed (dry eyes).  [provider]  valACYclovir  (VALTREX ) 1000 MG tablet Take two tablets at onset of cold sore and repeat two tablets in 12 hours. 03/15/23   Burchette, Wolm ORN, MD  venlafaxine  (EFFEXOR  XR) 37.5 MG 24 hr capsule Take 1 capsule (37.5 mg total) by mouth daily. 02/15/11 04/11/11  Micheal Wolm ORN, MD    Current Outpatient Medications  Medication Sig Dispense Refill   ACCU-CHEK AVIVA PLUS test strip TEST BLOOD SUGAR TWICE DAILY. 50 strip 2   Accu-Chek Softclix Lancets lancets TEST BLOOD SUGAR TWICE DAILY. 100 each 0   amLODipine  (NORVASC ) 5 MG tablet TAKE ONE (1) TABLET EACH DAY 30 tablet 0   Lancet Devices (ACCU-CHEK SOFTCLIX) lancets 1 each by Other route 2 (two) times daily. E11.9 100 each 3   losartan -hydrochlorothiazide  (HYZAAR) 100-12.5 MG tablet TAKE ONE (1) TABLET EACH DAY 90 tablet 0   meclizine  (ANTIVERT ) 25 MG tablet Take 25 mg by mouth 3 (three) times daily as needed for dizziness.      metoprolol  succinate (TOPROL -XL) 50 MG 24 hr tablet TAKE ONE (1) TABLET BY MOUTH EVERY DAY 90 tablet 0   RABEprazole  (ACIPHEX ) 20 MG tablet TAKE ONE (1) TABLET EACH DAY 90 tablet 0   rosuvastatin  (CRESTOR ) 40 MG tablet TAKE ONE (1) TABLET EACH DAY 90 tablet 3   Sitaglipt Base-Metform HCl ER (ZITUVIMET  XR) 50-500 MG TB24 Take 1 tablet by mouth daily. 90 tablet 3   solifenacin  (VESICARE ) 5 MG tablet TAKE ONE (1) TABLET BY MOUTH EVERY DAY 90 tablet 0   acetaminophen  (TYLENOL ) 650 MG CR tablet Take 1,300 mg by mouth every morning.     ascorbic acid  (VITAMIN C) 500 MG tablet Take 500 mg by mouth daily.     cetirizine (ZYRTEC) 10 MG tablet Take 10 mg by mouth daily as needed for allergies.      Flaxseed, Linseed, (FLAX SEED OIL) 1000 MG CAPS Take 1,000 mg by mouth daily.     Iron-Vitamins (GERITOL COMPLETE PO) Take 1 tablet by mouth daily.     levETIRAcetam  (KEPPRA ) 500 MG tablet Take 1 tablet (500 mg total) by mouth 2 (two) times daily. (Patient not taking: Reported on 08/21/2023) 60 tablet 0   levothyroxine  (SYNTHROID ) 100 MCG tablet Take 1 tablet (100 mcg total) by mouth daily. 90 tablet 3   Magnesium  250 MG TABS Take 250 mg by mouth daily.     potassium chloride  (KLOR-CON ) 10 MEQ tablet TAKE ONE (1) TABLET EACH DAY 90 tablet 0   Propylene Glycol (SYSTANE BALANCE) 0.6 % SOLN Place 1 drop into both eyes daily as needed (dry eyes).     valACYclovir  (VALTREX ) 1000 MG tablet Take two tablets at onset of cold sore and repeat two tablets in 12 hours. 30 tablet 1   Current Facility-Administered Medications  Medication Dose Route Frequency Provider Last Rate Last Admin   0.9 %  sodium chloride  infusion  500 mL Intravenous Once Jaquay Posthumus, Elspeth SQUIBB, MD        Allergies as of 08/21/2023 - Review Complete 08/21/2023  Allergen Reaction Noted   Aspirin effervescent Swelling    Flagyl  [metronidazole ] Other (See Comments) 07/27/2011   Bupivacaine  Other (See Comments) 12/31/2022   Morphine  sulfate Hives 12/06/2008   Penicillins Swelling and Rash     Family History  Problem Relation Age of Onset   Ovarian cancer Mother    Heart disease Father 56   Pancreatic cancer Maternal Aunt    Diabetes Maternal Grandmother    Anesthesia problems Daughter  NAUSEA AND VOMITING POST OP   Arthritis Other    Hyperlipidemia Other    Hypertension Other    Diabetes Other    Breast cancer Neg Hx    Colon cancer Neg Hx    Colon polyps Neg Hx    Esophageal cancer Neg Hx    Stomach cancer Neg Hx    Rectal cancer Neg Hx     Social History    Socioeconomic History   Marital status: Married    Spouse name: Not on file   Number of children: 6   Years of education: Not on file   Highest education level: Not on file  Occupational History   Not on file  Tobacco Use   Smoking status: Never   Smokeless tobacco: Never  Vaping Use   Vaping status: Never Used  Substance and Sexual Activity   Alcohol  use: No   Drug use: No   Sexual activity: Never  Other Topics Concern   Not on file  Social History Narrative   Not on file   Social Drivers of Health   Financial Resource Strain: Low Risk  (11/19/2022)   Overall Financial Resource Strain (CARDIA)    Difficulty of Paying Living Expenses: Not hard at all  Food Insecurity: No Food Insecurity (01/14/2023)   Hunger Vital Sign    Worried About Running Out of Food in the Last Year: Never true    Ran Out of Food in the Last Year: Never true  Transportation Needs: No Transportation Needs (01/14/2023)   PRAPARE - Administrator, Civil Service (Medical): No    Lack of Transportation (Non-Medical): No  Physical Activity: Insufficiently Active (11/19/2022)   Exercise Vital Sign    Days of Exercise per Week: 7 days    Minutes of Exercise per Session: 20 min  Stress: No Stress Concern Present (11/19/2022)   [employer] of Occupational Health - Occupational Stress Questionnaire    Feeling of Stress : Not at all  Social Connections: Socially Integrated (11/19/2022)   Social Connection and Isolation Panel    Frequency of Communication with Friends and Family: More than three times a week    Frequency of Social Gatherings with Friends and Family: More than three times a week    Attends Religious Services: More than 4 times per year    Active Member of [employer] or Organizations: Yes    Attends Engineer, structural: More than 4 times per year    Marital Status: Married  Catering manager Violence: Not At Risk (01/14/2023)   Humiliation, Afraid, Rape, and Kick  questionnaire    Fear of Current or Ex-Partner: No    Emotionally Abused: No    Physically Abused: No    Sexually Abused: No    Review of Systems: All other review of systems negative except as mentioned in the HPI.  Physical Exam: Vital signs BP (!) 161/74   Pulse 80   Temp (!) 97.2 F (36.2 C)   Ht 5' 4 (1.626 m)   Wt 234 lb (106.1 kg)   SpO2 99%   BMI 40.17 kg/m   General:   Alert,  Well-developed, pleasant and cooperative in NAD Lungs:  Clear throughout to auscultation.   Heart:  Regular rate and rhythm Abdomen:  Soft, nontender and nondistended.   Neuro/Psych:  Alert and cooperative. Normal mood and affect. A and O x 3  Marcey Naval, MD [facility]

## 2023-08-22 ENCOUNTER — Telehealth: Payer: Self-pay

## 2023-08-22 NOTE — Telephone Encounter (Signed)
  Follow up Call-     08/21/2023   10:17 AM  Call back number  Post procedure Call Back phone  # [phone]  Permission to leave phone message Yes     Patient questions:  Do you have a fever, pain , or abdominal swelling? No. Pain Score  0 *  Have you tolerated food without any problems? Yes.    Have you been able to return to your normal activities? Yes.    Do you have any questions about your discharge instructions: Diet   No. Medications  No. Follow up visit  No.  Do you have questions or concerns about your Care? No.  Actions: * If pain score is 4 or above: No action needed, pain <4.

## 2023-08-23 ENCOUNTER — Other Ambulatory Visit: Payer: Self-pay | Admitting: Family Medicine

## 2023-09-02 ENCOUNTER — Encounter: Payer: Self-pay | Admitting: Endocrinology

## 2023-09-02 ENCOUNTER — Ambulatory Visit (INDEPENDENT_AMBULATORY_CARE_PROVIDER_SITE_OTHER): Admitting: Endocrinology

## 2023-09-02 VITALS — BP 118/72 | HR 76 | Resp 16 | Ht 64.0 in | Wt 237.2 lb

## 2023-09-02 DIAGNOSIS — E119 Type 2 diabetes mellitus without complications: Secondary | ICD-10-CM | POA: Diagnosis not present

## 2023-09-02 DIAGNOSIS — Z8639 Personal history of other endocrine, nutritional and metabolic disease: Secondary | ICD-10-CM

## 2023-09-02 DIAGNOSIS — E890 Postprocedural hypothyroidism: Secondary | ICD-10-CM | POA: Diagnosis not present

## 2023-09-02 DIAGNOSIS — Z7984 Long term (current) use of oral hypoglycemic drugs: Secondary | ICD-10-CM

## 2023-09-02 NOTE — Progress Notes (Signed)
 Outpatient Endocrinology Note [place] Viliami Bracco, MD  09/02/23  Patient's Name: Dawn Salas    DOB: 10-22-1940    MRN: [id number]                                                    REASON OF VISIT: Follow up for type 2 diabetes mellitus / hypothyroidism  PCP: Micheal Wolm ORN, MD  HISTORY OF PRESENT ILLNESS:   Dawn Salas is a 83 y.o. 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   Pertinent Diabetes History: Patient has longstanding history of type 2 diabetes mellitus with mild obesity.  She has controlled type 2 diabetes mellitus.  Chronic Diabetes Complications : Retinopathy: no. Last ophthalmology exam was done on annually, reportedly.  Nephropathy: no, on losartan  Peripheral neuropathy: no Coronary artery disease: no Stroke: no  Relevant comorbidities and cardiovascular risk factors: Obesity: yes Body mass index is 40.72 kg/m.  Hypertension: yes Hyperlipidemia. Yes, on statin.   Current / Home Diabetic regimen includes:  Zituvimet   (  saxagliptin/metformin )  XR 50/500 1 tablet daily.  Prior diabetic medications: Metformin  alone in the past.  Janumet  switched to Zituvimet  for insurance formulary.  Glycemic data:   She has been checking blood sugar at different times of the day, glucose data reviewed directly from the meter some of the blood sugar are 106, 119, 118, 119, 131, 112, 100, 130, 129, 118, 171, 78.  Hypoglycemia: Patient has no hypoglycemic episodes. Patient has hypoglycemia awareness.  She feels hypoglycemic symptoms when blood sugar is below 90.  # Postablative hypothyroidism -Patient had multinodular goiter evaluated with fine-needle aspiration in September 2012.  She was subsequently diagnosed with having hyperthyroidism in 2015 when she had palpitations and had a goiter.  At that time she was having symptoms of fatigue and shakiness and palpitation, heat intolerance  and and some weight loss.  She was treated with I-131 unknown dose radioactive iodine  ablation in 2015 however subsequently her hyperthyroidism reoccurred and methimazole  was restarted in ?  2017.  Due to persistent significant hyperthyroidism and frequently elevated thyrotropin receptor antibodies she was treated with RAI I-131 with 20.3 mCi on Jun 04, 2016.  Patient had Graves' disease status post RAI ablation x 2 , now postablative hypothyroidism.  Patient is currently on thyroid  hormone replacement /levothyroxine  and dose had been adjusted multiple times in the past.  In July 2022.  Patient had TSH 10, levothyroxine  dose was increased from 75 to 100 mcg daily.   Taking levothyroxine  100 mcg daily.  Graves' ophthalmopathy:  History of diplopia treated by ophthalmologist with wearing prism  lenses. Has some tearing of the eyes and has been given drops by the ophthalmologist. She is followed by ophthalmologist regularly Previous MRI had shown mild thickening of the rectus muscle on the left in 2018.  Interval history  Diabetes regimen as reviewed and noted above.  Hemoglobin A1c 7.1%.  She occasionally complains of  tingling in the left foot otherwise no complaints today.  Glucometer data as reviewed above.  No hypo and hyperthyroid symptoms, currently taking levothyroxine  100 mcg daily.  REVIEW OF SYSTEMS As per history of present illness.   PAST MEDICAL HISTORY: Past Medical History:  Diagnosis Date   Allergy    Anxiety  Arthritis    Asthma    patient denies    Cataract    bilateral lens implants    Colon polyps    Depression    Diabetes mellitus    Type two   Diverticulitis    Double vision 2019   left eye   Family history of adverse reaction to anesthesia    daughter- N/V    Family history of ovarian cancer    Family history of pancreatic cancer    Frequency of urination    GERD (gastroesophageal reflux disease)    History of adverse reaction to anesthesia 03/19/2022    After regional block for TKA pt states she had uncontrollable frequent blinking and zoned out was unable to speak more than one or two words for about 10 minutes   History of hiatal hernia    Hyperglycemia    Hyperlipidemia    Hypertension    Hyperthyroidism    PT HAD BIPOSY -NEGATIVE.SABRA AND NOW JUST GETS CHECKED EACH YEAR .SABRA 2013 LAST TEST   SEES DR. [name].    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 DOES NOT USE C PAP   Palpitations    Peptic ulcer disease    Pneumonia    walking pneumonia hx of   Seizures (HCC) 2015   one siezure with brain surgery none since 2015    Sleep apnea    no CPAP   Urinary tract infection 03/2020   Vertigo     PAST SURGICAL HISTORY: Past Surgical History:  Procedure Laterality Date   ABDOMINAL HYSTERECTOMY     ANTERIOR CERVICAL DECOMP/DISCECTOMY FUSION N/A 04/19/2021   Procedure: Anterior Cervical Discectomy and Fusion with Interbody Prosthesis, Plates/Screws Cervical Three-Four/Cervical Four-Five REMoval CERVICAL PLATE;  Surgeon: Mavis Purchase, MD;  Location: [facility] OR;  Service: Neurosurgery;  Laterality: N/A;  3C   APPLICATION OF CRANIAL NAVIGATION Left 01/09/2023   Procedure: APPLICATION OF CRANIAL NAVIGATION;  Surgeon: Mavis Purchase, MD;  Location: [facility] OR;  Service: Neurosurgery;  Laterality: Left;   BACK SURGERY     L SPIN 2003   NECK 2004   BRAIN MENINGIOMA EXCISION  08/2008   X 2   C5-C6 neck fusion     CATARACT EXTRACTION Bilateral    COLONOSCOPY  05/05/2009   pt states multiple polyps removed in both colonoscopies she has had   CRANIOTOMY  08/13/2011   Procedure: CRANIOTOMY TUMOR EXCISION;  Surgeon: Purchase JONETTA Mavis, MD;  Location: [facility];  Service: Neurosurgery;  Laterality: Bilateral;  Bifrontal craniotomy for tumor   CRANIOTOMY Left 01/09/2023   Procedure: FRONTAL CRANIOTOMY;  Surgeon: Mavis Purchase, MD;  Location: [facility] OR;  Service: Neurosurgery;  Laterality: Left;    DILATION AND CURETTAGE OF UTERUS     YEARS AGO   L4-L5 posterior la  2021   L3-L5 fusion   left foot plantar and hammertoe     right foot bunectomy     right knee arthroscopy     x 3   right shoulder rotator cuff     ROBOTIC ASSISTED BILATERAL SALPINGO OOPHERECTOMY N/A 08/25/2015   Procedure: XI ROBOTIC ASSISTED BILATERAL SALPINGO OOPHORECTOMY;  Surgeon: Sari Bachelor, MD;  Location: [facility] ORS;  Service: Gynecology;  Laterality: N/A;   TONSILLECTOMY     TOTAL KNEE ARTHROPLASTY Right 03/19/2022   Procedure: TOTAL KNEE ARTHROPLASTY;  Surgeon: Melodi Lerner, MD;  Location: [facility] ORS;  Service: Orthopedics;  Laterality: Right;  TUBAL LIGATION     WRIST SURGERY Right     ALLERGIES: Allergies  Allergen Reactions   Aspirin Effervescent Swelling    Alka-Seltzer gel caps- sore mouth, face swollen, turned hands white   Flagyl  [Metronidazole ] Other (See Comments)    Caused increased heart rate   Bupivacaine  Other (See Comments)    Eye Batting really fast, Could not speak normally, Words were long   Morphine  Sulfate Hives    Hallucinations   Penicillins Swelling and Rash    Previously tolerated cephalexin , cefazolin  03/19/22    FAMILY HISTORY:  Family History  Problem Relation Age of Onset   Ovarian cancer Mother    Heart disease Father 60   Pancreatic cancer Maternal Aunt    Diabetes Maternal Grandmother    Anesthesia problems Daughter        NAUSEA AND VOMITING POST OP   Arthritis Other    Hyperlipidemia Other    Hypertension Other    Diabetes Other    Breast cancer Neg Hx    Colon cancer Neg Hx    Colon polyps Neg Hx    Esophageal cancer Neg Hx    Stomach cancer Neg Hx    Rectal cancer Neg Hx     SOCIAL HISTORY: Social History   Socioeconomic History   Marital status: Married    Spouse name: Not on file   Number of children: 6   Years of education: Not on file   Highest education level: Not on file  Occupational History   Not on file  Tobacco Use   Smoking  status: Never   Smokeless tobacco: Never  Vaping Use   Vaping status: Never Used  Substance and Sexual Activity   Alcohol  use: No   Drug use: No   Sexual activity: Never  Other Topics Concern   Not on file  Social History Narrative   Not on file   Social Drivers of Health   Financial Resource Strain: Low Risk  (11/19/2022)   Overall Financial Resource Strain (CARDIA)    Difficulty of Paying Living Expenses: Not hard at all  Food Insecurity: No Food Insecurity (01/14/2023)   Hunger Vital Sign    Worried About Running Out of Food in the Last Year: Never true    Ran Out of Food in the Last Year: Never true  Transportation Needs: No Transportation Needs (01/14/2023)   PRAPARE - Administrator, Civil Service (Medical): No    Lack of Transportation (Non-Medical): No  Physical Activity: Insufficiently Active (11/19/2022)   Exercise Vital Sign    Days of Exercise per Week: 7 days    Minutes of Exercise per Session: 20 min  Stress: No Stress Concern Present (11/19/2022)   [employer] of Occupational Health - Occupational Stress Questionnaire    Feeling of Stress : Not at all  Social Connections: Socially Integrated (11/19/2022)   Social Connection and Isolation Panel    Frequency of Communication with Friends and Family: More than three times a week    Frequency of Social Gatherings with Friends and Family: More than three times a week    Attends Religious Services: More than 4 times per year    Active Member of [employer] or Organizations: Yes    Attends Engineer, structural: More than 4 times per year    Marital Status: Married    MEDICATIONS:  Current Outpatient Medications  Medication Sig Dispense Refill   ACCU-CHEK AVIVA PLUS test strip TEST BLOOD SUGAR TWICE DAILY.  50 strip 2   Accu-Chek Softclix Lancets lancets TEST BLOOD SUGAR TWICE DAILY. 100 each 0   acetaminophen  (TYLENOL ) 650 MG CR tablet Take 1,300 mg by mouth every morning.     amLODipine   (NORVASC ) 5 MG tablet TAKE ONE (1) TABLET EACH DAY 30 tablet 0   ascorbic acid (VITAMIN C) 500 MG tablet Take 500 mg by mouth daily.     cetirizine (ZYRTEC) 10 MG tablet Take 10 mg by mouth daily as needed for allergies.      Flaxseed, Linseed, (FLAX SEED OIL) 1000 MG CAPS Take 1,000 mg by mouth daily.     Iron-Vitamins (GERITOL COMPLETE PO) Take 1 tablet by mouth daily.     Lancet Devices (ACCU-CHEK SOFTCLIX) lancets 1 each by Other route 2 (two) times daily. E11.9 100 each 3   levothyroxine  (SYNTHROID ) 100 MCG tablet Take 1 tablet (100 mcg total) by mouth daily. 90 tablet 3   losartan -hydrochlorothiazide  (HYZAAR) 100-12.5 MG tablet TAKE ONE (1) TABLET EACH DAY 90 tablet 0   Magnesium  250 MG TABS Take 250 mg by mouth daily.     meclizine  (ANTIVERT ) 25 MG tablet Take 25 mg by mouth 3 (three) times daily as needed for dizziness.      metoprolol  succinate (TOPROL -XL) 50 MG 24 hr tablet TAKE ONE (1) TABLET BY MOUTH EVERY DAY 90 tablet 1   potassium chloride  (KLOR-CON ) 10 MEQ tablet TAKE ONE (1) TABLET EACH DAY 90 tablet 0   Propylene Glycol (SYSTANE BALANCE) 0.6 % SOLN Place 1 drop into both eyes daily as needed (dry eyes).     RABEprazole  (ACIPHEX ) 20 MG tablet TAKE ONE (1) TABLET EACH DAY 90 tablet 0   rosuvastatin  (CRESTOR ) 40 MG tablet TAKE ONE (1) TABLET EACH DAY 90 tablet 3   Sitaglipt Base-Metform HCl ER (ZITUVIMET  XR) 50-500 MG TB24 Take 1 tablet by mouth daily. 90 tablet 3   solifenacin  (VESICARE ) 5 MG tablet TAKE ONE (1) TABLET BY MOUTH EVERY DAY 90 tablet 0   valACYclovir  (VALTREX ) 1000 MG tablet Take two tablets at onset of cold sore and repeat two tablets in 12 hours. 30 tablet 1   levETIRAcetam  (KEPPRA ) 500 MG tablet Take 1 tablet (500 mg total) by mouth 2 (two) times daily. (Patient not taking: Reported on 09/02/2023) 60 tablet 0   No current facility-administered medications for this visit.    PHYSICAL EXAM: Vitals:   09/02/23 1318  BP: 118/72  Pulse: 76  Resp: 16  SpO2: 99%   Weight: 237 lb 3.2 oz (107.6 kg)  Height: 5' 4 (1.626 m)     Body mass index is 40.72 kg/m.  Wt Readings from Last 3 Encounters:  09/02/23 237 lb 3.2 oz (107.6 kg)  08/21/23 234 lb (106.1 kg)  08/13/23 236 lb 3.2 oz (107.1 kg)    General: Well developed, well nourished female in no apparent distress.  HEENT: AT/[place], no external lesions.  Eyes: Conjunctiva clear and no icterus. Neck: Neck supple  Lungs: Respirations not labored Neurologic: Alert, oriented, normal speech Extremities / Skin: Dry.  Psychiatric: Does not appear depressed or anxious  Diabetic Foot Exam - Simple   Simple Foot Form Diabetic Foot exam was performed with the following findings: Yes 09/02/2023  1:35 PM  Visual Inspection No deformities, no ulcerations, no other skin breakdown bilaterally: Yes Sensation Testing Intact to touch and monofilament testing bilaterally: Yes Pulse Check Posterior Tibialis and Dorsalis pulse intact bilaterally: Yes Comments    LABS Reviewed Lab Results  Component Value  Date   HGBA1C 7.1 (H) 08/13/2023   HGBA1C 6.9 (H) 04/25/2023   HGBA1C 6.6 (H) 01/03/2023   No results found for: FRUCTOSAMINE Lab Results  Component Value Date   CHOL 171 02/25/2023   HDL 87.90 02/25/2023   LDLCALC 69 02/25/2023   LDLDIRECT 195.4 04/15/2012   TRIG 72.0 02/25/2023   CHOLHDL 2 02/25/2023   Lab Results  Component Value Date   MICRALBCREAT 6 04/25/2023   Lab Results  Component Value Date   CREATININE 1.17 08/13/2023   Lab Results  Component Value Date   GFR 44.01 (L) 08/13/2023    ASSESSMENT / PLAN  1. Controlled type 2 diabetes mellitus without complication, without long-term current use of insulin  (HCC)   2. Hypothyroidism, postablative   3. H/O Graves' disease      Diabetes Mellitus type 2, complicated by no known complications. - Diabetic status / severity: Fair controlled.  Lab Results  Component Value Date   HGBA1C 7.1 (H) 08/13/2023    - Hemoglobin A1c  goal : <7%  - Medications: No change  I) continue Zituvimet   (  saxagliptin/metformin )  XR 50/500 1 tablet daily.  - Home glucose testing: Daily in the morning fasting. - Discussed/ Gave Hypoglycemia treatment plan.  # Consult : not required at this time.   # Annual urine for microalbuminuria/ creatinine ratio, no microalbuminuria currently, continue ACE/ARB /losartan .   Last  Lab Results  Component Value Date   MICRALBCREAT 6 04/25/2023    # Foot check nightly.  # Annual dilated diabetic eye exams.   - Diet: Make healthy diabetic food choices - Life style / activity / exercise: Discussed.  2. Blood pressure  -  BP Readings from Last 1 Encounters:  09/02/23 118/72    - Control is in target.  - No change in current plans.  3. Lipid status / Hyperlipidemia - Last  Lab Results  Component Value Date   LDLCALC 69 02/25/2023   - Continue rosuvastatin  40 mg daily.  Managed by primary care provider.  # Postablative hypothyroidism -Continue current dose of levothyroxine  100 mcg daily.  Annual thyroid  lab.   Diagnoses and all orders for this visit:  Controlled type 2 diabetes mellitus without complication, without long-term current use of insulin  (HCC)  Hypothyroidism, postablative  H/O Graves' disease     DISPOSITION Follow up in clinic in 4 months suggested.  Labs on the same day of the visit.   All questions answered and patient verbalized understanding of the plan.  [place] Adaleena Mooers, MD [facility] [address] [place], [address] [place], [place] [address] Phone # [phone]  At least part of this note was generated using voice recognition software. Inadvertent word errors may have occurred, which were not recognized during the proofreading process.

## 2023-10-03 ENCOUNTER — Other Ambulatory Visit: Payer: Self-pay | Admitting: Family Medicine

## 2023-10-04 ENCOUNTER — Encounter: Payer: Self-pay | Admitting: Family Medicine

## 2023-10-04 ENCOUNTER — Ambulatory Visit (INDEPENDENT_AMBULATORY_CARE_PROVIDER_SITE_OTHER): Admitting: Family Medicine

## 2023-10-04 VITALS — BP 110/70 | HR 73 | Temp 98.0°F | Wt 236.6 lb

## 2023-10-04 DIAGNOSIS — R109 Unspecified abdominal pain: Secondary | ICD-10-CM | POA: Diagnosis not present

## 2023-10-04 NOTE — Progress Notes (Signed)
 Established Patient Office Visit  Subjective   Patient ID: Dawn Salas, female    DOB: 1940/05/24  Age: 83 y.o. MRN: [id number]  Chief Complaint  Patient presents with   Back Pain    HPI   Dawn Salas is seen today with approximate 1 month history of pain in her left flank and upper lumbar region.  She has had prior lumbar fusion surgery x 2 in the past this pain is somewhat different.  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  She feels like this may be more muscular.  Pain worse with changing positions and turning over in bed.  Denies any left lower extremity radiculitis symptoms.  She is taken some Tylenol  with some relief.  Denies any burning with urination.  No gross hematuria.  No history of kidney stones.  She was worried this may be somehow kidney related .  She saw someone at [facility] last year and had some type of x-ray and was told she may have had smaller kidney on the left side.  Past Medical History:  Diagnosis Date   Allergy    Anxiety    Arthritis    Asthma    patient denies    Cataract    bilateral lens implants    Colon polyps    Depression    Diabetes mellitus    Type two   Diverticulitis    Double vision 2019   left eye   Family history of adverse reaction to anesthesia    daughter- N/V    Family history of ovarian cancer    Family history of pancreatic cancer    Frequency of urination    GERD (gastroesophageal reflux disease)    History of adverse reaction to anesthesia 03/19/2022   After regional block for TKA pt states she had uncontrollable frequent blinking and zoned out was unable to speak more than one or two words for about 10 minutes   History of hiatal hernia    Hyperglycemia    Hyperlipidemia    Hypertension    Hyperthyroidism    PT HAD BIPOSY -NEGATIVE.SABRA AND NOW JUST GETS CHECKED EACH YEAR .SABRA 2013 LAST TEST   SEES DR. [name].    Multiple  meningiomas of spine and brain (HCC)    Obstructive sleep apnea    STUDY AT [facility] DOES NOT USE C PAP   Palpitations    Peptic ulcer disease    Pneumonia    walking pneumonia hx of   Seizures (HCC) 2015   one siezure with brain surgery none since 2015    Sleep apnea    no CPAP   Urinary tract infection 03/2020   Vertigo    Past Surgical History:  Procedure Laterality Date   ABDOMINAL HYSTERECTOMY     ANTERIOR CERVICAL DECOMP/DISCECTOMY FUSION N/A 04/19/2021   Procedure: Anterior Cervical Discectomy and Fusion with Interbody Prosthesis, Plates/Screws Cervical Three-Four/Cervical Four-Five REMoval CERVICAL PLATE;  Surgeon: Mavis Purchase, MD;  Location: [facility] OR;  Service: Neurosurgery;  Laterality: N/A;  3C   APPLICATION OF CRANIAL NAVIGATION Left 01/09/2023   Procedure: APPLICATION OF CRANIAL NAVIGATION;  Surgeon: Mavis Purchase, MD;  Location: [facility] OR;  Service: Neurosurgery;  Laterality: Left;   BACK SURGERY     L SPIN 2003   NECK 2004   BRAIN MENINGIOMA EXCISION  08/2008   X 2   C5-C6 neck fusion     CATARACT EXTRACTION  Bilateral    COLONOSCOPY  05/05/2009   pt states multiple polyps removed in both colonoscopies she has had   CRANIOTOMY  08/13/2011   Procedure: CRANIOTOMY TUMOR EXCISION;  Surgeon: Reyes JONETTA Budge, MD;  Location: [facility];  Service: Neurosurgery;  Laterality: Bilateral;  Bifrontal craniotomy for tumor   CRANIOTOMY Left 01/09/2023   Procedure: FRONTAL CRANIOTOMY;  Surgeon: Budge Reyes, MD;  Location: [facility] OR;  Service: Neurosurgery;  Laterality: Left;   DILATION AND CURETTAGE OF UTERUS     YEARS AGO   L4-L5 posterior la  2021   L3-L5 fusion   left foot plantar and hammertoe     right foot bunectomy     right knee arthroscopy     x 3   right shoulder rotator cuff     ROBOTIC ASSISTED BILATERAL SALPINGO OOPHERECTOMY N/A 08/25/2015   Procedure: XI ROBOTIC ASSISTED BILATERAL SALPINGO OOPHORECTOMY;  Surgeon: Sari Bachelor, MD;  Location: [facility];   Service: Gynecology;  Laterality: N/A;   TONSILLECTOMY     TOTAL KNEE ARTHROPLASTY Right 03/19/2022   Procedure: TOTAL KNEE ARTHROPLASTY;  Surgeon: Melodi Lerner, MD;  Location: [facility];  Service: Orthopedics;  Laterality: Right;   TUBAL LIGATION     WRIST SURGERY Right     reports that she has never smoked. She has never used smokeless tobacco. She reports that she does not drink alcohol  and does not use drugs. family history includes Anesthesia problems in her daughter; Arthritis in an other family member; Diabetes in her maternal grandmother and another family member; Heart disease (age of onset: 90) in her father; Hyperlipidemia in an other family member; Hypertension in an other family member; Ovarian cancer in her mother; Pancreatic cancer in her maternal aunt. Allergies  Allergen Reactions   Aspirin Effervescent Swelling    Alka-Seltzer gel caps- sore mouth, face swollen, turned hands white   Flagyl  [Metronidazole ] Other (See Comments)    Caused increased heart rate   Bupivacaine  Other (See Comments)    Eye Batting really fast, Could not speak normally, Words were long   Morphine  Sulfate Hives    Hallucinations   Penicillins Swelling and Rash    Previously tolerated cephalexin , cefazolin  03/19/22    Review of Systems  Constitutional:  Negative for chills, fever and weight loss.  Respiratory:  Negative for shortness of breath.   Cardiovascular:  Negative for chest pain.  Gastrointestinal:  Negative for abdominal pain.  Musculoskeletal:  Positive for back pain.      Objective:     BP 110/70   Pulse 73   Temp 98 F (36.7 C) (Oral)   Wt 236 lb 9.6 oz (107.3 kg)   SpO2 95%   BMI 40.61 kg/m  BP Readings from Last 3 Encounters:  10/04/23 110/70  09/02/23 118/72  08/21/23 (!) 169/94   Wt Readings from Last 3 Encounters:  10/04/23 236 lb 9.6 oz (107.3 kg)  09/02/23 237 lb 3.2 oz (107.6 kg)  08/21/23 234 lb (106.1 kg)      Physical Exam Vitals reviewed.   Constitutional:      General: She is not in acute distress.    Appearance: She is not ill-appearing.  Cardiovascular:     Rate and Rhythm: Normal rate and regular rhythm.  Pulmonary:     Effort: Pulmonary effort is normal.     Breath sounds: Normal breath sounds. No wheezing or rales.  Musculoskeletal:     Comments: Straight leg raises are negative No reproducible back tenderness involving  lumbar or thoracic spine.  Neurological:     Mental Status: She is alert.     Comments: No focal strength deficits lower extremities      No results found for any visits on 10/04/23.    The ASCVD Risk score (Arnett DK, et al., 2019) failed to calculate for the following reasons:   The 2019 ASCVD risk score is only valid for ages 3 to 81    Assessment & Plan:   1 month history of pain left flank region.  No urinary symptoms.  Denies any red flags such as fever, chills, weight loss, etc. suspect musculoskeletal.  -Check urinalysis with reflex microscopy if indicated - Continue Tylenol  as needed - Consider trial of physical therapy if urinalysis unremarkable and symptoms persist   No follow-ups on file.    Wolm Scarlet, MD

## 2023-10-06 ENCOUNTER — Ambulatory Visit: Payer: Self-pay | Admitting: Family Medicine

## 2023-10-06 LAB — URINALYSIS W MICROSCOPIC + REFLEX CULTURE
Bacteria, UA: NONE SEEN /HPF
Bilirubin Urine: NEGATIVE
Glucose, UA: NEGATIVE
Hgb urine dipstick: NEGATIVE
Hyaline Cast: NONE SEEN /LPF
Ketones, ur: NEGATIVE
Nitrites, Initial: NEGATIVE
Protein, ur: NEGATIVE
Specific Gravity, Urine: 1.016 (ref 1.001–1.035)
WBC, UA: NONE SEEN /HPF (ref 0–5)
pH: 6.5 (ref 5.0–8.0)

## 2023-10-06 LAB — URINE CULTURE
MICRO NUMBER:: 16963943
SPECIMEN QUALITY:: ADEQUATE

## 2023-10-06 LAB — CULTURE INDICATED

## 2023-10-07 MED ORDER — CEPHALEXIN 500 MG PO CAPS
500.0000 mg | ORAL_CAPSULE | Freq: Three times a day (TID) | ORAL | 0 refills | Status: AC
Start: 2023-10-07 — End: 2023-10-14

## 2023-10-17 ENCOUNTER — Other Ambulatory Visit: Payer: Self-pay | Admitting: Family Medicine

## 2023-10-28 ENCOUNTER — Other Ambulatory Visit: Payer: Self-pay | Admitting: Family Medicine

## 2023-10-28 DIAGNOSIS — Z1231 Encounter for screening mammogram for malignant neoplasm of breast: Secondary | ICD-10-CM

## 2023-10-29 ENCOUNTER — Inpatient Hospital Stay: Admission: RE | Admit: 2023-10-29 | Discharge: 2023-10-29 | Attending: Family Medicine | Admitting: Family Medicine

## 2023-10-29 DIAGNOSIS — Z1231 Encounter for screening mammogram for malignant neoplasm of breast: Secondary | ICD-10-CM

## 2023-11-04 ENCOUNTER — Other Ambulatory Visit: Payer: Self-pay | Admitting: Family Medicine

## 2023-11-13 ENCOUNTER — Ambulatory Visit: Admitting: Family Medicine

## 2023-11-13 ENCOUNTER — Encounter: Payer: Self-pay | Admitting: Family Medicine

## 2023-11-13 VITALS — BP 132/62 | HR 85 | Temp 97.7°F | Wt 234.4 lb

## 2023-11-13 DIAGNOSIS — I10 Essential (primary) hypertension: Secondary | ICD-10-CM | POA: Diagnosis not present

## 2023-11-13 DIAGNOSIS — E7800 Pure hypercholesterolemia, unspecified: Secondary | ICD-10-CM | POA: Diagnosis not present

## 2023-11-13 DIAGNOSIS — Z23 Encounter for immunization: Secondary | ICD-10-CM | POA: Diagnosis not present

## 2023-11-13 DIAGNOSIS — M159 Polyosteoarthritis, unspecified: Secondary | ICD-10-CM | POA: Diagnosis not present

## 2023-11-13 NOTE — Progress Notes (Unsigned)
 Established Patient Office Visit  Subjective   Patient ID: Dawn Salas, female    DOB: 07/08/1940  Age: 84 y.o. MRN: [id number]  Chief Complaint  Patient presents with   Medical Management of Chronic Issues    HPI  {History (Optional):23778} Dawn Salas is seen for 45-month medical follow-up.  She unfortunately had a girlfriend that passed away yesterday from complications of pneumonia.  Overall she is doing fairly well.  She has had some ongoing problems intermittently through her neck and upper back.  She had neck surgery back in February.  She has follow-up with neurosurgeon in a couple of weeks.  Does still need flu vaccine.  She brings in handicap form for parking for completion.  She ambulates with a cane.  She has difficulty ambulating more than 200 feet without stopping to rest.  She has hypertension treated with amlodipine , metoprolol , losartan /HCTZ.  Compliant with medications.  She has hypothyroidism followed by endocrinology.  She has hyperlipidemia treated with rosuvastatin .  Lipids were checked last February.    Past Medical History:  Diagnosis Date   Allergy    Anxiety    Arthritis    Asthma    patient denies    Cataract    bilateral lens implants    Colon polyps    Depression    Diabetes mellitus    Type two   Diverticulitis    Double vision 2019   left eye   Family history of adverse reaction to anesthesia    daughter- N/V    Family history of ovarian cancer    Family history of pancreatic cancer    Frequency of urination    GERD (gastroesophageal reflux disease)    History of adverse reaction to anesthesia 03/19/2022   After regional block for TKA pt states she had uncontrollable frequent blinking and zoned out was unable to speak more than one or two words for about 10 minutes   History of hiatal hernia    Hyperglycemia    Hyperlipidemia    Hypertension    Hyperthyroidism    PT HAD BIPOSY -NEGATIVE.SABRA AND NOW JUST GETS CHECKED EACH YEAR .SABRA 2013  LAST TEST   SEES DR. [name].    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 DOES NOT USE C PAP   Palpitations    Peptic ulcer disease    Pneumonia    walking pneumonia hx of   Seizures (HCC) 2015   one siezure with brain surgery none since 2015    Sleep apnea    no CPAP   Urinary tract infection 03/2020   Vertigo    Past Surgical History:  Procedure Laterality Date   ABDOMINAL HYSTERECTOMY     ANTERIOR CERVICAL DECOMP/DISCECTOMY FUSION N/A 04/19/2021   Procedure: Anterior Cervical Discectomy and Fusion with Interbody Prosthesis, Plates/Screws Cervical Three-Four/Cervical Four-Five REMoval CERVICAL PLATE;  Surgeon: Mavis Purchase, MD;  Location: [facility] OR;  Service: Neurosurgery;  Laterality: N/A;  3C   APPLICATION OF CRANIAL NAVIGATION Left 01/09/2023   Procedure: APPLICATION OF CRANIAL NAVIGATION;  Surgeon: Mavis Purchase, MD;  Location: [facility] OR;  Service: Neurosurgery;  Laterality: Left;   BACK SURGERY     L SPIN 2003   NECK 2004   BRAIN MENINGIOMA EXCISION  08/2008   X 2   C5-C6 neck fusion     CATARACT EXTRACTION Bilateral    COLONOSCOPY  05/05/2009   pt states multiple polyps removed  in both colonoscopies she has had   CRANIOTOMY  08/13/2011   Procedure: CRANIOTOMY TUMOR EXCISION;  Surgeon: Reyes JONETTA Budge, MD;  Location: [facility];  Service: Neurosurgery;  Laterality: Bilateral;  Bifrontal craniotomy for tumor   CRANIOTOMY Left 01/09/2023   Procedure: FRONTAL CRANIOTOMY;  Surgeon: Budge Reyes, MD;  Location: [facility] OR;  Service: Neurosurgery;  Laterality: Left;   DILATION AND CURETTAGE OF UTERUS     YEARS AGO   L4-L5 posterior la  2021   L3-L5 fusion   left foot plantar and hammertoe     right foot bunectomy     right knee arthroscopy     x 3   right shoulder rotator cuff     ROBOTIC ASSISTED BILATERAL SALPINGO OOPHERECTOMY N/A 08/25/2015   Procedure: XI ROBOTIC ASSISTED BILATERAL SALPINGO OOPHORECTOMY;  Surgeon:  Sari Bachelor, MD;  Location: [facility]S;  Service: Gynecology;  Laterality: N/A;   TONSILLECTOMY     TOTAL KNEE ARTHROPLASTY Right 03/19/2022   Procedure: TOTAL KNEE ARTHROPLASTY;  Surgeon: Melodi Lerner, MD;  Location: [facility];  Service: Orthopedics;  Laterality: Right;   TUBAL LIGATION     WRIST SURGERY Right     reports that she has never smoked. She has never used smokeless tobacco. She reports that she does not drink alcohol  and does not use drugs. family history includes Anesthesia problems in her daughter; Arthritis in an other family member; Diabetes in her maternal grandmother and another family member; Heart disease (age of onset: 24) in her father; Hyperlipidemia in an other family member; Hypertension in an other family member; Ovarian cancer in her mother; Pancreatic cancer in her maternal aunt. Allergies  Allergen Reactions   Aspirin Effervescent Swelling    Alka-Seltzer gel caps- sore mouth, face swollen, turned hands white   Flagyl  Cu[place]ht[place]oc.Cough ] Other (See Comments)    Caused increased heart rate   Bupivacaine  Other (See Comments)    Eye Batting really fast, Could not speak normally, Words were long   Morphine  Sulfate Hives    Hallucinations   Penicillins Swelling and Rash    Previously tolerated cephalexin , cefazolin  03/19/22    Review of Systems  Eyes:  Negative for blurred vision.  Respiratory:  Negative for shortness of breath.   Cardiovascular:  Negative for chest pain.  Musculoskeletal:  Positive for joint pain and neck pain.  Neurological:  Negative for dizziness, weakness and headaches.      Objective:     BP 132/62 (BP Location: Left Arm, Cuff Size: Large)   Pulse 85   Temp 97.7 F (36.5 C) (Oral)   Wt 234 lb 6.4 oz (106.3 kg)   SpO2 95%   BMI 40.23 kg/m  BP Readings from Last 3 Encounters:  11/13/23 132/62  10/04/23 110/70  09/02/23 118/72   Wt Readings from Last 3 Encounters:  11/13/23 234 lb 6.4 oz (106.3 kg)  10/04/23 236 lb 9.6 oz (107.3  kg)  09/02/23 237 lb 3.2 oz (107.6 kg)      Physical Exam Vitals reviewed.  Constitutional:      General: She is not in acute distress.    Appearance: She is well-developed. She is not ill-appearing.  Eyes:     Pupils: Pupils are equal, round, and reactive to light.  Neck:     Thyroid : No thyromegaly.     Vascular: No JVD.  Cardiovascular:     Rate and Rhythm: Normal rate and regular rhythm.     Heart sounds:     No  gallop.  Pulmonary:     Effort: Pulmonary effort is normal. No respiratory distress.     Breath sounds: Normal breath sounds. No wheezing or rales.  Musculoskeletal:     Cervical back: Neck supple.  Neurological:     Mental Status: She is alert.      No results found for any visits on 11/13/23.  {Labs (Optional):23779}  The ASCVD Risk score (Arnett DK, et al., 2019) failed to calculate for the following reasons:   The 2019 ASCVD risk score is only valid for ages 42 to 30    Assessment & Plan:   #1 hypertension stable.  Continue amlodipine , metoprolol , losartan /HCTZ.  Continue low-sodium diet.  #2 hyperlipidemia treated with rosuvastatin  40 mg daily.  Lipids were checked in February.  Will plan to recheck fasting lipids at 3 to 72-month follow-up.  Continue low saturated fat diet.  #3 osteoarthritis involving multiple joints.  She has significant chronic neck and back pain.  Handicap parking forms completed to take to [facility].  This will be for 5 years.  #4 health maintenance.  Patient due for flu vaccine and consents and this was given today.   Return in about 3 months (around 02/13/2024).    Wolm Scarlet, MD

## 2023-11-17 ENCOUNTER — Encounter: Payer: Self-pay | Admitting: Family Medicine

## 2023-11-18 ENCOUNTER — Encounter: Payer: Self-pay | Admitting: Family Medicine

## 2023-11-18 ENCOUNTER — Ambulatory Visit: Admitting: Family Medicine

## 2023-11-18 VITALS — BP 150/80 | HR 71 | Temp 97.5°F | Wt 234.8 lb

## 2023-11-18 DIAGNOSIS — R300 Dysuria: Secondary | ICD-10-CM | POA: Diagnosis not present

## 2023-11-18 LAB — POC URINALSYSI DIPSTICK (AUTOMATED)
Bilirubin, UA: NEGATIVE
Glucose, UA: NEGATIVE
Ketones, UA: NEGATIVE
Nitrite, UA: POSITIVE
Protein, UA: POSITIVE — AB
Spec Grav, UA: 1.015 (ref 1.010–1.025)
Urobilinogen, UA: 0.2 U/dL
pH, UA: 6 (ref 5.0–8.0)

## 2023-11-18 MED ORDER — CEPHALEXIN 500 MG PO CAPS: 500.0000 mg | ORAL_CAPSULE | Freq: Three times a day (TID) | ORAL | 0 refills | Status: DC

## 2023-11-18 NOTE — Progress Notes (Signed)
 Established Patient Office Visit  Subjective   Patient ID: Dawn Salas, female    DOB: Jul 30, 1940  Age: 83 y.o. MRN: [id number]  Chief Complaint  Patient presents with   Dysuria    HPI   Dawn Salas is seen for possible UTI.  Symptoms started just couple days ago.  She noticed some suprapubic pressure initially followed by burning and frequency.  No flank pain.  No nausea or vomiting.  No fevers or chills.  No gross hematuria.  She was just here 5 days ago but had no similar symptoms then.  No recent bladder instrumentation.  Past Medical History:  Diagnosis Date   Allergy    Anxiety    Arthritis    Asthma    patient denies    Cataract    bilateral lens implants    Colon polyps    Depression    Diabetes mellitus    Type two   Diverticulitis    Double vision 2019   left eye   Family history of adverse reaction to anesthesia    daughter- N/V    Family history of ovarian cancer    Family history of pancreatic cancer    Frequency of urination    GERD (gastroesophageal reflux disease)    History of adverse reaction to anesthesia 03/19/2022   After regional block for TKA pt states she had uncontrollable frequent blinking and zoned out was unable to speak more than one or two words for about 10 minutes   History of hiatal hernia    Hyperglycemia    Hyperlipidemia    Hypertension    Hyperthyroidism    PT HAD BIPOSY -NEGATIVE.SABRA AND NOW JUST GETS CHECKED EACH YEAR .SABRA 2013 LAST TEST   SEES DR. [name].    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 DOES NOT USE C PAP   Palpitations    Peptic ulcer disease    Pneumonia    walking pneumonia hx of   Seizures (HCC) 2015   one siezure with brain surgery none since 2015    Sleep apnea    no CPAP   Urinary tract infection 03/2020   Vertigo    Past Surgical History:  Procedure Laterality Date   ABDOMINAL HYSTERECTOMY     ANTERIOR CERVICAL DECOMP/DISCECTOMY FUSION N/A  04/19/2021   Procedure: Anterior Cervical Discectomy and Fusion with Interbody Prosthesis, Plates/Screws Cervical Three-Four/Cervical Four-Five REMoval CERVICAL PLATE;  Surgeon: Mavis Purchase, MD;  Location: [facility] OR;  Service: Neurosurgery;  Laterality: N/A;  3C   APPLICATION OF CRANIAL NAVIGATION Left 01/09/2023   Procedure: APPLICATION OF CRANIAL NAVIGATION;  Surgeon: Mavis Purchase, MD;  Location: [facility] OR;  Service: Neurosurgery;  Laterality: Left;   BACK SURGERY     L SPIN 2003   NECK 2004   BRAIN MENINGIOMA EXCISION  08/2008   X 2   C5-C6 neck fusion     CATARACT EXTRACTION Bilateral    COLONOSCOPY  05/05/2009   pt states multiple polyps removed in both colonoscopies she has had   CRANIOTOMY  08/13/2011   Procedure: CRANIOTOMY TUMOR EXCISION;  Surgeon: Purchase JONETTA Mavis, MD;  Location: [facility];  Service: Neurosurgery;  Laterality: Bilateral;  Bifrontal craniotomy for tumor   CRANIOTOMY Left 01/09/2023   Procedure: FRONTAL CRANIOTOMY;  Surgeon: Mavis Purchase, MD;  Location: [facility] OR;  Service: Neurosurgery;  Laterality: Left;   DILATION AND CURETTAGE OF UTERUS  YEARS AGO   L4-L5 posterior la  2021   L3-L5 fusion   left foot plantar and hammertoe     right foot bunectomy     right knee arthroscopy     x 3   right shoulder rotator cuff     ROBOTIC ASSISTED BILATERAL SALPINGO OOPHERECTOMY N/A 08/25/2015   Procedure: XI ROBOTIC ASSISTED BILATERAL SALPINGO OOPHORECTOMY;  Surgeon: Sari Bachelor, MD;  Location: [facility];  Service: Gynecology;  Laterality: N/A;   TONSILLECTOMY     TOTAL KNEE ARTHROPLASTY Right 03/19/2022   Procedure: TOTAL KNEE ARTHROPLASTY;  Surgeon: Melodi Lerner, MD;  Location: [facility];  Service: Orthopedics;  Laterality: Right;   TUBAL LIGATION     WRIST SURGERY Right     reports that she has never smoked. She has never used smokeless tobacco. She reports that she does not drink alcohol  and does not use drugs. family history includes Anesthesia problems in  her daughter; Arthritis in an other family member; Diabetes in her maternal grandmother and another family member; Heart disease (age of onset: 24) in her father; Hyperlipidemia in an other family member; Hypertension in an other family member; Ovarian cancer in her mother; Pancreatic cancer in her maternal aunt. Allergies  Allergen Reactions   Aspirin Effervescent Swelling    Alka-Seltzer gel caps- sore mouth, face swollen, turned hands white   Flagyl  [Metronidazole ] Other (See Comments)    Caused increased heart rate   Bupivacaine  Other (See Comments)    Eye Batting really fast, Could not speak normally, Words were long   Morphine  Sulfate Hives    Hallucinations   Penicillins Swelling and Rash    Previously tolerated cephalexin , cefazolin  03/19/22    Review of Systems  Constitutional:  Negative for chills and fever.  Genitourinary:  Positive for dysuria.      Objective:     BP (!) 150/80   Pulse 71   Temp (!) 97.5 F (36.4 C) (Oral)   Wt 234 lb 12.8 oz (106.5 kg)   SpO2 96%   BMI 40.30 kg/m  BP Readings from Last 3 Encounters:  11/18/23 (!) 150/80  11/13/23 132/62  10/04/23 110/70   Wt Readings from Last 3 Encounters:  11/18/23 234 lb 12.8 oz (106.5 kg)  11/13/23 234 lb 6.4 oz (106.3 kg)  10/04/23 236 lb 9.6 oz (107.3 kg)      Physical Exam Vitals reviewed.  Constitutional:      General: She is not in acute distress.    Appearance: She is not ill-appearing or toxic-appearing.  Cardiovascular:     Rate and Rhythm: Normal rate and regular rhythm.  Pulmonary:     Effort: Pulmonary effort is normal.     Breath sounds: Normal breath sounds.  Neurological:     Mental Status: She is alert.      Results for orders placed or performed in visit on 11/18/23  POCT Urinalysis Dipstick (Automated)  Result Value Ref Range   Color, UA yellow    Clarity, UA cloudy    Glucose, UA Negative Negative   Bilirubin, UA negative    Ketones, UA negative    Spec Grav, UA  1.015 1.010 - 1.025   Blood, UA 3+    pH, UA 6.0 5.0 - 8.0   Protein, UA Positive (A) Negative   Urobilinogen, UA 0.2 0.2 or 1.0 E.U./dL   Nitrite, UA positive    Leukocytes, UA Large (3+) (A) Negative      The ASCVD Risk score (Arnett DK,  et al., 2019) failed to calculate for the following reasons:   The 2019 ASCVD risk score is only valid for ages 53 to 18    Assessment & Plan:   Problem List Items Addressed This Visit   None Visit Diagnoses       Dysuria    -  Primary   Relevant Orders   POCT Urinalysis Dipstick (Automated) (Completed)   Urine Culture     2-day history of dysuria and suprapubic pressure.  Urine dipstick strongly suggest UTI with positive leukocytes, nitrites, and blood.  Urine culture sent.  Start Keflex  500 mg 3 times daily for 7 days pending culture results.  She has tolerated cephalosporins without difficulty in the past.  No follow-ups on file.    Wolm Scarlet, MD

## 2023-11-20 ENCOUNTER — Ambulatory Visit: Payer: Self-pay | Admitting: Family Medicine

## 2023-11-20 LAB — URINE CULTURE
MICRO NUMBER:: 17151466
SPECIMEN QUALITY:: ADEQUATE

## 2023-11-20 MED ORDER — CIPROFLOXACIN HCL 500 MG PO TABS: 500.0000 mg | ORAL_TABLET | Freq: Two times a day (BID) | ORAL | 0 refills | Status: AC

## 2023-12-09 ENCOUNTER — Other Ambulatory Visit: Payer: Self-pay

## 2023-12-09 ENCOUNTER — Ambulatory Visit: Attending: Student | Admitting: Physical Therapy

## 2023-12-09 ENCOUNTER — Encounter: Payer: Self-pay | Admitting: Physical Therapy

## 2023-12-09 DIAGNOSIS — M542 Cervicalgia: Secondary | ICD-10-CM | POA: Diagnosis present

## 2023-12-09 DIAGNOSIS — M62838 Other muscle spasm: Secondary | ICD-10-CM | POA: Insufficient documentation

## 2023-12-09 DIAGNOSIS — R293 Abnormal posture: Secondary | ICD-10-CM | POA: Diagnosis present

## 2023-12-09 NOTE — Therapy (Signed)
 OUTPATIENT PHYSICAL THERAPY CERVICAL EVALUATION   Patient Name: Dawn Salas MRN: [id number] DOB:1940-08-02, 83 y.o., female Today's Date: 12/09/2023  END OF SESSION:  PT End of Session - 12/09/23 1339     Visit Number 1    Number of Visits 12    Date for Recertification  01/20/24    PT Start Time 0100    PT Stop Time 0154    PT Time Calculation (min) 54 min    Activity Tolerance Patient tolerated treatment well    Behavior During Therapy [facility] for tasks assessed/performed          Past Medical History:  Diagnosis Date   Allergy    Anxiety    Arthritis    Asthma    patient denies    Cataract    bilateral lens implants    Colon polyps    Depression    Diabetes mellitus    Type two   Diverticulitis    Double vision 2019   left eye   Family history of adverse reaction to anesthesia    daughter- N/V    Family history of ovarian cancer    Family history of pancreatic cancer    Frequency of urination    GERD (gastroesophageal reflux disease)    History of adverse reaction to anesthesia 03/19/2022   After regional block for TKA pt states she had uncontrollable frequent blinking and zoned out was unable to speak more than one or two words for about 10 minutes   History of hiatal hernia    Hyperglycemia    Hyperlipidemia    Hypertension    Hyperthyroidism    PT HAD BIPOSY -NEGATIVE.SABRA AND NOW JUST GETS CHECKED EACH YEAR .SABRA 2013 LAST TEST   SEES DR. [name].    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 DOES NOT USE C PAP   Palpitations    Peptic ulcer disease    Pneumonia    walking pneumonia hx of   Seizures (HCC) 2015   one siezure with brain surgery none since 2015    Sleep apnea    no CPAP   Urinary tract infection 03/2020   Vertigo    Past Surgical History:  Procedure Laterality Date   ABDOMINAL HYSTERECTOMY     ANTERIOR CERVICAL DECOMP/DISCECTOMY FUSION N/A 04/19/2021   Procedure: Anterior Cervical  Discectomy and Fusion with Interbody Prosthesis, Plates/Screws Cervical Three-Four/Cervical Four-Five REMoval CERVICAL PLATE;  Surgeon: Mavis Purchase, MD;  Location: [facility] OR;  Service: Neurosurgery;  Laterality: N/A;  3C   APPLICATION OF CRANIAL NAVIGATION Left 01/09/2023   Procedure: APPLICATION OF CRANIAL NAVIGATION;  Surgeon: Mavis Purchase, MD;  Location: [facility] OR;  Service: Neurosurgery;  Laterality: Left;   BACK SURGERY     L SPIN 2003   NECK 2004   BRAIN MENINGIOMA EXCISION  08/2008   X 2   C5-C6 neck fusion     CATARACT EXTRACTION Bilateral    COLONOSCOPY  05/05/2009   pt states multiple polyps removed in both colonoscopies she has had   CRANIOTOMY  08/13/2011   Procedure: CRANIOTOMY TUMOR EXCISION;  Surgeon: Purchase JONETTA Mavis, MD;  Location: [facility];  Service: Neurosurgery;  Laterality: Bilateral;  Bifrontal craniotomy for tumor   CRANIOTOMY Left 01/09/2023   Procedure: FRONTAL CRANIOTOMY;  Surgeon: Mavis Purchase, MD;  Location: [facility] OR;  Service: Neurosurgery;  Laterality: Left;   DILATION AND CURETTAGE OF UTERUS  YEARS AGO   L4-L5 posterior la  2021   L3-L5 fusion   left foot plantar and hammertoe     right foot bunectomy     right knee arthroscopy     x 3   right shoulder rotator cuff     ROBOTIC ASSISTED BILATERAL SALPINGO OOPHERECTOMY N/A 08/25/2015   Procedure: XI ROBOTIC ASSISTED BILATERAL SALPINGO OOPHORECTOMY;  Surgeon: Sari Bachelor, MD;  Location: [facility];  Service: Gynecology;  Laterality: N/A;   TONSILLECTOMY     TOTAL KNEE ARTHROPLASTY Right 03/19/2022   Procedure: TOTAL KNEE ARTHROPLASTY;  Surgeon: Melodi Lerner, MD;  Location: [facility];  Service: Orthopedics;  Laterality: Right;   TUBAL LIGATION     WRIST SURGERY Right    Patient Active Problem List   Diagnosis Date Noted   S/P craniotomy 01/09/2023   Brain tumor (HCC) 01/09/2023   OA (osteoarthritis) of knee 03/19/2022   Cervical spondylosis with myelopathy and radiculopathy 04/19/2021   Genetic  testing 08/01/2020   Family history of ovarian cancer 07/14/2020   Colon polyps 07/14/2020   Family history of pancreatic cancer 07/14/2020   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   CKD (chronic kidney disease) stage 3, GFR 30-59 ml/min (HCC) 10/01/2017   History of normocytic normochromic anemia 11/25/2015   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   Spondylolisthesis of lumbar region 12/23/2013   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   Hyperthyroidism 05/25/2013   Obesity (BMI 30-39.9) 12/30/2012   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HCC) 08/21/2011   Meningioma (HCC) 08/13/2011   DIZZINESS 11/02/2009   Type 2 diabetes mellitus, controlled (HCC) 05/20/2009   HYPOKALEMIA 05/02/2009   DEPRESSION 06/24/2008   GERD 06/24/2008   PEPTIC ULCER DISEASE 06/24/2008   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  THERAPY DIAG:  Cervicalgia  Abnormal posture  Other muscle spasm  Rationale for Evaluation and Treatment: Rehabilitation  ONSET DATE: Ongoing, surgery (04/20/23.    SUBJECTIVE:                                                                                                                                                                                                         SUBJECTIVE STATEMENT: The patient presents to the clinic with c/o left-sided neck pain.  She rates her  pain at a 7-8/10.  She states that doing shoulder rolls decreases her pain some.  Trying to sleep increases her pain.    PERTINENT HISTORY:  Please see above.    PAIN:  Are you having pain? Yes: NPRS scale: 7-8/10.  Pain location: Left cervical   Pain  description: Sharp, numb.   Aggravating factors: As above.   Relieving factors: As above.    PRECAUTIONS: None  RED FLAGS: None     WEIGHT BEARING RESTRICTIONS: No  FALLS:  Has patient fallen in last 6 months? No  LIVING ENVIRONMENT: Lives in: House/apartment Has following equipment at home: None  OCCUPATION: Retired.    PLOF: Independent  PATIENT GOALS: Not have pain.     OBJECTIVE:   PATIENT SURVEYS:  NDI:  23/50 (46%).    POSTURE: rounded shoulders and forward head  PALPATION: Tender to palpation over left SCM, left cervical paraspinal musculature and UT with trigger point noted.     CERVICAL ROM:   Active right cervical rotation is 50 degrees and left is 30 degrees.    UPPER EXTREMITY ROM:  WNL.    UPPER EXTREMITY MMT:  WNL.    DTR's:  UE DTR's 1+ to 2+/4+   TREATMENT DATE: 12/09/23:  STW/M x 10 minutes f/b    HMP and IFC at 80-150 Hz on 40% scan x 20 minutes.  Normal modality resposne following removal of modality.                                                                                                                                PATIENT EDUCATION:  Education details: Chin tucks. Person educated: Patient Education method: Medical Illustrator Education comprehension: verbalized understanding  HOME EXERCISE PROGRAM: As above.    ASSESSMENT:  CLINICAL IMPRESSION: The patient presents to [facility] with c/o left-sided neck pain.  She underwent a ACDF (C3-C5) on 04/20/23. She has limited bilateral active cervical rotation.   She is TTP over left SCM, left cervical paraspinal musculature and UT with trigger point noted.  Her NDI score is 23/50 (46%).  Her sleep is disturbed by pain.  Patient will benefit from skilled physical therapy intervention to address pain and deficits.  OBJECTIVE IMPAIRMENTS: decreased activity tolerance, decreased ROM, increased muscle spasms, postural dysfunction, and pain.   ACTIVITY LIMITATIONS: carrying,  lifting, and sleeping  PARTICIPATION LIMITATIONS: meal prep, cleaning, and laundry  PERSONAL FACTORS: Time since onset of injury/illness/exacerbation and 1 comorbidity: ACDF are also affecting patient's functional outcome.   REHAB POTENTIAL: Good  CLINICAL DECISION MAKING: Stable/uncomplicated  EVALUATION COMPLEXITY: Low   GOALS:  SHORT TERM GOALS: Target date: 12/23/23  Ind with an initial HEP.  Goal status: INITIAL   LONG TERM GOALS: Target date: 01/20/24  Ind with an advanced HEP.  Goal status: INITIAL  2.  Improve bilateral active cervical rotation to 60 degrees so she can turn her head more easily while driving.   Goal status:  INITIAL  3.  Perform ADL's with pain not > 3/10. Goal status: INITIAL  4.  Sleep 6 hours undisturbed.  Goal status: INITIAL  5.  Improve NDI by at least 5 points.  Goal status: INITIAL  PLAN:  PT FREQUENCY: 2x/week  PT DURATION: 6 weeks  PLANNED INTERVENTIONS: 97110-Therapeutic exercises, 97530- Therapeutic activity, [id number]- Neuromuscular re-education, 97535- Self Care, [id number]- Manual therapy, [id number]- Electrical stimulation (unattended), 97035- Ultrasound, Patient/Family education, Cryotherapy, and Moist heat  PLAN FOR NEXT SESSION: Postural exercises, modalities and STW/M as needed.     Yailin Biederman, PT 12/09/2023, 2:19 PM

## 2023-12-10 ENCOUNTER — Ambulatory Visit: Admitting: Physical Therapy

## 2023-12-10 DIAGNOSIS — R293 Abnormal posture: Secondary | ICD-10-CM

## 2023-12-10 DIAGNOSIS — M542 Cervicalgia: Secondary | ICD-10-CM

## 2023-12-10 DIAGNOSIS — M62838 Other muscle spasm: Secondary | ICD-10-CM

## 2023-12-10 NOTE — Therapy (Signed)
 OUTPATIENT PHYSICAL THERAPY CERVICAL TREATMENT  Patient Name: Dawn Salas MRN: [id number] DOB:July 16, 1940, 83 y.o., female Today's Date: 12/10/2023  END OF SESSION:  PT End of Session - 12/10/23 1131     Visit Number 2    Number of Visits 12    Date for Recertification  01/20/24    PT Start Time 1055    PT Stop Time 1149    PT Time Calculation (min) 54 min    Activity Tolerance Patient tolerated treatment well    Behavior During Therapy [facility] for tasks assessed/performed          Past Medical History:  Diagnosis Date   Allergy    Anxiety    Arthritis    Asthma    patient denies    Cataract    bilateral lens implants    Colon polyps    Depression    Diabetes mellitus    Type two   Diverticulitis    Double vision 2019   left eye   Family history of adverse reaction to anesthesia    daughter- N/V    Family history of ovarian cancer    Family history of pancreatic cancer    Frequency of urination    GERD (gastroesophageal reflux disease)    History of adverse reaction to anesthesia 03/19/2022   After regional block for TKA pt states she had uncontrollable frequent blinking and zoned out was unable to speak more than one or two words for about 10 minutes   History of hiatal hernia    Hyperglycemia    Hyperlipidemia    Hypertension    Hyperthyroidism    PT HAD BIPOSY -NEGATIVE.SABRA AND NOW JUST GETS CHECKED EACH YEAR .SABRA 2013 LAST TEST   SEES DR. [name].    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 DOES NOT USE C PAP   Palpitations    Peptic ulcer disease    Pneumonia    walking pneumonia hx of   Seizures (HCC) 2015   one siezure with brain surgery none since 2015    Sleep apnea    no CPAP   Urinary tract infection 03/2020   Vertigo    Past Surgical History:  Procedure Laterality Date   ABDOMINAL HYSTERECTOMY     ANTERIOR CERVICAL DECOMP/DISCECTOMY FUSION N/A 04/19/2021   Procedure: Anterior Cervical  Discectomy and Fusion with Interbody Prosthesis, Plates/Screws Cervical Three-Four/Cervical Four-Five REMoval CERVICAL PLATE;  Surgeon: Mavis Purchase, MD;  Location: [facility] OR;  Service: Neurosurgery;  Laterality: N/A;  3C   APPLICATION OF CRANIAL NAVIGATION Left 01/09/2023   Procedure: APPLICATION OF CRANIAL NAVIGATION;  Surgeon: Mavis Purchase, MD;  Location: [facility] OR;  Service: Neurosurgery;  Laterality: Left;   BACK SURGERY     L SPIN 2003   NECK 2004   BRAIN MENINGIOMA EXCISION  08/2008   X 2   C5-C6 neck fusion     CATARACT EXTRACTION Bilateral    COLONOSCOPY  05/05/2009   pt states multiple polyps removed in both colonoscopies she has had   CRANIOTOMY  08/13/2011   Procedure: CRANIOTOMY TUMOR EXCISION;  Surgeon: Purchase JONETTA Mavis, MD;  Location: [facility];  Service: Neurosurgery;  Laterality: Bilateral;  Bifrontal craniotomy for tumor   CRANIOTOMY Left 01/09/2023   Procedure: FRONTAL CRANIOTOMY;  Surgeon: Mavis Purchase, MD;  Location: [facility] OR;  Service: Neurosurgery;  Laterality: Left;   DILATION AND CURETTAGE OF UTERUS  YEARS AGO   L4-L5 posterior la  2021   L3-L5 fusion   left foot plantar and hammertoe     right foot bunectomy     right knee arthroscopy     x 3   right shoulder rotator cuff     ROBOTIC ASSISTED BILATERAL SALPINGO OOPHERECTOMY N/A 08/25/2015   Procedure: XI ROBOTIC ASSISTED BILATERAL SALPINGO OOPHORECTOMY;  Surgeon: Sari Bachelor, MD;  Location: [facility] ORS;  Service: Gynecology;  Laterality: N/A;   TONSILLECTOMY     TOTAL KNEE ARTHROPLASTY Right 03/19/2022   Procedure: TOTAL KNEE ARTHROPLASTY;  Surgeon: Melodi Lerner, MD;  Location: [facility] ORS;  Service: Orthopedics;  Laterality: Right;   TUBAL LIGATION     WRIST SURGERY Right    Patient Active Problem List   Diagnosis Date Noted   S/P craniotomy 01/09/2023   Brain tumor (HCC) 01/09/2023   OA (osteoarthritis) of knee 03/19/2022   Cervical spondylosis with myelopathy and radiculopathy 04/19/2021   Genetic  testing 08/01/2020   Family history of ovarian cancer 07/14/2020   Colon polyps 07/14/2020   Family history of pancreatic cancer 07/14/2020   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   CKD (chronic kidney disease) stage 3, GFR 30-59 ml/min (HCC) 10/01/2017   History of normocytic normochromic anemia 11/25/2015   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   Spondylolisthesis of lumbar region 12/23/2013   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   Hyperthyroidism 05/25/2013   Obesity (BMI 30-39.9) 12/30/2012   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HCC) 08/21/2011   Meningioma (HCC) 08/13/2011   DIZZINESS 11/02/2009   Type 2 diabetes mellitus, controlled (HCC) 05/20/2009   HYPOKALEMIA 05/02/2009   DEPRESSION 06/24/2008   GERD 06/24/2008   PEPTIC ULCER DISEASE 06/24/2008   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  THERAPY DIAG:  Cervicalgia  Abnormal posture  Other muscle spasm  Rationale for Evaluation and Treatment: Rehabilitation  ONSET DATE: Ongoing, surgery (04/20/23.    SUBJECTIVE:                                                                                                                                                                                                         SUBJECTIVE STATEMENT: Felt better after first treatment.  Pain about a 6 today.   PERTINENT HISTORY:  Please see above.    PAIN:  Are you having pain? Yes: NPRS scale: 6/10.  Pain location: Left cervical   Pain description: Sharp, numb.   Aggravating factors: As above.   Relieving factors: As above.    PRECAUTIONS: None  RED  FLAGS: None     WEIGHT BEARING RESTRICTIONS: No  FALLS:  Has patient fallen in last 6 months? No  LIVING ENVIRONMENT: Lives in: House/apartment Has following equipment at home: None  OCCUPATION: Retired.    PLOF: Independent  PATIENT GOALS: Not have pain.     OBJECTIVE:   PATIENT SURVEYS:  NDI:  23/50 (46%).    POSTURE: rounded shoulders and forward head  PALPATION: Tender to palpation over left SCM, left cervical paraspinal musculature and UT with trigger point noted.     CERVICAL ROM:   Active right cervical rotation is 50 degrees and left is 30 degrees.    UPPER EXTREMITY ROM:  WNL.    UPPER EXTREMITY MMT:  WNL.    DTR's:  UE DTR's 1+ to 2+/4+   TREATMENT DATE:      12/10/23:  Small soundhead combo e'stim/US  at 1.50 W/CM2 x 12 minutes to patient's left cervical musculature f/b STW/M x 12 minutes f/b  HMP and IFC at 80-150 Hz on 40% scan x 20 minutes.  Normal modality resposne following removal of modality.                                                                                                                                PATIENT EDUCATION:  Education details: Chin tucks, cervical extension. Person educated: Patient Education method: Medical Illustrator, handout. Education comprehension: verbalized understanding  HOME EXERCISE PROGRAM: NECK  [[id number]]  CERVICAL EXTENSION WITH TOWEL - CURVE OF NECK -  Repeat 5 Repetitions, Hold 2 Seconds, Complete 1 Set, Perform 3 Times a Day  RETRACTION / CHIN TUCK -  Repeat 10 Repetitions, Hold 5 Seconds, Complete 1 Set, Perform 6 Times a Day  ASSESSMENT:  CLINICAL IMPRESSION: Patient felt better upon presentation to the clinic today.  Her SCM was less tender to palpation today.  She felt better after treatment.    OBJECTIVE IMPAIRMENTS: decreased activity tolerance, decreased ROM, increased muscle spasms, postural dysfunction, and pain.   ACTIVITY LIMITATIONS: carrying, lifting, and  sleeping  PARTICIPATION LIMITATIONS: meal prep, cleaning, and laundry  PERSONAL FACTORS: Time since onset of injury/illness/exacerbation and 1 comorbidity: ACDF are also affecting patient's functional outcome.   REHAB POTENTIAL: Good  CLINICAL DECISION MAKING: Stable/uncomplicated  EVALUATION COMPLEXITY: Low   GOALS:  SHORT TERM GOALS: Target date: 12/23/23  Ind with an initial HEP.  Goal status: INITIAL   LONG TERM GOALS: Target date: 01/20/24  Ind with an advanced HEP.  Goal status: INITIAL  2.  Improve bilateral active cervical rotation to 60 degrees so she can turn her head more easily while driving.   Goal status: INITIAL  3.  Perform ADL's  with pain not > 3/10. Goal status: INITIAL  4.  Sleep 6 hours undisturbed.  Goal status: INITIAL  5.  Improve NDI by at least 5 points.  Goal status: INITIAL  PLAN:  PT FREQUENCY: 2x/week  PT DURATION: 6 weeks  PLANNED INTERVENTIONS: 97110-Therapeutic exercises, 97530- Therapeutic activity, [id number]- Neuromuscular re-education, 97535- Self Care, [id number]- Manual therapy, [id number]- Electrical stimulation (unattended), 97035- Ultrasound, Patient/Family education, Cryotherapy, and Moist heat  PLAN FOR NEXT SESSION: Postural exercises, modalities and STW/M as needed.     Caisen Mangas, PT 12/10/2023, 12:08 PM

## 2023-12-17 ENCOUNTER — Ambulatory Visit: Admitting: Physical Therapy

## 2023-12-17 DIAGNOSIS — M62838 Other muscle spasm: Secondary | ICD-10-CM

## 2023-12-17 DIAGNOSIS — M542 Cervicalgia: Secondary | ICD-10-CM

## 2023-12-17 DIAGNOSIS — R293 Abnormal posture: Secondary | ICD-10-CM

## 2023-12-17 NOTE — Therapy (Signed)
 OUTPATIENT PHYSICAL THERAPY CERVICAL TREATMENT  Patient Name: Dawn Salas MRN: [id number] DOB:1940-07-19, 83 y.o., female Today's Date: 12/17/2023  END OF SESSION:  PT End of Session - 12/17/23 1405     Visit Number 3    Number of Visits 12    Date for Recertification  01/20/24    PT Start Time 0103    PT Stop Time 0200    PT Time Calculation (min) 57 min    Activity Tolerance Patient tolerated treatment well    Behavior During Therapy [facility] for tasks assessed/performed           Past Medical History:  Diagnosis Date   Allergy    Anxiety    Arthritis    Asthma    patient denies    Cataract    bilateral lens implants    Colon polyps    Depression    Diabetes mellitus    Type two   Diverticulitis    Double vision 2019   left eye   Family history of adverse reaction to anesthesia    daughter- N/V    Family history of ovarian cancer    Family history of pancreatic cancer    Frequency of urination    GERD (gastroesophageal reflux disease)    History of adverse reaction to anesthesia 03/19/2022   After regional block for TKA pt states she had uncontrollable frequent blinking and zoned out was unable to speak more than one or two words for about 10 minutes   History of hiatal hernia    Hyperglycemia    Hyperlipidemia    Hypertension    Hyperthyroidism    PT HAD BIPOSY -NEGATIVE.SABRA AND NOW JUST GETS CHECKED EACH YEAR .SABRA 2013 LAST TEST   SEES DR. [name].    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 DOES NOT USE C PAP   Palpitations    Peptic ulcer disease    Pneumonia    walking pneumonia hx of   Seizures (HCC) 2015   one siezure with brain surgery none since 2015    Sleep apnea    no CPAP   Urinary tract infection 03/2020   Vertigo    Past Surgical History:  Procedure Laterality Date   ABDOMINAL HYSTERECTOMY     ANTERIOR CERVICAL DECOMP/DISCECTOMY FUSION N/A 04/19/2021   Procedure: Anterior Cervical  Discectomy and Fusion with Interbody Prosthesis, Plates/Screws Cervical Three-Four/Cervical Four-Five REMoval CERVICAL PLATE;  Surgeon: Mavis Purchase, MD;  Location: [facility] OR;  Service: Neurosurgery;  Laterality: N/A;  3C   APPLICATION OF CRANIAL NAVIGATION Left 01/09/2023   Procedure: APPLICATION OF CRANIAL NAVIGATION;  Surgeon: Mavis Purchase, MD;  Location: [facility] OR;  Service: Neurosurgery;  Laterality: Left;   BACK SURGERY     L SPIN 2003   NECK 2004   BRAIN MENINGIOMA EXCISION  08/2008   X 2   C5-C6 neck fusion     CATARACT EXTRACTION Bilateral    COLONOSCOPY  05/05/2009   pt states multiple polyps removed in both colonoscopies she has had   CRANIOTOMY  08/13/2011   Procedure: CRANIOTOMY TUMOR EXCISION;  Surgeon: Purchase JONETTA Mavis, MD;  Location: [facility];  Service: Neurosurgery;  Laterality: Bilateral;  Bifrontal craniotomy for tumor   CRANIOTOMY Left 01/09/2023   Procedure: FRONTAL CRANIOTOMY;  Surgeon: Mavis Purchase, MD;  Location: [facility] OR;  Service: Neurosurgery;  Laterality: Left;   DILATION AND CURETTAGE OF UTERUS  YEARS AGO   L4-L5 posterior la  2021   L3-L5 fusion   left foot plantar and hammertoe     right foot bunectomy     right knee arthroscopy     x 3   right shoulder rotator cuff     ROBOTIC ASSISTED BILATERAL SALPINGO OOPHERECTOMY N/A 08/25/2015   Procedure: XI ROBOTIC ASSISTED BILATERAL SALPINGO OOPHORECTOMY;  Surgeon: Sari Bachelor, MD;  Location: [facility]S;  Service: Gynecology;  Laterality: N/A;   TONSILLECTOMY     TOTAL KNEE ARTHROPLASTY Right 03/19/2022   Procedure: TOTAL KNEE ARTHROPLASTY;  Surgeon: Melodi Lerner, MD;  Location: [facility];  Service: Orthopedics;  Laterality: Right;   TUBAL LIGATION     WRIST SURGERY Right    Patient Active Problem List   Diagnosis Date Noted   S/P craniotomy 01/09/2023   Brain tumor (HCC) 01/09/2023   OA (osteoarthritis) of knee 03/19/2022   Cervical spondylosis with myelopathy and radiculopathy 04/19/2021   Genetic  testing 08/01/2020   Family history of ovarian cancer 07/14/2020   Colon polyps 07/14/2020   Family history of pancreatic cancer 07/14/2020   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   CKD (chronic kidney disease) stage 3, GFR 30-59 ml/min (HCC) 10/01/2017   History of normocytic normochromic anemia 11/25/2015   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   Spondylolisthesis of lumbar region 12/23/2013   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   Hyperthyroidism 05/25/2013   Obesity (BMI 30-39.9) 12/30/2012   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HCC) 08/21/2011   Meningioma (HCC) 08/13/2011   DIZZINESS 11/02/2009   Type 2 diabetes mellitus, controlled (HCC) 05/20/2009   HYPOKALEMIA 05/02/2009   DEPRESSION 06/24/2008   GERD 06/24/2008   PEPTIC ULCER DISEASE 06/24/2008   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  THERAPY DIAG:  Cervicalgia  Abnormal posture  Other muscle spasm  Rationale for Evaluation and Treatment: Rehabilitation  ONSET DATE: Ongoing, surgery (04/20/23.    SUBJECTIVE:                                                                                                                                                                                                         SUBJECTIVE STATEMENT: Feeling better.  PERTINENT HISTORY:  Please see above.    PAIN:  Are  you having pain? Yes: NPRS scale: 6/10.  Pain location: Left cervical   Pain description: Sharp, numb.   Aggravating factors: As above.   Relieving factors: As above.    PRECAUTIONS: None  RED FLAGS: None     WEIGHT BEARING RESTRICTIONS: No  FALLS:   Has patient fallen in last 6 months? No  LIVING ENVIRONMENT: Lives in: House/apartment Has following equipment at home: None  OCCUPATION: Retired.    PLOF: Independent  PATIENT GOALS: Not have pain.     OBJECTIVE:   PATIENT SURVEYS:  NDI:  23/50 (46%).    POSTURE: rounded shoulders and forward head  PALPATION: Tender to palpation over left SCM, left cervical paraspinal musculature and UT with trigger point noted.     CERVICAL ROM:   Active right cervical rotation is 50 degrees and left is 30 degrees.    UPPER EXTREMITY ROM:  WNL.    UPPER EXTREMITY MMT:  WNL.    DTR's:  UE DTR's 1+ to 2+/4+   TREATMENT DATE:   12/17/23;  UBE x 8 minutes f/b scapular retraction with red theraband to faitgue f/b chin tucks and extension x 10 reps f/f STW/M x 18 minutes to patient left cervical musculature and SCM f/b HMP and IFC at 80-150 Hz on 40% scan x 20 minutes.  Normal modality resposne following removal of modality.   12/10/23:  Small soundhead combo e'stim/US  at 1.50 W/CM2 x 12 minutes to patient's left cervical musculature f/b STW/M x 12 minutes f/b  HMP and IFC at 80-150 Hz on 40% scan x 20 minutes.  Normal modality resposne following removal of modality.                                                                                                                                PATIENT EDUCATION:  Education details: Chin tucks, cervical extension. Person educated: Patient Education method: Medical Illustrator, handout. Education comprehension: verbalized understanding  HOME EXERCISE PROGRAM: HOME EXERCISE PROGRAM [[id number]]  Scapular Retraction -  Repeat 15 Repetitions, Hold 2 Seconds, Complete 2 Sets, Perform 2 Times a Day  ASSESSMENT:  CLINICAL IMPRESSION: Patient performed UBE and scapular retraction without complaint.  Red theraband and picture of exercise provided to patient.  Patient felt much better after treatment.    OBJECTIVE IMPAIRMENTS:  decreased activity tolerance, decreased ROM, increased muscle spasms, postural dysfunction, and pain.   ACTIVITY LIMITATIONS: carrying, lifting, and sleeping  PARTICIPATION LIMITATIONS: meal prep, cleaning, and laundry  PERSONAL FACTORS: Time since onset of injury/illness/exacerbation and 1 comorbidity: ACDF are also affecting patient's functional outcome.   REHAB POTENTIAL: Good  CLINICAL DECISION MAKING: Stable/uncomplicated  EVALUATION COMPLEXITY: Low   GOALS:  SHORT TERM GOALS: Target date: 12/23/23  Ind with an initial HEP.  Goal status: INITIAL   LONG TERM GOALS: Target date: 01/20/24  Ind with an advanced HEP.  Goal status: INITIAL  2.  Improve bilateral active cervical rotation to 60 degrees  so she can turn her head more easily while driving.   Goal status: INITIAL  3.  Perform ADL's with pain not > 3/10. Goal status: INITIAL  4.  Sleep 6 hours undisturbed.  Goal status: INITIAL  5.  Improve NDI by at least 5 points.  Goal status: INITIAL  PLAN:  PT FREQUENCY: 2x/week  PT DURATION: 6 weeks  PLANNED INTERVENTIONS: 97110-Therapeutic exercises, 97530- Therapeutic activity, [id number]- Neuromuscular re-education, 97535- Self Care, 02859- Manual therapy, [id number]- Electrical stimulation (unattended), 97035- Ultrasound, Patient/Family education, Cryotherapy, and Moist heat  PLAN FOR NEXT SESSION: Postural exercises, modalities and STW/M as needed.     Robynne Roat, PT 12/17/2023, 2:06 PM

## 2023-12-18 ENCOUNTER — Encounter: Payer: Self-pay | Admitting: Family Medicine

## 2023-12-18 DIAGNOSIS — M159 Polyosteoarthritis, unspecified: Secondary | ICD-10-CM

## 2023-12-23 ENCOUNTER — Ambulatory Visit: Attending: Student | Admitting: Physical Therapy

## 2023-12-23 DIAGNOSIS — M542 Cervicalgia: Secondary | ICD-10-CM | POA: Diagnosis present

## 2023-12-23 DIAGNOSIS — R293 Abnormal posture: Secondary | ICD-10-CM | POA: Diagnosis present

## 2023-12-23 DIAGNOSIS — M62838 Other muscle spasm: Secondary | ICD-10-CM | POA: Insufficient documentation

## 2023-12-23 NOTE — Therapy (Signed)
 OUTPATIENT PHYSICAL THERAPY CERVICAL TREATMENT  Patient Name: Dawn Salas MRN: [id number] DOB:[birth year], 83 y.o., female Today's Date: 12/23/2023  END OF SESSION:  PT End of Session - 12/23/23 1617     Visit Number 4    Number of Visits 12    Date for Recertification  01/20/24    PT Start Time 0230    PT Stop Time 0320    PT Time Calculation (min) 50 min    Activity Tolerance Patient tolerated treatment well    Behavior During Therapy [facility] for tasks assessed/performed           Past Medical History:  Diagnosis Date   Allergy    Anxiety    Arthritis    Asthma    patient denies    Cataract    bilateral lens implants    Colon polyps    Depression    Diabetes mellitus    Type two   Diverticulitis    Double vision 2019   left eye   Family history of adverse reaction to anesthesia    daughter- N/V    Family history of ovarian cancer    Family history of pancreatic cancer    Frequency of urination    GERD (gastroesophageal reflux disease)    History of adverse reaction to anesthesia 03/19/2022   After regional block for TKA pt states she had uncontrollable frequent blinking and zoned out was unable to speak more than one or two words for about 10 minutes   History of hiatal hernia    Hyperglycemia    Hyperlipidemia    Hypertension    Hyperthyroidism    PT HAD BIPOSY -NEGATIVE.SABRA AND NOW JUST GETS CHECKED EACH YEAR .SABRA 2013 LAST TEST   SEES DR. [name].    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 DOES NOT USE C PAP   Palpitations    Peptic ulcer disease    Pneumonia    walking pneumonia hx of   Seizures (HCC) 2015   one siezure with brain surgery none since 2015    Sleep apnea    no CPAP   Urinary tract infection 03/2020   Vertigo    Past Surgical History:  Procedure Laterality Date   ABDOMINAL HYSTERECTOMY     ANTERIOR CERVICAL DECOMP/DISCECTOMY FUSION N/A 04/19/2021   Procedure: Anterior Cervical  Discectomy and Fusion with Interbody Prosthesis, Plates/Screws Cervical Three-Four/Cervical Four-Five REMoval CERVICAL PLATE;  Surgeon: Mavis Purchase, MD;  Location: [facility] OR;  Service: Neurosurgery;  Laterality: N/A;  3C   APPLICATION OF CRANIAL NAVIGATION Left 01/09/2023   Procedure: APPLICATION OF CRANIAL NAVIGATION;  Surgeon: Mavis Purchase, MD;  Location: [facility] OR;  Service: Neurosurgery;  Laterality: Left;   BACK SURGERY     L SPIN 2003   NECK 2004   BRAIN MENINGIOMA EXCISION  08/2008   X 2   C5-C6 neck fusion     CATARACT EXTRACTION Bilateral    COLONOSCOPY  05/05/2009   pt states multiple polyps removed in both colonoscopies she has had   CRANIOTOMY  08/13/2011   Procedure: CRANIOTOMY TUMOR EXCISION;  Surgeon: Purchase JONETTA Mavis, MD;  Location: [facility];  Service: Neurosurgery;  Laterality: Bilateral;  Bifrontal craniotomy for tumor   CRANIOTOMY Left 01/09/2023   Procedure: FRONTAL CRANIOTOMY;  Surgeon: Mavis Purchase, MD;  Location: [facility] OR;  Service: Neurosurgery;  Laterality: Left;   DILATION AND CURETTAGE OF UTERUS  YEARS AGO   L4-L5 posterior la  2021   L3-L5 fusion   left foot plantar and hammertoe     right foot bunectomy     right knee arthroscopy     x 3   right shoulder rotator cuff     ROBOTIC ASSISTED BILATERAL SALPINGO OOPHERECTOMY N/A 08/25/2015   Procedure: XI ROBOTIC ASSISTED BILATERAL SALPINGO OOPHORECTOMY;  Surgeon: Sari Bachelor, MD;  Location: [facility] ORS;  Service: Gynecology;  Laterality: N/A;   TONSILLECTOMY     TOTAL KNEE ARTHROPLASTY Right 03/19/2022   Procedure: TOTAL KNEE ARTHROPLASTY;  Surgeon: Melodi Lerner, MD;  Location: [facility] ORS;  Service: Orthopedics;  Laterality: Right;   TUBAL LIGATION     WRIST SURGERY Right    Patient Active Problem List   Diagnosis Date Noted   S/P craniotomy 01/09/2023   Brain tumor (HCC) 01/09/2023   OA (osteoarthritis) of knee 03/19/2022   Cervical spondylosis with myelopathy and radiculopathy 04/19/2021   Genetic  testing 08/01/2020   Family history of ovarian cancer 07/14/2020   Colon polyps 07/14/2020   Family history of pancreatic cancer 07/14/2020   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   CKD (chronic kidney disease) stage 3, GFR 30-59 ml/min (HCC) 10/01/2017   History of normocytic normochromic anemia 11/25/2015   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   Spondylolisthesis of lumbar region 12/23/2013   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   Hyperthyroidism 05/25/2013   Obesity (BMI 30-39.9) 12/30/2012   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HCC) 08/21/2011   Meningioma (HCC) 08/13/2011   DIZZINESS 11/02/2009   Type 2 diabetes mellitus, controlled (HCC) 05/20/2009   HYPOKALEMIA 05/02/2009   DEPRESSION 06/24/2008   GERD 06/24/2008   PEPTIC ULCER DISEASE 06/24/2008   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  THERAPY DIAG:  Cervicalgia  Abnormal posture  Other muscle spasm  Rationale for Evaluation and Treatment: Rehabilitation  ONSET DATE: Ongoing, surgery (04/20/23.    SUBJECTIVE:                                                                                                                                                                                                         SUBJECTIVE STATEMENT: Want to try dry needling next time.   PERTINENT HISTORY:  Please see above.  PAIN:  Are you having pain? Yes: NPRS scale: 6/10.  Pain location: Left cervical   Pain description: Sharp, numb.   Aggravating factors: As above.   Relieving factors: As above.    PRECAUTIONS: None  RED FLAGS: None     WEIGHT BEARING  RESTRICTIONS: No  FALLS:  Has patient fallen in last 6 months? No  LIVING ENVIRONMENT: Lives in: House/apartment Has following equipment at home: None  OCCUPATION: Retired.    PLOF: Independent  PATIENT GOALS: Not have pain.     OBJECTIVE:   PATIENT SURVEYS:  NDI:  23/50 (46%).    POSTURE: rounded shoulders and forward head  PALPATION: Tender to palpation over left SCM, left cervical paraspinal musculature and UT with trigger point noted.     CERVICAL ROM:   Active right cervical rotation is 50 degrees and left is 30 degrees.    UPPER EXTREMITY ROM:  WNL.    UPPER EXTREMITY MMT:  WNL.    DTR's:  UE DTR's 1+ to 2+/4+   TREATMENT DATE:   12/23/23:  Nustep level 3 x 14 minutes f/b STW/M x 10 minutes f/b HMP and IFC at 80-150 Hz on 40% scan x 20 minutes.  Normal modality resposne following removal of modality.  12/17/23;  UBE x 8 minutes f/b scapular retraction with red theraband to faitgue f/b chin tucks and extension x 10 reps f/f STW/M x 18 minutes to patient left cervical musculature and SCM f/b HMP and IFC at 80-150 Hz on 40% scan x 20 minutes.  Normal modality resposne following removal of modality.   12/10/23:  Small soundhead combo e'stim/US  at 1.50 W/CM2 x 12 minutes to patient's left cervical musculature f/b STW/M x 12 minutes f/b  HMP and IFC at 80-150 Hz on 40% scan x 20 minutes.  Normal modality resposne following removal of modality.                                                                                                                                PATIENT EDUCATION:  Education details: Chin tucks, cervical extension. Person educated: Patient Education method: Medical Illustrator, handout. Education comprehension: verbalized understanding  HOME EXERCISE PROGRAM: HOME EXERCISE PROGRAM [[id number]]  Scapular Retraction -  Repeat 15 Repetitions, Hold 2 Seconds, Complete 2 Sets, Perform 2 Times a Day  ASSESSMENT:  CLINICAL  IMPRESSION: Good response with addition of Nustep today.  Patient plans on trying dry needling next session.    OBJECTIVE IMPAIRMENTS: decreased activity tolerance, decreased ROM, increased muscle spasms, postural dysfunction, and pain.   ACTIVITY LIMITATIONS: carrying, lifting, and sleeping  PARTICIPATION LIMITATIONS: meal prep, cleaning, and laundry  PERSONAL FACTORS: Time since onset of injury/illness/exacerbation and 1 comorbidity: ACDF are also affecting patient's functional outcome.   REHAB POTENTIAL: Good  CLINICAL DECISION MAKING: Stable/uncomplicated  EVALUATION COMPLEXITY: Low   GOALS:  SHORT TERM GOALS: Target date: 12/23/23  Ind with an initial HEP.  Goal status: INITIAL  LONG TERM GOALS: Target date: 01/20/24  Ind with an advanced HEP.  Goal status: INITIAL  2.  Improve bilateral active cervical rotation to 60 degrees so she can turn her head more easily while driving.   Goal status: INITIAL  3.  Perform ADL's with pain not > 3/10. Goal status: INITIAL  4.  Sleep 6 hours undisturbed.  Goal status: INITIAL  5.  Improve NDI by at least 5 points.  Goal status: INITIAL  PLAN:  PT FREQUENCY: 2x/week  PT DURATION: 6 weeks  PLANNED INTERVENTIONS: 97110-Therapeutic exercises, 97530- Therapeutic activity, [id number]- Neuromuscular re-education, 97535- Self Care, [id number]- Manual therapy, [id number]- Electrical stimulation (unattended), 97035- Ultrasound, Patient/Family education, Cryotherapy, and Moist heat  PLAN FOR NEXT SESSION: Postural exercises, modalities and STW/M as needed.     Mirtha Jain, PT 12/23/2023, 4:18 PM

## 2023-12-24 ENCOUNTER — Other Ambulatory Visit: Payer: Self-pay | Admitting: Family Medicine

## 2023-12-24 NOTE — Telephone Encounter (Signed)
 Order was placed and community message was sent to Adine Leer, Avelina Sprung.

## 2023-12-25 ENCOUNTER — Ambulatory Visit: Admitting: Physical Therapy

## 2023-12-25 ENCOUNTER — Other Ambulatory Visit: Payer: Self-pay | Admitting: Family Medicine

## 2023-12-25 DIAGNOSIS — M542 Cervicalgia: Secondary | ICD-10-CM

## 2023-12-25 DIAGNOSIS — R293 Abnormal posture: Secondary | ICD-10-CM

## 2023-12-25 NOTE — Therapy (Signed)
 OUTPATIENT PHYSICAL THERAPY CERVICAL TREATMENT  Patient Name: Dawn Salas MRN: [id number] DOB:Jul 26, 1940, 83 y.o., female Today's Date: 12/25/2023  END OF SESSION:  PT End of Session - 12/25/23 1447     Visit Number 5    Number of Visits 12    Date for Recertification  01/20/24    PT Start Time 0110    PT Stop Time 0211    PT Time Calculation (min) 61 min    Activity Tolerance Patient tolerated treatment well    Behavior During Therapy [facility] for tasks assessed/performed            Past Medical History:  Diagnosis Date   Allergy    Anxiety    Arthritis    Asthma    patient denies    Cataract    bilateral lens implants    Colon polyps    Depression    Diabetes mellitus    Type two   Diverticulitis    Double vision 2019   left eye   Family history of adverse reaction to anesthesia    daughter- N/V    Family history of ovarian cancer    Family history of pancreatic cancer    Frequency of urination    GERD (gastroesophageal reflux disease)    History of adverse reaction to anesthesia 03/19/2022   After regional block for TKA pt states she had uncontrollable frequent blinking and zoned out was unable to speak more than one or two words for about 10 minutes   History of hiatal hernia    Hyperglycemia    Hyperlipidemia    Hypertension    Hyperthyroidism    PT HAD BIPOSY -NEGATIVE.SABRA AND NOW JUST GETS CHECKED EACH YEAR .SABRA 2013 LAST TEST   SEES DR. [name].    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 DOES NOT USE C PAP   Palpitations    Peptic ulcer disease    Pneumonia    walking pneumonia hx of   Seizures (HCC) 2015   one siezure with brain surgery none since 2015    Sleep apnea    no CPAP   Urinary tract infection 03/2020   Vertigo    Past Surgical History:  Procedure Laterality Date   ABDOMINAL HYSTERECTOMY     ANTERIOR CERVICAL DECOMP/DISCECTOMY FUSION N/A 04/19/2021   Procedure: Anterior Cervical  Discectomy and Fusion with Interbody Prosthesis, Plates/Screws Cervical Three-Four/Cervical Four-Five REMoval CERVICAL PLATE;  Surgeon: Mavis Purchase, MD;  Location: [facility] OR;  Service: Neurosurgery;  Laterality: N/A;  3C   APPLICATION OF CRANIAL NAVIGATION Left 01/09/2023   Procedure: APPLICATION OF CRANIAL NAVIGATION;  Surgeon: Mavis Purchase, MD;  Location: [facility] OR;  Service: Neurosurgery;  Laterality: Left;   BACK SURGERY     L SPIN 2003   NECK 2004   BRAIN MENINGIOMA EXCISION  08/2008   X 2   C5-C6 neck fusion     CATARACT EXTRACTION Bilateral    COLONOSCOPY  05/05/2009   pt states multiple polyps removed in both colonoscopies she has had   CRANIOTOMY  08/13/2011   Procedure: CRANIOTOMY TUMOR EXCISION;  Surgeon: Purchase JONETTA Mavis, MD;  Location: [facility];  Service: Neurosurgery;  Laterality: Bilateral;  Bifrontal craniotomy for tumor   CRANIOTOMY Left 01/09/2023   Procedure: FRONTAL CRANIOTOMY;  Surgeon: Mavis Purchase, MD;  Location: [facility] OR;  Service: Neurosurgery;  Laterality: Left;   DILATION AND CURETTAGE OF UTERUS  YEARS AGO   L4-L5 posterior la  2021   L3-L5 fusion   left foot plantar and hammertoe     right foot bunectomy     right knee arthroscopy     x 3   right shoulder rotator cuff     ROBOTIC ASSISTED BILATERAL SALPINGO OOPHERECTOMY N/A 08/25/2015   Procedure: XI ROBOTIC ASSISTED BILATERAL SALPINGO OOPHORECTOMY;  Surgeon: Sari Bachelor, MD;  Location: [facility];  Service: Gynecology;  Laterality: N/A;   TONSILLECTOMY     TOTAL KNEE ARTHROPLASTY Right 03/19/2022   Procedure: TOTAL KNEE ARTHROPLASTY;  Surgeon: Melodi Lerner, MD;  Location: [facility] ORS;  Service: Orthopedics;  Laterality: Right;   TUBAL LIGATION     WRIST SURGERY Right    Patient Active Problem List   Diagnosis Date Noted   S/P craniotomy 01/09/2023   Brain tumor (HCC) 01/09/2023   OA (osteoarthritis) of knee 03/19/2022   Cervical spondylosis with myelopathy and radiculopathy 04/19/2021   Genetic  testing 08/01/2020   Family history of ovarian cancer 07/14/2020   Colon polyps 07/14/2020   Family history of pancreatic cancer 07/14/2020   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   CKD (chronic kidney disease) stage 3, GFR 30-59 ml/min (HCC) 10/01/2017   History of normocytic normochromic anemia 11/25/2015   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   Spondylolisthesis of lumbar region 12/23/2013   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   Hyperthyroidism 05/25/2013   Obesity (BMI 30-39.9) 12/30/2012   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HCC) 08/21/2011   Meningioma (HCC) 08/13/2011   DIZZINESS 11/02/2009   Type 2 diabetes mellitus, controlled (HCC) 05/20/2009   HYPOKALEMIA 05/02/2009   DEPRESSION 06/24/2008   GERD 06/24/2008   PEPTIC ULCER DISEASE 06/24/2008   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  THERAPY DIAG:  Cervicalgia  Abnormal posture  Rationale for Evaluation and Treatment: Rehabilitation  ONSET DATE: Ongoing, surgery (04/20/23.    SUBJECTIVE:                                                                                                                                                                                                         SUBJECTIVE STATEMENT: Want to try dry needling next time.   PERTINENT HISTORY:  Please see above.    PAIN:  Are you having pain? Yes: NPRS scale: 6/10.  Pain location: Left cervical   Pain description: Sharp, numb.   Aggravating factors: As above.   Relieving factors: As above.    PRECAUTIONS: None  RED FLAGS: None     WEIGHT BEARING RESTRICTIONS: No  FALLS:  Has  patient fallen in last 6 months? No  LIVING ENVIRONMENT: Lives in: House/apartment Has following equipment at home: None  OCCUPATION: Retired.    PLOF: Independent  PATIENT GOALS: Not have pain.     OBJECTIVE:   PATIENT SURVEYS:  NDI:  23/50 (46%).    POSTURE: rounded shoulders and forward head  PALPATION: Tender to palpation over left SCM, left cervical paraspinal musculature and UT with trigger point noted.     CERVICAL ROM:   Active right cervical rotation is 50 degrees and left is 30 degrees.    UPPER EXTREMITY ROM:  WNL.    UPPER EXTREMITY MMT:  WNL.    DTR's:  UE DTR's 1+ to 2+/4+   TREATMENT DATE:   12/25/23:  Trigger Point Dry Needling  Initial Treatment: Pt instructed on Dry Needling rational, procedures, and possible side effects. Pt instructed to expect mild to moderate muscle soreness later in the day and/or into the next day.  Pt instructed in methods to reduce muscle soreness. Pt instructed to continue prescribed HEP. Patient verbalized understanding of these instructions and education.   Patient Verbal Consent Given: Yes Education Handout Provided: Yes Muscles Treated: Upper left splenius Capitus/Cervicus and cervical multidus.    Trigger Point Dry Needling  What is Trigger Point Dry Needling (DN)? DN is a physical therapy technique used to treat muscle pain and dysfunction. Specifically, DN helps deactivate muscle trigger points (muscle knots).  A thin filiform needle is used to penetrate the skin and stimulate the underlying trigger point. The goal is for a local twitch response (LTR) to occur and for the trigger point to relax. No medication of any kind is injected during the procedure.  Benefits of Trigger Point Dry Needling Reduces localized tension and stiffness promoting muscle function. Promotes range of motion and flexibility of the area being treated. Relieves localized and referred muscle related pain.  Promotes localized blood  flow. Benefits of DN increase when performed with formal therapy including strengthening and stretching.   What Does Trigger Point Dry Needling Feel Like?  The procedure feels different for each individual patient. Some patients report that they do not actually feel the needle enter the skin and overall the process is not painful. Very mild bleeding may occur. However, many patients feel a deep cramping in the muscle in which the needle was inserted. This is the local twitch response.   How Will I feel after the treatment? Soreness is normal, and the onset of soreness may not occur for a few hours. Typically this soreness does not last longer than two days.  Bruising is uncommon, however; ice can be used to decrease any possible bruising.  In rare cases feeling tired or nauseous after the treatment is normal. In addition, your symptoms may get worse before they get better, this period will typically not last longer than 24 hours.   What Can I do After My Treatment? Increase your hydration by drinking more water  for the next 24 hours.  You may place ice or heat on the areas treated that have become sore, however, do not use heat on inflamed or bruised areas. Heat often brings more relief post needling. You can continue your  regular activities, but vigorous activity is not recommended initially after the treatment for 24 hours. DN is best combined with other physical therapy such as strengthening, stretching, and other therapies.   What are the complications? While your therapist has had extensive training in minimizing the risks of trigger point dry needling, it is important to understand the risks of any procedure.  Risks include bleeding, pain, fatigue, hematoma, infection, vertigo, nausea or nerve involvement. Monitor for any changes to your skin or sensation. Contact your therapist or MD with concerns.  A rare but serious complication is a pneumothorax over or near your middle and upper chest  and back If you have dry needling in this area, monitor for the following symptoms: Shortness of breath on exertion and/or Difficulty taking a deep breath and/or Chest Pain and/or A dry cough If any of the above symptoms develop, please go to the nearest emergency room or call 911. Tell them you had dry needling over your thorax and report any symptoms you are having. Please follow-up with your treating therapist after you complete the medical evaluation.  12/25/23:  DN f/b STW/M 23 minutes f/b IFC at 80-150 Hz on 40% scan x 20 minutes.  Normal modality resposne following removal of modality.  Normal modality resposne following removal of modality.   12/23/23:  Nustep level 3 x 14 minutes f/b STW/M x 10 minutes f/b HMP and IFC at 80-150 Hz on 40% scan x 20 minutes.  Normal modality resposne following removal of modality.  12/17/23;  UBE x 8 minutes f/b scapular retraction with red theraband to faitgue f/b chin tucks and extension x 10 reps f/f STW/M x 18 minutes to patient left cervical musculature and SCM f/b HMP and IFC at 80-150 Hz on 40% scan x 20 minutes.  Normal modality resposne following removal of modality.   12/10/23:  Small soundhead combo e'stim/US  at 1.50 W/CM2 x 12 minutes to patient's left cervical musculature f/b STW/M x 12 minutes f/b  HMP and IFC at 80-150 Hz on 40% scan x 20 minutes.  Normal modality resposne following removal of modality.                                                                                                                                PATIENT EDUCATION:  Education details: Chin tucks, cervical extension. Person educated: Patient Education method: Medical Illustrator, handout. Education comprehension: verbalized understanding  HOME EXERCISE PROGRAM: HOME EXERCISE PROGRAM [RLNRZDP]  Scapular Retraction -  Repeat 15 Repetitions, Hold 2 Seconds, Complete 2 Sets, Perform 2 Times a Day  ASSESSMENT:  CLINICAL IMPRESSION: Patient  tolerated dry needling to her left upper cervical musculature as upper left splenius Capitus/Cervicus and cervical multidus without complaint. She stated feeling much better after treatment.  OBJECTIVE IMPAIRMENTS: decreased activity tolerance, decreased ROM, increased muscle spasms, postural dysfunction, and pain.   ACTIVITY LIMITATIONS: carrying, lifting, and sleeping  PARTICIPATION LIMITATIONS: meal prep, cleaning, and laundry  PERSONAL FACTORS: Time since onset of injury/illness/exacerbation and 1 comorbidity: ACDF are also affecting patient's functional outcome.   REHAB POTENTIAL: Good  CLINICAL DECISION MAKING: Stable/uncomplicated  EVALUATION COMPLEXITY: Low   GOALS:  SHORT TERM GOALS: Target date: 12/23/23  Ind with an initial HEP.  Goal status: INITIAL   LONG TERM GOALS: Target date: 01/20/24  Ind with an advanced HEP.  Goal status: INITIAL  2.  Improve bilateral active cervical rotation to 60 degrees so she can turn her head more easily while driving.   Goal status: INITIAL  3.  Perform ADL's with pain not > 3/10. Goal status: INITIAL  4.  Sleep 6 hours undisturbed.  Goal status: INITIAL  5.  Improve NDI by at least 5 points.  Goal status: INITIAL  PLAN:  PT FREQUENCY: 2x/week  PT DURATION: 6 weeks  PLANNED INTERVENTIONS: 97110-Therapeutic exercises, 97530- Therapeutic activity, [id number]- Neuromuscular re-education, 97535- Self Care, 02859- Manual therapy, [id number]- Electrical stimulation (unattended), 97035- Ultrasound, Patient/Family education, Cryotherapy, and Moist heat  PLAN FOR NEXT SESSION: Postural exercises, modalities and STW/M as needed.     Dolly Harbach, PT 12/25/2023, 2:55 PM

## 2023-12-31 ENCOUNTER — Ambulatory Visit: Admitting: *Deleted

## 2023-12-31 DIAGNOSIS — M542 Cervicalgia: Secondary | ICD-10-CM | POA: Diagnosis not present

## 2023-12-31 DIAGNOSIS — M62838 Other muscle spasm: Secondary | ICD-10-CM

## 2023-12-31 DIAGNOSIS — R293 Abnormal posture: Secondary | ICD-10-CM

## 2023-12-31 NOTE — Therapy (Signed)
 OUTPATIENT PHYSICAL THERAPY CERVICAL TREATMENT  Patient Name: Dawn Salas MRN: [id number] DOB:05/17/1940, 83 y.o., female Today's Date: 12/31/2023  END OF SESSION:  PT End of Session - 12/31/23 1304     Visit Number 6    Number of Visits 12    Date for Recertification  01/20/24    PT Start Time 1300    PT Stop Time 1340    PT Time Calculation (min) 40 min    Activity Tolerance Patient tolerated treatment well    Behavior During Therapy [facility] for tasks assessed/performed             Past Medical History:  Diagnosis Date   Allergy    Anxiety    Arthritis    Asthma    patient denies    Cataract    bilateral lens implants    Colon polyps    Depression    Diabetes mellitus    Type two   Diverticulitis    Double vision 2019   left eye   Family history of adverse reaction to anesthesia    daughter- N/V    Family history of ovarian cancer    Family history of pancreatic cancer    Frequency of urination    GERD (gastroesophageal reflux disease)    History of adverse reaction to anesthesia 03/19/2022   After regional block for TKA pt states she had uncontrollable frequent blinking and zoned out was unable to speak more than one or two words for about 10 minutes   History of hiatal hernia    Hyperglycemia    Hyperlipidemia    Hypertension    Hyperthyroidism    PT HAD BIPOSY -NEGATIVE.SABRA AND NOW JUST GETS CHECKED EACH YEAR .SABRA 2013 LAST TEST   SEES DR. [name].    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 DOES NOT USE C PAP   Palpitations    Peptic ulcer disease    Pneumonia    walking pneumonia hx of   Seizures (HCC) 2015   one siezure with brain surgery none since 2015    Sleep apnea    no CPAP   Urinary tract infection 03/2020   Vertigo    Past Surgical History:  Procedure Laterality Date   ABDOMINAL HYSTERECTOMY     ANTERIOR CERVICAL DECOMP/DISCECTOMY FUSION N/A 04/19/2021   Procedure: Anterior  Cervical Discectomy and Fusion with Interbody Prosthesis, Plates/Screws Cervical Three-Four/Cervical Four-Five REMoval CERVICAL PLATE;  Surgeon: Mavis Purchase, MD;  Location: [facility] OR;  Service: Neurosurgery;  Laterality: N/A;  3C   APPLICATION OF CRANIAL NAVIGATION Left 01/09/2023   Procedure: APPLICATION OF CRANIAL NAVIGATION;  Surgeon: Mavis Purchase, MD;  Location: [facility] OR;  Service: Neurosurgery;  Laterality: Left;   BACK SURGERY     L SPIN 2003   NECK 2004   BRAIN MENINGIOMA EXCISION  08/2008   X 2   C5-C6 neck fusion     CATARACT EXTRACTION Bilateral    COLONOSCOPY  05/05/2009   pt states multiple polyps removed in both colonoscopies she has had   CRANIOTOMY  08/13/2011   Procedure: CRANIOTOMY TUMOR EXCISION;  Surgeon: Purchase JONETTA Mavis, MD;  Location: [facility];  Service: Neurosurgery;  Laterality: Bilateral;  Bifrontal craniotomy for tumor   CRANIOTOMY Left 01/09/2023   Procedure: FRONTAL CRANIOTOMY;  Surgeon: Mavis Purchase, MD;  Location: [facility] OR;  Service: Neurosurgery;  Laterality: Left;   DILATION AND CURETTAGE OF UTERUS  YEARS AGO   L4-L5 posterior la  2021   L3-L5 fusion   left foot plantar and hammertoe     right foot bunectomy     right knee arthroscopy     x 3   right shoulder rotator cuff     ROBOTIC ASSISTED BILATERAL SALPINGO OOPHERECTOMY N/A 08/25/2015   Procedure: XI ROBOTIC ASSISTED BILATERAL SALPINGO OOPHORECTOMY;  Surgeon: Sari Bachelor, MD;  Location: [facility];  Service: Gynecology;  Laterality: N/A;   TONSILLECTOMY     TOTAL KNEE ARTHROPLASTY Right 03/19/2022   Procedure: TOTAL KNEE ARTHROPLASTY;  Surgeon: Melodi Lerner, MD;  Location: [facility];  Service: Orthopedics;  Laterality: Right;   TUBAL LIGATION     WRIST SURGERY Right    Patient Active Problem List   Diagnosis Date Noted   S/P craniotomy 01/09/2023   Brain tumor (HCC) 01/09/2023   OA (osteoarthritis) of knee 03/19/2022   Cervical spondylosis with myelopathy and radiculopathy 04/19/2021    Genetic testing 08/01/2020   Family history of ovarian cancer 07/14/2020   Colon polyps 07/14/2020   Family history of pancreatic cancer 07/14/2020   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   CKD (chronic kidney disease) stage 3, GFR 30-59 ml/min (HCC) 10/01/2017   History of normocytic normochromic anemia 11/25/2015   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   Spondylolisthesis of lumbar region 12/23/2013   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   Hyperthyroidism 05/25/2013   Obesity (BMI 30-39.9) 12/30/2012   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HCC) 08/21/2011   Meningioma (HCC) 08/13/2011   DIZZINESS 11/02/2009   Type 2 diabetes mellitus, controlled (HCC) 05/20/2009   HYPOKALEMIA 05/02/2009   DEPRESSION 06/24/2008   GERD 06/24/2008   PEPTIC ULCER DISEASE 06/24/2008   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  THERAPY DIAG:  Cervicalgia  Abnormal posture  Other muscle spasm  Rationale for Evaluation and Treatment: Rehabilitation  ONSET DATE: Ongoing, surgery (04/20/23.    SUBJECTIVE:                                                                                                                                                                                                         SUBJECTIVE STATEMENT: Pt states she felt better after the needling. Still has some pain when looking down  along the left side of her neck.   PERTINENT HISTORY:  Please see above.    PAIN:  Are you having pain? Yes: NPRS scale: 3-4/10.  Pain location: Left cervical   Pain description: Sharp, numb.   Aggravating factors: As above.   Relieving  factors: As above.    PRECAUTIONS: None  RED FLAGS: None     WEIGHT BEARING RESTRICTIONS: No  FALLS:  Has patient fallen in last 6 months? No  LIVING ENVIRONMENT: Lives in: House/apartment Has following equipment at home: None  OCCUPATION: Retired.    PLOF: Independent  PATIENT GOALS: Not have pain.     OBJECTIVE:   PATIENT SURVEYS:  NDI:  23/50 (46%).    POSTURE: rounded shoulders and forward head  PALPATION: Tender to palpation over left SCM, left cervical paraspinal musculature and UT with trigger point noted.     CERVICAL ROM:  Active right cervical rotation is 50 degrees and left is 30 degrees.    UPPER EXTREMITY ROM: WNL.    UPPER EXTREMITY MMT: WNL.    DTR's:  UE DTR's 1+ to 2+/4+   TREATMENT DATE:  12/31/23: UBE 5 min fwd, 5 min bwd STM & TPR cervical paraspinals, UTs, levator, splenius capitus/cervicus Grade II to III lateral cervical glides and rotation Cervical rotation and flexion/ext x5 with MWM Trigger Point Dry Needling  Subsequent Treatment: Instructions provided previously at initial dry needling treatment.   Patient Verbal Consent Given: Yes Education Handout Provided: Previously Provided Muscles Treated: Upper left splenius Capitus/Cervicus and cervical multidus. Electrical Stimulation Performed: No Treatment Response/Outcome: decreased muscle tension, twitch response  Premod to pt tolerance L lateral neck/shoulder x 20 minutes   12/25/23: Trigger Point Dry Needling  Initial Treatment: Pt instructed on Dry Needling rational, procedures, and possible side effects. Pt instructed to expect mild to moderate muscle soreness later in the day and/or into the next day.  Pt instructed in methods to reduce muscle soreness. Pt instructed to continue prescribed HEP. Patient verbalized understanding of these instructions and education.   Patient Verbal Consent Given: Yes Education Handout Provided: Yes Muscles Treated: Upper left  splenius Capitus/Cervicus and cervical multidus.    Trigger Point Dry Needling  What is Trigger Point Dry Needling (DN)? DN is a physical therapy technique used to treat muscle pain and dysfunction. Specifically, DN helps deactivate muscle trigger points (muscle knots).  A thin filiform needle is used to penetrate the skin and stimulate the underlying trigger point. The goal is for a local twitch response (LTR) to occur and for the trigger point to relax. No medication of any kind is injected during the procedure.  Benefits of Trigger Point Dry Needling Reduces localized tension and stiffness promoting muscle function. Promotes range of motion and flexibility of the area being treated. Relieves localized and referred muscle related pain.  Promotes localized blood flow. Benefits of DN increase when performed with formal therapy including strengthening and stretching.   What Does Trigger Point Dry Needling Feel Like?  The procedure feels different for each individual patient. Some patients report that they do not actually feel the needle enter the skin and overall the process is not painful. Very mild bleeding may occur. However, many patients feel a deep cramping in the muscle in which the needle was inserted. This is the local twitch response.   How Will I feel after the treatment? Soreness is normal, and the onset of soreness may not occur for a few hours. Typically this soreness does not last longer  than two days.  Bruising is uncommon, however; ice can be used to decrease any possible bruising.  In rare cases feeling tired or nauseous after the treatment is normal. In addition, your symptoms may get worse before they get better, this period will typically not last longer than 24 hours.   What Can I do After My Treatment? Increase your hydration by drinking more water  for the next 24 hours.  You may place ice or heat on the areas treated that have become sore, however, do not use heat on  inflamed or bruised areas. Heat often brings more relief post needling. You can continue your regular activities, but vigorous activity is not recommended initially after the treatment for 24 hours. DN is best combined with other physical therapy such as strengthening, stretching, and other therapies.   What are the complications? While your therapist has had extensive training in minimizing the risks of trigger point dry needling, it is important to understand the risks of any procedure.  Risks include bleeding, pain, fatigue, hematoma, infection, vertigo, nausea or nerve involvement. Monitor for any changes to your skin or sensation. Contact your therapist or MD with concerns.  A rare but serious complication is a pneumothorax over or near your middle and upper chest and back If you have dry needling in this area, monitor for the following symptoms: Shortness of breath on exertion and/or Difficulty taking a deep breath and/or Chest Pain and/or A dry cough If any of the above symptoms develop, please go to the nearest emergency room or call 911. Tell them you had dry needling over your thorax and report any symptoms you are having. Please follow-up with your treating therapist after you complete the medical evaluation.  12/25/23:  DN f/b STW/M 23 minutes f/b IFC at 80-150 Hz on 40% scan x 20 minutes.  Normal modality resposne following removal of modality.  Normal modality resposne following removal of modality.   12/23/23:  Nustep level 3 x 14 minutes f/b STW/M x 10 minutes f/b HMP and IFC at 80-150 Hz on 40% scan x 20 minutes.  Normal modality resposne following removal of modality.  12/17/23;  UBE x 8 minutes f/b scapular retraction with red theraband to faitgue f/b chin tucks and extension x 10 reps f/f STW/M x 18 minutes to patient left cervical musculature and SCM f/b HMP and IFC at 80-150 Hz on 40% scan x 20 minutes.  Normal modality resposne following removal of modality.   12/10/23:   Small soundhead combo e'stim/US  at 1.50 W/CM2 x 12 minutes to patient's left cervical musculature f/b STW/M x 12 minutes f/b  HMP and IFC at 80-150 Hz on 40% scan x 20 minutes.  Normal modality resposne following removal of modality.                                                                                                                                PATIENT EDUCATION:  Education  details: Chin tucks, cervical extension. Person educated: Patient Education method: Medical Illustrator, handout. Education comprehension: verbalized understanding  HOME EXERCISE PROGRAM: HOME EXERCISE PROGRAM [[id number]]  Scapular Retraction -  Repeat 15 Repetitions, Hold 2 Seconds, Complete 2 Sets, Perform 2 Times a Day  ASSESSMENT:  CLINICAL IMPRESSION: Pt reports improved pain after trigger point dry needling this session. Notes no pain with cervical flexion. Continued gentle cervical ROM and manual therapy.   OBJECTIVE IMPAIRMENTS: decreased activity tolerance, decreased ROM, increased muscle spasms, postural dysfunction, and pain.   ACTIVITY LIMITATIONS: carrying, lifting, and sleeping  PARTICIPATION LIMITATIONS: meal prep, cleaning, and laundry  PERSONAL FACTORS: Time since onset of injury/illness/exacerbation and 1 comorbidity: ACDF are also affecting patient's functional outcome.   REHAB POTENTIAL: Good  CLINICAL DECISION MAKING: Stable/uncomplicated  EVALUATION COMPLEXITY: Low   GOALS:  SHORT TERM GOALS: Target date: 12/23/23  Ind with an initial HEP.  Goal status: INITIAL   LONG TERM GOALS: Target date: 01/20/24  Ind with an advanced HEP.  Goal status: INITIAL  2.  Improve bilateral active cervical rotation to 60 degrees so she can turn her head more easily while driving.   Goal status: INITIAL  3.  Perform ADL's with pain not > 3/10. Goal status: INITIAL  4.  Sleep 6 hours undisturbed.  Goal status: INITIAL  5.  Improve NDI by at least 5 points.  Goal  status: INITIAL  PLAN:  PT FREQUENCY: 2x/week  PT DURATION: 6 weeks  PLANNED INTERVENTIONS: 97110-Therapeutic exercises, 97530- Therapeutic activity, [id number]- Neuromuscular re-education, 97535- Self Care, [id number]- Manual therapy, [id number]- Electrical stimulation (unattended), 97035- Ultrasound, Patient/Family education, Cryotherapy, and Moist heat  PLAN FOR NEXT SESSION: Postural exercises, modalities and STW/M as needed.     Naethan Bracewell April Ma L Prather Failla, PT 12/31/2023, 1:45 PM

## 2024-01-02 ENCOUNTER — Ambulatory Visit: Admitting: *Deleted

## 2024-01-02 ENCOUNTER — Encounter: Payer: Self-pay | Admitting: *Deleted

## 2024-01-02 DIAGNOSIS — M542 Cervicalgia: Secondary | ICD-10-CM | POA: Diagnosis not present

## 2024-01-02 DIAGNOSIS — M62838 Other muscle spasm: Secondary | ICD-10-CM

## 2024-01-02 DIAGNOSIS — R293 Abnormal posture: Secondary | ICD-10-CM

## 2024-01-02 NOTE — Therapy (Signed)
 OUTPATIENT PHYSICAL THERAPY CERVICAL TREATMENT  Patient Name: Dawn Salas MRN: [id number] DOB:1940/08/15, 83 y.o., female Today's Date: 01/02/2024  END OF SESSION:  PT End of Session - 01/02/24 1306     Visit Number 7    Number of Visits 12    Date for Recertification  01/20/24    PT Start Time 1302    PT Stop Time 1400    PT Time Calculation (min) 58 min             Past Medical History:  Diagnosis Date   Allergy    Anxiety    Arthritis    Asthma    patient denies    Cataract    bilateral lens implants    Colon polyps    Depression    Diabetes mellitus    Type two   Diverticulitis    Double vision 2019   left eye   Family history of adverse reaction to anesthesia    daughter- N/V    Family history of ovarian cancer    Family history of pancreatic cancer    Frequency of urination    GERD (gastroesophageal reflux disease)    History of adverse reaction to anesthesia 03/19/2022   After regional block for TKA pt states she had uncontrollable frequent blinking and zoned out was unable to speak more than one or two words for about 10 minutes   History of hiatal hernia    Hyperglycemia    Hyperlipidemia    Hypertension    Hyperthyroidism    PT HAD BIPOSY -NEGATIVE.SABRA AND NOW JUST GETS CHECKED EACH YEAR .SABRA 2013 LAST TEST   SEES DR. [name].    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 DOES NOT USE C PAP   Palpitations    Peptic ulcer disease    Pneumonia    walking pneumonia hx of   Seizures (HCC) 2015   one siezure with brain surgery none since 2015    Sleep apnea    no CPAP   Urinary tract infection 03/2020   Vertigo    Past Surgical History:  Procedure Laterality Date   ABDOMINAL HYSTERECTOMY     ANTERIOR CERVICAL DECOMP/DISCECTOMY FUSION N/A 04/19/2021   Procedure: Anterior Cervical Discectomy and Fusion with Interbody Prosthesis, Plates/Screws Cervical Three-Four/Cervical Four-Five REMoval  CERVICAL PLATE;  Surgeon: Mavis Purchase, MD;  Location: [facility] OR;  Service: Neurosurgery;  Laterality: N/A;  3C   APPLICATION OF CRANIAL NAVIGATION Left 01/09/2023   Procedure: APPLICATION OF CRANIAL NAVIGATION;  Surgeon: Mavis Purchase, MD;  Location: [facility] OR;  Service: Neurosurgery;  Laterality: Left;   BACK SURGERY     L SPIN 2003   NECK 2004   BRAIN MENINGIOMA EXCISION  08/2008   X 2   C5-C6 neck fusion     CATARACT EXTRACTION Bilateral    COLONOSCOPY  05/05/2009   pt states multiple polyps removed in both colonoscopies she has had   CRANIOTOMY  08/13/2011   Procedure: CRANIOTOMY TUMOR EXCISION;  Surgeon: Purchase JONETTA Mavis, MD;  Location: [facility];  Service: Neurosurgery;  Laterality: Bilateral;  Bifrontal craniotomy for tumor   CRANIOTOMY Left 01/09/2023   Procedure: FRONTAL CRANIOTOMY;  Surgeon: Mavis Purchase, MD;  Location: [facility] OR;  Service: Neurosurgery;  Laterality: Left;   DILATION AND CURETTAGE OF UTERUS     YEARS AGO   L4-L5 posterior la  2021   L3-L5 fusion   left  foot plantar and hammertoe     right foot bunectomy     right knee arthroscopy     x 3   right shoulder rotator cuff     ROBOTIC ASSISTED BILATERAL SALPINGO OOPHERECTOMY N/A 08/25/2015   Procedure: XI ROBOTIC ASSISTED BILATERAL SALPINGO OOPHORECTOMY;  Surgeon: Sari Bachelor, MD;  Location: [facility];  Service: Gynecology;  Laterality: N/A;   TONSILLECTOMY     TOTAL KNEE ARTHROPLASTY Right 03/19/2022   Procedure: TOTAL KNEE ARTHROPLASTY;  Surgeon: Melodi Lerner, MD;  Location: [facility];  Service: Orthopedics;  Laterality: Right;   TUBAL LIGATION     WRIST SURGERY Right    Patient Active Problem List   Diagnosis Date Noted   S/P craniotomy 01/09/2023   Brain tumor (HCC) 01/09/2023   OA (osteoarthritis) of knee 03/19/2022   Cervical spondylosis with myelopathy and radiculopathy 04/19/2021   Genetic testing 08/01/2020   Family history of ovarian cancer 07/14/2020   Colon polyps 07/14/2020   Family  history of pancreatic cancer 07/14/2020   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   CKD (chronic kidney disease) stage 3, GFR 30-59 ml/min (HCC) 10/01/2017   History of normocytic normochromic anemia 11/25/2015   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   Spondylolisthesis of lumbar region 12/23/2013   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   Hyperthyroidism 05/25/2013   Obesity (BMI 30-39.9) 12/30/2012   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HCC) 08/21/2011   Meningioma (HCC) 08/13/2011   DIZZINESS 11/02/2009   Type 2 diabetes mellitus, controlled (HCC) 05/20/2009   HYPOKALEMIA 05/02/2009   DEPRESSION 06/24/2008   GERD 06/24/2008   PEPTIC ULCER DISEASE 06/24/2008   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  THERAPY DIAG:  Cervicalgia  Abnormal posture  Other muscle spasm  Rationale for Evaluation and Treatment: Rehabilitation  ONSET DATE: Ongoing, surgery (04/20/23.    SUBJECTIVE:                                                                                                                                                                                                         SUBJECTIVE STATEMENT: Pt states she felt better after the needling. Can turn farther now and sleep better  PERTINENT HISTORY:  Please see above.    PAIN:  Are you having pain?  Yes: NPRS scale: 3-4/10.  Pain location: Left cervical   Pain description: Sharp, numb.   Aggravating factors: As above.   Relieving factors: As above.    PRECAUTIONS: None  RED FLAGS: None     WEIGHT BEARING RESTRICTIONS: No  FALLS:  Has patient fallen in last 6  months? No  LIVING ENVIRONMENT: Lives in: House/apartment Has following equipment at home: None  OCCUPATION: Retired.    PLOF: Independent  PATIENT GOALS: Not have pain.     OBJECTIVE:   PATIENT SURVEYS:  NDI:  23/50 (46%).    POSTURE: rounded shoulders and forward head  PALPATION: Tender to palpation over left SCM, left cervical paraspinal musculature and UT with trigger point noted.     CERVICAL ROM:  Active right cervical rotation is 50 degrees and left is 30 degrees.    UPPER EXTREMITY ROM: WNL.    UPPER EXTREMITY MMT: WNL.    DTR's:  UE DTR's 1+ to 2+/4+   TREATMENT DATE:     Neck 01/02/24: UBE 5 min fwd, 5 min bwd at 90 RPMs  reviewed HEP STM & TPR cervical paraspinals, UTs, levator, splenius capitus/cervicus US  combo 1.5 w/cm2 x 7 mins LT [place] Trigger Point Dry Needling  Subsequent Treatment: Instructions provided previously at initial dry needling treatment.   Patient Verbal Consent Given: Yes Education Handout Provided: Previously Provided Muscles Treated: Upper left splenius Capitus/Cervicus and cervical multidus. Electrical Stimulation Performed: No Treatment Response/Outcome: decreased muscle tension, twitch response  Premod to pt tolerance L lateral neck/shoulder x 20 minutes   12/25/23: Trigger Point Dry Needling  Initial Treatment: Pt instructed on Dry Needling rational, procedures, and possible side effects. Pt instructed to expect mild to moderate muscle soreness later in the day and/or into the next day.  Pt instructed in methods to reduce muscle soreness. Pt instructed to continue prescribed HEP. Patient verbalized understanding of these instructions and education.   Patient Verbal Consent Given: Yes Education Handout Provided: Yes Muscles Treated: Upper left splenius Capitus/Cervicus and cervical multidus.    Trigger Point Dry Needling  What is Trigger Point Dry Needling (DN)? DN is a physical therapy technique used to treat  muscle pain and dysfunction. Specifically, DN helps deactivate muscle trigger points (muscle knots).  A thin filiform needle is used to penetrate the skin and stimulate the underlying trigger point. The goal is for a local twitch response (LTR) to occur and for the trigger point to relax. No medication of any kind is injected during the procedure.  Benefits of Trigger Point Dry Needling Reduces localized tension and stiffness promoting muscle function. Promotes range of motion and flexibility of the area being treated. Relieves localized and referred muscle related pain.  Promotes localized blood flow. Benefits of DN increase when performed with formal therapy including strengthening and stretching.   What Does Trigger Point Dry Needling Feel Like?  The procedure feels different for each individual patient. Some patients report that they do not actually feel the needle enter the skin and overall the process is not painful. Very mild bleeding may occur. However, many patients feel a deep cramping in the muscle in which the needle was inserted. This is the local twitch response.   How Will I feel after the treatment? Soreness is normal, and the onset of soreness may not occur for a few hours. Typically this soreness does not last longer than two days.  Bruising is uncommon, however; ice can be used to decrease any possible bruising.  In rare  cases feeling tired or nauseous after the treatment is normal. In addition, your symptoms may get worse before they get better, this period will typically not last longer than 24 hours.   What Can I do After My Treatment? Increase your hydration by drinking more water  for the next 24 hours.  You may place ice or heat on the areas treated that have become sore, however, do not use heat on inflamed or bruised areas. Heat often brings more relief post needling. You can continue your regular activities, but vigorous activity is not recommended initially after the  treatment for 24 hours. DN is best combined with other physical therapy such as strengthening, stretching, and other therapies.   What are the complications? While your therapist has had extensive training in minimizing the risks of trigger point dry needling, it is important to understand the risks of any procedure.  Risks include bleeding, pain, fatigue, hematoma, infection, vertigo, nausea or nerve involvement. Monitor for any changes to your skin or sensation. Contact your therapist or MD with concerns.  A rare but serious complication is a pneumothorax over or near your middle and upper chest and back If you have dry needling in this area, monitor for the following symptoms: Shortness of breath on exertion and/or Difficulty taking a deep breath and/or Chest Pain and/or A dry cough If any of the above symptoms develop, please go to the nearest emergency room or call 911. Tell them you had dry needling over your thorax and report any symptoms you are having. Please follow-up with your treating therapist after you complete the medical evaluation.  12/25/23:  DN f/b STW/M 23 minutes f/b IFC at 80-150 Hz on 40% scan x 20 minutes.  Normal modality resposne following removal of modality.  Normal modality resposne following removal of modality.   12/23/23:  Nustep level 3 x 14 minutes f/b STW/M x 10 minutes f/b HMP and IFC at 80-150 Hz on 40% scan x 20 minutes.  Normal modality resposne following removal of modality.  12/17/23;  UBE x 8 minutes f/b scapular retraction with red theraband to faitgue f/b chin tucks and extension x 10 reps f/f STW/M x 18 minutes to patient left cervical musculature and SCM f/b HMP and IFC at 80-150 Hz on 40% scan x 20 minutes.  Normal modality resposne following removal of modality.   12/10/23:  Small soundhead combo e'stim/US  at 1.50 W/CM2 x 12 minutes to patient's left cervical musculature f/b STW/M x 12 minutes f/b  HMP and IFC at 80-150 Hz on 40% scan x 20 minutes.   Normal modality resposne following removal of modality.                                                                                                                                PATIENT EDUCATION:  Education details: Chin tucks, cervical extension. Person educated: Patient Education method: Medical Illustrator, handout. Education comprehension: verbalized understanding  HOME  EXERCISE PROGRAM: HOME EXERCISE PROGRAM [[id number]]  Scapular Retraction -  Repeat 15 Repetitions, Hold 2 Seconds, Complete 2 Sets, Perform 2 Times a Day  ASSESSMENT:  CLINICAL IMPRESSION: Pt reports decreased pain afterlast Rx and is doing better. Rx focused on therex and posture exs as well as US  combo, STW as well as IFC and did well.    OBJECTIVE IMPAIRMENTS: decreased activity tolerance, decreased ROM, increased muscle spasms, postural dysfunction, and pain.   ACTIVITY LIMITATIONS: carrying, lifting, and sleeping  PARTICIPATION LIMITATIONS: meal prep, cleaning, and laundry  PERSONAL FACTORS: Time since onset of injury/illness/exacerbation and 1 comorbidity: ACDF are also affecting patient's functional outcome.   REHAB POTENTIAL: Good  CLINICAL DECISION MAKING: Stable/uncomplicated  EVALUATION COMPLEXITY: Low   GOALS:  SHORT TERM GOALS: Target date: 12/23/23  Ind with an initial HEP.  Goal status: MET   LONG TERM GOALS: Target date: 01/20/24  Ind with an advanced HEP.  Goal status: On going  2.  Improve bilateral active cervical rotation to 60 degrees so she can turn her head more easily while driving.   Goal status: On going  3.  Perform ADL's with pain not > 3/10. Goal status: On going  4.  Sleep 6 hours undisturbed.  Goal status: On going  5.  Improve NDI by at least 5 points.  Goal status: On going  PLAN:  PT FREQUENCY: 2x/week  PT DURATION: 6 weeks  PLANNED INTERVENTIONS: 97110-Therapeutic exercises, 97530- Therapeutic activity, [id number]- Neuromuscular  re-education, 97535- Self Care, [id number]- Manual therapy, [id number]- Electrical stimulation (unattended), 97035- Ultrasound, Patient/Family education, Cryotherapy, and Moist heat  PLAN FOR NEXT SESSION: Postural exercises, modalities and STW/M as needed.     Ashana Tullo,CHRIS, PTA 01/02/2024, 2:18 PM

## 2024-01-06 ENCOUNTER — Ambulatory Visit: Payer: Self-pay | Admitting: Endocrinology

## 2024-01-06 ENCOUNTER — Encounter: Payer: Self-pay | Admitting: Endocrinology

## 2024-01-06 ENCOUNTER — Ambulatory Visit: Admitting: Endocrinology

## 2024-01-06 ENCOUNTER — Other Ambulatory Visit

## 2024-01-06 VITALS — BP 132/60 | HR 88 | Resp 16 | Ht 64.0 in | Wt 234.8 lb

## 2024-01-06 DIAGNOSIS — E890 Postprocedural hypothyroidism: Secondary | ICD-10-CM

## 2024-01-06 DIAGNOSIS — Z7984 Long term (current) use of oral hypoglycemic drugs: Secondary | ICD-10-CM | POA: Diagnosis not present

## 2024-01-06 DIAGNOSIS — N189 Chronic kidney disease, unspecified: Secondary | ICD-10-CM | POA: Diagnosis not present

## 2024-01-06 DIAGNOSIS — E1122 Type 2 diabetes mellitus with diabetic chronic kidney disease: Secondary | ICD-10-CM | POA: Diagnosis not present

## 2024-01-06 DIAGNOSIS — E119 Type 2 diabetes mellitus without complications: Secondary | ICD-10-CM

## 2024-01-06 LAB — POCT GLYCOSYLATED HEMOGLOBIN (HGB A1C): Hemoglobin A1C: 6.2 % — AB (ref 4.0–5.6)

## 2024-01-06 NOTE — Progress Notes (Signed)
 Outpatient Endocrinology Note Dawn Williard, MD  01/08/2024  Patient's Name: Dawn Salas    DOB: [birth year]    MRN: [id number]                                                    REASON OF VISIT: Follow up for type 2 diabetes mellitus / hypothyroidism  PCP: Micheal Wolm ORN, MD  HISTORY OF PRESENT ILLNESS:   Dawn Salas is a 83 y.o. 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   Pertinent Diabetes History: Patient has longstanding history of type 2 diabetes mellitus with mild obesity.  She has controlled type 2 diabetes mellitus.  Chronic Diabetes Complications : Retinopathy: no. Last ophthalmology exam was done on annually, reportedly.  Nephropathy: CKD, on losartan  Peripheral neuropathy: no Coronary artery disease: no Stroke: no  Relevant comorbidities and cardiovascular risk factors: Obesity: yes Body mass index is 40.3 kg/m.  Hypertension: yes Hyperlipidemia. Yes, on statin.   Current / Home Diabetic regimen includes:  Zituvimet   (  saxagliptin/metformin )  XR 50/500 mg 1 tablet daily.  Prior diabetic medications: Metformin  alone in the past.  Janumet  switched to Zituvimet  for insurance formulary.  Glycemic data:   She forgot to bring glucometer in the clinic today.  Hypoglycemia: Patient has no hypoglycemic episodes. Patient has hypoglycemia awareness.  She feels hypoglycemic symptoms when blood sugar is below 90.  # Postablative hypothyroidism -Patient had multinodular goiter evaluated with fine-needle aspiration in September 2012.  She was subsequently diagnosed with having hyperthyroidism in 2015 when she had palpitations and had a goiter.  At that time she was having symptoms of fatigue and shakiness and palpitation, heat intolerance and and some weight loss.  She was treated with I-131 unknown dose radioactive iodine  ablation in 2015 however subsequently her  hyperthyroidism reoccurred and methimazole  was restarted in ?  2017.  Due to persistent significant hyperthyroidism and frequently elevated thyrotropin receptor antibodies she was treated with RAI I-131 with 20.3 mCi on Jun 04, 2016.  Patient had Graves' disease status post RAI ablation x 2 , now postablative hypothyroidism.  Patient is currently on thyroid  hormone replacement /levothyroxine  and dose had been adjusted multiple times in the past.  In July 2022.  Patient had TSH 10, levothyroxine  dose was increased from 75 to 100 mcg daily.   Taking levothyroxine  100 mcg daily.  Graves' ophthalmopathy:  History of diplopia treated by ophthalmologist with wearing prism  lenses. Has some tearing of the eyes and has been given drops by the ophthalmologist. She is followed by ophthalmologist regularly Previous MRI had shown mild thickening of the rectus muscle on the left in 2018.  Interval history  No glucose data to review.  Patient reports he has rare blood sugar in upper 60s around noon time she has longer eating.  Discussed about not skipping meals for now.  Hemoglobin A1c 6.2%.  Diabetes regimen as reviewed and noted above.  She has been taking levothyroxine  100 mg daily.  Denies palpitation.  She has no other complaints today.  REVIEW OF SYSTEMS As per history of present illness.   PAST MEDICAL HISTORY: Past Medical History:  Diagnosis Date   Allergy    Anxiety    Arthritis    Asthma    patient  denies    Cataract    bilateral lens implants    Colon polyps    Depression    Diabetes mellitus    Type two   Diverticulitis    Double vision 2019   left eye   Family history of adverse reaction to anesthesia    daughter- N/V    Family history of ovarian cancer    Family history of pancreatic cancer    Frequency of urination    GERD (gastroesophageal reflux disease)    History of adverse reaction to anesthesia 03/19/2022   After regional block for TKA pt states she had uncontrollable  frequent blinking and zoned out was unable to speak more than one or two words for about 10 minutes   History of hiatal hernia    Hyperglycemia    Hyperlipidemia    Hypertension    Hyperthyroidism    PT HAD BIPOSY -NEGATIVE.SABRA AND NOW JUST GETS CHECKED EACH YEAR .SABRA 2013 LAST TEST   SEES DR. [name].    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 DOES NOT USE C PAP   Palpitations    Peptic ulcer disease    Pneumonia    walking pneumonia hx of   Seizures (HCC) 2015   one siezure with brain surgery none since 2015    Sleep apnea    no CPAP   Urinary tract infection 03/2020   Vertigo     PAST SURGICAL HISTORY: Past Surgical History:  Procedure Laterality Date   ABDOMINAL HYSTERECTOMY     ANTERIOR CERVICAL DECOMP/DISCECTOMY FUSION N/A 04/19/2021   Procedure: Anterior Cervical Discectomy and Fusion with Interbody Prosthesis, Plates/Screws Cervical Three-Four/Cervical Four-Five REMoval CERVICAL PLATE;  Surgeon: Mavis Purchase, MD;  Location: [facility] OR;  Service: Neurosurgery;  Laterality: N/A;  3C   APPLICATION OF CRANIAL NAVIGATION Left 01/09/2023   Procedure: APPLICATION OF CRANIAL NAVIGATION;  Surgeon: Mavis Purchase, MD;  Location: [facility] OR;  Service: Neurosurgery;  Laterality: Left;   BACK SURGERY     L SPIN 2003   NECK 2004   BRAIN MENINGIOMA EXCISION  08/2008   X 2   C5-C6 neck fusion     CATARACT EXTRACTION Bilateral    COLONOSCOPY  05/05/2009   pt states multiple polyps removed in both colonoscopies she has had   CRANIOTOMY  08/13/2011   Procedure: CRANIOTOMY TUMOR EXCISION;  Surgeon: Purchase JONETTA Mavis, MD;  Location: [facility];  Service: Neurosurgery;  Laterality: Bilateral;  Bifrontal craniotomy for tumor   CRANIOTOMY Left 01/09/2023   Procedure: FRONTAL CRANIOTOMY;  Surgeon: Mavis Purchase, MD;  Location[facility] OR;  Service: Neurosurgery;  Laterality: Left;   DILATION AND CURETTAGE OF UTERUS     YEARS AGO   L4-L5  posterior la  2021   L3-L5 fusion   left foot plantar and hammertoe     right foot bunectomy     right knee arthroscopy     x 3   right shoulder rotator cuff     ROBOTIC ASSISTED BILATERAL SALPINGO OOPHERECTOMY N/A 08/25/2015   Procedure: XI ROBOTIC ASSISTED BILATERAL SALPINGO OOPHORECTOMY;  Surgeon: Sari Bachelor, MD;  Location: [facility] ORS;  Service: Gynecology;  Laterality: N/A;   TONSILLECTOMY     TOTAL KNEE ARTHROPLASTY Right 03/19/2022   Procedure: TOTAL KNEE ARTHROPLASTY;  Surgeon: Melodi Lerner, MD;  Location: [facility] ORS;  Service: Orthopedics;  Laterality: Right;   TUBAL LIGATION     WRIST SURGERY Right  ALLERGIES: Allergies  Allergen Reactions   Aspirin Effervescent Swelling    Alka-Seltzer gel caps- sore mouth, face swollen, turned hands white   Flagyl  [Metronidazole ] Other (See Comments)    Caused increased heart rate   Bupivacaine  Other (See Comments)    Eye Batting really fast, Could not speak normally, Words were long   Morphine  Sulfate Hives    Hallucinations   Penicillins Swelling and Rash    Previously tolerated cephalexin , cefazolin  03/19/22    FAMILY HISTORY:  Family History  Problem Relation Age of Onset   Ovarian cancer Mother    Heart disease Father 31   Pancreatic cancer Maternal Aunt    Diabetes Maternal Grandmother    Anesthesia problems Daughter        NAUSEA AND VOMITING POST OP   Arthritis Other    Hyperlipidemia Other    Hypertension Other    Diabetes Other    Breast cancer Neg Hx    Colon cancer Neg Hx    Colon polyps Neg Hx    Esophageal cancer Neg Hx    Stomach cancer Neg Hx    Rectal cancer Neg Hx     SOCIAL HISTORY: Social History   Socioeconomic History   Marital status: Married    Spouse name: Not on file   Number of children: 6   Years of education: Not on file   Highest education level: Not on file  Occupational History   Not on file  Tobacco Use   Smoking status: Never   Smokeless tobacco: Never  Vaping Use   Vaping  status: Never Used  Substance and Sexual Activity   Alcohol  use: No   Drug use: No   Sexual activity: Never  Other Topics Concern   Not on file  Social History Narrative   Not on file   Social Drivers of Health   Tobacco Use: Low Risk (01/06/2024)   Patient History    Smoking Tobacco Use: Never    Smokeless Tobacco Use: Never    Passive Exposure: Not on file  Financial Resource Strain: Low Risk (11/19/2022)   Overall Financial Resource Strain (CARDIA)    Difficulty of Paying Living Expenses: Not hard at all  Food Insecurity: No Food Insecurity (01/14/2023)   Hunger Vital Sign    Worried About Running Out of Food in the Last Year: Never true    Ran Out of Food in the Last Year: Never true  Transportation Needs: No Transportation Needs (01/14/2023)   PRAPARE - Administrator, Civil Service (Medical): No    Lack of Transportation (Non-Medical): No  Physical Activity: Insufficiently Active (11/19/2022)   Exercise Vital Sign    Days of Exercise per Week: 7 days    Minutes of Exercise per Session: 20 min  Stress: No Stress Concern Present (11/19/2022)   [employer] of Occupational Health - Occupational Stress Questionnaire    Feeling of Stress : Not at all  Social Connections: Socially Integrated (11/19/2022)   Social Connection and Isolation Panel    Frequency of Communication with Friends and Family: More than three times a week    Frequency of Social Gatherings with Friends and Family: More than three times a week    Attends Religious Services: More than 4 times per year    Active Member of Clubs or Organizations: Yes    Attends Banker Meetings: More than 4 times per year    Marital Status: Married  Depression (PHQ2-9): Low Risk (11/19/2022)  Depression (PHQ2-9)    PHQ-2 Score: 0  Alcohol  Screen: Low Risk (11/19/2022)   Alcohol  Screen    Last Alcohol  Screening Score (AUDIT): 0  Housing: Low Risk (01/14/2023)   Housing Stability Vital Sign     Unable to Pay for Housing in the Last Year: No    Number of Times Moved in the Last Year: 0    Homeless in the Last Year: No  Utilities: Not At Risk (01/14/2023)   AHC Utilities    Threatened with loss of utilities: No  Health Literacy: Adequate Health Literacy (11/19/2022)   B1300 Health Literacy    Frequency of need for help with medical instructions: Never    MEDICATIONS:  Current Outpatient Medications  Medication Sig Dispense Refill   ACCU-CHEK AVIVA PLUS test strip USE TO TEST BLOOD SUGAR TWICE DAILY 50 strip 2   Accu-Chek Softclix Lancets lancets TEST BLOOD SUGAR TWICE DAILY. 100 each 0   acetaminophen  (TYLENOL ) 650 MG CR tablet Take 1,300 mg by mouth every morning.     amLODipine  (NORVASC ) 5 MG tablet TAKE ONE (1) TABLET EACH DAY 90 tablet 2   ascorbic acid (VITAMIN C) 500 MG tablet Take 500 mg by mouth daily.     cephALEXin  (KEFLEX ) 500 MG capsule Take 1 capsule (500 mg total) by mouth 3 (three) times daily. 21 capsule 0   cetirizine (ZYRTEC) 10 MG tablet Take 10 mg by mouth daily as needed for allergies.      Flaxseed, Linseed, (FLAX SEED OIL) 1000 MG CAPS Take 1,000 mg by mouth daily.     Iron-Vitamins (GERITOL COMPLETE PO) Take 1 tablet by mouth daily.     Lancet Devices (ACCU-CHEK SOFTCLIX) lancets 1 each by Other route 2 (two) times daily. E11.9 100 each 3   levETIRAcetam  (KEPPRA ) 500 MG tablet Take 1 tablet (500 mg total) by mouth 2 (two) times daily. 60 tablet 0   levothyroxine  (SYNTHROID ) 100 MCG tablet Take 1 tablet (100 mcg total) by mouth daily. 90 tablet 3   losartan -hydrochlorothiazide  (HYZAAR) 100-12.5 MG tablet TAKE ONE (1) TABLET EACH DAY 90 tablet 0   Magnesium  250 MG TABS Take 250 mg by mouth daily.     meclizine  (ANTIVERT ) 25 MG tablet Take 25 mg by mouth 3 (three) times daily as needed for dizziness.      metoprolol  succinate (TOPROL -XL) 50 MG 24 hr tablet TAKE ONE (1) TABLET BY MOUTH EVERY DAY 90 tablet 1   potassium chloride  (KLOR-CON ) 10 MEQ tablet  TAKE ONE (1) TABLET EACH DAY 90 tablet 0   Propylene Glycol (SYSTANE BALANCE) 0.6 % SOLN Place 1 drop into both eyes daily as needed (dry eyes).     RABEprazole  (ACIPHEX ) 20 MG tablet TAKE ONE (1) TABLET EACH DAY 90 tablet 1   rosuvastatin  (CRESTOR ) 40 MG tablet TAKE ONE (1) TABLET EACH DAY 90 tablet 3   Sitaglipt Base-Metform HCl ER (ZITUVIMET  XR) 50-500 MG TB24 Take 1 tablet by mouth daily. 90 tablet 3   solifenacin  (VESICARE ) 5 MG tablet TAKE ONE (1) TABLET BY MOUTH EVERY DAY 90 tablet 0   valACYclovir  (VALTREX ) 1000 MG tablet Take two tablets at onset of cold sore and repeat two tablets in 12 hours. 30 tablet 1   No current facility-administered medications for this visit.    PHYSICAL EXAM: Vitals:   01/06/24 1355  BP: 132/60  Pulse: 88  Resp: 16  SpO2: 98%  Weight: 234 lb 12.8 oz (106.5 kg)  Height: 5' 4 (1.626 m)  Body mass index is 40.3 kg/m.  Wt Readings from Last 3 Encounters:  01/06/24 234 lb 12.8 oz (106.5 kg)  11/18/23 234 lb 12.8 oz (106.5 kg)  11/13/23 234 lb 6.4 oz (106.3 kg)    General: Well developed, well nourished female in no apparent distress.  HEENT: AT/[place], no external lesions.  Eyes: Conjunctiva clear and no icterus. Neck: Neck supple  Lungs: Respirations not labored Neurologic: Alert, oriented, normal speech Extremities / Skin: Dry.  Psychiatric: Does not appear depressed or anxious  Diabetic Foot Exam - Simple   No data filed    LABS Reviewed Lab Results  Component Value Date   HGBA1C 6.2 (A) 01/06/2024   HGBA1C 7.1 (H) 08/13/2023   HGBA1C 6.9 (H) 04/25/2023   No results found for: FRUCTOSAMINE Lab Results  Component Value Date   CHOL 171 02/25/2023   HDL 87.90 02/25/2023   LDLCALC 69 02/25/2023   LDLDIRECT 195.4 04/15/2012   TRIG 72.0 02/25/2023   CHOLHDL 2 02/25/2023   Lab Results  Component Value Date   MICRALBCREAT 6 04/25/2023   Lab Results  Component Value Date   CREATININE 1.41 (H) 01/06/2024   Lab Results   Component Value Date   GFR 44.01 (L) 08/13/2023    ASSESSMENT / PLAN  1. Controlled diabetes mellitus type 2 with complications (HCC)   2. Hypothyroidism, postablative     Diabetes Mellitus type 2, complicated by CKD. - Diabetic status / severity: controlled.  Lab Results  Component Value Date   HGBA1C 6.2 (A) 01/06/2024    - Hemoglobin A1c goal : <7%  - Medications: No change  I) continue Zituvimet   (  saxagliptin/metformin )  XR 50/500 1 tablet daily.  - Home glucose testing: Daily in the morning fasting. - Discussed/ Gave Hypoglycemia treatment plan.  Advised not to skip meals.  Asked to call our clinic if she develops any hypoglycemia.  # Consult : not required at this time.   # Annual urine for microalbuminuria/ creatinine ratio, no microalbuminuria currently, continue ACE/ARB /losartan .  Patient has CKD.  Consider referral to nephrology. Last  Lab Results  Component Value Date   MICRALBCREAT 6 04/25/2023    # Foot check nightly.  # Annual dilated diabetic eye exams.   - Diet: Make healthy diabetic food choices - Life style / activity / exercise: Discussed.  2. Blood pressure  -  BP Readings from Last 1 Encounters:  01/06/24 132/60    - Control is in target.  - No change in current plans.  3. Lipid status / Hyperlipidemia - Last  Lab Results  Component Value Date   LDLCALC 69 02/25/2023   - Continue rosuvastatin  40 mg daily.  Managed by primary care provider.  # Postablative hypothyroidism -Continue current dose of levothyroxine  100 mcg daily.  - Will check thyroid  function test today.  Diagnoses and all orders for this visit:  Controlled diabetes mellitus type 2 with complications (HCC) -     POCT glycosylated hemoglobin (Hb A1C) -     Basic metabolic panel with GFR  Hypothyroidism, postablative -     T4, free -     TSH     DISPOSITION Follow up in clinic in 4 months suggested.  Labs today as ordered.  All questions answered and  patient verbalized understanding of the plan.  Viaan Knippenberg, MD [facility] [address] [place], [address] [place], [place] [address] Phone # [phone]  At least part of this note was  generated using voice recognition software. Inadvertent word errors may have occurred, which were not recognized during the proofreading process.

## 2024-01-07 ENCOUNTER — Ambulatory Visit: Admitting: Endocrinology

## 2024-01-07 LAB — BASIC METABOLIC PANEL WITH GFR
BUN/Creatinine Ratio: 11 (calc) (ref 6–22)
BUN: 15 mg/dL (ref 7–25)
CO2: 26 mmol/L (ref 20–32)
Calcium: 8.9 mg/dL (ref 8.6–10.4)
Chloride: 99 mmol/L (ref 98–110)
Creat: 1.41 mg/dL — ABNORMAL HIGH (ref 0.60–0.95)
Glucose, Bld: 123 mg/dL — ABNORMAL HIGH (ref 65–99)
Potassium: 4.1 mmol/L (ref 3.5–5.3)
Sodium: 136 mmol/L (ref 135–146)
eGFR: 37 mL/min/1.73m2 — ABNORMAL LOW (ref >=60)

## 2024-01-07 LAB — T4, FREE: Free T4: 1.4 ng/dL (ref 0.8–1.8)

## 2024-01-07 LAB — TSH: TSH: 3.08 m[IU]/L (ref 0.40–4.50)

## 2024-01-08 MED ORDER — LEVOTHYROXINE SODIUM 100 MCG PO TABS: 100.0000 ug | ORAL_TABLET | Freq: Every day | ORAL | 3 refills | Status: AC

## 2024-01-08 NOTE — Addendum Note (Signed)
 Addended by: Harish Bram on: 01/08/2024 01:34 PM   Modules accepted: Orders

## 2024-01-10 ENCOUNTER — Ambulatory Visit

## 2024-01-10 DIAGNOSIS — M542 Cervicalgia: Secondary | ICD-10-CM | POA: Diagnosis not present

## 2024-01-10 DIAGNOSIS — R293 Abnormal posture: Secondary | ICD-10-CM

## 2024-01-10 DIAGNOSIS — M62838 Other muscle spasm: Secondary | ICD-10-CM

## 2024-01-10 NOTE — Therapy (Signed)
 " OUTPATIENT PHYSICAL THERAPY CERVICAL TREATMENT  Patient Name: Dawn Salas MRN: [id number] DOB:April 14, 1940, 83 y.o., female Today's Date: 01/10/2024  END OF SESSION:  PT End of Session - 01/10/24 1019     Visit Number 8    Number of Visits 12    Date for Recertification  01/20/24    PT Start Time 1018    PT Stop Time 1114    PT Time Calculation (min) 56 min             Past Medical History:  Diagnosis Date   Allergy    Anxiety    Arthritis    Asthma    patient denies    Cataract    bilateral lens implants    Colon polyps    Depression    Diabetes mellitus    Type two   Diverticulitis    Double vision 2019   left eye   Family history of adverse reaction to anesthesia    daughter- N/V    Family history of ovarian cancer    Family history of pancreatic cancer    Frequency of urination    GERD (gastroesophageal reflux disease)    History of adverse reaction to anesthesia 03/19/2022   After regional block for TKA pt states she had uncontrollable frequent blinking and zoned out was unable to speak more than one or two words for about 10 minutes   History of hiatal hernia    Hyperglycemia    Hyperlipidemia    Hypertension    Hyperthyroidism    PT HAD BIPOSY -NEGATIVE.SABRA AND NOW JUST GETS CHECKED EACH YEAR .SABRA 2013 LAST TEST   SEES DR. [name].    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 DOES NOT USE C PAP   Palpitations    Peptic ulcer disease    Pneumonia    walking pneumonia hx of   Seizures (HCC) 2015   one siezure with brain surgery none since 2015    Sleep apnea    no CPAP   Urinary tract infection 03/2020   Vertigo    Past Surgical History:  Procedure Laterality Date   ABDOMINAL HYSTERECTOMY     ANTERIOR CERVICAL DECOMP/DISCECTOMY FUSION N/A 04/19/2021   Procedure: Anterior Cervical Discectomy and Fusion with Interbody Prosthesis, Plates/Screws Cervical Three-Four/Cervical Four-Five REMoval  CERVICAL PLATE;  Surgeon: Mavis Purchase, MD;  Location: [facility] OR;  Service: Neurosurgery;  Laterality: N/A;  3C   APPLICATION OF CRANIAL NAVIGATION Left 01/09/2023   Procedure: APPLICATION OF CRANIAL NAVIGATION;  Surgeon: Mavis Purchase, MD;  Location: [facility] OR;  Service: Neurosurgery;  Laterality: Left;   BACK SURGERY     L SPIN 2003   NECK 2004   BRAIN MENINGIOMA EXCISION  08/2008   X 2   C5-C6 neck fusion     CATARACT EXTRACTION Bilateral    COLONOSCOPY  05/05/2009   pt states multiple polyps removed in both colonoscopies she has had   CRANIOTOMY  08/13/2011   Procedure: CRANIOTOMY TUMOR EXCISION;  Surgeon: Purchase JONETTA Mavis, MD;  Location: [facility];  Service: Neurosurgery;  Laterality: Bilateral;  Bifrontal craniotomy for tumor   CRANIOTOMY Left 01/09/2023   Procedure: FRONTAL CRANIOTOMY;  Surgeon: Mavis Purchase, MD;  Location: [facility];  Service: Neurosurgery;  Laterality: Left;   DILATION AND CURETTAGE OF UTERUS     YEARS AGO   L4-L5 posterior la  2021   L3-L5 fusion  left foot plantar and hammertoe     right foot bunectomy     right knee arthroscopy     x 3   right shoulder rotator cuff     ROBOTIC ASSISTED BILATERAL SALPINGO OOPHERECTOMY N/A 08/25/2015   Procedure: XI ROBOTIC ASSISTED BILATERAL SALPINGO OOPHORECTOMY;  Surgeon: Sari Bachelor, MD;  Location: [facility] ORS;  Service: Gynecology;  Laterality: N/A;   TONSILLECTOMY     TOTAL KNEE ARTHROPLASTY Right 03/19/2022   Procedure: TOTAL KNEE ARTHROPLASTY;  Surgeon: Melodi Lerner, MD;  Location: [facility] ORS;  Service: Orthopedics;  Laterality: Right;   TUBAL LIGATION     WRIST SURGERY Right    Patient Active Problem List   Diagnosis Date Noted   S/P craniotomy 01/09/2023   Brain tumor (HCC) 01/09/2023   OA (osteoarthritis) of knee 03/19/2022   Cervical spondylosis with myelopathy and radiculopathy 04/19/2021   Genetic testing 08/01/2020   Family history of ovarian cancer 07/14/2020   Colon polyps 07/14/2020   Family  history of pancreatic cancer 07/14/2020   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   CKD (chronic kidney disease) stage 3, GFR 30-59 ml/min (HCC) 10/01/2017   History of normocytic normochromic anemia 11/25/2015   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   Spondylolisthesis of lumbar region 12/23/2013   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   Hyperthyroidism 05/25/2013   Obesity (BMI 30-39.9) 12/30/2012   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HCC) 08/21/2011   Meningioma (HCC) 08/13/2011   DIZZINESS 11/02/2009   Type 2 diabetes mellitus, controlled (HCC) 05/20/2009   HYPOKALEMIA 05/02/2009   DEPRESSION 06/24/2008   GERD 06/24/2008   PEPTIC ULCER DISEASE 06/24/2008   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  THERAPY DIAG:  Cervicalgia  Abnormal posture  Other muscle spasm  Rationale for Evaluation and Treatment: Rehabilitation  ONSET DATE: Ongoing, surgery (04/20/23.    SUBJECTIVE:                                                                                                                                                                                                         SUBJECTIVE STATEMENT: Pt states her neck is feeling some better since beginning therapy, but has continued soreness.   PERTINENT HISTORY:  Please see above.    PAIN:  Are you  having pain? Yes: NPRS scale: 5/10.  Pain location: Left cervical   Pain description: Sharp, numb.   Aggravating factors: As above.   Relieving factors: As above.    PRECAUTIONS: None  RED FLAGS: None     WEIGHT BEARING RESTRICTIONS: No  FALLS:  Has patient fallen in  last 6 months? No  LIVING ENVIRONMENT: Lives in: House/apartment Has following equipment at home: None  OCCUPATION: Retired.    PLOF: Independent  PATIENT GOALS: Not have pain.     OBJECTIVE:   PATIENT SURVEYS:  NDI:  23/50 (46%).    POSTURE: rounded shoulders and forward head  PALPATION: Tender to palpation over left SCM, left cervical paraspinal musculature and UT with trigger point noted.     CERVICAL ROM:  Active right cervical rotation is 50 degrees and left is 30 degrees.    UPPER EXTREMITY ROM: WNL.    UPPER EXTREMITY MMT: WNL.    DTR's:  UE DTR's 1+ to 2+/4+   TREATMENT DATE:     Neck  01/10/24: UBE 5 min fwd, 5 min bwd at 90 RPMs  reviewed HEP STM & TPR cervical paraspinals, UTs, levator, splenius capitus/cervicus  Modalities  Date:  Unattended Estim: Cervical, IFC 80-150 Hz, 15 mins, Pain and Tone Combo: Cervical, 100%; 1.5 w/cm2, 12 mins, Pain and Tone Hot Pack: Cervical, 15 mins, Pain and Tone   01/02/24: UBE 5 min fwd, 5 min bwd at 90 RPMs  reviewed HEP STM & TPR cervical paraspinals, UTs, levator, splenius capitus/cervicus US  combo 1.5 w/cm2 x 7 mins LT [place] Trigger Point Dry Needling  Subsequent Treatment: Instructions provided previously at initial dry needling treatment.   Patient Verbal Consent Given: Yes Education Handout Provided: Previously Provided Muscles Treated: Upper left splenius Capitus/Cervicus and cervical multidus. Electrical Stimulation Performed: No Treatment Response/Outcome: decreased muscle tension, twitch response  Premod to pt tolerance L lateral neck/shoulder x 20 minutes   12/25/23: Trigger Point Dry Needling  Initial Treatment: Pt instructed on Dry Needling rational, procedures, and possible side effects. Pt instructed to expect mild to moderate muscle soreness later in the day and/or into the next day.  Pt instructed in methods to reduce muscle soreness. Pt instructed to continue prescribed  HEP. Patient verbalized understanding of these instructions and education.   Patient Verbal Consent Given: Yes Education Handout Provided: Yes Muscles Treated: Upper left splenius Capitus/Cervicus and cervical multidus.    Trigger Point Dry Needling  What is Trigger Point Dry Needling (DN)? DN is a physical therapy technique used to treat muscle pain and dysfunction. Specifically, DN helps deactivate muscle trigger points (muscle knots).  A thin filiform needle is used to penetrate the skin and stimulate the underlying trigger point. The goal is for a local twitch response (LTR) to occur and for the trigger point to relax. No medication of any kind is injected during the procedure.  Benefits of Trigger Point Dry Needling Reduces localized tension and stiffness promoting muscle function. Promotes range of motion and flexibility of the area being treated. Relieves localized and referred muscle related pain.  Promotes localized blood flow. Benefits of DN increase when performed with formal therapy including strengthening and stretching.   What Does Trigger Point Dry Needling Feel Like?  The procedure feels different for each individual patient. Some patients report that they do not actually feel the needle enter the skin and overall the process is not painful. Very mild bleeding may occur. However, many patients feel a deep cramping in the muscle in  which the needle was inserted. This is the local twitch response.   How Will I feel after the treatment? Soreness is normal, and the onset of soreness may not occur for a few hours. Typically this soreness does not last longer than two days.  Bruising is uncommon, however; ice can be used to decrease any possible bruising.  In rare cases feeling tired or nauseous after the treatment is normal. In addition, your symptoms may get worse before they get better, this period will typically not last longer than 24 hours.   What Can I do After My  Treatment? Increase your hydration by drinking more water  for the next 24 hours.  You may place ice or heat on the areas treated that have become sore, however, do not use heat on inflamed or bruised areas. Heat often brings more relief post needling. You can continue your regular activities, but vigorous activity is not recommended initially after the treatment for 24 hours. DN is best combined with other physical therapy such as strengthening, stretching, and other therapies.   What are the complications? While your therapist has had extensive training in minimizing the risks of trigger point dry needling, it is important to understand the risks of any procedure.  Risks include bleeding, pain, fatigue, hematoma, infection, vertigo, nausea or nerve involvement. Monitor for any changes to your skin or sensation. Contact your therapist or MD with concerns.  A rare but serious complication is a pneumothorax over or near your middle and upper chest and back If you have dry needling in this area, monitor for the following symptoms: Shortness of breath on exertion and/or Difficulty taking a deep breath and/or Chest Pain and/or A dry cough If any of the above symptoms develop, please go to the nearest emergency room or call 911. Tell them you had dry needling over your thorax and report any symptoms you are having. Please follow-up with your treating therapist after you complete the medical evaluation.                                       PATIENT EDUCATION:  Education details: Chin tucks, cervical extension. Person educated: Patient Education method: Medical Illustrator, handout. Education comprehension: verbalized understanding  HOME EXERCISE PROGRAM: HOME EXERCISE PROGRAM [[id number]]  Scapular Retraction -  Repeat 15 Repetitions, Hold 2 Seconds, Complete 2 Sets, Perform 2 Times a Day  ASSESSMENT:  CLINICAL IMPRESSION: Pt arrives for today's treatment session reporting 5/10 right  neck pain.  Normal responses to all modalities performed today.  STW/M performed to left upper trap and cervical paraspinals to decrease pain and tone.  Pt reported decrease pain at completion of today's treatment session.  OBJECTIVE IMPAIRMENTS: decreased activity tolerance, decreased ROM, increased muscle spasms, postural dysfunction, and pain.   ACTIVITY LIMITATIONS: carrying, lifting, and sleeping  PARTICIPATION LIMITATIONS: meal prep, cleaning, and laundry  PERSONAL FACTORS: Time since onset of injury/illness/exacerbation and 1 comorbidity: ACDF are also affecting patient's functional outcome.   REHAB POTENTIAL: Good  CLINICAL DECISION MAKING: Stable/uncomplicated  EVALUATION COMPLEXITY: Low   GOALS:  SHORT TERM GOALS: Target date: 12/23/23  Ind with an initial HEP.  Goal status: MET   LONG TERM GOALS: Target date: 01/20/24  Ind with an advanced HEP.  Goal status: On going  2.  Improve bilateral active cervical rotation to 60 degrees so she can turn her head more easily while driving.  Goal status: On going  3.  Perform ADL's with pain not > 3/10. Goal status: On going  4.  Sleep 6 hours undisturbed.  Goal status: On going  5.  Improve NDI by at least 5 points.  Goal status: On going  PLAN:  PT FREQUENCY: 2x/week  PT DURATION: 6 weeks  PLANNED INTERVENTIONS: 97110-Therapeutic exercises, 97530- Therapeutic activity, [id number]- Neuromuscular re-education, 97535- Self Care, [id number]- Manual therapy, [id number]- Electrical stimulation (unattended), 97035- Ultrasound, Patient/Family education, Cryotherapy, and Moist heat  PLAN FOR NEXT SESSION: Postural exercises, modalities and STW/M as needed.     Delon DELENA Gosling, PTA 01/10/2024, 11:14 AM      "

## 2024-01-14 ENCOUNTER — Ambulatory Visit: Admitting: Physical Therapy

## 2024-01-14 DIAGNOSIS — R293 Abnormal posture: Secondary | ICD-10-CM

## 2024-01-14 DIAGNOSIS — M62838 Other muscle spasm: Secondary | ICD-10-CM

## 2024-01-14 DIAGNOSIS — M542 Cervicalgia: Secondary | ICD-10-CM

## 2024-01-14 NOTE — Therapy (Signed)
 " OUTPATIENT PHYSICAL THERAPY CERVICAL TREATMENT  Patient Name: Dawn Salas MRN: [id number] DOB:December 19, 1940, 83 y.o., female Today's Date: 01/14/2024  END OF SESSION:  PT End of Session - 01/14/24 1338     Visit Number 9    Number of Visits 12    Date for Recertification  01/20/24    PT Start Time 0100    PT Stop Time 0158    PT Time Calculation (min) 58 min    Activity Tolerance Patient tolerated treatment well    Behavior During Therapy [facility] for tasks assessed/performed             Past Medical History:  Diagnosis Date   Allergy    Anxiety    Arthritis    Asthma    patient denies    Cataract    bilateral lens implants    Colon polyps    Depression    Diabetes mellitus    Type two   Diverticulitis    Double vision 2019   left eye   Family history of adverse reaction to anesthesia    daughter- N/V    Family history of ovarian cancer    Family history of pancreatic cancer    Frequency of urination    GERD (gastroesophageal reflux disease)    History of adverse reaction to anesthesia 03/19/2022   After regional block for TKA pt states she had uncontrollable frequent blinking and zoned out was unable to speak more than one or two words for about 10 minutes   History of hiatal hernia    Hyperglycemia    Hyperlipidemia    Hypertension    Hyperthyroidism    PT HAD BIPOSY -NEGATIVE.SABRA AND NOW JUST GETS CHECKED EACH YEAR .SABRA 2013 LAST TEST   SEES DR. [name].    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 DOES NOT USE C PAP   Palpitations    Peptic ulcer disease    Pneumonia    walking pneumonia hx of   Seizures (HCC) 2015   one siezure with brain surgery none since 2015    Sleep apnea    no CPAP   Urinary tract infection 03/2020   Vertigo    Past Surgical History:  Procedure Laterality Date   ABDOMINAL HYSTERECTOMY     ANTERIOR CERVICAL DECOMP/DISCECTOMY FUSION N/A 04/19/2021   Procedure: Anterior  Cervical Discectomy and Fusion with Interbody Prosthesis, Plates/Screws Cervical Three-Four/Cervical Four-Five REMoval CERVICAL PLATE;  Surgeon: Mavis Purchase, MD;  Location: [facility] OR;  Service: Neurosurgery;  Laterality: N/A;  3C   APPLICATION OF CRANIAL NAVIGATION Left 01/09/2023   Procedure: APPLICATION OF CRANIAL NAVIGATION;  Surgeon: Mavis Purchase, MD;  Location[facility] OR;  Service: Neurosurgery;  Laterality: Left;   BACK SURGERY     L SPIN 2003   NECK 2004   BRAIN MENINGIOMA EXCISION  08/2008   X 2   C5-C6 neck fusion     CATARACT EXTRACTION Bilateral    COLONOSCOPY  05/05/2009   pt states multiple polyps removed in both colonoscopies she has had   CRANIOTOMY  08/13/2011   Procedure: CRANIOTOMY TUMOR EXCISION;  Surgeon: Purchase JONETTA Mavis, MD;  Location: [facility];  Service: Neurosurgery;  Laterality: Bilateral;  Bifrontal craniotomy for tumor   CRANIOTOMY Left 01/09/2023   Procedure: FRONTAL CRANIOTOMY;  Surgeon: Mavis Purchase, MD;  Location: [facility] OR;  Service: Neurosurgery;  Laterality: Left;   DILATION AND CURETTAGE OF UTERUS  YEARS AGO   L4-L5 posterior la  2021   L3-L5 fusion   left foot plantar and hammertoe     right foot bunectomy     right knee arthroscopy     x 3   right shoulder rotator cuff     ROBOTIC ASSISTED BILATERAL SALPINGO OOPHERECTOMY N/A 08/25/2015   Procedure: XI ROBOTIC ASSISTED BILATERAL SALPINGO OOPHORECTOMY;  Surgeon: Sari Bachelor, MD;  Location: [facility] ORS;  Service: Gynecology;  Laterality: N/A;   TONSILLECTOMY     TOTAL KNEE ARTHROPLASTY Right 03/19/2022   Procedure: TOTAL KNEE ARTHROPLASTY;  Surgeon: Melodi Lerner, MD;  Location: [facility] ORS;  Service: Orthopedics;  Laterality: Right;   TUBAL LIGATION     WRIST SURGERY Right    Patient Active Problem List   Diagnosis Date Noted   S/P craniotomy 01/09/2023   Brain tumor (HCC) 01/09/2023   OA (osteoarthritis) of knee 03/19/2022   Cervical spondylosis with myelopathy and radiculopathy 04/19/2021    Genetic testing 08/01/2020   Family history of ovarian cancer 07/14/2020   Colon polyps 07/14/2020   Family history of pancreatic cancer 07/14/2020   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   CKD (chronic kidney disease) stage 3, GFR 30-59 ml/min (HCC) 10/01/2017   History of normocytic normochromic anemia 11/25/2015   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   Spondylolisthesis of lumbar region 12/23/2013   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   Hyperthyroidism 05/25/2013   Obesity (BMI 30-39.9) 12/30/2012   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HCC) 08/21/2011   Meningioma (HCC) 08/13/2011   DIZZINESS 11/02/2009   Type 2 diabetes mellitus, controlled (HCC) 05/20/2009   HYPOKALEMIA 05/02/2009   DEPRESSION 06/24/2008   GERD 06/24/2008   PEPTIC ULCER DISEASE 06/24/2008   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  THERAPY DIAG:  Cervicalgia  Abnormal posture  Other muscle spasm  Rationale for Evaluation and Treatment: Rehabilitation  ONSET DATE: Ongoing, surgery (04/20/23.    SUBJECTIVE:                                                                                                                                                                                                         SUBJECTIVE STATEMENT: Pain around a 4.    PERTINENT HISTORY:  Please see above.  PAIN:  Are you having pain? Yes: NPRS scale: 4/10.  Pain location: Left cervical   Pain description: Sharp, numb.   Aggravating factors: As above.   Relieving factors: As above.    PRECAUTIONS: None  RED FLAGS: None     WEIGHT BEARING RESTRICTIONS:  No  FALLS:  Has patient fallen in last 6 months? No  LIVING ENVIRONMENT: Lives in: House/apartment Has following equipment at home: None  OCCUPATION: Retired.    PLOF: Independent  PATIENT GOALS: Not have pain.     OBJECTIVE:   PATIENT SURVEYS:  NDI:  23/50 (46%).    POSTURE: rounded shoulders and forward head  PALPATION: Tender to palpation over left SCM, left cervical paraspinal musculature and UT with trigger point noted.     CERVICAL ROM:  Active right cervical rotation is 50 degrees and left is 30 degrees.    UPPER EXTREMITY ROM: WNL.    UPPER EXTREMITY MMT: WNL.    DTR's:  UE DTR's 1+ to 2+/4+   TREATMENT DATE:       01/14/24:  DN to:  Upper left splenius Capitus/Cervicus and cervical multidus f/b STW/M x 23 minutes f/b HMP and IFC at 80-150 Hz on 40% scan x 20 minutes.    Neck  01/10/24: UBE 5 min fwd, 5 min bwd at 90 RPMs  reviewed HEP STM & TPR cervical paraspinals, UTs, levator, splenius capitus/cervicus  Modalities  Date:  Unattended Estim: Cervical, IFC 80-150 Hz, 15 mins, Pain and Tone Combo: Cervical, 100%; 1.5 w/cm2, 12 mins, Pain and Tone Hot Pack: Cervical, 15 mins, Pain and Tone   01/02/24: UBE 5 min fwd, 5 min bwd at 90 RPMs  reviewed HEP STM & TPR cervical paraspinals, UTs, levator, splenius capitus/cervicus US  combo 1.5 w/cm2 x 7 mins LT [place] Trigger Point Dry Needling  Subsequent Treatment: Instructions provided previously at initial dry needling treatment.   Patient Verbal Consent Given: Yes Education Handout Provided: Previously Provided Muscles Treated: Upper left splenius Capitus/Cervicus and cervical multidus. Electrical Stimulation Performed: No Treatment Response/Outcome: decreased muscle tension, twitch response  Premod to pt tolerance L lateral neck/shoulder x 20 minutes   12/25/23: Trigger Point Dry Needling  Initial Treatment: Pt instructed on Dry Needling rational, procedures, and possible side  effects. Pt instructed to expect mild to moderate muscle soreness later in the day and/or into the next day.  Pt instructed in methods to reduce muscle soreness. Pt instructed to continue prescribed HEP. Patient verbalized understanding of these instructions and education.   Patient Verbal Consent Given: Yes Education Handout Provided: Yes Muscles Treated: Upper left splenius Capitus/Cervicus and cervical multidus.    Trigger Point Dry Needling  What is Trigger Point Dry Needling (DN)? DN is a physical therapy technique used to treat muscle pain and dysfunction. Specifically, DN helps deactivate muscle trigger points (muscle knots).  A thin filiform needle is used to penetrate the skin and stimulate the underlying trigger point. The goal is for a local twitch response (LTR) to occur and for the trigger point to relax. No medication of any kind is injected during the procedure.  Benefits of Trigger Point Dry Needling Reduces localized tension and stiffness promoting muscle function. Promotes range of motion and flexibility of the area being treated. Relieves localized and referred muscle related pain.  Promotes localized blood flow. Benefits of DN increase when performed with formal therapy including strengthening and stretching.   What Does Trigger Point Dry Needling Feel Like?  The procedure feels different for each  individual patient. Some patients report that they do not actually feel the needle enter the skin and overall the process is not painful. Very mild bleeding may occur. However, many patients feel a deep cramping in the muscle in which the needle was inserted. This is the local twitch response.   How Will I feel after the treatment? Soreness is normal, and the onset of soreness may not occur for a few hours. Typically this soreness does not last longer than two days.  Bruising is uncommon, however; ice can be used to decrease any possible bruising.  In rare cases feeling  tired or nauseous after the treatment is normal. In addition, your symptoms may get worse before they get better, this period will typically not last longer than 24 hours.   What Can I do After My Treatment? Increase your hydration by drinking more water  for the next 24 hours.  You may place ice or heat on the areas treated that have become sore, however, do not use heat on inflamed or bruised areas. Heat often brings more relief post needling. You can continue your regular activities, but vigorous activity is not recommended initially after the treatment for 24 hours. DN is best combined with other physical therapy such as strengthening, stretching, and other therapies.   What are the complications? While your therapist has had extensive training in minimizing the risks of trigger point dry needling, it is important to understand the risks of any procedure.  Risks include bleeding, pain, fatigue, hematoma, infection, vertigo, nausea or nerve involvement. Monitor for any changes to your skin or sensation. Contact your therapist or MD with concerns.  A rare but serious complication is a pneumothorax over or near your middle and upper chest and back If you have dry needling in this area, monitor for the following symptoms: Shortness of breath on exertion and/or Difficulty taking a deep breath and/or Chest Pain and/or A dry cough If any of the above symptoms develop, please go to the nearest emergency room or call 911. Tell them you had dry needling over your thorax and report any symptoms you are having. Please follow-up with your treating therapist after you complete the medical evaluation.                                       PATIENT EDUCATION:  Education details: Chin tucks, cervical extension. Person educated: Patient Education method: Medical Illustrator, handout. Education comprehension: verbalized understanding  HOME EXERCISE PROGRAM: HOME EXERCISE PROGRAM  [[id number]]  Scapular Retraction -  Repeat 15 Repetitions, Hold 2 Seconds, Complete 2 Sets, Perform 2 Times a Day  ASSESSMENT:  CLINICAL IMPRESSION: Patient is pleased with her progress.  She did great with dry needling today and reported a significant reduction in pain following.   OBJECTIVE IMPAIRMENTS: decreased activity tolerance, decreased ROM, increased muscle spasms, postural dysfunction, and pain.   ACTIVITY LIMITATIONS: carrying, lifting, and sleeping  PARTICIPATION LIMITATIONS: meal prep, cleaning, and laundry  PERSONAL FACTORS: Time since onset of injury/illness/exacerbation and 1 comorbidity: ACDF are also affecting patient's functional outcome.   REHAB POTENTIAL: Good  CLINICAL DECISION MAKING: Stable/uncomplicated  EVALUATION COMPLEXITY: Low   GOALS:  SHORT TERM GOALS: Target date: 12/23/23  Ind with an initial HEP.  Goal status: MET   LONG TERM GOALS: Target date: 01/20/24  Ind with an advanced HEP.  Goal status: On going  2.  Improve bilateral active  cervical rotation to 60 degrees so she can turn her head more easily while driving.   Goal status: On going  3.  Perform ADL's with pain not > 3/10. Goal status: On going  4.  Sleep 6 hours undisturbed.  Goal status: On going  5.  Improve NDI by at least 5 points.  Goal status: On going  PLAN:  PT FREQUENCY: 2x/week  PT DURATION: 6 weeks  PLANNED INTERVENTIONS: 97110-Therapeutic exercises, 97530- Therapeutic activity, [id number]- Neuromuscular re-education, 97535- Self Care, [id number]- Manual therapy, [id number]- Electrical stimulation (unattended), 97035- Ultrasound, Patient/Family education, Cryotherapy, and Moist heat  PLAN FOR NEXT SESSION: Postural exercises, modalities and STW/M as needed.     Jabril Pursell, PT 01/14/2024, 2:01 PM      "

## 2024-01-16 ENCOUNTER — Other Ambulatory Visit: Payer: Self-pay | Admitting: Family Medicine

## 2024-01-21 ENCOUNTER — Ambulatory Visit: Admitting: Physical Therapy

## 2024-01-22 ENCOUNTER — Ambulatory Visit: Admitting: Physical Therapy

## 2024-01-22 DIAGNOSIS — R293 Abnormal posture: Secondary | ICD-10-CM

## 2024-01-22 DIAGNOSIS — M62838 Other muscle spasm: Secondary | ICD-10-CM

## 2024-01-22 DIAGNOSIS — M542 Cervicalgia: Secondary | ICD-10-CM | POA: Diagnosis not present

## 2024-01-22 NOTE — Therapy (Addendum)
 " OUTPATIENT PHYSICAL THERAPY CERVICAL TREATMENT  Patient Name: Dawn Salas MRN: [id number] DOB:17-May-1940, 83 y.o., female Today's Date: 01/22/2024  END OF SESSION:  PT End of Session - 01/22/24 0910     Visit Number 10    Number of Visits 12    Date for Recertification  01/20/24    PT Start Time 0800    PT Stop Time 0900    PT Time Calculation (min) 60 min    Activity Tolerance Patient tolerated treatment well    Behavior During Therapy [facility] for tasks assessed/performed              Past Medical History:  Diagnosis Date   Allergy    Anxiety    Arthritis    Asthma    patient denies    Cataract    bilateral lens implants    Colon polyps    Depression    Diabetes mellitus    Type two   Diverticulitis    Double vision 2019   left eye   Family history of adverse reaction to anesthesia    daughter- N/V    Family history of ovarian cancer    Family history of pancreatic cancer    Frequency of urination    GERD (gastroesophageal reflux disease)    History of adverse reaction to anesthesia 03/19/2022   After regional block for TKA pt states she had uncontrollable frequent blinking and zoned out was unable to speak more than one or two words for about 10 minutes   History of hiatal hernia    Hyperglycemia    Hyperlipidemia    Hypertension    Hyperthyroidism    PT HAD BIPOSY -NEGATIVE.SABRA AND NOW JUST GETS CHECKED EACH YEAR .SABRA 2013 LAST TEST   SEES DR. [name].    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 DOES NOT USE C PAP   Palpitations    Peptic ulcer disease    Pneumonia    walking pneumonia hx of   Seizures (HCC) 2015   one siezure with brain surgery none since 2015    Sleep apnea    no CPAP   Urinary tract infection 03/2020   Vertigo    Past Surgical History:  Procedure Laterality Date   ABDOMINAL HYSTERECTOMY     ANTERIOR CERVICAL DECOMP/DISCECTOMY FUSION N/A 04/19/2021   Procedure: Anterior  Cervical Discectomy and Fusion with Interbody Prosthesis, Plates/Screws Cervical Three-Four/Cervical Four-Five REMoval CERVICAL PLATE;  Surgeon: Mavis Purchase, MD;  Location: [facility] OR;  Service: Neurosurgery;  Laterality: N/A;  3C   APPLICATION OF CRANIAL NAVIGATION Left 01/09/2023   Procedure: APPLICATION OF CRANIAL NAVIGATION;  Surgeon: Mavis Purchase, MD;  Location: [facility] OR;  Service: Neurosurgery;  Laterality: Left;   BACK SURGERY     L SPIN 2003   NECK 2004   BRAIN MENINGIOMA EXCISION  08/2008   X 2   C5-C6 neck fusion     CATARACT EXTRACTION Bilateral    COLONOSCOPY  05/05/2009   pt states multiple polyps removed in both colonoscopies she has had   CRANIOTOMY  08/13/2011   Procedure: CRANIOTOMY TUMOR EXCISION;  Surgeon: Purchase JONETTA Mavis, MD;  Location: [facility] OR[facility];  Service: Neurosurgery;  Laterality: Bilateral;  Bifrontal craniotomy for tumor   CRANIOTOMY Left 01/09/2023   Procedure: FRONTAL CRANIOTOMY;  Surgeon: Mavis Purchase, MD;  Location: [facility] OR;  Service: Neurosurgery;  Laterality: Left;   DILATION AND CURETTAGE OF  UTERUS     YEARS AGO   L4-L5 posterior la  2021   L3-L5 fusion   left foot plantar and hammertoe     right foot bunectomy     right knee arthroscopy     x 3   right shoulder rotator cuff     ROBOTIC ASSISTED BILATERAL SALPINGO OOPHERECTOMY N/A 08/25/2015   Procedure: XI ROBOTIC ASSISTED BILATERAL SALPINGO OOPHORECTOMY;  Surgeon: Sari Bachelor, MD;  Location: [facility] ORS;  Service: Gynecology;  Laterality: N/A;   TONSILLECTOMY     TOTAL KNEE ARTHROPLASTY Right 03/19/2022   Procedure: TOTAL KNEE ARTHROPLASTY;  Surgeon: Melodi Lerner, MD;  Location: [facility] ORS;  Service: Orthopedics;  Laterality: Right;   TUBAL LIGATION     WRIST SURGERY Right    Patient Active Problem List   Diagnosis Date Noted   S/P craniotomy 01/09/2023   Brain tumor (HCC) 01/09/2023   OA (osteoarthritis) of knee 03/19/2022   Cervical spondylosis with myelopathy and radiculopathy 04/19/2021    Genetic testing 08/01/2020   Family history of ovarian cancer 07/14/2020   Colon polyps 07/14/2020   Family history of pancreatic cancer 07/14/2020   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   CKD (chronic kidney disease) stage 3, GFR 30-59 ml/min (HCC) 10/01/2017   History of normocytic normochromic anemia 11/25/2015   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   Spondylolisthesis of lumbar region 12/23/2013   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   Hyperthyroidism 05/25/2013   Obesity (BMI 30-39.9) 12/30/2012   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HCC) 08/21/2011   Meningioma (HCC) 08/13/2011   DIZZINESS 11/02/2009   Type 2 diabetes mellitus, controlled (HCC) 05/20/2009   HYPOKALEMIA 05/02/2009   DEPRESSION 06/24/2008   GERD 06/24/2008   PEPTIC ULCER DISEASE 06/24/2008   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  THERAPY DIAG:  Cervicalgia  Abnormal posture  Other muscle spasm  Rationale for Evaluation and Treatment: Rehabilitation  ONSET DATE: Ongoing, surgery (04/20/23.    SUBJECTIVE:                                                                                                                                                                                                         SUBJECTIVE STATEMENT: Whole lot better.  PERTINENT HISTORY:  Please see above.  PAIN:  Are you having pain? Yes: NPRS scale: /10.  Pain location: Left cervical   Pain description: Sharp, numb.   Aggravating factors: As above.   Relieving factors: As above.    PRECAUTIONS: None  RED FLAGS: None     WEIGHT BEARING RESTRICTIONS:  No  FALLS:  Has patient fallen in last 6 months? No  LIVING ENVIRONMENT: Lives in: House/apartment Has following equipment at home: None  OCCUPATION: Retired.    PLOF: Independent  PATIENT GOALS: Not have pain.     OBJECTIVE:   PATIENT SURVEYS:  NDI:  23/50 (46%).    POSTURE: rounded shoulders and forward head  PALPATION: Tender to palpation over left SCM, left cervical paraspinal musculature and UT with trigger point noted.     CERVICAL ROM:  Active right cervical rotation is 50 degrees and left is 30 degrees.    UPPER EXTREMITY ROM: WNL.    UPPER EXTREMITY MMT: WNL.    DTR's:  UE DTR's 1+ to 2+/4+   TREATMENT DATE:      01/22/24:  DN to:  Upper left splenius Capitus/Cervicus and cervical multidus f/b STW/M x 24 minutes f/b HMP and IFC at 80-150 Hz on 40% scan x 20 minutes.   Normal modality resposne following removal of modality.   01/14/24:  DN to:  Upper left splenius Capitus/Cervicus and cervical multidus f/b STW/M x 23 minutes f/b HMP and IFC at 80-150 Hz on 40% scan x 20 minutes.    Neck  01/10/24: UBE 5 min fwd, 5 min bwd at 90 RPMs  reviewed HEP STM & TPR cervical paraspinals, UTs, levator, splenius capitus/cervicus   Trigger Point Dry Needling  Subsequent Treatment: Instructions provided previously at initial dry needling treatment.   Patient Verbal Consent Given: Yes Education Handout Provided: Previously Provided Muscles Treated: Upper left splenius Capitus/Cervicus and cervical multidus. Electrical Stimulation Performed: No Treatment Response/Outcome: decreased muscle tension, twitch response  Premod to pt tolerance L lateral neck/shoulder x 20 minutes   12/25/23: Trigger Point Dry Needling  Initial Treatment: Pt instructed on Dry Needling rational, procedures, and possible side effects. Pt instructed to expect mild to moderate muscle soreness later in the day and/or into the next day.  Pt instructed in methods to reduce muscle  soreness. Pt instructed to continue prescribed HEP. Patient verbalized understanding of these instructions and education.   Patient Verbal Consent Given: Yes Education Handout Provided: Yes Muscles Treated: Upper left splenius Capitus/Cervicus and cervical multidus.    Trigger Point Dry Needling  What is Trigger Point Dry Needling (DN)? DN is a physical therapy technique used to treat muscle pain and dysfunction. Specifically, DN helps deactivate muscle trigger points (muscle knots).  A thin filiform needle is used to penetrate the skin and stimulate the underlying trigger point. The goal is for a local twitch response (LTR) to occur and for the trigger point to relax. No medication of any kind is injected during the procedure.  Benefits of Trigger Point Dry Needling Reduces localized tension and stiffness promoting muscle function. Promotes range of motion and flexibility of the area being treated. Relieves localized and referred muscle related pain.  Promotes localized blood flow. Benefits of DN increase when performed with formal therapy including strengthening and stretching.   What Does Trigger Point Dry Needling Feel Like?  The procedure feels different for each individual patient. Some patients report that they do not actually feel the needle enter the skin and overall the process is not painful. Very mild bleeding may  occur. However, many patients feel a deep cramping in the muscle in which the needle was inserted. This is the local twitch response.   How Will I feel after the treatment? Soreness is normal, and the onset of soreness may not occur for a few hours. Typically this soreness does not last longer than two days.  Bruising is uncommon, however; ice can be used to decrease any possible bruising.  In rare cases feeling tired or nauseous after the treatment is normal. In addition, your symptoms may get worse before they get better, this period will typically not last longer  than 24 hours.   What Can I do After My Treatment? Increase your hydration by drinking more water  for the next 24 hours.  You may place ice or heat on the areas treated that have become sore, however, do not use heat on inflamed or bruised areas. Heat often brings more relief post needling. You can continue your regular activities, but vigorous activity is not recommended initially after the treatment for 24 hours. DN is best combined with other physical therapy such as strengthening, stretching, and other therapies.   What are the complications? While your therapist has had extensive training in minimizing the risks of trigger point dry needling, it is important to understand the risks of any procedure.  Risks include bleeding, pain, fatigue, hematoma, infection, vertigo, nausea or nerve involvement. Monitor for any changes to your skin or sensation. Contact your therapist or MD with concerns.  A rare but serious complication is a pneumothorax over or near your middle and upper chest and back If you have dry needling in this area, monitor for the following symptoms: Shortness of breath on exertion and/or Difficulty taking a deep breath and/or Chest Pain and/or A dry cough If any of the above symptoms develop, please go to the nearest emergency room or call 911. Tell them you had dry needling over your thorax and report any symptoms you are having. Please follow-up with your treating therapist after you complete the medical evaluation.                                       PATIENT EDUCATION:  Education details: Chin tucks, cervical extension. Person educated: Patient Education method: Medical Illustrator, handout. Education comprehension: verbalized understanding  HOME EXERCISE PROGRAM: HOME EXERCISE PROGRAM [[id number]]  Scapular Retraction -  Repeat 15 Repetitions, Hold 2 Seconds, Complete 2 Sets, Perform 2 Times a Day  ASSESSMENT:  CLINICAL IMPRESSION: See  below.  OBJECTIVE IMPAIRMENTS: decreased activity tolerance, decreased ROM, increased muscle spasms, postural dysfunction, and pain.   ACTIVITY LIMITATIONS: carrying, lifting, and sleeping  PARTICIPATION LIMITATIONS: meal prep, cleaning, and laundry  PERSONAL FACTORS: Time since onset of injury/illness/exacerbation and 1 comorbidity: ACDF are also affecting patient's functional outcome.   REHAB POTENTIAL: Good  CLINICAL DECISION MAKING: Stable/uncomplicated  EVALUATION COMPLEXITY: Low   GOALS:  SHORT TERM GOALS: Target date: 12/23/23  Ind with an initial HEP.  Goal status: MET   LONG TERM GOALS: Target date: 01/20/24  Ind with an advanced HEP.  Goal status: On going  2.  Improve bilateral active cervical rotation to 60 degrees so she can turn her head more easily while driving.   Goal status: RT 55 degrees, left is 50 degrees (12/31).    3.  Perform ADL's with pain not > 3/10. Goal status: On going  4.  Sleep 6  hours undisturbed.  Goal status: MET.    5.  Improve NDI by at least 5 points.  Goal status: On going  PLAN:  PT FREQUENCY: 2x/week  PT DURATION: 6 weeks  PLANNED INTERVENTIONS: 97110-Therapeutic exercises, 97530- Therapeutic activity, [id number]- Neuromuscular re-education, 97535- Self Care, [id number]- Manual therapy, [id number]- Electrical stimulation (unattended), 97035- Ultrasound, Patient/Family education, Cryotherapy, and Moist heat  PLAN FOR NEXT SESSION: Postural exercises, modalities and STW/M as needed.    Progress Note Reporting Period 12/09/23 to 01/22/24.  See note below for Objective Data and Assessment of Progress/Goals.  The patient is making very good progress toward goals. She has found dry needling very beneficial.  She is sleeping better and her active cervical rotation has improved.      Delio Slates, PT 01/22/2024, 9:18 AM      "

## 2024-01-27 ENCOUNTER — Ambulatory Visit: Attending: Student | Admitting: Physical Therapy

## 2024-01-27 DIAGNOSIS — M62838 Other muscle spasm: Secondary | ICD-10-CM | POA: Insufficient documentation

## 2024-01-27 DIAGNOSIS — R293 Abnormal posture: Secondary | ICD-10-CM | POA: Diagnosis present

## 2024-01-27 DIAGNOSIS — M542 Cervicalgia: Secondary | ICD-10-CM | POA: Diagnosis present

## 2024-01-27 NOTE — Therapy (Signed)
 " OUTPATIENT PHYSICAL THERAPY CERVICAL TREATMENT  Patient Name: Dawn Salas MRN: [id number] DOB:1940-09-20, 84 y.o., female Today's Date: 01/27/2024  END OF SESSION:  PT End of Session - 01/27/24 1635     Visit Number 11    Number of Visits 12    Date for Recertification  01/20/24    PT Start Time 0315    PT Stop Time 0413    PT Time Calculation (min) 58 min    Activity Tolerance Patient tolerated treatment well    Behavior During Therapy [facility] for tasks assessed/performed              Past Medical History:  Diagnosis Date   Allergy    Anxiety    Arthritis    Asthma    patient denies    Cataract    bilateral lens implants    Colon polyps    Depression    Diabetes mellitus    Type two   Diverticulitis    Double vision 2019   left eye   Family history of adverse reaction to anesthesia    daughter- N/V    Family history of ovarian cancer    Family history of pancreatic cancer    Frequency of urination    GERD (gastroesophageal reflux disease)    History of adverse reaction to anesthesia 03/19/2022   After regional block for TKA pt states she had uncontrollable frequent blinking and zoned out was unable to speak more than one or two words for about 10 minutes   History of hiatal hernia    Hyperglycemia    Hyperlipidemia    Hypertension    Hyperthyroidism    PT HAD BIPOSY -NEGATIVE.SABRA AND NOW JUST GETS CHECKED EACH YEAR .SABRA 2013 LAST TEST   SEES DR. [name].    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 DOES NOT USE C PAP   Palpitations    Peptic ulcer disease    Pneumonia    walking pneumonia hx of   Seizures (HCC) 2015   one siezure with brain surgery none since 2015    Sleep apnea    no CPAP   Urinary tract infection 03/2020   Vertigo    Past Surgical History:  Procedure Laterality Date   ABDOMINAL HYSTERECTOMY     ANTERIOR CERVICAL DECOMP/DISCECTOMY FUSION N/A 04/19/2021   Procedure: Anterior  Cervical Discectomy and Fusion with Interbody Prosthesis, Plates/Screws Cervical Three-Four/Cervical Four-Five REMoval CERVICAL PLATE;  Surgeon: Mavis Purchase, MD;  Location: [facility] OR;  Service: Neurosurgery;  Laterality: N/A;  3C   APPLICATION OF CRANIAL NAVIGATION Left 01/09/2023   Procedure: APPLICATION OF CRANIAL NAVIGATION;  Surgeon: Mavis Purchase, MD;  Location: [facility] OR;  Service: Neurosurgery;  Laterality: Left;   BACK SURGERY     L SPIN 2003   NECK 2004   BRAIN MENINGIOMA EXCISION  08/2008   X 2   C5-C6 neck fusion     CATARACT EXTRACTION Bilateral    COLONOSCOPY  05/05/2009   pt states multiple polyps removed in both colonoscopies she has had   CRANIOTOMY  08/13/2011   Procedure: CRANIOTOMY TUMOR EXCISION;  Surgeon: Purchase JONETTA Mavis, MD;  Location: [facility];  Service: Neurosurgery;  Laterality: Bilateral;  Bifrontal craniotomy for tumor   CRANIOTOMY Left 01/09/2023   Procedure: FRONTAL CRANIOTOMY;  Surgeon: Mavis Purchase, MD;  Location: [facility] OR;  Service: Neurosurgery;  Laterality: Left;   DILATION AND CURETTAGE OF  UTERUS     YEARS AGO   L4-L5 posterior la  2021   L3-L5 fusion   left foot plantar and hammertoe     right foot bunectomy     right knee arthroscopy     x 3   right shoulder rotator cuff     ROBOTIC ASSISTED BILATERAL SALPINGO OOPHERECTOMY N/A 08/25/2015   Procedure: XI ROBOTIC ASSISTED BILATERAL SALPINGO OOPHORECTOMY;  Surgeon: Sari Bachelor, MD;  Location: [facility] ORS;  Service: Gynecology;  Laterality: N/A;   TONSILLECTOMY     TOTAL KNEE ARTHROPLASTY Right 03/19/2022   Procedure: TOTAL KNEE ARTHROPLASTY;  Surgeon: Melodi Lerner, MD;  Location: [facility] ORS;  Service: Orthopedics;  Laterality: Right;   TUBAL LIGATION     WRIST SURGERY Right    Patient Active Problem List   Diagnosis Date Noted   S/P craniotomy 01/09/2023   Brain tumor (HCC) 01/09/2023   OA (osteoarthritis) of knee 03/19/2022   Cervical spondylosis with myelopathy and radiculopathy 04/19/2021    Genetic testing 08/01/2020   Family history of ovarian cancer 07/14/2020   Colon polyps 07/14/2020   Family history of pancreatic cancer 07/14/2020   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   CKD (chronic kidney disease) stage 3, GFR 30-59 ml/min (HCC) 10/01/2017   History of normocytic normochromic anemia 11/25/2015   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   Spondylolisthesis of lumbar region 12/23/2013   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   Hyperthyroidism 05/25/2013   Obesity (BMI 30-39.9) 12/30/2012   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HCC) 08/21/2011   Meningioma (HCC) 08/13/2011   DIZZINESS 11/02/2009   Type 2 diabetes mellitus, controlled (HCC) 05/20/2009   HYPOKALEMIA 05/02/2009   DEPRESSION 06/24/2008   GERD 06/24/2008   PEPTIC ULCER DISEASE 06/24/2008   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  THERAPY DIAG:  Cervicalgia  Abnormal posture  Other muscle spasm  Rationale for Evaluation and Treatment: Rehabilitation  ONSET DATE: Ongoing, surgery (04/20/23.    SUBJECTIVE:                                                                                                                                                                                                         SUBJECTIVE STATEMENT: Pain about a 4 today.  Patient states she feels  at least 50% better.  PERTINENT HISTORY:  Please see above.    PAIN:  Are you having pain? Yes: NPRS scale: /10.  Pain location: Left cervical   Pain description: Sharp, numb.   Aggravating factors: As above.   Relieving factors: As above.    PRECAUTIONS: None  RED  FLAGS: None     WEIGHT BEARING RESTRICTIONS: No  FALLS:  Has patient fallen in last 6 months? No  LIVING ENVIRONMENT: Lives in: House/apartment Has following equipment at home: None  OCCUPATION: Retired.    PLOF: Independent  PATIENT GOALS: Not have pain.     OBJECTIVE:   PATIENT SURVEYS:  NDI:  23/50 (46%).    POSTURE: rounded shoulders and forward head  PALPATION: Tender to palpation over left SCM, left cervical paraspinal musculature and UT with trigger point noted.     CERVICAL ROM:  Active right cervical rotation is 50 degrees and left is 30 degrees.    UPPER EXTREMITY ROM: WNL.    UPPER EXTREMITY MMT: WNL.    DTR's:  UE DTR's 1+ to 2+/4+   TREATMENT DATE:      01/27/24:  DN to:  Upper left splenius Capitus/Cervicus and cervical multidus f/b STW/M x 24 minutes f/b HMP and IFC at 80-150 Hz on 40% scan x 20 minutes.   Normal modality resposne following removal of modality.   01/22/24:  DN to:  Upper left splenius Capitus/Cervicus and cervical multidus f/b STW/M x 24 minutes f/b HMP and IFC at 80-150 Hz on 40% scan x 20 minutes.   Normal modality resposne following removal of modality.    Trigger Point Dry Needling  Subsequent Treatment: Instructions provided previously at initial dry needling treatment.   Patient Verbal Consent Given: Yes Education Handout Provided: Previously Provided Muscles Treated: Upper left splenius Capitus/Cervicus and cervical multidus. Electrical Stimulation Performed: No Treatment Response/Outcome: decreased muscle tension, twitch response  Premod to pt tolerance L lateral neck/shoulder x 20 minutes   12/25/23: Trigger Point Dry Needling  Initial Treatment: Pt instructed on Dry Needling rational, procedures, and possible side effects. Pt instructed to expect mild to moderate muscle soreness later in the day and/or into the next day.  Pt instructed in methods to reduce muscle soreness. Pt instructed to continue prescribed  HEP. Patient verbalized understanding of these instructions and education.   Patient Verbal Consent Given: Yes Education Handout Provided: Yes Muscles Treated: Upper left splenius Capitus/Cervicus and cervical multidus.    Trigger Point Dry Needling  What is Trigger Point Dry Needling (DN)? DN is a physical therapy technique used to treat muscle pain and dysfunction. Specifically, DN helps deactivate muscle trigger points (muscle knots).  A thin filiform needle is used to penetrate the skin and stimulate the underlying trigger point. The goal is for a local twitch response (LTR) to occur and for the trigger point to relax. No medication of any kind is injected during the procedure.  Benefits of Trigger Point Dry Needling Reduces localized tension and stiffness promoting muscle function. Promotes range of motion and flexibility of the area being treated. Relieves localized and referred muscle related pain.  Promotes localized blood flow. Benefits of DN increase when performed with formal therapy including strengthening and stretching.   What Does Trigger Point Dry Needling Feel Like?  The procedure feels different for each individual patient. Some patients report that they do not actually feel the needle enter the skin and overall the process is not painful. Very mild bleeding may occur. However, many patients  feel a deep cramping in the muscle in which the needle was inserted. This is the local twitch response.   How Will I feel after the treatment? Soreness is normal, and the onset of soreness may not occur for a few hours. Typically this soreness does not last longer than two days.  Bruising is uncommon, however; ice can be used to decrease any possible bruising.  In rare cases feeling tired or nauseous after the treatment is normal. In addition, your symptoms may get worse before they get better, this period will typically not last longer than 24 hours.   What Can I do After My  Treatment? Increase your hydration by drinking more water  for the next 24 hours.  You may place ice or heat on the areas treated that have become sore, however, do not use heat on inflamed or bruised areas. Heat often brings more relief post needling. You can continue your regular activities, but vigorous activity is not recommended initially after the treatment for 24 hours. DN is best combined with other physical therapy such as strengthening, stretching, and other therapies.   What are the complications? While your therapist has had extensive training in minimizing the risks of trigger point dry needling, it is important to understand the risks of any procedure.  Risks include bleeding, pain, fatigue, hematoma, infection, vertigo, nausea or nerve involvement. Monitor for any changes to your skin or sensation. Contact your therapist or MD with concerns.  A rare but serious complication is a pneumothorax over or near your middle and upper chest and back If you have dry needling in this area, monitor for the following symptoms: Shortness of breath on exertion and/or Difficulty taking a deep breath and/or Chest Pain and/or A dry cough If any of the above symptoms develop, please go to the nearest emergency room or call 911. Tell them you had dry needling over your thorax and report any symptoms you are having. Please follow-up with your treating therapist after you complete the medical evaluation.                                       PATIENT EDUCATION:  Education details: Chin tucks, cervical extension. Person educated: Patient Education method: Medical Illustrator, handout. Education comprehension: verbalized understanding  HOME EXERCISE PROGRAM: HOME EXERCISE PROGRAM [[id number]]  Scapular Retraction -  Repeat 15 Repetitions, Hold 2 Seconds, Complete 2 Sets, Perform 2 Times a Day  ASSESSMENT:  CLINICAL IMPRESSION: Overall patient is reporting feeling at least 50% better  overall since beginning PT.  Pain was a 4 upon presentation to the clinic today and she felt significant pain reduction following treatment.  OBJECTIVE IMPAIRMENTS: decreased activity tolerance, decreased ROM, increased muscle spasms, postural dysfunction, and pain.   ACTIVITY LIMITATIONS: carrying, lifting, and sleeping  PARTICIPATION LIMITATIONS: meal prep, cleaning, and laundry  PERSONAL FACTORS: Time since onset of injury/illness/exacerbation and 1 comorbidity: ACDF are also affecting patient's functional outcome.   REHAB POTENTIAL: Good  CLINICAL DECISION MAKING: Stable/uncomplicated  EVALUATION COMPLEXITY: Low   GOALS:  SHORT TERM GOALS: Target date: 12/23/23  Ind with an initial HEP.  Goal status: MET   LONG TERM GOALS: Target date: 01/20/24  Ind with an advanced HEP.  Goal status: On going  2.  Improve bilateral active cervical rotation to 60 degrees so she can turn her head more easily while driving.   Goal status: RT 55 degrees,  left is 50 degrees (12/31).    3.  Perform ADL's with pain not > 3/10. Goal status: On going  4.  Sleep 6 hours undisturbed.  Goal status: MET.    5.  Improve NDI by at least 5 points.  Goal status: On going  PLAN:  PT FREQUENCY: 2x/week  PT DURATION: 6 weeks  PLANNED INTERVENTIONS: 97110-Therapeutic exercises, 97530- Therapeutic activity, [id number]- Neuromuscular re-education, 97535- Self Care, [id number]- Manual therapy, [id number]- Electrical stimulation (unattended), 97035- Ultrasound, Patient/Family education, Cryotherapy, and Moist heat  PLAN FOR NEXT SESSION: Postural exercises, modalities and STW/M as needed.       Perl Folmar, PT 01/27/2024, 4:37 PM      "

## 2024-01-31 ENCOUNTER — Ambulatory Visit: Admitting: Physical Therapy

## 2024-02-03 ENCOUNTER — Encounter: Payer: Self-pay | Admitting: Family Medicine

## 2024-02-03 ENCOUNTER — Ambulatory Visit: Admitting: Family Medicine

## 2024-02-03 VITALS — BP 160/74 | HR 90 | Temp 98.1°F | Wt 234.1 lb

## 2024-02-03 DIAGNOSIS — I10 Essential (primary) hypertension: Secondary | ICD-10-CM

## 2024-02-03 DIAGNOSIS — N1831 Chronic kidney disease, stage 3a: Secondary | ICD-10-CM | POA: Diagnosis not present

## 2024-02-03 DIAGNOSIS — R300 Dysuria: Secondary | ICD-10-CM

## 2024-02-03 DIAGNOSIS — R3915 Urgency of urination: Secondary | ICD-10-CM

## 2024-02-03 LAB — POC URINALSYSI DIPSTICK (AUTOMATED)
Blood, UA: POSITIVE
Glucose, UA: NEGATIVE
Ketones, UA: NEGATIVE
Nitrite, UA: NEGATIVE
Protein, UA: POSITIVE — AB
Spec Grav, UA: 1.015
Urobilinogen, UA: 0.2 U/dL
pH, UA: 6

## 2024-02-03 MED ORDER — CIPROFLOXACIN HCL 500 MG PO TABS: 500.0000 mg | ORAL_TABLET | Freq: Two times a day (BID) | ORAL | 0 refills | Status: DC

## 2024-02-03 NOTE — Patient Instructions (Signed)
 Monitor home blood pressure and be in touch if consistently > 140/90.

## 2024-02-03 NOTE — Progress Notes (Signed)
 "  Established Patient Office Visit  Subjective   Patient ID: Dawn Salas, female    DOB: [birth year]  Age: 84 y.o. MRN: [id number]  Chief Complaint  Patient presents with   Medical Management of Chronic Issues   Dysuria    HPI    Dawn Salas is seen for routine medical follow-up.  Her main concern is about 2 weeks now of some recurrent burning with urination intermittently along with occasional frequency and urgency.  She feels like her urine may be more cloudy than usual as well.  No fever.  No flank pain.  History of frequent UTIs in the past.  She has type 2 diabetes which is currently followed by endocrinologist.  Recent A1c 6.2%.  She had slight worsening of kidney function with creatinine 1.41 in December with baseline creatinine around 1.19.  She has history of urinary urgency.  Has seen urologist in the past.  Requesting for us  to take over prescription of her Vesicare  if possible.  Tolerating well without side effects.  She does have hypertension currently treated with amlodipine , losartan  HCTZ, metoprolol  extended release.  She has hyperlipidemia treated with rosuvastatin .  Last lipids were checked in February.  Past Medical History:  Diagnosis Date   Allergy    Anxiety    Arthritis    Asthma    patient denies    Cataract    bilateral lens implants    Colon polyps    Depression    Diabetes mellitus    Type two   Diverticulitis    Double vision 2019   left eye   Family history of adverse reaction to anesthesia    daughter- N/V    Family history of ovarian cancer    Family history of pancreatic cancer    Frequency of urination    GERD (gastroesophageal reflux disease)    History of adverse reaction to anesthesia 03/19/2022   After regional block for TKA pt states she had uncontrollable frequent blinking and zoned out was unable to speak more than one or two words for about 10 minutes   History of hiatal hernia    Hyperglycemia    Hyperlipidemia     Hypertension    Hyperthyroidism    PT HAD BIPOSY -NEGATIVE.SABRA AND NOW JUST GETS CHECKED EACH YEAR .SABRA 2013 LAST TEST   SEES DR. [name].    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 DOES NOT USE C PAP   Palpitations    Peptic ulcer disease    Pneumonia    walking pneumonia hx of   Seizures (HCC) 2015   one siezure with brain surgery none since 2015    Sleep apnea    no CPAP   Urinary tract infection 03/2020   Vertigo    Past Surgical History:  Procedure Laterality Date   ABDOMINAL HYSTERECTOMY     ANTERIOR CERVICAL DECOMP/DISCECTOMY FUSION N/A 04/19/2021   Procedure: Anterior Cervical Discectomy and Fusion with Interbody Prosthesis, Plates/Screws Cervical Three-Four/Cervical Four-Five REMoval CERVICAL PLATE;  Surgeon: Mavis Purchase, MD;  Location: [facility] OR;  Service: Neurosurgery;  Laterality: N/A;  3C   APPLICATION OF CRANIAL NAVIGATION Left 01/09/2023   Procedure: APPLICATION OF CRANIAL NAVIGATION;  Surgeon: Mavis Purchase, MD;  Location: [facility] OR;  Service: Neurosurgery;  Laterality: Left;   BACK SURGERY     L SPIN 2003   NECK 2004   BRAIN MENINGIOMA EXCISION  08/2008   X  2   C5-C6 neck fusion     CATARACT EXTRACTION Bilateral    COLONOSCOPY  05/05/2009   pt states multiple polyps removed in both colonoscopies she has had   CRANIOTOMY  08/13/2011   Procedure: CRANIOTOMY TUMOR EXCISION;  Surgeon: Reyes JONETTA Budge, MD;  Location: [facility];  Service: Neurosurgery;  Laterality: Bilateral;  Bifrontal craniotomy for tumor   CRANIOTOMY Left 01/09/2023   Procedure: FRONTAL CRANIOTOMY;  Surgeon: Budge Reyes, MD;  Location: [facility] OR;  Service: Neurosurgery;  Laterality: Left;   DILATION AND CURETTAGE OF UTERUS     YEARS AGO   L4-L5 posterior la  2021   L3-L5 fusion   left foot plantar and hammertoe     right foot bunectomy     right knee arthroscopy     x 3   right shoulder rotator cuff     ROBOTIC ASSISTED BILATERAL  SALPINGO OOPHERECTOMY N/A 08/25/2015   Procedure: XI ROBOTIC ASSISTED BILATERAL SALPINGO OOPHORECTOMY;  Surgeon: Sari Bachelor, MD;  Location: [facility] ORS;  Service: Gynecology;  Laterality: N/A;   TONSILLECTOMY     TOTAL KNEE ARTHROPLASTY Right 03/19/2022   Procedure: TOTAL KNEE ARTHROPLASTY;  Surgeon: Melodi Lerner, MD;  Location: [facility];  Service: Orthopedics;  Laterality: Right;   TUBAL LIGATION     WRIST SURGERY Right     reports that she has never smoked. She has never used smokeless tobacco. She reports that she does not drink alcohol  and does not use drugs. family history includes Anesthesia problems in her daughter; Arthritis in an other family member; Diabetes in her maternal grandmother and another family member; Heart disease (age of onset: 68) in her father; Hyperlipidemia in an other family member; Hypertension in an other family member; Ovarian cancer in her mother; Pancreatic cancer in her maternal aunt. Allergies[1]  Review of Systems  Constitutional:  Negative for chills and fever.  Respiratory:  Negative for cough.   Cardiovascular:  Negative for chest pain.  Gastrointestinal:  Negative for nausea and vomiting.  Genitourinary:  Positive for dysuria, frequency and urgency. Negative for flank pain and hematuria.      Objective:     BP (!) 160/74   Pulse 90   Temp 98.1 F (36.7 C) (Oral)   Wt 234 lb 1.6 oz (106.2 kg)   SpO2 98%   BMI 40.18 kg/m  BP Readings from Last 3 Encounters:  02/03/24 (!) 160/74  01/06/24 132/60  11/18/23 (!) 150/80   Wt Readings from Last 3 Encounters:  02/03/24 234 lb 1.6 oz (106.2 kg)  01/06/24 234 lb 12.8 oz (106.5 kg)  11/18/23 234 lb 12.8 oz (106.5 kg)      Physical Exam Vitals reviewed.  Constitutional:      General: She is not in acute distress.    Appearance: She is not ill-appearing or toxic-appearing.  Cardiovascular:     Rate and Rhythm: Normal rate and regular rhythm.  Pulmonary:     Effort: Pulmonary effort is  normal.     Breath sounds: Normal breath sounds. No wheezing or rales.  Neurological:     Mental Status: She is alert.      No results found for any visits on 02/03/24.  Last CBC Lab Results  Component Value Date   WBC 4.8 01/03/2023   HGB 12.1 01/03/2023   HCT 37.6 01/03/2023   MCV 80.9 01/03/2023   MCH 26.0 01/03/2023   RDW 14.3 01/03/2023   PLT 184 01/03/2023   Last metabolic panel Lab  Results  Component Value Date   GLUCOSE 123 (H) 01/06/2024   NA 136 01/06/2024   K 4.1 01/06/2024   CL 99 01/06/2024   CO2 26 01/06/2024   BUN 15 01/06/2024   CREATININE 1.41 (H) 01/06/2024   EGFR 37 (L) 01/06/2024   CALCIUM  8.9 01/06/2024   PROT 6.7 02/25/2023   ALBUMIN  4.2 02/25/2023   BILITOT 0.8 02/25/2023   ALKPHOS 90 02/25/2023   AST 21 02/25/2023   ALT 11 02/25/2023   ANIONGAP 10 01/03/2023   Last lipids Lab Results  Component Value Date   CHOL 171 02/25/2023   HDL 87.90 02/25/2023   LDLCALC 69 02/25/2023   LDLDIRECT 195.4 04/15/2012   TRIG 72.0 02/25/2023   CHOLHDL 2 02/25/2023   Last hemoglobin A1c Lab Results  Component Value Date   HGBA1C 6.2 (A) 01/06/2024      The ASCVD Risk score (Arnett DK, et al., 2019) failed to calculate for the following reasons:   The 2019 ASCVD risk score is only valid for ages 62 to 54   * - Cholesterol units were assumed    Assessment & Plan:   #1 dysuria.  Urine dipstick reveals cloudy urine with positive blood and 3+ leukocytes.  Suggestive of possible infection.  She had Klebsiella pneumonia UTI back in October which was resistant to cephalexin .  Stay well-hydrated.  Start Cipro  500 mg twice daily for 5 days.  Follow-up for any persistent or worsening symptoms  #2 history of chronic urine urgency.  She is treated with Vesicare  per urology.  She is requesting that we take over her Vesicare  prescription if possible.  #3 hypertension.  Blood pressure up slightly today.  Generally well-controlled. She takes amlodipine ,  Toprol -XL 50 mg daily, and losartan  HCTZ.  Recommend close home monitoring over the next several days and be in touch if consistent systolic readings over 140.  Try to keep daily sodium intake less than 2500 mg.  #4 chronic kidney disease stage III.  Recent slight worsening of kidney function.  Stressed importance of adequate hydration and avoidance of nonsteroidals.  She has scheduled follow-up labs with endocrinology in April  No follow-ups on file.    Wolm Scarlet, MD     [1]  Allergies Allergen Reactions   Aspirin Effervescent Swelling    Alka-Seltzer gel caps- sore mouth, face swollen, turned hands white   Flagyl  [Metronidazole ] Other (See Comments)    Caused increased heart rate   Bupivacaine  Other (See Comments)    Eye Batting really fast, Could not speak normally, Words were long   Morphine  Sulfate Hives    Hallucinations   Penicillins Swelling and Rash    Previously tolerated cephalexin , cefazolin  03/19/22   "

## 2024-02-04 ENCOUNTER — Ambulatory Visit: Admitting: Physical Therapy

## 2024-02-04 DIAGNOSIS — R293 Abnormal posture: Secondary | ICD-10-CM

## 2024-02-04 DIAGNOSIS — M62838 Other muscle spasm: Secondary | ICD-10-CM

## 2024-02-04 DIAGNOSIS — M542 Cervicalgia: Secondary | ICD-10-CM | POA: Diagnosis not present

## 2024-02-04 NOTE — Therapy (Signed)
 " OUTPATIENT PHYSICAL THERAPY CERVICAL TREATMENT  Patient Name: Dawn Salas MRN: [id number] DOB:1940-08-04, 84 y.o., female Today's Date: 02/04/2024  END OF SESSION:  PT End of Session - 02/04/24 1535     Visit Number 12    Number of Visits 12    Date for Recertification  01/20/24    PT Start Time 0230    PT Stop Time 0331    PT Time Calculation (min) 61 min    Activity Tolerance Patient tolerated treatment well    Behavior During Therapy [facility] for tasks assessed/performed               Past Medical History:  Diagnosis Date   Allergy    Anxiety    Arthritis    Asthma    patient denies    Cataract    bilateral lens implants    Colon polyps    Depression    Diabetes mellitus    Type two   Diverticulitis    Double vision 2019   left eye   Family history of adverse reaction to anesthesia    daughter- N/V    Family history of ovarian cancer    Family history of pancreatic cancer    Frequency of urination    GERD (gastroesophageal reflux disease)    History of adverse reaction to anesthesia 03/19/2022   After regional block for TKA pt states she had uncontrollable frequent blinking and zoned out was unable to speak more than one or two words for about 10 minutes   History of hiatal hernia    Hyperglycemia    Hyperlipidemia    Hypertension    Hyperthyroidism    PT HAD BIPOSY -NEGATIVE.SABRA AND NOW JUST GETS CHECKED EACH YEAR .SABRA 2013 LAST TEST   SEES DR. [name].    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 DOES NOT USE C PAP   Palpitations    Peptic ulcer disease    Pneumonia    walking pneumonia hx of   Seizures (HCC) 2015   one siezure with brain surgery none since 2015    Sleep apnea    no CPAP   Urinary tract infection 03/2020   Vertigo    Past Surgical History:  Procedure Laterality Date   ABDOMINAL HYSTERECTOMY     ANTERIOR CERVICAL DECOMP/DISCECTOMY FUSION N/A 04/19/2021   Procedure: Anterior  Cervical Discectomy and Fusion with Interbody Prosthesis, Plates/Screws Cervical Three-Four/Cervical Four-Five REMoval CERVICAL PLATE;  Surgeon: Mavis Purchase, MD;  Location: [facility] OR;  Service: Neurosurgery;  Laterality: N/A;  3C   APPLICATION OF CRANIAL NAVIGATION Left 01/09/2023   Procedure: APPLICATION OF CRANIAL NAVIGATION;  Surgeon: Mavis Purchase, MD;  Location: [facility] OR;  Service: Neurosurgery;  Laterality: Left;   BACK SURGERY     L SPIN 2003   NECK 2004   BRAIN MENINGIOMA EXCISION  08/2008   X 2   C5-C6 neck fusion     CATARACT EXTRACTION Bilateral    COLONOSCOPY  05/05/2009   pt states multiple polyps removed in both colonoscopies she has had   CRANIOTOMY  08/13/2011   Procedure: CRANIOTOMY TUMOR EXCISION;  Surgeon: Purchase JONETTA Mavis, MD;  Location: [facility];  Service: Neurosurgery;  Laterality: Bilateral;  Bifrontal craniotomy for tumor   CRANIOTOMY Left 01/09/2023   Procedure: FRONTAL CRANIOTOMY;  Surgeon: Mavis Purchase, MD;  Location: [facility] OR;  Service: Neurosurgery;  Laterality: Left;   DILATION AND CURETTAGE  OF UTERUS     YEARS AGO   L4-L5 posterior la  2021   L3-L5 fusion   left foot plantar and hammertoe     right foot bunectomy     right knee arthroscopy     x 3   right shoulder rotator cuff     ROBOTIC ASSISTED BILATERAL SALPINGO OOPHERECTOMY N/A 08/25/2015   Procedure: XI ROBOTIC ASSISTED BILATERAL SALPINGO OOPHORECTOMY;  Surgeon: Sari Bachelor, MD;  Location: [facility] ORS;  Service: Gynecology;  Laterality: N/A;   TONSILLECTOMY     TOTAL KNEE ARTHROPLASTY Right 03/19/2022   Procedure: TOTAL KNEE ARTHROPLASTY;  Surgeon: Melodi Lerner, MD;  Location: [facility];  Service: Orthopedics;  Laterality: Right;   TUBAL LIGATION     WRIST SURGERY Right    Patient Active Problem List   Diagnosis Date Noted   S/P craniotomy 01/09/2023   Brain tumor (HCC) 01/09/2023   OA (osteoarthritis) of knee 03/19/2022   Cervical spondylosis with myelopathy and radiculopathy 04/19/2021    Genetic testing 08/01/2020   Family history of ovarian cancer 07/14/2020   Colon polyps 07/14/2020   Family history of pancreatic cancer 07/14/2020   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   CKD (chronic kidney disease) stage 3, GFR 30-59 ml/min (HCC) 10/01/2017   History of normocytic normochromic anemia 11/25/2015   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   Spondylolisthesis of lumbar region 12/23/2013   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   Hyperthyroidism 05/25/2013   Obesity (BMI 30-39.9) 12/30/2012   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HCC) 08/21/2011   Meningioma (HCC) 08/13/2011   DIZZINESS 11/02/2009   Type 2 diabetes mellitus, controlled (HCC) 05/20/2009   HYPOKALEMIA 05/02/2009   DEPRESSION 06/24/2008   GERD 06/24/2008   PEPTIC ULCER DISEASE 06/24/2008   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  THERAPY DIAG:  Cervicalgia  Abnormal posture  Other muscle spasm  Rationale for Evaluation and Treatment: Rehabilitation  ONSET DATE: Ongoing, surgery (04/20/23.    SUBJECTIVE:                                                                                                                                                                                                         SUBJECTIVE STATEMENT: Pain at a 2 today.  Patient states as  of late her pain only really increases when she forget about correct posture (reading).    PERTINENT HISTORY:  Please see above.    PAIN:  Are you having pain? Yes: NPRS scale: /10.  Pain location: Left cervical   Pain description: Sharp, numb.   Aggravating factors: As above.    Relieving factors: As above.    PRECAUTIONS: None  RED FLAGS: None     WEIGHT BEARING RESTRICTIONS: No  FALLS:  Has patient fallen in last 6 months? No  LIVING ENVIRONMENT: Lives in: House/apartment Has following equipment at home: None  OCCUPATION: Retired.    PLOF: Independent  PATIENT GOALS: Not have pain.     OBJECTIVE:   PATIENT SURVEYS:  NDI:  23/50 (46%).    POSTURE: rounded shoulders and forward head  PALPATION: Tender to palpation over left SCM, left cervical paraspinal musculature and UT with trigger point noted.     CERVICAL ROM:  Active right cervical rotation is 50 degrees and left is 30 degrees.    UPPER EXTREMITY ROM: WNL.    UPPER EXTREMITY MMT: WNL.    DTR's:  UE DTR's 1+ to 2+/4+   TREATMENT DATE:      02/04/24:  DN to:  Upper left splenius Capitus/Cervicus and cervical multidus f/b STW/M x 24 minutes f/b HMP and IFC at 80-150 Hz on 40% scan x 20 minutes.   Normal modality resposne following removal of modality.  01/27/24:  DN to:  Upper left splenius Capitus/Cervicus and cervical multidus f/b STW/M x 24 minutes f/b HMP and IFC at 80-150 Hz on 40% scan x 20 minutes.   Normal modality resposne following removal of modality.   01/22/24:  DN to:  Upper left splenius Capitus/Cervicus and cervical multidus f/b STW/M x 24 minutes f/b HMP and IFC at 80-150 Hz on 40% scan x 20 minutes.   Normal modality resposne following removal of modality.    Trigger Point Dry Needling  Subsequent Treatment: Instructions provided previously at initial dry needling treatment.   Patient Verbal Consent Given: Yes Education Handout Provided: Previously Provided Muscles Treated: Upper left splenius Capitus/Cervicus and cervical multidus. Electrical Stimulation Performed: No Treatment Response/Outcome: decreased muscle tension, twitch response  Premod to pt tolerance L lateral neck/shoulder x 20 minutes   12/25/23: Trigger Point Dry Needling  Initial  Treatment: Pt instructed on Dry Needling rational, procedures, and possible side effects. Pt instructed to expect mild to moderate muscle soreness later in the day and/or into the next day.  Pt instructed in methods to reduce muscle soreness. Pt instructed to continue prescribed HEP. Patient verbalized understanding of these instructions and education.   Patient Verbal Consent Given: Yes Education Handout Provided: Yes Muscles Treated: Upper left splenius Capitus/Cervicus and cervical multidus.    Trigger Point Dry Needling  What is Trigger Point Dry Needling (DN)? DN is a physical therapy technique used to treat muscle pain and dysfunction. Specifically, DN helps deactivate muscle trigger points (muscle knots).  A thin filiform needle is used to penetrate the skin and stimulate the underlying trigger point. The goal is for a local twitch response (LTR) to occur and for the trigger point to relax. No medication of any kind is injected during the procedure.  Benefits of Trigger Point Dry Needling Reduces localized tension and stiffness promoting muscle function. Promotes range of motion and flexibility of the area being treated. Relieves localized and referred muscle related pain.  Promotes localized blood flow. Benefits of DN increase when performed with formal therapy  including strengthening and stretching.   What Does Trigger Point Dry Needling Feel Like?  The procedure feels different for each individual patient. Some patients report that they do not actually feel the needle enter the skin and overall the process is not painful. Very mild bleeding may occur. However, many patients feel a deep cramping in the muscle in which the needle was inserted. This is the local twitch response.   How Will I feel after the treatment? Soreness is normal, and the onset of soreness may not occur for a few hours. Typically this soreness does not last longer than two days.  Bruising is uncommon,  however; ice can be used to decrease any possible bruising.  In rare cases feeling tired or nauseous after the treatment is normal. In addition, your symptoms may get worse before they get better, this period will typically not last longer than 24 hours.   What Can I do After My Treatment? Increase your hydration by drinking more water  for the next 24 hours.  You may place ice or heat on the areas treated that have become sore, however, do not use heat on inflamed or bruised areas. Heat often brings more relief post needling. You can continue your regular activities, but vigorous activity is not recommended initially after the treatment for 24 hours. DN is best combined with other physical therapy such as strengthening, stretching, and other therapies.   What are the complications? While your therapist has had extensive training in minimizing the risks of trigger point dry needling, it is important to understand the risks of any procedure.  Risks include bleeding, pain, fatigue, hematoma, infection, vertigo, nausea or nerve involvement. Monitor for any changes to your skin or sensation. Contact your therapist or MD with concerns.  A rare but serious complication is a pneumothorax over or near your middle and upper chest and back If you have dry needling in this area, monitor for the following symptoms: Shortness of breath on exertion and/or Difficulty taking a deep breath and/or Chest Pain and/or A dry cough If any of the above symptoms develop, please go to the nearest emergency room or call 911. Tell them you had dry needling over your thorax and report any symptoms you are having. Please follow-up with your treating therapist after you complete the medical evaluation.                                       PATIENT EDUCATION:  Education details: Chin tucks, cervical extension. Person educated: Patient Education method: Medical Illustrator, handout. Education comprehension:  verbalized understanding  HOME EXERCISE PROGRAM: HOME EXERCISE PROGRAM [[id number]]  Scapular Retraction -  Repeat 15 Repetitions, Hold 2 Seconds, Complete 2 Sets, Perform 2 Times a Day  ASSESSMENT:  CLINICAL IMPRESSION: See below.  OBJECTIVE IMPAIRMENTS: decreased activity tolerance, decreased ROM, increased muscle spasms, postural dysfunction, and pain.   ACTIVITY LIMITATIONS: carrying, lifting, and sleeping  PARTICIPATION LIMITATIONS: meal prep, cleaning, and laundry  PERSONAL FACTORS: Time since onset of injury/illness/exacerbation and 1 comorbidity: ACDF are also affecting patient's functional outcome.   REHAB POTENTIAL: Good  CLINICAL DECISION MAKING: Stable/uncomplicated  EVALUATION COMPLEXITY: Low   GOALS:  SHORT TERM GOALS: Target date: 12/23/23  Ind with an initial HEP.  Goal status: MET   LONG TERM GOALS: Target date: 01/20/24  Ind with an advanced HEP.  Goal status: Partially met.  2.  Improve bilateral active  cervical rotation to 60 degrees so she can turn her head more easily while driving.   Goal status: RT 55 degrees, left is 50 degrees (12/31).    3.  Perform ADL's with pain not > 3/10. Goal status: MET.    4.  Sleep 6 hours undisturbed.  Goal status: MET.    5.  Improve NDI by at least 5 points.  Goal status: MET (5/50).  PLAN:  PT FREQUENCY: 2x/week  PT DURATION: 6 weeks  PLANNED INTERVENTIONS: 97110-Therapeutic exercises, 97530- Therapeutic activity, [id number]- Neuromuscular re-education, 97535- Self Care, [id number]- Manual therapy, [id number]- Electrical stimulation (unattended), 97035- Ultrasound, Patient/Family education, Cryotherapy, and Moist heat  PLAN FOR NEXT SESSION: Postural exercises, modalities and STW/M as needed.     PHYSICAL THERAPY DISCHARGE SUMMARY  Visits from Start of Care: 12.  Current functional level related to goals / functional outcomes: See above.     Remaining deficits: See goal section.     Education /  Equipment: HEP.   Patient agrees to discharge. Patient goals were partially met. Patient is being discharged due to being pleased with the current functional level.     Sherah Lund, PT 02/04/2024, 3:36 PM      "

## 2024-02-05 LAB — URINE CULTURE

## 2024-02-06 ENCOUNTER — Ambulatory Visit: Payer: Self-pay

## 2024-02-06 NOTE — Telephone Encounter (Signed)
 Appt scheduled on 1/16 at 11:15am with PCP.

## 2024-02-06 NOTE — Telephone Encounter (Signed)
 FYI Only or Action Required?: FYI only for provider: appointment scheduled on 02/07/24.  Patient was last seen in primary care on 02/03/2024 by Micheal Wolm ORN, MD.  Called Nurse Triage reporting Dysuria.  Symptoms began a week ago.  Interventions attempted: OTC medications: Tylenol .  Symptoms are: unchanged.  Triage Disposition: See Physician Within 24 Hours  Patient/caregiver understands and will follow disposition?: Yes    Has had 8/10 burning pain with urination and cloudy urine and pelvic pressure since last week. No blood. No fever. Started taking cipro  500 mg bid on Monday after being seen in office. Has not helped. Symptoms unchanged. Scheduled appt with PCP tomorrow. Advised to call back for worsening symptoms.      Copied from CRM #[id number]. Topic: Clinical - Medication Question >> Feb 06, 2024 10:14 AM Delon DASEN wrote: Reason for CRM: Still have pain upon urination, asking if she can get a refill on ciprofloxacin  (CIPRO ) 500 MG tablet or does she need to come in for a follow up- [phone] Reason for Disposition  [1] Taking antibiotic > 72 hours (3 days) for UTI AND [2] painful urination or frequency is SAME (unchanged, not better)  Answer Assessment - Initial Assessment Questions 1. MAIN SYMPTOM: What is the main symptom you are concerned about? (e.g., painful urination, urine frequency)     Pain with urination and cloudy urine and pelvic pressure  2. BETTER-SAME-WORSE: Are you getting better, staying the same, or getting worse compared to how you felt at your last visit to the doctor (most recent medical visit)?     The same/unchanged  3. PAIN: How bad is the pain?  (e.g., Scale 1-10; mild, moderate, or severe)     8/10 burning  4. FEVER: Do you have a fever? If Yes, ask: What is it, how was it measured, and when did it start?     Denies  5. OTHER SYMPTOMS: Do you have any other symptoms? (e.g., blood in the urine, flank pain, vaginal  discharge)     Denies  6. DIAGNOSIS: When was the UTI diagnosed? By whom? Was it a kidney infection, bladder infection or both?     1/12  7. ANTIBIOTIC: What antibiotic(s) are you taking? How many times per day?     Cipro , 500 mg bid  8. ANTIBIOTIC - START DATE: When did you start taking the antibiotic?     1/12  Protocols used: Urinary Tract Infection on Antibiotic Follow-up Call - [facility]

## 2024-02-07 ENCOUNTER — Ambulatory Visit: Payer: Self-pay | Admitting: Family Medicine

## 2024-02-07 ENCOUNTER — Ambulatory Visit: Admitting: Family Medicine

## 2024-02-07 ENCOUNTER — Encounter: Payer: Self-pay | Admitting: Family Medicine

## 2024-02-07 VITALS — BP 144/70 | HR 87 | Temp 98.2°F | Wt 235.9 lb

## 2024-02-07 DIAGNOSIS — N3000 Acute cystitis without hematuria: Secondary | ICD-10-CM

## 2024-02-07 LAB — URINE CULTURE
MICRO NUMBER:: 17456147
SPECIMEN QUALITY:: ADEQUATE

## 2024-02-07 MED ORDER — SULFAMETHOXAZOLE-TRIMETHOPRIM 800-160 MG PO TABS: 1.0000 | ORAL_TABLET | Freq: Two times a day (BID) | ORAL | 0 refills | Status: AC

## 2024-02-07 NOTE — Patient Instructions (Signed)
 STOP the Cipro - since the urine culture was resistant to this.    Start the Septra  twice daily and stay well hydrated.

## 2024-02-07 NOTE — Progress Notes (Signed)
 "  Established Patient Office Visit  Subjective   Patient ID: Dawn Salas, female    DOB: August 24, 1940  Age: 84 y.o. MRN: [id number]  Chief Complaint  Patient presents with   Dysuria    HPI    [name] had called yesterday with some persistent urinary symptoms.  Was seen recently and started on Cipro  while waiting for culture results.  Her culture did come back positive for E. coli last night but we were still waiting for susceptibilities.  This came back this morning with E. coli resistant to many antibiotics including Cipro  and Levaquin .  There is also resistance to cefazolin  and Augmentin.  She still has some cloudy urine and ongoing burning with urination.  Denies any fever, nausea, vomiting, or flank pain.  She had urine infection back in October which was Klebsiella.  Most recent GFR 37.  Past Medical History:  Diagnosis Date   Allergy    Anxiety    Arthritis    Asthma    patient denies    Cataract    bilateral lens implants    Colon polyps    Depression    Diabetes mellitus    Type two   Diverticulitis    Double vision 2019   left eye   Family history of adverse reaction to anesthesia    daughter- N/V    Family history of ovarian cancer    Family history of pancreatic cancer    Frequency of urination    GERD (gastroesophageal reflux disease)    History of adverse reaction to anesthesia 03/19/2022   After regional block for TKA pt states she had uncontrollable frequent blinking and zoned out was unable to speak more than one or two words for about 10 minutes   History of hiatal hernia    Hyperglycemia    Hyperlipidemia    Hypertension    Hyperthyroidism    PT HAD BIPOSY -NEGATIVE.SABRA AND NOW JUST GETS CHECKED EACH YEAR .SABRA 2013 LAST TEST   SEES DR. [name].    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 DOES NOT USE C PAP   Palpitations    Peptic ulcer disease    Pneumonia    walking pneumonia hx of    Seizures (HCC) 2015   one siezure with brain surgery none since 2015    Sleep apnea    no CPAP   Urinary tract infection 03/2020   Vertigo    Past Surgical History:  Procedure Laterality Date   ABDOMINAL HYSTERECTOMY     ANTERIOR CERVICAL DECOMP/DISCECTOMY FUSION N/A 04/19/2021   Procedure: Anterior Cervical Discectomy and Fusion with Interbody Prosthesis, Plates/Screws Cervical Three-Four/Cervical Four-Five REMoval CERVICAL PLATE;  Surgeon: Mavis Purchase, MD;  Location: [facility] OR;  Service: Neurosurgery;  Laterality: N/A;  3C   APPLICATION OF CRANIAL NAVIGATION Left 01/09/2023   Procedure: APPLICATION OF CRANIAL NAVIGATION;  Surgeon: Mavis Purchase, MD;  Location: [facility] OR;  Service: Neurosurgery;  Laterality: Left;   BACK SURGERY     L SPIN 2003   NECK 2004   BRAIN MENINGIOMA EXCISION  08/2008   X 2   C5-C6 neck fusion     CATARACT EXTRACTION Bilateral    COLONOSCOPY  05/05/2009   pt states multiple polyps removed in both colonoscopies she has had   CRANIOTOMY  08/13/2011   Procedure: CRANIOTOMY TUMOR EXCISION;  Surgeon: Purchase JONETTA Mavis, MD;  Location: [facility];  Service:  Neurosurgery;  Laterality: Bilateral;  Bifrontal craniotomy for tumor   CRANIOTOMY Left 01/09/2023   Procedure: FRONTAL CRANIOTOMY;  Surgeon: Mavis Purchase, MD;  Location: [facility] OR;  Service: Neurosurgery;  Laterality: Left;   DILATION AND CURETTAGE OF UTERUS     YEARS AGO   L4-L5 posterior la  2021   L3-L5 fusion   left foot plantar and hammertoe     right foot bunectomy     right knee arthroscopy     x 3   right shoulder rotator cuff     ROBOTIC ASSISTED BILATERAL SALPINGO OOPHERECTOMY N/A 08/25/2015   Procedure: XI ROBOTIC ASSISTED BILATERAL SALPINGO OOPHORECTOMY;  Surgeon: Sari Bachelor, MD;  Location: [facility];  Service: Gynecology;  Laterality: N/A;   TONSILLECTOMY     TOTAL KNEE ARTHROPLASTY Right 03/19/2022   Procedure: TOTAL KNEE ARTHROPLASTY;  Surgeon: Melodi Lerner, MD;  Location: [facility];   Service: Orthopedics;  Laterality: Right;   TUBAL LIGATION     WRIST SURGERY Right     reports that she has never smoked. She has never used smokeless tobacco. She reports that she does not drink alcohol  and does not use drugs. family history includes Anesthesia problems in her daughter; Arthritis in an other family member; Diabetes in her maternal grandmother and another family member; Heart disease (age of onset: 34) in her father; Hyperlipidemia in an other family member; Hypertension in an other family member; Ovarian cancer in her mother; Pancreatic cancer in her maternal aunt. Allergies[1]  Review of Systems  Constitutional:  Negative for chills and fever.  Gastrointestinal:  Negative for nausea and vomiting.  Genitourinary:  Positive for dysuria. Negative for flank pain and frequency.      Objective:     BP (!) 144/70   Pulse 87   Temp 98.2 F (36.8 C) (Oral)   Wt 235 lb 14.4 oz (107 kg)   SpO2 98%   BMI 40.49 kg/m  BP Readings from Last 3 Encounters:  02/07/24 (!) 144/70  02/03/24 (!) 160/74  01/06/24 132/60   Wt Readings from Last 3 Encounters:  02/07/24 235 lb 14.4 oz (107 kg)  02/03/24 234 lb 1.6 oz (106.2 kg)  01/06/24 234 lb 12.8 oz (106.5 kg)      Physical Exam Vitals reviewed.  Constitutional:      General: She is not in acute distress.    Appearance: She is not ill-appearing or toxic-appearing.  Cardiovascular:     Rate and Rhythm: Normal rate and regular rhythm.  Pulmonary:     Effort: Pulmonary effort is normal.     Breath sounds: Normal breath sounds. No wheezing or rales.  Neurological:     Mental Status: She is alert.      No results found for any visits on 02/07/24.    The ASCVD Risk score (Arnett DK, et al., 2019) failed to calculate for the following reasons:   The 2019 ASCVD risk score is only valid for ages 17 to 60   * - Cholesterol units were assumed    Assessment & Plan:   Urinary tract infection.  Patient currently on  Cipro  and her culture came back positive for E. coli resistant to Cipro  and several other medications as well.  She does have chronic kidney disease.  There are cautions with Macrobid with chronic kidney disease.  We also mentioned that we have to be careful with Septra .  She does have creatinine clearance over 30.  Will stop Cipro  and switch to Septra  DS 1  twice daily for 5 days.  Stay well-hydrated.  Follow-up for any fever or ongoing symptoms  Wolm Scarlet, MD     [1]  Allergies Allergen Reactions   Aspirin Effervescent Swelling    Alka-Seltzer gel caps- sore mouth, face swollen, turned hands white   Flagyl  [Metronidazole ] Other (See Comments)    Caused increased heart rate   Bupivacaine  Other (See Comments)    Eye Batting really fast, Could not speak normally, Words were long   Morphine  Sulfate Hives    Hallucinations   Penicillins Swelling and Rash    Previously tolerated cephalexin , cefazolin  03/19/22   "

## 2024-02-10 ENCOUNTER — Ambulatory Visit: Admitting: Family Medicine

## 2024-02-19 ENCOUNTER — Other Ambulatory Visit: Payer: Self-pay | Admitting: Family Medicine

## 2024-05-01 ENCOUNTER — Other Ambulatory Visit

## 2024-05-06 ENCOUNTER — Ambulatory Visit: Admitting: Endocrinology
